# Patient Record
Sex: Male | Born: 1950 | Race: White | Hispanic: No | Marital: Married | Smoking: Never smoker
Health system: Southern US, Community
[De-identification: ages and names within clinical notes are randomized; demographics above are authoritative.]

## PROBLEM LIST (undated history)

## (undated) DIAGNOSIS — E785 Hyperlipidemia, unspecified: Secondary | ICD-10-CM

## (undated) DIAGNOSIS — Z86718 Personal history of other venous thrombosis and embolism: Secondary | ICD-10-CM

## (undated) DIAGNOSIS — I429 Cardiomyopathy, unspecified: Secondary | ICD-10-CM

## (undated) DIAGNOSIS — I2510 Atherosclerotic heart disease of native coronary artery without angina pectoris: Secondary | ICD-10-CM

## (undated) DIAGNOSIS — K602 Anal fissure, unspecified: Secondary | ICD-10-CM

## (undated) DIAGNOSIS — E119 Type 2 diabetes mellitus without complications: Secondary | ICD-10-CM

## (undated) DIAGNOSIS — I10 Essential (primary) hypertension: Secondary | ICD-10-CM

## (undated) DIAGNOSIS — G629 Polyneuropathy, unspecified: Secondary | ICD-10-CM

## (undated) DIAGNOSIS — G4730 Sleep apnea, unspecified: Secondary | ICD-10-CM

## (undated) DIAGNOSIS — Z87898 Personal history of other specified conditions: Secondary | ICD-10-CM

## (undated) DIAGNOSIS — J45909 Unspecified asthma, uncomplicated: Secondary | ICD-10-CM

## (undated) DIAGNOSIS — I509 Heart failure, unspecified: Secondary | ICD-10-CM

## (undated) HISTORY — DX: Personal history of other specified conditions: Z87.898

## (undated) HISTORY — DX: Atherosclerotic heart disease of native coronary artery without angina pectoris: I25.10

## (undated) HISTORY — DX: Cardiomyopathy, unspecified: I42.9

## (undated) HISTORY — DX: Personal history of other venous thrombosis and embolism: Z86.718

## (undated) HISTORY — DX: Polyneuropathy, unspecified: G62.9

## (undated) HISTORY — DX: Hyperlipidemia, unspecified: E78.5

---

## 1999-11-12 ENCOUNTER — Encounter: Payer: Self-pay | Admitting: Ophthalmology

## 1999-11-12 ENCOUNTER — Ambulatory Visit (HOSPITAL_COMMUNITY): Admission: RE | Admit: 1999-11-12 | Discharge: 1999-11-12 | Payer: Self-pay | Admitting: Ophthalmology

## 2019-09-29 ENCOUNTER — Other Ambulatory Visit: Payer: Self-pay | Admitting: Family Medicine

## 2019-09-29 ENCOUNTER — Ambulatory Visit
Admission: RE | Admit: 2019-09-29 | Discharge: 2019-09-29 | Disposition: A | Discharge status: Home / Self Care | Payer: Medicare Other | Source: Ambulatory Visit | Attending: Family Medicine | Admitting: Family Medicine

## 2019-09-29 ENCOUNTER — Other Ambulatory Visit: Payer: Self-pay

## 2019-09-29 DIAGNOSIS — G459 Transient cerebral ischemic attack, unspecified: Secondary | ICD-10-CM

## 2019-10-05 ENCOUNTER — Other Ambulatory Visit: Payer: Self-pay | Admitting: Family Medicine

## 2019-10-05 ENCOUNTER — Other Ambulatory Visit: Payer: Self-pay

## 2019-10-05 DIAGNOSIS — R220 Localized swelling, mass and lump, head: Secondary | ICD-10-CM

## 2019-10-09 ENCOUNTER — Other Ambulatory Visit: Payer: Self-pay

## 2019-10-09 ENCOUNTER — Other Ambulatory Visit: Payer: Self-pay | Admitting: Family Medicine

## 2019-10-09 ENCOUNTER — Ambulatory Visit
Admission: RE | Admit: 2019-10-09 | Discharge: 2019-10-09 | Disposition: A | Discharge status: Home / Self Care | Payer: Medicare Other | Source: Ambulatory Visit | Attending: Family Medicine | Admitting: Family Medicine

## 2019-10-09 ENCOUNTER — Other Ambulatory Visit (HOSPITAL_COMMUNITY): Payer: Self-pay | Admitting: Family Medicine

## 2019-10-09 DIAGNOSIS — R220 Localized swelling, mass and lump, head: Secondary | ICD-10-CM

## 2019-10-13 ENCOUNTER — Other Ambulatory Visit: Payer: Self-pay

## 2019-10-13 ENCOUNTER — Ambulatory Visit (HOSPITAL_COMMUNITY)
Admission: RE | Admit: 2019-10-13 | Discharge: 2019-10-13 | Disposition: A | Discharge status: Home / Self Care | Payer: Medicare Other | Source: Ambulatory Visit | Attending: Family Medicine | Admitting: Family Medicine

## 2019-10-13 DIAGNOSIS — R220 Localized swelling, mass and lump, head: Secondary | ICD-10-CM

## 2019-10-13 MED ORDER — GADOBUTROL 1 MMOL/ML IV SOLN
10.0000 mL | Freq: Once | INTRAVENOUS | Status: AC | PRN
  Administered 2019-10-13: 10 mL via INTRAVENOUS

## 2020-12-12 ENCOUNTER — Inpatient Hospital Stay (HOSPITAL_BASED_OUTPATIENT_CLINIC_OR_DEPARTMENT_OTHER)
Admission: EM | Admit: 2020-12-12 | Discharge: 2020-12-23 | DRG: 853 | Disposition: A | Discharge status: Skilled Nursing Facility | Payer: Medicare Other | Attending: Internal Medicine | Admitting: Internal Medicine

## 2020-12-12 ENCOUNTER — Emergency Department (HOSPITAL_BASED_OUTPATIENT_CLINIC_OR_DEPARTMENT_OTHER): Payer: Medicare Other

## 2020-12-12 ENCOUNTER — Encounter (HOSPITAL_BASED_OUTPATIENT_CLINIC_OR_DEPARTMENT_OTHER): Payer: Self-pay | Admitting: Emergency Medicine

## 2020-12-12 ENCOUNTER — Other Ambulatory Visit: Payer: Self-pay

## 2020-12-12 DIAGNOSIS — I4891 Unspecified atrial fibrillation: Secondary | ICD-10-CM | POA: Diagnosis present

## 2020-12-12 DIAGNOSIS — B379 Candidiasis, unspecified: Secondary | ICD-10-CM | POA: Diagnosis present

## 2020-12-12 DIAGNOSIS — Z86711 Personal history of pulmonary embolism: Secondary | ICD-10-CM

## 2020-12-12 DIAGNOSIS — G4733 Obstructive sleep apnea (adult) (pediatric): Secondary | ICD-10-CM | POA: Diagnosis present

## 2020-12-12 DIAGNOSIS — K7581 Nonalcoholic steatohepatitis (NASH): Secondary | ICD-10-CM | POA: Diagnosis present

## 2020-12-12 DIAGNOSIS — R7989 Other specified abnormal findings of blood chemistry: Secondary | ICD-10-CM

## 2020-12-12 DIAGNOSIS — B961 Klebsiella pneumoniae [K. pneumoniae] as the cause of diseases classified elsewhere: Secondary | ICD-10-CM | POA: Diagnosis present

## 2020-12-12 DIAGNOSIS — K8309 Other cholangitis: Secondary | ICD-10-CM

## 2020-12-12 DIAGNOSIS — E871 Hypo-osmolality and hyponatremia: Secondary | ICD-10-CM | POA: Diagnosis present

## 2020-12-12 DIAGNOSIS — E875 Hyperkalemia: Secondary | ICD-10-CM | POA: Diagnosis present

## 2020-12-12 DIAGNOSIS — Z880 Allergy status to penicillin: Secondary | ICD-10-CM

## 2020-12-12 DIAGNOSIS — E1143 Type 2 diabetes mellitus with diabetic autonomic (poly)neuropathy: Secondary | ICD-10-CM | POA: Diagnosis present

## 2020-12-12 DIAGNOSIS — R933 Abnormal findings on diagnostic imaging of other parts of digestive tract: Secondary | ICD-10-CM

## 2020-12-12 DIAGNOSIS — Z993 Dependence on wheelchair: Secondary | ICD-10-CM

## 2020-12-12 DIAGNOSIS — A419 Sepsis, unspecified organism: Secondary | ICD-10-CM

## 2020-12-12 DIAGNOSIS — K219 Gastro-esophageal reflux disease without esophagitis: Secondary | ICD-10-CM | POA: Diagnosis present

## 2020-12-12 DIAGNOSIS — K5900 Constipation, unspecified: Secondary | ICD-10-CM | POA: Diagnosis not present

## 2020-12-12 DIAGNOSIS — Z794 Long term (current) use of insulin: Secondary | ICD-10-CM

## 2020-12-12 DIAGNOSIS — J45909 Unspecified asthma, uncomplicated: Secondary | ICD-10-CM | POA: Diagnosis present

## 2020-12-12 DIAGNOSIS — E119 Type 2 diabetes mellitus without complications: Secondary | ICD-10-CM

## 2020-12-12 DIAGNOSIS — Z8616 Personal history of COVID-19: Secondary | ICD-10-CM

## 2020-12-12 DIAGNOSIS — I5032 Chronic diastolic (congestive) heart failure: Secondary | ICD-10-CM | POA: Diagnosis present

## 2020-12-12 DIAGNOSIS — Z87891 Personal history of nicotine dependence: Secondary | ICD-10-CM

## 2020-12-12 DIAGNOSIS — E876 Hypokalemia: Secondary | ICD-10-CM | POA: Diagnosis not present

## 2020-12-12 DIAGNOSIS — E1142 Type 2 diabetes mellitus with diabetic polyneuropathy: Secondary | ICD-10-CM | POA: Diagnosis present

## 2020-12-12 DIAGNOSIS — Z7901 Long term (current) use of anticoagulants: Secondary | ICD-10-CM

## 2020-12-12 DIAGNOSIS — N400 Enlarged prostate without lower urinary tract symptoms: Secondary | ICD-10-CM | POA: Diagnosis present

## 2020-12-12 DIAGNOSIS — A4159 Other Gram-negative sepsis: Principal | ICD-10-CM | POA: Diagnosis present

## 2020-12-12 DIAGNOSIS — K8070 Calculus of gallbladder and bile duct without cholecystitis without obstruction: Secondary | ICD-10-CM | POA: Diagnosis present

## 2020-12-12 DIAGNOSIS — R269 Unspecified abnormalities of gait and mobility: Secondary | ICD-10-CM | POA: Diagnosis present

## 2020-12-12 DIAGNOSIS — J9601 Acute respiratory failure with hypoxia: Secondary | ICD-10-CM | POA: Diagnosis present

## 2020-12-12 DIAGNOSIS — Z886 Allergy status to analgesic agent status: Secondary | ICD-10-CM

## 2020-12-12 DIAGNOSIS — R059 Cough, unspecified: Secondary | ICD-10-CM

## 2020-12-12 DIAGNOSIS — I130 Hypertensive heart and chronic kidney disease with heart failure and stage 1 through stage 4 chronic kidney disease, or unspecified chronic kidney disease: Secondary | ICD-10-CM | POA: Diagnosis present

## 2020-12-12 DIAGNOSIS — Z888 Allergy status to other drugs, medicaments and biological substances status: Secondary | ICD-10-CM

## 2020-12-12 DIAGNOSIS — K589 Irritable bowel syndrome without diarrhea: Secondary | ICD-10-CM | POA: Diagnosis present

## 2020-12-12 DIAGNOSIS — E1122 Type 2 diabetes mellitus with diabetic chronic kidney disease: Secondary | ICD-10-CM | POA: Diagnosis present

## 2020-12-12 DIAGNOSIS — E1165 Type 2 diabetes mellitus with hyperglycemia: Secondary | ICD-10-CM | POA: Diagnosis present

## 2020-12-12 DIAGNOSIS — I959 Hypotension, unspecified: Secondary | ICD-10-CM | POA: Diagnosis not present

## 2020-12-12 DIAGNOSIS — E785 Hyperlipidemia, unspecified: Secondary | ICD-10-CM | POA: Diagnosis present

## 2020-12-12 DIAGNOSIS — Z6838 Body mass index (BMI) 38.0-38.9, adult: Secondary | ICD-10-CM

## 2020-12-12 DIAGNOSIS — N1831 Chronic kidney disease, stage 3a: Secondary | ICD-10-CM | POA: Diagnosis present

## 2020-12-12 DIAGNOSIS — B3749 Other urogenital candidiasis: Secondary | ICD-10-CM | POA: Diagnosis present

## 2020-12-12 DIAGNOSIS — R6520 Severe sepsis without septic shock: Secondary | ICD-10-CM | POA: Diagnosis present

## 2020-12-12 DIAGNOSIS — N179 Acute kidney failure, unspecified: Secondary | ICD-10-CM | POA: Diagnosis present

## 2020-12-12 DIAGNOSIS — K429 Umbilical hernia without obstruction or gangrene: Secondary | ICD-10-CM | POA: Diagnosis present

## 2020-12-12 DIAGNOSIS — Z20822 Contact with and (suspected) exposure to covid-19: Secondary | ICD-10-CM | POA: Diagnosis present

## 2020-12-12 HISTORY — DX: Unspecified asthma, uncomplicated: J45.909

## 2020-12-12 HISTORY — DX: Type 2 diabetes mellitus without complications: E11.9

## 2020-12-12 HISTORY — DX: Essential (primary) hypertension: I10

## 2020-12-12 LAB — COMPREHENSIVE METABOLIC PANEL
ALT: 199 U/L — ABNORMAL HIGH (ref 0–44)
AST: 188 U/L — ABNORMAL HIGH (ref 15–41)
Albumin: 2.9 g/dL — ABNORMAL LOW (ref 3.5–5.0)
Alkaline Phosphatase: 74 U/L (ref 38–126)
Anion gap: 12 (ref 5–15)
BUN: 27 mg/dL — ABNORMAL HIGH (ref 8–23)
CO2: 22 mmol/L (ref 22–32)
Calcium: 9 mg/dL (ref 8.9–10.3)
Chloride: 97 mmol/L — ABNORMAL LOW (ref 98–111)
Creatinine, Ser: 1.57 mg/dL — ABNORMAL HIGH (ref 0.61–1.24)
GFR, Estimated: 47 mL/min — ABNORMAL LOW (ref >60)
Glucose, Bld: 278 mg/dL — ABNORMAL HIGH (ref 70–99)
Potassium: 4.6 mmol/L (ref 3.5–5.1)
Sodium: 131 mmol/L — ABNORMAL LOW (ref 135–145)
Total Bilirubin: 4.1 mg/dL — ABNORMAL HIGH (ref 0.3–1.2)
Total Protein: 6.8 g/dL (ref 6.5–8.1)

## 2020-12-12 LAB — CBC WITH DIFFERENTIAL/PLATELET
Abs Immature Granulocytes: 0.02 10*3/uL (ref 0.00–0.07)
Basophils Absolute: 0 10*3/uL (ref 0.0–0.1)
Basophils Relative: 0 %
Eosinophils Absolute: 0.1 10*3/uL (ref 0.0–0.5)
Eosinophils Relative: 1 %
HCT: 46.3 % (ref 39.0–52.0)
Hemoglobin: 14.9 g/dL (ref 13.0–17.0)
Immature Granulocytes: 0 %
Lymphocytes Relative: 8 %
Lymphs Abs: 0.6 10*3/uL — ABNORMAL LOW (ref 0.7–4.0)
MCH: 31.4 pg (ref 26.0–34.0)
MCHC: 32.2 g/dL (ref 30.0–36.0)
MCV: 97.5 fL (ref 80.0–100.0)
Monocytes Absolute: 0.2 10*3/uL (ref 0.1–1.0)
Monocytes Relative: 3 %
Neutro Abs: 5.7 10*3/uL (ref 1.7–7.7)
Neutrophils Relative %: 88 %
Platelets: 198 10*3/uL (ref 150–400)
RBC: 4.75 MIL/uL (ref 4.22–5.81)
RDW: 16.3 % — ABNORMAL HIGH (ref 11.5–15.5)
WBC: 6.5 10*3/uL (ref 4.0–10.5)
nRBC: 0 % (ref 0.0–0.2)

## 2020-12-12 LAB — LACTIC ACID, PLASMA
Lactic Acid, Venous: 2.3 mmol/L (ref 0.5–1.9)
Lactic Acid, Venous: 2.2 mmol/L (ref 0.5–1.9)

## 2020-12-12 LAB — PROTIME-INR
INR: 1.2 (ref 0.8–1.2)
Prothrombin Time: 14.9 seconds (ref 11.4–15.2)

## 2020-12-12 LAB — RESP PANEL BY RT-PCR (FLU A&B, COVID) ARPGX2
Influenza A by PCR: NEGATIVE
Influenza B by PCR: NEGATIVE
SARS Coronavirus 2 by RT PCR: NEGATIVE

## 2020-12-12 LAB — LIPASE, BLOOD: Lipase: 25 U/L (ref 11–51)

## 2020-12-12 MED ORDER — METRONIDAZOLE IN NACL 5-0.79 MG/ML-% IV SOLN
500.0000 mg | Freq: Once | INTRAVENOUS | Status: AC
  Administered 2020-12-13: 500 mg via INTRAVENOUS
  Filled 2020-12-12: qty 100

## 2020-12-12 MED ORDER — SODIUM CHLORIDE 0.9 % IV SOLN
2.0000 g | Freq: Two times a day (BID) | INTRAVENOUS | Status: DC
  Administered 2020-12-12 – 2020-12-14 (×5): 2 g via INTRAVENOUS
  Filled 2020-12-12 (×5): qty 2

## 2020-12-12 MED ORDER — ACETAMINOPHEN 650 MG RE SUPP
650.0000 mg | Freq: Once | RECTAL | Status: AC
  Administered 2020-12-12: 650 mg via RECTAL
  Filled 2020-12-12: qty 1

## 2020-12-12 MED ORDER — VANCOMYCIN HCL IN DEXTROSE 1-5 GM/200ML-% IV SOLN
1000.0000 mg | Freq: Once | INTRAVENOUS | Status: AC
  Administered 2020-12-12: 1000 mg via INTRAVENOUS
  Filled 2020-12-12: qty 200

## 2020-12-12 MED ORDER — VANCOMYCIN HCL 1500 MG/300ML IV SOLN
1500.0000 mg | INTRAVENOUS | Status: DC
  Filled 2020-12-12: qty 300

## 2020-12-12 MED ORDER — IOHEXOL 300 MG/ML  SOLN
100.0000 mL | Freq: Once | INTRAMUSCULAR | Status: AC | PRN
  Administered 2020-12-12: 100 mL via INTRAVENOUS

## 2020-12-12 MED ORDER — ONDANSETRON HCL 4 MG/2ML IJ SOLN
4.0000 mg | Freq: Once | INTRAMUSCULAR | Status: DC
  Filled 2020-12-12: qty 2

## 2020-12-12 MED ORDER — SODIUM CHLORIDE 0.9 % IV BOLUS
1000.0000 mL | Freq: Once | INTRAVENOUS | Status: AC
  Administered 2020-12-12: 1000 mL via INTRAVENOUS

## 2020-12-12 MED ORDER — SODIUM CHLORIDE 0.9 % IV SOLN: 2.0000 g | Freq: Two times a day (BID) | INTRAVENOUS | Status: DC

## 2020-12-12 NOTE — ED Notes (Signed)
Gave patient ice water; instructed to take small sips.

## 2020-12-12 NOTE — ED Provider Notes (Signed)
[facility] EMERGENCY DEPARTMENT Provider Note   CSN: [id number] Arrival date & time: 12/12/20  1932     History Chief Complaint  Patient presents with  . Abdominal Pain    Randy Liu is a 70 y.o. male.  Patient with history of gastroparesis, HTN, chronic kidney disease stage III, BPH, gout, obstructive sleep apnea, PE on Eliquis, retinal reattachment, cataract surgery --presents to the emergency department today for abdominal pain, nausea and vomiting started on 12/10/2020.  Patient has had frequent vomiting since that point including with EMS and upon arrival to the emergency department.  He was given 4 mg of Zofran in route.  Patient was found to have oxygen saturation into the 80s and was placed on 2 L nasal cannula.  No reported fevers however patient febrile on arrival to the emergency department.  He is actively coughing.  Wife confirms history, states that his urine was a bit dark 2 days ago.        Past Medical History:  Diagnosis Date  . Asthma   . Diabetes mellitus without complication (HCC)   . Hypertension     There are no problems to display for this patient.   History reviewed. No pertinent surgical history.     No family history on file.  Social History   Tobacco Use  . Smoking status: Former Games developermoker  . Smokeless tobacco: Never Used  Substance Use Topics  . Alcohol use: Not Currently  . Drug use: Never    Home Medications Prior to Admission medications   Not on File    Allergies    Metoclopramide and Allopurinol  Review of Systems   Review of Systems  Constitutional: Positive for chills and fever.  HENT: Negative for rhinorrhea and sore throat.   Eyes: Negative for redness.  Respiratory: Positive for cough and shortness of breath.   Cardiovascular: Negative for chest pain.  Gastrointestinal: Positive for abdominal pain, nausea and vomiting. Negative for diarrhea.  Genitourinary: Negative for dysuria and hematuria.   Musculoskeletal: Negative for myalgias.  Skin: Negative for rash.  Neurological: Negative for headaches.    Physical Exam Updated Vital Signs BP (!) 132/95 (BP Location: Left Arm)   Pulse (!) 101   Temp 98.7 F (37.1 C) (Oral)   Resp (!) 24   Ht 6\' 1"  (1.854 m)   Wt 123.8 kg   SpO2 97%   BMI 36.02 kg/m   Physical Exam Vitals and nursing note reviewed.  Constitutional:      Appearance: He is well-developed. He is ill-appearing.     Comments: Actively retching at time of initial exam.  HENT:     Head: Normocephalic and atraumatic.     Mouth/Throat:     Pharynx: Oropharynx is clear.  Eyes:     General:        Right eye: No discharge.        Left eye: No discharge.     Conjunctiva/sclera: Conjunctivae normal.  Cardiovascular:     Rate and Rhythm: Regular rhythm. Tachycardia present.     Heart sounds: Normal heart sounds.  Pulmonary:     Effort: Pulmonary effort is normal.     Breath sounds: Normal breath sounds.  Abdominal:     Palpations: Abdomen is soft.     Tenderness: There is generalized abdominal tenderness. There is no guarding or rebound.     Hernia: A hernia (Soft ventral hernia easily reducible) is present. Hernia is present in the ventral area.  Comments: Patient with generalized abdominal tenderness to palpation.  No focal right upper quadrant pain.  No rebound or guarding.  Musculoskeletal:     Cervical back: Normal range of motion and neck supple.  Skin:    General: Skin is warm and dry.  Neurological:     General: No focal deficit present.     Mental Status: He is alert and oriented to person, place, and time.     Comments: Patient awake, alert, conversant.     ED Results / Procedures / Treatments   Labs (all labs ordered are listed, but only abnormal results are displayed) Labs Reviewed  COMPREHENSIVE METABOLIC PANEL - Abnormal; Notable for the following components:      Result Value   Sodium 131 (*)    Chloride 97 (*)    Glucose, Bld 278  (*)    BUN 27 (*)    Creatinine, Ser 1.57 (*)    Albumin 2.9 (*)    AST 188 (*)    ALT 199 (*)    Total Bilirubin 4.1 (*)    GFR, Estimated 47 (*)    All other components within normal limits  LACTIC ACID, PLASMA - Abnormal; Notable for the following components:   Lactic Acid, Venous 2.3 (*)    All other components within normal limits  LACTIC ACID, PLASMA - Abnormal; Notable for the following components:   Lactic Acid, Venous 2.2 (*)    All other components within normal limits  CBC WITH DIFFERENTIAL/PLATELET - Abnormal; Notable for the following components:   RDW 16.3 (*)    Lymphs Abs 0.6 (*)    All other components within normal limits  RESP PANEL BY RT-PCR (FLU A&B, COVID) ARPGX2  CULTURE, BLOOD (ROUTINE X 2)  CULTURE, BLOOD (ROUTINE X 2)  URINE CULTURE  PROTIME-INR  LIPASE, BLOOD  URINALYSIS, ROUTINE W REFLEX MICROSCOPIC    EKG EKG Interpretation  Date/Time:  Thursday December 12 2020 20:06:54 EDT Ventricular Rate:  108 PR Interval:  150 QRS Duration: 106 QT Interval:  372 QTC Calculation: 499 R Axis:   -71 Text Interpretation: Sinus tachycardia Inferior infarct, old Consider anterior infarct Baseline wander in lead(s) V1 V2 V3 V4 V5 V6 Confirmed by Marianna Fuss ([id number]) on 12/12/2020 11:25:17 PM   Radiology DG Chest 2 View  Result Date: 12/12/2020 CLINICAL DATA:  Abdominal pain for several days EXAM: CHEST - 2 VIEW COMPARISON:  02/29/2020 FINDINGS: Cardiac shadow is enlarged but stable. Aortic calcifications are noted. 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 Lungs are clear. No bony abnormality is noted. 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 IMPRESSION: No acute abnormality seen. Electronically Signed   By: Alcide Clever M.D.   On: 12/12/2020 20:58   CT ABDOMEN PELVIS W CONTRAST  Result Date: 12/12/2020 CLINICAL DATA:  Generalized abdominal pain for several days EXAM:  CT ABDOMEN AND PELVIS WITH CONTRAST TECHNIQUE: Multidetector CT imaging of the abdomen and pelvis was performed using the standard protocol following bolus administration of intravenous contrast. CONTRAST:  OMNIPAQUE IOHEXOL 300 MG/ML  SOLN COMPARISON:  06/10/2019 FINDINGS: Lower chest: No acute abnormality. Hepatobiliary: Fatty infiltration of the liver is noted. The gallbladder is within normal limits. No biliary ductal dilatation is seen. Pancreas: Unremarkable. No pancreatic ductal dilatation or surrounding inflammatory changes. Spleen: Normal in size without focal abnormality. Adrenals/Urinary Tract: Adrenal glands are within normal limits. Kidneys demonstrate a normal enhancement pattern. Normal  excretion of contrast is noted bilaterally. Large exophytic cyst is noted arising from the lower pole of the left kidney measuring 8.1 cm stable in appearance from the prior exam. No renal calculi or obstructive changes are seen. Ureters are within normal limits. The bladder is well distended. Stomach/Bowel: The appendix is within normal limits. No obstructive or inflammatory changes are noted within the colon. The small bowel and stomach are within normal limits. Vascular/Lymphatic: Aortic atherosclerosis. No enlarged abdominal or pelvic lymph nodes. Reproductive: Prostate is unremarkable. Other: Small fat containing umbilical hernia is again seen. No abdominopelvic ascites. Musculoskeletal: Degenerative changes of lumbar spine are noted. IMPRESSION: Fatty liver Stable renal cysts. No acute abnormality noted. Electronically Signed   By: Alcide Clever M.D.   On: 12/12/2020 20:54    Procedures Procedures   Medications Ordered in ED Medications  ondansetron (ZOFRAN) injection 4 mg (has no administration in time range)  vancomycin (VANCOCIN) IVPB 1000 mg/200 mL premix (0 mg Intravenous Stopped 12/12/20 2225)    Followed by  vancomycin (VANCOCIN) IVPB 1000 mg/200 mL premix (1,000 mg Intravenous New Bag/Given  12/12/20 2248)  ceFEPIme (MAXIPIME) 2 g in sodium chloride 0.9 % 100 mL IVPB (2 g Intravenous New Bag/Given 12/12/20 2259)  vancomycin (VANCOREADY) IVPB 1500 mg/300 mL (has no administration in time range)  metroNIDAZOLE (FLAGYL) IVPB 500 mg (has no administration in time range)  acetaminophen (TYLENOL) suppository 650 mg (650 mg Rectal Given 12/12/20 2011)  iohexol (OMNIPAQUE) 300 MG/ML solution 100 mL (100 mLs Intravenous Contrast Given 12/12/20 2023)  sodium chloride 0.9 % bolus 1,000 mL (0 mLs Intravenous Stopped 12/12/20 2210)    ED Course  I have reviewed the triage vital signs and the nursing notes.  Pertinent labs & imaging results that were available during my care of the patient were reviewed by me and considered in my medical decision making (see chart for details).  Patient seen and examined. Work-up initiated.  Concern for sepsis given initial vital signs and temperature.  Rectal temperature 102.3 F.  Patient actively retching.  Broad-spectrum antibiotics ordered.  CT imaging of the chest and chest x-ray ordered as well as sepsis labs.  EKG reviewed.  Vital signs reviewed and are as follows: BP 129/61   Pulse (!) 109   Temp (!) 102.3 F (39.1 C) (Rectal)   Resp (!) 32   Ht 6\' 1"  (1.854 m)   Wt 123.8 kg   SpO2 97%   BMI 36.02 kg/m   Imaging reviewed.  Overall reassuring.  Mildly elevated lactate.  Treated with IV fluids.  No 30 cc/kg bolus due to MAP greater than 65 and lactate less than 4.  Patient continues to look uncomfortable but reports improved pain and nausea.  10:35 PM Spoke with Dr. regarding admission. Requests addition of Flagyl to cover for possibility of cholangitis.  Plan for admission to stepdown bed.  CRITICAL CARE Performed by: Robb Matar PA-C Total critical care time: 40 minutes Critical care time was exclusive of separately billable procedures and treating other patients. Critical care was necessary to treat or prevent imminent or  life-threatening deterioration. Critical care was time spent personally by me on the following activities: development of treatment plan with patient and/or surrogate as well as nursing, discussions with consultants, evaluation of patient's response to treatment, examination of patient, obtaining history from patient or surrogate, ordering and performing treatments and interventions, ordering and review of laboratory studies, ordering and review of radiographic studies, pulse oximetry and re-evaluation of patient's condition.  MDM Rules/Calculators/A&P                          Admit with concern for sepsis.  Liver function testing abnormal and may need further evaluation and trending.  Final Clinical Impression(s) / ED Diagnoses Final diagnoses:  Sepsis with acute organ dysfunction without septic shock, due to unspecified organism, unspecified type [facility])    Rx / DC Orders ED Discharge Orders    None       Renne Crigler, PA-C 12/12/20 2325    Milagros Loll, MD 12/13/20 [phone]

## 2020-12-12 NOTE — ED Notes (Signed)
Patient transported to CT 

## 2020-12-12 NOTE — Progress Notes (Signed)
Patient is compliant with CPAP and he wears it every night.  Patient's CPAP settings are 11 cm H2O pressure with heated humidity and nasal pillows.  Per patient's wife her husband wears nasal pillows because there were leak problems with nasal masks.

## 2020-12-12 NOTE — Progress Notes (Signed)
Pharmacy Antibiotic Note  Randy Liu is a 70 y.o. male admitted on 12/12/2020 presenting with abdominal pain and concern for sepsis, febrile, tachy, LA 2.3.  Pharmacy has been consulted for vancomycin and cefepime dosing.  Hx CKD3, unknown baseline  Plan: Vancomycin 2000 mg IV x 1, then 1500 mg IV every 24 hours (eAUC 539, Goal AUC 400-550, SCr 1.57) Cefepime 2g IV every 12 hours Monitor renal function, Cx and clinical progression to narrow Vancomycin levels as indicated  Height: 6\' 1"  (185.4 cm) Weight: 123.8 kg (273 lb) IBW/kg (Calculated) : 79.9  Temp (24hrs), Avg:100.5 F (38.1 C), Min:98.7 F (37.1 C), Max:102.3 F (39.1 C)  Recent Labs  Lab 12/12/20 1940  WBC 6.5  CREATININE 1.57*  LATICACIDVEN 2.3*    Estimated Creatinine Clearance: 59.5 mL/min (A) (by C-G formula based on SCr of 1.57 mg/dL (H)).    Allergies  Allergen Reactions  . Metoclopramide Itching    Loss of Balance; Urinary Incontinence  . Allopurinol Rash    12/14/20, PharmD Clinical Pharmacist ED Pharmacist Phone # [phone] 12/12/2020 8:25 PM

## 2020-12-12 NOTE — ED Triage Notes (Signed)
Patient arrived via [facility] c/o abdominal pain x 3 days with n/v x 2 days. Patient states abdominal pain started post dinner on 12/10/20. Per EMS patient continuous emesis since. Patient provided 4 mg zofran by EMS with no relief. Patient is AO x 4, VS w/ decreased spo2 on 2L [place], unable to stand at this time.

## 2020-12-13 ENCOUNTER — Inpatient Hospital Stay (HOSPITAL_COMMUNITY): Payer: Medicare Other

## 2020-12-13 ENCOUNTER — Encounter (HOSPITAL_COMMUNITY): Payer: Self-pay | Admitting: Internal Medicine

## 2020-12-13 DIAGNOSIS — J45909 Unspecified asthma, uncomplicated: Secondary | ICD-10-CM | POA: Diagnosis present

## 2020-12-13 DIAGNOSIS — R6520 Severe sepsis without septic shock: Secondary | ICD-10-CM | POA: Diagnosis present

## 2020-12-13 DIAGNOSIS — R7881 Bacteremia: Secondary | ICD-10-CM

## 2020-12-13 DIAGNOSIS — K589 Irritable bowel syndrome without diarrhea: Secondary | ICD-10-CM | POA: Diagnosis present

## 2020-12-13 DIAGNOSIS — I5021 Acute systolic (congestive) heart failure: Secondary | ICD-10-CM | POA: Diagnosis not present

## 2020-12-13 DIAGNOSIS — E1140 Type 2 diabetes mellitus with diabetic neuropathy, unspecified: Secondary | ICD-10-CM | POA: Diagnosis not present

## 2020-12-13 DIAGNOSIS — Z87891 Personal history of nicotine dependence: Secondary | ICD-10-CM | POA: Diagnosis not present

## 2020-12-13 DIAGNOSIS — N1832 Chronic kidney disease, stage 3b: Secondary | ICD-10-CM

## 2020-12-13 DIAGNOSIS — Z20822 Contact with and (suspected) exposure to covid-19: Secondary | ICD-10-CM | POA: Diagnosis present

## 2020-12-13 DIAGNOSIS — Z794 Long term (current) use of insulin: Secondary | ICD-10-CM | POA: Diagnosis not present

## 2020-12-13 DIAGNOSIS — I130 Hypertensive heart and chronic kidney disease with heart failure and stage 1 through stage 4 chronic kidney disease, or unspecified chronic kidney disease: Secondary | ICD-10-CM | POA: Diagnosis present

## 2020-12-13 DIAGNOSIS — N179 Acute kidney failure, unspecified: Secondary | ICD-10-CM | POA: Diagnosis present

## 2020-12-13 DIAGNOSIS — K429 Umbilical hernia without obstruction or gangrene: Secondary | ICD-10-CM | POA: Diagnosis present

## 2020-12-13 DIAGNOSIS — E871 Hypo-osmolality and hyponatremia: Secondary | ICD-10-CM

## 2020-12-13 DIAGNOSIS — I9589 Other hypotension: Secondary | ICD-10-CM

## 2020-12-13 DIAGNOSIS — K8309 Other cholangitis: Secondary | ICD-10-CM

## 2020-12-13 DIAGNOSIS — B3749 Other urogenital candidiasis: Secondary | ICD-10-CM | POA: Diagnosis present

## 2020-12-13 DIAGNOSIS — J4520 Mild intermittent asthma, uncomplicated: Secondary | ICD-10-CM

## 2020-12-13 DIAGNOSIS — R7989 Other specified abnormal findings of blood chemistry: Secondary | ICD-10-CM

## 2020-12-13 DIAGNOSIS — J9601 Acute respiratory failure with hypoxia: Secondary | ICD-10-CM | POA: Diagnosis present

## 2020-12-13 DIAGNOSIS — I5032 Chronic diastolic (congestive) heart failure: Secondary | ICD-10-CM | POA: Diagnosis present

## 2020-12-13 DIAGNOSIS — N1831 Chronic kidney disease, stage 3a: Secondary | ICD-10-CM | POA: Diagnosis present

## 2020-12-13 DIAGNOSIS — A4159 Other Gram-negative sepsis: Secondary | ICD-10-CM | POA: Diagnosis present

## 2020-12-13 DIAGNOSIS — Z8616 Personal history of COVID-19: Secondary | ICD-10-CM | POA: Diagnosis not present

## 2020-12-13 DIAGNOSIS — I4891 Unspecified atrial fibrillation: Secondary | ICD-10-CM | POA: Diagnosis present

## 2020-12-13 DIAGNOSIS — R9431 Abnormal electrocardiogram [ECG] [EKG]: Secondary | ICD-10-CM

## 2020-12-13 DIAGNOSIS — A419 Sepsis, unspecified organism: Secondary | ICD-10-CM

## 2020-12-13 DIAGNOSIS — Z86711 Personal history of pulmonary embolism: Secondary | ICD-10-CM | POA: Diagnosis not present

## 2020-12-13 DIAGNOSIS — K8050 Calculus of bile duct without cholangitis or cholecystitis without obstruction: Secondary | ICD-10-CM | POA: Diagnosis not present

## 2020-12-13 DIAGNOSIS — I10 Essential (primary) hypertension: Secondary | ICD-10-CM

## 2020-12-13 DIAGNOSIS — E119 Type 2 diabetes mellitus without complications: Secondary | ICD-10-CM

## 2020-12-13 DIAGNOSIS — Z7901 Long term (current) use of anticoagulants: Secondary | ICD-10-CM | POA: Diagnosis not present

## 2020-12-13 DIAGNOSIS — R0602 Shortness of breath: Secondary | ICD-10-CM

## 2020-12-13 DIAGNOSIS — I959 Hypotension, unspecified: Secondary | ICD-10-CM | POA: Diagnosis not present

## 2020-12-13 DIAGNOSIS — N170 Acute kidney failure with tubular necrosis: Secondary | ICD-10-CM

## 2020-12-13 DIAGNOSIS — K8070 Calculus of gallbladder and bile duct without cholecystitis without obstruction: Secondary | ICD-10-CM | POA: Diagnosis present

## 2020-12-13 DIAGNOSIS — N400 Enlarged prostate without lower urinary tract symptoms: Secondary | ICD-10-CM | POA: Diagnosis present

## 2020-12-13 DIAGNOSIS — E1143 Type 2 diabetes mellitus with diabetic autonomic (poly)neuropathy: Secondary | ICD-10-CM | POA: Diagnosis present

## 2020-12-13 DIAGNOSIS — E875 Hyperkalemia: Secondary | ICD-10-CM | POA: Diagnosis present

## 2020-12-13 DIAGNOSIS — G4733 Obstructive sleep apnea (adult) (pediatric): Secondary | ICD-10-CM | POA: Diagnosis present

## 2020-12-13 LAB — CBC
HCT: 42.1 % (ref 39.0–52.0)
Hemoglobin: 13.3 g/dL (ref 13.0–17.0)
MCH: 31.7 pg (ref 26.0–34.0)
MCHC: 31.6 g/dL (ref 30.0–36.0)
MCV: 100.2 fL — ABNORMAL HIGH (ref 80.0–100.0)
Platelets: 177 10*3/uL (ref 150–400)
RBC: 4.2 MIL/uL — ABNORMAL LOW (ref 4.22–5.81)
RDW: 16.8 % — ABNORMAL HIGH (ref 11.5–15.5)
WBC: 12.6 10*3/uL — ABNORMAL HIGH (ref 4.0–10.5)
nRBC: 0 % (ref 0.0–0.2)

## 2020-12-13 LAB — BLOOD CULTURE ID PANEL (REFLEXED) - BCID2

## 2020-12-13 LAB — BASIC METABOLIC PANEL
Anion gap: 14 (ref 5–15)
BUN: 30 mg/dL — ABNORMAL HIGH (ref 8–23)
CO2: 19 mmol/L — ABNORMAL LOW (ref 22–32)
Calcium: 8.8 mg/dL — ABNORMAL LOW (ref 8.9–10.3)
Chloride: 99 mmol/L (ref 98–111)
Creatinine, Ser: 1.81 mg/dL — ABNORMAL HIGH (ref 0.61–1.24)
GFR, Estimated: 39 mL/min — ABNORMAL LOW (ref >60)
Glucose, Bld: 298 mg/dL — ABNORMAL HIGH (ref 70–99)
Potassium: 5.1 mmol/L (ref 3.5–5.1)
Sodium: 132 mmol/L — ABNORMAL LOW (ref 135–145)

## 2020-12-13 LAB — D-DIMER, QUANTITATIVE: D-Dimer, Quant: 1.74 ug/mL-FEU — ABNORMAL HIGH (ref 0.00–0.50)

## 2020-12-13 LAB — HEPATIC FUNCTION PANEL
ALT: 163 U/L — ABNORMAL HIGH (ref 0–44)
AST: 121 U/L — ABNORMAL HIGH (ref 15–41)
Albumin: 2.7 g/dL — ABNORMAL LOW (ref 3.5–5.0)
Alkaline Phosphatase: 76 U/L (ref 38–126)
Bilirubin, Direct: 2.4 mg/dL — ABNORMAL HIGH (ref 0.0–0.2)
Indirect Bilirubin: 3.3 mg/dL — ABNORMAL HIGH (ref 0.3–0.9)
Total Bilirubin: 5.7 mg/dL — ABNORMAL HIGH (ref 0.3–1.2)
Total Protein: 5.9 g/dL — ABNORMAL LOW (ref 6.5–8.1)

## 2020-12-13 LAB — ECHOCARDIOGRAM COMPLETE
Area-P 1/2: 3.91 cm2
Height: 73 in
S' Lateral: 2.8 cm
Weight: 4437.42 oz

## 2020-12-13 LAB — GLUCOSE, CAPILLARY
Glucose-Capillary: 128 mg/dL — ABNORMAL HIGH (ref 70–99)
Glucose-Capillary: 149 mg/dL — ABNORMAL HIGH (ref 70–99)
Glucose-Capillary: 189 mg/dL — ABNORMAL HIGH (ref 70–99)
Glucose-Capillary: 225 mg/dL — ABNORMAL HIGH (ref 70–99)
Glucose-Capillary: 264 mg/dL — ABNORMAL HIGH (ref 70–99)
Glucose-Capillary: 301 mg/dL — ABNORMAL HIGH (ref 70–99)

## 2020-12-13 LAB — PROTIME-INR
INR: 1.4 — ABNORMAL HIGH (ref 0.8–1.2)
Prothrombin Time: 16.6 seconds — ABNORMAL HIGH (ref 11.4–15.2)

## 2020-12-13 LAB — IRON AND TIBC
Iron: 18 ug/dL — ABNORMAL LOW (ref 45–182)
Saturation Ratios: 7 % — ABNORMAL LOW (ref 17.9–39.5)
TIBC: 254 ug/dL (ref 250–450)
UIBC: 236 ug/dL

## 2020-12-13 LAB — MAGNESIUM: Magnesium: 2 mg/dL (ref 1.7–2.4)

## 2020-12-13 LAB — HEPATITIS PANEL, ACUTE
HCV Ab: NONREACTIVE
Hep A IgM: NONREACTIVE
Hep B C IgM: NONREACTIVE
Hepatitis B Surface Ag: NONREACTIVE

## 2020-12-13 LAB — APTT: aPTT: 30 seconds (ref 24–36)

## 2020-12-13 LAB — MRSA PCR SCREENING: MRSA by PCR: NEGATIVE

## 2020-12-13 LAB — FERRITIN: Ferritin: 203 ng/mL (ref 24–336)

## 2020-12-13 LAB — LACTIC ACID, PLASMA
Lactic Acid, Venous: 2.9 mmol/L (ref 0.5–1.9)
Lactic Acid, Venous: 3 mmol/L (ref 0.5–1.9)

## 2020-12-13 LAB — HEMOGLOBIN A1C
Hgb A1c MFr Bld: 7.9 % — ABNORMAL HIGH (ref 4.8–5.6)
Mean Plasma Glucose: 180.03 mg/dL

## 2020-12-13 MED ORDER — VANCOMYCIN HCL 1250 MG/250ML IV SOLN: 1250.0000 mg | INTRAVENOUS | Status: DC

## 2020-12-13 MED ORDER — SODIUM CHLORIDE 0.9 % IV SOLN: INTRAVENOUS | Status: DC

## 2020-12-13 MED ORDER — ALBUMIN HUMAN 25 % IV SOLN
50.0000 g | Freq: Once | INTRAVENOUS | Status: AC
  Administered 2020-12-13: 50 g via INTRAVENOUS
  Filled 2020-12-13: qty 200

## 2020-12-13 MED ORDER — CHLORHEXIDINE GLUCONATE 0.12 % MT SOLN
15.0000 mL | Freq: Two times a day (BID) | OROMUCOSAL | Status: DC
  Administered 2020-12-14 – 2020-12-23 (×19): 15 mL via OROMUCOSAL
  Filled 2020-12-13 (×18): qty 15

## 2020-12-13 MED ORDER — INSULIN ASPART 100 UNIT/ML ~~LOC~~ SOLN
0.0000 [IU] | SUBCUTANEOUS | Status: DC
  Administered 2020-12-13: 5 [IU] via SUBCUTANEOUS
  Administered 2020-12-13: 7 [IU] via SUBCUTANEOUS
  Administered 2020-12-13: 2 [IU] via SUBCUTANEOUS
  Administered 2020-12-13: 3 [IU] via SUBCUTANEOUS

## 2020-12-13 MED ORDER — PERFLUTREN LIPID MICROSPHERE
1.0000 mL | INTRAVENOUS | Status: AC | PRN
  Administered 2020-12-13: 2 mL via INTRAVENOUS
  Filled 2020-12-13: qty 10

## 2020-12-13 MED ORDER — ACETAMINOPHEN 650 MG RE SUPP: 650.0000 mg | Freq: Four times a day (QID) | RECTAL | Status: DC | PRN

## 2020-12-13 MED ORDER — ALBUTEROL SULFATE (2.5 MG/3ML) 0.083% IN NEBU: 2.5000 mg | INHALATION_SOLUTION | RESPIRATORY_TRACT | Status: DC | PRN

## 2020-12-13 MED ORDER — ALBUTEROL SULFATE (2.5 MG/3ML) 0.083% IN NEBU
2.5000 mg | INHALATION_SOLUTION | Freq: Four times a day (QID) | RESPIRATORY_TRACT | Status: DC | PRN
  Administered 2020-12-20 – 2020-12-23 (×4): 2.5 mg via RESPIRATORY_TRACT
  Filled 2020-12-13 (×4): qty 3

## 2020-12-13 MED ORDER — INSULIN ASPART 100 UNIT/ML ~~LOC~~ SOLN
0.0000 [IU] | SUBCUTANEOUS | Status: DC
  Administered 2020-12-13 (×2): 2 [IU] via SUBCUTANEOUS
  Administered 2020-12-14 – 2020-12-15 (×5): 3 [IU] via SUBCUTANEOUS
  Administered 2020-12-15: 5 [IU] via SUBCUTANEOUS
  Administered 2020-12-15: 3 [IU] via SUBCUTANEOUS
  Administered 2020-12-15: 5 [IU] via SUBCUTANEOUS
  Administered 2020-12-15 (×2): 3 [IU] via SUBCUTANEOUS
  Administered 2020-12-16: 2 [IU] via SUBCUTANEOUS
  Administered 2020-12-16 (×2): 3 [IU] via SUBCUTANEOUS

## 2020-12-13 MED ORDER — IPRATROPIUM-ALBUTEROL 0.5-2.5 (3) MG/3ML IN SOLN: 3.0000 mL | Freq: Three times a day (TID) | RESPIRATORY_TRACT | Status: DC

## 2020-12-13 MED ORDER — IPRATROPIUM-ALBUTEROL 0.5-2.5 (3) MG/3ML IN SOLN
3.0000 mL | Freq: Two times a day (BID) | RESPIRATORY_TRACT | Status: DC
  Administered 2020-12-14 – 2020-12-17 (×7): 3 mL via RESPIRATORY_TRACT
  Filled 2020-12-13 (×8): qty 3

## 2020-12-13 MED ORDER — PROCHLORPERAZINE EDISYLATE 10 MG/2ML IJ SOLN: 5.0000 mg | Freq: Four times a day (QID) | INTRAMUSCULAR | Status: DC | PRN

## 2020-12-13 MED ORDER — CHLORHEXIDINE GLUCONATE CLOTH 2 % EX PADS
6.0000 | MEDICATED_PAD | Freq: Every day | CUTANEOUS | Status: DC
  Administered 2020-12-14: 6 via TOPICAL

## 2020-12-13 MED ORDER — LACTATED RINGERS IV BOLUS
1000.0000 mL | Freq: Once | INTRAVENOUS | Status: AC
  Administered 2020-12-13: 1000 mL via INTRAVENOUS

## 2020-12-13 MED ORDER — IPRATROPIUM-ALBUTEROL 0.5-2.5 (3) MG/3ML IN SOLN
3.0000 mL | Freq: Three times a day (TID) | RESPIRATORY_TRACT | Status: DC
  Administered 2020-12-13: 3 mL via RESPIRATORY_TRACT
  Filled 2020-12-13: qty 3

## 2020-12-13 MED ORDER — ACETAMINOPHEN 325 MG PO TABS
650.0000 mg | ORAL_TABLET | Freq: Four times a day (QID) | ORAL | Status: DC | PRN
  Administered 2020-12-13 – 2020-12-20 (×4): 650 mg via ORAL
  Filled 2020-12-13 (×4): qty 2

## 2020-12-13 MED ORDER — METRONIDAZOLE IN NACL 5-0.79 MG/ML-% IV SOLN
500.0000 mg | Freq: Three times a day (TID) | INTRAVENOUS | Status: AC
  Administered 2020-12-13 – 2020-12-18 (×17): 500 mg via INTRAVENOUS
  Filled 2020-12-13 (×17): qty 100

## 2020-12-13 MED ORDER — ORAL CARE MOUTH RINSE
15.0000 mL | Freq: Two times a day (BID) | OROMUCOSAL | Status: DC
  Administered 2020-12-14 – 2020-12-20 (×7): 15 mL via OROMUCOSAL

## 2020-12-13 MED ORDER — VITAMIN K1 10 MG/ML IJ SOLN
5.0000 mg | Freq: Once | INTRAVENOUS | Status: AC
  Administered 2020-12-13: 5 mg via INTRAVENOUS
  Filled 2020-12-13: qty 0.5

## 2020-12-13 MED ORDER — GADOBUTROL 1 MMOL/ML IV SOLN
10.0000 mL | Freq: Once | INTRAVENOUS | Status: AC | PRN
  Administered 2020-12-13: 10 mL via INTRAVENOUS

## 2020-12-13 NOTE — H&P (View-Only) (Signed)
Results of MRI/MRCP reviewed, discussed the findings with patient's wife [name] Range over the phone, [phone].  Patient noted to have severe hepatic steatosis. No pericholecystic fluid, sludge in gallbladder with no biliary dilatation. Common bile duct between 6 to 7 mm without proximal common bile duct or intrahepatic tree dilatation. Pancreatic atrophy without pancreatic ductal dilatation, 12 x 12 mm cystic lesion in head of pancreas, 7 mm cystic lesion in pancreatic body.  Patient also noted to have Klebsiella pneumoniae growth in blood culture.  Despite unremarkable imaging findings concerning biliary tree, as patient has fever, abdominal pain, abnormal LFTs and bacteremia, plan to proceed with an ERCP and sphincterotomy tomorrow morning.  This was discussed with Dr. Gupta, covering ERCP services over the weekend.  Patient on IV antibiotics.  Last dose of Eliquis was last night. 

## 2020-12-13 NOTE — Progress Notes (Signed)
Chaplain engaged in an initial visit with Randy Liu and his wife, Randy Liu.  Chaplain learned that they have been married over 40 years and reside in [place], [place].  They noted how great the team has been at [facility].  They also have a great support system and a lot of people praying for Randy Liu.    Chaplain offered presence and listening.     12/13/20 1600  Clinical Encounter Type  Visited With Patient and family together  Visit Type Initial

## 2020-12-13 NOTE — Progress Notes (Signed)
PHARMACY - PHYSICIAN COMMUNICATION CRITICAL VALUE ALERT - BLOOD CULTURE IDENTIFICATION (BCID)  Randy Liu is an 70 y.o. male who presented to [facility] on 12/12/2020 with a chief complaint of abdominal pain and n/v.  He's currently on vancomycin, cefepime and flagyl for suspected acute cholangitis. 2 od 4 blood culture bottles collected on 3/31 now have GNR (BCID = [email or web address] pneumo)   Name of physician (or Provider) Contacted: Dr. Joseph Art  Current antibiotics:  vancomycin, cefepime and flagyl   Changes to prescribed antibiotics recommended:  - Per Dr. Joseph Art, d/c vancomycin. He will touch base with GI re: plan for pt and may de-esc cefepime to ceftriaxone/flagyl  Results for orders placed or performed during the hospital encounter of 12/12/20  Blood Culture ID Panel (Reflexed) (Collected: 12/12/2020  7:45 PM)  Result Value Ref Range   Enterococcus faecalis NOT DETECTED NOT DETECTED   Enterococcus Faecium NOT DETECTED NOT DETECTED   Listeria monocytogenes NOT DETECTED NOT DETECTED   Staphylococcus species NOT DETECTED NOT DETECTED   Staphylococcus aureus (BCID) NOT DETECTED NOT DETECTED   Staphylococcus epidermidis NOT DETECTED NOT DETECTED   Staphylococcus lugdunensis NOT DETECTED NOT DETECTED   Streptococcus species NOT DETECTED NOT DETECTED   Streptococcus agalactiae NOT DETECTED NOT DETECTED   Streptococcus pneumoniae NOT DETECTED NOT DETECTED   Streptococcus pyogenes NOT DETECTED NOT DETECTED   A.calcoaceticus-baumannii NOT DETECTED NOT DETECTED   Bacteroides fragilis NOT DETECTED NOT DETECTED   Enterobacterales DETECTED (A) NOT DETECTED   Enterobacter cloacae complex NOT DETECTED NOT DETECTED   Escherichia coli NOT DETECTED NOT DETECTED   Klebsiella aerogenes NOT DETECTED NOT DETECTED   Klebsiella oxytoca NOT DETECTED NOT DETECTED   Klebsiella pneumoniae DETECTED (A) NOT DETECTED   Proteus species NOT DETECTED NOT DETECTED   Salmonella species NOT DETECTED NOT DETECTED    Serratia marcescens NOT DETECTED NOT DETECTED   Haemophilus influenzae NOT DETECTED NOT DETECTED   Neisseria meningitidis NOT DETECTED NOT DETECTED   Pseudomonas aeruginosa NOT DETECTED NOT DETECTED   Stenotrophomonas maltophilia NOT DETECTED NOT DETECTED   Candida albicans NOT DETECTED NOT DETECTED   Candida auris NOT DETECTED NOT DETECTED   Candida glabrata NOT DETECTED NOT DETECTED   Candida krusei NOT DETECTED NOT DETECTED   Candida parapsilosis NOT DETECTED NOT DETECTED   Candida tropicalis NOT DETECTED NOT DETECTED   Cryptococcus neoformans/gattii NOT DETECTED NOT DETECTED   CTX-M ESBL NOT DETECTED NOT DETECTED   Carbapenem resistance IMP NOT DETECTED NOT DETECTED   Carbapenem resistance KPC NOT DETECTED NOT DETECTED   Carbapenem resistance NDM NOT DETECTED NOT DETECTED   Carbapenem resist OXA 48 LIKE NOT DETECTED NOT DETECTED   Carbapenem resistance VIM NOT DETECTED NOT DETECTED    Lucia Gaskins 12/13/2020  12:31 PM

## 2020-12-13 NOTE — Anesthesia Preprocedure Evaluation (Addendum)
Anesthesia Evaluation  Patient identified by MRN, date of birth, ID band Patient awake    Reviewed: Allergy & Precautions, NPO status , Patient's Chart, lab work & pertinent test results  Airway Mallampati: III  TM Distance: >3 FB Neck ROM: Full    Dental no notable dental hx.    Pulmonary asthma , former smoker,    Pulmonary exam normal breath sounds clear to auscultation       Cardiovascular hypertension, Pt. on home beta blockers Normal cardiovascular exam Rhythm:Regular Rate:Normal  ECG: rate 79. Normal sinus rhythm Left axis deviation  ECHO: 1. Left ventricular ejection fraction, by estimation, is 60 to 65%. The left ventricle has normal function. The left ventricle has no regional wall motion abnormalities. There is mild left ventricular hypertrophy. Left ventricular diastolic parameters are consistent with Grade II diastolic dysfunction (pseudonormalization). 2. Right ventricular systolic function is normal. The right ventricular size is normal. There is normal pulmonary artery systolic pressure. 3. The mitral valve is normal in structure. Trivial mitral valve regurgitation. No evidence of mitral stenosis. 4. The aortic valve is normal in structure. Aortic valve regurgitation is not visualized. No aortic stenosis is present. 5. The inferior vena cava is normal in size with greater than 50% respiratory variability, suggesting right atrial pressure of 3 mmHg.   Neuro/Psych negative neurological ROS  negative psych ROS   GI/Hepatic negative GI ROS, Neg liver ROS,   Endo/Other  diabetes, Insulin Dependent  Renal/GU Renal disease     Musculoskeletal Wheelchair bound   Abdominal (+) + obese,   Peds  Hematology  (+) anemia ,   Anesthesia Other Findings Cholangitis  Reproductive/Obstetrics                           Anesthesia Physical Anesthesia Plan  ASA: IV  Anesthesia Plan: General    Post-op Pain Management:    Induction: Intravenous  PONV Risk Score and Plan: 2 and Ondansetron, Dexamethasone and Treatment may vary due to age or medical condition  Airway Management Planned: Oral ETT  Additional Equipment:   Intra-op Plan:   Post-operative Plan: Extubation in OR  Informed Consent: I have reviewed the patients History and Physical, chart, labs and discussed the procedure including the risks, benefits and alternatives for the proposed anesthesia with the patient or authorized representative who has indicated his/her understanding and acceptance.     Dental advisory given  Plan Discussed with: CRNA  Anesthesia Plan Comments:       Anesthesia Quick Evaluation

## 2020-12-13 NOTE — Progress Notes (Signed)
Inpatient Diabetes Program Recommendations  AACE/ADA: New Consensus Statement on Inpatient Glycemic Control (2015)  Target Ranges:  Prepandial:   less than 140 mg/dL      Peak postprandial:   less than 180 mg/dL (1-2 hours)      Critically ill patients:  140 - 180 mg/dL   Lab Results  Component Value Date   GLUCAP 264 (H) 12/13/2020   HGBA1C 7.9 (H) 12/13/2020    Review of Glycemic Control Results for Randy Liu, Randy Liu (MRN [id number]) as of 12/13/2020 10:16  Ref. Range 12/13/2020 04:19 12/13/2020 07:31  Glucose-Capillary Latest Ref Range: 70 - 99 mg/dL 259 (H) 563 (H)   Diabetes history: DM 2 Outpatient Diabetes medications:  Invokana 50 mg daily Novolog 70/30 - 81 units q AM and 68 units in the PM Current orders for Inpatient glycemic control:  Novolog sensitive q 4 hours Inpatient Diabetes Program Recommendations:   Please consider adding Levemir 15 units bid.    Thanks,  Beryl Meager, RN, BC-ADM Inpatient Diabetes Coordinator Pager [phone] (8a-5p)

## 2020-12-13 NOTE — TOC Initial Note (Signed)
Transition of Care [facility]) - Initial/Assessment Note    Patient Details  Name: Randy Liu MRN: [id number] Date of Birth: [birth year]  Transition of Care [facility]) CM/SW Contact:    Leeroy Cha, RN Phone Number: 12/13/2020, 8:04 AM  Clinical Narrative:                 70 y.o. male with medical history significant of asthma, insulin-dependent type 2 diabetes, hypertension, hyperlipidemia, CKD stage III followed by nephrology, OSA on CPAP, history of PE on Eliquis, GERD, IBS presented to the ED via EMS with abdominal pain, nausea, and vomiting x3 days.  He was hypoxic to the 80s and placed on 2 L nasal cannula.  Rectal temperature 102.3 F.  Slightly tachycardic and tachypneic.  Not hypotensive.  Labs showing WBC 6.5, hemoglobin 14.9, platelet count 198K.  Sodium 131, potassium 4.6, chloride 97, bicarb 22, BUN 27, creatinine 1.5 (at baseline), glucose 278.  AST 188, ALT 199, alk phos 74, T bili 4.1.  Lipase normal.  Lactic acid 2.3 >2.2.  INR 1.2.  Blood culture x2 pending.  UA and urine culture pending.  SARS-CoV-2 PCR test negative.  Influenza panel negative.  MRSA PCR screen pending.  Chest x-ray showing no acute abnormality.  CT abdomen pelvis showing fatty liver and no gallbladder abnormality or biliary ductal dilatation; stable renal cysts; no acute finding. Patient was given Tylenol, cefepime, metronidazole, vancomycin, and 1 L normal saline bolus.  Patient states he has been vomiting for the past 3 days and has not been able to keep any food down.  Reports epigastric/right-sided abdominal pain only when vomiting but not otherwise.  Denies fevers.  Denies history of gallbladder or liver problems.  He is fully vaccinated against COVID including booster shot.  States he had cough previously due to bronchitis which is now significantly improved.  Denies chest pain or shortness of breath.  States he has been wheelchair-bound since his Covid infection last year.  Also reports history of PE over a  year ago for which he takes Eliquis and reports compliance. PLAN: to return to home  Expected Discharge Plan: Home/Self Care Barriers to Discharge: Continued Medical Work up   Patient Goals and CMS Choice Patient states their goals for this hospitalization and ongoing recovery are:: to go ho87m CMS Medicare.gov Compare Post Acute Care list provided to:: Patient Choice offered to / list presented to : Patient  Expected Discharge Plan and Services Expected Discharge Plan: Home/Self Care   Discharge Planning Services: CM Consult   Living arrangements for the past 2 months: Single Family Home                                      Prior Living Arrangements/Services Living arrangements for the past 2 months: Single Family Home Lives with:: Spouse Patient language and need for interpreter reviewed:: Yes Do you feel safe going back to the place where you live?: Yes      Need for Family Participation in Patient Care: No (Comment) Care giver support system in place?: No (comment)   Criminal Activity/Legal Involvement Pertinent to Current Situation/Hospitalization: No - Comment as needed  Activities of Daily Living      Permission Sought/Granted                  Emotional Assessment Appearance:: Appears stated age Attitude/Demeanor/Rapport: Engaged Affect (typically observed): Calm Orientation: : Oriented to Place,Oriented to Self,Oriented  to  Time,Oriented to Situation Alcohol / Substance Use: Not Applicable Psych Involvement: No (comment)  Admission diagnosis:  Acute cholangitis [K83.09] Sepsis with acute organ dysfunction without septic shock, due to unspecified organism, unspecified type ([place]) [A41.9, R65.20] Patient Active Problem List   Diagnosis Date Noted  . Acute hypoxemic respiratory failure ([place]) 12/13/2020  . Hyponatremia 12/13/2020  . Asthma 12/13/2020  . Diabetes ([place]) 12/13/2020  . Acute cholangitis 12/12/2020   PCP:  Burman Freestone,  MD Pharmacy:   [facility] [place] - [address] [place] [address] Phone: [phone] Fax: [phone]     Social Determinants of Health (SDOH) Interventions    Readmission Risk Interventions No flowsheet data found.

## 2020-12-13 NOTE — Progress Notes (Signed)
Results of MRI/MRCP reviewed, discussed the findings with patient's wife Randy Liu over the phone, [phone].  Patient noted to have severe hepatic steatosis. No pericholecystic fluid, sludge in gallbladder with no biliary dilatation. Common bile duct between 6 to 7 mm without proximal common bile duct or intrahepatic tree dilatation. Pancreatic atrophy without pancreatic ductal dilatation, 12 x 12 mm cystic lesion in head of pancreas, 7 mm cystic lesion in pancreatic body.  Patient also noted to have Klebsiella pneumoniae growth in blood culture.  Despite unremarkable imaging findings concerning biliary tree, as patient has fever, abdominal pain, abnormal LFTs and bacteremia, plan to proceed with an ERCP and sphincterotomy tomorrow morning.  This was discussed with Dr. Chales Abrahams, covering ERCP services over the weekend.  Patient on IV antibiotics.  Last dose of Eliquis was last night.

## 2020-12-13 NOTE — Progress Notes (Signed)
Lower extremity venous bilateral study completed.  Preliminary results relayed to Joseph Art, MD.   See CV Proc for preliminary results report.   Jean Rosenthal, RDMS, RVT

## 2020-12-13 NOTE — H&P (Signed)
History and Physical    [name] EVO:[id number] DOB: 1950/10/24 DOA: 12/12/2020  PCP: Burman Freestone, MD Patient coming from: [facility]  Chief Complaint: Abdominal pain, nausea, vomiting  HPI: Randy Liu is a 70 y.o. male with medical history significant of asthma, insulin-dependent type 2 diabetes, hypertension, hyperlipidemia, CKD stage III followed by nephrology, OSA on CPAP, history of PE on Eliquis, GERD, IBS presented to the ED via EMS with abdominal pain, nausea, and vomiting x3 days.  He was hypoxic to the 80s and placed on 2 L nasal cannula.  Rectal temperature 102.3 F.  Slightly tachycardic and tachypneic.  Not hypotensive.  Labs showing WBC 6.5, hemoglobin 14.9, platelet count 198K.  Sodium 131, potassium 4.6, chloride 97, bicarb 22, BUN 27, creatinine 1.5 (at baseline), glucose 278.  AST 188, ALT 199, alk phos 74, T bili 4.1.  Lipase normal.  Lactic acid 2.3 >2.2.  INR 1.2.  Blood culture x2 pending.  UA and urine culture pending.  SARS-CoV-2 PCR test negative.  Influenza panel negative.  MRSA PCR screen pending.  Chest x-ray showing no acute abnormality.  CT abdomen pelvis showing fatty liver and no gallbladder abnormality or biliary ductal dilatation; stable renal cysts; no acute finding. Patient was given Tylenol, cefepime, metronidazole, vancomycin, and 1 L normal saline bolus.  Patient states he has been vomiting for the past 3 days and has not been able to keep any food down.  Reports epigastric/right-sided abdominal pain only when vomiting but not otherwise.  Denies fevers.  Denies history of gallbladder or liver problems.  He is fully vaccinated against COVID including booster shot.  States he had cough previously due to bronchitis which is now significantly improved.  Denies chest pain or shortness of breath.  States he has been wheelchair-bound since his Covid infection last year.  Also reports history of PE over a year ago for which he takes Eliquis and reports  compliance.  Review of Systems:  All systems reviewed and apart from history of presenting illness, are negative.  Past Medical History:  Diagnosis Date  . Asthma   . Diabetes mellitus without complication ([place])   . Hypertension     History reviewed. No pertinent surgical history.   reports that he has quit smoking. He has never used smokeless tobacco. He reports previous alcohol use. He reports that he does not use drugs.  Allergies  Allergen Reactions  . Metoclopramide Itching    Loss of Balance; Urinary Incontinence  . Nsaids     Other reaction(s): Other (See Comments) Renal Insufficiency Kidney issues   . Amoxicillin-Pot Clavulanate Diarrhea and Nausea And Vomiting    Other reaction(s): Other (See Comments) Abdomen Pain Abdomen pain   . Statins     Other reaction(s): Other (See Comments) Myalgias and Myositis myalgias   . Allopurinol Rash  . Empagliflozin     Other reaction(s): Other (See Comments) Fainting, weakness, lightheaded, falling  . Tiotropium     Other reaction(s): Other (See Comments) Urinary retention    History reviewed. No pertinent family history.  Prior to Admission medications   Medication Sig Start Date End Date Taking? Authorizing Provider  benzonatate (TESSALON) 200 MG capsule Take 200 mg by mouth 3 (three) times daily as needed for cough. 11/18/20  Yes [provider]  carvedilol (COREG) 25 MG tablet Take 12.5 mg by mouth 2 (two) times daily. 10/31/20  Yes [provider]  Cholecalciferol 25 MCG (1000 UT) capsule Take 2,000 Units by mouth in the morning and  at bedtime.   Yes [provider]  colchicine-probenecid 0.5-500 MG tablet Take 1 tablet by mouth daily. 11/21/20  Yes [provider]  cyanocobalamin (,VITAMIN B-12,) 1000 MCG/ML injection Inject 1,000 mcg into the skin every 30 (thirty) days. 27th of month 09/23/20  Yes [provider]  ELIQUIS 5 MG TABS tablet Take 5 mg by mouth 2 (two) times  daily. 10/21/20  Yes [provider]  fenofibrate 160 MG tablet Take 160 mg by mouth daily. 12/04/20  Yes [provider]  INVOKANA 100 MG TABS tablet Take 50 mg by mouth every morning. 10/31/20  Yes [provider]  lansoprazole (PREVACID) 30 MG capsule in the morning and at bedtime. 12/02/20  Yes [provider]  latanoprost (XALATAN) 0.005 % ophthalmic solution Place 1 drop into the left eye at bedtime. 10/29/20  Yes [provider]  magnesium oxide (MAG-OX) 400 MG tablet Take 1 tablet by mouth daily. 10/09/20  Yes [provider]  meclizine (ANTIVERT) 25 MG tablet Take 25 mg by mouth 3 (three) times daily as needed for nausea.   Yes [provider]  metoCLOPramide (REGLAN) 5 MG tablet Take 2.5-5 mg by mouth at bedtime. 10/09/20  Yes [provider]  montelukast (SINGULAIR) 10 MG tablet Take 10 mg by mouth at bedtime. 09/28/20  Yes [provider]  NOVOLOG MIX 70/30 FLEXPEN (70-30) 100 UNIT/ML FlexPen Inject 68-81 Units into the skin See admin instructions. 81 units in the AM and 68 units in the evening 12/04/20  Yes [provider]  ondansetron (ZOFRAN) 8 MG tablet Take 8 mg by mouth every 8 (eight) hours as needed for nausea or vomiting.   Yes [provider]  Peppermint Oil (IBGARD) 90 MG CPCR Take 1 capsule by mouth daily.   Yes [provider]  Probiotic Product (ALIGN) 4 MG CAPS Take 1 capsule by mouth daily.   Yes [provider]    Physical Exam: Vitals:   12/13/20 0016 12/13/20 0030 12/13/20 0131 12/13/20 0134  BP: (!) 103/54 (!) 104/54    Pulse: 95 94 95   Resp: 18 (!) 23 (!) 24   Temp:  98.6 F (37 C)  99.5 F (37.5 C)  TempSrc:  Oral  Oral  SpO2: 95% 96% 95%   Weight:      Height:        Physical Exam Constitutional:      General: He is not in acute distress. HENT:     Head: Normocephalic and atraumatic.  Eyes:     Extraocular Movements: Extraocular movements  intact.     Conjunctiva/sclera: Conjunctivae normal.  Cardiovascular:     Rate and Rhythm: Normal rate and regular rhythm.     Pulses: Normal pulses.  Pulmonary:     Effort: Pulmonary effort is normal. No respiratory distress.     Breath sounds: Normal breath sounds. No wheezing or rales.  Abdominal:     General: Bowel sounds are normal.     Palpations: Abdomen is soft.     Tenderness: There is no abdominal tenderness. There is no guarding or rebound.  Musculoskeletal:     Cervical back: Normal range of motion and neck supple.     Right lower leg: Edema present.     Left lower leg: Edema present.     Comments: +4 pedal edema bilaterally  Skin:    General: Skin is warm and dry.  Neurological:     General: No focal deficit present.  Mental Status: He is alert and oriented to person, place, and time.     Labs on Admission: I have personally reviewed following labs and imaging studies  CBC: Recent Labs  Lab 12/12/20 1940  WBC 6.5  NEUTROABS 5.7  HGB 14.9  HCT 46.3  MCV 97.5  PLT 938   Basic Metabolic Panel: Recent Labs  Lab 12/12/20 1940  NA 131*  K 4.6  CL 97*  CO2 22  GLUCOSE 278*  BUN 27*  CREATININE 1.57*  CALCIUM 9.0   GFR: Estimated Creatinine Clearance: 59.5 mL/min (A) (by C-G formula based on SCr of 1.57 mg/dL (H)). Liver Function Tests: Recent Labs  Lab 12/12/20 1940  AST 188*  ALT 199*  ALKPHOS 74  BILITOT 4.1*  PROT 6.8  ALBUMIN 2.9*   Recent Labs  Lab 12/12/20 1940  LIPASE 25   No results for input(s): AMMONIA in the last 168 hours. Coagulation Profile: Recent Labs  Lab 12/12/20 1940  INR 1.2   Cardiac Enzymes: No results for input(s): CKTOTAL, CKMB, CKMBINDEX, TROPONINI in the last 168 hours. BNP (last 3 results) No results for input(s): PROBNP in the last 8760 hours. HbA1C: No results for input(s): HGBA1C in the last 72 hours. CBG: No results for input(s): GLUCAP in the last 168 hours. Lipid Profile: No results for  input(s): CHOL, HDL, LDLCALC, TRIG, CHOLHDL, LDLDIRECT in the last 72 hours. Thyroid Function Tests: No results for input(s): TSH, T4TOTAL, FREET4, T3FREE, THYROIDAB in the last 72 hours. Anemia Panel: No results for input(s): VITAMINB12, FOLATE, FERRITIN, TIBC, IRON, RETICCTPCT in the last 72 hours. Urine analysis: No results found for: COLORURINE, APPEARANCEUR, [place], [place], GLUCOSEU, [place], BILIRUBINUR, KETONESUR, PROTEINUR, UROBILINOGEN, NITRITE, LEUKOCYTESUR  Radiological Exams on Admission: DG Chest 2 View  Result Date: 12/12/2020 CLINICAL DATA:  Abdominal pain for several days EXAM: CHEST - 2 VIEW COMPARISON:  02/29/2020 FINDINGS: Cardiac shadow is enlarged but stable. Aortic calcifications are noted. 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 Lungs are clear. No bony abnormality is noted. 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 IMPRESSION: No acute abnormality seen. Electronically Signed   By: Inez Catalina M.D.   On: 12/12/2020 20:58   CT ABDOMEN PELVIS W CONTRAST  Result Date: 12/12/2020 CLINICAL DATA:  Generalized abdominal pain for several days EXAM: CT ABDOMEN AND PELVIS WITH CONTRAST TECHNIQUE: Multidetector CT imaging of the abdomen and pelvis was performed using the standard protocol following bolus administration of intravenous contrast. CONTRAST:  188m OMNIPAQUE IOHEXOL 300 MG/ML  SOLN COMPARISON:  06/10/2019 FINDINGS: Lower chest: No acute abnormality. Hepatobiliary: Fatty infiltration of the liver is noted. The gallbladder is within normal limits. No biliary ductal dilatation is seen. Pancreas: Unremarkable. No pancreatic ductal dilatation or surrounding inflammatory changes. Spleen: Normal in size without focal abnormality. Adrenals/Urinary Tract: Adrenal glands are within normal limits. Kidneys demonstrate a normal enhancement pattern. Normal excretion of contrast is noted bilaterally.  Large exophytic cyst is noted arising from the lower pole of the left kidney measuring 8.1 cm stable in appearance from the prior exam. No renal calculi or obstructive changes are seen. Ureters are within normal limits. The bladder is well distended. Stomach/Bowel: The appendix is within normal limits. No obstructive or inflammatory changes are noted within the colon. The small bowel and stomach are within normal limits. Vascular/Lymphatic: Aortic atherosclerosis. No enlarged abdominal or pelvic lymph nodes. Reproductive: Prostate is unremarkable. Other: Small fat containing umbilical  hernia is again seen. No abdominopelvic ascites. Musculoskeletal: Degenerative changes of lumbar spine are noted. IMPRESSION: Fatty liver Stable renal cysts. No acute abnormality noted. Electronically Signed   By: Inez Catalina M.D.   On: 12/12/2020 20:54    EKG: Independently reviewed.  Sinus tachycardia, QTC 499.  No acute ischemic changes.  Assessment/Plan Principal Problem:   Acute cholangitis Active Problems:   Acute hypoxemic respiratory failure (H[facility])   Hyponatremia   Asthma   Diabetes ([place])   Sepsis secondary to suspected acute cholangitis: Patient presenting with complaints of abdominal pain, nausea, and vomiting.  Transaminases and T bili elevated (AST 188, ALT 199, T bili 4.1) with normal alk phos.  Lipase normal.  Febrile, tachycardic, and tachypneic upon presentation to the ED.  Not hypotensive.  Labs showing no leukocytosis but did have mild lactic acidosis.  CT abdomen pelvis showing fatty liver and no gallbladder abnormality or biliary ductal dilatation. -Continue broad-spectrum antibiotics at this time, narrow based on culture results.  Blood culture x2 pending.  Continue to monitor WBC count.  Patient was given 1 L fluid bolus in the ED and tachycardia has now resolved, not hypotensive.  Avoiding additional fluid boluses given significant peripheral edema.  Repeat lactic acid level.  Check coags and  monitor LFTs.  Right upper quadrant ultrasound ordered.  Keep n.p.o.  [facility] has been consulted via secure chat.  Acute hypoxemic respiratory failure: Satting in the 80s on room air and was placed on 2 L supplemental oxygen in the ED. No signs of respiratory distress.  Chest x-ray showing no acute abnormality.  Not wheezing on exam.  Patient reports history of PE over a year ago for which he takes Eliquis and reports compliance. -Continue supplemental oxygen, wean as tolerated.  Check D-dimer level.  Mild hyponatremia -Received IV fluid bolus, repeat labs to check sodium level  Asthma: Stable.  No signs of acute exacerbation. -Albuterol nebulizer as needed  Insulin-dependent type 2 diabetes -Check A1c.  Sliding scale insulin sensitive every 4 hours.  Hypertension -Hold antihypertensives at this time given concern for sepsis.  CKD stage III: Stable.  Creatinine at baseline. -Continue to monitor  OSA -Continue CPAP at night  History of PE -Hold Eliquis, pending GI evaluation for possible ERCP  QT prolongation -Cardiac monitoring.  Monitor potassium and magnesium levels.  Avoid QT prolonging drugs if possible.  Repeat EKG in a.m.  DVT prophylaxis: Hold Eliquis, pending GI evaluation.  Avoid SCDs given bilateral lower extremity edema.  Checking D-dimer level, if elevated, order Dopplers to rule out DVT. Code Status: Patient wishes to be full code. Family Communication: No family available at this time. Disposition Plan: Status is: Inpatient  Remains inpatient appropriate because:Ongoing active pain requiring inpatient pain management, IV treatments appropriate due to intensity of illness or inability to take PO and Inpatient level of care appropriate due to severity of illness   Dispo: The patient is from: Home              Anticipated d/c is to: Home              Patient currently is not medically stable to d/c.   Difficult to place patient No  Level of care: Level of care:  Stepdown   The medical decision making on this patient was of high complexity and the patient is at high risk for clinical deterioration, therefore this is a level 3 visit.  Shela Leff MD [facility]  If 7PM-7AM, please contact night-coverage  [email or web address]  12/13/2020, 2:50 AM

## 2020-12-13 NOTE — Progress Notes (Signed)
Pharmacy Antibiotic Note  Randy Liu is a 70 y.o. male  With hx CKD3 presented to the ED on 12/12/2020 with c/o abdominal pain and n/v. He was started on broad abx with vancomycin, cefepime and flagyl for suspected sepsis.  Today, 12/13/2020: - day #1 abx - Tmax 102.3, wbc up 12.6, LA up 3 - scr up 1.81 (crcl~38N)  Plan: - continue cefepime 2gm IV q12h  - adjust vancomycin dose to 1250mg  IV q24h for est AUC 502 - flagyl 500mg  IV q8h - monitor renal function closely  ____________________________________________  Height: 6\' 1"  (185.4 cm) Weight: 125.8 kg (277 lb 5.4 oz) IBW/kg (Calculated) : 79.9  Temp (24hrs), Avg:99.2 F (37.3 C), Min:97.8 F (36.6 C), Max:102.3 F (39.1 C)  Recent Labs  Lab 12/12/20 1940 12/12/20 2225 12/13/20 0258 12/13/20 0541  WBC 6.5  --  12.6*  --   CREATININE 1.57*  --  1.81*  --   LATICACIDVEN 2.3* 2.2* 2.9* 3.0*    Estimated Creatinine Clearance: 52 mL/min (A) (by C-G formula based on SCr of 1.81 mg/dL (H)).    Allergies  Allergen Reactions  . Metoclopramide Itching    Loss of Balance; Urinary Incontinence  . Nsaids     Other reaction(s): Other (See Comments) Renal Insufficiency Kidney issues   . Amoxicillin-Pot Clavulanate Diarrhea and Nausea And Vomiting    Other reaction(s): Other (See Comments) Abdomen Pain Abdomen pain   . Statins     Other reaction(s): Other (See Comments) Myalgias and Myositis myalgias   . Allopurinol Rash  . Empagliflozin     Other reaction(s): Other (See Comments) Fainting, weakness, lightheaded, falling  . Tiotropium     Other reaction(s): Other (See Comments) Urinary retention     Thank you for allowing pharmacy to be a part of this patient's care.  12/14/20 12/13/2020 9:53 AM

## 2020-12-13 NOTE — Progress Notes (Signed)
PROGRESS NOTE    [name]  RCV:[id number] DOB: [birth year] DOA: 12/12/2020 PCP: Burman Freestone, MD     Brief Narrative:  Randy Liu is a 70 y.o. WM PMHx Asthma, insulin-dependent type 2 diabetes, DM neuropathy, HTN, HLD,CKD stage III followed by nephrology, OSA on CPAP, history of PE on Eliquis, GERD, IBS  Presented to the ED via EMS with abdominal pain, nausea, and vomiting x3 days.  He was hypoxic to the 80s and placed on 2 L nasal cannula.  Rectal temperature 102.3 F.  Slightly tachycardic and tachypneic.  Not hypotensive.  Labs showing WBC 6.5, hemoglobin 14.9, platelet count 198K.  Sodium 131, potassium 4.6, chloride 97, bicarb 22, BUN 27, creatinine 1.5 (at baseline), glucose 278.  AST 188, ALT 199, alk phos 74, T bili 4.1.  Lipase normal.  Lactic acid 2.3 >2.2.  INR 1.2.  Blood culture x2 pending.  UA and urine culture pending.  SARS-CoV-2 PCR test negative.  Influenza panel negative.  MRSA PCR screen pending.  Chest x-ray showing no acute abnormality.  CT abdomen pelvis showing fatty liver and no gallbladder abnormality or biliary ductal dilatation; stable renal cysts; no acute finding. Patient was given Tylenol, cefepime, metronidazole, vancomycin, and 1 L normal saline bolus.  Patient states he has been vomiting for the past 3 days and has not been able to keep any food down.  Reports epigastric/right-sided abdominal pain only when vomiting but not otherwise.  Denies fevers.  Denies history of gallbladder or liver problems.  He is fully vaccinated against COVID including booster shot.  States he had cough previously due to bronchitis which is now significantly improved.  Denies chest pain or shortness of breath.  States he has been wheelchair-bound since his Covid infection last year.  Also reports history of PE over a year ago for which he takes Eliquis and reports compliance.    Subjective: T-max overnight 39.1 C, 4, patient states not on home O2 but does use  CPAP at home.  Has chronic bilateral lower extremity edema.   Assessment & Plan: Covid vaccination; vaccinated 3/3   Principal Problem:   Acute cholangitis Active Problems:   Acute hypoxemic respiratory failure (HCC)   Hyponatremia   Asthma   Diabetes (HCC)  Severe sepsis/Acute cholangitis? -On admission patient meets criteria for severe sepsis lactic acid> 2, temp> 38 C, HR> 90, RR> 20.  Suspect set of infection acute cholangitis -Present with complaints of abdominal pain, nausea, vomiting. -Transaminases and T bili elevated (AST 188, ALT 199, T bili 4.1) with normal alk phos.  Lipase normal.   -CT abdomen; nondiagnostic see results below -Started on broad-spectrum antibiotics. -Fluid resuscitation see hypertension -Acute hepatitis panel negative -Monitor LFTs. -[place]  GI consulted will await recommendation  Acute respiratory failure with hypoxia -Initially patient required 2 L O2 on admission to maintain adequate SPO2 (SPO2 in the 80s) -4/1 still requiring supplemental O2. -4/1 titrate O2 to maintain SPO2> 90% -Incentive spirometry -Flutter valve -DuoNeb TID  Asthma:  -Stable? -Patient with extremely poor air movement     History of PE -[facility] PE protocol [facility]/29/2020 at [facility] was negative for PE -Hold Eliquis, pending GI evaluation for possible ERCP -4/1 elevated D-dimer with continued new O2 demand -4/1 VQ scan pending -Bilateral lower extremity Dopplers pending  Acute diastolic CHF -4/1 echocardiogram consistent with diastolic CHF see results below -Patient with significant bilateral pedal edema 3+.  Per patient has chronic lower extremity edema -Strict in and out -Daily weight   Bacteremia  positive Klebsiella pneumonia in aerobic and anaerobic bottles -Continue current antibiotics except for vancomycin, until GI sees patient.  Will await recommendations.  Discussed with pharmacy   Mild hyponatremia -Continue fluid resuscitation -Improving   DM  type II uncontrolled with complication -4/1 hemoglobin A1c = 7.9  -4/1 increase moderate SSI  Hypertension -4/1 patient currently hypotensive -Hold antihypertensives at this time given concern for sepsis. -4/1 albumin 50 g + normal saline173m/hr  Acute on CKD stage III: [facility]  -Previous baseline creatinine 1.48 however review of his renal function shows he labile renal function. -[facility]  -On 3/31 patient received contrast dose with CT abdomen pelvis -Trend creatinine. -4/1 UA and culture pending -Hydrate See HTN Lab Results  Component Value Date   CREATININE 1.81 (H) 12/13/2020   CREATININE 1.57 (H) 12/12/2020   OSA -Continue CPAP at night   QT prolongation -Cardiac monitoring.  - Monitor potassium and magnesium levels.   -Avoid QT prolonging drugs if possible.   -4/1 repeat EKG continued prolonged QT interval.   DVT prophylaxis:  Code Status: Full Family Communication:  Status is: Inpatient    Dispo: The patient is from: Home              Anticipated d/c is to: Home              Anticipated d/c date is: 4/8              Patient currently unstable      Consultants:  GI   Procedures/Significant Events:  3/31 CT abdomen pelvis with contrast; showing fatty liver and no gallbladder abnormality or biliary ductal dilatation.  Stable renal cysts 4/1 echocardiogram;Left Ventricle: LVEF= 60 to 65%.  -Grade II diastolic dysfunction (pseudonormalization).     I have personally reviewed and interpreted all radiology studies and my findings are as above.  VENTILATOR SETTINGS: Nasal cannula 4/1 Flow 2 L/min SPO2 97%   Cultures 3/31 blood RIGHT AC positive Klebsiella pneumonia in aerobic and anaerobic bottles 3/31 blood LEFT AC  positive Klebsiella pneumonia in aerobic and anaerobic bottles 3/31 SARS coronavirus negative 3/31 influenza A/B negative 4/1 acute hepatitis panel  negative    Antimicrobials: Anti-infectives (From admission, onward)   Start     Ordered Stop   12/13/20 2200  vancomycin (VANCOREADY) IVPB 1500 mg/300 mL  Status:  Discontinued        12/12/20 2054 12/13/20 1003   12/13/20 2200  vancomycin (VANCOREADY) IVPB 1250 mg/250 mL  Status:  Discontinued        12/13/20 1003 12/13/20 1256   12/13/20 0800  metroNIDAZOLE (FLAGYL) IVPB 500 mg        12/13/20 0241     12/12/20 2245  metroNIDAZOLE (FLAGYL) IVPB 500 mg        12/12/20 2235 12/13/20 0108   12/12/20 2200  ceFEPIme (MAXIPIME) 2 g in sodium chloride 0.9 % 100 mL IVPB  Status:  Discontinued        12/12/20 2024 12/12/20 2033   12/12/20 2130  vancomycin (VANCOCIN) IVPB 1000 mg/200 mL premix       "Followed by" Linked Group Details   12/12/20 2024 12/12/20 2348   12/12/20 2045  ceFEPIme (MAXIPIME) 2 g in sodium chloride 0.9 % 100 mL IVPB        12/12/20 2033     12/12/20 2030  vancomycin (VANCOCIN) IVPB 1000 mg/200 mL premix       "  Followed by" Linked Group Details   12/12/20 2024 12/12/20 2225       Devices    LINES / TUBES:      Continuous Infusions: . ceFEPime (MAXIPIME) IV Stopped (12/12/20 2350)  . metronidazole    . vancomycin       Objective: Vitals:   12/13/20 0300 12/13/20 0330 12/13/20 0400 12/13/20 0500  BP: (!) 175/147 104/60 (!) 110/51 (!) 104/59  Pulse: 87 87 84 79  Resp: 19 15 (!) 21 17  Temp:   99.1 F (37.3 C)   TempSrc:   Axillary   SpO2: 96% 96% 95% 96%  Weight:      Height:        Intake/Output Summary (Last 24 hours) at 12/13/2020 7062 Last data filed at 12/13/2020 0531 Gross per 24 hour  Intake 1219.41 ml  Output --  Net 1219.41 ml   Filed Weights   12/12/20 1945 12/13/20 0134  Weight: 123.8 kg 125.8 kg    Examination:  General: AOx4, positive acute respiratory distress Eyes: negative scleral hemorrhage, negative anisocoria, negative icterus ENT: Negative Runny nose, negative gingival bleeding, Neck:  Negative scars, masses,  torticollis, lymphadenopathy, JVD Lungs: diffuse decreased breath sounds bilaterally without wheezes or crackles Cardiovascular: Regular rate and rhythm without murmur gallop or rub normal S1 and S2 Abdomen: OBESE, negative abdominal pain, nondistended, positive soft, bowel sounds, no rebound, no ascites, no appreciable mass Extremities: No significant cyanosis, clubbing, or edema bilateral lower extremities Skin: Negative rashes, lesions, ulcers Psychiatric:  Negative depression, negative anxiety, negative fatigue, negative mania  Central nervous system:  Cranial nerves II through XII intact, tongue/uvula midline, all extremities muscle strength 5/5, sensation intact throughout, negative dysarthria, negative expressive aphasia, negative receptive aphasia.  .     Data Reviewed: Care during the described time interval was provided by me .  I have reviewed this patient's available data, including medical history, events of note, physical examination, and all test results as part of my evaluation.  CBC: Recent Labs  Lab 12/12/20 1940 12/13/20 0258  WBC 6.5 12.6*  NEUTROABS 5.7  --   HGB 14.9 13.3  HCT 46.3 42.1  MCV 97.5 100.2*  PLT 198 376   Basic Metabolic Panel: Recent Labs  Lab 12/12/20 1940 12/13/20 0258  NA 131* 132*  K 4.6 5.1  CL 97* 99  CO2 22 19*  GLUCOSE 278* 298*  BUN 27* 30*  CREATININE 1.57* 1.81*  CALCIUM 9.0 8.8*  MG  --  2.0   GFR: Estimated Creatinine Clearance: 52 mL/min (A) (by C-G formula based on SCr of 1.81 mg/dL (H)). Liver Function Tests: Recent Labs  Lab 12/12/20 1940 12/13/20 0258  AST 188* 121*  ALT 199* 163*  ALKPHOS 74 76  BILITOT 4.1* 5.7*  PROT 6.8 5.9*  ALBUMIN 2.9* 2.7*   Recent Labs  Lab 12/12/20 1940  LIPASE 25   No results for input(s): AMMONIA in the last 168 hours. Coagulation Profile: Recent Labs  Lab 12/12/20 1940 12/13/20 0258  INR 1.2 1.4*   Cardiac Enzymes: No results for input(s): CKTOTAL, CKMB, CKMBINDEX,  TROPONINI in the last 168 hours. BNP (last 3 results) No results for input(s): PROBNP in the last 8760 hours. HbA1C: Recent Labs    12/13/20 0258  HGBA1C 7.9*   CBG: Recent Labs  Lab 12/13/20 0419  GLUCAP 301*   Lipid Profile: No results for input(s): CHOL, HDL, LDLCALC, TRIG, CHOLHDL, LDLDIRECT in the last 72 hours. Thyroid Function Tests: No results for input(s):  TSH, T4TOTAL, FREET4, T3FREE, THYROIDAB in the last 72 hours. Anemia Panel: No results for input(s): VITAMINB12, FOLATE, FERRITIN, TIBC, IRON, RETICCTPCT in the last 72 hours. Sepsis Labs: Recent Labs  Lab 12/12/20 1940 12/12/20 2225 12/13/20 0258 12/13/20 0541  LATICACIDVEN 2.3* 2.2* 2.9* 3.0*    Recent Results (from the past 240 hour(s))  Resp Panel by RT-PCR (Flu A&B, Covid) Nasopharyngeal Swab     Status: None   Collection Time: 12/12/20  8:15 PM   Specimen: Nasopharyngeal Swab; Nasopharyngeal(NP) swabs in vial transport medium  Result Value Ref Range Status   SARS Coronavirus 2 by RT PCR NEGATIVE NEGATIVE Final    Comment: (NOTE) SARS-CoV-2 target nucleic acids are NOT DETECTED.  The SARS-CoV-2 RNA is generally detectable in upper respiratory specimens during the acute phase of infection. The lowest concentration of SARS-CoV-2 viral copies this assay can detect is 138 copies/mL. A negative result does not preclude SARS-Cov-2 infection and should not be used as the sole basis for treatment or other patient management decisions. A negative result may occur with  improper specimen collection/handling, submission of specimen other than nasopharyngeal swab, presence of viral mutation(s) within the areas targeted by this assay, and inadequate number of viral copies(<138 copies/mL). A negative result must be combined with clinical observations, patient history, and epidemiological information. The expected result is Negative.  Fact Sheet for Patients:  EntrepreneurPulse.com.au  Fact  Sheet for Healthcare Providers:  IncredibleEmployment.be  This test is no t yet approved or cleared by the [place] FDA and  has been authorized for detection and/or diagnosis of SARS-CoV-2 by FDA under an Emergency Use Authorization (EUA). This EUA will remain  in effect (meaning this test can be used) for the duration of the COVID-19 declaration under Section 564(b)(1) of the Act, 21 U.S.C.section 360bbb-3(b)(1), unless the authorization is terminated  or revoked sooner.       Influenza A by PCR NEGATIVE NEGATIVE Final   Influenza B by PCR NEGATIVE NEGATIVE Final    Comment: (NOTE) The Xpert Xpress SARS-CoV-2/FLU/RSV plus assay is intended as an aid in the diagnosis of influenza from Nasopharyngeal swab specimens and should not be used as a sole basis for treatment. Nasal washings and aspirates are unacceptable for Xpert Xpress SARS-CoV-2/FLU/RSV testing.  Fact Sheet for Patients: EntrepreneurPulse.com.au  Fact Sheet for Healthcare Providers: IncredibleEmployment.be  This test is not yet approved or cleared by the [place] FDA and has been authorized for detection and/or diagnosis of SARS-CoV-2 by FDA under an Emergency Use Authorization (EUA). This EUA will remain in effect (meaning this test can be used) for the duration of the COVID-19 declaration under Section 564(b)(1) of the Act, 21 U.S.C. section 360bbb-3(b)(1), unless the authorization is terminated or revoked.  Performed at [facility], [address] [place], [place] [address]   MRSA PCR Screening     Status: None   Collection Time: 12/13/20  1:29 AM   Specimen: Nasopharyngeal  Result Value Ref Range Status   MRSA by PCR NEGATIVE NEGATIVE Final    Comment:        The GeneXpert MRSA Assay (FDA approved for NASAL specimens only), is one component of a comprehensive MRSA colonization surveillance program. It is not intended to  diagnose MRSA infection nor to guide or monitor treatment for MRSA infections. Performed at [facility], [place] [address], [place], [place] [address]          Radiology Studies: DG Chest 2 View  Result Date:  12/12/2020 CLINICAL DATA:  Abdominal pain for several days EXAM: CHEST - 2 VIEW COMPARISON:  02/29/2020 FINDINGS: Cardiac shadow is enlarged but stable. Aortic calcifications are noted. 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 Lungs are clear. No bony abnormality is noted. 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 IMPRESSION: No acute abnormality seen. Electronically Signed   By: Inez Catalina M.D.   On: 12/12/2020 20:58   CT ABDOMEN PELVIS W CONTRAST  Result Date: 12/12/2020 CLINICAL DATA:  Generalized abdominal pain for several days EXAM: CT ABDOMEN AND PELVIS WITH CONTRAST TECHNIQUE: Multidetector CT imaging of the abdomen and pelvis was performed using the standard protocol following bolus administration of intravenous contrast. CONTRAST:  130m OMNIPAQUE IOHEXOL 300 MG/ML  SOLN COMPARISON:  06/10/2019 FINDINGS: Lower chest: No acute abnormality. Hepatobiliary: Fatty infiltration of the liver is noted. The gallbladder is within normal limits. No biliary ductal dilatation is seen. Pancreas: Unremarkable. No pancreatic ductal dilatation or surrounding inflammatory changes. Spleen: Normal in size without focal abnormality. Adrenals/Urinary Tract: Adrenal glands are within normal limits. Kidneys demonstrate a normal enhancement pattern. Normal excretion of contrast is noted bilaterally. Large exophytic cyst is noted arising from the lower pole of the left kidney measuring 8.1 cm stable in appearance from the prior exam. No renal calculi or obstructive changes are seen. Ureters are within normal limits. The bladder is well distended. Stomach/Bowel: The appendix is within normal limits.  No obstructive or inflammatory changes are noted within the colon. The small bowel and stomach are within normal limits. Vascular/Lymphatic: Aortic atherosclerosis. No enlarged abdominal or pelvic lymph nodes. Reproductive: Prostate is unremarkable. Other: Small fat containing umbilical hernia is again seen. No abdominopelvic ascites. Musculoskeletal: Degenerative changes of lumbar spine are noted. IMPRESSION: Fatty liver Stable renal cysts. No acute abnormality noted. Electronically Signed   By: MInez CatalinaM.D.   On: 12/12/2020 20:54   [place]Abdomen Limited RUQ (LIVER/GB)  Result Date: 12/13/2020 CLINICAL DATA:  Elevated LFTs EXAM: ULTRASOUND ABDOMEN LIMITED RIGHT UPPER QUADRANT COMPARISON:  CT from the previous day. FINDINGS: Gallbladder: Gallbladder is well distended with sludge within. No cholelithiasis is noted. No wall thickening or pericholecystic fluid is noted. Negative sonographic Murphy's sign is elicited. Common bile duct: Diameter: 6.5 mm. This is within normal limits for the patient's age. Liver: Mild increased echogenicity is noted consistent with fatty infiltration. No focal mass is noted. Portal vein is patent on color Doppler imaging with normal direction of blood flow towards the liver. Other: None. IMPRESSION: Gallbladder sludge without complicating factors. Fatty infiltration of the liver. Electronically Signed   By: MInez CatalinaM.D.   On: 12/13/2020 03:32        Scheduled Meds: . Chlorhexidine Gluconate Cloth  6 each Topical Daily  . insulin aspart  0-9 Units Subcutaneous Q4H   Continuous Infusions: . ceFEPime (MAXIPIME) IV Stopped (12/12/20 2350)  . metronidazole    . vancomycin       LOS: 0 days    Time spent:40 min    Itzabella Sorrels, CGeraldo Docker MD [facility]   If 7PM-7AM, please contact night-coverage 12/13/2020, 7:28 AM

## 2020-12-13 NOTE — Consult Note (Addendum)
Referring Provider: Dr John Giovanni Primary Care Physician:  Zoila Shutter, MD Primary Gastroenterologist:  Gentry Fitz  Reason for Consultation:  Elevated bilirubin  HPI: Randy Liu is a 70 y.o. male with history of DM type 2, history of PE (on Eliquis, CKD, and asthma presenting for consultation of elevated bilirubin.  Patient was in his usual state of health until 3 days ago, when he started experiencing nausea and vomiting.  He states he has not been able to tolerate any PO intake.  When he arrived at the ED, he was febrile to 102.63F (rectal). He reports dark urine for several days as well.  Denies any abdominal pain, changes in bowel habits, diarrhea, melena, or hematochezia.  Reports chronic weakness after COVID-19 infection in 08/2019.  Denies new medications.  Takes tylenol nightly.  Denies other OTC or herbal medication use.  Denies IVDU or skin tattoos.  Denies alcohol use.  Denies family history of liver disease.  Takes Eliquis, last dose last night.  Denies ASA/NSAID use.  T. Bili elevated to 5.7 today, though no biliary dilation in CT 12/12/20 nor [place] today.  Past Medical History:  Diagnosis Date  . Asthma   . Diabetes mellitus without complication (HCC)   . Hypertension     History reviewed. No pertinent surgical history.  Prior to Admission medications   Medication Sig Start Date End Date Taking? Authorizing Provider  benzonatate (TESSALON) 200 MG capsule Take 200 mg by mouth 3 (three) times daily as needed for cough. 11/18/20  Yes [provider]  carvedilol (COREG) 25 MG tablet Take 12.5 mg by mouth 2 (two) times daily. 10/31/20  Yes [provider]  Cholecalciferol 25 MCG (1000 UT) capsule Take 2,000 Units by mouth in the morning and at bedtime.   Yes [provider]  colchicine-probenecid 0.5-500 MG tablet Take 1 tablet by mouth daily. 11/21/20  Yes [provider]  cyanocobalamin (,VITAMIN B-12,) 1000 MCG/ML injection  Inject 1,000 mcg into the skin every 30 (thirty) days. 27th of month 09/23/20  Yes [provider]  ELIQUIS 5 MG TABS tablet Take 5 mg by mouth 2 (two) times daily. 10/21/20  Yes [provider]  fenofibrate 160 MG tablet Take 160 mg by mouth daily. 12/04/20  Yes [provider]  INVOKANA 100 MG TABS tablet Take 50 mg by mouth every morning. 10/31/20  Yes [provider]  lansoprazole (PREVACID) 30 MG capsule in the morning and at bedtime. 12/02/20  Yes [provider]  latanoprost (XALATAN) 0.005 % ophthalmic solution Place 1 drop into the left eye at bedtime. 10/29/20  Yes [provider]  magnesium oxide (MAG-OX) 400 MG tablet Take 1 tablet by mouth daily. 10/09/20  Yes [provider]  meclizine (ANTIVERT) 25 MG tablet Take 25 mg by mouth 3 (three) times daily as needed for nausea.   Yes [provider]  metoCLOPramide (REGLAN) 5 MG tablet Take 2.5-5 mg by mouth at bedtime. 10/09/20  Yes [provider]  montelukast (SINGULAIR) 10 MG tablet Take 10 mg by mouth at bedtime. 09/28/20  Yes [provider]  NOVOLOG MIX 70/30 FLEXPEN (70-30) 100 UNIT/ML FlexPen Inject 68-81 Units into the skin See admin instructions. 81 units in the AM and 68 units in the evening 12/04/20  Yes [provider]  ondansetron (ZOFRAN) 8 MG tablet Take 8 mg by mouth every 8 (eight) hours as needed for nausea or vomiting.   Yes [provider]  Peppermint Oil (IBGARD) 90 MG  CPCR Take 1 capsule by mouth daily.   Yes [provider]  Probiotic Product (ALIGN) 4 MG CAPS Take 1 capsule by mouth daily.   Yes [provider]    Scheduled Meds: . Chlorhexidine Gluconate Cloth  6 each Topical Daily  . insulin aspart  0-9 Units Subcutaneous Q4H   Continuous Infusions: . ceFEPime (MAXIPIME) IV Stopped (12/12/20 2350)  . metronidazole 500 mg (12/13/20 0753)  . vancomycin     PRN Meds:.acetaminophen **OR**  acetaminophen, albuterol, prochlorperazine  Allergies as of 12/12/2020 - Review Complete 12/12/2020  Allergen Reaction Noted  . Metoclopramide Itching 08/21/2016  . Allopurinol Rash 09/30/2015    History reviewed. No pertinent family history.  Social History   Socioeconomic History  . Marital status: Married    Spouse name: Not on file  . Number of children: Not on file  . Years of education: Not on file  . Highest education level: Not on file  Occupational History  . Not on file  Tobacco Use  . Smoking status: Former Games developer  . Smokeless tobacco: Never Used  Substance and Sexual Activity  . Alcohol use: Not Currently  . Drug use: Never  . Sexual activity: Not on file  Other Topics Concern  . Not on file  Social History Narrative  . Not on file   Social Determinants of Health   Financial Resource Strain: Not on file  Food Insecurity: Not on file  Transportation Needs: Not on file  Physical Activity: Not on file  Stress: Not on file  Social Connections: Not on file  Intimate Partner Violence: Not on file    Review of Systems: Review of Systems  Constitutional: Positive for fever and malaise/fatigue. Negative for weight loss.  HENT: Negative for hearing loss and tinnitus.   Eyes: Negative for pain and redness.  Respiratory: Negative for cough and shortness of breath.   Cardiovascular: Negative for chest pain and palpitations.  Gastrointestinal: Positive for nausea and vomiting. Negative for abdominal pain, blood in stool, constipation, diarrhea, heartburn and melena.  Genitourinary: Negative for flank pain and hematuria.  Musculoskeletal: Negative for falls and joint pain.  Skin: Negative for itching and rash.  Neurological: Negative for seizures and loss of consciousness.  Endo/Heme/Allergies: Negative for polydipsia. Does not bruise/bleed easily.  Psychiatric/Behavioral: Negative for substance abuse. The patient is not nervous/anxious.      Physical  Exam: Vital signs: Vitals:   12/13/20 0800 12/13/20 0900  BP: (!) 93/51 (!) 99/48  Pulse: 77 73  Resp: (!) 21 16  Temp: 97.8 F (36.6 C)   SpO2: 97% 97%   Last BM Date:  (PTA)  Physical Exam Vitals reviewed.  Constitutional:      General: He is not in acute distress. HENT:     Head: Normocephalic and atraumatic.     Nose: Nose normal. No congestion.     Mouth/Throat:     Mouth: Mucous membranes are moist.     Pharynx: Oropharynx is clear.  Eyes:     General: Scleral icterus present.     Extraocular Movements: Extraocular movements intact.  Cardiovascular:     Rate and Rhythm: Normal rate and regular rhythm.     Pulses: Normal pulses.     Heart sounds: Normal heart sounds.  Pulmonary:     Effort: Pulmonary effort is normal. No respiratory distress.     Breath sounds: Normal breath sounds.  Abdominal:     General: Bowel sounds are normal. There is no distension.  Palpations: Abdomen is soft. There is no mass.     Tenderness: There is abdominal tenderness (mild) in the right upper quadrant and epigastric area. There is no guarding or rebound.     Hernia: No hernia is present.  Musculoskeletal:        General: No swelling or tenderness.     Cervical back: Normal range of motion and neck supple.  Skin:    General: Skin is warm and dry.     Coloration: Skin is jaundiced.  Neurological:     General: No focal deficit present.     Mental Status: He is oriented to person, place, and time. He is lethargic.  Psychiatric:        Mood and Affect: Mood normal.        Behavior: Behavior normal. Behavior is cooperative.      GI:  Lab Results: Recent Labs    12/12/20 1940 12/13/20 0258  WBC 6.5 12.6*  HGB 14.9 13.3  HCT 46.3 42.1  PLT 198 177   BMET Recent Labs    12/12/20 1940 12/13/20 0258  NA 131* 132*  K 4.6 5.1  CL 97* 99  CO2 22 19*  GLUCOSE 278* 298*  BUN 27* 30*  CREATININE 1.57* 1.81*  CALCIUM 9.0 8.8*   LFT Recent Labs    12/13/20 0258   PROT 5.9*  ALBUMIN 2.7*  AST 121*  ALT 163*  ALKPHOS 76  BILITOT 5.7*  BILIDIR 2.4*  IBILI 3.3*   PT/INR Recent Labs    12/12/20 1940 12/13/20 0258  LABPROT 14.9 16.6*  INR 1.2 1.4*     Studies/Results: DG Chest 2 View  Result Date: 12/12/2020 CLINICAL DATA:  Abdominal pain for several days EXAM: CHEST - 2 VIEW COMPARISON:  02/29/2020 FINDINGS: Cardiac shadow is enlarged but stable. Aortic calcifications are noted. 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 Lungs are clear. No bony abnormality is noted. 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 IMPRESSION: No acute abnormality seen. Electronically Signed   By: Alcide Clever M.D.   On: 12/12/2020 20:58   CT ABDOMEN PELVIS W CONTRAST  Result Date: 12/12/2020 CLINICAL DATA:  Generalized abdominal pain for several days EXAM: CT ABDOMEN AND PELVIS WITH CONTRAST TECHNIQUE: Multidetector CT imaging of the abdomen and pelvis was performed using the standard protocol following bolus administration of intravenous contrast. CONTRAST:  OMNIPAQUE IOHEXOL 300 MG/ML  SOLN COMPARISON:  06/10/2019 FINDINGS: Lower chest: No acute abnormality. Hepatobiliary: Fatty infiltration of the liver is noted. The gallbladder is within normal limits. No biliary ductal dilatation is seen. Pancreas: Unremarkable. No pancreatic ductal dilatation or surrounding inflammatory changes. Spleen: Normal in size without focal abnormality. Adrenals/Urinary Tract: Adrenal glands are within normal limits. Kidneys demonstrate a normal enhancement pattern. Normal excretion of contrast is noted bilaterally. Large exophytic cyst is noted arising from the lower pole of the left kidney measuring 8.1 cm stable in appearance from the prior exam. No renal calculi or obstructive changes are seen. Ureters are within normal limits. The bladder is well distended. Stomach/Bowel: The appendix is within  normal limits. No obstructive or inflammatory changes are noted within the colon. The small bowel and stomach are within normal limits. Vascular/Lymphatic: Aortic atherosclerosis. No enlarged abdominal or pelvic lymph nodes. Reproductive: Prostate is unremarkable. Other: Small fat containing umbilical hernia is again seen. No abdominopelvic ascites. Musculoskeletal: Degenerative changes of lumbar spine are noted. IMPRESSION: Fatty  liver Stable renal cysts. No acute abnormality noted. Electronically Signed   By: Alcide CleverMark  Lukens M.D.   On: 12/12/2020 20:54   US Abdomen Limited RUQ (LIVER/GB)  Result Date: 12/13/2020 CLINICAL DATA:  Elevated LFTs EXAM: ULTRASOUND ABDOMEN LIMITED RIGHT UPPER QUADRANT COMPARISON:  CT from the previous day. FINDINGS: Gallbladder: Gallbladder is well distended with sludge within. No cholelithiasis is noted. No wall thickening or pericholecystic fluid is noted. Negative sonographic Murphy's sign is elicited. Common bile duct: Diameter: 6.5 mm. This is within normal limits for the patient's age. Liver: Mild increased echogenicity is noted consistent with fatty infiltration. No focal mass is noted. Portal vein is patent on color Doppler imaging with normal direction of blood flow towards the liver. Other: None. IMPRESSION: Gallbladder sludge without complicating factors. Fatty infiltration of the liver. Electronically Signed   By: Alcide CleverMark  Lukens M.D.   On: 12/13/2020 03:32    Impression: Elevated bilirubin:  Biliary obstruction less likely given normal CT of biliary tree, though clinical presentation concerning for cholangititis. -T. Bili 5.7/ AST 121/ ALT 163/ ALP 76 -WBCs 12.6   Hypoxic respiratory failure  A fib, on Eliquis (currently held); last dose 3/31 PM  DM type 2  AKI on CKD: BUN 30/ AKI 1.81  Plan: MRI/MRCP for further evaluation.  Unable to perform ERCP with sphincterotomy today, given Eliquis <24h ago.  Consider ERCP tomorrow, pending clinical status and MRCP  findings.  Continue IV antibiotics and IVF and hemodynamic monitoring.  Continue to trend LFTs.  Clear liquid diet OK from a GI standpoint.  Acute hepatitis panel and other liver tests ordered to rule out causes of underlying liver disease.  [facility] will follow.    LOS: 0 days   Edrick Kinslison Baron-Johnson  PA-C 12/13/2020, 9:33 AM  Contact #  [phone]

## 2020-12-13 NOTE — Plan of Care (Signed)
Pt tachypneic, BP soft 104/59 (73), NSR HR 70s, O2 96%, no acute distress, fatigued, resting comfortably in bed

## 2020-12-13 NOTE — Progress Notes (Signed)
Date and time results received: 12/13/20 0332  Test: Lactic Critical Value: 2.9  Name of Provider Notified: Night time floor coverage MD Opyd   Orders Received? Or Actions Taken?:

## 2020-12-14 ENCOUNTER — Encounter (HOSPITAL_COMMUNITY): Payer: Self-pay

## 2020-12-14 ENCOUNTER — Inpatient Hospital Stay (HOSPITAL_COMMUNITY): Payer: Medicare Other | Admitting: Anesthesiology

## 2020-12-14 ENCOUNTER — Encounter (HOSPITAL_COMMUNITY)
Admission: EM | Disposition: A | Discharge status: Skilled Nursing Facility | Payer: Self-pay | Source: Home / Self Care | Attending: Internal Medicine

## 2020-12-14 ENCOUNTER — Inpatient Hospital Stay (HOSPITAL_COMMUNITY): Payer: Medicare Other

## 2020-12-14 DIAGNOSIS — J4520 Mild intermittent asthma, uncomplicated: Secondary | ICD-10-CM | POA: Diagnosis not present

## 2020-12-14 DIAGNOSIS — K8050 Calculus of bile duct without cholangitis or cholecystitis without obstruction: Secondary | ICD-10-CM | POA: Diagnosis not present

## 2020-12-14 DIAGNOSIS — J9601 Acute respiratory failure with hypoxia: Secondary | ICD-10-CM | POA: Diagnosis not present

## 2020-12-14 DIAGNOSIS — A419 Sepsis, unspecified organism: Secondary | ICD-10-CM | POA: Diagnosis not present

## 2020-12-14 DIAGNOSIS — K8309 Other cholangitis: Secondary | ICD-10-CM | POA: Diagnosis not present

## 2020-12-14 DIAGNOSIS — R933 Abnormal findings on diagnostic imaging of other parts of digestive tract: Secondary | ICD-10-CM

## 2020-12-14 HISTORY — PX: SPHINCTEROTOMY: SHX5544

## 2020-12-14 HISTORY — PX: ENDOSCOPIC RETROGRADE CHOLANGIOPANCREATOGRAPHY (ERCP) WITH PROPOFOL: SHX5810

## 2020-12-14 HISTORY — PX: REMOVAL OF STONES: SHX5545

## 2020-12-14 HISTORY — PX: BIOPSY: SHX5522

## 2020-12-14 LAB — COMPREHENSIVE METABOLIC PANEL
ALT: 127 U/L — ABNORMAL HIGH (ref 0–44)
AST: 132 U/L — ABNORMAL HIGH (ref 15–41)
Albumin: 2.9 g/dL — ABNORMAL LOW (ref 3.5–5.0)
Alkaline Phosphatase: 66 U/L (ref 38–126)
Anion gap: 13 (ref 5–15)
BUN: 27 mg/dL — ABNORMAL HIGH (ref 8–23)
CO2: 19 mmol/L — ABNORMAL LOW (ref 22–32)
Calcium: 8.9 mg/dL (ref 8.9–10.3)
Chloride: 103 mmol/L (ref 98–111)
Creatinine, Ser: 1.34 mg/dL — ABNORMAL HIGH (ref 0.61–1.24)
GFR, Estimated: 57 mL/min — ABNORMAL LOW (ref >60)
Glucose, Bld: 150 mg/dL — ABNORMAL HIGH (ref 70–99)
Potassium: 3.9 mmol/L (ref 3.5–5.1)
Sodium: 135 mmol/L (ref 135–145)
Total Bilirubin: 5 mg/dL — ABNORMAL HIGH (ref 0.3–1.2)
Total Protein: 5.9 g/dL — ABNORMAL LOW (ref 6.5–8.1)

## 2020-12-14 LAB — URINALYSIS, ROUTINE W REFLEX MICROSCOPIC
Bacteria, UA: NONE SEEN
Bilirubin Urine: NEGATIVE
Glucose, UA: >=500 mg/dL — AB
Hgb urine dipstick: NEGATIVE
Ketones, ur: 5 mg/dL — AB
Leukocytes,Ua: NEGATIVE
Nitrite: NEGATIVE
Protein, ur: NEGATIVE mg/dL
Specific Gravity, Urine: 1.041 — ABNORMAL HIGH (ref 1.005–1.030)
pH: 5 (ref 5.0–8.0)

## 2020-12-14 LAB — CBC WITH DIFFERENTIAL/PLATELET
Abs Immature Granulocytes: 0.08 10*3/uL — ABNORMAL HIGH (ref 0.00–0.07)
Basophils Absolute: 0 10*3/uL (ref 0.0–0.1)
Basophils Relative: 0 %
Eosinophils Absolute: 0.1 10*3/uL (ref 0.0–0.5)
Eosinophils Relative: 2 %
HCT: 38.8 % — ABNORMAL LOW (ref 39.0–52.0)
Hemoglobin: 12.4 g/dL — ABNORMAL LOW (ref 13.0–17.0)
Immature Granulocytes: 1 %
Lymphocytes Relative: 11 %
Lymphs Abs: 0.7 10*3/uL (ref 0.7–4.0)
MCH: 31.8 pg (ref 26.0–34.0)
MCHC: 32 g/dL (ref 30.0–36.0)
MCV: 99.5 fL (ref 80.0–100.0)
Monocytes Absolute: 0.6 10*3/uL (ref 0.1–1.0)
Monocytes Relative: 9 %
Neutro Abs: 4.6 10*3/uL (ref 1.7–7.7)
Neutrophils Relative %: 77 %
Platelets: 138 10*3/uL — ABNORMAL LOW (ref 150–400)
RBC: 3.9 MIL/uL — ABNORMAL LOW (ref 4.22–5.81)
RDW: 16.8 % — ABNORMAL HIGH (ref 11.5–15.5)
WBC: 6.1 10*3/uL (ref 4.0–10.5)
nRBC: 0 % (ref 0.0–0.2)

## 2020-12-14 LAB — ANTI-SMOOTH MUSCLE ANTIBODY, IGG: F-Actin IgG: 22 Units — ABNORMAL HIGH (ref 0–19)

## 2020-12-14 LAB — MAGNESIUM: Magnesium: 2 mg/dL (ref 1.7–2.4)

## 2020-12-14 LAB — CERULOPLASMIN: Ceruloplasmin: 22.4 mg/dL (ref 16.0–31.0)

## 2020-12-14 LAB — PHOSPHORUS: Phosphorus: 2.1 mg/dL — ABNORMAL LOW (ref 2.5–4.6)

## 2020-12-14 LAB — MITOCHONDRIAL ANTIBODIES: Mitochondrial M2 Ab, IgG: <20 Units (ref 0.0–20.0)

## 2020-12-14 LAB — GLUCOSE, CAPILLARY
Glucose-Capillary: 155 mg/dL — ABNORMAL HIGH (ref 70–99)
Glucose-Capillary: 156 mg/dL — ABNORMAL HIGH (ref 70–99)
Glucose-Capillary: 165 mg/dL — ABNORMAL HIGH (ref 70–99)
Glucose-Capillary: 165 mg/dL — ABNORMAL HIGH (ref 70–99)
Glucose-Capillary: 166 mg/dL — ABNORMAL HIGH (ref 70–99)
Glucose-Capillary: 194 mg/dL — ABNORMAL HIGH (ref 70–99)

## 2020-12-14 LAB — ALPHA-1-ANTITRYPSIN: A-1 Antitrypsin, Ser: 216 mg/dL — ABNORMAL HIGH (ref 101–187)

## 2020-12-14 SURGERY — ENDOSCOPIC RETROGRADE CHOLANGIOPANCREATOGRAPHY (ERCP) WITH PROPOFOL
Anesthesia: General

## 2020-12-14 MED ORDER — DEXAMETHASONE SODIUM PHOSPHATE 10 MG/ML IJ SOLN
INTRAMUSCULAR | Status: DC | PRN
  Administered 2020-12-14: 5 mg via INTRAVENOUS

## 2020-12-14 MED ORDER — PROPOFOL 500 MG/50ML IV EMUL
INTRAVENOUS | Status: AC
  Filled 2020-12-14: qty 50

## 2020-12-14 MED ORDER — LIDOCAINE HCL (CARDIAC) PF 100 MG/5ML IV SOSY
PREFILLED_SYRINGE | INTRAVENOUS | Status: DC | PRN
  Administered 2020-12-14: 80 mg via INTRAVENOUS

## 2020-12-14 MED ORDER — PROPOFOL 10 MG/ML IV BOLUS
INTRAVENOUS | Status: DC | PRN
  Administered 2020-12-14: 110 mg via INTRAVENOUS

## 2020-12-14 MED ORDER — FENTANYL CITRATE (PF) 100 MCG/2ML IJ SOLN: 25.0000 ug | INTRAMUSCULAR | Status: DC | PRN

## 2020-12-14 MED ORDER — GLUCAGON HCL RDNA (DIAGNOSTIC) 1 MG IJ SOLR
INTRAMUSCULAR | Status: AC
  Filled 2020-12-14: qty 1

## 2020-12-14 MED ORDER — ROCURONIUM BROMIDE 10 MG/ML (PF) SYRINGE
PREFILLED_SYRINGE | INTRAVENOUS | Status: DC | PRN
  Administered 2020-12-14: 60 mg via INTRAVENOUS

## 2020-12-14 MED ORDER — SODIUM CHLORIDE 0.9 % IV SOLN
INTRAVENOUS | Status: DC | PRN
  Administered 2020-12-14: 25 mL

## 2020-12-14 MED ORDER — PROPOFOL 10 MG/ML IV BOLUS
INTRAVENOUS | Status: AC
  Filled 2020-12-14: qty 20

## 2020-12-14 MED ORDER — PHENYLEPHRINE 40 MCG/ML (10ML) SYRINGE FOR IV PUSH (FOR BLOOD PRESSURE SUPPORT)
PREFILLED_SYRINGE | INTRAVENOUS | Status: DC | PRN
  Administered 2020-12-14 (×3): 80 ug via INTRAVENOUS

## 2020-12-14 MED ORDER — GLUCAGON HCL RDNA (DIAGNOSTIC) 1 MG IJ SOLR
INTRAMUSCULAR | Status: DC | PRN
  Administered 2020-12-14 (×2): .2 mg via INTRAVENOUS

## 2020-12-14 MED ORDER — AMISULPRIDE (ANTIEMETIC) 5 MG/2ML IV SOLN: 10.0000 mg | Freq: Once | INTRAVENOUS | Status: DC | PRN

## 2020-12-14 MED ORDER — FENTANYL CITRATE (PF) 100 MCG/2ML IJ SOLN
INTRAMUSCULAR | Status: AC
  Filled 2020-12-14: qty 2

## 2020-12-14 MED ORDER — SUGAMMADEX SODIUM 200 MG/2ML IV SOLN
INTRAVENOUS | Status: DC | PRN
  Administered 2020-12-14: 250 mg via INTRAVENOUS
  Administered 2020-12-14: 200 mg via INTRAVENOUS

## 2020-12-14 MED ORDER — FENTANYL CITRATE (PF) 100 MCG/2ML IJ SOLN
INTRAMUSCULAR | Status: DC | PRN
  Administered 2020-12-14: 50 ug via INTRAVENOUS

## 2020-12-14 MED ORDER — SODIUM CHLORIDE 0.9 % IV SOLN: INTRAVENOUS | Status: DC

## 2020-12-14 MED ORDER — ONDANSETRON HCL 4 MG/2ML IJ SOLN
INTRAMUSCULAR | Status: DC | PRN
  Administered 2020-12-14: 4 mg via INTRAVENOUS

## 2020-12-14 MED ORDER — ONDANSETRON HCL 4 MG/2ML IJ SOLN: 4.0000 mg | Freq: Once | INTRAMUSCULAR | Status: DC | PRN

## 2020-12-14 NOTE — Transfer of Care (Signed)
Immediate Anesthesia Transfer of Care Note  Patient: Randy Liu  Procedure(s) Performed: ENDOSCOPIC RETROGRADE CHOLANGIOPANCREATOGRAPHY (ERCP) WITH PROPOFOL (N/A ) SPHINCTEROTOMY BIOPSY REMOVAL OF STONES  Patient Location: PACU  Anesthesia Type:General  Level of Consciousness: drowsy and patient cooperative  Airway & Oxygen Therapy: Patient Spontanous Breathing and Patient connected to face mask oxygen  Post-op Assessment: Report given to RN and Post -op Vital signs reviewed and stable  Post vital signs: Reviewed and stable  Last Vitals:  Vitals Value Taken Time  BP 114/60 12/14/20 0945  Temp    Pulse 66 12/14/20 0946  Resp 20 12/14/20 0946  SpO2 91 % 12/14/20 0946  Vitals shown include unvalidated device data.  Last Pain:  Vitals:   12/14/20 0733  TempSrc: Oral  PainSc: 0-No pain         Complications: No complications documented.

## 2020-12-14 NOTE — Anesthesia Postprocedure Evaluation (Signed)
Anesthesia Post Note  Patient: Randy Liu  Procedure(s) Performed: ENDOSCOPIC RETROGRADE CHOLANGIOPANCREATOGRAPHY (ERCP) WITH PROPOFOL (N/A ) SPHINCTEROTOMY BIOPSY REMOVAL OF STONES     Patient location during evaluation: PACU Anesthesia Type: General Level of consciousness: awake Pain management: pain level controlled Vital Signs Assessment: post-procedure vital signs reviewed and stable Respiratory status: spontaneous breathing, nonlabored ventilation, respiratory function stable and patient connected to nasal cannula oxygen Cardiovascular status: blood pressure returned to baseline and stable Postop Assessment: no apparent nausea or vomiting Anesthetic complications: no   No complications documented.  Last Vitals:  Vitals:   12/14/20 1249 12/14/20 1300  BP:  140/61  Pulse:  70  Resp:  19  Temp: 36.7 C   SpO2:  96%    Last Pain:  Vitals:   12/14/20 1249  TempSrc: Axillary  PainSc:                  Catheryn Bacon Willmer Fellers

## 2020-12-14 NOTE — Progress Notes (Signed)
POST ERCP NOTE:  Pt doing well. No abdominal pain or nausea or melena. Vitals stable. Abdominal Exam: soft, nontender.  Advanced diet to full liquid Discussed with pt and pt's wife. Appreciate Sx consultation   Edman Circle

## 2020-12-14 NOTE — Interval H&P Note (Signed)
History and Physical Interval Note:  12/14/2020 8:09 AM  Randy Liu  has presented today for surgery, with the diagnosis of Cholangitis.  The various methods of treatment have been discussed with the patient and family. After consideration of risks, benefits and other options for treatment, the patient has consented to  Procedure(s): ENDOSCOPIC RETROGRADE CHOLANGIOPANCREATOGRAPHY (ERCP) WITH PROPOFOL (N/A) as a surgical intervention.  The patient's history has been reviewed, patient examined, no change in status, stable for surgery.  I have reviewed the patient's chart and labs.  Questions were answered to the patient's satisfaction.     Lynann Bologna

## 2020-12-14 NOTE — Consult Note (Signed)
Reason for Consult:abd pain Referring Physician: Dr. Eusebio FriendlyWoods  Randy Liu is an 70 y.o. male.  HPI: The patient is a 70 year old white male who has been sick off and on for the last few weeks.  His family states that he has had some nausea and vomiting and abdominal pain.  On presentation to the emergency department he was found to have fever jaundice and right upper quadrant pain.  He was felt to have a sending cholangitis.  He was treated with broad-spectrum antibiotic therapy and has improved.  He underwent ERCP earlier today to clear stones from his common bile duct.  He was noted on work-up to also have stones in the gallbladder.  We are consulted to consider removing the gallbladder at some point.  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  Past Medical History:  Diagnosis Date  . Asthma   . Diabetes mellitus without complication (HCC)   . Hypertension     History reviewed. No pertinent surgical history.  History reviewed. No pertinent family history.  Social History:  reports that he has quit smoking. He has never used smokeless tobacco. He reports previous alcohol use. He reports that he does not use drugs.  Allergies:  Allergies  Allergen Reactions  . Metoclopramide Itching    Loss of Balance; Urinary Incontinence  . Nsaids     Other reaction(s): Other (See Comments) Renal Insufficiency Kidney issues   . Amoxicillin-Pot Clavulanate Diarrhea and Nausea And Vomiting    Other reaction(s): Other (See Comments) Abdomen Pain Abdomen pain   . Statins     Other reaction(s): Other (See Comments) Myalgias and Myositis myalgias   . Allopurinol Rash  . Empagliflozin     Other reaction(s): Other (See Comments) Fainting, weakness, lightheaded, falling  . Tiotropium     Other reaction(s): Other (See Comments) Urinary retention    Medications: I have reviewed the patient's current medications.  Results for orders placed or performed during the  hospital encounter of 12/12/20 (from the past 48 hour(s))  Comprehensive metabolic panel     Status: Abnormal   Collection Time: 12/12/20  7:40 PM  Result Value Ref Range   Sodium 131 (L) 135 - 145 mmol/L   Potassium 4.6 3.5 - 5.1 mmol/L   Chloride 97 (L) 98 - 111 mmol/L   CO2 22 22 - 32 mmol/L   Glucose, Bld 278 (H) 70 - 99 mg/dL    Comment: Glucose reference range applies only to samples taken after fasting for at least 8 hours.   BUN 27 (H) 8 - 23 mg/dL   Creatinine, Ser 1.611.57 (H) 0.61 - 1.24 mg/dL   Calcium 9.0 8.9 - 09.610.3 mg/dL   Total Protein 6.8 6.5 - 8.1 g/dL   Albumin 2.9 (L) 3.5 - 5.0 g/dL   AST 045188 (H) 15 - 41 U/L   ALT 199 (H) 0 - 44 U/L   Alkaline Phosphatase 74 38 - 126 U/L   Total Bilirubin 4.1 (H) 0.3 - 1.2 mg/dL   GFR, Estimated 47 (L) >60 mL/min    Comment: (NOTE) Calculated using the CKD-EPI Creatinine Equation (2021)    Anion gap 12 5 - 15    Comment: Performed at [facility], [address] [place], [place] [address]  Lactic acid, plasma     Status: Abnormal   Collection Time: 12/12/20  7:40 PM  Result Value Ref Range   Lactic Acid, Venous 2.3 (HH) 0.5 - 1.9 mmol/L  Comment: CRITICAL RESULT CALLED TO, READ BACK BY AND VERIFIED WITH: K.NEAL AT 2017 ON 12/12/20 BY K.SHAW Performed at [facility], [address] [place], [place] [address]   CBC with Differential     Status: Abnormal   Collection Time: 12/12/20  7:40 PM  Result Value Ref Range   WBC 6.5 4.0 - 10.5 K/uL   RBC 4.75 4.22 - 5.81 MIL/uL   Hemoglobin 14.9 13.0 - 17.0 g/dL   HCT 60.4 54.0 - 98.1 %   MCV 97.5 80.0 - 100.0 fL   MCH 31.4 26.0 - 34.0 pg   MCHC 32.2 30.0 - 36.0 g/dL   RDW 19.1 (H) 47.8 - 29.5 %   Platelets 198 150 - 400 K/uL   nRBC 0.0 0.0 - 0.2 %   Neutrophils Relative % 88 %   Neutro Abs 5.7 1.7 - 7.7 K/uL   Lymphocytes Relative 8 %   Lymphs Abs 0.6 (L) 0.7 - 4.0 K/uL   Monocytes Relative 3 %   Monocytes Absolute 0.2 0.1 - 1.0 K/uL   Eosinophils  Relative 1 %   Eosinophils Absolute 0.1 0.0 - 0.5 K/uL   Basophils Relative 0 %   Basophils Absolute 0.0 0.0 - 0.1 K/uL   Immature Granulocytes 0 %   Abs Immature Granulocytes 0.02 0.00 - 0.07 K/uL    Comment: Performed at [facility], [address] [place], [place] [address]  Protime-INR     Status: None   Collection Time: 12/12/20  7:40 PM  Result Value Ref Range   Prothrombin Time 14.9 11.4 - 15.2 seconds   INR 1.2 0.8 - 1.2    Comment: (NOTE) INR goal varies based on device and disease states. Performed at [facility], [address] [place], [place] [address]   Lipase, blood     Status: None   Collection Time: 12/12/20  7:40 PM  Result Value Ref Range   Lipase 25 11 - 51 U/L    Comment: Performed at [facility], [address] [place], [place] [address]  Culture, blood (Routine x 2)     Status: Abnormal (Preliminary result)   Collection Time: 12/12/20  7:45 PM   Specimen: BLOOD  Result Value Ref Range   Specimen Description      BLOOD RIGHT ANTECUBITAL Performed at [facility], [address] [place], [place] [address]    Special Requests      BOTTLES DRAWN AEROBIC AND ANAEROBIC Blood Culture adequate volume Performed at [facility], [address] [place], [place] [address]    Culture  Setup Time      GRAM NEGATIVE RODS IN BOTH AEROBIC AND ANAEROBIC BOTTLES CRITICAL RESULT CALLED TO, READ BACK BY AND VERIFIED WITH: N GLOGOVAC @ 1153 12/13/20 EB    Culture (A)     KLEBSIELLA PNEUMONIAE SUSCEPTIBILITIES TO FOLLOW Performed at [facility], [address], [place], [place] [address]    Report Status PENDING   Blood Culture ID Panel (Reflexed)     Status: Abnormal   Collection Time: 12/12/20  7:45 PM  Result Value Ref Range   Enterococcus faecalis NOT DETECTED NOT DETECTED   Enterococcus Faecium NOT DETECTED NOT DETECTED   Listeria monocytogenes NOT DETECTED NOT DETECTED   Staphylococcus  species NOT DETECTED NOT DETECTED   Staphylococcus aureus (BCID) NOT DETECTED NOT DETECTED   Staphylococcus epidermidis NOT DETECTED NOT DETECTED  Staphylococcus lugdunensis NOT DETECTED NOT DETECTED   Streptococcus species NOT DETECTED NOT DETECTED   Streptococcus agalactiae NOT DETECTED NOT DETECTED   Streptococcus pneumoniae NOT DETECTED NOT DETECTED   Streptococcus pyogenes NOT DETECTED NOT DETECTED   A.calcoaceticus-baumannii NOT DETECTED NOT DETECTED   Bacteroides fragilis NOT DETECTED NOT DETECTED   Enterobacterales DETECTED (A) NOT DETECTED    Comment: Enterobacterales represent a large order of gram negative bacteria, not a single organism. CRITICAL RESULT CALLED TO, READ BACK BY AND VERIFIED WITH: N GLOGOVAC @ 1153 12/13/20 EB    Enterobacter cloacae complex NOT DETECTED NOT DETECTED   Escherichia coli NOT DETECTED NOT DETECTED   Klebsiella aerogenes NOT DETECTED NOT DETECTED   Klebsiella oxytoca NOT DETECTED NOT DETECTED   Klebsiella pneumoniae DETECTED (A) NOT DETECTED    Comment: CRITICAL RESULT CALLED TO, READ BACK BY AND VERIFIED WITH: N GLOGOVAC @ 1153 12/13/20 EB    Proteus species NOT DETECTED NOT DETECTED   Salmonella species NOT DETECTED NOT DETECTED   Serratia marcescens NOT DETECTED NOT DETECTED   Haemophilus influenzae NOT DETECTED NOT DETECTED   Neisseria meningitidis NOT DETECTED NOT DETECTED   Pseudomonas aeruginosa NOT DETECTED NOT DETECTED   Stenotrophomonas maltophilia NOT DETECTED NOT DETECTED   Candida albicans NOT DETECTED NOT DETECTED   Candida auris NOT DETECTED NOT DETECTED   Candida glabrata NOT DETECTED NOT DETECTED   Candida krusei NOT DETECTED NOT DETECTED   Candida parapsilosis NOT DETECTED NOT DETECTED   Candida tropicalis NOT DETECTED NOT DETECTED   Cryptococcus neoformans/gattii NOT DETECTED NOT DETECTED   CTX-M ESBL NOT DETECTED NOT DETECTED   Carbapenem resistance IMP NOT DETECTED NOT DETECTED   Carbapenem resistance KPC NOT DETECTED  NOT DETECTED   Carbapenem resistance NDM NOT DETECTED NOT DETECTED   Carbapenem resist OXA 48 LIKE NOT DETECTED NOT DETECTED   Carbapenem resistance VIM NOT DETECTED NOT DETECTED    Comment: Performed at [facility], [address], [place], [place] [address]  Resp Panel by RT-PCR (Flu A&B, Covid) Nasopharyngeal Swab     Status: None   Collection Time: 12/12/20  8:15 PM   Specimen: Nasopharyngeal Swab; Nasopharyngeal(NP) swabs in vial transport medium  Result Value Ref Range   SARS Coronavirus 2 by RT PCR NEGATIVE NEGATIVE    Comment: (NOTE) SARS-CoV-2 target nucleic acids are NOT DETECTED.  The SARS-CoV-2 RNA is generally detectable in upper respiratory specimens during the acute phase of infection. The lowest concentration of SARS-CoV-2 viral copies this assay can detect is 138 copies/mL. A negative result does not preclude SARS-Cov-2 infection and should not be used as the sole basis for treatment or other patient management decisions. A negative result may occur with  improper specimen collection/handling, submission of specimen other than nasopharyngeal swab, presence of viral mutation(s) within the areas targeted by this assay, and inadequate number of viral copies(<138 copies/mL). A negative result must be combined with clinical observations, patient history, and epidemiological information. The expected result is Negative.  Fact Sheet for Patients:  BloggerCourse.com  Fact Sheet for Healthcare Providers:  SeriousBroker.it  This test is no t yet approved or cleared by the [place] FDA and  has been authorized for detection and/or diagnosis of SARS-CoV-2 by FDA under an Emergency Use Authorization (EUA). This EUA will remain  in effect (meaning this test can be used) for the duration of the COVID-19 declaration under Section 564(b)(1) of the Act, 21 U.S.C.section 360bbb-3(b)(1), unless the authorization is  terminated  or revoked sooner.  Influenza A by PCR NEGATIVE NEGATIVE   Influenza B by PCR NEGATIVE NEGATIVE    Comment: (NOTE) The Xpert Xpress SARS-CoV-2/FLU/RSV plus assay is intended as an aid in the diagnosis of influenza from Nasopharyngeal swab specimens and should not be used as a sole basis for treatment. Nasal washings and aspirates are unacceptable for Xpert Xpress SARS-CoV-2/FLU/RSV testing.  Fact Sheet for Patients: BloggerCourse.com  Fact Sheet for Healthcare Providers: SeriousBroker.it  This test is not yet approved or cleared by the [place] FDA and has been authorized for detection and/or diagnosis of SARS-CoV-2 by FDA under an Emergency Use Authorization (EUA). This EUA will remain in effect (meaning this test can be used) for the duration of the COVID-19 declaration under Section 564(b)(1) of the Act, 21 U.S.C. section 360bbb-3(b)(1), unless the authorization is terminated or revoked.  Performed at [facility], [address] [place], [place] [address]   Culture, blood (Routine x 2)     Status: None (Preliminary result)   Collection Time: 12/12/20  9:20 PM   Specimen: BLOOD  Result Value Ref Range   Specimen Description      BLOOD Blood Culture adequate volume Performed at [facility], [address] [place], [place] [address]    Special Requests      BOTTLES DRAWN AEROBIC AND ANAEROBIC LEFT ANTECUBITAL Performed at [facility], [address] [place], [place] [address]    Culture  Setup Time      GRAM NEGATIVE RODS IN BOTH AEROBIC AND ANAEROBIC BOTTLES CRITICAL VALUE NOTED.  VALUE IS CONSISTENT WITH PREVIOUSLY REPORTED AND CALLED VALUE. Performed at [facility], [address], [place], [place] [address]    Culture GRAM NEGATIVE RODS    Report Status PENDING   Lactic acid, plasma     Status: Abnormal   Collection Time: 12/12/20 10:25 PM   Result Value Ref Range   Lactic Acid, Venous 2.2 (HH) 0.5 - 1.9 mmol/L    Comment: CRITICAL RESULT CALLED TO, READ BACK BY AND VERIFIED WITH: Waldo Laine RN AT 2325 ON 12/12/20 BY I.SUGUT Performed at [facility], [address] [place], [place] [address]   MRSA PCR Screening     Status: None   Collection Time: 12/13/20  1:29 AM   Specimen: Nasopharyngeal  Result Value Ref Range   MRSA by PCR NEGATIVE NEGATIVE    Comment:        The GeneXpert MRSA Assay (FDA approved for NASAL specimens only), is one component of a comprehensive MRSA colonization surveillance program. It is not intended to diagnose MRSA infection nor to guide or monitor treatment for MRSA infections. Performed at [facility], [address], [place], [place] [address]   Hemoglobin A1c     Status: Abnormal   Collection Time: 12/13/20  2:58 AM  Result Value Ref Range   Hgb A1c MFr Bld 7.9 (H) 4.8 - 5.6 %    Comment: (NOTE) Pre diabetes:          5.7%-6.4%  Diabetes:              >6.4%  Glycemic control for   <7.0% adults with diabetes    Mean Plasma Glucose 180.03 mg/dL    Comment: Performed at [facility], [address] [place], [place] [address]  Magnesium     Status: None   Collection Time: 12/13/20  2:58 AM  Result Value Ref Range  Magnesium 2.0 1.7 - 2.4 mg/dL    Comment: Performed at [facility], [address], [place], [place] [address]  CBC     Status: Abnormal   Collection Time: 12/13/20  2:58 AM  Result Value Ref Range   WBC 12.6 (H) 4.0 - 10.5 K/uL   RBC 4.20 (L) 4.22 - 5.81 MIL/uL   Hemoglobin 13.3 13.0 - 17.0 g/dL   HCT 53.6 14.4 - 31.5 %   MCV 100.2 (H) 80.0 - 100.0 fL   MCH 31.7 26.0 - 34.0 pg   MCHC 31.6 30.0 - 36.0 g/dL   RDW 40.0 (H) 86.7 - 61.9 %   Platelets 177 150 - 400 K/uL   nRBC 0.0 0.0 - 0.2 %    Comment: Performed at [facility], [address] [place], [place] [address]  Basic  metabolic panel     Status: Abnormal   Collection Time: 12/13/20  2:58 AM  Result Value Ref Range   Sodium 132 (L) 135 - 145 mmol/L   Potassium 5.1 3.5 - 5.1 mmol/L   Chloride 99 98 - 111 mmol/L   CO2 19 (L) 22 - 32 mmol/L   Glucose, Bld 298 (H) 70 - 99 mg/dL    Comment: Glucose reference range applies only to samples taken after fasting for at least 8 hours.   BUN 30 (H) 8 - 23 mg/dL   Creatinine, Ser 6.71 (H) 0.61 - 1.24 mg/dL   Calcium 8.8 (L) 8.9 - 10.3 mg/dL   GFR, Estimated 39 (L) >60 mL/min    Comment: (NOTE) Calculated using the CKD-EPI Creatinine Equation (2021)    Anion gap 14 5 - 15    Comment: Performed at [facility], [address] [place], [place] [address]  Hepatic function panel     Status: Abnormal   Collection Time: 12/13/20  2:58 AM  Result Value Ref Range   Total Protein 5.9 (L) 6.5 - 8.1 g/dL   Albumin 2.7 (L) 3.5 - 5.0 g/dL   AST 998 (H) 15 - 41 U/L   ALT 163 (H) 0 - 44 U/L   Alkaline Phosphatase 76 38 - 126 U/L   Total Bilirubin 5.7 (H) 0.3 - 1.2 mg/dL   Bilirubin, Direct 2.4 (H) 0.0 - 0.2 mg/dL   Indirect Bilirubin 3.3 (H) 0.3 - 0.9 mg/dL    Comment: Performed at [facility], [address] [place], [place] [address]  Lactic acid, plasma     Status: Abnormal   Collection Time: 12/13/20  2:58 AM  Result Value Ref Range   Lactic Acid, Venous 2.9 (HH) 0.5 - 1.9 mmol/L    Comment: CRITICAL RESULT CALLED TO, READ BACK BY AND VERIFIED WITHKennyth Arnold WEAVER @ 0539 ON 12/13/20 Riesa Pope Performed at [facility], [address], [place], [place] [address]   Protime-INR     Status: Abnormal   Collection Time: 12/13/20  2:58 AM  Result Value Ref Range   Prothrombin Time 16.6 (H) 11.4 - 15.2 seconds   INR 1.4 (H) 0.8 - 1.2    Comment: (NOTE) INR goal varies based on device and disease states. Performed at [facility], [address] [place], [place] [address]   APTT     Status:  None   Collection Time: 12/13/20  2:58 AM  Result Value Ref Range   aPTT 30 24 - 36 seconds    Comment: Performed at [facility], [address] [place], [place] [address]  Glucose, capillary     Status: Abnormal   Collection Time: 12/13/20  4:19 AM  Result Value Ref Range   Glucose-Capillary 301 (H) 70 - 99 mg/dL    Comment: Glucose reference range applies only to samples taken after fasting for at least 8 hours.  Lactic acid, plasma     Status: Abnormal   Collection Time: 12/13/20  5:41 AM  Result Value Ref Range   Lactic Acid, Venous 3.0 (HH) 0.5 - 1.9 mmol/L    Comment: CRITICAL VALUE NOTED.  VALUE IS CONSISTENT WITH PREVIOUSLY REPORTED AND CALLED VALUE. Performed at [facility], [address], [place], [place] [address]   D-dimer, quantitative     Status: Abnormal   Collection Time: 12/13/20  5:41 AM  Result Value Ref Range   D-Dimer, Quant 1.74 (H) 0.00 - 0.50 ug/mL-FEU    Comment: (NOTE) At the manufacturer cut-off value of 0.5 g/mL FEU, this assay has a negative predictive value of 95-100%.This assay is intended for use in conjunction with a clinical pretest probability (PTP) assessment model to exclude pulmonary embolism (PE) and deep venous thrombosis (DVT) in outpatients suspected of PE or DVT. Results should be correlated with clinical presentation. Performed at [facility], [address] [place], [place] [address]   Glucose, capillary     Status: Abnormal   Collection Time: 12/13/20  7:31 AM  Result Value Ref Range   Glucose-Capillary 264 (H) 70 - 99 mg/dL    Comment: Glucose reference range applies only to samples taken after fasting for at least 8 hours.  Hepatitis panel, acute     Status: None   Collection Time: 12/13/20 10:31 AM  Result Value Ref Range   Hepatitis B Surface Ag NON REACTIVE NON REACTIVE   HCV Ab NON REACTIVE NON REACTIVE    Comment: (NOTE) Nonreactive HCV antibody screen is  consistent with no HCV infections,  unless recent infection is suspected or other evidence exists to indicate HCV infection.     Hep A IgM NON REACTIVE NON REACTIVE   Hep B C IgM NON REACTIVE NON REACTIVE    Comment: Performed at [facility], [address], [place], [place] [address]  Alpha-1-antitrypsin     Status: Abnormal   Collection Time: 12/13/20 10:31 AM  Result Value Ref Range   A-1 Antitrypsin, Ser 216 (H) 101 - 187 mg/dL    Comment: (NOTE) Performed At: [facility] [address] [place], [place] [id number] Jolene Schimke MD [id number]   Ceruloplasmin     Status: None   Collection Time: 12/13/20 10:31 AM  Result Value Ref Range   Ceruloplasmin 22.4 16.0 - 31.0 mg/dL    Comment: (NOTE) Performed At: [facility] [address] [place], [place] [id number] Jolene Schimke MD [id number]   Iron and TIBC     Status: Abnormal   Collection Time: 12/13/20 10:31 AM  Result Value Ref Range   Iron 18 (L) 45 - 182 ug/dL   TIBC 983 382 - 505 ug/dL   Saturation Ratios 7 (L) 17.9 - 39.5 %   UIBC 236 ug/dL    Comment: Performed at [facility], [address] [place], [place] [address]  Ferritin     Status: None   Collection Time: 12/13/20 10:31 AM  Result Value Ref Range   Ferritin 203 24 - 336 ng/mL    Comment: Performed at [facility], [address] [place], [place] [address]  Glucose, capillary     Status: Abnormal   Collection Time: 12/13/20 11:36 AM  Result Value Ref Range   Glucose-Capillary 225 (H) 70 - 99 mg/dL    Comment: Glucose reference range applies only to samples taken after fasting for at least 8 hours.  Glucose, capillary     Status: Abnormal   Collection Time: 12/13/20  3:49 PM  Result Value Ref Range   Glucose-Capillary 189 (H) 70 - 99 mg/dL    Comment: Glucose reference range applies only to samples taken after fasting for at least 8 hours.  Glucose, capillary     Status: Abnormal    Collection Time: 12/13/20  7:46 PM  Result Value Ref Range   Glucose-Capillary 149 (H) 70 - 99 mg/dL    Comment: Glucose reference range applies only to samples taken after fasting for at least 8 hours.  Glucose, capillary     Status: Abnormal   Collection Time: 12/13/20 11:50 PM  Result Value Ref Range   Glucose-Capillary 128 (H) 70 - 99 mg/dL    Comment: Glucose reference range applies only to samples taken after fasting for at least 8 hours.  Urinalysis, Routine w reflex microscopic     Status: Abnormal   Collection Time: 12/14/20 12:39 AM  Result Value Ref Range   Color, Urine AMBER (A) YELLOW    Comment: BIOCHEMICALS MAY BE AFFECTED BY COLOR   APPearance CLEAR CLEAR   Specific Gravity, Urine 1.041 (H) 1.005 - 1.030   pH 5.0 5.0 - 8.0   Glucose, UA >=500 (A) NEGATIVE mg/dL   Hgb urine dipstick NEGATIVE NEGATIVE   Bilirubin Urine NEGATIVE NEGATIVE   Ketones, ur 5 (A) NEGATIVE mg/dL   Protein, ur NEGATIVE NEGATIVE mg/dL   Nitrite NEGATIVE NEGATIVE   Leukocytes,Ua NEGATIVE NEGATIVE   RBC / HPF 0-5 0 - 5 RBC/hpf   WBC, UA 0-5 0 - 5 WBC/hpf   Bacteria, UA NONE SEEN NONE SEEN    Comment: Performed at [facility], [address], [place], [place] [address]  Glucose, capillary     Status: Abnormal   Collection Time: 12/14/20  4:11 AM  Result Value Ref Range   Glucose-Capillary 155 (H) 70 - 99 mg/dL    Comment: Glucose reference range applies only to samples taken after fasting for at least 8 hours.  Comprehensive metabolic panel     Status: Abnormal   Collection Time: 12/14/20  6:03 AM  Result Value Ref Range   Sodium 135 135 - 145 mmol/L   Potassium 3.9 3.5 - 5.1 mmol/L    Comment: DELTA CHECK NOTED   Chloride 103 98 - 111 mmol/L   CO2 19 (L) 22 - 32 mmol/L   Glucose, Bld 150 (H) 70 - 99 mg/dL    Comment: Glucose reference range applies only to samples taken after fasting for at least 8 hours.   BUN 27 (H) 8 - 23 mg/dL   Creatinine, Ser 8.29 (H) 0.61 -  1.24 mg/dL   Calcium 8.9 8.9 - 56.2 mg/dL   Total Protein 5.9 (L) 6.5 - 8.1 g/dL   Albumin 2.9 (L) 3.5 - 5.0 g/dL   AST 130 (H) 15 - 41 U/L   ALT 127 (H) 0 - 44 U/L   Alkaline Phosphatase 66 38 - 126 U/L   Total Bilirubin 5.0 (H) 0.3 - 1.2 mg/dL   GFR, Estimated 57 (L) >60 mL/min    Comment: (NOTE) Calculated using the CKD-EPI Creatinine  Equation (2021)    Anion gap 13 5 - 15    Comment: Performed at [facility], [address] [place], [place] [address]  Magnesium     Status: None   Collection Time: 12/14/20  6:03 AM  Result Value Ref Range   Magnesium 2.0 1.7 - 2.4 mg/dL    Comment: Performed at [facility], [address], [place], [place] [address]  Phosphorus     Status: Abnormal   Collection Time: 12/14/20  6:03 AM  Result Value Ref Range   Phosphorus 2.1 (L) 2.5 - 4.6 mg/dL    Comment: Performed at [facility], [address], [place], [place] [address]  CBC with Differential/Platelet     Status: Abnormal   Collection Time: 12/14/20  6:03 AM  Result Value Ref Range   WBC 6.1 4.0 - 10.5 K/uL   RBC 3.90 (L) 4.22 - 5.81 MIL/uL   Hemoglobin 12.4 (L) 13.0 - 17.0 g/dL   HCT 91.4 (L) 78.2 - 95.6 %   MCV 99.5 80.0 - 100.0 fL   MCH 31.8 26.0 - 34.0 pg   MCHC 32.0 30.0 - 36.0 g/dL   RDW 21.3 (H) 08.6 - 57.8 %   Platelets 138 (L) 150 - 400 K/uL    Comment: CONSISTENT WITH PREVIOUS RESULT REPEATED TO VERIFY    nRBC 0.0 0.0 - 0.2 %   Neutrophils Relative % 77 %   Neutro Abs 4.6 1.7 - 7.7 K/uL   Lymphocytes Relative 11 %   Lymphs Abs 0.7 0.7 - 4.0 K/uL   Monocytes Relative 9 %   Monocytes Absolute 0.6 0.1 - 1.0 K/uL   Eosinophils Relative 2 %   Eosinophils Absolute 0.1 0.0 - 0.5 K/uL   Basophils Relative 0 %   Basophils Absolute 0.0 0.0 - 0.1 K/uL   Immature Granulocytes 1 %   Abs Immature Granulocytes 0.08 (H) 0.00 - 0.07 K/uL    Comment: Performed at [facility], [address]  , [place] [address]  Glucose, capillary     Status: Abnormal   Collection Time: 12/14/20  9:58 AM  Result Value Ref Range   Glucose-Capillary 165 (H) 70 - 99 mg/dL    Comment: Glucose reference range applies only to samples taken after fasting for at least 8 hours.   Comment 1 Document in Chart   Glucose, capillary     Status: Abnormal   Collection Time: 12/14/20 10:45 AM  Result Value Ref Range   Glucose-Capillary 165 (H) 70 - 99 mg/dL    Comment: Glucose reference range applies only to samples taken after fasting for at least 8 hours.    DG Chest 2 View  Result Date: 12/12/2020 CLINICAL DATA:  Abdominal pain for several days EXAM: CHEST - 2 VIEW COMPARISON:  02/29/2020 FINDINGS: Cardiac shadow is enlarged but stable. Aortic calcifications are noted. 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 Lungs are clear. No bony abnormality is noted. 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 IMPRESSION: No acute abnormality seen. Electronically Signed   By: Alcide Clever M.D.   On: 12/12/2020 20:58   CT ABDOMEN PELVIS W CONTRAST  Result Date: 12/12/2020 CLINICAL DATA:  Generalized abdominal pain for several days EXAM: CT ABDOMEN AND PELVIS WITH CONTRAST TECHNIQUE: Multidetector CT imaging of the abdomen and pelvis was performed using the standard protocol following bolus administration of intravenous  contrast. CONTRAST:  OMNIPAQUE IOHEXOL 300 MG/ML  SOLN COMPARISON:  06/10/2019 FINDINGS: Lower chest: No acute abnormality. Hepatobiliary: Fatty infiltration of the liver is noted. The gallbladder is within normal limits. No biliary ductal dilatation is seen. Pancreas: Unremarkable. No pancreatic ductal dilatation or surrounding inflammatory changes. Spleen: Normal in size without focal abnormality. Adrenals/Urinary Tract: Adrenal glands are within normal limits. Kidneys demonstrate a normal enhancement pattern.  Normal excretion of contrast is noted bilaterally. Large exophytic cyst is noted arising from the lower pole of the left kidney measuring 8.1 cm stable in appearance from the prior exam. No renal calculi or obstructive changes are seen. Ureters are within normal limits. The bladder is well distended. Stomach/Bowel: The appendix is within normal limits. No obstructive or inflammatory changes are noted within the colon. The small bowel and stomach are within normal limits. Vascular/Lymphatic: Aortic atherosclerosis. No enlarged abdominal or pelvic lymph nodes. Reproductive: Prostate is unremarkable. Other: Small fat containing umbilical hernia is again seen. No abdominopelvic ascites. Musculoskeletal: Degenerative changes of lumbar spine are noted. IMPRESSION: Fatty liver Stable renal cysts. No acute abnormality noted. Electronically Signed   By: Alcide Clever M.D.   On: 12/12/2020 20:54   MR 3D Recon At Scanner  Result Date: 12/13/2020 CLINICAL DATA:  Elevated LFTs.  Gallbladder sludge.  Jaundice. EXAM: MRI ABDOMEN WITHOUT AND WITH CONTRAST (INCLUDING MRCP) TECHNIQUE: Multiplanar multisequence MR imaging of the abdomen was performed both before and after the administration of intravenous contrast. Heavily T2-weighted images of the biliary and pancreatic ducts were obtained, and three-dimensional MRCP images were rendered by post processing. CONTRAST:  10mL GADAVIST GADOBUTROL 1 MMOL/ML IV SOLN COMPARISON:  Prior CT from December 12, 2020 and ultrasound of December 13, 2020. FINDINGS: Lower chest: Incidental imaging of the lung bases, limited on MRI without effusion or consolidation. Hepatobiliary: Marked hepatic steatosis. No focal, suspicious hepatic lesion. No pericholecystic fluid. Sludge in the dependent gallbladder. No sign of biliary duct dilation. Mildly heterogeneous parenchymal enhancement pattern without focal, suspicious hepatic lesion. Most striking on the arterial phase. The portal vein is patent. Hepatic  veins are patent. Common bile duct with intermediate signal seen in the lumen of the distal common bile duct. Mild dilation, between 6 and 7 mm of the distal common bile duct but without dilation of the proximal common bile duct or intrahepatic biliary tree. Mildly limited assessment on MRCP due to a relative lack of signal and some motion related artifact. Mildly increased T1 signal in the dependent duct as well. Pancreas: Pancreatic atrophy without ductal dilation or sign of inflammation. 12 x 12 mm lesion in the head of the atrophic pancreas with cystic features. About the body of the pancreas a 7 mm lesion also with cystic features. Spleen:  Normal Adrenals/Urinary Tract: Normal adrenal glands. The large cyst from the lower pole the LEFT kidney extending laterally. Small cyst on the RIGHT. No hydronephrosis. Stomach/Bowel: No sign of acute gastrointestinal process to the extent evaluated on abdominal MRI not performed for bowel evaluation. Vascular/Lymphatic: Abdominal vasculature is patent without signs of dilation. Mild celiac nodal prominence/top-normal size 11 mm node behind the head of the pancreas on image 66 of series 32 and another small celiac node along the course of the common hepatic artery on image 60 of series 32. Other:  No ascites Musculoskeletal: No suspicious bone lesions identified. IMPRESSION: 1. Sludge in the dependent gallbladder and sludge in the distal common bile duct. No intrahepatic biliary ductal dilation and mild dilation of the distal common  bile duct as described, significance uncertain given lack of intrahepatic biliary duct distension and signs of profound hepatic steatosis. Heterogeneous enhancement pattern in the liver without focal lesion raising the question of acute hepatitis. Correlate with laboratory values to determine significance of above findings. 2. Two cystic lesions in the pancreas, largest of which is in the head of the pancreas measuring up to 12 mm. These could  represent small side branch IPMN's. Suggest follow-up MRI/MRCP in 2 years to assess for stability. 3. Mild celiac nodal prominence/to-normal size node along the course of the common hepatic artery. This could be seen in the setting of hepatic dysfunction/liver disease. Electronically Signed   By: Donzetta Kohut M.D.   On: 12/13/2020 15:24   DG ERCP BILIARY & PANCREATIC DUCTS  Result Date: 12/14/2020 CLINICAL DATA:  ERCP with sphincterotomy. EXAM: ERCP TECHNIQUE: Multiple spot images obtained with the fluoroscopic device and submitted for interpretation post-procedure. FLUOROSCOPY TIME:  2 minutes, 44 seconds (86.3 mGy) COMPARISON:  MRCP-12/13/2020 FINDINGS: Eight spot intraoperative fluoroscopic images the right upper abdominal quadrant during ERCP are provided for review Initial image demonstrates an ERCP probe overlying the right upper abdominal quadrant Subsequent images demonstrate selective cannulation and opacification of the common bile duct which appears nondilated. Subsequent images demonstrate insufflation of a balloon within the central aspect of the CBD with subsequent sweeping and presumed sphincterotomy. There is opacification of the cystic duct with passage of contrast to the level of the gallbladder lumen. There is minimal opacification intrahepatic biliary tree which appears nondilated. There is no definitive opacification of the pancreatic duct. IMPRESSION: ERCP with biliary sweeping and presumed sphincterotomy as above. These images were submitted for radiologic interpretation only. Please see the procedural report for the amount of contrast and the fluoroscopy time utilized. Electronically Signed   By: Simonne Come M.D.   On: 12/14/2020 10:18   MR ABDOMEN MRCP W WO CONTAST  Result Date: 12/13/2020 CLINICAL DATA:  Elevated LFTs.  Gallbladder sludge.  Jaundice. EXAM: MRI ABDOMEN WITHOUT AND WITH CONTRAST (INCLUDING MRCP) TECHNIQUE: Multiplanar multisequence MR imaging of the abdomen was  performed both before and after the administration of intravenous contrast. Heavily T2-weighted images of the biliary and pancreatic ducts were obtained, and three-dimensional MRCP images were rendered by post processing. CONTRAST:  10mL GADAVIST GADOBUTROL 1 MMOL/ML IV SOLN COMPARISON:  Prior CT from December 12, 2020 and ultrasound of December 13, 2020. FINDINGS: Lower chest: Incidental imaging of the lung bases, limited on MRI without effusion or consolidation. Hepatobiliary: Marked hepatic steatosis. No focal, suspicious hepatic lesion. No pericholecystic fluid. Sludge in the dependent gallbladder. No sign of biliary duct dilation. Mildly heterogeneous parenchymal enhancement pattern without focal, suspicious hepatic lesion. Most striking on the arterial phase. The portal vein is patent. Hepatic veins are patent. Common bile duct with intermediate signal seen in the lumen of the distal common bile duct. Mild dilation, between 6 and 7 mm of the distal common bile duct but without dilation of the proximal common bile duct or intrahepatic biliary tree. Mildly limited assessment on MRCP due to a relative lack of signal and some motion related artifact. Mildly increased T1 signal in the dependent duct as well. Pancreas: Pancreatic atrophy without ductal dilation or sign of inflammation. 12 x 12 mm lesion in the head of the atrophic pancreas with cystic features. About the body of the pancreas a 7 mm lesion also with cystic features. Spleen:  Normal Adrenals/Urinary Tract: Normal adrenal glands. The large cyst from the lower pole the  LEFT kidney extending laterally. Small cyst on the RIGHT. No hydronephrosis. Stomach/Bowel: No sign of acute gastrointestinal process to the extent evaluated on abdominal MRI not performed for bowel evaluation. Vascular/Lymphatic: Abdominal vasculature is patent without signs of dilation. Mild celiac nodal prominence/top-normal size 11 mm node behind the head of the pancreas on image 66 of  series 32 and another small celiac node along the course of the common hepatic artery on image 60 of series 32. Other:  No ascites Musculoskeletal: No suspicious bone lesions identified. IMPRESSION: 1. Sludge in the dependent gallbladder and sludge in the distal common bile duct. No intrahepatic biliary ductal dilation and mild dilation of the distal common bile duct as described, significance uncertain given lack of intrahepatic biliary duct distension and signs of profound hepatic steatosis. Heterogeneous enhancement pattern in the liver without focal lesion raising the question of acute hepatitis. Correlate with laboratory values to determine significance of above findings. 2. Two cystic lesions in the pancreas, largest of which is in the head of the pancreas measuring up to 12 mm. These could represent small side branch IPMN's. Suggest follow-up MRI/MRCP in 2 years to assess for stability. 3. Mild celiac nodal prominence/to-normal size node along the course of the common hepatic artery. This could be seen in the setting of hepatic dysfunction/liver disease. Electronically Signed   By: Donzetta Kohut M.D.   On: 12/13/2020 15:24   ECHOCARDIOGRAM COMPLETE  Result Date: 12/13/2020    ECHOCARDIOGRAM REPORT   Patient Name:   Randy Liu Date of Exam: 12/13/2020 Medical Rec #:  [id number]          Height:       73.0 in Accession #:    [id number]         Weight:       277.3 lb Date of Birth:  23-Oct-1950          BSA:          2.472 m Patient Age:    69 years           BP:           99/48 mmHg Patient Gender: M                  HR:           68 bpm. Exam Location:  Inpatient Procedure: 2D Echo, Cardiac Doppler, Color Doppler and Intracardiac            Opacification Agent Indications:    CHF-Acute Systolic I50.21  History:        Patient has no prior history of Echocardiogram examinations.                 Risk Factors:Hypertension and Diabetes.  Sonographer:    Eulah Pont RDCS Referring Phys: [id number] CURTIS J  WOODS IMPRESSIONS  1. Left ventricular ejection fraction, by estimation, is 60 to 65%. The left ventricle has normal function. The left ventricle has no regional wall motion abnormalities. There is mild left ventricular hypertrophy. Left ventricular diastolic parameters are consistent with Grade II diastolic dysfunction (pseudonormalization).  2. Right ventricular systolic function is normal. The right ventricular size is normal. There is normal pulmonary artery systolic pressure.  3. The mitral valve is normal in structure. Trivial mitral valve regurgitation. No evidence of mitral stenosis.  4. The aortic valve is normal in structure. Aortic valve regurgitation is not visualized. No aortic stenosis is present.  5. The inferior vena cava is normal in size with  greater than 50% respiratory variability, suggesting right atrial pressure of 3 mmHg. FINDINGS  Left Ventricle: Left ventricular ejection fraction, by estimation, is 60 to 65%. The left ventricle has normal function. The left ventricle has no regional wall motion abnormalities. Definity contrast agent was given IV to delineate the left ventricular  endocardial borders. The left ventricular internal cavity size was normal in size. There is mild left ventricular hypertrophy. Left ventricular diastolic parameters are consistent with Grade II diastolic dysfunction (pseudonormalization). Right Ventricle: The right ventricular size is normal. No increase in right ventricular wall thickness. Right ventricular systolic function is normal. There is normal pulmonary artery systolic pressure. The tricuspid regurgitant velocity is 2.02 m/s, and  with an assumed right atrial pressure of 3 mmHg, the estimated right ventricular systolic pressure is 19.3 mmHg. Left Atrium: Left atrial size was normal in size. Right Atrium: Right atrial size was normal in size. Pericardium: There is no evidence of pericardial effusion. Mitral Valve: The mitral valve is normal in structure.  Trivial mitral valve regurgitation. No evidence of mitral valve stenosis. Tricuspid Valve: The tricuspid valve is normal in structure. Tricuspid valve regurgitation is trivial. No evidence of tricuspid stenosis. Aortic Valve: The aortic valve is normal in structure. Aortic valve regurgitation is not visualized. No aortic stenosis is present. Pulmonic Valve: The pulmonic valve was normal in structure. Pulmonic valve regurgitation is not visualized. No evidence of pulmonic stenosis. Aorta: The aortic root is normal in size and structure. Venous: The inferior vena cava is normal in size with greater than 50% respiratory variability, suggesting right atrial pressure of 3 mmHg. IAS/Shunts: No atrial level shunt detected by color flow Doppler.  LEFT VENTRICLE PLAX 2D LVIDd:         4.10 cm  Diastology LVIDs:         2.80 cm  LV e' medial:    3.89 cm/s LV PW:         1.20 cm  LV E/e' medial:  30.6 LV IVS:        1.10 cm  LV e' lateral:   4.78 cm/s LVOT diam:     1.90 cm  LV E/e' lateral: 24.9 LV SV:         62 LV SV Index:   25 LVOT Area:     2.84 cm  RIGHT VENTRICLE RV S prime:     10.60 cm/s TAPSE (M-mode): 1.5 cm LEFT ATRIUM             Index       RIGHT ATRIUM           Index LA diam:        3.10 cm 1.25 cm/m  RA Area:     11.00 cm LA Vol (A2C):   35.1 ml 14.20 ml/m RA Volume:   23.40 ml  9.46 ml/m LA Vol (A4C):   35.5 ml 14.36 ml/m LA Biplane Vol: 35.1 ml 14.20 ml/m  AORTIC VALVE LVOT Vmax:   91.60 cm/s LVOT Vmean:  70.200 cm/s LVOT VTI:    0.219 m  AORTA Ao Root diam: 3.50 cm Ao Asc diam:  3.30 cm MITRAL VALVE                TRICUSPID VALVE MV Area (PHT): 3.91 cm     TR Peak grad:   16.3 mmHg MV Decel Time: 194 msec     TR Vmax:        202.00 cm/s MV E velocity: 119.00 cm/s MV A  velocity: 91.70 cm/s   SHUNTS MV E/A ratio:  1.30         Systemic VTI:  0.22 m                             Systemic Diam: 1.90 cm Donato Schultz MD Electronically signed by Donato Schultz MD Signature Date/Time: 12/13/2020/1:21:12 PM     Final    VAS [place] LOWER EXTREMITY VENOUS (DVT)  Result Date: 12/13/2020  Lower Venous DVT Study Indications: History of PE, new SOB.  Anticoagulation: Long-term eliquis, being held for testing. Limitations: Body habitus and poor ultrasound/tissue interface. Comparison Study: No prior studies. Performing Technologist: Jean Rosenthal RDMS,RVT  Examination Guidelines: A complete evaluation includes B-mode imaging, spectral Doppler, color Doppler, and power Doppler as needed of all accessible portions of each vessel. Bilateral testing is considered an integral part of a complete examination. Limited examinations for reoccurring indications may be performed as noted. The reflux portion of the exam is performed with the patient in reverse Trendelenburg.  +---------+---------------+---------+-----------+----------+-------------------+ RIGHT    CompressibilityPhasicitySpontaneityPropertiesThrombus Aging      +---------+---------------+---------+-----------+----------+-------------------+ CFV      Full           Yes      Yes                                      +---------+---------------+---------+-----------+----------+-------------------+ SFJ      Full                                                             +---------+---------------+---------+-----------+----------+-------------------+ FV Prox  Full                                                             +---------+---------------+---------+-----------+----------+-------------------+ FV Mid   Full                                                             +---------+---------------+---------+-----------+----------+-------------------+ FV DistalFull                                                             +---------+---------------+---------+-----------+----------+-------------------+ PFV      Full                                                              +---------+---------------+---------+-----------+----------+-------------------+ POP      Full  Yes      Yes                                      +---------+---------------+---------+-----------+----------+-------------------+ PTV                     Yes      Yes                                      +---------+---------------+---------+-----------+----------+-------------------+ PERO                                                  Not well visualized +---------+---------------+---------+-----------+----------+-------------------+   +---------+---------------+---------+-----------+----------+-------------------+ LEFT     CompressibilityPhasicitySpontaneityPropertiesThrombus Aging      +---------+---------------+---------+-----------+----------+-------------------+ CFV      Full           Yes      Yes                                      +---------+---------------+---------+-----------+----------+-------------------+ SFJ      Full                                                             +---------+---------------+---------+-----------+----------+-------------------+ FV Prox  Full                                                             +---------+---------------+---------+-----------+----------+-------------------+ FV Mid   Full                                                             +---------+---------------+---------+-----------+----------+-------------------+ FV DistalFull                                                             +---------+---------------+---------+-----------+----------+-------------------+ PFV      Full                                                             +---------+---------------+---------+-----------+----------+-------------------+ POP      Full           Yes      Yes                                      +---------+---------------+---------+-----------+----------+-------------------+  PTV      Full                                         Some segments not                                                         well visualized     +---------+---------------+---------+-----------+----------+-------------------+ PERO     Full                                                             +---------+---------------+---------+-----------+----------+-------------------+     Summary: RIGHT: - There is no evidence of deep vein thrombosis in the lower extremity. However, portions of this examination were limited- see technologist comments above.  - No cystic structure found in the popliteal fossa.  LEFT: - There is no evidence of deep vein thrombosis in the lower extremity. However, portions of this examination were limited- see technologist comments above.  - No cystic structure found in the popliteal fossa.  *See table(s) above for measurements and observations. Electronically signed by Sherald Hess MD on 12/13/2020 at 4:55:00 PM.    Final    US Abdomen Limited RUQ (LIVER/GB)  Result Date: 12/13/2020 CLINICAL DATA:  Elevated LFTs EXAM: ULTRASOUND ABDOMEN LIMITED RIGHT UPPER QUADRANT COMPARISON:  CT from the previous day. FINDINGS: Gallbladder: Gallbladder is well distended with sludge within. No cholelithiasis is noted. No wall thickening or pericholecystic fluid is noted. Negative sonographic Murphy's sign is elicited. Common bile duct: Diameter: 6.5 mm. This is within normal limits for the patient's age. Liver: Mild increased echogenicity is noted consistent with fatty infiltration. No focal mass is noted. Portal vein is patent on color Doppler imaging with normal direction of blood flow towards the liver. Other: None. IMPRESSION: Gallbladder sludge without complicating factors. Fatty infiltration of the liver. Electronically Signed   By: Alcide Clever M.D.   On: 12/13/2020 03:32    Review of Systems  Constitutional: Negative.   HENT: Negative.   Eyes: Negative.    Respiratory: Negative.   Cardiovascular: Negative.   Gastrointestinal: Positive for abdominal pain, nausea and vomiting.  Endocrine: Negative.   Genitourinary: Negative.   Musculoskeletal: Negative.   Skin: Negative.   Neurological: Positive for weakness.  Hematological: Negative.   Psychiatric/Behavioral: Negative.    Blood pressure (!) 112/58, pulse 65, temperature 98.8 F (37.1 C), resp. rate 17, height 6\' 1"  (1.854 m), weight 126.2 kg, SpO2 93 %. Physical Exam Constitutional:      General: He is not in acute distress.    Appearance: Normal appearance. He is obese.  HENT:     Head: Normocephalic and atraumatic.     Right Ear: External ear normal.     Left Ear: External ear normal.     Nose: Nose normal.     Mouth/Throat:     Mouth: Mucous membranes are dry.     Pharynx: Oropharynx is clear.  Eyes:     General: No scleral icterus.    Extraocular Movements: Extraocular  movements intact.     Conjunctiva/sclera: Conjunctivae normal.     Pupils: Pupils are equal, round, and reactive to light.  Cardiovascular:     Rate and Rhythm: Normal rate and regular rhythm.     Pulses: Normal pulses.     Heart sounds: Normal heart sounds.  Pulmonary:     Effort: Pulmonary effort is normal. No respiratory distress.     Breath sounds: Normal breath sounds.  Abdominal:     Palpations: Abdomen is soft.     Comments: There is mild diffuse tenderness  Musculoskeletal:        General: No swelling or deformity. Normal range of motion.     Cervical back: Normal range of motion and neck supple. No tenderness.  Skin:    General: Skin is warm and dry.     Coloration: Skin is not jaundiced.  Neurological:     General: No focal deficit present.     Mental Status: He is alert and oriented to person, place, and time.  Psychiatric:        Mood and Affect: Mood normal.        Behavior: Behavior normal.     Assessment/Plan: The patient appears to have cholangitis that has been treated with  antibiotics and ERCP drainage.  He seems to be slowly improving although he does have some significant underlying medical conditions.  I do believe that at some point he may benefit from having his gallbladder removed.  The big question will be the timing of it given his current condition and his underlying conditions.  I do think he would do best with surgery if he were maximally tuned up from a medical standpoint prior to having the surgery.  I have discussed this with his family and they are in agreement.  We will see how the medical team feels about his overall condition and timing of surgery and we will continue to follow him closely with you.  Chevis Pretty III 12/14/2020, 12:04 PM

## 2020-12-14 NOTE — Anesthesia Procedure Notes (Signed)
Procedure Name: Intubation Date/Time: 12/14/2020 8:23 AM Performed by: Raenette Rover, CRNA Pre-anesthesia Checklist: Patient identified, Emergency Drugs available, Suction available and Patient being monitored Patient Re-evaluated:Patient Re-evaluated prior to induction Oxygen Delivery Method: Circle system utilized Preoxygenation: Pre-oxygenation with 100% oxygen Induction Type: IV induction Ventilation: Mask ventilation without difficulty Laryngoscope Size: Mac and 4 Grade View: Grade I Tube type: Oral Tube size: 7.5 mm Number of attempts: 1 Airway Equipment and Method: Stylet Placement Confirmation: ETT inserted through vocal cords under direct vision,  breath sounds checked- equal and bilateral and positive ETCO2 Secured at: 22 cm Tube secured with: Tape Dental Injury: Teeth and Oropharynx as per pre-operative assessment

## 2020-12-14 NOTE — Progress Notes (Signed)
PROGRESS NOTE    Randy Liu  HQP:[id number] DOB: 02/25/1951 DOA: 12/12/2020 PCP: Burman Freestone, MD     Brief Narrative:  Randy Liu is a 70 y.o. WM PMHx Asthma, insulin-dependent type 2 diabetes, DM neuropathy, HTN, HLD,CKD stage III followed by nephrology, OSA on CPAP, history of PE on Eliquis, GERD, IBS  Presented to the ED via EMS with abdominal pain, nausea, and vomiting x3 days.  He was hypoxic to the 80s and placed on 2 L nasal cannula.  Rectal temperature 102.3 F.  Slightly tachycardic and tachypneic.  Not hypotensive.  Labs showing WBC 6.5, hemoglobin 14.9, platelet count 198K.  Sodium 131, potassium 4.6, chloride 97, bicarb 22, BUN 27, creatinine 1.5 (at baseline), glucose 278.  AST 188, ALT 199, alk phos 74, T bili 4.1.  Lipase normal.  Lactic acid 2.3 >2.2.  INR 1.2.  Blood culture x2 pending.  UA and urine culture pending.  SARS-CoV-2 PCR test negative.  Influenza panel negative.  MRSA PCR screen pending.  Chest x-ray showing no acute abnormality.  CT abdomen pelvis showing fatty liver and no gallbladder abnormality or biliary ductal dilatation; stable renal cysts; no acute finding. Patient was given Tylenol, cefepime, metronidazole, vancomycin, and 1 L normal saline bolus.  Patient states he has been vomiting for the past 3 days and has not been able to keep any food down.  Reports epigastric/right-sided abdominal pain only when vomiting but not otherwise.  Denies fevers.  Denies history of gallbladder or liver problems.  He is fully vaccinated against COVID including booster shot.  States he had cough previously due to bronchitis which is now significantly improved.  Denies chest pain or shortness of breath.  States he has been wheelchair-bound since his Covid infection last year.  Also reports history of PE over a year ago for which he takes Eliquis and reports compliance.    Subjective: 4/2 afebrile overnight patient sleepy but arousable.  Currently on  CPAP.   Assessment & Plan: Covid vaccination; vaccinated 3/3   Principal Problem:   Acute cholangitis Active Problems:   Acute hypoxemic respiratory failure (HCC)   Hyponatremia   Asthma   Diabetes (HCC)  Severe sepsis/Acute cholangitis? -On admission patient meets criteria for severe sepsis lactic acid> 2, temp> 38 C, HR> 90, RR> 20.  Suspect set of infection acute cholangitis -Present with complaints of abdominal pain, nausea, vomiting. -Transaminases and T bili elevated (AST 188, ALT 199, T bili 4.1) with normal alk phos.  Lipase normal.   -CT abdomen; nondiagnostic see results below -Started on broad-spectrum antibiotics. -Fluid resuscitation see hypertension -Acute hepatitis panel negative -Monitor LFTs. -[facility] consulted;  -4/2 ERCP; choledocholithiasis see results below -4/2 per GI continue antibiotics x7 days, may resume Eliquis in 2 days -4/2 per GI recommendation Dr. Autumn Messing surgery consulted for cholecystectomy.  Per surgery would like to have patient maximally tuned up from a medical standpoint prior to surgery.  We will continue to follow patient and determine appropriate time for surgery.  Acute respiratory failure with hypoxia -Initially patient required 2 L O2 on admission to maintain adequate SPO2 (SPO2 in the 80s) -4/1 still requiring supplemental O2. -4/1 titrate O2 to maintain SPO2> 90% -Incentive spirometry -Flutter valve -DuoNeb TID  Asthma:  -Stable? -Patient with extremely poor air movement    History of PE -CTA PE protocol 02/29/2020 at [facility] was negative for PE -Hold Eliquis, pending GI evaluation for possible ERCP -4/1 elevated D-dimer with continued new O2 demand -4/1  VQ scan pending -Bilateral lower extremity Dopplers; negative for DVT  Acute diastolic CHF -4/1 echocardiogram consistent with diastolic CHF see results below -Patient with significant bilateral pedal edema 3+.  Per patient has chronic lower extremity  edema -Strict in and out +2.7 L -Daily weight Filed Weights   12/12/20 1945 12/13/20 0134 12/14/20 0500  Weight: 123.8 kg 125.8 kg 126.2 kg    Bacteremia positive Klebsiella pneumonia in aerobic and anaerobic bottles -Continue current antibiotics except for vancomycin, until GI sees patient.  Will await recommendations.  Discussed with pharmacy   Mild hyponatremia -Continue fluid resuscitation -Resolved   DM type II uncontrolled with complication -4/1 hemoglobin A1c = 7.9  -4/1 increase moderate SSI  Hypertension -4/1 patient currently hypotensive -Hold antihypertensives at this time given concern for sepsis. -4/1 albumin 50 g + normal saline113m/hr  Acute on CKD stage III: ([facility]  -Previous baseline creatinine 1.48 however review of his renal function shows he labile renal function. -[facility]  -On 3/31 patient received contrast dose with CT abdomen pelvis -Trend creatinine. -4/1 UA and culture pending -Hydrate See HTN Lab Results  Component Value Date   CREATININE 1.34 (H) 12/14/2020   CREATININE 1.81 (H) 12/13/2020   CREATININE 1.57 (H) 12/12/2020   OSA -Continue CPAP at night   QT prolongation -Cardiac monitoring.  - Monitor potassium and magnesium levels.   -Avoid QT prolonging drugs if possible.   -4/1 repeat EKG continued prolonged QT interval.   DVT prophylaxis:  Code Status: Full Family Communication: 4/2 wife at bedside for discussion of plan of care, answered all questions Status is: Inpatient    Dispo: The patient is from: Home              Anticipated d/c is to: Home              Anticipated d/c date is: 4/8              Patient currently unstable      Consultants:  GI Surgery Dr. TMarlou Starks   Procedures/Significant Events:  3/31 CT abdomen pelvis with contrast; showing fatty liver and no gallbladder abnormality or biliary ductal dilatation.  Stable renal  cysts 4/1 echocardiogram;Left Ventricle: LVEF= 60 to 65%.  -Grade II diastolic dysfunction (pseudonormalization). 4/1 UKoreaabdomen RUQ;Gallbladder sludge without complicating factors. -Fatty infiltration of the liver. 4/1 MRCP:-Sludge in the dependent gallbladder and sludge in the distal common bile duct. No intrahepatic biliary ductal dilation and mild dilation of the distal common bile duct  -Two cystic lesions in the pancreas, largest of which is in the head of the pancreas measuring up to 12 mm. These could represent small side branch IPMN's.  -Mild celiac nodal prominence/to-normal size node along the course of the common hepatic artery. This could be seen in the setting of hepatic dysfunction/liver disease. 4/1 bilateral lower extremity UKorea negative DVT 4/2 ERCP;Choledocholithiasis s/p biliary sphincterotomy                            with balloon extraction.                           - Ascending cholangitis                           - Periampullary diverticula.                           -  Cholelithiasis with patent cystic duct.                           - Biopsy was performed   I have personally reviewed and interpreted all radiology studies and my findings are as above.  VENTILATOR SETTINGS: Nasal cannula 4/2 Flow 2 L/min SPO2 99%   Cultures 3/31 blood RIGHT AC positive Klebsiella pneumonia in aerobic and anaerobic bottles 3/31 blood LEFT AC  positive Klebsiella pneumonia in aerobic and anaerobic bottles 3/31 SARS coronavirus negative 3/31 influenza A/B negative 4/1 acute hepatitis panel negative    Antimicrobials: Anti-infectives (From admission, onward)   Start     Ordered Stop   12/13/20 2200  vancomycin (VANCOREADY) IVPB 1500 mg/300 mL  Status:  Discontinued        12/12/20 2054 12/13/20 1003   12/13/20 2200  vancomycin (VANCOREADY) IVPB 1250 mg/250 mL  Status:  Discontinued        12/13/20 1003 12/13/20 1256   12/13/20 0800  metroNIDAZOLE (FLAGYL) IVPB 500 mg         12/13/20 0241     12/12/20 2245  metroNIDAZOLE (FLAGYL) IVPB 500 mg        12/12/20 2235 12/13/20 0108   12/12/20 2200  ceFEPIme (MAXIPIME) 2 g in sodium chloride 0.9 % 100 mL IVPB  Status:  Discontinued        12/12/20 2024 12/12/20 2033   12/12/20 2130  vancomycin (VANCOCIN) IVPB 1000 mg/200 mL premix       "Followed by" Linked Group Details   12/12/20 2024 12/12/20 2348   12/12/20 2045  ceFEPIme (MAXIPIME) 2 g in sodium chloride 0.9 % 100 mL IVPB        12/12/20 2033     12/12/20 2030  vancomycin (VANCOCIN) IVPB 1000 mg/200 mL premix       "Followed by" Linked Group Details   12/12/20 2024 12/12/20 2225       Devices    LINES / TUBES:      Continuous Infusions: . sodium chloride 100 mL/hr at 12/14/20 0811  . sodium chloride    . [MAR Hold] ceFEPime (MAXIPIME) IV Stopped (12/13/20 2240)  . [MAR Hold] metronidazole 0 mg (12/14/20 0100)     Objective: Vitals:   12/14/20 0400 12/14/20 0500 12/14/20 0600 12/14/20 0733  BP:  (!) 124/51 (!) 119/53 (!) 108/48  Pulse: 72 69 69 66  Resp: 15 15 18 18   Temp:    98.1 F (36.7 C)  TempSrc:    Oral  SpO2: 95% 95% 94% 99%  Weight:  126.2 kg    Height:        Intake/Output Summary (Last 24 hours) at 12/14/2020 0941 Last data filed at 12/14/2020 0919 Gross per 24 hour  Intake 2272.18 ml  Output 775 ml  Net 1497.18 ml   Filed Weights   12/12/20 1945 12/13/20 0134 12/14/20 0500  Weight: 123.8 kg 125.8 kg 126.2 kg    Examination:  General: AOx4, positive acute respiratory distress Eyes: negative scleral hemorrhage, negative anisocoria, negative icterus ENT: Negative Runny nose, negative gingival bleeding, Neck:  Negative scars, masses, torticollis, lymphadenopathy, JVD Lungs: diffuse decreased breath sounds bilaterally without wheezes or crackles Cardiovascular: Regular rate and rhythm without murmur gallop or rub normal S1 and S2 Abdomen: OBESE, negative abdominal pain, nondistended, positive soft, bowel sounds, no  rebound, no ascites, no appreciable mass Extremities: No significant cyanosis, clubbing, or edema bilateral lower extremities Skin: Negative rashes,  lesions, ulcers Psychiatric:  Negative depression, negative anxiety, negative fatigue, negative mania  Central nervous system:  Cranial nerves II through XII intact, tongue/uvula midline, all extremities muscle strength 5/5, sensation intact throughout, negative dysarthria, negative expressive aphasia, negative receptive aphasia.  .     Data Reviewed: Care during the described time interval was provided by me .  I have reviewed this patient's available data, including medical history, events of note, physical examination, and all test results as part of my evaluation.  CBC: Recent Labs  Lab 12/12/20 1940 12/13/20 0258 12/14/20 0603  WBC 6.5 12.6* 6.1  NEUTROABS 5.7  --  4.6  HGB 14.9 13.3 12.4*  HCT 46.3 42.1 38.8*  MCV 97.5 100.2* 99.5  PLT 198 177 536*   Basic Metabolic Panel: Recent Labs  Lab 12/12/20 1940 12/13/20 0258 12/14/20 0603  NA 131* 132* 135  K 4.6 5.1 3.9  CL 97* 99 103  CO2 22 19* 19*  GLUCOSE 278* 298* 150*  BUN 27* 30* 27*  CREATININE 1.57* 1.81* 1.34*  CALCIUM 9.0 8.8* 8.9  MG  --  2.0 2.0  PHOS  --   --  2.1*   GFR: Estimated Creatinine Clearance: 70.4 mL/min (A) (by C-G formula based on SCr of 1.34 mg/dL (H)). Liver Function Tests: Recent Labs  Lab 12/12/20 1940 12/13/20 0258 12/14/20 0603  AST 188* 121* 132*  ALT 199* 163* 127*  ALKPHOS 74 76 66  BILITOT 4.1* 5.7* 5.0*  PROT 6.8 5.9* 5.9*  ALBUMIN 2.9* 2.7* 2.9*   Recent Labs  Lab 12/12/20 1940  LIPASE 25   No results for input(s): AMMONIA in the last 168 hours. Coagulation Profile: Recent Labs  Lab 12/12/20 1940 12/13/20 0258  INR 1.2 1.4*   Cardiac Enzymes: No results for input(s): CKTOTAL, CKMB, CKMBINDEX, TROPONINI in the last 168 hours. BNP (last 3 results) No results for input(s): PROBNP in the last 8760  hours. HbA1C: Recent Labs    12/13/20 0258  HGBA1C 7.9*   CBG: Recent Labs  Lab 12/13/20 1136 12/13/20 1549 12/13/20 1946 12/13/20 2350 12/14/20 0411  GLUCAP 225* 189* 149* 128* 155*   Lipid Profile: No results for input(s): CHOL, HDL, LDLCALC, TRIG, CHOLHDL, LDLDIRECT in the last 72 hours. Thyroid Function Tests: No results for input(s): TSH, T4TOTAL, FREET4, T3FREE, THYROIDAB in the last 72 hours. Anemia Panel: Recent Labs    12/13/20 1031  FERRITIN 203  TIBC 254  IRON 18*   Sepsis Labs: Recent Labs  Lab 12/12/20 1940 12/12/20 2225 12/13/20 0258 12/13/20 0541  LATICACIDVEN 2.3* 2.2* 2.9* 3.0*    Recent Results (from the past 240 hour(s))  Culture, blood (Routine x 2)     Status: Abnormal (Preliminary result)   Collection Time: 12/12/20  7:45 PM   Specimen: BLOOD  Result Value Ref Range Status   Specimen Description   Final    BLOOD RIGHT ANTECUBITAL Performed at [facility], [address] [place], [place] [address]    Special Requests   Final    BOTTLES DRAWN AEROBIC AND ANAEROBIC Blood Culture adequate volume Performed at [facility], [address] [place], [place] [address]    Culture  Setup Time   Final    GRAM NEGATIVE RODS IN BOTH AEROBIC AND ANAEROBIC BOTTLES CRITICAL RESULT CALLED TO, READ BACK BY AND VERIFIED WITH: N Whipholt @ 6761 12/13/20 EB    Culture (A)  Final    KLEBSIELLA PNEUMONIAE SUSCEPTIBILITIES TO FOLLOW Performed at  [facility], [place] [address], [place], [place] [address]    Report Status PENDING  Incomplete  Blood Culture ID Panel (Reflexed)     Status: Abnormal   Collection Time: 12/12/20  7:45 PM  Result Value Ref Range Status   Enterococcus faecalis NOT DETECTED NOT DETECTED Final   Enterococcus Faecium NOT DETECTED NOT DETECTED Final   Listeria monocytogenes NOT DETECTED NOT DETECTED Final   Staphylococcus species NOT DETECTED NOT DETECTED Final   Staphylococcus aureus (BCID) NOT  DETECTED NOT DETECTED Final   Staphylococcus epidermidis NOT DETECTED NOT DETECTED Final   Staphylococcus lugdunensis NOT DETECTED NOT DETECTED Final   Streptococcus species NOT DETECTED NOT DETECTED Final   Streptococcus agalactiae NOT DETECTED NOT DETECTED Final   Streptococcus pneumoniae NOT DETECTED NOT DETECTED Final   Streptococcus pyogenes NOT DETECTED NOT DETECTED Final   A.calcoaceticus-baumannii NOT DETECTED NOT DETECTED Final   Bacteroides fragilis NOT DETECTED NOT DETECTED Final   Enterobacterales DETECTED (A) NOT DETECTED Final    Comment: Enterobacterales represent a large order of gram negative bacteria, not a single organism. CRITICAL RESULT CALLED TO, READ BACK BY AND VERIFIED WITH: N GLOGOVAC @ 3875 12/13/20 EB    Enterobacter cloacae complex NOT DETECTED NOT DETECTED Final   Escherichia coli NOT DETECTED NOT DETECTED Final   Klebsiella aerogenes NOT DETECTED NOT DETECTED Final   Klebsiella oxytoca NOT DETECTED NOT DETECTED Final   Klebsiella pneumoniae DETECTED (A) NOT DETECTED Final    Comment: CRITICAL RESULT CALLED TO, READ BACK BY AND VERIFIED WITH: N GLOGOVAC @ 1153 12/13/20 EB    Proteus species NOT DETECTED NOT DETECTED Final   Salmonella species NOT DETECTED NOT DETECTED Final   Serratia marcescens NOT DETECTED NOT DETECTED Final   Haemophilus influenzae NOT DETECTED NOT DETECTED Final   Neisseria meningitidis NOT DETECTED NOT DETECTED Final   Pseudomonas aeruginosa NOT DETECTED NOT DETECTED Final   Stenotrophomonas maltophilia NOT DETECTED NOT DETECTED Final   Candida albicans NOT DETECTED NOT DETECTED Final   Candida auris NOT DETECTED NOT DETECTED Final   Candida glabrata NOT DETECTED NOT DETECTED Final   Candida krusei NOT DETECTED NOT DETECTED Final   Candida parapsilosis NOT DETECTED NOT DETECTED Final   Candida tropicalis NOT DETECTED NOT DETECTED Final   Cryptococcus neoformans/gattii NOT DETECTED NOT DETECTED Final   CTX-M ESBL NOT DETECTED NOT  DETECTED Final   Carbapenem resistance IMP NOT DETECTED NOT DETECTED Final   Carbapenem resistance KPC NOT DETECTED NOT DETECTED Final   Carbapenem resistance NDM NOT DETECTED NOT DETECTED Final   Carbapenem resist OXA 48 LIKE NOT DETECTED NOT DETECTED Final   Carbapenem resistance VIM NOT DETECTED NOT DETECTED Final    Comment: Performed at [facility], [address], [place], [place] [address]  Resp Panel by RT-PCR (Flu A&B, Covid) Nasopharyngeal Swab     Status: None   Collection Time: 12/12/20  8:15 PM   Specimen: Nasopharyngeal Swab; Nasopharyngeal(NP) swabs in vial transport medium  Result Value Ref Range Status   SARS Coronavirus 2 by RT PCR NEGATIVE NEGATIVE Final    Comment: (NOTE) SARS-CoV-2 target nucleic acids are NOT DETECTED.  The SARS-CoV-2 RNA is generally detectable in upper respiratory specimens during the acute phase of infection. The lowest concentration of SARS-CoV-2 viral copies this assay can detect is 138 copies/mL. A negative result does not preclude SARS-Cov-2 infection and should not be used as the sole basis for treatment or other patient management decisions. A negative result may  occur with  improper specimen collection/handling, submission of specimen other than nasopharyngeal swab, presence of viral mutation(s) within the areas targeted by this assay, and inadequate number of viral copies(<138 copies/mL). A negative result must be combined with clinical observations, patient history, and epidemiological information. The expected result is Negative.  Fact Sheet for Patients:  EntrepreneurPulse.com.au  Fact Sheet for Healthcare Providers:  IncredibleEmployment.be  This test is no t yet approved or cleared by the [place] FDA and  has been authorized for detection and/or diagnosis of SARS-CoV-2 by FDA under an Emergency Use Authorization (EUA). This EUA will remain  in effect (meaning this test can be  used) for the duration of the COVID-19 declaration under Section 564(b)(1) of the Act, 21 U.S.C.section 360bbb-3(b)(1), unless the authorization is terminated  or revoked sooner.       Influenza A by PCR NEGATIVE NEGATIVE Final   Influenza B by PCR NEGATIVE NEGATIVE Final    Comment: (NOTE) The Xpert Xpress SARS-CoV-2/FLU/RSV plus assay is intended as an aid in the diagnosis of influenza from Nasopharyngeal swab specimens and should not be used as a sole basis for treatment. Nasal washings and aspirates are unacceptable for Xpert Xpress SARS-CoV-2/FLU/RSV testing.  Fact Sheet for Patients: EntrepreneurPulse.com.au  Fact Sheet for Healthcare Providers: IncredibleEmployment.be  This test is not yet approved or cleared by the [place] FDA and has been authorized for detection and/or diagnosis of SARS-CoV-2 by FDA under an Emergency Use Authorization (EUA). This EUA will remain in effect (meaning this test can be used) for the duration of the COVID-19 declaration under Section 564(b)(1) of the Act, 21 U.S.C. section 360bbb-3(b)(1), unless the authorization is terminated or revoked.  Performed at [facility], [address] [place], [place] [address]   Culture, blood (Routine x 2)     Status: None (Preliminary result)   Collection Time: 12/12/20  9:20 PM   Specimen: BLOOD  Result Value Ref Range Status   Specimen Description   Final    BLOOD Blood Culture adequate volume Performed at [facility], [address] [place], [place] [address]    Special Requests   Final    BOTTLES DRAWN AEROBIC AND ANAEROBIC LEFT ANTECUBITAL Performed at [facility], [address] [place], [place] [address]    Culture  Setup Time   Final    GRAM NEGATIVE RODS IN BOTH AEROBIC AND ANAEROBIC BOTTLES CRITICAL VALUE NOTED.  VALUE IS CONSISTENT WITH PREVIOUSLY REPORTED AND CALLED VALUE. Performed at [facility], [place] [address], [place], [place] [address]    Culture GRAM NEGATIVE RODS  Final   Report Status PENDING  Incomplete  MRSA PCR Screening     Status: None   Collection Time: 12/13/20  1:29 AM   Specimen: Nasopharyngeal  Result Value Ref Range Status   MRSA by PCR NEGATIVE NEGATIVE Final    Comment:        The GeneXpert MRSA Assay (FDA approved for NASAL specimens only), is one component of a comprehensive MRSA colonization surveillance program. It is not intended to diagnose MRSA infection nor to guide or monitor treatment for MRSA infections. Performed at [facility], [place] [address], [place], [place] [address]          Radiology Studies: DG Chest 2 View  Result Date: 12/12/2020 CLINICAL DATA:  Abdominal pain for several days EXAM: CHEST - 2 VIEW COMPARISON:  02/29/2020 FINDINGS: Cardiac shadow is enlarged but  stable. Aortic calcifications are noted. 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 Lungs are clear. No bony abnormality is noted. 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 IMPRESSION: No acute abnormality seen. Electronically Signed   By: Inez Catalina M.D.   On: 12/12/2020 20:58   CT ABDOMEN PELVIS W CONTRAST  Result Date: 12/12/2020 CLINICAL DATA:  Generalized abdominal pain for several days EXAM: CT ABDOMEN AND PELVIS WITH CONTRAST TECHNIQUE: Multidetector CT imaging of the abdomen and pelvis was performed using the standard protocol following bolus administration of intravenous contrast. CONTRAST:  119m OMNIPAQUE IOHEXOL 300 MG/ML  SOLN COMPARISON:  06/10/2019 FINDINGS: Lower chest: No acute abnormality. Hepatobiliary: Fatty infiltration of the liver is noted. The gallbladder is within normal limits. No biliary ductal dilatation is seen. Pancreas: Unremarkable. No pancreatic ductal dilatation or surrounding inflammatory changes. Spleen: Normal in size without focal  abnormality. Adrenals/Urinary Tract: Adrenal glands are within normal limits. Kidneys demonstrate a normal enhancement pattern. Normal excretion of contrast is noted bilaterally. Large exophytic cyst is noted arising from the lower pole of the left kidney measuring 8.1 cm stable in appearance from the prior exam. No renal calculi or obstructive changes are seen. Ureters are within normal limits. The bladder is well distended. Stomach/Bowel: The appendix is within normal limits. No obstructive or inflammatory changes are noted within the colon. The small bowel and stomach are within normal limits. Vascular/Lymphatic: Aortic atherosclerosis. No enlarged abdominal or pelvic lymph nodes. Reproductive: Prostate is unremarkable. Other: Small fat containing umbilical hernia is again seen. No abdominopelvic ascites. Musculoskeletal: Degenerative changes of lumbar spine are noted. IMPRESSION: Fatty liver Stable renal cysts. No acute abnormality noted. Electronically Signed   By: MInez CatalinaM.D.   On: 12/12/2020 20:54   MR 3D Recon At Scanner  Result Date: 12/13/2020 CLINICAL DATA:  Elevated LFTs.  Gallbladder sludge.  Jaundice. EXAM: MRI ABDOMEN WITHOUT AND WITH CONTRAST (INCLUDING MRCP) TECHNIQUE: Multiplanar multisequence MR imaging of the abdomen was performed both before and after the administration of intravenous contrast. Heavily T2-weighted images of the biliary and pancreatic ducts were obtained, and three-dimensional MRCP images were rendered by post processing. CONTRAST:  138mGADAVIST GADOBUTROL 1 MMOL/ML IV SOLN COMPARISON:  Prior CT from December 12, 2020 and ultrasound of December 13, 2020. FINDINGS: Lower chest: Incidental imaging of the lung bases, limited on MRI without effusion or consolidation. Hepatobiliary: Marked hepatic steatosis. No focal, suspicious hepatic lesion. No pericholecystic fluid. Sludge in the dependent gallbladder. No sign of biliary duct dilation. Mildly heterogeneous parenchymal  enhancement pattern without focal, suspicious hepatic lesion. Most striking on the arterial phase. The portal vein is patent. Hepatic veins are patent. Common bile duct with intermediate signal seen in the lumen of the distal common bile duct. Mild dilation, between 6 and 7 mm of the distal common bile duct but without dilation of the proximal common bile duct or intrahepatic biliary tree. Mildly limited assessment on MRCP due to a relative lack of signal and some motion related artifact. Mildly increased T1 signal in the dependent duct as well. Pancreas: Pancreatic atrophy without ductal dilation or sign of inflammation. 12 x 12 mm lesion in the head of the atrophic pancreas with cystic features. About the body of the pancreas a 7 mm lesion also with cystic features. Spleen:  Normal Adrenals/Urinary Tract: Normal adrenal glands. The large cyst from the lower pole the LEFT kidney extending laterally. Small cyst on  the RIGHT. No hydronephrosis. Stomach/Bowel: No sign of acute gastrointestinal process to the extent evaluated on abdominal MRI not performed for bowel evaluation. Vascular/Lymphatic: Abdominal vasculature is patent without signs of dilation. Mild celiac nodal prominence/top-normal size 11 mm node behind the head of the pancreas on image 66 of series 32 and another small celiac node along the course of the common hepatic artery on image 60 of series 32. Other:  No ascites Musculoskeletal: No suspicious bone lesions identified. IMPRESSION: 1. Sludge in the dependent gallbladder and sludge in the distal common bile duct. No intrahepatic biliary ductal dilation and mild dilation of the distal common bile duct as described, significance uncertain given lack of intrahepatic biliary duct distension and signs of profound hepatic steatosis. Heterogeneous enhancement pattern in the liver without focal lesion raising the question of acute hepatitis. Correlate with laboratory values to determine significance of above  findings. 2. Two cystic lesions in the pancreas, largest of which is in the head of the pancreas measuring up to 12 mm. These could represent small side branch IPMN's. Suggest follow-up MRI/MRCP in 2 years to assess for stability. 3. Mild celiac nodal prominence/to-normal size node along the course of the common hepatic artery. This could be seen in the setting of hepatic dysfunction/liver disease. Electronically Signed   By: Zetta Bills M.D.   On: 12/13/2020 15:24   MR ABDOMEN MRCP W WO CONTAST  Result Date: 12/13/2020 CLINICAL DATA:  Elevated LFTs.  Gallbladder sludge.  Jaundice. EXAM: MRI ABDOMEN WITHOUT AND WITH CONTRAST (INCLUDING MRCP) TECHNIQUE: Multiplanar multisequence MR imaging of the abdomen was performed both before and after the administration of intravenous contrast. Heavily T2-weighted images of the biliary and pancreatic ducts were obtained, and three-dimensional MRCP images were rendered by post processing. CONTRAST:  65m GADAVIST GADOBUTROL 1 MMOL/ML IV SOLN COMPARISON:  Prior CT from December 12, 2020 and ultrasound of December 13, 2020. FINDINGS: Lower chest: Incidental imaging of the lung bases, limited on MRI without effusion or consolidation. Hepatobiliary: Marked hepatic steatosis. No focal, suspicious hepatic lesion. No pericholecystic fluid. Sludge in the dependent gallbladder. No sign of biliary duct dilation. Mildly heterogeneous parenchymal enhancement pattern without focal, suspicious hepatic lesion. Most striking on the arterial phase. The portal vein is patent. Hepatic veins are patent. Common bile duct with intermediate signal seen in the lumen of the distal common bile duct. Mild dilation, between 6 and 7 mm of the distal common bile duct but without dilation of the proximal common bile duct or intrahepatic biliary tree. Mildly limited assessment on MRCP due to a relative lack of signal and some motion related artifact. Mildly increased T1 signal in the dependent duct as well.  Pancreas: Pancreatic atrophy without ductal dilation or sign of inflammation. 12 x 12 mm lesion in the head of the atrophic pancreas with cystic features. About the body of the pancreas a 7 mm lesion also with cystic features. Spleen:  Normal Adrenals/Urinary Tract: Normal adrenal glands. The large cyst from the lower pole the LEFT kidney extending laterally. Small cyst on the RIGHT. No hydronephrosis. Stomach/Bowel: No sign of acute gastrointestinal process to the extent evaluated on abdominal MRI not performed for bowel evaluation. Vascular/Lymphatic: Abdominal vasculature is patent without signs of dilation. Mild celiac nodal prominence/top-normal size 11 mm node behind the head of the pancreas on image 66 of series 32 and another small celiac node along the course of the common hepatic artery on image 60 of series 32. Other:  No ascites Musculoskeletal: No suspicious bone  lesions identified. IMPRESSION: 1. Sludge in the dependent gallbladder and sludge in the distal common bile duct. No intrahepatic biliary ductal dilation and mild dilation of the distal common bile duct as described, significance uncertain given lack of intrahepatic biliary duct distension and signs of profound hepatic steatosis. Heterogeneous enhancement pattern in the liver without focal lesion raising the question of acute hepatitis. Correlate with laboratory values to determine significance of above findings. 2. Two cystic lesions in the pancreas, largest of which is in the head of the pancreas measuring up to 12 mm. These could represent small side branch IPMN's. Suggest follow-up MRI/MRCP in 2 years to assess for stability. 3. Mild celiac nodal prominence/to-normal size node along the course of the common hepatic artery. This could be seen in the setting of hepatic dysfunction/liver disease. Electronically Signed   By: Zetta Bills M.D.   On: 12/13/2020 15:24   ECHOCARDIOGRAM COMPLETE  Result Date: 12/13/2020    ECHOCARDIOGRAM REPORT    Patient Name:   BRANKO STEEVES Date of Exam: 12/13/2020 Medical Rec #:  [id number]          Height:       73.0 in Accession #:    [id number]         Weight:       277.3 lb Date of Birth:  1951/01/08          BSA:          2.472 m Patient Age:    73 years           BP:           99/48 mmHg Patient Gender: M                  HR:           68 bpm. Exam Location:  Inpatient Procedure: 2D Echo, Cardiac Doppler, Color Doppler and Intracardiac            Opacification Agent Indications:    CHF-Acute Systolic G62.69  History:        Patient has no prior history of Echocardiogram examinations.                 Risk Factors:Hypertension and Diabetes.  Sonographer:    Bernadene Person RDCS Referring Phys: [id number] [place]  1. Left ventricular ejection fraction, by estimation, is 60 to 65%. The left ventricle has normal function. The left ventricle has no regional wall motion abnormalities. There is mild left ventricular hypertrophy. Left ventricular diastolic parameters are consistent with Grade II diastolic dysfunction (pseudonormalization).  2. Right ventricular systolic function is normal. The right ventricular size is normal. There is normal pulmonary artery systolic pressure.  3. The mitral valve is normal in structure. Trivial mitral valve regurgitation. No evidence of mitral stenosis.  4. The aortic valve is normal in structure. Aortic valve regurgitation is not visualized. No aortic stenosis is present.  5. The inferior vena cava is normal in size with greater than 50% respiratory variability, suggesting right atrial pressure of 3 mmHg. FINDINGS  Left Ventricle: Left ventricular ejection fraction, by estimation, is 60 to 65%. The left ventricle has normal function. The left ventricle has no regional wall motion abnormalities. Definity contrast agent was given IV to delineate the left ventricular  endocardial borders. The left ventricular internal cavity size was normal in size. There is mild left  ventricular hypertrophy. Left ventricular diastolic parameters are consistent with Grade II diastolic dysfunction (pseudonormalization). Right Ventricle: The  right ventricular size is normal. No increase in right ventricular wall thickness. Right ventricular systolic function is normal. There is normal pulmonary artery systolic pressure. The tricuspid regurgitant velocity is 2.02 m/s, and  with an assumed right atrial pressure of 3 mmHg, the estimated right ventricular systolic pressure is 87.6 mmHg. Left Atrium: Left atrial size was normal in size. Right Atrium: Right atrial size was normal in size. Pericardium: There is no evidence of pericardial effusion. Mitral Valve: The mitral valve is normal in structure. Trivial mitral valve regurgitation. No evidence of mitral valve stenosis. Tricuspid Valve: The tricuspid valve is normal in structure. Tricuspid valve regurgitation is trivial. No evidence of tricuspid stenosis. Aortic Valve: The aortic valve is normal in structure. Aortic valve regurgitation is not visualized. No aortic stenosis is present. Pulmonic Valve: The pulmonic valve was normal in structure. Pulmonic valve regurgitation is not visualized. No evidence of pulmonic stenosis. Aorta: The aortic root is normal in size and structure. Venous: The inferior vena cava is normal in size with greater than 50% respiratory variability, suggesting right atrial pressure of 3 mmHg. IAS/Shunts: No atrial level shunt detected by color flow Doppler.  LEFT VENTRICLE PLAX 2D LVIDd:         4.10 cm  Diastology LVIDs:         2.80 cm  LV e' medial:    3.89 cm/s LV PW:         1.20 cm  LV E/e' medial:  30.6 LV IVS:        1.10 cm  LV e' lateral:   4.78 cm/s LVOT diam:     1.90 cm  LV E/e' lateral: 24.9 LV SV:         62 LV SV Index:   25 LVOT Area:     2.84 cm  RIGHT VENTRICLE RV S prime:     10.60 cm/s TAPSE (M-mode): 1.5 cm LEFT ATRIUM             Index       RIGHT ATRIUM           Index LA diam:        3.10 cm 1.25 cm/m   RA Area:     11.00 cm LA Vol (A2C):   35.1 ml 14.20 ml/m RA Volume:   23.40 ml  9.46 ml/m LA Vol (A4C):   35.5 ml 14.36 ml/m LA Biplane Vol: 35.1 ml 14.20 ml/m  AORTIC VALVE LVOT Vmax:   91.60 cm/s LVOT Vmean:  70.200 cm/s LVOT VTI:    0.219 m  AORTA Ao Root diam: 3.50 cm Ao Asc diam:  3.30 cm MITRAL VALVE                TRICUSPID VALVE MV Area (PHT): 3.91 cm     TR Peak grad:   16.3 mmHg MV Decel Time: 194 msec     TR Vmax:        202.00 cm/s MV E velocity: 119.00 cm/s MV A velocity: 91.70 cm/s   SHUNTS MV E/A ratio:  1.30         Systemic VTI:  0.22 m                             Systemic Diam: 1.90 cm Candee Furbish MD Electronically signed by Candee Furbish MD Signature Date/Time: 12/13/2020/1:21:12 PM    Final    VAS [place] LOWER EXTREMITY VENOUS (DVT)  Result Date: 12/13/2020  Lower Venous DVT Study Indications: History of PE, new SOB.  Anticoagulation: Long-term eliquis, being held for testing. Limitations: Body habitus and poor ultrasound/tissue interface. Comparison Study: No prior studies. Performing Technologist: Darlin Coco RDMS,RVT  Examination Guidelines: A complete evaluation includes B-mode imaging, spectral Doppler, color Doppler, and power Doppler as needed of all accessible portions of each vessel. Bilateral testing is considered an integral part of a complete examination. Limited examinations for reoccurring indications may be performed as noted. The reflux portion of the exam is performed with the patient in reverse Trendelenburg.  +---------+---------------+---------+-----------+----------+-------------------+ RIGHT    CompressibilityPhasicitySpontaneityPropertiesThrombus Aging      +---------+---------------+---------+-----------+----------+-------------------+ CFV      Full           Yes      Yes                                      +---------+---------------+---------+-----------+----------+-------------------+ SFJ      Full                                                              +---------+---------------+---------+-----------+----------+-------------------+ FV Prox  Full                                                             +---------+---------------+---------+-----------+----------+-------------------+ FV Mid   Full                                                             +---------+---------------+---------+-----------+----------+-------------------+ FV DistalFull                                                             +---------+---------------+---------+-----------+----------+-------------------+ PFV      Full                                                             +---------+---------------+---------+-----------+----------+-------------------+ POP      Full           Yes      Yes                                      +---------+---------------+---------+-----------+----------+-------------------+ PTV                     Yes      Yes                                      +---------+---------------+---------+-----------+----------+-------------------+  PERO                                                  Not well visualized +---------+---------------+---------+-----------+----------+-------------------+   +---------+---------------+---------+-----------+----------+-------------------+ LEFT     CompressibilityPhasicitySpontaneityPropertiesThrombus Aging      +---------+---------------+---------+-----------+----------+-------------------+ CFV      Full           Yes      Yes                                      +---------+---------------+---------+-----------+----------+-------------------+ SFJ      Full                                                             +---------+---------------+---------+-----------+----------+-------------------+ FV Prox  Full                                                              +---------+---------------+---------+-----------+----------+-------------------+ FV Mid   Full                                                             +---------+---------------+---------+-----------+----------+-------------------+ FV DistalFull                                                             +---------+---------------+---------+-----------+----------+-------------------+ PFV      Full                                                             +---------+---------------+---------+-----------+----------+-------------------+ POP      Full           Yes      Yes                                      +---------+---------------+---------+-----------+----------+-------------------+ PTV      Full                                         Some segments not  well visualized     +---------+---------------+---------+-----------+----------+-------------------+ PERO     Full                                                             +---------+---------------+---------+-----------+----------+-------------------+     Summary: RIGHT: - There is no evidence of deep vein thrombosis in the lower extremity. However, portions of this examination were limited- see technologist comments above.  - No cystic structure found in the popliteal fossa.  LEFT: - There is no evidence of deep vein thrombosis in the lower extremity. However, portions of this examination were limited- see technologist comments above.  - No cystic structure found in the popliteal fossa.  *See table(s) above for measurements and observations. Electronically signed by Monica Martinez MD on 12/13/2020 at 4:55:00 PM.    Final    US Abdomen Limited RUQ (LIVER/GB)  Result Date: 12/13/2020 CLINICAL DATA:  Elevated LFTs EXAM: ULTRASOUND ABDOMEN LIMITED RIGHT UPPER QUADRANT COMPARISON:  CT from the previous day. FINDINGS: Gallbladder: Gallbladder is well  distended with sludge within. No cholelithiasis is noted. No wall thickening or pericholecystic fluid is noted. Negative sonographic Murphy's sign is elicited. Common bile duct: Diameter: 6.5 mm. This is within normal limits for the patient's age. Liver: Mild increased echogenicity is noted consistent with fatty infiltration. No focal mass is noted. Portal vein is patent on color Doppler imaging with normal direction of blood flow towards the liver. Other: None. IMPRESSION: Gallbladder sludge without complicating factors. Fatty infiltration of the liver. Electronically Signed   By: Inez Catalina M.D.   On: 12/13/2020 03:32        Scheduled Meds: . [MAR Hold] chlorhexidine  15 mL Mouth Rinse BID  . [MAR Hold] Chlorhexidine Gluconate Cloth  6 each Topical Daily  . [MAR Hold] insulin aspart  0-15 Units Subcutaneous Q4H  . [MAR Hold] ipratropium-albuterol  3 mL Nebulization BID  . [facility] Hold] mouth rinse  15 mL Mouth Rinse q12n4p   Continuous Infusions: . sodium chloride 100 mL/hr at 12/14/20 0811  . sodium chloride    . [MAR Hold] ceFEPime (MAXIPIME) IV Stopped (12/13/20 2240)  . [MAR Hold] metronidazole 0 mg (12/14/20 0100)     LOS: 1 day    Time spent:40 min    Dicy Smigel, Geraldo Docker, MD [facility]   If 7PM-7AM, please contact night-coverage 12/14/2020, 9:41 AM

## 2020-12-14 NOTE — Op Note (Addendum)
[facility] Patient Name: Randy PippinMichael Alipio Procedure Date: 12/14/2020 MRN: [id number] Attending MD: Lynann Bolognaajesh Nanako Stopher , MD Date of Birth: 1951/05/24 CSN: [id number] Age: 70 Admit Type: Inpatient Procedure:                ERCP Indications:              Ascending cholangitis Providers:                Lynann Bolognaajesh Mahaila Tischer, MD, Vicki MalletBrandy Grace, RN, Rozetta NunneryAnthony                            Gillies, Technician Referring MD:              Medicines:                General Anesthesia Complications:            No immediate complications. Estimated Blood Loss:     Estimated blood loss was minimal. Procedure:                Pre-Anesthesia Assessment:                           - Prior to the procedure, a History and Physical                            was performed, and patient medications and                            allergies were reviewed. The patient's tolerance of                            previous anesthesia was also reviewed. The risks                            and benefits of the procedure and the sedation                            options and risks were discussed with the patient.                            All questions were answered, and informed consent                            was obtained. Prior Anticoagulants: The patient has                            taken Eliquis (apixaban), last dose was 2 days                            prior to procedure. ASA Grade Assessment: III - A                            patient with severe systemic disease. After                            reviewing the  risks and benefits, the patient was                            deemed in satisfactory condition to undergo the                            procedure.                           After obtaining informed consent, the scope was                            passed under direct vision. Throughout the                            procedure, the patient's blood pressure, pulse, and                             oxygen saturations were monitored continuously. The                            TJF-Q180V ([id number]) Olympus Duodenoscope was                            introduced through the mouth, and used to inject                            contrast into and used to inject contrast into the                            bile duct. The ERCP was accomplished without                            difficulty. The patient tolerated the procedure                            well. Scope In: Scope Out: Findings:      The scout film was normal. The esophagus was successfully intubated       under direct vision. The scope was advanced to a normal major papilla in       the descending duodenum without detailed examination of the pharynx,       larynx and associated structures, and upper GI tract. The upper GI tract       was grossly normal.      Periampullary diverticula with buldging frond like major papilla was       noted. There was a small superficial erosion over the major papilla. The       bile duct was deeply cannulated with the short-nosed traction       sphincterotome using wire guided technique. Contrast was injected. I       personally interpreted the bile duct images. Ductal flow of contrast was       adequate. Image quality was adequate. Contrast extended to the entire       biliary tree. Choledocholithiasis was found in a mildly dilated CBD       measuring 8  mm. The lower third of the main bile duct and middle third       of the main bile duct contained three stones, the largest of which was 6       mm in diameter. An 8 mm biliary sphincterotomy was made with a traction       (standard) sphincterotome using ERBE electrocautery using endocut mode.       There was drainage of green bile with some pus. The biliary tree was       swept with a 10-12 mm balloon starting at the bifurcation multiple       times. All stones were removed confirmed by post-occlusion cholangigram.       Minimal oozing of  sphicterotomy site when balloon was trawled which       stopped on its own. Cystic duct and GB did fill. Multiple filling       defects in GB s/o cholelithiasis.      Major papilla were biopsied with a cold forceps for histology.      Pancreatic duct was never cannulated or injected intentionally to       decrease risks of PEP. Indocin was not given d/t "NSAIDs allergy" Impression:               - Choledocholithiasis s/p biliary sphincterotomy                            with balloon extraction.                           - Ascending cholangitis                           - Periampullary diverticula.                           - Cholelithiasis with patent cystic duct.                           - Biopsy was performed Major papilla. Moderate Sedation:      Not Applicable - Patient had care per Anesthesia. Recommendation:           - Return patient to hospital ward for ongoing care.                           - Watch for pancreatitis, bleeding, perforation,                            and cholangitis.                           - Continue A/Bs x 7 days.                           - Consider Sx consult for lap chole.                           - Resume eliquis in 2 days if no Sx. Otherwise  afterwards.                           - Await pathology results.                           - D/W Dr Marcos Eke and pt's wife.                           - The findings and recommendations were discussed                            with the patient's family. Procedure Code(s):        --- Professional ---                           [phone], Endoscopic retrograde                            cholangiopancreatography (ERCP); with removal of                            calculi/debris from biliary/pancreatic duct(s)                           43262, Endoscopic retrograde                            cholangiopancreatography (ERCP); with                            sphincterotomy/papillotomy                            43261, Endoscopic retrograde                            cholangiopancreatography (ERCP); with biopsy,                            single or multiple                           [id number], Endoscopic catheterization of the biliary                            ductal system, radiological supervision and                            interpretation Diagnosis Code(s):        --- Professional ---                           K80.50, Calculus of bile duct without cholangitis                            or cholecystitis without obstruction CPT copyright 2019 [employer]. All rights reserved. The codes documented in this report are preliminary and upon coder review may  be revised to meet current compliance requirements. Lynann Bologna, MD 12/14/2020 9:53:59 AM This report has been signed electronically. Number of Addenda: 0

## 2020-12-15 ENCOUNTER — Encounter (HOSPITAL_COMMUNITY): Payer: Self-pay | Admitting: Gastroenterology

## 2020-12-15 DIAGNOSIS — K8309 Other cholangitis: Secondary | ICD-10-CM | POA: Diagnosis not present

## 2020-12-15 DIAGNOSIS — B379 Candidiasis, unspecified: Secondary | ICD-10-CM

## 2020-12-15 DIAGNOSIS — A419 Sepsis, unspecified organism: Secondary | ICD-10-CM | POA: Diagnosis not present

## 2020-12-15 DIAGNOSIS — R7989 Other specified abnormal findings of blood chemistry: Secondary | ICD-10-CM | POA: Diagnosis present

## 2020-12-15 DIAGNOSIS — J4520 Mild intermittent asthma, uncomplicated: Secondary | ICD-10-CM | POA: Diagnosis not present

## 2020-12-15 DIAGNOSIS — J9601 Acute respiratory failure with hypoxia: Secondary | ICD-10-CM | POA: Diagnosis not present

## 2020-12-15 LAB — CULTURE, BLOOD (ROUTINE X 2)
Special Requests: ADEQUATE
Specimen Description: ADEQUATE

## 2020-12-15 LAB — GLUCOSE, CAPILLARY
Glucose-Capillary: 166 mg/dL — ABNORMAL HIGH (ref 70–99)
Glucose-Capillary: 160 mg/dL — ABNORMAL HIGH (ref 70–99)
Glucose-Capillary: 238 mg/dL — ABNORMAL HIGH (ref 70–99)
Glucose-Capillary: 185 mg/dL — ABNORMAL HIGH (ref 70–99)
Glucose-Capillary: 205 mg/dL — ABNORMAL HIGH (ref 70–99)

## 2020-12-15 LAB — CBC WITH DIFFERENTIAL/PLATELET
Abs Immature Granulocytes: 0.23 10*3/uL — ABNORMAL HIGH (ref 0.00–0.07)
Basophils Absolute: 0 10*3/uL (ref 0.0–0.1)
Basophils Relative: 0 %
Eosinophils Absolute: 0.1 10*3/uL (ref 0.0–0.5)
Eosinophils Relative: 2 %
HCT: 39.3 % (ref 39.0–52.0)
Hemoglobin: 12.2 g/dL — ABNORMAL LOW (ref 13.0–17.0)
Immature Granulocytes: 3 %
Lymphocytes Relative: 15 %
Lymphs Abs: 1.1 10*3/uL (ref 0.7–4.0)
MCH: 31.6 pg (ref 26.0–34.0)
MCHC: 31 g/dL (ref 30.0–36.0)
MCV: 101.8 fL — ABNORMAL HIGH (ref 80.0–100.0)
Monocytes Absolute: 0.8 10*3/uL (ref 0.1–1.0)
Monocytes Relative: 11 %
Neutro Abs: 5.3 10*3/uL (ref 1.7–7.7)
Neutrophils Relative %: 69 %
Platelets: 137 10*3/uL — ABNORMAL LOW (ref 150–400)
RBC: 3.86 MIL/uL — ABNORMAL LOW (ref 4.22–5.81)
RDW: 17 % — ABNORMAL HIGH (ref 11.5–15.5)
WBC: 7.7 10*3/uL (ref 4.0–10.5)
nRBC: 0 % (ref 0.0–0.2)

## 2020-12-15 LAB — COMPREHENSIVE METABOLIC PANEL
ALT: 137 U/L — ABNORMAL HIGH (ref 0–44)
AST: 182 U/L — ABNORMAL HIGH (ref 15–41)
Albumin: 2.7 g/dL — ABNORMAL LOW (ref 3.5–5.0)
Alkaline Phosphatase: 90 U/L (ref 38–126)
Anion gap: 12 (ref 5–15)
BUN: 24 mg/dL — ABNORMAL HIGH (ref 8–23)
CO2: 19 mmol/L — ABNORMAL LOW (ref 22–32)
Calcium: 8.8 mg/dL — ABNORMAL LOW (ref 8.9–10.3)
Chloride: 101 mmol/L (ref 98–111)
Creatinine, Ser: 1.08 mg/dL (ref 0.61–1.24)
GFR, Estimated: >60 mL/min (ref >60)
Glucose, Bld: 155 mg/dL — ABNORMAL HIGH (ref 70–99)
Potassium: 4.9 mmol/L (ref 3.5–5.1)
Sodium: 132 mmol/L — ABNORMAL LOW (ref 135–145)
Total Bilirubin: 6.1 mg/dL — ABNORMAL HIGH (ref 0.3–1.2)
Total Protein: 5.8 g/dL — ABNORMAL LOW (ref 6.5–8.1)

## 2020-12-15 LAB — MAGNESIUM: Magnesium: 2 mg/dL (ref 1.7–2.4)

## 2020-12-15 LAB — URINE CULTURE: Culture: 40000 — AB

## 2020-12-15 LAB — PHOSPHORUS: Phosphorus: 1.9 mg/dL — ABNORMAL LOW (ref 2.5–4.6)

## 2020-12-15 MED ORDER — SODIUM CHLORIDE 0.9 % IV SOLN
2.0000 g | INTRAVENOUS | Status: DC
  Administered 2020-12-15 – 2020-12-18 (×4): 2 g via INTRAVENOUS
  Filled 2020-12-15: qty 20
  Filled 2020-12-15: qty 2
  Filled 2020-12-15: qty 20
  Filled 2020-12-15: qty 2

## 2020-12-15 MED ORDER — LATANOPROST 0.005 % OP SOLN: 1.0000 [drp] | Freq: Every day | OPHTHALMIC | Status: DC

## 2020-12-15 MED ORDER — FLUCONAZOLE IN SODIUM CHLORIDE 200-0.9 MG/100ML-% IV SOLN
200.0000 mg | Freq: Once | INTRAVENOUS | Status: AC
  Administered 2020-12-15: 200 mg via INTRAVENOUS
  Filled 2020-12-15: qty 100

## 2020-12-15 MED ORDER — HYDRALAZINE HCL 10 MG PO TABS
10.0000 mg | ORAL_TABLET | Freq: Three times a day (TID) | ORAL | Status: DC
  Administered 2020-12-15: 10 mg via ORAL
  Filled 2020-12-15: qty 1

## 2020-12-15 MED ORDER — AMLODIPINE BESYLATE 10 MG PO TABS
10.0000 mg | ORAL_TABLET | Freq: Every day | ORAL | Status: DC
  Administered 2020-12-16 – 2020-12-23 (×8): 10 mg via ORAL
  Filled 2020-12-15 (×2): qty 1
  Filled 2020-12-15: qty 2
  Filled 2020-12-15 (×6): qty 1

## 2020-12-15 MED ORDER — HYDRALAZINE HCL 25 MG PO TABS
25.0000 mg | ORAL_TABLET | Freq: Four times a day (QID) | ORAL | Status: DC
  Administered 2020-12-15 – 2020-12-23 (×31): 25 mg via ORAL
  Filled 2020-12-15 (×29): qty 1

## 2020-12-15 MED ORDER — AMLODIPINE BESYLATE 5 MG PO TABS
5.0000 mg | ORAL_TABLET | Freq: Once | ORAL | Status: AC
  Administered 2020-12-15: 5 mg via ORAL
  Filled 2020-12-15: qty 1

## 2020-12-15 MED ORDER — LATANOPROST 0.005 % OP SOLN
1.0000 [drp] | Freq: Every day | OPHTHALMIC | Status: DC
  Administered 2020-12-15 – 2020-12-22 (×8): 1 [drp] via OPHTHALMIC
  Filled 2020-12-15: qty 2.5

## 2020-12-15 MED ORDER — AMLODIPINE BESYLATE 5 MG PO TABS
5.0000 mg | ORAL_TABLET | Freq: Every day | ORAL | Status: DC
  Administered 2020-12-15: 5 mg via ORAL
  Filled 2020-12-15: qty 1

## 2020-12-15 MED ORDER — FLUCONAZOLE 100MG IVPB
100.0000 mg | INTRAVENOUS | Status: DC
  Filled 2020-12-15 (×2): qty 50

## 2020-12-15 MED ORDER — METOPROLOL TARTRATE 5 MG/5ML IV SOLN
5.0000 mg | Freq: Four times a day (QID) | INTRAVENOUS | Status: DC | PRN
  Filled 2020-12-15: qty 5

## 2020-12-15 NOTE — Progress Notes (Addendum)
PROGRESS NOTE    Randy Liu  HWT:[id number] DOB: 1951/05/10 DOA: 12/12/2020 PCP: Burman Freestone, MD     Brief Narrative:  Randy Liu is a 70 y.o. WM PMHx Asthma, insulin-dependent type 2 diabetes, DM neuropathy, HTN, HLD,CKD stage III followed by nephrology, OSA on CPAP, history of PE on Eliquis, GERD, IBS  Presented to the ED via EMS with abdominal pain, nausea, and vomiting x3 days.  He was hypoxic to the 80s and placed on 2 L nasal cannula.  Rectal temperature 102.3 F.  Slightly tachycardic and tachypneic.  Not hypotensive.  Labs showing WBC 6.5, hemoglobin 14.9, platelet count 198K.  Sodium 131, potassium 4.6, chloride 97, bicarb 22, BUN 27, creatinine 1.5 (at baseline), glucose 278.  AST 188, ALT 199, alk phos 74, T bili 4.1.  Lipase normal.  Lactic acid 2.3 >2.2.  INR 1.2.  Blood culture x2 pending.  UA and urine culture pending.  SARS-CoV-2 PCR test negative.  Influenza panel negative.  MRSA PCR screen pending.  Chest x-ray showing no acute abnormality.  CT abdomen pelvis showing fatty liver and no gallbladder abnormality or biliary ductal dilatation; stable renal cysts; no acute finding. Patient was given Tylenol, cefepime, metronidazole, vancomycin, and 1 L normal saline bolus.  Patient states he has been vomiting for the past 3 days and has not been able to keep any food down.  Reports epigastric/right-sided abdominal pain only when vomiting but not otherwise.  Denies fevers.  Denies history of gallbladder or liver problems.  He is fully vaccinated against COVID including booster shot.  States he had cough previously due to bronchitis which is now significantly improved.  Denies chest pain or shortness of breath.  States he has been wheelchair-bound since his Covid infection last year.  Also reports history of PE over a year ago for which he takes Eliquis and reports compliance.    Subjective: 4/3 afebrile overnight, A/O x4, upset about the [facility] game.     Assessment & Plan: Covid vaccination; vaccinated 3/3   Principal Problem:   Acute cholangitis Active Problems:   Acute hypoxemic respiratory failure (HCC)   Hyponatremia   Asthma   Diabetes (HCC)   Yeast infection   Elevated LFTs  Severe sepsis/Acute cholangitis/elevated LFT -On admission patient meets criteria for severe sepsis lactic acid> 2, temp> 38 C, HR> 90, RR> 20.  Suspect set of infection acute cholangitis -Present with complaints of abdominal pain, nausea, vomiting. -Transaminases and T bili elevated (AST 188, ALT 199, T bili 4.1) with normal alk phos.  Lipase normal.   -CT abdomen; nondiagnostic see results below -Started on broad-spectrum antibiotics. -Fluid resuscitation see hypertension -Acute hepatitis panel negative -[place]  GI consulted;  -4/2 ERCP; choledocholithiasis see results below -4/2 per GI continue antibiotics x7 days, may resume Eliquis in 2 days -4/2 per GI recommendation Dr. Autumn Messing surgery consulted for cholecystectomy.  Per surgery would like to have patient maximally tuned up from a medical standpoint prior to surgery.  We will continue to follow patient and determine appropriate time for surgery. -4/3 LFTs continue to be elevated.  Most likely secondary to ERCP continue to monitor.  Acute respiratory failure with hypoxia -Initially patient required 2 L O2 on admission to maintain adequate SPO2 (SPO2 in the 80s) -4/1 still requiring supplemental O2. -4/1 titrate O2 to maintain SPO2> 90% -Incentive spirometry -Flutter valve -DuoNeb TID  Asthma:  -Stable? -Patient with extremely poor air movement  -4/3 respiratory status improving  History of PE -CTA PE protocol  02/29/2020 at [facility] was negative for PE -Hold Eliquis, pending GI evaluation for possible ERCP -4/1 elevated D-dimer with continued new O2 demand -4/1 VQ scan pending -Bilateral lower extremity Dopplers; negative for DVT  Acute diastolic CHF -4/1 echocardiogram  consistent with diastolic CHF see results below -Patient with significant bilateral pedal edema 3+.  Per patient has chronic lower extremity edema -Strict in and out +1.5 L -Daily weight Filed Weights   12/13/20 0134 12/14/20 0500 12/15/20 0427  Weight: 125.8 kg 126.2 kg 130.6 kg  -4/3 normal saline 166m/hr -4/3 Amlodipine 10 mg daily -4/3 Hydralazine 25 mg QID -4/3 Metoprolol IV PRN  Bacteremia positive Klebsiella pneumonia in aerobic and anaerobic bottles -Continue current antibiotics except for vancomycin, until GI sees patient.  Will await recommendations.  Discussed with pharmacy  Mild hyponatremia -Continue fluid resuscitation -Resolved   DM type II uncontrolled with complication -4/1 hemoglobin A1c = 7.9  -4/1 increase moderate SSI  Hypertension -4/1 patient currently hypotensive -4/1 albumin 50 g + normal saline1048mhr  -4/3 see CHF   Acute on CKD stage III: ([facility]  -Previous baseline creatinine 1.48 however review of his renal function shows he labile renal function. -[facility] -On 3/31 patient received contrast dose with CT abdomen pelvis -Trend creatinine. -4/1 UA and culture pending -Hydrate See HTN Lab Results  Component Value Date   CREATININE 1.08 12/15/2020   CREATININE 1.34 (H) 12/14/2020   CREATININE 1.81 (H) 12/13/2020   CREATININE 1.57 (H) 12/12/2020   UTI positive yeast -4/3 normally might not treat given only 40,000 colonies however given that patient may go for surgery and his severe sepsis will treat -4/3 Fluconazole IV 200 mg x 1; then 100 mg x 6 days  OSA -Continue CPAP at night  QT prolongation -Cardiac monitoring.  - Monitor potassium and magnesium levels.   -Avoid QT prolonging drugs if possible.   -4/1 repeat EKG continued prolonged QT interval.    DVT prophylaxis:  Code Status: Full Family Communication: 4/3 wife at bedside for discussion of plan  of care, answered all questions Status is: Inpatient    Dispo: The patient is from: Home              Anticipated d/c is to: Home              Anticipated d/c date is: 4/8              Patient currently unstable      Consultants:  GI Surgery Dr. ToMarlou Starks  Procedures/Significant Events:  3/31 CT abdomen pelvis with contrast; showing fatty liver and no gallbladder abnormality or biliary ductal dilatation.  Stable renal cysts 4/1 echocardiogram;Left Ventricle: LVEF= 60 to 65%.  -Grade II diastolic dysfunction (pseudonormalization). 4/1 USKoreabdomen RUQ;Gallbladder sludge without complicating factors. -Fatty infiltration of the liver. 4/1 MRCP:-Sludge in the dependent gallbladder and sludge in the distal common bile duct. No intrahepatic biliary ductal dilation and mild dilation of the distal common bile duct  -Two cystic lesions in the pancreas, largest of which is in the head of the pancreas measuring up to 12 mm. These could represent small side branch IPMN's.  -Mild celiac nodal prominence/to-normal size node along the course of the common hepatic artery. This could be seen in the setting of hepatic dysfunction/liver disease. 4/1 bilateral lower extremity USKoreanegative DVT 4/2 ERCP;Choledocholithiasis s/p biliary sphincterotomy  with balloon extraction.                           - Ascending cholangitis                           - Periampullary diverticula.                           - Cholelithiasis with patent cystic duct.                           - Biopsy was performed   I have personally reviewed and interpreted all radiology studies and my findings are as above.  VENTILATOR SETTINGS: Nasal cannula 4/3 Flow 4 L/min SPO2 98%   Cultures 3/31 blood RIGHT AC positive Klebsiella pneumonia in aerobic and anaerobic bottles 3/31 blood LEFT AC  positive Klebsiella pneumonia in aerobic and anaerobic bottles 3/31 SARS coronavirus negative 3/31  influenza A/B negative 4/1 acute hepatitis panel negative 4/2 urine positive yeast    Antimicrobials: Anti-infectives (From admission, onward)   Start     Ordered Stop   12/13/20 2200  vancomycin (VANCOREADY) IVPB 1500 mg/300 mL  Status:  Discontinued        12/12/20 2054 12/13/20 1003   12/13/20 2200  vancomycin (VANCOREADY) IVPB 1250 mg/250 mL  Status:  Discontinued        12/13/20 1003 12/13/20 1256   12/13/20 0800  metroNIDAZOLE (FLAGYL) IVPB 500 mg        12/13/20 0241     12/12/20 2245  metroNIDAZOLE (FLAGYL) IVPB 500 mg        12/12/20 2235 12/13/20 0108   12/12/20 2200  ceFEPIme (MAXIPIME) 2 g in sodium chloride 0.9 % 100 mL IVPB  Status:  Discontinued        12/12/20 2024 12/12/20 2033   12/12/20 2130  vancomycin (VANCOCIN) IVPB 1000 mg/200 mL premix       "Followed by" Linked Group Details   12/12/20 2024 12/12/20 2348   12/12/20 2045  ceFEPIme (MAXIPIME) 2 g in sodium chloride 0.9 % 100 mL IVPB        12/12/20 2033     12/12/20 2030  vancomycin (VANCOCIN) IVPB 1000 mg/200 mL premix       "Followed by" Linked Group Details   12/12/20 2024 12/12/20 2225       Devices    LINES / TUBES:      Continuous Infusions: . sodium chloride 100 mL/hr at 12/15/20 0426  . cefTRIAXone (ROCEPHIN)  IV Stopped (12/15/20 1150)  . [START ON 12/16/2020] fluconazole (DIFLUCAN) IV    . fluconazole (DIFLUCAN) IV    . metronidazole Stopped (12/15/20 0939)     Objective: Vitals:   12/15/20 1225 12/15/20 1300 12/15/20 1400 12/15/20 1500  BP:  (!) 171/73 (!) 179/81 (!) 181/76  Pulse:  65 64 67  Resp:  (!) 21 20 (!) 21  Temp: 97.9 F (36.6 C)     TempSrc: Oral     SpO2:  97% 98% 96%  Weight:      Height:        Intake/Output Summary (Last 24 hours) at 12/15/2020 1535 Last data filed at 12/15/2020 0430 Gross per 24 hour  Intake --  Output 1150 ml  Net -1150 ml   Filed Weights   12/13/20 0134 12/14/20 0500  12/15/20 0427  Weight: 125.8 kg 126.2 kg 130.6 kg   Physical  Exam:  General: A/O x4, positive acute respiratory distress Eyes: negative scleral hemorrhage, negative anisocoria, negative icterus ENT: Negative Runny nose, negative gingival bleeding, Neck:  Negative scars, masses, torticollis, lymphadenopathy, JVD Lungs: diffuse decreased breath sounds bilaterally (may be partially secondary to patient's body habitus), without wheezes or crackles Cardiovascular: Regular rate and rhythm without murmur gallop or rub normal S1 and S2 Abdomen: OBESE, negative abdominal pain, nondistended, positive soft, bowel sounds, no rebound, no ascites, no appreciable mass Extremities: No significant cyanosis, clubbing, or edema bilateral lower extremities Skin: Negative rashes, lesions, ulcers Psychiatric:  Negative depression, negative anxiety, negative fatigue, negative mania  Central nervous system:  Cranial nerves II through XII intact, tongue/uvula midline, all extremities muscle strength 5/5, sensation intact throughout, negative dysarthria, negative expressive aphasia, negative receptive aphasia.  .     Data Reviewed: Care during the described time interval was provided by me .  I have reviewed this patient's available data, including medical history, events of note, physical examination, and all test results as part of my evaluation.  CBC: Recent Labs  Lab 12/12/20 1940 12/13/20 0258 12/14/20 0603 12/15/20 0233  WBC 6.5 12.6* 6.1 7.7  NEUTROABS 5.7  --  4.6 5.3  HGB 14.9 13.3 12.4* 12.2*  HCT 46.3 42.1 38.8* 39.3  MCV 97.5 100.2* 99.5 101.8*  PLT 198 177 138* 938*   Basic Metabolic Panel: Recent Labs  Lab 12/12/20 1940 12/13/20 0258 12/14/20 0603 12/15/20 0233  NA 131* 132* 135 132*  K 4.6 5.1 3.9 4.9  CL 97* 99 103 101  CO2 22 19* 19* 19*  GLUCOSE 278* 298* 150* 155*  BUN 27* 30* 27* 24*  CREATININE 1.57* 1.81* 1.34* 1.08  CALCIUM 9.0 8.8* 8.9 8.8*  MG  --  2.0 2.0 2.0  PHOS  --   --  2.1* 1.9*   GFR: Estimated Creatinine Clearance:  88.9 mL/min (by C-G formula based on SCr of 1.08 mg/dL). Liver Function Tests: Recent Labs  Lab 12/12/20 1940 12/13/20 0258 12/14/20 0603 12/15/20 0233  AST 188* 121* 132* 182*  ALT 199* 163* 127* 137*  ALKPHOS 74 76 66 90  BILITOT 4.1* 5.7* 5.0* 6.1*  PROT 6.8 5.9* 5.9* 5.8*  ALBUMIN 2.9* 2.7* 2.9* 2.7*   Recent Labs  Lab 12/12/20 1940  LIPASE 25   No results for input(s): AMMONIA in the last 168 hours. Coagulation Profile: Recent Labs  Lab 12/12/20 1940 12/13/20 0258  INR 1.2 1.4*   Cardiac Enzymes: No results for input(s): CKTOTAL, CKMB, CKMBINDEX, TROPONINI in the last 168 hours. BNP (last 3 results) No results for input(s): PROBNP in the last 8760 hours. HbA1C: Recent Labs    12/13/20 0258  HGBA1C 7.9*   CBG: Recent Labs  Lab 12/14/20 1943 12/14/20 2355 12/15/20 0403 12/15/20 0740 12/15/20 1122  GLUCAP 194* 166* 166* 160* 238*   Lipid Profile: No results for input(s): CHOL, HDL, LDLCALC, TRIG, CHOLHDL, LDLDIRECT in the last 72 hours. Thyroid Function Tests: No results for input(s): TSH, T4TOTAL, FREET4, T3FREE, THYROIDAB in the last 72 hours. Anemia Panel: Recent Labs    12/13/20 1031  FERRITIN 203  TIBC 254  IRON 18*   Sepsis Labs: Recent Labs  Lab 12/12/20 1940 12/12/20 2225 12/13/20 0258 12/13/20 0541  LATICACIDVEN 2.3* 2.2* 2.9* 3.0*    Recent Results (from the past 240 hour(s))  Culture, blood (Routine x 2)     Status: Abnormal  Collection Time: 12/12/20  7:45 PM   Specimen: BLOOD  Result Value Ref Range Status   Specimen Description   Final    BLOOD RIGHT ANTECUBITAL Performed at [facility], [address] [place], [place] [address]    Special Requests   Final    BOTTLES DRAWN AEROBIC AND ANAEROBIC Blood Culture adequate volume Performed at [facility], [address] [place], [place] [address]    Culture  Setup Time   Final    GRAM NEGATIVE RODS IN BOTH AEROBIC AND ANAEROBIC BOTTLES CRITICAL  RESULT CALLED TO, READ BACK BY AND VERIFIED WITHGuadlupe Spanish @ 9179 12/13/20 EB Performed at [facility], [place] [address] [place], [place] [address]    Culture KLEBSIELLA PNEUMONIAE (A)  Final   Report Status 12/15/2020 FINAL  Final   Organism ID, Bacteria KLEBSIELLA PNEUMONIAE  Final      Susceptibility   Klebsiella pneumoniae - MIC*    AMPICILLIN >=32 RESISTANT Resistant     CEFAZOLIN <=4 SENSITIVE Sensitive     CEFEPIME <=0.12 SENSITIVE Sensitive     CEFTAZIDIME <=1 SENSITIVE Sensitive     CEFTRIAXONE <=0.25 SENSITIVE Sensitive     CIPROFLOXACIN <=0.25 SENSITIVE Sensitive     GENTAMICIN <=1 SENSITIVE Sensitive     IMIPENEM <=0.25 SENSITIVE Sensitive     TRIMETH/SULFA <=20 SENSITIVE Sensitive     AMPICILLIN/SULBACTAM 8 SENSITIVE Sensitive     PIP/TAZO <=4 SENSITIVE Sensitive     * KLEBSIELLA PNEUMONIAE  Blood Culture ID Panel (Reflexed)     Status: Abnormal   Collection Time: 12/12/20  7:45 PM  Result Value Ref Range Status   Enterococcus faecalis NOT DETECTED NOT DETECTED Final   Enterococcus Faecium NOT DETECTED NOT DETECTED Final   Listeria monocytogenes NOT DETECTED NOT DETECTED Final   Staphylococcus species NOT DETECTED NOT DETECTED Final   Staphylococcus aureus (BCID) NOT DETECTED NOT DETECTED Final   Staphylococcus epidermidis NOT DETECTED NOT DETECTED Final   Staphylococcus lugdunensis NOT DETECTED NOT DETECTED Final   Streptococcus species NOT DETECTED NOT DETECTED Final   Streptococcus agalactiae NOT DETECTED NOT DETECTED Final   Streptococcus pneumoniae NOT DETECTED NOT DETECTED Final   Streptococcus pyogenes NOT DETECTED NOT DETECTED Final   A.calcoaceticus-baumannii NOT DETECTED NOT DETECTED Final   Bacteroides fragilis NOT DETECTED NOT DETECTED Final   Enterobacterales DETECTED (A) NOT DETECTED Final    Comment: Enterobacterales represent a large order of gram negative bacteria, not a single organism. CRITICAL RESULT CALLED TO, READ BACK BY AND VERIFIED  WITH: N GLOGOVAC @ 9794 12/13/20 EB    Enterobacter cloacae complex NOT DETECTED NOT DETECTED Final   Escherichia coli NOT DETECTED NOT DETECTED Final   Klebsiella aerogenes NOT DETECTED NOT DETECTED Final   Klebsiella oxytoca NOT DETECTED NOT DETECTED Final   Klebsiella pneumoniae DETECTED (A) NOT DETECTED Final    Comment: CRITICAL RESULT CALLED TO, READ BACK BY AND VERIFIED WITH: N GLOGOVAC @ 1153 12/13/20 EB    Proteus species NOT DETECTED NOT DETECTED Final   Salmonella species NOT DETECTED NOT DETECTED Final   Serratia marcescens NOT DETECTED NOT DETECTED Final   Haemophilus influenzae NOT DETECTED NOT DETECTED Final   Neisseria meningitidis NOT DETECTED NOT DETECTED Final   Pseudomonas aeruginosa NOT DETECTED NOT DETECTED Final   Stenotrophomonas maltophilia NOT DETECTED NOT DETECTED Final   Candida albicans NOT DETECTED NOT DETECTED Final   Candida auris NOT DETECTED NOT DETECTED Final   Candida glabrata NOT  DETECTED NOT DETECTED Final   Candida krusei NOT DETECTED NOT DETECTED Final   Candida parapsilosis NOT DETECTED NOT DETECTED Final   Candida tropicalis NOT DETECTED NOT DETECTED Final   Cryptococcus neoformans/gattii NOT DETECTED NOT DETECTED Final   CTX-M ESBL NOT DETECTED NOT DETECTED Final   Carbapenem resistance IMP NOT DETECTED NOT DETECTED Final   Carbapenem resistance KPC NOT DETECTED NOT DETECTED Final   Carbapenem resistance NDM NOT DETECTED NOT DETECTED Final   Carbapenem resist OXA 48 LIKE NOT DETECTED NOT DETECTED Final   Carbapenem resistance VIM NOT DETECTED NOT DETECTED Final    Comment: Performed at [facility], [place] [address], [place], [place] [address]  Resp Panel by RT-PCR (Flu A&B, Covid) Nasopharyngeal Swab     Status: None   Collection Time: 12/12/20  8:15 PM   Specimen: Nasopharyngeal Swab; Nasopharyngeal(NP) swabs in vial transport medium  Result Value Ref Range Status   SARS Coronavirus 2 by RT PCR NEGATIVE NEGATIVE Final    Comment:  (NOTE) SARS-CoV-2 target nucleic acids are NOT DETECTED.  The SARS-CoV-2 RNA is generally detectable in upper respiratory specimens during the acute phase of infection. The lowest concentration of SARS-CoV-2 viral copies this assay can detect is 138 copies/mL. A negative result does not preclude SARS-Cov-2 infection and should not be used as the sole basis for treatment or other patient management decisions. A negative result may occur with  improper specimen collection/handling, submission of specimen other than nasopharyngeal swab, presence of viral mutation(s) within the areas targeted by this assay, and inadequate number of viral copies(<138 copies/mL). A negative result must be combined with clinical observations, patient history, and epidemiological information. The expected result is Negative.  Fact Sheet for Patients:  EntrepreneurPulse.com.au  Fact Sheet for Healthcare Providers:  IncredibleEmployment.be  This test is no t yet approved or cleared by the [place] FDA and  has been authorized for detection and/or diagnosis of SARS-CoV-2 by FDA under an Emergency Use Authorization (EUA). This EUA will remain  in effect (meaning this test can be used) for the duration of the COVID-19 declaration under Section 564(b)(1) of the Act, 21 U.S.C.section 360bbb-3(b)(1), unless the authorization is terminated  or revoked sooner.       Influenza A by PCR NEGATIVE NEGATIVE Final   Influenza B by PCR NEGATIVE NEGATIVE Final    Comment: (NOTE) The Xpert Xpress SARS-CoV-2/FLU/RSV plus assay is intended as an aid in the diagnosis of influenza from Nasopharyngeal swab specimens and should not be used as a sole basis for treatment. Nasal washings and aspirates are unacceptable for Xpert Xpress SARS-CoV-2/FLU/RSV testing.  Fact Sheet for Patients: EntrepreneurPulse.com.au  Fact Sheet for Healthcare  Providers: IncredibleEmployment.be  This test is not yet approved or cleared by the [place] FDA and has been authorized for detection and/or diagnosis of SARS-CoV-2 by FDA under an Emergency Use Authorization (EUA). This EUA will remain in effect (meaning this test can be used) for the duration of the COVID-19 declaration under Section 564(b)(1) of the Act, 21 U.S.C. section 360bbb-3(b)(1), unless the authorization is terminated or revoked.  Performed at [facility], [address] [place], [place] [address]   Culture, blood (Routine x 2)     Status: Abnormal   Collection Time: 12/12/20  9:20 PM   Specimen: BLOOD  Result Value Ref Range Status   Specimen Description   Final    BLOOD Blood Culture adequate volume Performed at [facility], [address] [place], [place] [address]    Special Requests   Final    BOTTLES DRAWN AEROBIC AND ANAEROBIC LEFT ANTECUBITAL Performed at [facility], [address] [place], [place] [address]    Culture  Setup Time   Final    GRAM NEGATIVE RODS IN BOTH AEROBIC AND ANAEROBIC BOTTLES CRITICAL VALUE NOTED.  VALUE IS CONSISTENT WITH PREVIOUSLY REPORTED AND CALLED VALUE.    Culture (A)  Final    KLEBSIELLA PNEUMONIAE SUSCEPTIBILITIES PERFORMED ON PREVIOUS CULTURE WITHIN THE LAST 5 DAYS. Performed at [facility], [place] [address], [place], [place] [address]    Report Status 12/15/2020 FINAL  Final  MRSA PCR Screening     Status: None   Collection Time: 12/13/20  1:29 AM   Specimen: Nasopharyngeal  Result Value Ref Range Status   MRSA by PCR NEGATIVE NEGATIVE Final    Comment:        The GeneXpert MRSA Assay (FDA approved for NASAL specimens only), is one component of a comprehensive MRSA colonization surveillance program. It is not intended to diagnose MRSA infection nor to guide or monitor treatment for MRSA infections. Performed at [facility],  [place] [address], [place], [place] [address]   Urine Culture     Status: Abnormal   Collection Time: 12/14/20 12:39 AM   Specimen: Urine, Random  Result Value Ref Range Status   Specimen Description   Final    URINE, RANDOM Performed at [place] [address], [place], [place] [address]    Special Requests   Final    NONE Performed at [facility], [place] [address] [place], [place] [address]    Culture 40,000 COLONIES/mL YEAST (A)  Final   Report Status 12/15/2020 FINAL  Final         Radiology Studies: DG ERCP BILIARY & PANCREATIC DUCTS  Result Date: 12/14/2020 CLINICAL DATA:  ERCP with sphincterotomy. EXAM: ERCP TECHNIQUE: Multiple spot images obtained with the fluoroscopic device and submitted for interpretation post-procedure. FLUOROSCOPY TIME:  2 minutes, 44 seconds (86.3 mGy) COMPARISON:  MRCP-12/13/2020 FINDINGS: Eight spot intraoperative fluoroscopic images the right upper abdominal quadrant during ERCP are provided for review Initial image demonstrates an ERCP probe overlying the right upper abdominal quadrant Subsequent images demonstrate selective cannulation and opacification of the common bile duct which appears nondilated. Subsequent images demonstrate insufflation of a balloon within the central aspect of the CBD with subsequent sweeping and presumed sphincterotomy. There is opacification of the cystic duct with passage of contrast to the level of the gallbladder lumen. There is minimal opacification intrahepatic biliary tree which appears nondilated. There is no definitive opacification of the pancreatic duct. IMPRESSION: ERCP with biliary sweeping and presumed sphincterotomy as above. These images were submitted for radiologic interpretation only. Please see the procedural report for the amount of contrast and the fluoroscopy time utilized. Electronically Signed   By: Sandi Mariscal M.D.   On: 12/14/2020 10:18        Scheduled  Meds: . amLODipine  5 mg Oral Daily  . amLODipine  5 mg Oral Once  . chlorhexidine  15 mL Mouth Rinse BID  . Chlorhexidine Gluconate Cloth  6 each Topical Daily  . hydrALAZINE  25 mg Oral Q6H  . insulin aspart  0-15 Units Subcutaneous Q4H  . ipratropium-albuterol  3 mL Nebulization BID  . latanoprost  1 drop Left Eye QHS  . mouth rinse  15 mL Mouth Rinse q12n4p  Continuous Infusions: . sodium chloride 100 mL/hr at 12/15/20 0426  . cefTRIAXone (ROCEPHIN)  IV Stopped (12/15/20 1150)  . [START ON 12/16/2020] fluconazole (DIFLUCAN) IV    . fluconazole (DIFLUCAN) IV    . metronidazole Stopped (12/15/20 0939)     LOS: 2 days    Time spent:40 min    Skyelar Swigart, Geraldo Docker, MD [facility]   If 7PM-7AM, please contact night-coverage 12/15/2020, 3:35 PM

## 2020-12-15 NOTE — Progress Notes (Signed)
[facility] Gastroenterology Progress Note  Randy Liu 70 y.o. [birth year]  CC: Abnormal LFTs, ascending cholangitis   Subjective: Patient seen and examined at bedside.  Resting comfortably in the bed.  [name] is at bedside.  Patient denies any abdominal pain.  He is feeling better.  Continues to have dark urine.  Denies any blood in the stool or black stool.  ROS : Afebrile.  No acute chest pain   Objective: Vital signs in last 24 hours: Vitals:   12/15/20 1200 12/15/20 1225  BP: (!) 167/73   Pulse: 68   Resp: (!) 23   Temp:  97.9 F (36.6 C)  SpO2: 96%     Physical Exam:  General:   Obese, not in acute distress  Head:   Normocephalic, atraumatic  Eyes:   Mild scleral icterus noted  Lungs:    No visible respiratory distress  Heart:   Rate rhythm regular  Abdomen:    Obese abdomen, soft, nontender, bowel sounds present, no peritoneal signs  Neuro  alert and oriented x3  Psych  mood and affect normal    Lab Results: Recent Labs    12/14/20 0603 12/15/20 0233  NA 135 132*  K 3.9 4.9  CL 103 101  CO2 19* 19*  GLUCOSE 150* 155*  BUN 27* 24*  CREATININE 1.34* 1.08  CALCIUM 8.9 8.8*  MG 2.0 2.0  PHOS 2.1* 1.9*   Recent Labs    12/14/20 0603 12/15/20 0233  AST 132* 182*  ALT 127* 137*  ALKPHOS 66 90  BILITOT 5.0* 6.1*  PROT 5.9* 5.8*  ALBUMIN 2.9* 2.7*   Recent Labs    12/14/20 0603 12/15/20 0233  WBC 6.1 7.7  NEUTROABS 4.6 5.3  HGB 12.4* 12.2*  HCT 38.8* 39.3  MCV 99.5 101.8*  PLT 138* 137*   Recent Labs    12/12/20 1940 12/13/20 0258  LABPROT 14.9 16.6*  INR 1.2 1.4*      Assessment/Plan: -Ascending cholangitis.  Status post ERCP with sphincterotomy and removal of 3 stones yesterday.  Patient was found to have multiple gallstones as well as stone at the gallbladder neck. -Abnormal LFTs.  Concerning for underlying cirrhosis. -2 cystic lesions in the pancreas.  Recommended follow-up MRI/MRCP in 2 years -Bacteremia -History of  pulmonary embolism.  Anticoagulation on hold  Recommendations ------------------------- -Appreciate surgery consultation.  Recommend liver biopsy at the time of cholecystectomy to rule out underlying cirrhosis. -No significant change in LFTs today.  Hopefully LFTs will improve tomorrow.  Repeat LFTs tomorrow. -Okay to resume anticoagulation from GI standpoint.  Recommend to  touch base with surgery for planning for cholecystectomy and resuming anticoagulation.   -Continue antibiotics -Okay to advance diet from GI standpoint. -GI will follow  Kathi Der MD, FACP 12/15/2020, 2:11 PM  Contact #  [phone]

## 2020-12-15 NOTE — Progress Notes (Signed)
1 Day Post-Op   Subjective/Chief Complaint: Resting comfortably. No complaints   Objective: Vital signs in last 24 hours: Temp:  [97.7 F (36.5 C)-99.2 F (37.3 C)] 98.9 F (37.2 C) (04/03 0806) Pulse Rate:  [62-73] 67 (04/03 0627) Resp:  [13-23] 13 (04/03 0627) BP: (111-162)/(47-68) 162/68 (04/03 0627) SpO2:  [92 %-98 %] 92 % (04/03 0627) Weight:  [130.6 kg] 130.6 kg (04/03 0427) Last BM Date:  (PTA)  Intake/Output from previous day: 04/02 0701 - 04/03 0700 In: 900 [I.V.:800; IV Piggyback:100] Out: 1150 [Urine:1150] Intake/Output this shift: No intake/output data recorded.  General appearance: alert and cooperative Resp: clear to auscultation bilaterally Cardio: regular rate and rhythm GI: soft, nontender  Lab Results:  Recent Labs    12/14/20 0603 12/15/20 0233  WBC 6.1 7.7  HGB 12.4* 12.2*  HCT 38.8* 39.3  PLT 138* 137*   BMET Recent Labs    12/14/20 0603 12/15/20 0233  NA 135 132*  K 3.9 4.9  CL 103 101  CO2 19* 19*  GLUCOSE 150* 155*  BUN 27* 24*  CREATININE 1.34* 1.08  CALCIUM 8.9 8.8*   PT/INR Recent Labs    12/12/20 1940 12/13/20 0258  LABPROT 14.9 16.6*  INR 1.2 1.4*   ABG No results for input(s): PHART, HCO3 in the last 72 hours.  Invalid input(s): PCO2, PO2  Studies/Results: MR 3D Recon At Scanner  Result Date: 12/13/2020 CLINICAL DATA:  Elevated LFTs.  Gallbladder sludge.  Jaundice. EXAM: MRI ABDOMEN WITHOUT AND WITH CONTRAST (INCLUDING MRCP) TECHNIQUE: Multiplanar multisequence MR imaging of the abdomen was performed both before and after the administration of intravenous contrast. Heavily T2-weighted images of the biliary and pancreatic ducts were obtained, and three-dimensional MRCP images were rendered by post processing. CONTRAST:  10mL GADAVIST GADOBUTROL 1 MMOL/ML IV SOLN COMPARISON:  Prior CT from December 12, 2020 and ultrasound of December 13, 2020. FINDINGS: Lower chest: Incidental imaging of the lung bases, limited on MRI without  effusion or consolidation. Hepatobiliary: Marked hepatic steatosis. No focal, suspicious hepatic lesion. No pericholecystic fluid. Sludge in the dependent gallbladder. No sign of biliary duct dilation. Mildly heterogeneous parenchymal enhancement pattern without focal, suspicious hepatic lesion. Most striking on the arterial phase. The portal vein is patent. Hepatic veins are patent. Common bile duct with intermediate signal seen in the lumen of the distal common bile duct. Mild dilation, between 6 and 7 mm of the distal common bile duct but without dilation of the proximal common bile duct or intrahepatic biliary tree. Mildly limited assessment on MRCP due to a relative lack of signal and some motion related artifact. Mildly increased T1 signal in the dependent duct as well. Pancreas: Pancreatic atrophy without ductal dilation or sign of inflammation. 12 x 12 mm lesion in the head of the atrophic pancreas with cystic features. About the body of the pancreas a 7 mm lesion also with cystic features. Spleen:  Normal Adrenals/Urinary Tract: Normal adrenal glands. The large cyst from the lower pole the LEFT kidney extending laterally. Small cyst on the RIGHT. No hydronephrosis. Stomach/Bowel: No sign of acute gastrointestinal process to the extent evaluated on abdominal MRI not performed for bowel evaluation. Vascular/Lymphatic: Abdominal vasculature is patent without signs of dilation. Mild celiac nodal prominence/top-normal size 11 mm node behind the head of the pancreas on image 66 of series 32 and another small celiac node along the course of the common hepatic artery on image 60 of series 32. Other:  No ascites Musculoskeletal: No suspicious bone lesions identified.  IMPRESSION: 1. Sludge in the dependent gallbladder and sludge in the distal common bile duct. No intrahepatic biliary ductal dilation and mild dilation of the distal common bile duct as described, significance uncertain given lack of intrahepatic  biliary duct distension and signs of profound hepatic steatosis. Heterogeneous enhancement pattern in the liver without focal lesion raising the question of acute hepatitis. Correlate with laboratory values to determine significance of above findings. 2. Two cystic lesions in the pancreas, largest of which is in the head of the pancreas measuring up to 12 mm. These could represent small side branch IPMN's. Suggest follow-up MRI/MRCP in 2 years to assess for stability. 3. Mild celiac nodal prominence/to-normal size node along the course of the common hepatic artery. This could be seen in the setting of hepatic dysfunction/liver disease. Electronically Signed   By: Donzetta Kohut M.D.   On: 12/13/2020 15:24   DG ERCP BILIARY & PANCREATIC DUCTS  Result Date: 12/14/2020 CLINICAL DATA:  ERCP with sphincterotomy. EXAM: ERCP TECHNIQUE: Multiple spot images obtained with the fluoroscopic device and submitted for interpretation post-procedure. FLUOROSCOPY TIME:  2 minutes, 44 seconds (86.3 mGy) COMPARISON:  MRCP-12/13/2020 FINDINGS: Eight spot intraoperative fluoroscopic images the right upper abdominal quadrant during ERCP are provided for review Initial image demonstrates an ERCP probe overlying the right upper abdominal quadrant Subsequent images demonstrate selective cannulation and opacification of the common bile duct which appears nondilated. Subsequent images demonstrate insufflation of a balloon within the central aspect of the CBD with subsequent sweeping and presumed sphincterotomy. There is opacification of the cystic duct with passage of contrast to the level of the gallbladder lumen. There is minimal opacification intrahepatic biliary tree which appears nondilated. There is no definitive opacification of the pancreatic duct. IMPRESSION: ERCP with biliary sweeping and presumed sphincterotomy as above. These images were submitted for radiologic interpretation only. Please see the procedural report for the  amount of contrast and the fluoroscopy time utilized. Electronically Signed   By: Simonne Come M.D.   On: 12/14/2020 10:18   MR ABDOMEN MRCP W WO CONTAST  Result Date: 12/13/2020 CLINICAL DATA:  Elevated LFTs.  Gallbladder sludge.  Jaundice. EXAM: MRI ABDOMEN WITHOUT AND WITH CONTRAST (INCLUDING MRCP) TECHNIQUE: Multiplanar multisequence MR imaging of the abdomen was performed both before and after the administration of intravenous contrast. Heavily T2-weighted images of the biliary and pancreatic ducts were obtained, and three-dimensional MRCP images were rendered by post processing. CONTRAST:  53mL GADAVIST GADOBUTROL 1 MMOL/ML IV SOLN COMPARISON:  Prior CT from December 12, 2020 and ultrasound of December 13, 2020. FINDINGS: Lower chest: Incidental imaging of the lung bases, limited on MRI without effusion or consolidation. Hepatobiliary: Marked hepatic steatosis. No focal, suspicious hepatic lesion. No pericholecystic fluid. Sludge in the dependent gallbladder. No sign of biliary duct dilation. Mildly heterogeneous parenchymal enhancement pattern without focal, suspicious hepatic lesion. Most striking on the arterial phase. The portal vein is patent. Hepatic veins are patent. Common bile duct with intermediate signal seen in the lumen of the distal common bile duct. Mild dilation, between 6 and 7 mm of the distal common bile duct but without dilation of the proximal common bile duct or intrahepatic biliary tree. Mildly limited assessment on MRCP due to a relative lack of signal and some motion related artifact. Mildly increased T1 signal in the dependent duct as well. Pancreas: Pancreatic atrophy without ductal dilation or sign of inflammation. 12 x 12 mm lesion in the head of the atrophic pancreas with cystic features. About the body  of the pancreas a 7 mm lesion also with cystic features. Spleen:  Normal Adrenals/Urinary Tract: Normal adrenal glands. The large cyst from the lower pole the LEFT kidney extending  laterally. Small cyst on the RIGHT. No hydronephrosis. Stomach/Bowel: No sign of acute gastrointestinal process to the extent evaluated on abdominal MRI not performed for bowel evaluation. Vascular/Lymphatic: Abdominal vasculature is patent without signs of dilation. Mild celiac nodal prominence/top-normal size 11 mm node behind the head of the pancreas on image 66 of series 32 and another small celiac node along the course of the common hepatic artery on image 60 of series 32. Other:  No ascites Musculoskeletal: No suspicious bone lesions identified. IMPRESSION: 1. Sludge in the dependent gallbladder and sludge in the distal common bile duct. No intrahepatic biliary ductal dilation and mild dilation of the distal common bile duct as described, significance uncertain given lack of intrahepatic biliary duct distension and signs of profound hepatic steatosis. Heterogeneous enhancement pattern in the liver without focal lesion raising the question of acute hepatitis. Correlate with laboratory values to determine significance of above findings. 2. Two cystic lesions in the pancreas, largest of which is in the head of the pancreas measuring up to 12 mm. These could represent small side branch IPMN's. Suggest follow-up MRI/MRCP in 2 years to assess for stability. 3. Mild celiac nodal prominence/to-normal size node along the course of the common hepatic artery. This could be seen in the setting of hepatic dysfunction/liver disease. Electronically Signed   By: Donzetta Kohut M.D.   On: 12/13/2020 15:24   ECHOCARDIOGRAM COMPLETE  Result Date: 12/13/2020    ECHOCARDIOGRAM REPORT   Patient Name:   Randy Liu Date of Exam: 12/13/2020 Medical Rec #:  [id number]          Height:       73.0 in Accession #:    [id number]         Weight:       277.3 lb Date of Birth:  [birth year]          BSA:          2.472 m Patient Age:    69 years           BP:           99/48 mmHg Patient Gender: M                  HR:           68 bpm.  Exam Location:  Inpatient Procedure: 2D Echo, Cardiac Doppler, Color Doppler and Intracardiac            Opacification Agent Indications:    CHF-Acute Systolic I50.21  History:        Patient has no prior history of Echocardiogram examinations.                 Risk Factors:Hypertension and Diabetes.  Sonographer:    Eulah Pont RDCS Referring Phys: [id number] CURTIS J WOODS IMPRESSIONS  1. Left ventricular ejection fraction, by estimation, is 60 to 65%. The left ventricle has normal function. The left ventricle has no regional wall motion abnormalities. There is mild left ventricular hypertrophy. Left ventricular diastolic parameters are consistent with Grade II diastolic dysfunction (pseudonormalization).  2. Right ventricular systolic function is normal. The right ventricular size is normal. There is normal pulmonary artery systolic pressure.  3. The mitral valve is normal in structure. Trivial mitral valve regurgitation. No evidence of mitral stenosis.  4. The aortic  valve is normal in structure. Aortic valve regurgitation is not visualized. No aortic stenosis is present.  5. The inferior vena cava is normal in size with greater than 50% respiratory variability, suggesting right atrial pressure of 3 mmHg. FINDINGS  Left Ventricle: Left ventricular ejection fraction, by estimation, is 60 to 65%. The left ventricle has normal function. The left ventricle has no regional wall motion abnormalities. Definity contrast agent was given IV to delineate the left ventricular  endocardial borders. The left ventricular internal cavity size was normal in size. There is mild left ventricular hypertrophy. Left ventricular diastolic parameters are consistent with Grade II diastolic dysfunction (pseudonormalization). Right Ventricle: The right ventricular size is normal. No increase in right ventricular wall thickness. Right ventricular systolic function is normal. There is normal pulmonary artery systolic pressure. The tricuspid  regurgitant velocity is 2.02 m/s, and  with an assumed right atrial pressure of 3 mmHg, the estimated right ventricular systolic pressure is 19.3 mmHg. Left Atrium: Left atrial size was normal in size. Right Atrium: Right atrial size was normal in size. Pericardium: There is no evidence of pericardial effusion. Mitral Valve: The mitral valve is normal in structure. Trivial mitral valve regurgitation. No evidence of mitral valve stenosis. Tricuspid Valve: The tricuspid valve is normal in structure. Tricuspid valve regurgitation is trivial. No evidence of tricuspid stenosis. Aortic Valve: The aortic valve is normal in structure. Aortic valve regurgitation is not visualized. No aortic stenosis is present. Pulmonic Valve: The pulmonic valve was normal in structure. Pulmonic valve regurgitation is not visualized. No evidence of pulmonic stenosis. Aorta: The aortic root is normal in size and structure. Venous: The inferior vena cava is normal in size with greater than 50% respiratory variability, suggesting right atrial pressure of 3 mmHg. IAS/Shunts: No atrial level shunt detected by color flow Doppler.  LEFT VENTRICLE PLAX 2D LVIDd:         4.10 cm  Diastology LVIDs:         2.80 cm  LV e' medial:    3.89 cm/s LV PW:         1.20 cm  LV E/e' medial:  30.6 LV IVS:        1.10 cm  LV e' lateral:   4.78 cm/s LVOT diam:     1.90 cm  LV E/e' lateral: 24.9 LV SV:         62 LV SV Index:   25 LVOT Area:     2.84 cm  RIGHT VENTRICLE RV S prime:     10.60 cm/s TAPSE (M-mode): 1.5 cm LEFT ATRIUM             Index       RIGHT ATRIUM           Index LA diam:        3.10 cm 1.25 cm/m  RA Area:     11.00 cm LA Vol (A2C):   35.1 ml 14.20 ml/m RA Volume:   23.40 ml  9.46 ml/m LA Vol (A4C):   35.5 ml 14.36 ml/m LA Biplane Vol: 35.1 ml 14.20 ml/m  AORTIC VALVE LVOT Vmax:   91.60 cm/s LVOT Vmean:  70.200 cm/s LVOT VTI:    0.219 m  AORTA Ao Root diam: 3.50 cm Ao Asc diam:  3.30 cm MITRAL VALVE                TRICUSPID VALVE MV Area  (PHT): 3.91 cm     TR Peak grad:   16.3  mmHg MV Decel Time: 194 msec     TR Vmax:        202.00 cm/s MV E velocity: 119.00 cm/s MV A velocity: 91.70 cm/s   SHUNTS MV E/A ratio:  1.30         Systemic VTI:  0.22 m                             Systemic Diam: 1.90 cm Donato SchultzMark Skains MD Electronically signed by Donato SchultzMark Skains MD Signature Date/Time: 12/13/2020/1:21:12 PM    Final    VAS US LOWER EXTREMITY VENOUS (DVT)  Result Date: 12/13/2020  Lower Venous DVT Study Indications: History of PE, new SOB.  Anticoagulation: Long-term eliquis, being held for testing. Limitations: Body habitus and poor ultrasound/tissue interface. Comparison Study: No prior studies. Performing Technologist: Jean Rosenthalachel Hodge RDMS,RVT  Examination Guidelines: A complete evaluation includes B-mode imaging, spectral Doppler, color Doppler, and power Doppler as needed of all accessible portions of each vessel. Bilateral testing is considered an integral part of a complete examination. Limited examinations for reoccurring indications may be performed as noted. The reflux portion of the exam is performed with the patient in reverse Trendelenburg.  +---------+---------------+---------+-----------+----------+-------------------+ RIGHT    CompressibilityPhasicitySpontaneityPropertiesThrombus Aging      +---------+---------------+---------+-----------+----------+-------------------+ CFV      Full           Yes      Yes                                      +---------+---------------+---------+-----------+----------+-------------------+ SFJ      Full                                                             +---------+---------------+---------+-----------+----------+-------------------+ FV Prox  Full                                                             +---------+---------------+---------+-----------+----------+-------------------+ FV Mid   Full                                                              +---------+---------------+---------+-----------+----------+-------------------+ FV DistalFull                                                             +---------+---------------+---------+-----------+----------+-------------------+ PFV      Full                                                             +---------+---------------+---------+-----------+----------+-------------------+  POP      Full           Yes      Yes                                      +---------+---------------+---------+-----------+----------+-------------------+ PTV                     Yes      Yes                                      +---------+---------------+---------+-----------+----------+-------------------+ PERO                                                  Not well visualized +---------+---------------+---------+-----------+----------+-------------------+   +---------+---------------+---------+-----------+----------+-------------------+ LEFT     CompressibilityPhasicitySpontaneityPropertiesThrombus Aging      +---------+---------------+---------+-----------+----------+-------------------+ CFV      Full           Yes      Yes                                      +---------+---------------+---------+-----------+----------+-------------------+ SFJ      Full                                                             +---------+---------------+---------+-----------+----------+-------------------+ FV Prox  Full                                                             +---------+---------------+---------+-----------+----------+-------------------+ FV Mid   Full                                                             +---------+---------------+---------+-----------+----------+-------------------+ FV DistalFull                                                             +---------+---------------+---------+-----------+----------+-------------------+  PFV      Full                                                             +---------+---------------+---------+-----------+----------+-------------------+ POP      Full  Yes      Yes                                      +---------+---------------+---------+-----------+----------+-------------------+ PTV      Full                                         Some segments not                                                         well visualized     +---------+---------------+---------+-----------+----------+-------------------+ PERO     Full                                                             +---------+---------------+---------+-----------+----------+-------------------+     Summary: RIGHT: - There is no evidence of deep vein thrombosis in the lower extremity. However, portions of this examination were limited- see technologist comments above.  - No cystic structure found in the popliteal fossa.  LEFT: - There is no evidence of deep vein thrombosis in the lower extremity. However, portions of this examination were limited- see technologist comments above.  - No cystic structure found in the popliteal fossa.  *See table(s) above for measurements and observations. Electronically signed by Sherald Hess MD on 12/13/2020 at 4:55:00 PM.    Final     Anti-infectives: Anti-infectives (From admission, onward)   Start     Dose/Rate Route Frequency Ordered Stop   12/15/20 0900  cefTRIAXone (ROCEPHIN) 2 g in sodium chloride 0.9 % 100 mL IVPB        2 g 200 mL/hr over 30 Minutes Intravenous Every 24 hours 12/15/20 0814     12/13/20 2200  vancomycin (VANCOREADY) IVPB 1500 mg/300 mL  Status:  Discontinued        1,500 mg 150 mL/hr over 120 Minutes Intravenous Every 24 hours 12/12/20 2054 12/13/20 1003   12/13/20 2200  vancomycin (VANCOREADY) IVPB 1250 mg/250 mL  Status:  Discontinued        1,250 mg 166.7 mL/hr over 90 Minutes Intravenous Every 24 hours 12/13/20  1003 12/13/20 1256   12/13/20 0800  metroNIDAZOLE (FLAGYL) IVPB 500 mg        500 mg 100 mL/hr over 60 Minutes Intravenous Every 8 hours 12/13/20 0241     12/12/20 2245  metroNIDAZOLE (FLAGYL) IVPB 500 mg        500 mg 100 mL/hr over 60 Minutes Intravenous  Once 12/12/20 2235 12/13/20 0108   12/12/20 2200  ceFEPIme (MAXIPIME) 2 g in sodium chloride 0.9 % 100 mL IVPB  Status:  Discontinued        2 g 200 mL/hr over 30 Minutes Intravenous Every 12 hours 12/12/20 2024 12/12/20 2033   12/12/20 2130  vancomycin (VANCOCIN) IVPB 1000 mg/200 mL premix       "Followed by" Linked Group Details   1,000 mg 200 mL/hr over 60 Minutes Intravenous  Once  12/12/20 2024 12/12/20 2348   12/12/20 2045  ceFEPIme (MAXIPIME) 2 g in sodium chloride 0.9 % 100 mL IVPB  Status:  Discontinued        2 g 200 mL/hr over 30 Minutes Intravenous Every 12 hours 12/12/20 2033 12/15/20 0814   12/12/20 2030  vancomycin (VANCOCIN) IVPB 1000 mg/200 mL premix       "Followed by" Linked Group Details   1,000 mg 200 mL/hr over 60 Minutes Intravenous  Once 12/12/20 2024 12/12/20 2225      Assessment/Plan: s/p Procedure(s): ENDOSCOPIC RETROGRADE CHOLANGIOPANCREATOGRAPHY (ERCP) WITH PROPOFOL (N/A) SPHINCTEROTOMY BIOPSY REMOVAL OF STONES Advance diet  Cholangitis: improving on abx and with successful ERCP to decompress biliary system Still has gallstones. Will allow to continue to recover from the cholangitis. May benefit from having gallbladder removed at some point. Will discuss timing of this with the surgery team  LOS: 2 days    Chevis Pretty III 12/15/2020

## 2020-12-16 DIAGNOSIS — J9601 Acute respiratory failure with hypoxia: Secondary | ICD-10-CM | POA: Diagnosis not present

## 2020-12-16 DIAGNOSIS — B961 Klebsiella pneumoniae [K. pneumoniae] as the cause of diseases classified elsewhere: Secondary | ICD-10-CM

## 2020-12-16 DIAGNOSIS — K8309 Other cholangitis: Secondary | ICD-10-CM | POA: Diagnosis not present

## 2020-12-16 DIAGNOSIS — E1140 Type 2 diabetes mellitus with diabetic neuropathy, unspecified: Secondary | ICD-10-CM | POA: Diagnosis not present

## 2020-12-16 DIAGNOSIS — E871 Hypo-osmolality and hyponatremia: Secondary | ICD-10-CM | POA: Diagnosis not present

## 2020-12-16 LAB — COMPREHENSIVE METABOLIC PANEL
ALT: 121 U/L — ABNORMAL HIGH (ref 0–44)
AST: 130 U/L — ABNORMAL HIGH (ref 15–41)
Albumin: 2.3 g/dL — ABNORMAL LOW (ref 3.5–5.0)
Alkaline Phosphatase: 107 U/L (ref 38–126)
Anion gap: 9 (ref 5–15)
BUN: 15 mg/dL (ref 8–23)
CO2: 21 mmol/L — ABNORMAL LOW (ref 22–32)
Calcium: 8.8 mg/dL — ABNORMAL LOW (ref 8.9–10.3)
Chloride: 102 mmol/L (ref 98–111)
Creatinine, Ser: 0.77 mg/dL (ref 0.61–1.24)
GFR, Estimated: >60 mL/min (ref >60)
Glucose, Bld: 149 mg/dL — ABNORMAL HIGH (ref 70–99)
Potassium: 3.3 mmol/L — ABNORMAL LOW (ref 3.5–5.1)
Sodium: 132 mmol/L — ABNORMAL LOW (ref 135–145)
Total Bilirubin: 4.8 mg/dL — ABNORMAL HIGH (ref 0.3–1.2)
Total Protein: 5.6 g/dL — ABNORMAL LOW (ref 6.5–8.1)

## 2020-12-16 LAB — GLUCOSE, CAPILLARY
Glucose-Capillary: 150 mg/dL — ABNORMAL HIGH (ref 70–99)
Glucose-Capillary: 153 mg/dL — ABNORMAL HIGH (ref 70–99)
Glucose-Capillary: 158 mg/dL — ABNORMAL HIGH (ref 70–99)
Glucose-Capillary: 180 mg/dL — ABNORMAL HIGH (ref 70–99)
Glucose-Capillary: 194 mg/dL — ABNORMAL HIGH (ref 70–99)
Glucose-Capillary: 229 mg/dL — ABNORMAL HIGH (ref 70–99)

## 2020-12-16 LAB — CBC WITH DIFFERENTIAL/PLATELET
Abs Immature Granulocytes: 0.48 10*3/uL — ABNORMAL HIGH (ref 0.00–0.07)
Basophils Absolute: 0.1 10*3/uL (ref 0.0–0.1)
Basophils Relative: 1 %
Eosinophils Absolute: 0.1 10*3/uL (ref 0.0–0.5)
Eosinophils Relative: 1 %
HCT: 37.6 % — ABNORMAL LOW (ref 39.0–52.0)
Hemoglobin: 12 g/dL — ABNORMAL LOW (ref 13.0–17.0)
Immature Granulocytes: 7 %
Lymphocytes Relative: 15 %
Lymphs Abs: 1.1 10*3/uL (ref 0.7–4.0)
MCH: 31.6 pg (ref 26.0–34.0)
MCHC: 31.9 g/dL (ref 30.0–36.0)
MCV: 98.9 fL (ref 80.0–100.0)
Monocytes Absolute: 0.8 10*3/uL (ref 0.1–1.0)
Monocytes Relative: 10 %
Neutro Abs: 4.8 10*3/uL (ref 1.7–7.7)
Neutrophils Relative %: 66 %
Platelets: 165 10*3/uL (ref 150–400)
RBC: 3.8 MIL/uL — ABNORMAL LOW (ref 4.22–5.81)
RDW: 16.2 % — ABNORMAL HIGH (ref 11.5–15.5)
WBC: 7.3 10*3/uL (ref 4.0–10.5)
nRBC: 0 % (ref 0.0–0.2)

## 2020-12-16 LAB — MAGNESIUM: Magnesium: 1.7 mg/dL (ref 1.7–2.4)

## 2020-12-16 LAB — PHOSPHORUS: Phosphorus: 1.2 mg/dL — ABNORMAL LOW (ref 2.5–4.6)

## 2020-12-16 MED ORDER — INSULIN ASPART 100 UNIT/ML ~~LOC~~ SOLN
0.0000 [IU] | Freq: Every day | SUBCUTANEOUS | Status: DC
  Administered 2020-12-17: 3 [IU] via SUBCUTANEOUS
  Administered 2020-12-18: 5 [IU] via SUBCUTANEOUS
  Administered 2020-12-19: 4 [IU] via SUBCUTANEOUS
  Administered 2020-12-20 – 2020-12-21 (×2): 2 [IU] via SUBCUTANEOUS

## 2020-12-16 MED ORDER — MAGNESIUM SULFATE 2 GM/50ML IV SOLN
2.0000 g | Freq: Once | INTRAVENOUS | Status: AC
  Administered 2020-12-16: 2 g via INTRAVENOUS
  Filled 2020-12-16: qty 50

## 2020-12-16 MED ORDER — POTASSIUM PHOSPHATES 15 MMOLE/5ML IV SOLN
20.0000 mmol | Freq: Once | INTRAVENOUS | Status: AC
  Administered 2020-12-16: 20 mmol via INTRAVENOUS
  Filled 2020-12-16: qty 6.67

## 2020-12-16 MED ORDER — INSULIN ASPART 100 UNIT/ML ~~LOC~~ SOLN
0.0000 [IU] | Freq: Three times a day (TID) | SUBCUTANEOUS | Status: DC
  Administered 2020-12-16: 4 [IU] via SUBCUTANEOUS
  Administered 2020-12-16 – 2020-12-17 (×3): 7 [IU] via SUBCUTANEOUS
  Administered 2020-12-17: 4 [IU] via SUBCUTANEOUS
  Administered 2020-12-18 (×2): 15 [IU] via SUBCUTANEOUS
  Administered 2020-12-18: 11 [IU] via SUBCUTANEOUS
  Administered 2020-12-19 (×2): 7 [IU] via SUBCUTANEOUS
  Administered 2020-12-19: 20 [IU] via SUBCUTANEOUS
  Administered 2020-12-20: 11 [IU] via SUBCUTANEOUS
  Administered 2020-12-20: 15 [IU] via SUBCUTANEOUS
  Administered 2020-12-20 – 2020-12-21 (×2): 11 [IU] via SUBCUTANEOUS
  Administered 2020-12-21 (×2): 15 [IU] via SUBCUTANEOUS
  Administered 2020-12-22: 4 [IU] via SUBCUTANEOUS
  Administered 2020-12-22: 7 [IU] via SUBCUTANEOUS
  Administered 2020-12-22: 15 [IU] via SUBCUTANEOUS
  Administered 2020-12-23: 11 [IU] via SUBCUTANEOUS
  Administered 2020-12-23: 4 [IU] via SUBCUTANEOUS

## 2020-12-16 MED ORDER — FLUCONAZOLE 100 MG PO TABS
100.0000 mg | ORAL_TABLET | Freq: Every day | ORAL | Status: DC
  Administered 2020-12-16 – 2020-12-18 (×3): 100 mg via ORAL
  Filled 2020-12-16 (×3): qty 1

## 2020-12-16 NOTE — Progress Notes (Signed)
PT Cancellation Note  Patient Details Name: Randy Liu MRN: [id number] DOB: [birth year]   Cancelled Treatment:    Reason Eval/Treat Not Completed: Fatigue/lethargy limiting ability to participate, wife at bedside, stated plans for surgery tomorrow. Will follow and  procede with PT post op if indicated.   Rada Hay 12/16/2020, 2:22 PM Blanchard Kelch PT Acute Rehabilitation Services Pager [phone] Office [phone]

## 2020-12-16 NOTE — Progress Notes (Signed)
Subjective: No abdominal pain.  Objective: Vital signs in last 24 hours: Temp:  [97.5 F (36.4 C)-98.7 F (37.1 C)] 98.3 F (36.8 C) (04/04 0519) Pulse Rate:  [64-87] 76 (04/04 0519) Resp:  [18-23] 20 (04/04 0519) BP: (132-181)/(57-81) 134/69 (04/04 0519) SpO2:  [92 %-98 %] 95 % (04/04 0819) Weight:  [132.1 kg] 132.1 kg (04/04 0500) Weight change: 1.5 kg Last BM Date: 12/12/20  PE: GEN:  Overweight, chronically ill-appearing, somewhat tachypneic at rest ABD:  Soft, protuberant, non-tender  Lab Results: CBC    Component Value Date/Time   WBC 7.3 12/16/2020 0349   RBC 3.80 (L) 12/16/2020 0349   HGB 12.0 (L) 12/16/2020 0349   HCT 37.6 (L) 12/16/2020 0349   PLT 165 12/16/2020 0349   MCV 98.9 12/16/2020 0349   MCH 31.6 12/16/2020 0349   MCHC 31.9 12/16/2020 0349   RDW 16.2 (H) 12/16/2020 0349   LYMPHSABS 1.1 12/16/2020 0349   MONOABS 0.8 12/16/2020 0349   EOSABS 0.1 12/16/2020 0349   BASOSABS 0.1 12/16/2020 0349   CMP     Component Value Date/Time   NA 132 (L) 12/16/2020 0349   K 3.3 (L) 12/16/2020 0349   CL 102 12/16/2020 0349   CO2 21 (L) 12/16/2020 0349   GLUCOSE 149 (H) 12/16/2020 0349   BUN 15 12/16/2020 0349   CREATININE 0.77 12/16/2020 0349   CALCIUM 8.8 (L) 12/16/2020 0349   PROT 5.6 (L) 12/16/2020 0349   ALBUMIN 2.3 (L) 12/16/2020 0349   AST 130 (H) 12/16/2020 0349   ALT 121 (H) 12/16/2020 0349   ALKPHOS 107 12/16/2020 0349   BILITOT 4.8 (H) 12/16/2020 0349   GFRNONAA >60 12/16/2020 0349   Assessment:  1.  Choledocholithiasis with cholangitis.  Sphincterotomy and stone extraction over weekend. 2.  Elevated LFTs, downtrending.   3.  Hepatic steatosis.  Possible advanced fibrosis vs cirrhosis.  Plan:  1.  No further GI input at this point.   2.  Defer to general surgery and TRH regarding timing of cholecystectomy and timing to restart anticoagulation (which will in turn depend on timing of surgery). 3.  Liver biopsy at time of cholecystectomy  advised. 4.  [facility] will sign off; patient needs outpatient follow-up pending results of liver biopsy; thanks for the consultation; please call with any questions.   Freddy Jaksch 12/16/2020, 10:22 AM   Cell [phone] If no answer or after 5 PM call [phone]

## 2020-12-16 NOTE — Plan of Care (Signed)

## 2020-12-16 NOTE — Progress Notes (Signed)
[facility] Progress Note  2 Days Post-Op  Subjective: CC-  Overall feeling better. Denies abdominal pain, n/v. Tolerating full liquids.  WBC 7.3, afebrile. LFTs trending down with AST 130, ALT 121, Alk phos 107, and Tbili 4.8<<6.1.  Objective: Vital signs in last 24 hours: Temp:  [97.5 F (36.4 C)-98.7 F (37.1 C)] 98.3 F (36.8 C) (04/04 0519) Pulse Rate:  [64-87] 76 (04/04 0519) Resp:  [18-25] 20 (04/04 0519) BP: (132-187)/(57-81) 134/69 (04/04 0519) SpO2:  [92 %-99 %] 95 % (04/04 0819) Weight:  [132.1 kg] 132.1 kg (04/04 0500) Last BM Date: 12/12/20  Intake/Output from previous day: 04/03 0701 - 04/04 0700 In: 3180.8 [P.O.:360; I.V.:2122.1; IV Piggyback:698.8] Out: 1500 [Urine:1500] Intake/Output this shift: No intake/output data recorded.  PE: Gen:  Alert, NAD, pleasant HEENT: EOM's intact, pupils equal and round Card:  RRR Pulm:  Decreased breath sounds bilaterally, no wheezing or rhonchi, mild tachypnea on 2.5L [place] Abd: obese, soft, NT, no HSM, soft umbilical hernia without overlying skin changes Skin: no rashes noted, warm and dry  Lab Results:  Recent Labs    12/15/20 0233 12/16/20 0349  WBC 7.7 7.3  HGB 12.2* 12.0*  HCT 39.3 37.6*  PLT 137* 165   BMET Recent Labs    12/15/20 0233 12/16/20 0349  NA 132* 132*  K 4.9 3.3*  CL 101 102  CO2 19* 21*  GLUCOSE 155* 149*  BUN 24* 15  CREATININE 1.08 0.77  CALCIUM 8.8* 8.8*   PT/INR No results for input(s): LABPROT, INR in the last 72 hours. CMP     Component Value Date/Time   NA 132 (L) 12/16/2020 0349   K 3.3 (L) 12/16/2020 0349   CL 102 12/16/2020 0349   CO2 21 (L) 12/16/2020 0349   GLUCOSE 149 (H) 12/16/2020 0349   BUN 15 12/16/2020 0349   CREATININE 0.77 12/16/2020 0349   CALCIUM 8.8 (L) 12/16/2020 0349   PROT 5.6 (L) 12/16/2020 0349   ALBUMIN 2.3 (L) 12/16/2020 0349   AST 130 (H) 12/16/2020 0349   ALT 121 (H) 12/16/2020 0349   ALKPHOS 107 12/16/2020 0349   BILITOT 4.8  (H) 12/16/2020 0349   GFRNONAA >60 12/16/2020 0349   Lipase     Component Value Date/Time   LIPASE 25 12/12/2020 1940       Studies/Results: DG ERCP BILIARY & PANCREATIC DUCTS  Result Date: 12/14/2020 CLINICAL DATA:  ERCP with sphincterotomy. EXAM: ERCP TECHNIQUE: Multiple spot images obtained with the fluoroscopic device and submitted for interpretation post-procedure. FLUOROSCOPY TIME:  2 minutes, 44 seconds (86.3 mGy) COMPARISON:  MRCP-12/13/2020 FINDINGS: Eight spot intraoperative fluoroscopic images the right upper abdominal quadrant during ERCP are provided for review Initial image demonstrates an ERCP probe overlying the right upper abdominal quadrant Subsequent images demonstrate selective cannulation and opacification of the common bile duct which appears nondilated. Subsequent images demonstrate insufflation of a balloon within the central aspect of the CBD with subsequent sweeping and presumed sphincterotomy. There is opacification of the cystic duct with passage of contrast to the level of the gallbladder lumen. There is minimal opacification intrahepatic biliary tree which appears nondilated. There is no definitive opacification of the pancreatic duct. IMPRESSION: ERCP with biliary sweeping and presumed sphincterotomy as above. These images were submitted for radiologic interpretation only. Please see the procedural report for the amount of contrast and the fluoroscopy time utilized. Electronically Signed   By: Sandi Mariscal M.D.   On: 12/14/2020 10:18    Anti-infectives: Anti-infectives (From admission, onward)  Start     Dose/Rate Route Frequency Ordered Stop   12/16/20 1000  fluconazole (DIFLUCAN) IVPB 100 mg        100 mg 50 mL/hr over 60 Minutes Intravenous Every 24 hours 12/15/20 1427     12/15/20 1530  fluconazole (DIFLUCAN) IVPB 200 mg        200 mg 100 mL/hr over 60 Minutes Intravenous  Once 12/15/20 1426 12/15/20 1701   12/15/20 0900  cefTRIAXone (ROCEPHIN) 2 g in  sodium chloride 0.9 % 100 mL IVPB        2 g 200 mL/hr over 30 Minutes Intravenous Every 24 hours 12/15/20 0814     12/13/20 2200  vancomycin (VANCOREADY) IVPB 1500 mg/300 mL  Status:  Discontinued        1,500 mg 150 mL/hr over 120 Minutes Intravenous Every 24 hours 12/12/20 2054 12/13/20 1003   12/13/20 2200  vancomycin (VANCOREADY) IVPB 1250 mg/250 mL  Status:  Discontinued        1,250 mg 166.7 mL/hr over 90 Minutes Intravenous Every 24 hours 12/13/20 1003 12/13/20 1256   12/13/20 0800  metroNIDAZOLE (FLAGYL) IVPB 500 mg        500 mg 100 mL/hr over 60 Minutes Intravenous Every 8 hours 12/13/20 0241     12/12/20 2245  metroNIDAZOLE (FLAGYL) IVPB 500 mg        500 mg 100 mL/hr over 60 Minutes Intravenous  Once 12/12/20 2235 12/13/20 0108   12/12/20 2200  ceFEPIme (MAXIPIME) 2 g in sodium chloride 0.9 % 100 mL IVPB  Status:  Discontinued        2 g 200 mL/hr over 30 Minutes Intravenous Every 12 hours 12/12/20 2024 12/12/20 2033   12/12/20 2130  vancomycin (VANCOCIN) IVPB 1000 mg/200 mL premix       "Followed by" Linked Group Details   1,000 mg 200 mL/hr over 60 Minutes Intravenous  Once 12/12/20 2024 12/12/20 2348   12/12/20 2045  ceFEPIme (MAXIPIME) 2 g in sodium chloride 0.9 % 100 mL IVPB  Status:  Discontinued        2 g 200 mL/hr over 30 Minutes Intravenous Every 12 hours 12/12/20 2033 12/15/20 0814   12/12/20 2030  vancomycin (VANCOCIN) IVPB 1000 mg/200 mL premix       "Followed by" Linked Group Details   1,000 mg 200 mL/hr over 60 Minutes Intravenous  Once 12/12/20 2024 12/12/20 2225       Assessment/Plan HTN DM OSA - CPAP Asthma - uses inhaler PRN, not on home O2.  Acute respiratory failure with hypoxia - Currently on 2.9H Acute diastolic CHF - EF 37-16% on ECHO 12/13/20 CKD stage III - baseline Cr 1~.48 Wheelchair bound x1 year post-covid-19 infection Obesity BMI 38.42 Hx PE on eliquis (last dose 3/31) UTI - yeast on Ucx, started diflucan Bacteremia - klebsiella  pneumoniae  Cholangitis  - s/p successful ERCP 4/2 with sphincterotomy and removal of 3 stones. GI recommends liver biopsy at the time of cholecystectomy to rule out underlying cirrhosis - LFTs are trending down  ID - diflucan 4/3>>, rocephin 4/3>>, flagyl 3/31>>, vancomycin 3/31>>4/1, cefepime 3/31 x1 FEN - IVF, FLD VTE - SCDs, hold eliquis/ ok for IV heparin if needed Foley - none Follow up - TBD  Plan - Patient ate breakfast this morning. Overall he seems to be improving from cholangitis after ERCP with WBC WNL, he is afebrile, LFTs are downtrending. He is on IV antibiotics for bacteremia and IV diflucan for yeast on Ucx.  He remains on 2.5L [place] (not on home O2).  Would benefit from cholecystectomy when medically optimized, will discuss timing with MD.    LOS: 3 days    Wellington Hampshire, [facility] 12/16/2020, 8:48 AM Please see Amion for pager number during day hours 7:00am-4:30pm

## 2020-12-16 NOTE — Care Management Important Message (Signed)
Important Message  Patient Details IM Letter given to the Patient. Name: Randy Liu MRN: [id number] Date of Birth: 1951-03-25   Medicare Important Message Given:  Yes     Caren Macadam 12/16/2020, 11:45 AM

## 2020-12-16 NOTE — Progress Notes (Addendum)
Randy Liu   Randy Liu TDD:[id number] DOB: Apr Liu, Randy DOA: 12/12/2020  PCP: Burman Freestone, MD  Brief History/Interval Summary: 70 y.o.WM PMHx Asthma, insulin-dependent type 2 diabetes, DM neuropathy, HTN, HLD,CKD stage III followed by nephrology, OSA on CPAP, history of PE on Eliquis, GERD, IBS.  Presented with abdominal pain nausea and vomiting.  Found to have abnormal LFTs.  He was also noted to be septic.  Concern was for cholangitis.  Patient was hospitalized.  Seen by gastroenterology.  Underwent ERCP.  General surgery is also following.   Consultants: Gastroenterology.  General surgery  Procedures:   4/1 echocardiogram;Left Ventricle: LVEF= 60 to 65%.  -Grade II diastolic dysfunction (pseudonormalization).  4/2 ERCP;Choledocholithiasis s/p biliary sphincterotomy  with balloon extraction. - Ascending cholangitis - Periampullary diverticula. - Cholelithiasis with patent cystic duct. - Biopsy was performed   Antibiotics: Anti-infectives (From admission, onward)   Start     Dose/Rate Route Frequency Ordered Stop   12/16/20 1000  fluconazole (DIFLUCAN) IVPB 100 mg        100 mg 50 mL/hr over 60 Minutes Intravenous Every 24 hours 12/15/20 1427     12/15/20 1530  fluconazole (DIFLUCAN) IVPB 200 mg        200 mg 100 mL/hr over 60 Minutes Intravenous  Once 12/15/20 1426 12/15/20 1701   12/15/20 0900  cefTRIAXone (ROCEPHIN) 2 g in sodium chloride 0.9 % 100 mL IVPB        2 g 200 mL/hr over 30 Minutes Intravenous Every 24 hours 12/15/20 0814     12/13/20 2200  vancomycin (VANCOREADY) IVPB 1500 mg/300 mL  Status:  Discontinued        1,500 mg 150 mL/hr over 120 Minutes Intravenous Every 24 hours 12/12/20 2054 12/13/20 1003   12/13/20 2200  vancomycin (VANCOREADY) IVPB 1250 mg/250 mL  Status:  Discontinued         1,250 mg 166.7 mL/hr over 90 Minutes Intravenous Every 24 hours 12/13/20 1003 12/13/20 1256   12/13/20 0800  metroNIDAZOLE (FLAGYL) IVPB 500 mg        500 mg 100 mL/hr over 60 Minutes Intravenous Every 8 hours 12/13/20 0241     12/12/20 2245  metroNIDAZOLE (FLAGYL) IVPB 500 mg        500 mg 100 mL/hr over 60 Minutes Intravenous  Once 12/12/20 2235 12/13/20 0108   12/12/20 2200  ceFEPIme (MAXIPIME) 2 g in sodium chloride 0.9 % 100 mL IVPB  Status:  Discontinued        2 g 200 mL/hr over 30 Minutes Intravenous Every 12 hours 12/12/20 2024 12/12/20 2033   12/12/20 2130  vancomycin (VANCOCIN) IVPB 1000 mg/200 mL premix       "Followed by" Linked Group Details   1,000 mg 200 mL/hr over 60 Minutes Intravenous  Once 12/12/20 2024 12/12/20 2348   12/12/20 2045  ceFEPIme (MAXIPIME) 2 g in sodium chloride 0.9 % 100 mL IVPB  Status:  Discontinued        2 g 200 mL/hr over 30 Minutes Intravenous Every 12 hours 12/12/20 2033 12/15/20 0814   12/12/20 2030  vancomycin (VANCOCIN) IVPB 1000 mg/200 mL premix       "Followed by" Linked Group Details   1,000 mg 200 mL/hr over 60 Minutes Intravenous  Once 12/12/20 2024 12/12/20 2225      Subjective/Interval History: Patient denies any abdominal pain.  Denies any nausea or vomiting this morning.  No chest pain or shortness of breath.    Assessment/Plan:  Acute cholangitis/severe sepsis Patient met criteria for severe sepsis on admission.  LFTs were noted to be abnormal.  Patient underwent CT abdomen followed by MRCP.  Also underwent ultrasound of the abdomen.  Gastroenterology and general surgery was consulted.  Patient underwent ERCP on 4/2.  Choledocholithiasis was noted.  Patient underwent sphincterotomy and balloon extraction. Currently patient remains on ceftriaxone and metronidazole. LFTs remain abnormal.  Hopefully will improve in the next 24 to 48 hours.  Cholelithiasis General surgery is following.  Patient to undergo cholecystectomy at some  point.  Timing to be decided by general surgery.  Klebsiella bacteremia Most likely secondary to above.  Sensitivities noted patient on ceftriaxone and azithromycin currently.  History of PE Patient on Eliquis.  On hold in case patient needs to undergo surgery.  Lower extremity Doppler studies negative for DVT.  Acute respiratory failure with hypoxia Does not usually use oxygen at home.  Apparently has been requiring 4 L here in the hospital.  He is morbidly obese.  Could have obesity hypoventilation syndrome.   Noted to be off of oxygen this morning.   Chest x-ray did not show any acute findings.  Continue to monitor.  History of asthma Stable.  No wheezing heard.  Chronic diastolic CHF Echocardiogram showed diastolic dysfunction.  He does have a pedal edema.  Otherwise does not have any respiratory issues currently.  No pulmonary edema noted on chest x-ray.  Not noted to be on any diuretics.  Hyponatremia/hypokalemia/hypophosphatemia Sodium level stable.  Replace potassium and phosphorus.  Diabetes mellitus type 2 uncontrolled with hyperglycemia HbA1c 7.9.  Continue SSI.  Essential hypertension Monitor blood pressures closely.  Noted to be on amlodipine hydralazine.  Acute kidney injury on chronic kidney disease stage IIIa Creatinine peaked at 1.81.  Improved and seems to be back to baseline.  Monitor urine output.  UTI with yeast 40,000 colonies noted on culture.  Patient was started on fluconazole.  Obstructive sleep apnea CPAP  QT prolongation Avoid QT prolonging medications.  Physical deconditioning Patient mentions that he does not ambulate much at home due to history of peripheral neuropathy.  Obesity Estimated body mass index is 38.42 kg/m as calculated from the following:   Height as of this encounter: 6' 1"  (1.854 m).   Weight as of this encounter: 132.1 kg.    DVT Prophylaxis: SCDs Code Status: Full code Family Communication: Discussed with the  patient Disposition Plan: Hopefully return home when improved.  PT and OT evaluation.  Waiting on surgery input as well.  Status is: Inpatient  Remains inpatient appropriate because:Ongoing active pain requiring inpatient pain management, IV treatments appropriate due to intensity of illness or inability to take PO and Inpatient level of care appropriate due to severity of illness   Dispo: The patient is from: Home              Anticipated d/c is to: Home              Patient currently is not medically stable to d/c.   Difficult to place patient No      Medications:  Scheduled: . amLODipine  10 mg Oral Daily  . chlorhexidine  15 mL Mouth Rinse BID  . Chlorhexidine Gluconate Cloth  6 each Topical Daily  . hydrALAZINE  Liu mg Oral Q6H  . insulin aspart  0-15 Units Subcutaneous Q4H  . ipratropium-albuterol  3 mL Nebulization BID  . latanoprost  1 drop Left Eye QHS  . mouth rinse  15 mL Mouth  Rinse q12n4p   Continuous: . sodium chloride 100 mL/hr at 12/16/20 0402  . cefTRIAXone (ROCEPHIN)  IV Stopped (12/15/20 1150)  . fluconazole (DIFLUCAN) IV    . metronidazole 500 mg (12/16/20 0810)   LTJ:[id number] **OR** acetaminophen, albuterol, amisulpride, fentaNYL (SUBLIMAZE) injection, metoprolol tartrate, ondansetron (ZOFRAN) IV, prochlorperazine   Objective:  Vital Signs  Vitals:   12/16/20 0114 12/16/20 0500 12/16/20 0519 12/16/20 0819  BP:   134/69   Pulse: 87  76   Resp: 18  20   Temp:   98.3 F (36.8 C)   TempSrc:   Oral   SpO2: 94%  95% 95%  Weight:  132.1 kg    Height:        Intake/Output Summary (Last 24 hours) at 12/16/2020 0926 Last data filed at 12/16/2020 0526 Gross per 24 hour  Intake 3180.8 ml  Output 1500 ml  Net 1680.8 ml   Filed Weights   12/14/20 0500 12/15/20 0427 12/16/20 0500  Weight: 126.2 kg 130.6 kg 132.1 kg    General appearance: Awake alert.  In no distress Resp: Clear to auscultation bilaterally.  Normal effort Cardio: S1-S2 is  normal regular.  No S3-S4.  No rubs murmurs or bruit GI: Abdomen is soft.  Nontender nondistended.  Bowel sounds are present normal.  No masses organomegaly Extremities: Edema noted bilateral lower extremity Neurologic: No obvious focal neurological deficits.  Weakness in both lower extremities is chronic from his neuropathy.   Lab Results:  Data Reviewed: I have personally reviewed following labs and imaging studies  CBC: Recent Labs  Lab 12/12/20 1940 12/13/20 0258 12/14/20 0603 12/15/20 0233 12/16/20 0349  WBC 6.5 12.6* 6.1 7.7 7.3  NEUTROABS 5.7  --  4.6 5.3 4.8  HGB 14.9 13.3 12.4* 12.2* 12.0*  HCT 46.3 42.1 38.8* 39.3 37.6*  MCV 97.5 100.2* 99.5 101.8* 98.9  PLT 198 177 138* 137* 263    Basic Metabolic Panel: Recent Labs  Lab 12/12/20 1940 12/13/20 0258 12/14/20 0603 12/15/20 0233 12/16/20 0349  NA 131* 132* 135 132* 132*  K 4.6 5.1 3.9 4.9 3.3*  CL 97* 99 103 101 102  CO2 22 19* 19* 19* 21*  GLUCOSE 278* 298* 150* 155* 149*  BUN 27* 30* 27* 24* 15  CREATININE 1.57* 1.81* 1.34* 1.08 0.77  CALCIUM 9.0 8.8* 8.9 8.8* 8.8*  MG  --  2.0 2.0 2.0 1.7  PHOS  --   --  2.1* 1.9* 1.2*    GFR: Estimated Creatinine Clearance: 120.8 mL/min (by C-G formula based on SCr of 0.77 mg/dL).  Liver Function Tests: Recent Labs  Lab 12/12/20 1940 12/13/20 0258 12/14/20 0603 12/15/20 0233 12/16/20 0349  AST 188* 121* 132* 182* 130*  ALT 199* 163* 127* 137* 121*  ALKPHOS 74 76 66 90 107  BILITOT 4.1* 5.7* 5.0* 6.1* 4.8*  PROT 6.8 5.9* 5.9* 5.8* 5.6*  ALBUMIN 2.9* 2.7* 2.9* 2.7* 2.3*    Recent Labs  Lab 12/12/20 1940  LIPASE Liu    Coagulation Profile: Recent Labs  Lab 12/12/20 1940 12/13/20 0258  INR 1.2 1.4*     CBG: Recent Labs  Lab 12/15/20 1616 12/15/20 2008 12/16/20 0012 12/16/20 0406 12/16/20 0731  GLUCAP 185* 205* 158* 153* 150*    Anemia Panel: Recent Labs    12/13/20 1031  FERRITIN 203  TIBC 254  IRON 18*    Recent Results (from  the past 240 hour(s))  Culture, blood (Routine x 2)     Status: Abnormal  Collection Time: 12/12/20  7:45 PM   Specimen: BLOOD  Result Value Ref Range Status   Specimen Description   Final    BLOOD RIGHT ANTECUBITAL Performed at [facility], [address] [place], [place] [address]    Special Requests   Final    BOTTLES DRAWN AEROBIC AND ANAEROBIC Blood Culture adequate volume Performed at [facility], [address] [place], [place] [address]    Culture  Setup Time   Final    GRAM NEGATIVE RODS IN BOTH AEROBIC AND ANAEROBIC BOTTLES CRITICAL RESULT CALLED TO, READ BACK BY AND VERIFIED WITHGuadlupe Spanish @ 0973 12/13/20 EB Performed at [facility], [place] [address], [place], [place] [address]    Culture KLEBSIELLA PNEUMONIAE (A)  Final   Report Status 12/15/2020 FINAL  Final   Organism ID, Bacteria KLEBSIELLA PNEUMONIAE  Final      Susceptibility   Klebsiella pneumoniae - MIC*    AMPICILLIN >=32 RESISTANT Resistant     CEFAZOLIN <=4 SENSITIVE Sensitive     CEFEPIME <=0.12 SENSITIVE Sensitive     CEFTAZIDIME <=1 SENSITIVE Sensitive     CEFTRIAXONE <=0.Liu SENSITIVE Sensitive     CIPROFLOXACIN <=0.Liu SENSITIVE Sensitive     GENTAMICIN <=1 SENSITIVE Sensitive     IMIPENEM <=0.Liu SENSITIVE Sensitive     TRIMETH/SULFA <=20 SENSITIVE Sensitive     AMPICILLIN/SULBACTAM 8 SENSITIVE Sensitive     PIP/TAZO <=4 SENSITIVE Sensitive     * KLEBSIELLA PNEUMONIAE  Blood Culture ID Panel (Reflexed)     Status: Abnormal   Collection Time: 12/12/20  7:45 PM  Result Value Ref Range Status   Enterococcus faecalis NOT DETECTED NOT DETECTED Final   Enterococcus Faecium NOT DETECTED NOT DETECTED Final   Listeria monocytogenes NOT DETECTED NOT DETECTED Final   Staphylococcus species NOT DETECTED NOT DETECTED Final   Staphylococcus aureus (BCID) NOT DETECTED NOT DETECTED Final   Staphylococcus epidermidis NOT DETECTED NOT DETECTED Final   Staphylococcus lugdunensis NOT  DETECTED NOT DETECTED Final   Streptococcus species NOT DETECTED NOT DETECTED Final   Streptococcus agalactiae NOT DETECTED NOT DETECTED Final   Streptococcus pneumoniae NOT DETECTED NOT DETECTED Final   Streptococcus pyogenes NOT DETECTED NOT DETECTED Final   A.calcoaceticus-baumannii NOT DETECTED NOT DETECTED Final   Bacteroides fragilis NOT DETECTED NOT DETECTED Final   Enterobacterales DETECTED (A) NOT DETECTED Final    Comment: Enterobacterales represent a large order of gram negative bacteria, not a single organism. CRITICAL RESULT CALLED TO, READ BACK BY AND VERIFIED WITH: N GLOGOVAC @ 2426 12/13/20 EB    Enterobacter cloacae complex NOT DETECTED NOT DETECTED Final   Escherichia coli NOT DETECTED NOT DETECTED Final   Klebsiella aerogenes NOT DETECTED NOT DETECTED Final   Klebsiella oxytoca NOT DETECTED NOT DETECTED Final   Klebsiella pneumoniae DETECTED (A) NOT DETECTED Final    Comment: CRITICAL RESULT CALLED TO, READ BACK BY AND VERIFIED WITH: N GLOGOVAC @ 1153 12/13/20 EB    Proteus species NOT DETECTED NOT DETECTED Final   Salmonella species NOT DETECTED NOT DETECTED Final   Serratia marcescens NOT DETECTED NOT DETECTED Final   Haemophilus influenzae NOT DETECTED NOT DETECTED Final   Neisseria meningitidis NOT DETECTED NOT DETECTED Final   Pseudomonas aeruginosa NOT DETECTED NOT DETECTED Final   Stenotrophomonas maltophilia NOT DETECTED NOT DETECTED Final   Candida albicans NOT DETECTED NOT DETECTED Final   Candida auris NOT DETECTED NOT DETECTED Final   Candida glabrata NOT DETECTED  NOT DETECTED Final   Candida krusei NOT DETECTED NOT DETECTED Final   Candida parapsilosis NOT DETECTED NOT DETECTED Final   Candida tropicalis NOT DETECTED NOT DETECTED Final   Cryptococcus neoformans/gattii NOT DETECTED NOT DETECTED Final   CTX-M ESBL NOT DETECTED NOT DETECTED Final   Carbapenem resistance IMP NOT DETECTED NOT DETECTED Final   Carbapenem resistance KPC NOT DETECTED NOT  DETECTED Final   Carbapenem resistance NDM NOT DETECTED NOT DETECTED Final   Carbapenem resist OXA 48 LIKE NOT DETECTED NOT DETECTED Final   Carbapenem resistance VIM NOT DETECTED NOT DETECTED Final    Comment: Performed at [facility], [place] [address] [place], [place] [address]  Resp Panel by RT-PCR (Flu A&B, Covid) Nasopharyngeal Swab     Status: None   Collection Time: 12/12/20  8:15 PM   Specimen: Nasopharyngeal Swab; Nasopharyngeal(NP) swabs in vial transport medium  Result Value Ref Range Status   SARS Coronavirus 2 by RT PCR NEGATIVE NEGATIVE Final    Comment: (Liu) SARS-CoV-2 target nucleic acids are NOT DETECTED.  The SARS-CoV-2 RNA is generally detectable in upper respiratory specimens during the acute phase of infection. The lowest concentration of SARS-CoV-2 viral copies this assay can detect is 138 copies/mL. A negative result does not preclude SARS-Cov-2 infection and should not be used as the sole basis for treatment or other patient management decisions. A negative result may occur with  improper specimen collection/handling, submission of specimen other than nasopharyngeal swab, presence of viral mutation(s) within the areas targeted by this assay, and inadequate number of viral copies(<138 copies/mL). A negative result must be combined with clinical observations, patient history, and epidemiological information. The expected result is Negative.  Fact Sheet for Patients:  EntrepreneurPulse.com.au  Fact Sheet for Healthcare Providers:  IncredibleEmployment.be  This test is no t yet approved or cleared by the [place] FDA and  has been authorized for detection and/or diagnosis of SARS-CoV-2 by FDA under an Emergency Use Authorization (EUA). This EUA will remain  in effect (meaning this test can be used) for the duration of the COVID-19 declaration under Section 564(b)(1) of the Act, 21 U.S.C.section 360bbb-3(b)(1),  unless the authorization is terminated  or revoked sooner.       Influenza A by PCR NEGATIVE NEGATIVE Final   Influenza B by PCR NEGATIVE NEGATIVE Final    Comment: (Liu) The Xpert Xpress SARS-CoV-2/FLU/RSV plus assay is intended as an aid in the diagnosis of influenza from Nasopharyngeal swab specimens and should not be used as a sole basis for treatment. Nasal washings and aspirates are unacceptable for Xpert Xpress SARS-CoV-2/FLU/RSV testing.  Fact Sheet for Patients: EntrepreneurPulse.com.au  Fact Sheet for Healthcare Providers: IncredibleEmployment.be  This test is not yet approved or cleared by the [place] FDA and has been authorized for detection and/or diagnosis of SARS-CoV-2 by FDA under an Emergency Use Authorization (EUA). This EUA will remain in effect (meaning this test can be used) for the duration of the COVID-19 declaration under Section 564(b)(1) of the Act, 21 U.S.C. section 360bbb-3(b)(1), unless the authorization is terminated or revoked.  Performed at [facility], [address] [place], [place] [address]   Culture, blood (Routine x 2)     Status: Abnormal   Collection Time: 12/12/20  9:20 PM   Specimen: BLOOD  Result Value Ref Range Status   Specimen Description   Final    BLOOD Blood Culture adequate volume Performed at [facility], [address],  [place], [place] [address]    Special Requests   Final    BOTTLES DRAWN AEROBIC AND ANAEROBIC LEFT ANTECUBITAL Performed at [facility], [address] [place], [place] [address]    Culture  Setup Time   Final    GRAM NEGATIVE RODS IN BOTH AEROBIC AND ANAEROBIC BOTTLES CRITICAL VALUE NOTED.  VALUE IS CONSISTENT WITH PREVIOUSLY REPORTED AND CALLED VALUE.    Culture (A)  Final    KLEBSIELLA PNEUMONIAE SUSCEPTIBILITIES PERFORMED ON PREVIOUS CULTURE WITHIN THE LAST 5 DAYS. Performed at [facility], [place] [address], [place], [place] [address]    Report Status 12/15/2020 FINAL  Final  MRSA PCR Screening     Status: None   Collection Time: 12/13/20  1:29 AM   Specimen: Nasopharyngeal  Result Value Ref Range Status   MRSA by PCR NEGATIVE NEGATIVE Final    Comment:        The GeneXpert MRSA Assay (FDA approved for NASAL specimens only), is one component of a comprehensive MRSA colonization surveillance program. It is not intended to diagnose MRSA infection nor to guide or monitor treatment for MRSA infections. Performed at [facility], [place] [address] [place], [place] [address]   Urine Culture     Status: Abnormal   Collection Time: 12/14/20 12:39 AM   Specimen: Urine, Random  Result Value Ref Range Status   Specimen Description   Final    URINE, RANDOM Performed at [facility] [address] [place], [place] [address]    Special Requests   Final    NONE Performed at [facility], [place] [address] [place], [place] [address]    Culture 40,000 COLONIES/mL YEAST (A)  Final   Report Status 12/15/2020 FINAL  Final      Radiology Studies: DG ERCP BILIARY & PANCREATIC DUCTS  Result Date: 12/14/2020 CLINICAL DATA:  ERCP with sphincterotomy. EXAM: ERCP TECHNIQUE: Multiple spot images obtained with the fluoroscopic device and submitted for interpretation post-procedure. FLUOROSCOPY TIME:  2 minutes, 44 seconds (86.3 mGy) COMPARISON:  MRCP-12/13/2020 FINDINGS: Eight spot intraoperative fluoroscopic images the right upper abdominal quadrant during ERCP are provided for review Initial image demonstrates an ERCP probe overlying the right upper abdominal quadrant Subsequent images demonstrate selective cannulation and opacification of the common bile duct which appears nondilated. Subsequent images demonstrate insufflation of a balloon within the central aspect of the CBD with subsequent sweeping and presumed sphincterotomy. There is opacification of  the cystic duct with passage of contrast to the level of the gallbladder lumen. There is minimal opacification intrahepatic biliary tree which appears nondilated. There is no definitive opacification of the pancreatic duct. IMPRESSION: ERCP with biliary sweeping and presumed sphincterotomy as above. These images were submitted for radiologic interpretation only. Please see the procedural report for the amount of contrast and the fluoroscopy time utilized. Electronically Signed   By: Sandi Mariscal M.D.   On: 12/14/2020 10:18       LOS: 3 days   [facility] Pager on [email or web address]  12/16/2020, 9:26 AM

## 2020-12-17 ENCOUNTER — Inpatient Hospital Stay (HOSPITAL_COMMUNITY): Payer: Medicare Other | Admitting: Certified Registered Nurse Anesthetist

## 2020-12-17 ENCOUNTER — Other Ambulatory Visit: Payer: Self-pay

## 2020-12-17 ENCOUNTER — Encounter (HOSPITAL_COMMUNITY)
Admission: EM | Disposition: A | Discharge status: Skilled Nursing Facility | Payer: Self-pay | Source: Home / Self Care | Attending: Internal Medicine

## 2020-12-17 DIAGNOSIS — K8309 Other cholangitis: Secondary | ICD-10-CM | POA: Diagnosis not present

## 2020-12-17 DIAGNOSIS — E871 Hypo-osmolality and hyponatremia: Secondary | ICD-10-CM | POA: Diagnosis not present

## 2020-12-17 DIAGNOSIS — J9601 Acute respiratory failure with hypoxia: Secondary | ICD-10-CM | POA: Diagnosis not present

## 2020-12-17 DIAGNOSIS — E1140 Type 2 diabetes mellitus with diabetic neuropathy, unspecified: Secondary | ICD-10-CM | POA: Diagnosis not present

## 2020-12-17 HISTORY — PX: CHOLECYSTECTOMY: SHX55

## 2020-12-17 LAB — CBC WITH DIFFERENTIAL/PLATELET
Abs Immature Granulocytes: 0.57 10*3/uL — ABNORMAL HIGH (ref 0.00–0.07)
Basophils Absolute: 0.1 10*3/uL (ref 0.0–0.1)
Basophils Relative: 1 %
Eosinophils Absolute: 0.2 10*3/uL (ref 0.0–0.5)
Eosinophils Relative: 2 %
HCT: 38.5 % — ABNORMAL LOW (ref 39.0–52.0)
Hemoglobin: 12.2 g/dL — ABNORMAL LOW (ref 13.0–17.0)
Immature Granulocytes: 8 %
Lymphocytes Relative: 19 %
Lymphs Abs: 1.5 10*3/uL (ref 0.7–4.0)
MCH: 31.4 pg (ref 26.0–34.0)
MCHC: 31.7 g/dL (ref 30.0–36.0)
MCV: 99.2 fL (ref 80.0–100.0)
Monocytes Absolute: 0.8 10*3/uL (ref 0.1–1.0)
Monocytes Relative: 10 %
Neutro Abs: 4.5 10*3/uL (ref 1.7–7.7)
Neutrophils Relative %: 60 %
Platelets: 191 10*3/uL (ref 150–400)
RBC: 3.88 MIL/uL — ABNORMAL LOW (ref 4.22–5.81)
RDW: 16.1 % — ABNORMAL HIGH (ref 11.5–15.5)
WBC: 7.5 10*3/uL (ref 4.0–10.5)
nRBC: 0 % (ref 0.0–0.2)

## 2020-12-17 LAB — COMPREHENSIVE METABOLIC PANEL
ALT: 95 U/L — ABNORMAL HIGH (ref 0–44)
AST: 78 U/L — ABNORMAL HIGH (ref 15–41)
Albumin: 2.4 g/dL — ABNORMAL LOW (ref 3.5–5.0)
Alkaline Phosphatase: 124 U/L (ref 38–126)
Anion gap: 12 (ref 5–15)
BUN: 11 mg/dL (ref 8–23)
CO2: 20 mmol/L — ABNORMAL LOW (ref 22–32)
Calcium: 9.2 mg/dL (ref 8.9–10.3)
Chloride: 103 mmol/L (ref 98–111)
Creatinine, Ser: 0.84 mg/dL (ref 0.61–1.24)
GFR, Estimated: >60 mL/min (ref >60)
Glucose, Bld: 183 mg/dL — ABNORMAL HIGH (ref 70–99)
Potassium: 3.8 mmol/L (ref 3.5–5.1)
Sodium: 135 mmol/L (ref 135–145)
Total Bilirubin: 3.4 mg/dL — ABNORMAL HIGH (ref 0.3–1.2)
Total Protein: 5.9 g/dL — ABNORMAL LOW (ref 6.5–8.1)

## 2020-12-17 LAB — GLUCOSE, CAPILLARY
Glucose-Capillary: 182 mg/dL — ABNORMAL HIGH (ref 70–99)
Glucose-Capillary: 202 mg/dL — ABNORMAL HIGH (ref 70–99)
Glucose-Capillary: 235 mg/dL — ABNORMAL HIGH (ref 70–99)
Glucose-Capillary: 300 mg/dL — ABNORMAL HIGH (ref 70–99)

## 2020-12-17 LAB — PHOSPHORUS: Phosphorus: 2 mg/dL — ABNORMAL LOW (ref 2.5–4.6)

## 2020-12-17 LAB — SURGICAL PATHOLOGY

## 2020-12-17 LAB — MAGNESIUM: Magnesium: 2 mg/dL (ref 1.7–2.4)

## 2020-12-17 SURGERY — LAPAROSCOPIC CHOLECYSTECTOMY
Anesthesia: General | Site: Abdomen

## 2020-12-17 MED ORDER — OXYCODONE HCL 5 MG/5ML PO SOLN
5.0000 mg | Freq: Once | ORAL | Status: DC | PRN
Start: 2020-12-17 — End: 2020-12-17

## 2020-12-17 MED ORDER — BUPIVACAINE-EPINEPHRINE (PF) 0.25% -1:200000 IJ SOLN
INTRAMUSCULAR | Status: DC | PRN
  Administered 2020-12-17: 30 mL

## 2020-12-17 MED ORDER — ROCURONIUM BROMIDE 10 MG/ML (PF) SYRINGE
PREFILLED_SYRINGE | INTRAVENOUS | Status: AC
  Filled 2020-12-17: qty 10

## 2020-12-17 MED ORDER — MORPHINE SULFATE (PF) 2 MG/ML IV SOLN: 2.0000 mg | INTRAVENOUS | Status: DC | PRN

## 2020-12-17 MED ORDER — LACTATED RINGERS IR SOLN
Status: DC | PRN
  Administered 2020-12-17: 1000 mL

## 2020-12-17 MED ORDER — METHOCARBAMOL 1000 MG/10ML IJ SOLN
500.0000 mg | Freq: Four times a day (QID) | INTRAVENOUS | Status: DC | PRN
  Filled 2020-12-17: qty 5

## 2020-12-17 MED ORDER — PROPOFOL 10 MG/ML IV BOLUS
INTRAVENOUS | Status: DC | PRN
  Administered 2020-12-17: 140 mg via INTRAVENOUS

## 2020-12-17 MED ORDER — POTASSIUM PHOSPHATES 15 MMOLE/5ML IV SOLN
9.0000 mmol | Freq: Once | INTRAVENOUS | Status: AC
  Administered 2020-12-17: 9 mmol via INTRAVENOUS
  Filled 2020-12-17: qty 3

## 2020-12-17 MED ORDER — MEPERIDINE HCL 50 MG/ML IJ SOLN: 6.2500 mg | INTRAMUSCULAR | Status: DC | PRN

## 2020-12-17 MED ORDER — ONDANSETRON HCL 4 MG/2ML IJ SOLN
INTRAMUSCULAR | Status: AC
  Filled 2020-12-17: qty 2

## 2020-12-17 MED ORDER — 0.9 % SODIUM CHLORIDE (POUR BTL) OPTIME
TOPICAL | Status: DC | PRN
  Administered 2020-12-17: 1000 mL

## 2020-12-17 MED ORDER — LIDOCAINE 2% (20 MG/ML) 5 ML SYRINGE
INTRAMUSCULAR | Status: DC | PRN
  Administered 2020-12-17: 100 mg via INTRAVENOUS

## 2020-12-17 MED ORDER — INSULIN ASPART 100 UNIT/ML ~~LOC~~ SOLN
SUBCUTANEOUS | Status: AC
  Filled 2020-12-17: qty 1

## 2020-12-17 MED ORDER — LABETALOL HCL 5 MG/ML IV SOLN
INTRAVENOUS | Status: DC | PRN
  Administered 2020-12-17: 2.5 mg via INTRAVENOUS

## 2020-12-17 MED ORDER — BUPIVACAINE-EPINEPHRINE (PF) 0.25% -1:200000 IJ SOLN
INTRAMUSCULAR | Status: AC
  Filled 2020-12-17: qty 30

## 2020-12-17 MED ORDER — LIDOCAINE 2% (20 MG/ML) 5 ML SYRINGE
INTRAMUSCULAR | Status: AC
  Filled 2020-12-17: qty 5

## 2020-12-17 MED ORDER — FENTANYL CITRATE (PF) 100 MCG/2ML IJ SOLN
INTRAMUSCULAR | Status: AC
  Filled 2020-12-17: qty 2

## 2020-12-17 MED ORDER — PHENYLEPHRINE 40 MCG/ML (10ML) SYRINGE FOR IV PUSH (FOR BLOOD PRESSURE SUPPORT)
PREFILLED_SYRINGE | INTRAVENOUS | Status: DC | PRN
  Administered 2020-12-17: 200 ug via INTRAVENOUS

## 2020-12-17 MED ORDER — AMISULPRIDE (ANTIEMETIC) 5 MG/2ML IV SOLN
10.0000 mg | Freq: Once | INTRAVENOUS | Status: AC | PRN
  Administered 2020-12-17: 10 mg via INTRAVENOUS

## 2020-12-17 MED ORDER — AMISULPRIDE (ANTIEMETIC) 5 MG/2ML IV SOLN
INTRAVENOUS | Status: AC
  Filled 2020-12-17: qty 4

## 2020-12-17 MED ORDER — FENTANYL CITRATE (PF) 250 MCG/5ML IJ SOLN
INTRAMUSCULAR | Status: DC | PRN
  Administered 2020-12-17: 100 ug via INTRAVENOUS
  Administered 2020-12-17 (×2): 50 ug via INTRAVENOUS

## 2020-12-17 MED ORDER — ROCURONIUM BROMIDE 10 MG/ML (PF) SYRINGE
PREFILLED_SYRINGE | INTRAVENOUS | Status: DC | PRN
  Administered 2020-12-17: 40 mg via INTRAVENOUS
  Administered 2020-12-17: 60 mg via INTRAVENOUS

## 2020-12-17 MED ORDER — ONDANSETRON HCL 4 MG/2ML IJ SOLN: 4.0000 mg | Freq: Once | INTRAMUSCULAR | Status: DC | PRN

## 2020-12-17 MED ORDER — ONDANSETRON HCL 4 MG/2ML IJ SOLN
INTRAMUSCULAR | Status: DC | PRN
  Administered 2020-12-17: 4 mg via INTRAVENOUS

## 2020-12-17 MED ORDER — OXYCODONE HCL 5 MG PO TABS: 5.0000 mg | ORAL_TABLET | Freq: Once | ORAL | Status: DC | PRN

## 2020-12-17 MED ORDER — CHLORHEXIDINE GLUCONATE CLOTH 2 % EX PADS: 6.0000 | MEDICATED_PAD | Freq: Once | CUTANEOUS | Status: DC

## 2020-12-17 MED ORDER — SUGAMMADEX SODIUM 200 MG/2ML IV SOLN
INTRAVENOUS | Status: DC | PRN
  Administered 2020-12-17: 500 mg via INTRAVENOUS

## 2020-12-17 MED ORDER — CHLORHEXIDINE GLUCONATE CLOTH 2 % EX PADS
6.0000 | MEDICATED_PAD | Freq: Every day | CUTANEOUS | Status: DC
  Administered 2020-12-17 – 2020-12-18 (×2): 6 via TOPICAL

## 2020-12-17 MED ORDER — TRAMADOL HCL 50 MG PO TABS: 50.0000 mg | ORAL_TABLET | Freq: Four times a day (QID) | ORAL | Status: DC | PRN

## 2020-12-17 MED ORDER — LACTATED RINGERS IV SOLN: INTRAVENOUS | Status: DC

## 2020-12-17 MED ORDER — CHLORHEXIDINE GLUCONATE CLOTH 2 % EX PADS
6.0000 | MEDICATED_PAD | Freq: Once | CUTANEOUS | Status: AC
  Administered 2020-12-17: 6 via TOPICAL

## 2020-12-17 MED ORDER — HYDROMORPHONE HCL 1 MG/ML IJ SOLN: 0.2500 mg | INTRAMUSCULAR | Status: DC | PRN

## 2020-12-17 MED ORDER — DEXAMETHASONE SODIUM PHOSPHATE 10 MG/ML IJ SOLN
INTRAMUSCULAR | Status: DC | PRN
  Administered 2020-12-17: 8 mg via INTRAVENOUS

## 2020-12-17 MED ORDER — LABETALOL HCL 5 MG/ML IV SOLN
INTRAVENOUS | Status: AC
  Filled 2020-12-17: qty 8

## 2020-12-17 MED ORDER — PROPOFOL 10 MG/ML IV BOLUS
INTRAVENOUS | Status: AC
  Filled 2020-12-17: qty 20

## 2020-12-17 MED ORDER — ACETAMINOPHEN 10 MG/ML IV SOLN: 1000.0000 mg | Freq: Once | INTRAVENOUS | Status: DC | PRN

## 2020-12-17 MED ORDER — OXYCODONE HCL 5 MG PO TABS: 5.0000 mg | ORAL_TABLET | ORAL | Status: DC | PRN

## 2020-12-17 MED ORDER — DEXAMETHASONE SODIUM PHOSPHATE 10 MG/ML IJ SOLN
INTRAMUSCULAR | Status: AC
  Filled 2020-12-17: qty 1

## 2020-12-17 SURGICAL SUPPLY — 38 items
ADH SKN CLS APL DERMABOND .7 (GAUZE/BANDAGES/DRESSINGS) ×1
APL PRP STRL LF DISP 70% ISPRP (MISCELLANEOUS) ×1
APPLIER CLIP ROT 10 11.4 M/L (STAPLE) ×2
APR CLP MED LRG 11.4X10 (STAPLE) ×1
BAG SPEC RTRVL LRG 6X4 10 (ENDOMECHANICALS) ×1
CABLE HIGH FREQUENCY MONO STRZ (ELECTRODE) ×2 IMPLANT
CHLORAPREP W/TINT 26 (MISCELLANEOUS) ×2 IMPLANT
CLIP APPLIE ROT 10 11.4 M/L (STAPLE) ×1 IMPLANT
COVER MAYO STAND STRL (DRAPES) IMPLANT
COVER SURGICAL LIGHT HANDLE (MISCELLANEOUS) ×2 IMPLANT
COVER WAND RF STERILE (DRAPES) IMPLANT
DECANTER SPIKE VIAL GLASS SM (MISCELLANEOUS) ×2 IMPLANT
DERMABOND ADVANCED (GAUZE/BANDAGES/DRESSINGS) ×1
DERMABOND ADVANCED .7 DNX12 (GAUZE/BANDAGES/DRESSINGS) IMPLANT
DRAPE C-ARM 42X120 X-RAY (DRAPES) IMPLANT
ELECT REM PT RETURN 15FT ADLT (MISCELLANEOUS) ×2 IMPLANT
GLOVE SURG ORTHO LTX SZ8 (GLOVE) ×2 IMPLANT
GOWN STRL REUS W/TWL XL LVL3 (GOWN DISPOSABLE) ×4 IMPLANT
HEMOSTAT SURGICEL 4X8 (HEMOSTASIS) IMPLANT
KIT BASIN OR (CUSTOM PROCEDURE TRAY) ×2 IMPLANT
KIT TURNOVER KIT A (KITS) ×2 IMPLANT
PAD POSITIONING PINK XL (MISCELLANEOUS) IMPLANT
PENCIL SMOKE EVACUATOR (MISCELLANEOUS) IMPLANT
POUCH SPECIMEN RETRIEVAL 10MM (ENDOMECHANICALS) ×2 IMPLANT
SCISSORS LAP 5X35 DISP (ENDOMECHANICALS) ×2 IMPLANT
SET CHOLANGIOGRAPH MIX (MISCELLANEOUS) ×2 IMPLANT
SET IRRIG TUBING LAPAROSCOPIC (IRRIGATION / IRRIGATOR) ×2 IMPLANT
SET TUBE SMOKE EVAC HIGH FLOW (TUBING) ×2 IMPLANT
SLEEVE XCEL OPT CAN 5 100 (ENDOMECHANICALS) ×2 IMPLANT
STRIP CLOSURE SKIN 1/2X4 (GAUZE/BANDAGES/DRESSINGS) ×2 IMPLANT
SUT VIC AB 4-0 PS2 27 (SUTURE) ×2 IMPLANT
SUT VICRYL 0 UR6 27IN ABS (SUTURE) ×1 IMPLANT
TAPE CLOTH 4X10 WHT NS (GAUZE/BANDAGES/DRESSINGS) IMPLANT
TOWEL OR 17X26 10 PK STRL BLUE (TOWEL DISPOSABLE) ×2 IMPLANT
TRAY LAPAROSCOPIC (CUSTOM PROCEDURE TRAY) ×2 IMPLANT
TROCAR BLADELESS OPT 5 100 (ENDOMECHANICALS) ×2 IMPLANT
TROCAR XCEL BLUNT TIP 100MML (ENDOMECHANICALS) ×2 IMPLANT
TROCAR XCEL NON-BLD 11X100MML (ENDOMECHANICALS) ×2 IMPLANT

## 2020-12-17 NOTE — Plan of Care (Signed)

## 2020-12-17 NOTE — Op Note (Signed)
Operative Note  Pre-operative Diagnosis:  Cholangitis, choledocholithiasis, umbilical hernia, abnormal liver enzymes  Post-operative Diagnosis:  same  Surgeon:  Darnell Level, MD  Assistant:  Carlena Bjornstad, PA-C   Procedure:  Laparoscopic cholecystectomy, laparoscopic liver biopsy, primary repair umbilical hernia  Anesthesia:  general  Estimated Blood Loss:  minimal  Drains: none         Specimen: gallbladder and liver biopsy to pathology  Indications: Patient is a 70 year old male admitted to the medical service with cholangitis.  Patient underwent ERCP with multiple stone extraction.  Patient has abnormal liver enzymes.  He also has an umbilical hernia.  Patient now comes to surgery for cholecystectomy, liver biopsy, and primary repair of umbilical hernia.  Procedure:  The patient was seen in the pre-op holding area. The risks, benefits, complications, treatment options, and expected outcomes were previously discussed with the patient. The patient agreed with the proposed plan and has signed the informed consent form.  The patient was brought to the operating room by the surgical team, identified as Randy Liu and the procedure verified. A "time out" was completed and the above information confirmed.  Following administration of general anesthesia the patient is positioned and then prepped and draped in the usual aseptic fashion.  After ascertaining that an adequate level of anesthesia been achieved, the midline incision is made just above the umbilicus.  Dissection was carried into the subcutaneous tissues.  Hernia sac is opened and there is incarcerated omentum.  This is reduced.  Hernia sac is excised using the electrocautery for hemostasis.  A 0 Vicryl pursestring suture was placed in the fascia.  An Hassan cannula is introduced under direct vision and secured with the pursestring suture.  Abdomen is insufflated with carbon dioxide.  Laparoscope was introduced and the abdomen is  explored.  Operative ports were placed along the right costal margin in the midline, midclavicular line, and anterior axillary line.  Fundus of the gallbladder is grasped and retracted cephalad.  Dissection is begun at the neck of the gallbladder.  Peritoneum is incised.  The infundibulum of the gallbladder is then dissected out down to the proximal cystic duct.  Proximal cystic duct is dissected out circumferentially.  It is clipped with ligaclips and then divided sharply.  Cystic artery is dissected out circumferentially and then divided between ligaclips with the electrocautery.  Gallbladder is then mobilized and excised from the gallbladder bed using the hook electrocautery.  Hemostasis is achieved in the gallbladder bed with the electrocautery.  Gallbladder is completely excised from the gallbladder bed and placed into an Endo Catch bag.  Next a biopsy is taken from the right lobe of the liver.  Along the liver edge just above the gallbladder bed a specimen is taken using the stone scoop forceps to remove approximately a 1 cm piece of the liver edge.  This is submitted to pathology for review.  Hemostasis is obtained at the biopsy site using the electrocautery.  Gallbladder bed and portal hilum are irrigated copiously with warm saline and good hemostasis is noted.  Biopsy site is irrigated and good hemostasis is noted.  Fluid is evacuated from the peritoneal cavity.  Gallbladder is then extracted in the Endo Catch bag through the umbilical port.  It is submitted to pathology along with the liver biopsy.  Pneumoperitoneum is released and all ports are removed under direct vision.  Good hemostasis is noted at all port sites.  Fascial defect at the umbilicus is then defined circumferentially with the electrocautery.  Defect is then closed transversely with interrupted #1 Novafil simple sutures.  Good hemostasis is noted.  Skin incisions are all closed with interrupted and running 4-0 Monocryl  subcuticular sutures.  Incisions are anesthetized with local anesthetic.  Wounds are washed and dried and then Dermabond is applied to all wounds as dressing.  Patient is awakened from anesthesia and brought to the recovery room in stable condition.  The patient tolerated the procedure well.   Darnell Level, MD [facility] Office: [phone]

## 2020-12-17 NOTE — Anesthesia Preprocedure Evaluation (Addendum)
Anesthesia Evaluation  Patient identified by MRN, date of birth, ID band Patient awake    Reviewed: Allergy & Precautions, NPO status , Patient's Chart, lab work & pertinent test results  Airway Mallampati: II  TM Distance: >3 FB Neck ROM: Full    Dental no notable dental hx. (+) Teeth Intact, Dental Advisory Given   Pulmonary asthma , former smoker,    Pulmonary exam normal breath sounds clear to auscultation       Cardiovascular hypertension, Pt. on home beta blockers and Pt. on medications Normal cardiovascular exam Rhythm:Regular Rate:Normal  12/13/20 echo  1. Left ventricular ejection fraction, by estimation, is 60 to 65%. The  left ventricle has normal function. The left ventricle has no regional  wall motion abnormalities. There is mild left ventricular hypertrophy.  Left ventricular diastolic parameters  are consistent with Grade II diastolic dysfunction (pseudonormalization).  2. Right ventricular systolic function is normal. The right ventricular  size is normal. There is normal pulmonary artery systolic pressure.    Neuro/Psych negative neurological ROS     GI/Hepatic negative GI ROS, Neg liver ROS,   Endo/Other  diabetes, Type 1, Insulin Dependent  Renal/GU Renal disease     Musculoskeletal  Pt has been unable to walk since covid-19 in early 2021 . Now unable to move legs   Abdominal (+) + obese,   Peds  Hematology Lab Results      Component                Value               Date                      WBC                      7.5                 12/17/2020                HGB                      12.2 (L)            12/17/2020                HCT                      38.5 (L)            12/17/2020                MCV                      99.2                12/17/2020                PLT                      191                 12/17/2020              Anesthesia Other Findings All:  see list   Reproductive/Obstetrics                            Anesthesia Physical  Anesthesia Plan  ASA: III  Anesthesia Plan: General   Post-op Pain Management:    Induction: Intravenous  PONV Risk Score and Plan: Treatment may vary due to age or medical condition, Dexamethasone and Ondansetron  Airway Management Planned: Oral ETT  Additional Equipment: None  Intra-op Plan:   Post-operative Plan: Extubation in OR  Informed Consent: I have reviewed the patients History and Physical, chart, labs and discussed the procedure including the risks, benefits and alternatives for the proposed anesthesia with the patient or authorized representative who has indicated his/her understanding and acceptance.     Dental advisory given  Plan Discussed with: CRNA and Anesthesiologist  Anesthesia Plan Comments:         Anesthesia Quick Evaluation

## 2020-12-17 NOTE — Anesthesia Procedure Notes (Signed)
Procedure Name: Intubation Performed by: Rosaland Lao, CRNA Pre-anesthesia Checklist: Patient identified, Emergency Drugs available, Suction available and Patient being monitored Patient Re-evaluated:Patient Re-evaluated prior to induction Oxygen Delivery Method: Circle system utilized Preoxygenation: Pre-oxygenation with 100% oxygen Induction Type: IV induction Ventilation: Mask ventilation without difficulty and Oral airway inserted - appropriate to patient size Laryngoscope Size: Mac and 4 Grade View: Grade II Tube type: Oral Number of attempts: 1 Airway Equipment and Method: Stylet and Oral airway Placement Confirmation: ETT inserted through vocal cords under direct vision,  positive ETCO2 and breath sounds checked- equal and bilateral Secured at: 22 cm Tube secured with: Tape Dental Injury: Teeth and Oropharynx as per pre-operative assessment

## 2020-12-17 NOTE — Progress Notes (Signed)
[facility] PROGRESS NOTE   Randy Liu PJA:[id number] DOB: 01-09-1951 DOA: 12/12/2020  PCP: Burman Freestone, MD  Brief History/Interval Summary: 70 y.o.WM PMHx Asthma, insulin-dependent type 2 diabetes, DM neuropathy, HTN, HLD,CKD stage III followed by nephrology, OSA on CPAP, history of PE on Eliquis, GERD, IBS.  Presented with abdominal pain nausea and vomiting.  Found to have abnormal LFTs.  He was also noted to be septic.  Concern was for cholangitis.  Patient was hospitalized.  Seen by gastroenterology.  Underwent ERCP.  General surgery is also following.   Consultants: Gastroenterology.  General surgery  Procedures:   4/1 echocardiogram;Left Ventricle: LVEF= 60 to 65%.  -Grade II diastolic dysfunction (pseudonormalization).  4/2 ERCP;Choledocholithiasis s/p biliary sphincterotomy  with balloon extraction. - Ascending cholangitis - Periampullary diverticula. - Cholelithiasis with patent cystic duct. - Biopsy was performed   Antibiotics: Anti-infectives (From admission, onward)   Start     Dose/Rate Route Frequency Ordered Stop   12/16/20 1100  fluconazole (DIFLUCAN) tablet 100 mg        100 mg Oral Daily 12/16/20 1005     12/16/20 1000  fluconazole (DIFLUCAN) IVPB 100 mg  Status:  Discontinued        100 mg 50 mL/hr over 60 Minutes Intravenous Every 24 hours 12/15/20 1427 12/16/20 1005   12/15/20 1530  fluconazole (DIFLUCAN) IVPB 200 mg        200 mg 100 mL/hr over 60 Minutes Intravenous  Once 12/15/20 1426 12/15/20 1701   12/15/20 0900  cefTRIAXone (ROCEPHIN) 2 g in sodium chloride 0.9 % 100 mL IVPB        2 g 200 mL/hr over 30 Minutes Intravenous Every 24 hours 12/15/20 0814     12/13/20 2200  vancomycin (VANCOREADY) IVPB 1500 mg/300 mL  Status:  Discontinued        1,500 mg 150 mL/hr over 120 Minutes Intravenous Every 24  hours 12/12/20 2054 12/13/20 1003   12/13/20 2200  vancomycin (VANCOREADY) IVPB 1250 mg/250 mL  Status:  Discontinued        1,250 mg 166.7 mL/hr over 90 Minutes Intravenous Every 24 hours 12/13/20 1003 12/13/20 1256   12/13/20 0800  metroNIDAZOLE (FLAGYL) IVPB 500 mg        500 mg 100 mL/hr over 60 Minutes Intravenous Every 8 hours 12/13/20 0241     12/12/20 2245  metroNIDAZOLE (FLAGYL) IVPB 500 mg        500 mg 100 mL/hr over 60 Minutes Intravenous  Once 12/12/20 2235 12/13/20 0108   12/12/20 2200  ceFEPIme (MAXIPIME) 2 g in sodium chloride 0.9 % 100 mL IVPB  Status:  Discontinued        2 g 200 mL/hr over 30 Minutes Intravenous Every 12 hours 12/12/20 2024 12/12/20 2033   12/12/20 2130  vancomycin (VANCOCIN) IVPB 1000 mg/200 mL premix       "Followed by" Linked Group Details   1,000 mg 200 mL/hr over 60 Minutes Intravenous  Once 12/12/20 2024 12/12/20 2348   12/12/20 2045  ceFEPIme (MAXIPIME) 2 g in sodium chloride 0.9 % 100 mL IVPB  Status:  Discontinued        2 g 200 mL/hr over 30 Minutes Intravenous Every 12 hours 12/12/20 2033 12/15/20 0814   12/12/20 2030  vancomycin (VANCOCIN) IVPB 1000 mg/200 mL premix       "Followed by" Linked Group Details   1,000 mg 200 mL/hr over 60 Minutes Intravenous  Once 12/12/20 2024 12/12/20 2225  Subjective/Interval History: Patient had a good day yesterday.  Denies any shortness of breath.  His wife is at the bedside.  Denies abdominal pain.  No nausea vomiting.  Denies any chest pain.    Assessment/Plan:  Acute cholangitis/severe sepsis Patient met criteria for severe sepsis on admission.  LFTs were noted to be abnormal.  Patient underwent CT abdomen followed by MRCP.  Also underwent ultrasound of the abdomen.  Gastroenterology and general surgery was consulted.   Patient underwent ERCP on 4/2.  Choledocholithiasis was noted.  Patient underwent sphincterotomy and balloon extraction. Currently patient remains on ceftriaxone and  metronidazole. LFTs noted to be improving as of today.  Continue to trend.  Cholelithiasis General surgery is following.  Patient to undergo cholecystectomy at some point.  In terms of his comorbidities patient remains stable.  He does have significant comorbidities however does not need any further testing to reduce his risk of perioperative complications.  This was discussed with the patient and his wife.  Discussed with Dr. Harlow Asa this morning as well.  He is stable as he can get. From a medical standpoint he may proceed to surgery without any further testing.  May benefit from monitoring in the stepdown unit postoperatively.  Klebsiella bacteremia Most likely secondary to above.  Sensitivities noted.  Patient remains on ceftriaxone and metronidazole.  Anticipate change to oral antibiotics after surgery.   History of PE Patient on Eliquis prior to admission.  Will do due to possible need for surgery.  Continue to hold since patient will be undergoing cholecystectomy later today.  Lower extremity Doppler studies negative for DVT.  No clinical suspicion for recurrent PE.  Old records were reviewed especially Care Everywhere.  Looks like his PE occurred in September of 2020 in the setting of hospitalization.  He was continued for being on 6 months due to poor mobility.  Resume Eliquis when cleared to do so by general surgery.  Acute respiratory failure with hypoxia/obstructive sleep apnea Does not usually use oxygen at home.  Apparently has been requiring 4 L here in the hospital.  Was weaned off of oxygen yesterday.  Noted to be on CPAP this morning.  Pulmonary examination is unremarkable for this morning.  History of asthma Stable.  No wheezing heard.  Chronic diastolic CHF Echocardiogram showed diastolic dysfunction.  He does have a pedal edema.  Otherwise does not have any respiratory issues currently.  No pulmonary edema noted on chest x-ray.  Not noted to be on any diuretics. Seems to be  stable.  Hyponatremia/hypokalemia/hypophosphatemia Sodium level stable.  Potassium is better.  Phosphorus is better as well.  Will give additional dose.  Diabetes mellitus type 2 uncontrolled with hyperglycemia HbA1c 7.9.  Continue SSI.  Essential hypertension Monitor blood pressures closely.  Noted to be on amlodipine hydralazine.  Acute kidney injury on chronic kidney disease stage IIIa Creatinine peaked at 1.81.  Improved and seems to be back to baseline.  Monitor urine output.  UTI with yeast 40,000 colonies noted on culture.  Patient was started on fluconazole.  QT prolongation Avoid QT prolonging medications.  Physical deconditioning Patient mentions that he does not ambulate much at home due to history of peripheral neuropathy.  He also had severe physical deconditioning when he had COVID-19.  Obesity Estimated body mass index is 38.1 kg/m as calculated from the following:   Height as of this encounter: _0  (1.854 m).   Weight as of this encounter: 131 kg.    DVT Prophylaxis: SCDs Code  Status: Full code Family Communication: Discussed with the patient and his wife Disposition Plan: Plan is for cholecystectomy today.  PT and OT.  Hopefully home in a few days.  Status is: Inpatient  Remains inpatient appropriate because:Ongoing active pain requiring inpatient pain management, IV treatments appropriate due to intensity of illness or inability to take PO and Inpatient level of care appropriate due to severity of illness   Dispo: The patient is from: Home              Anticipated d/c is to: Home              Patient currently is not medically stable to d/c.   Difficult to place patient No      Medications:  Scheduled: . amLODipine  10 mg Oral Daily  . chlorhexidine  15 mL Mouth Rinse BID  . fluconazole  100 mg Oral Daily  . hydrALAZINE  25 mg Oral Q6H  . insulin aspart  0-20 Units Subcutaneous TID WC  . insulin aspart  0-5 Units Subcutaneous QHS  .  ipratropium-albuterol  3 mL Nebulization BID  . latanoprost  1 drop Left Eye QHS  . mouth rinse  15 mL Mouth Rinse q12n4p   Continuous: . cefTRIAXone (ROCEPHIN)  IV Stopped (12/16/20 1053)  . metronidazole 500 mg (12/17/20 0824)   WUJ:[id number] **OR** acetaminophen, albuterol, fentaNYL (SUBLIMAZE) injection, metoprolol tartrate, ondansetron (ZOFRAN) IV, prochlorperazine   Objective:  Vital Signs  Vitals:   12/16/20 2037 12/17/20 0500 12/17/20 0527 12/17/20 0833  BP: (!) 154/72  130/70   Pulse: 79  80   Resp: 18  20   Temp: 97.7 F (36.5 C)  97.9 F (36.6 C)   TempSrc: Oral  Oral   SpO2: 91%  94% 96%  Weight:  131 kg    Height:        Intake/Output Summary (Last 24 hours) at 12/17/2020 0927 Last data filed at 12/17/2020 0600 Gross per 24 hour  Intake 699.16 ml  Output 2100 ml  Net -1400.84 ml   Filed Weights   12/15/20 0427 12/16/20 0500 12/17/20 0500  Weight: 130.6 kg 132.1 kg 131 kg    General appearance: Awake alert.  In no distress Resp: Clear to auscultation bilaterally.  Normal effort Cardio: S1-S2 is normal regular.  No S3-S4.  No rubs murmurs or bruit GI: Abdomen is soft.  Nontender nondistended.  Bowel sounds are present normal.  No masses organomegaly Extremities: Mild edema bilateral lower extremities Neurologic: Alert and oriented x3.  No focal neurological deficits.     Lab Results:  Data Reviewed: I have personally reviewed following labs and imaging studies  CBC: Recent Labs  Lab 12/12/20 1940 12/13/20 0258 12/14/20 0603 12/15/20 0233 12/16/20 0349 12/17/20 0454  WBC 6.5 12.6* 6.1 7.7 7.3 7.5  NEUTROABS 5.7  --  4.6 5.3 4.8 4.5  HGB 14.9 13.3 12.4* 12.2* 12.0* 12.2*  HCT 46.3 42.1 38.8* 39.3 37.6* 38.5*  MCV 97.5 100.2* 99.5 101.8* 98.9 99.2  PLT 198 177 138* 137* 165 130    Basic Metabolic Panel: Recent Labs  Lab 12/13/20 0258 12/14/20 0603 12/15/20 0233 12/16/20 0349 12/17/20 0454  NA 132* 135 132* 132* 135  K 5.1 3.9 4.9  3.3* 3.8  CL 99 103 101 102 103  CO2 19* 19* 19* 21* 20*  GLUCOSE 298* 150* 155* 149* 183*  BUN 30* 27* 24* 15 11  CREATININE 1.81* 1.34* 1.08 0.77 0.84  CALCIUM 8.8* 8.9 8.8* 8.8* 9.2  MG 2.0 2.0 2.0 1.7 2.0  PHOS  --  2.1* 1.9* 1.2* 2.0*    GFR: Estimated Creatinine Clearance: 114.4 mL/min (by C-G formula based on SCr of 0.84 mg/dL).  Liver Function Tests: Recent Labs  Lab 12/13/20 0258 12/14/20 0603 12/15/20 0233 12/16/20 0349 12/17/20 0454  AST 121* 132* 182* 130* 78*  ALT 163* 127* 137* 121* 95*  ALKPHOS 76 66 90 107 124  BILITOT 5.7* 5.0* 6.1* 4.8* 3.4*  PROT 5.9* 5.9* 5.8* 5.6* 5.9*  ALBUMIN 2.7* 2.9* 2.7* 2.3* 2.4*    Recent Labs  Lab 12/12/20 1940  LIPASE 25    Coagulation Profile: Recent Labs  Lab 12/12/20 1940 12/13/20 0258  INR 1.2 1.4*     CBG: Recent Labs  Lab 12/16/20 0731 12/16/20 1144 12/16/20 1650 12/16/20 2033 12/17/20 0819  GLUCAP 150* 229* 194* 180* 182*    Anemia Panel: No results for input(s): VITAMINB12, FOLATE, FERRITIN, TIBC, IRON, RETICCTPCT in the last 72 hours.  Recent Results (from the past 240 hour(s))  Culture, blood (Routine x 2)     Status: Abnormal   Collection Time: 12/12/20  7:45 PM   Specimen: BLOOD  Result Value Ref Range Status   Specimen Description   Final    BLOOD RIGHT ANTECUBITAL Performed at [facility], [address] [place], [place] [address]    Special Requests   Final    BOTTLES DRAWN AEROBIC AND ANAEROBIC Blood Culture adequate volume Performed at [facility], [address] [place], [place] [address]    Culture  Setup Time   Final    GRAM NEGATIVE RODS IN BOTH AEROBIC AND ANAEROBIC BOTTLES CRITICAL RESULT CALLED TO, READ BACK BY AND VERIFIED WITHGuadlupe Spanish @ 2947 12/13/20 EB Performed at [facility], [place] [address], [place], [place] [address]    Culture KLEBSIELLA PNEUMONIAE (A)  Final   Report Status 12/15/2020 FINAL  Final   Organism ID, Bacteria  KLEBSIELLA PNEUMONIAE  Final      Susceptibility   Klebsiella pneumoniae - MIC*    AMPICILLIN >=32 RESISTANT Resistant     CEFAZOLIN <=4 SENSITIVE Sensitive     CEFEPIME <=0.12 SENSITIVE Sensitive     CEFTAZIDIME <=1 SENSITIVE Sensitive     CEFTRIAXONE <=0.25 SENSITIVE Sensitive     CIPROFLOXACIN <=0.25 SENSITIVE Sensitive     GENTAMICIN <=1 SENSITIVE Sensitive     IMIPENEM <=0.25 SENSITIVE Sensitive     TRIMETH/SULFA <=20 SENSITIVE Sensitive     AMPICILLIN/SULBACTAM 8 SENSITIVE Sensitive     PIP/TAZO <=4 SENSITIVE Sensitive     * KLEBSIELLA PNEUMONIAE  Blood Culture ID Panel (Reflexed)     Status: Abnormal   Collection Time: 12/12/20  7:45 PM  Result Value Ref Range Status   Enterococcus faecalis NOT DETECTED NOT DETECTED Final   Enterococcus Faecium NOT DETECTED NOT DETECTED Final   Listeria monocytogenes NOT DETECTED NOT DETECTED Final   Staphylococcus species NOT DETECTED NOT DETECTED Final   Staphylococcus aureus (BCID) NOT DETECTED NOT DETECTED Final   Staphylococcus epidermidis NOT DETECTED NOT DETECTED Final   Staphylococcus lugdunensis NOT DETECTED NOT DETECTED Final   Streptococcus species NOT DETECTED NOT DETECTED Final   Streptococcus agalactiae NOT DETECTED NOT DETECTED Final   Streptococcus pneumoniae NOT DETECTED NOT DETECTED Final   Streptococcus pyogenes NOT DETECTED NOT DETECTED Final   A.calcoaceticus-baumannii NOT DETECTED NOT DETECTED Final   Bacteroides fragilis NOT DETECTED NOT DETECTED Final   Enterobacterales DETECTED (A) NOT  DETECTED Final    Comment: Enterobacterales represent a large order of gram negative bacteria, not a single organism. CRITICAL RESULT CALLED TO, READ BACK BY AND VERIFIED WITH: N GLOGOVAC @ 8416 12/13/20 EB    Enterobacter cloacae complex NOT DETECTED NOT DETECTED Final   Escherichia coli NOT DETECTED NOT DETECTED Final   Klebsiella aerogenes NOT DETECTED NOT DETECTED Final   Klebsiella oxytoca NOT DETECTED NOT DETECTED Final    Klebsiella pneumoniae DETECTED (A) NOT DETECTED Final    Comment: CRITICAL RESULT CALLED TO, READ BACK BY AND VERIFIED WITH: N GLOGOVAC @ 1153 12/13/20 EB    Proteus species NOT DETECTED NOT DETECTED Final   Salmonella species NOT DETECTED NOT DETECTED Final   Serratia marcescens NOT DETECTED NOT DETECTED Final   Haemophilus influenzae NOT DETECTED NOT DETECTED Final   Neisseria meningitidis NOT DETECTED NOT DETECTED Final   Pseudomonas aeruginosa NOT DETECTED NOT DETECTED Final   Stenotrophomonas maltophilia NOT DETECTED NOT DETECTED Final   Candida albicans NOT DETECTED NOT DETECTED Final   Candida auris NOT DETECTED NOT DETECTED Final   Candida glabrata NOT DETECTED NOT DETECTED Final   Candida krusei NOT DETECTED NOT DETECTED Final   Candida parapsilosis NOT DETECTED NOT DETECTED Final   Candida tropicalis NOT DETECTED NOT DETECTED Final   Cryptococcus neoformans/gattii NOT DETECTED NOT DETECTED Final   CTX-M ESBL NOT DETECTED NOT DETECTED Final   Carbapenem resistance IMP NOT DETECTED NOT DETECTED Final   Carbapenem resistance KPC NOT DETECTED NOT DETECTED Final   Carbapenem resistance NDM NOT DETECTED NOT DETECTED Final   Carbapenem resist OXA 48 LIKE NOT DETECTED NOT DETECTED Final   Carbapenem resistance VIM NOT DETECTED NOT DETECTED Final    Comment: Performed at [facility], [address], [place], [place] [address]  Resp Panel by RT-PCR (Flu A&B, Covid) Nasopharyngeal Swab     Status: None   Collection Time: 12/12/20  8:15 PM   Specimen: Nasopharyngeal Swab; Nasopharyngeal(NP) swabs in vial transport medium  Result Value Ref Range Status   SARS Coronavirus 2 by RT PCR NEGATIVE NEGATIVE Final    Comment: (NOTE) SARS-CoV-2 target nucleic acids are NOT DETECTED.  The SARS-CoV-2 RNA is generally detectable in upper respiratory specimens during the acute phase of infection. The lowest concentration of SARS-CoV-2 viral copies this assay can detect is 138 copies/mL. A  negative result does not preclude SARS-Cov-2 infection and should not be used as the sole basis for treatment or other patient management decisions. A negative result may occur with  improper specimen collection/handling, submission of specimen other than nasopharyngeal swab, presence of viral mutation(s) within the areas targeted by this assay, and inadequate number of viral copies(<138 copies/mL). A negative result must be combined with clinical observations, patient history, and epidemiological information. The expected result is Negative.  Fact Sheet for Patients:  EntrepreneurPulse.com.au  Fact Sheet for Healthcare Providers:  IncredibleEmployment.be  This test is no t yet approved or cleared by the [place] FDA and  has been authorized for detection and/or diagnosis of SARS-CoV-2 by FDA under an Emergency Use Authorization (EUA). This EUA will remain  in effect (meaning this test can be used) for the duration of the COVID-19 declaration under Section 564(b)(1) of the Act, 21 U.S.C.section 360bbb-3(b)(1), unless the authorization is terminated  or revoked sooner.       Influenza A by PCR NEGATIVE NEGATIVE Final   Influenza B by PCR NEGATIVE NEGATIVE Final    Comment: (NOTE) The Xpert Xpress SARS-CoV-2/FLU/RSV  plus assay is intended as an aid in the diagnosis of influenza from Nasopharyngeal swab specimens and should not be used as a sole basis for treatment. Nasal washings and aspirates are unacceptable for Xpert Xpress SARS-CoV-2/FLU/RSV testing.  Fact Sheet for Patients: EntrepreneurPulse.com.au  Fact Sheet for Healthcare Providers: IncredibleEmployment.be  This test is not yet approved or cleared by the [place] FDA and has been authorized for detection and/or diagnosis of SARS-CoV-2 by FDA under an Emergency Use Authorization (EUA). This EUA will remain in effect (meaning this test can  be used) for the duration of the COVID-19 declaration under Section 564(b)(1) of the Act, 21 U.S.C. section 360bbb-3(b)(1), unless the authorization is terminated or revoked.  Performed at [facility], [address] [place], [place] [address]   Culture, blood (Routine x 2)     Status: Abnormal   Collection Time: 12/12/20  9:20 PM   Specimen: BLOOD  Result Value Ref Range Status   Specimen Description   Final    BLOOD Blood Culture adequate volume Performed at [facility], [address] [place], [place] [address]    Special Requests   Final    BOTTLES DRAWN AEROBIC AND ANAEROBIC LEFT ANTECUBITAL Performed at [facility], [address] [place], [place] [address]    Culture  Setup Time   Final    GRAM NEGATIVE RODS IN BOTH AEROBIC AND ANAEROBIC BOTTLES CRITICAL VALUE NOTED.  VALUE IS CONSISTENT WITH PREVIOUSLY REPORTED AND CALLED VALUE.    Culture (A)  Final    KLEBSIELLA PNEUMONIAE SUSCEPTIBILITIES PERFORMED ON PREVIOUS CULTURE WITHIN THE LAST 5 DAYS. Performed at [facility], [place] [address], [place], [place] [address]    Report Status 12/15/2020 FINAL  Final  MRSA PCR Screening     Status: None   Collection Time: 12/13/20  1:29 AM   Specimen: Nasopharyngeal  Result Value Ref Range Status   MRSA by PCR NEGATIVE NEGATIVE Final    Comment:        The GeneXpert MRSA Assay (FDA approved for NASAL specimens only), is one component of a comprehensive MRSA colonization surveillance program. It is not intended to diagnose MRSA infection nor to guide or monitor treatment for MRSA infections. Performed at [facility], [place] [address], [place], [place] [address]   Urine Culture     Status: Abnormal   Collection Time: 12/14/20 12:39 AM   Specimen: Urine, Random  Result Value Ref Range Status   Specimen Description   Final    URINE, RANDOM Performed at [facility] [address]  [place], [place] [address]    Special Requests   Final    NONE Performed at [facility], [place] [address] [place],  [address]    Culture 40,000 COLONIES/mL YEAST (A)  Final   Report Status 12/15/2020 FINAL  Final      Radiology Studies: No results found.     LOS: 4 days   Ayse Mccartin [employer] on [email or web address]  12/17/2020, 9:27 AM

## 2020-12-17 NOTE — Progress Notes (Signed)
Assessment & Plan: HD #6 - Cholangitis  - s/p successful ERCP 4/2 with sphincterotomy and removal of 3 stones. GI recommends liver biopsy at the time of cholecystectomy to rule out underlying cirrhosis - LFTs are trending down  HTN DM OSA - CPAP Asthma - uses inhaler PRN, not on home O2.  Acute respiratory failure with hypoxia - Currently on 2.5L Acute diastolic CHF - EF 60-65% on ECHO 12/13/20 CKD stage III - baseline Cr 1~.48 Wheelchair bound x1 year post-covid-19 infection Obesity BMI 38.42 Hx PE on eliquis (last dose 3/31) UTI - yeast on Ucx, started diflucan Bacteremia - klebsiella pneumoniae  ID - diflucan 4/3>>, rocephin 4/3>>, flagyl 3/31>>, vancomycin 3/31>>4/1, cefepime 3/31 x1 FEN - IVF, FLD VTE - SCDs, hold eliquis/ ok for IV heparin if needed Foley - none Follow up - TBD  Patient for cholecystectomy during this admission given history of cholangitis and CBD stones.  GI also requests liver biopsy.  Patient has small umbilical hernia which may be amenable to primary repair.  Discussed with patient and wife at bedside this AM.  Will review with medical team, Dr. Rito Ehrlich, and if acceptable plan to proceed to OR today.  The risks and benefits of the procedure have been discussed at length with the patient.  The patient understands the proposed procedure, potential alternative treatments, and the course of recovery to be expected.  All of the patient's questions have been answered at this time.  The patient wishes to proceed with surgery.       Darnell Level, MD       [facility]       Office: [phone]   Chief Complaint: Ascending cholangitis, choledocholithiasis  Subjective: Patient in bed, comfortable.  Wife at bedside.  Objective: Vital signs in last 24 hours: Temp:  [97.6 F (36.4 C)-97.9 F (36.6 C)] 97.9 F (36.6 C) (04/05 0527) Pulse Rate:  [75-80] 80 (04/05 0527) Resp:  [18-20] 20 (04/05 0527) BP: (130-154)/(68-72) 130/70  (04/05 0527) SpO2:  [91 %-97 %] 94 % (04/05 0527) Weight:  [131 kg] 131 kg (04/05 0500) Last BM Date: 12/12/20  Intake/Output from previous day: 04/04 0701 - 04/05 0700 In: 699.2 [IV Piggyback:699.2] Out: 2100 [Urine:2100] Intake/Output this shift: No intake/output data recorded.  Physical Exam: HEENT - sclerae clear, mucous membranes moist; BIPAP on Neck - soft Abdomen - soft, obese, non-tender; small umbilical hernia with incarcerated omentum Neuro - alert & oriented, no focal deficits  Lab Results:  Recent Labs    12/16/20 0349 12/17/20 0454  WBC 7.3 7.5  HGB 12.0* 12.2*  HCT 37.6* 38.5*  PLT 165 191   BMET Recent Labs    12/16/20 0349 12/17/20 0454  NA 132* 135  K 3.3* 3.8  CL 102 103  CO2 21* 20*  GLUCOSE 149* 183*  BUN 15 11  CREATININE 0.77 0.84  CALCIUM 8.8* 9.2   PT/INR No results for input(s): LABPROT, INR in the last 72 hours. Comprehensive Metabolic Panel:    Component Value Date/Time   NA 135 12/17/2020 0454   NA 132 (L) 12/16/2020 0349   K 3.8 12/17/2020 0454   K 3.3 (L) 12/16/2020 0349   CL 103 12/17/2020 0454   CL 102 12/16/2020 0349   CO2 20 (L) 12/17/2020 0454   CO2 21 (L) 12/16/2020 0349   BUN 11 12/17/2020 0454   BUN 15 12/16/2020 0349   CREATININE 0.84 12/17/2020 0454   CREATININE 0.77 12/16/2020 0349   GLUCOSE  183 (H) 12/17/2020 0454   GLUCOSE 149 (H) 12/16/2020 0349   CALCIUM 9.2 12/17/2020 0454   CALCIUM 8.8 (L) 12/16/2020 0349   AST 78 (H) 12/17/2020 0454   AST 130 (H) 12/16/2020 0349   ALT 95 (H) 12/17/2020 0454   ALT 121 (H) 12/16/2020 0349   ALKPHOS 124 12/17/2020 0454   ALKPHOS 107 12/16/2020 0349   BILITOT 3.4 (H) 12/17/2020 0454   BILITOT 4.8 (H) 12/16/2020 0349   PROT 5.9 (L) 12/17/2020 0454   PROT 5.6 (L) 12/16/2020 0349   ALBUMIN 2.4 (L) 12/17/2020 0454   ALBUMIN 2.3 (L) 12/16/2020 0349    Studies/Results: No results found.    Darnell Level 12/17/2020  Patient ID: Randy Liu, male   DOB:  [birth year], 70 y.o.   MRN: [id number]

## 2020-12-17 NOTE — Anesthesia Postprocedure Evaluation (Signed)
Anesthesia Post Note  Patient: Randy Liu  Procedure(s) Performed: LAPAROSCOPIC CHOLECYSTECTOMY, LIVER BIOPSY, PRIMARY UMBILICAL HERNIA REPAIR (N/A Abdomen)     Patient location during evaluation: PACU Anesthesia Type: General Level of consciousness: awake and alert, oriented and patient cooperative Pain management: pain level controlled Vital Signs Assessment: post-procedure vital signs reviewed and stable Respiratory status: spontaneous breathing, nonlabored ventilation and respiratory function stable Cardiovascular status: blood pressure returned to baseline and stable Postop Assessment: no apparent nausea or vomiting Anesthetic complications: no Comments: Hyperglycemia treated with sliding scale   No complications documented.  Last Vitals:  Vitals:   12/17/20 1240 12/17/20 1245  BP: (!) 141/74 (!) 143/73  Pulse: 76 76  Resp: (!) 26 (!) 24  Temp: 36.6 C   SpO2: 94% 97%    Last Pain:  Vitals:   12/17/20 1240  TempSrc:   PainSc: Asleep                 Lannie Fields

## 2020-12-17 NOTE — Discharge Instructions (Signed)
[facility] ° °Please arrive at least 30 min before your appointment to complete your check in paperwork.  If you are unable to arrive 30 min prior to your appointment time we may have to cancel or reschedule you. °LAPAROSCOPIC SURGERY: POST OP INSTRUCTIONS °Always review your discharge instruction sheet given to you by the facility where your surgery was performed. °IF YOU HAVE DISABILITY OR FAMILY LEAVE FORMS, YOU MUST BRING THEM TO THE OFFICE FOR PROCESSING.   °DO NOT GIVE THEM TO YOUR DOCTOR. ° °PAIN CONTROL ° °1. First take acetaminophen (Tylenol) AND/or ibuprofen (Advil) to control your pain after surgery.  Follow directions on package.  Taking acetaminophen (Tylenol) and/or ibuprofen (Advil) regularly after surgery will help to control your pain and lower the amount of prescription pain medication you may need.  You should not take more than 4,000 mg (4 grams) of acetaminophen (Tylenol) in 24 hours.  You should not take ibuprofen (Advil), aleve, motrin, naprosyn or other NSAIDS if you have a history of stomach ulcers or chronic kidney disease.  °2. A prescription for pain medication may be given to you upon discharge.  Take your pain medication as prescribed, if you still have uncontrolled pain after taking acetaminophen (Tylenol) or ibuprofen (Advil). °3. Use ice packs to help control pain. °4. If you need a refill on your pain medication, please contact your pharmacy.  They will contact our office to request authorization. Prescriptions will not be filled after 5pm or on week-ends. ° °HOME MEDICATIONS °5. Take your usually prescribed medications unless otherwise directed. ° °DIET °6. You should follow a light diet the first few days after arrival home.  Be sure to include lots of fluids daily. Avoid fatty, fried foods.  ° °CONSTIPATION °7. It is common to experience some constipation after surgery and if you are taking pain medication.  Increasing fluid intake and taking a stool  softener (such as Colace) will usually help or prevent this problem from occurring.  A mild laxative (Milk of Magnesia or Miralax) should be taken according to package instructions if there are no bowel movements after 48 hours. ° °WOUND/INCISION CARE °8. Most patients will experience some swelling and bruising in the area of the incisions.  Ice packs will help.  Swelling and bruising can take several days to resolve.  °9. Unless discharge instructions indicate otherwise, follow guidelines below  °a. STERI-STRIPS - you may remove your outer bandages 48 hours after surgery, and you may shower at that time.  You have steri-strips (small skin tapes) in place directly over the incision.  These strips should be left on the skin for 7-10 days.   °b. DERMABOND/SKIN GLUE - you may shower in 24 hours.  The glue will flake off over the next 2-3 weeks. °10. Any sutures or staples will be removed at the office during your follow-up visit. ° °ACTIVITIES °11. You may resume regular (light) daily activities beginning the next day--such as daily self-care, walking, climbing stairs--gradually increasing activities as tolerated.  You may have sexual intercourse when it is comfortable.  Refrain from any heavy lifting or straining until approved by your doctor. °a. You may drive when you are no longer taking prescription pain medication, you can comfortably wear a seatbelt, and you can safely maneuver your car and apply brakes. ° °FOLLOW-UP °12. You should see your doctor in the office for a follow-up appointment approximately 2-3 weeks after your surgery.  You should have been given your post-op/follow-up appointment when   your surgery was scheduled.  If you did not receive a post-op/follow-up appointment, make sure that you call for this appointment within a day or two after you arrive home to insure a convenient appointment time. ° °OTHER INSTRUCTIONS ° °WHEN TO CALL YOUR DOCTOR: °1. Fever over 101.0 °2. Inability to  urinate °3. Continued bleeding from incision. °4. Increased pain, redness, or drainage from the incision. °5. Increasing abdominal pain ° °The clinic staff is available to answer your questions during regular business hours.  Please don’t hesitate to call and ask to speak to one of the nurses for clinical concerns.  If you have a medical emergency, go to the nearest emergency room or call 911.  A surgeon from [facility] is always on call at the hospital. °[address], [address], [place], [place]  [address] ? [address] [address], [place], [place]   [address] °[phone] ? [phone] ? FAX [phone] ° ° ° °

## 2020-12-17 NOTE — Progress Notes (Signed)
Occupational Therapy Cancellation:  Patient Details Name: Randy Liu MRN: [id number] DOB: 1950/11/29 Today's Date: 12/17/2020  Pt off floor for cholecystectomy.  Will reattempt at a later time as able.    Victorino Dike, OT Acute Rehab Services Office: [phone] 12/17/2020    Theodoro Clock 12/17/2020, 9:40 AM

## 2020-12-17 NOTE — Transfer of Care (Signed)
Immediate Anesthesia Transfer of Care Note  Patient: Randy Liu  Procedure(s) Performed: LAPAROSCOPIC CHOLECYSTECTOMY, LIVER BIOPSY, PRIMARY UMBILICAL HERNIA REPAIR (N/A Abdomen)  Patient Location: PACU  Anesthesia Type:General  Level of Consciousness: awake, alert  and oriented  Airway & Oxygen Therapy: Patient Spontanous Breathing and Patient connected to face mask  Post-op Assessment: Report given to RN and Post -op Vital signs reviewed and stable  Post vital signs: Reviewed and stable  Last Vitals:  Vitals Value Taken Time  BP 141/74 12/17/20 1240  Temp    Pulse 77 12/17/20 1243  Resp 17 12/17/20 1243  SpO2 94 % 12/17/20 1243  Vitals shown include unvalidated device data.  Last Pain:  Vitals:   12/17/20 0954  TempSrc: Oral  PainSc: 0-No pain         Complications: No complications documented.

## 2020-12-17 NOTE — Progress Notes (Signed)
PT Cancellation Note  Patient Details Name: Randy Liu MRN: [id number] DOB: 1951/06/10   Cancelled Treatment:    Reason Eval/Treat Not Completed: Planned surgery today. Rada Hay 12/17/2020, 9:02 AM  Blanchard Kelch PT Acute Rehabilitation Services Pager [phone] Office [phone]

## 2020-12-18 ENCOUNTER — Encounter (HOSPITAL_COMMUNITY): Payer: Self-pay | Admitting: Surgery

## 2020-12-18 DIAGNOSIS — K8309 Other cholangitis: Secondary | ICD-10-CM | POA: Diagnosis not present

## 2020-12-18 LAB — CBC WITH DIFFERENTIAL/PLATELET
Abs Immature Granulocytes: 0.58 10*3/uL — ABNORMAL HIGH (ref 0.00–0.07)
Basophils Absolute: 0 10*3/uL (ref 0.0–0.1)
Basophils Relative: 0 %
Eosinophils Absolute: 0 10*3/uL (ref 0.0–0.5)
Eosinophils Relative: 0 %
HCT: 41 % (ref 39.0–52.0)
Hemoglobin: 12.5 g/dL — ABNORMAL LOW (ref 13.0–17.0)
Immature Granulocytes: 7 %
Lymphocytes Relative: 15 %
Lymphs Abs: 1.2 10*3/uL (ref 0.7–4.0)
MCH: 30.9 pg (ref 26.0–34.0)
MCHC: 30.5 g/dL (ref 30.0–36.0)
MCV: 101.5 fL — ABNORMAL HIGH (ref 80.0–100.0)
Monocytes Absolute: 0.6 10*3/uL (ref 0.1–1.0)
Monocytes Relative: 7 %
Neutro Abs: 5.5 10*3/uL (ref 1.7–7.7)
Neutrophils Relative %: 71 %
Platelets: 205 10*3/uL (ref 150–400)
RBC: 4.04 MIL/uL — ABNORMAL LOW (ref 4.22–5.81)
RDW: 16.1 % — ABNORMAL HIGH (ref 11.5–15.5)
WBC: 7.9 10*3/uL (ref 4.0–10.5)
nRBC: 0 % (ref 0.0–0.2)

## 2020-12-18 LAB — COMPREHENSIVE METABOLIC PANEL
ALT: 72 U/L — ABNORMAL HIGH (ref 0–44)
AST: 42 U/L — ABNORMAL HIGH (ref 15–41)
Albumin: 2.5 g/dL — ABNORMAL LOW (ref 3.5–5.0)
Alkaline Phosphatase: 121 U/L (ref 38–126)
Anion gap: 15 (ref 5–15)
BUN: 21 mg/dL (ref 8–23)
CO2: 20 mmol/L — ABNORMAL LOW (ref 22–32)
Calcium: 9.5 mg/dL (ref 8.9–10.3)
Chloride: 99 mmol/L (ref 98–111)
Creatinine, Ser: 1.01 mg/dL (ref 0.61–1.24)
GFR, Estimated: >60 mL/min (ref >60)
Glucose, Bld: 266 mg/dL — ABNORMAL HIGH (ref 70–99)
Potassium: 5.8 mmol/L — ABNORMAL HIGH (ref 3.5–5.1)
Sodium: 134 mmol/L — ABNORMAL LOW (ref 135–145)
Total Bilirubin: 2.6 mg/dL — ABNORMAL HIGH (ref 0.3–1.2)
Total Protein: 6.2 g/dL — ABNORMAL LOW (ref 6.5–8.1)

## 2020-12-18 LAB — BASIC METABOLIC PANEL
Anion gap: 14 (ref 5–15)
BUN: 27 mg/dL — ABNORMAL HIGH (ref 8–23)
CO2: 21 mmol/L — ABNORMAL LOW (ref 22–32)
Calcium: 9.3 mg/dL (ref 8.9–10.3)
Chloride: 99 mmol/L (ref 98–111)
Creatinine, Ser: 1.12 mg/dL (ref 0.61–1.24)
GFR, Estimated: >60 mL/min (ref >60)
Glucose, Bld: 311 mg/dL — ABNORMAL HIGH (ref 70–99)
Potassium: 4.2 mmol/L (ref 3.5–5.1)
Sodium: 134 mmol/L — ABNORMAL LOW (ref 135–145)

## 2020-12-18 LAB — GLUCOSE, CAPILLARY
Glucose-Capillary: 300 mg/dL — ABNORMAL HIGH (ref 70–99)
Glucose-Capillary: 265 mg/dL — ABNORMAL HIGH (ref 70–99)
Glucose-Capillary: 301 mg/dL — ABNORMAL HIGH (ref 70–99)
Glucose-Capillary: 340 mg/dL — ABNORMAL HIGH (ref 70–99)
Glucose-Capillary: 353 mg/dL — ABNORMAL HIGH (ref 70–99)

## 2020-12-18 LAB — MAGNESIUM: Magnesium: 1.9 mg/dL (ref 1.7–2.4)

## 2020-12-18 LAB — PHOSPHORUS: Phosphorus: 3.3 mg/dL (ref 2.5–4.6)

## 2020-12-18 MED ORDER — SODIUM ZIRCONIUM CYCLOSILICATE 5 G PO PACK
5.0000 g | PACK | Freq: Once | ORAL | Status: AC
  Administered 2020-12-18: 5 g via ORAL
  Filled 2020-12-18: qty 1

## 2020-12-18 MED ORDER — METOCLOPRAMIDE HCL 5 MG PO TABS
5.0000 mg | ORAL_TABLET | Freq: Every day | ORAL | Status: DC
  Administered 2020-12-18 – 2020-12-22 (×5): 5 mg via ORAL
  Filled 2020-12-18 (×5): qty 1

## 2020-12-18 MED ORDER — APIXABAN 5 MG PO TABS
5.0000 mg | ORAL_TABLET | Freq: Two times a day (BID) | ORAL | Status: DC
  Administered 2020-12-18 – 2020-12-23 (×10): 5 mg via ORAL
  Filled 2020-12-18 (×10): qty 1

## 2020-12-18 MED ORDER — POLYETHYLENE GLYCOL 3350 17 G PO PACK
17.0000 g | PACK | Freq: Two times a day (BID) | ORAL | Status: DC
  Administered 2020-12-18 – 2020-12-21 (×8): 17 g via ORAL
  Filled 2020-12-18 (×11): qty 1

## 2020-12-18 MED ORDER — SENNOSIDES-DOCUSATE SODIUM 8.6-50 MG PO TABS
1.0000 | ORAL_TABLET | Freq: Two times a day (BID) | ORAL | Status: AC
  Administered 2020-12-18 – 2020-12-19 (×4): 1 via ORAL
  Filled 2020-12-18 (×4): qty 1

## 2020-12-18 NOTE — Progress Notes (Addendum)
[facility] PROGRESS NOTE   Randy Liu GEZ:[id number] DOB: 16-Sep-1950 DOA: 12/12/2020  PCP: Burman Freestone, MD  Brief History/Interval Summary: 70 y.o.WM PMHx Asthma, insulin-dependent type 2 diabetes, DM neuropathy, HTN, HLD,CKD stage III followed by nephrology, OSA on CPAP, history of PE on Eliquis, GERD, IBS.  Presented with abdominal pain nausea and vomiting.  Found to have abnormal LFTs.  He was also noted to be septic.  Concern was for cholangitis.  Patient was hospitalized.  Seen by gastroenterology.  Underwent ERCP.  General surgery is also following.  Subjective/Interval History: No acute issues or events overnight, tolerated procedure quite well yesterday, abdomen somewhat sore but well controlled pain on current regimen.  Denies any recent bowel movement, does not feel constipated but is concerned about lack of BM over the past few days.  Otherwise denies nausea vomiting diarrhea headache fevers or chills.  Assessment/Plan:  Acute cholangitis/severe sepsis, POA Patient met criteria for severe sepsis on admission with abnormal LFTs -CT abdomen followed by MRCP and ultrasound of the abdomen -GI and general surgery following, appreciate insight and recommendations -ERCP 12/14/20 with notable choledocholithiasis and sphincterotomy with balloon extraction -Cholecystectomy 12/17/2020 Currently patient remains on ceftriaxone and metronidazole. LFTs noted to be improving as of today.  Continue to trend.  Cholelithiasis -Status post cholecystectomy 12/17/2020; tolerated well  Klebsiella bacteremia -Most likely secondary to above -Completed antibiotics 12/18/20  Hyperkalemia  -Potassium 5.8, Lokelma dose x1, repeat BMP this afternoon and follow morning labs *Update 4.2 this afternoon  History of PE - Patient on Eliquis prior to admission.   - Restart post-operatively - ok with gen surgery. - Doppler studies negative for DVT.  No clinical suspicion for recurrent PE.   Old records were reviewed especially Care Everywhere.  Looks like his PE occurred in September of 2020 in the setting of hospitalization.  He was continued for being on 6 months due to poor mobility.   Acute respiratory failure with hypoxia/obstructive sleep apnea Does not usually use oxygen at home.  Apparently has been requiring 4 L here in the hospital.  Was weaned off of oxygen yesterday.  Noted to be on CPAP this morning.  Pulmonary examination is unremarkable for this morning.  History of asthma Stable.  No wheezing heard.  Chronic diastolic CHF Echocardiogram showed diastolic dysfunction.  He does have a pedal edema.  Otherwise does not have any respiratory issues currently.  No pulmonary edema noted on chest x-ray.  Not noted to be on any diuretics. Seems to be stable.  Hyponatremia/hypokalemia/hypophosphatemia Sodium level stable.  Potassium is better.  Phosphorus is better as well.  Will give additional dose.  Diabetes mellitus type 2 uncontrolled with hyperglycemia HbA1c 7.9.  Continue SSI.  Essential hypertension Monitor blood pressures closely.  Noted to be on amlodipine hydralazine.  Acute kidney injury on chronic kidney disease stage IIIa Creatinine peaked at 1.81.  Improved and seems to be back to baseline.  Monitor urine output.  UTI with yeast 40,000 colonies noted on culture.  Patient was started on fluconazole.  QT prolongation Avoid QT prolonging medications.  Physical deconditioning Patient mentions that he does not ambulate much at home due to history of peripheral neuropathy.  He also had severe physical deconditioning when he had COVID-19.  Obesity Estimated body mass index is 37.87 kg/m as calculated from the following:   Height as of this encounter: _0  (1.854 m).   Weight as of this encounter: 130.2 kg.    DVT Prophylaxis: SCDs Code Status:  Full code Family Communication: Discussed with the patient and his wife Disposition Plan:  Status is:  Inpatient  Remains inpatient appropriate because:Ongoing active pain requiring inpatient pain management, IV treatments appropriate due to intensity of illness or inability to take PO and Inpatient level of care appropriate due to severity of illness   Dispo: The patient is from: Home              Anticipated d/c is to: Home              Patient currently is not medically stable to d/c.   Difficult to place patient No    Consultants: Gastroenterology.  General surgery  Procedures:   4/1 echocardiogram;Left Ventricle: LVEF= 60 to 65%.  -Grade II diastolic dysfunction (pseudonormalization).  4/2 ERCP;Choledocholithiasis s/p biliary sphincterotomy  with balloon extraction. - Ascending cholangitis - Periampullary diverticula. - Cholelithiasis with patent cystic duct. - Biopsy was performed  4/5 Lap Chole, liver biopsy, umbilical hernia repair    Medications:  Scheduled: . amLODipine  10 mg Oral Daily  . chlorhexidine  15 mL Mouth Rinse BID  . Chlorhexidine Gluconate Cloth  6 each Topical Daily  . fluconazole  100 mg Oral Daily  . hydrALAZINE  25 mg Oral Q6H  . insulin aspart  0-20 Units Subcutaneous TID WC  . insulin aspart  0-5 Units Subcutaneous QHS  . latanoprost  1 drop Left Eye QHS  . mouth rinse  15 mL Mouth Rinse q12n4p   Continuous: . cefTRIAXone (ROCEPHIN)  IV 0 g (12/16/20 1053)  . methocarbamol (ROBAXIN) IV    . metronidazole 500 mg (12/18/20 0008)   KFM:MCRFVOHKGOVPC **OR** acetaminophen, albuterol, methocarbamol (ROBAXIN) IV, metoprolol tartrate, morphine injection, oxyCODONE, prochlorperazine, traMADol   Objective:  Vital Signs  Vitals:   12/17/20 2158 12/18/20 0215 12/18/20 0500 12/18/20 0601  BP: 127/66 125/62  118/62  Pulse: 76 68  70  Resp: _0 Temp: 97.8 F (36.6 C) 98.2 F (36.8 C)  (!) 97.5 F (36.4 C)   TempSrc: Oral Oral  Oral  SpO2: 94% 95%  97%  Weight:   130.2 kg   Height:        Intake/Output Summary (Last 24 hours) at 12/18/2020 0804 Last data filed at 12/18/2020 0541 Gross per 24 hour  Intake 708.15 ml  Output 1690 ml  Net -981.85 ml   Filed Weights   12/17/20 0500 12/17/20 0954 12/18/20 0500  Weight: 131 kg 131 kg 130.2 kg    General appearance: Awake alert.  In no distress Resp: Clear to auscultation bilaterally.  Normal effort Cardio: S1-S2 is normal regular.  No S3-S4.  No rubs murmurs or bruit GI: Abdomen is soft.  Nontender nondistended.  Bowel sounds are present normal.  No masses organomegaly Extremities: Mild edema bilateral lower extremities Neurologic: Alert and oriented x3.  No focal neurological deficits.     Lab Results:  Data Reviewed: I have personally reviewed following labs and imaging studies  CBC: Recent Labs  Lab 12/14/20 0603 12/15/20 0233 12/16/20 0349 12/17/20 0454 12/18/20 0355  WBC 6.1 7.7 7.3 7.5 7.9  NEUTROABS 4.6 5.3 4.8 4.5 5.5  HGB 12.4* 12.2* 12.0* 12.2* 12.5*  HCT 38.8* 39.3 37.6* 38.5* 41.0  MCV 99.5 101.8* 98.9 99.2 101.5*  PLT 138* 137* 165 191 340    Basic Metabolic Panel: Recent Labs  Lab 12/14/20 0603 12/15/20 0233 12/16/20 0349 12/17/20 0454 12/18/20 0355  NA 135 132* 132* 135 134*  K 3.9 4.9 3.3*  3.8 5.8*  CL 103 101 102 103 99  CO2 19* 19* 21* 20* 20*  GLUCOSE 150* 155* 149* 183* 266*  BUN 27* 24* _0 CREATININE 1.34* 1.08 0.77 0.84 1.01  CALCIUM 8.9 8.8* 8.8* 9.2 9.5  MG 2.0 2.0 1.7 2.0 1.9  PHOS 2.1* 1.9* 1.2* 2.0* 3.3    GFR: Estimated Creatinine Clearance: 94.9 mL/min (by C-G formula based on SCr of 1.01 mg/dL).  Liver Function Tests: Recent Labs  Lab 12/14/20 0603 12/15/20 0233 12/16/20 0349 12/17/20 0454 12/18/20 0355  AST 132* 182* 130* 78* 42*  ALT 127* 137* 121* 95* 72*  ALKPHOS 66 90 107 124 121  BILITOT 5.0* 6.1* 4.8* 3.4* 2.6*  PROT 5.9* 5.8* 5.6* 5.9* 6.2*  ALBUMIN  2.9* 2.7* 2.3* 2.4* 2.5*    Recent Labs  Lab 12/12/20 1940  LIPASE 25    Coagulation Profile: Recent Labs  Lab 12/12/20 1940 12/13/20 0258  INR 1.2 1.4*     CBG: Recent Labs  Lab 12/17/20 1244 12/17/20 1751 12/17/20 2120 12/18/20 0450 12/18/20 0744  GLUCAP 202* 235* 300* 300* 265*    Anemia Panel: No results for input(s): VITAMINB12, FOLATE, FERRITIN, TIBC, IRON, RETICCTPCT in the last 72 hours.  Recent Results (from the past 240 hour(s))  Culture, blood (Routine x 2)     Status: Abnormal   Collection Time: 12/12/20  7:45 PM   Specimen: BLOOD  Result Value Ref Range Status   Specimen Description   Final    BLOOD RIGHT ANTECUBITAL Performed at [facility], [address] [place], [place] [address]    Special Requests   Final    BOTTLES DRAWN AEROBIC AND ANAEROBIC Blood Culture adequate volume Performed at [facility], [address] [place], [place] [address]    Culture  Setup Time   Final    GRAM NEGATIVE RODS IN BOTH AEROBIC AND ANAEROBIC BOTTLES CRITICAL RESULT CALLED TO, READ BACK BY AND VERIFIED WITHGuadlupe Spanish @ 8938 12/13/20 EB Performed at [facility], [place] [address] [place], [place] [address]    Culture KLEBSIELLA PNEUMONIAE (A)  Final   Report Status 12/15/2020 FINAL  Final   Organism ID, Bacteria KLEBSIELLA PNEUMONIAE  Final      Susceptibility   Klebsiella pneumoniae - MIC*    AMPICILLIN >=32 RESISTANT Resistant     CEFAZOLIN <=4 SENSITIVE Sensitive     CEFEPIME <=0.12 SENSITIVE Sensitive     CEFTAZIDIME <=1 SENSITIVE Sensitive     CEFTRIAXONE <=0.25 SENSITIVE Sensitive     CIPROFLOXACIN <=0.25 SENSITIVE Sensitive     GENTAMICIN <=1 SENSITIVE Sensitive     IMIPENEM <=0.25 SENSITIVE Sensitive     TRIMETH/SULFA <=20 SENSITIVE Sensitive     AMPICILLIN/SULBACTAM 8 SENSITIVE Sensitive     PIP/TAZO <=4 SENSITIVE Sensitive     * KLEBSIELLA PNEUMONIAE  Blood Culture ID Panel (Reflexed)     Status: Abnormal    Collection Time: 12/12/20  7:45 PM  Result Value Ref Range Status   Enterococcus faecalis NOT DETECTED NOT DETECTED Final   Enterococcus Faecium NOT DETECTED NOT DETECTED Final   Listeria monocytogenes NOT DETECTED NOT DETECTED Final   Staphylococcus species NOT DETECTED NOT DETECTED Final   Staphylococcus aureus (BCID) NOT DETECTED NOT DETECTED Final   Staphylococcus epidermidis NOT DETECTED NOT DETECTED Final   Staphylococcus lugdunensis NOT DETECTED NOT DETECTED Final   Streptococcus species NOT DETECTED NOT DETECTED Final   Streptococcus agalactiae NOT DETECTED  NOT DETECTED Final   Streptococcus pneumoniae NOT DETECTED NOT DETECTED Final   Streptococcus pyogenes NOT DETECTED NOT DETECTED Final   A.calcoaceticus-baumannii NOT DETECTED NOT DETECTED Final   Bacteroides fragilis NOT DETECTED NOT DETECTED Final   Enterobacterales DETECTED (A) NOT DETECTED Final    Comment: Enterobacterales represent a large order of gram negative bacteria, not a single organism. CRITICAL RESULT CALLED TO, READ BACK BY AND VERIFIED WITH: N GLOGOVAC @ 4818 12/13/20 EB    Enterobacter cloacae complex NOT DETECTED NOT DETECTED Final   Escherichia coli NOT DETECTED NOT DETECTED Final   Klebsiella aerogenes NOT DETECTED NOT DETECTED Final   Klebsiella oxytoca NOT DETECTED NOT DETECTED Final   Klebsiella pneumoniae DETECTED (A) NOT DETECTED Final    Comment: CRITICAL RESULT CALLED TO, READ BACK BY AND VERIFIED WITH: N GLOGOVAC @ 1153 12/13/20 EB    Proteus species NOT DETECTED NOT DETECTED Final   Salmonella species NOT DETECTED NOT DETECTED Final   Serratia marcescens NOT DETECTED NOT DETECTED Final   Haemophilus influenzae NOT DETECTED NOT DETECTED Final   Neisseria meningitidis NOT DETECTED NOT DETECTED Final   Pseudomonas aeruginosa NOT DETECTED NOT DETECTED Final   Stenotrophomonas maltophilia NOT DETECTED NOT DETECTED Final   Candida albicans NOT DETECTED NOT DETECTED Final   Candida auris NOT DETECTED  NOT DETECTED Final   Candida glabrata NOT DETECTED NOT DETECTED Final   Candida krusei NOT DETECTED NOT DETECTED Final   Candida parapsilosis NOT DETECTED NOT DETECTED Final   Candida tropicalis NOT DETECTED NOT DETECTED Final   Cryptococcus neoformans/gattii NOT DETECTED NOT DETECTED Final   CTX-M ESBL NOT DETECTED NOT DETECTED Final   Carbapenem resistance IMP NOT DETECTED NOT DETECTED Final   Carbapenem resistance KPC NOT DETECTED NOT DETECTED Final   Carbapenem resistance NDM NOT DETECTED NOT DETECTED Final   Carbapenem resist OXA 48 LIKE NOT DETECTED NOT DETECTED Final   Carbapenem resistance VIM NOT DETECTED NOT DETECTED Final    Comment: Performed at [facility], [address], [place], [place] [address]  Resp Panel by RT-PCR (Flu A&B, Covid) Nasopharyngeal Swab     Status: None   Collection Time: 12/12/20  8:15 PM   Specimen: Nasopharyngeal Swab; Nasopharyngeal(NP) swabs in vial transport medium  Result Value Ref Range Status   SARS Coronavirus 2 by RT PCR NEGATIVE NEGATIVE Final    Comment: (NOTE) SARS-CoV-2 target nucleic acids are NOT DETECTED.  The SARS-CoV-2 RNA is generally detectable in upper respiratory specimens during the acute phase of infection. The lowest concentration of SARS-CoV-2 viral copies this assay can detect is 138 copies/mL. A negative result does not preclude SARS-Cov-2 infection and should not be used as the sole basis for treatment or other patient management decisions. A negative result may occur with  improper specimen collection/handling, submission of specimen other than nasopharyngeal swab, presence of viral mutation(s) within the areas targeted by this assay, and inadequate number of viral copies(<138 copies/mL). A negative result must be combined with clinical observations, patient history, and epidemiological information. The expected result is Negative.  Fact Sheet for Patients:  EntrepreneurPulse.com.au  Fact  Sheet for Healthcare Providers:  IncredibleEmployment.be  This test is no t yet approved or cleared by the [place] FDA and  has been authorized for detection and/or diagnosis of SARS-CoV-2 by FDA under an Emergency Use Authorization (EUA). This EUA will remain  in effect (meaning this test can be used) for the duration of the COVID-19 declaration under Section 564(b)(1) of the  Act, 21 U.S.C.section 360bbb-3(b)(1), unless the authorization is terminated  or revoked sooner.       Influenza A by PCR NEGATIVE NEGATIVE Final   Influenza B by PCR NEGATIVE NEGATIVE Final    Comment: (NOTE) The Xpert Xpress SARS-CoV-2/FLU/RSV plus assay is intended as an aid in the diagnosis of influenza from Nasopharyngeal swab specimens and should not be used as a sole basis for treatment. Nasal washings and aspirates are unacceptable for Xpert Xpress SARS-CoV-2/FLU/RSV testing.  Fact Sheet for Patients: EntrepreneurPulse.com.au  Fact Sheet for Healthcare Providers: IncredibleEmployment.be  This test is not yet approved or cleared by the [place] FDA and has been authorized for detection and/or diagnosis of SARS-CoV-2 by FDA under an Emergency Use Authorization (EUA). This EUA will remain in effect (meaning this test can be used) for the duration of the COVID-19 declaration under Section 564(b)(1) of the Act, 21 U.S.C. section 360bbb-3(b)(1), unless the authorization is terminated or revoked.  Performed at [facility], [address] [place], [place] [address]   Culture, blood (Routine x 2)     Status: Abnormal   Collection Time: 12/12/20  9:20 PM   Specimen: BLOOD  Result Value Ref Range Status   Specimen Description   Final    BLOOD Blood Culture adequate volume Performed at [facility], [address] [place], [place] [address]    Special Requests   Final    BOTTLES DRAWN AEROBIC AND ANAEROBIC LEFT  ANTECUBITAL Performed at [facility], [address] [place], [place] [address]    Culture  Setup Time   Final    GRAM NEGATIVE RODS IN BOTH AEROBIC AND ANAEROBIC BOTTLES CRITICAL VALUE NOTED.  VALUE IS CONSISTENT WITH PREVIOUSLY REPORTED AND CALLED VALUE.    Culture (A)  Final    KLEBSIELLA PNEUMONIAE SUSCEPTIBILITIES PERFORMED ON PREVIOUS CULTURE WITHIN THE LAST 5 DAYS. Performed at [facility], [place] [address], [place], [place] [address]    Report Status 12/15/2020 FINAL  Final  MRSA PCR Screening     Status: None   Collection Time: 12/13/20  1:29 AM   Specimen: Nasopharyngeal  Result Value Ref Range Status   MRSA by PCR NEGATIVE NEGATIVE Final    Comment:        The GeneXpert MRSA Assay (FDA approved for NASAL specimens only), is one component of a comprehensive MRSA colonization surveillance program. It is not intended to diagnose MRSA infection nor to guide or monitor treatment for MRSA infections. Performed at [facility], [place] [address] [place], [place] [address]   Urine Culture     Status: Abnormal   Collection Time: 12/14/20 12:39 AM   Specimen: Urine, Random  Result Value Ref Range Status   Specimen Description   Final    URINE, RANDOM Performed at [facility] [address], [place], [place] [address]    Special Requests   Final    NONE Performed at [facility], [place] [address], [place], [place] [address]    Culture 40,000 COLONIES/mL YEAST (A)  Final   Report Status 12/15/2020 FINAL  Final      Radiology Studies: No results found.     LOS: 5 days   Little Ishikawa  [facility] Pager on [email or web address]  12/18/2020, 8:04 AM

## 2020-12-18 NOTE — Care Management Important Message (Signed)
Important Message  Patient Details IM Letter given to the Patient. Name: Randy Liu MRN: [id number] Date of Birth: 17-Apr-1951   Medicare Important Message Given:  Yes     Caren Macadam 12/18/2020, 4:21 PM

## 2020-12-18 NOTE — Evaluation (Signed)
Occupational Therapy Evaluation Patient Details Name: Randy Liu MRN: [id number] DOB: 1951/03/17 Today's Date: 12/18/2020    History of Present Illness Patient is a 70 year old male PMHx Asthma, COVID,  insulin-dependent type 2 diabetes, DM neuropathy, HTN, HLD,CKD stage III followed by nephrology, OSA on CPAP, history of PE on Eliquis, GERD, IBS.  Presented with abdominal pain nausea and vomiting. S/p ERCP 4/2, s/p Laparoscopic cholecystectomy, laparoscopic liver biopsy, primary repair umbilical hernia on 4/5   Clinical Impression   Patient lives with spouse in a single level home with ramp entry. Spouse reports since patient's COVID diagnosis in Dec 2020 his strength and mobility has been limited. Spouse was assisting patient with slide board transfers up until ~1 week ago due to abdominal pain. Since this time patient requiring assist from spouse for dressing, bathing, toileting at bed level. Currently patient set up to mod A for UB ADL and total A for toileting and LB ADLs. Patient needed max A x2 to sit upright to edge of bed. Able to tolerate brief periods of fair sitting balance, note posterior lean and fatigue relying more heavily on at least unilateral UE support as time progresses. Patient tolerate sitting up edge of bed ~8 mins before returning to supine. At this point would recommend ST rehab prior to D/C as spouse states she needs him to be able to assist her with slide board transfers. Pending patient progress acutely may be able to D/C home with [facility]. Will continue to follow.    Follow Up Recommendations  SNF;Other (comment) (vs HH pending patient improvement with transfers)    Equipment Recommendations  None recommended by OT       Precautions / Restrictions Precautions Precautions: Fall Precaution Comments: abd sx Restrictions Weight Bearing Restrictions: No      Mobility Bed Mobility Overal bed mobility: Needs Assistance Bed Mobility: Rolling;Sidelying to Sit;Sit  to Sidelying Rolling: Min assist Sidelying to sit: Max assist;+2 for physical assistance;HOB elevated     Sit to sidelying: Mod assist General bed mobility comments: cue patient in log roll technique to minimize abdominal strain. patient needing assist to bring LEs to edge of bed as well as heavy assist for trunk support to sitting. patient tolerate sitting up EOB ~8 minutes, able to assist with lowering trunk to sidelying and mod A to lift LEs onto bed. Patient able to bend knees and reach overhead for bed frame to slide himself up to head of bed in supine    Transfers                 General transfer comment: deferred, fair to poor sitting balance, fatiguing quickly    Balance Overall balance assessment: Needs assistance Sitting-balance support: Feet supported;Single extremity supported Sitting balance-Leahy Scale: Poor Sitting balance - Comments: at times can tolerate without UE support however in short increments, primarily reliant on at least unilateral support Postural control: Posterior lean     Standing balance comment: unable even at baseline has been slide board transfers                           ADL either performed or assessed with clinical judgement   ADL Overall ADL's : Needs assistance/impaired Eating/Feeding: Set up;Bed level   Grooming: Set up;Bed level   Upper Body Bathing: Minimal assistance;Bed level;Sitting   Lower Body Bathing: Total assistance;Bed level   Upper Body Dressing : Moderate assistance;Bed level;Sitting   Lower Body Dressing: Total assistance;Bed level  Lower Body Dressing Details (indicate cue type and reason): to don socks, spouse does assist with this at baseline   Toilet Transfer Details (indicate cue type and reason): deferred, patient fatigues with sitting edge of bed after ~8 minutes and relying on UE support. patient slide board transfers at baseline with spouse assist Toileting- Clothing Manipulation and Hygiene:  Total assistance;Bed level         General ADL Comments: patient requiring increased assistance for self care tasks due to weakness, decreased activity tolerance and abdominal pain                  Pertinent Vitals/Pain Pain Assessment: Faces Faces Pain Scale: Hurts little more Pain Location: abdomen Pain Descriptors / Indicators: Sore Pain Intervention(s): Monitored during session     Hand Dominance Right   Extremity/Trunk Assessment Upper Extremity Assessment Upper Extremity Assessment: Overall WFL for tasks assessed   Lower Extremity Assessment Lower Extremity Assessment: Defer to PT evaluation       Communication Communication Communication: HOH   Cognition Arousal/Alertness: Awake/alert Behavior During Therapy: WFL for tasks assessed/performed Overall Cognitive Status: Within Functional Limits for tasks assessed                                     General Comments  O2 checked multiple times throughout session even once returned to bed on room air, maintaining 93-97%    Exercises Exercises: Other exercises Other Exercises Other Exercises: x10 crossing body midline/functional reach to work on weight shifting and balance in preparation for slide board transfers        Home Living Family/patient expects to be discharged to:: Private residence Living Arrangements: Spouse/significant other Available Help at Discharge: Family;Available PRN/intermittently Type of Home: House Home Access: Ramped entrance     Home Layout: One level     Bathroom Shower/Tub: Producer, television/film/video: Handicapped height Bathroom Accessibility: Yes How Accessible: Accessible via walker Home Equipment: Wheelchair - [employer] - 2 wheels;Bedside commode;Tub bench;Grab bars - tub/shower;Grab bars - toilet;Hand held shower head;Hospital bed (slide board, lift chair)          Prior Functioning/Environment Level of Independence: Needs assistance   Gait / Transfers Assistance Needed: slide board transfer to w/c with assist from spouse ADL's / Homemaking Assistance Needed: spouse assisting with dressing, bathing, toileting at bed level for past year            OT Problem List: Decreased activity tolerance;Impaired balance (sitting and/or standing);Decreased safety awareness;Pain;Obesity      OT Treatment/Interventions: Self-care/ADL training;Therapeutic exercise;Therapeutic activities;Patient/family education;Balance training    OT Goals(Current goals can be found in the care plan section) Acute Rehab OT Goals Patient Stated Goal: get stronger to assist spouse with mobility OT Goal Formulation: With patient/family Time For Goal Achievement: 01/01/21 Potential to Achieve Goals: Good  OT Frequency: Min 2X/week    AM-PAC OT "6 Clicks" Daily Activity     Outcome Measure Help from another person eating meals?: A Little Help from another person taking care of personal grooming?: A Little Help from another person toileting, which includes using toliet, bedpan, or urinal?: Total Help from another person bathing (including washing, rinsing, drying)?: A Lot Help from another person to put on and taking off regular upper body clothing?: A Lot Help from another person to put on and taking off regular lower body clothing?: Total 6 Click Score: 12   End of  Session Nurse Communication: Mobility status  Activity Tolerance: Patient tolerated treatment well;Patient limited by fatigue Patient left: in bed;with call bell/phone within reach;with bed alarm set;with family/visitor present  OT Visit Diagnosis: Other abnormalities of gait and mobility (R26.89);Muscle weakness (generalized) (M62.81);Pain Pain - part of body:  (abdomen)                Time: 8891-6945 OT Time Calculation (min): 44 min Charges:  OT General Charges $OT Visit: 1 Visit OT Evaluation $OT Eval Moderate Complexity: 1 Mod OT Treatments $Self Care/Home Management :  23-37 mins  Marlyce Huge OT OT pager: [phone]  Carmelia Roller 12/18/2020, 11:13 AM

## 2020-12-18 NOTE — Evaluation (Signed)
Physical Therapy Evaluation Patient Details Name: Tyvion Edmondson MRN: [id number] DOB: 14-Sep-1951 Today's Date: 12/18/2020   History of Present Illness  Pt is a 70 yo male presents with abdominal pain, nausea, vomiting. 4/2 ERCP, 4/5 Laparoscopic cholecystectomy, laparoscopic liver biopsy, and primary repair umbilical hernia. PMH: asthma, COVID Dec 2020, diabetes, DM neuropathy, HTN, HLD, CKD stage III, OSA on CPAP, history of PE, GERD, IBS.  Clinical Impression  Pt admitted with above diagnosis. Per pt and pt's spouse, pt hospitalized with covid in Dec 2020, completed therapy until Sept 2021 and was performing slideboard transfers and stand-pivot car transfers, then fell week of Thanksgiving 2021 and since has only performed slideboard transfers with spouse assisting bed<>w/c and wc<>recliner. Spouse is completing pt's bathing and toileting at bedlevel and pt is using w/c requiring spouse assist to propel and maneuver. Pt currently requiring mod A +2 for bed mobility, heavy verbal cues for technique and sequencing. Pt with poor sitting balance, initially requiring max A to maintain upright sitting, progressing to min G with BUE holding pt in sitting, after ~10 minutes requiring assist back to supine due to fatigue. Pt performs slide/scoot lateral transfer up to [facility] with max A +2 and heavy verbal cues for technique and sequencing. Pt's spouse in room, very motivated and realistic wanting pt to bed able to perform slideboard w/c transfers. Spouse reports no other family and unable to afford 24 hr assist, pt with previous successful SNF stays in 2020 and 2021 progressing back to baseline of slideboard transfers. Pt well equipped at home, with the exception of hoyer lift. Notified nursing of hoyer lift use to transfer pt to bedside chair as tolerated. Pt currently with functional limitations due to the deficits listed below (see PT Problem List). Pt will benefit from skilled PT to increase their independence  and safety with mobility to allow discharge to the venue listed below.       Follow Up Recommendations SNF (vs HHPT pending progress)    Equipment Recommendations  Other (comment) (possible Hoyer lift if pt's spouse takes him home)    Recommendations for Other Services       Precautions / Restrictions Precautions Precautions: Fall Precaution Comments: abd surgery Restrictions Weight Bearing Restrictions: No      Mobility  Bed Mobility Overal bed mobility: Needs Assistance Bed Mobility: Rolling;Sidelying to Sit;Sit to Sidelying Rolling: Min assist Sidelying to sit: Mod assist;+2 for physical assistance;+2 for safety/equipment;HOB elevated   Sit to sidelying: Mod assist General bed mobility comments: verbal/tactile cues for log rolling, educated on avoiding val salva maneuver, able to inch BLE over to EOB requiring mod A to clear EOB, mod A to upright trunk into sitting with HOB elevated; initial heavy posterior lean requiring max A to maintain upright sitting, progresses to min G for safety, tolerates seated EOB for ~10 minutes with verbal cues to improve sitting posture; mod A to lift BLE back into bed, uses headboard to assist in scooting up in bed with assist from therapist    Transfers Overall transfer level: Needs assistance  Transfers: Lateral/Scoot Transfers   Lateral/Scoot Transfers: Max assist;+2 physical assistance General transfer comment: max A +2 wth use of bed pad to slide up to [facility], verbal/tactile cues to use BUE to assist, forward lean to decrease weight on bottom, significant increased time with rest breaks due to fatigue  Ambulation/Gait  General Gait Details: nonambulatory at baseline  Careers information officer  Modified Rankin (Stroke Patients Only)       Balance Overall balance assessment: Needs assistance Sitting-balance support: Feet supported;Bilateral upper extremity supported Sitting balance-Leahy Scale: Poor Sitting  balance - Comments: max A initially due to posterior LOB, progressing to min G for safety Postural control: Posterior lean  Standing balance comment: slideboard transfers at baseline            Pertinent Vitals/Pain Pain Assessment: Faces Faces Pain Scale: Hurts little more Pain Location: abdomen Pain Descriptors / Indicators: Grimacing;Sore Pain Intervention(s): Limited activity within patient's tolerance;Monitored during session    Home Living Family/patient expects to be discharged to:: Private residence Living Arrangements: Spouse/significant other Available Help at Discharge: Family;Available PRN/intermittently Type of Home: House Home Access: Ramped entrance     Home Layout: One level Home Equipment: Wheelchair - [employer] - 2 wheels;Bedside commode;Tub bench;Grab bars - tub/shower;Grab bars - toilet;Hand held shower head;Hospital bed (slide board, lift chair)      Prior Function Level of Independence: Needs assistance   Gait / Transfers Assistance Needed: slide board transfer to w/c with assist from spouse, sleeping in hospital bed, rent van for doctor's appointments due to inability to car transfer  ADL's / Homemaking Assistance Needed: spouse assisting with dressing, bathing, toileting at bed level for past year        Hand Dominance   Dominant Hand: Right    Extremity/Trunk Assessment   Upper Extremity Assessment Upper Extremity Assessment: Defer to OT evaluation    Lower Extremity Assessment Lower Extremity Assessment: Generalized weakness;RLE deficits/detail;LLE deficits/detail RLE Deficits / Details: AROM ~25%, strength 2-/5 throughout RLE Sensation: decreased light touch;history of peripheral neuropathy LLE Deficits / Details: AROM ~25%, strength 2-/5 throughout LLE Sensation: decreased light touch;history of peripheral neuropathy    Cervical / Trunk Assessment Cervical / Trunk Assessment: Normal  Communication   Communication: HOH (use  hearing aids)  Cognition Arousal/Alertness: Awake/alert Behavior During Therapy: WFL for tasks assessed/performed Overall Cognitive Status: Within Functional Limits for tasks assessed  General Comments: HOH, use hearing aids, R ear better      General Comments General comments (skin integrity, edema, etc.): on RA with SpO2 90-93% with mobility    Exercises     Assessment/Plan    PT Assessment Patient needs continued PT services  PT Problem List Decreased strength;Decreased range of motion;Decreased activity tolerance;Decreased balance;Decreased mobility;Decreased coordination;Decreased knowledge of use of DME;Cardiopulmonary status limiting activity;Impaired sensation;Obesity;Pain       PT Treatment Interventions DME instruction;Gait training;Functional mobility training;Therapeutic activities;Therapeutic exercise;Balance training;Neuromuscular re-education;Patient/family education    PT Goals (Current goals can be found in the Care Plan section)  Acute Rehab PT Goals Patient Stated Goal: SNF vs home, slideboard transfer to w/c PT Goal Formulation: With patient/family Time For Goal Achievement: 01/01/21 Potential to Achieve Goals: Fair    Frequency Min 2X/week   Barriers to discharge        Co-evaluation               AM-PAC PT "6 Clicks" Mobility  Outcome Measure Help needed turning from your back to your side while in a flat bed without using bedrails?: Total Help needed moving from lying on your back to sitting on the side of a flat bed without using bedrails?: Total Help needed moving to and from a bed to a chair (including a wheelchair)?: Total Help needed standing up from a chair using your arms (e.g., wheelchair or bedside chair)?: Total Help needed to walk in hospital room?: Total Help needed climbing 3-5  steps with a railing? : Total 6 Click Score: 6    End of Session   Activity Tolerance: Patient limited by fatigue;Patient tolerated treatment  well Patient left: in bed;with call bell/phone within reach;with bed alarm set;with family/visitor present Nurse Communication: Mobility status;Need for lift equipment (hoyer OOB) PT Visit Diagnosis: Other abnormalities of gait and mobility (R26.89);Muscle weakness (generalized) (M62.81)    Time: 7290-2111 PT Time Calculation (min) (ACUTE ONLY): 40 min   Charges:   PT Evaluation $PT Eval Moderate Complexity: 1 Mod PT Treatments $Therapeutic Activity: 8-22 mins         Tori Belford Pascucci PT, DPT 12/18/20, 3:52 PM

## 2020-12-18 NOTE — Plan of Care (Signed)

## 2020-12-18 NOTE — Progress Notes (Signed)
[facility] Progress Note  1 Day Post-Op  Subjective: CC-  Abdomen a little sore but he denies pain. Has not taken any pain medication. No n/v. Tolerating liquids.   Objective: Vital signs in last 24 hours: Temp:  [92.8 F (33.8 C)-98.5 F (36.9 C)] 97.5 F (36.4 C) (04/06 0601) Pulse Rate:  [68-86] 70 (04/06 0601) Resp:  [14-26] 18 (04/06 0601) BP: (118-169)/(62-82) 118/62 (04/06 0601) SpO2:  [93 %-98 %] 97 % (04/06 0601) Weight:  [130.2 kg-131 kg] 130.2 kg (04/06 0500) Last BM Date: 12/14/20  Intake/Output from previous day: 04/05 0701 - 04/06 0700 In: 708.2 [P.O.:200; I.V.:50; IV Piggyback:458.2] Out: 1690 [Urine:1670; Blood:20] Intake/Output this shift: No intake/output data recorded.  PE: Gen:  Alert, NAD, pleasant Pulm: rate and effort normal Abd: obese, soft, mild tenderness over incisions, +BS, lap incisions C/D/I without erythema or drainage Skin: no rashes noted, warm and dry  Lab Results:  Recent Labs    12/17/20 0454 12/18/20 0355  WBC 7.5 7.9  HGB 12.2* 12.5*  HCT 38.5* 41.0  PLT 191 205   BMET Recent Labs    12/17/20 0454 12/18/20 0355  NA 135 134*  K 3.8 5.8*  CL 103 99  CO2 20* 20*  GLUCOSE 183* 266*  BUN 11 21  CREATININE 0.84 1.01  CALCIUM 9.2 9.5   PT/INR No results for input(s): LABPROT, INR in the last 72 hours. CMP     Component Value Date/Time   NA 134 (L) 12/18/2020 0355   K 5.8 (H) 12/18/2020 0355   CL 99 12/18/2020 0355   CO2 20 (L) 12/18/2020 0355   GLUCOSE 266 (H) 12/18/2020 0355   BUN 21 12/18/2020 0355   CREATININE 1.01 12/18/2020 0355   CALCIUM 9.5 12/18/2020 0355   PROT 6.2 (L) 12/18/2020 0355   ALBUMIN 2.5 (L) 12/18/2020 0355   AST 42 (H) 12/18/2020 0355   ALT 72 (H) 12/18/2020 0355   ALKPHOS 121 12/18/2020 0355   BILITOT 2.6 (H) 12/18/2020 0355   GFRNONAA >60 12/18/2020 0355   Lipase     Component Value Date/Time   LIPASE 25 12/12/2020 1940       Studies/Results: No results  found.  Anti-infectives: Anti-infectives (From admission, onward)   Start     Dose/Rate Route Frequency Ordered Stop   12/16/20 1100  fluconazole (DIFLUCAN) tablet 100 mg        100 mg Oral Daily 12/16/20 1005     12/16/20 1000  fluconazole (DIFLUCAN) IVPB 100 mg  Status:  Discontinued        100 mg 50 mL/hr over 60 Minutes Intravenous Every 24 hours 12/15/20 1427 12/16/20 1005   12/15/20 1530  fluconazole (DIFLUCAN) IVPB 200 mg        200 mg 100 mL/hr over 60 Minutes Intravenous  Once 12/15/20 1426 12/15/20 1701   12/15/20 0900  cefTRIAXone (ROCEPHIN) 2 g in sodium chloride 0.9 % 100 mL IVPB        2 g 200 mL/hr over 30 Minutes Intravenous Every 24 hours 12/15/20 0814     12/13/20 2200  vancomycin (VANCOREADY) IVPB 1500 mg/300 mL  Status:  Discontinued        1,500 mg 150 mL/hr over 120 Minutes Intravenous Every 24 hours 12/12/20 2054 12/13/20 1003   12/13/20 2200  vancomycin (VANCOREADY) IVPB 1250 mg/250 mL  Status:  Discontinued        1,250 mg 166.7 mL/hr over 90 Minutes Intravenous Every 24 hours 12/13/20 1003 12/13/20 1256  12/13/20 0800  metroNIDAZOLE (FLAGYL) IVPB 500 mg        500 mg 100 mL/hr over 60 Minutes Intravenous Every 8 hours 12/13/20 0241     12/12/20 2245  metroNIDAZOLE (FLAGYL) IVPB 500 mg        500 mg 100 mL/hr over 60 Minutes Intravenous  Once 12/12/20 2235 12/13/20 0108   12/12/20 2200  ceFEPIme (MAXIPIME) 2 g in sodium chloride 0.9 % 100 mL IVPB  Status:  Discontinued        2 g 200 mL/hr over 30 Minutes Intravenous Every 12 hours 12/12/20 2024 12/12/20 2033   12/12/20 2130  vancomycin (VANCOCIN) IVPB 1000 mg/200 mL premix       "Followed by" Linked Group Details   1,000 mg 200 mL/hr over 60 Minutes Intravenous  Once 12/12/20 2024 12/12/20 2348   12/12/20 2045  ceFEPIme (MAXIPIME) 2 g in sodium chloride 0.9 % 100 mL IVPB  Status:  Discontinued        2 g 200 mL/hr over 30 Minutes Intravenous Every 12 hours 12/12/20 2033 12/15/20 0814   12/12/20 2030   vancomycin (VANCOCIN) IVPB 1000 mg/200 mL premix       "Followed by" Linked Group Details   1,000 mg 200 mL/hr over 60 Minutes Intravenous  Once 12/12/20 2024 12/12/20 2225       Assessment/Plan Cholangitis -s/p successful ERCP 4/2 withsphincterotomy and removal of 3 stones. GI recommendsliver biopsy at the time of cholecystectomy to rule out underlying cirrhosis - LFTs are trending down  S/p laparoscopic cholecystectomy, liver biopsy, primary repair umbilical hernia 4/5 Dr. Gerrit Friends - POD#1 - Liver biopsy pending - Doing well from surgery. Advance to carb mod diet. Reordered home metoclopramide for motility. PT/OT today. He does not need any more antibiotics from surgical standpoint (abx per TRH for bacteremia). Ok to restart eliquis this evening. Discharge instructions and follow up info on AVS.  HTN DM OSA - CPAP Asthma - uses inhaler PRN, not on home O2.  Acute respiratory failure with hypoxia Acute diastolic CHF - EF 60-65% on ECHO 12/13/20 CKD stage III - baseline Cr 1~.48 Wheelchair bound x1 year post-covid-19 infection Obesity BMI 38.42 Hx PE on eliquis (last dose 3/31) UTI - yeast on Ucx, started diflucan Bacteremia - klebsiella pneumoniae  ID -diflucan 4/3>>, rocephin 4/3>>, flagyl 3/31>>, vancomycin 3/31>>4/1, cefepime 3/31 x1 FEN -IVF, CM diet VTE -SCDs, ok to restart eliquis this evening Foley -none Follow up -[facility] clinic   LOS: 5 days    Franne Forts, [facility] 12/18/2020, 8:10 AM Please see Amion for pager number during day hours 7:00am-4:30pm

## 2020-12-18 NOTE — Progress Notes (Signed)
Inpatient Diabetes Program Recommendations  AACE/ADA: New Consensus Statement on Inpatient Glycemic Control (2015)  Target Ranges:  Prepandial:   less than 140 mg/dL      Peak postprandial:   less than 180 mg/dL (1-2 hours)      Critically ill patients:  140 - 180 mg/dL   Lab Results  Component Value Date   GLUCAP 265 (H) 12/18/2020   HGBA1C 7.9 (H) 12/13/2020    Review of Glycemic Control  Diabetes history: DM2 Outpatient Diabetes medications: 70/30 81 units in am and 68 units in evening, Invokana 25 mg QAM Current orders for Inpatient glycemic control: Novolog 0-20 units TID with meals and 0-5 HS  HgbA1C - 7.9% Need part of basal insulin from home 70/30.  Inpatient Diabetes Program Recommendations:     Add Lantus 15 units QHS  Continue to follow.  Thank you. Ailene Ards, RD, LDN, CDE Inpatient Diabetes Coordinator [phone]

## 2020-12-19 DIAGNOSIS — K8309 Other cholangitis: Secondary | ICD-10-CM | POA: Diagnosis not present

## 2020-12-19 LAB — BASIC METABOLIC PANEL
Anion gap: 12 (ref 5–15)
BUN: 31 mg/dL — ABNORMAL HIGH (ref 8–23)
CO2: 21 mmol/L — ABNORMAL LOW (ref 22–32)
Calcium: 9.2 mg/dL (ref 8.9–10.3)
Chloride: 100 mmol/L (ref 98–111)
Creatinine, Ser: 1.04 mg/dL (ref 0.61–1.24)
GFR, Estimated: >60 mL/min (ref >60)
Glucose, Bld: 270 mg/dL — ABNORMAL HIGH (ref 70–99)
Potassium: 3.7 mmol/L (ref 3.5–5.1)
Sodium: 133 mmol/L — ABNORMAL LOW (ref 135–145)

## 2020-12-19 LAB — MAGNESIUM: Magnesium: 1.8 mg/dL (ref 1.7–2.4)

## 2020-12-19 LAB — CBC
HCT: 37.1 % — ABNORMAL LOW (ref 39.0–52.0)
Hemoglobin: 11.7 g/dL — ABNORMAL LOW (ref 13.0–17.0)
MCH: 31.8 pg (ref 26.0–34.0)
MCHC: 31.5 g/dL (ref 30.0–36.0)
MCV: 100.8 fL — ABNORMAL HIGH (ref 80.0–100.0)
Platelets: 225 10*3/uL (ref 150–400)
RBC: 3.68 MIL/uL — ABNORMAL LOW (ref 4.22–5.81)
RDW: 16.1 % — ABNORMAL HIGH (ref 11.5–15.5)
WBC: 8 10*3/uL (ref 4.0–10.5)
nRBC: 0 % (ref 0.0–0.2)

## 2020-12-19 LAB — PHOSPHORUS: Phosphorus: 2.7 mg/dL (ref 2.5–4.6)

## 2020-12-19 LAB — GLUCOSE, CAPILLARY
Glucose-Capillary: 221 mg/dL — ABNORMAL HIGH (ref 70–99)
Glucose-Capillary: 245 mg/dL — ABNORMAL HIGH (ref 70–99)
Glucose-Capillary: 352 mg/dL — ABNORMAL HIGH (ref 70–99)
Glucose-Capillary: 316 mg/dL — ABNORMAL HIGH (ref 70–99)

## 2020-12-19 NOTE — Progress Notes (Signed)
[facility] PROGRESS NOTE   Navy Belay OPF:[id number] DOB: 02/03/1951 DOA: 12/12/2020  PCP: Burman Freestone, MD  Brief History/Interval Summary: 70 y.o.WM PMHx Asthma, insulin-dependent type 2 diabetes, DM neuropathy, HTN, HLD,CKD stage III followed by nephrology, OSA on CPAP, history of PE on Eliquis, GERD, IBS.  Presented with abdominal pain nausea and vomiting.  Found to have abnormal LFTs.  He was also noted to be septic.  Concern was for cholangitis.  Patient was hospitalized.  Seen by gastroenterology.  Underwent ERCP.  General surgery is also following.  Subjective/Interval History: No acute issues or events overnight, abdomen somewhat sore but well controlled pain on current regimen.  Denies any recent bowel movement, does not feel constipated but is concerned about lack of BM over the past few days.  Otherwise denies nausea vomiting diarrhea headache fevers or chills.  Assessment/Plan:  Acute cholangitis/severe sepsis, POA -Patient met criteria for severe sepsis on admission given abnormal LFTs -CT abdomen followed by MRCP and ultrasound of the abdomen -GI and general surgery following, appreciate insight and recommendations -ERCP 12/14/20 with notable choledocholithiasis and sphincterotomy with balloon extraction -Cholecystectomy 12/17/2020 -Completed ceftriaxone and metronidazole - shortened course per ID given source control after GB removal. -LFTs downtrending appropriately  Cholelithiasis -Status post cholecystectomy 12/17/2020; tolerated well; pain well controlled. BM function returning.  Klebsiella bacteremia -Most likely secondary to above -Completed antibiotics 12/18/20  Hyperkalemia, resolved -Follow repeat labs  History of PE - Patient on Eliquis prior to admission - resumed postoperatively - ok with surgery. - Doppler studies negative for DVT.  No clinical suspicion for recurrent PE.  Old records were reviewed per Care Everywhere.  Looks like his PE  occurred in September of 2020 in the setting of hospitalization.  He remains on it due to poor mobility and increased risk  Acute respiratory failure with hypoxia/obstructive sleep apnea, resolving Does not usually use oxygen at home- weaned off Continue nightly CPAP with home machine  History of asthma Stable  Chronic diastolic CHF Echocardiogram showed diastolic dysfunction. Seems to be stable.  Hyponatremia/hypokalemia/hypophosphatemia Resolved  Diabetes mellitus type 2 uncontrolled with hyperglycemia HbA1c 7.9.  Continue SSI.  Essential hypertension Well controlled on amlodipine hydralazine.  Acute kidney injury on chronic kidney disease stage IIIa, resolved Creatinine peaked at 1.81.  Improved and seems to be back to baseline.  Monitor urine output.  UTI with yeast, resolved Finished abx as above  QT prolongation Avoid QT prolonging medications.  Physical deconditioning/acute on chronic ambulatory dysfuction Patient mentions that he does not ambulate much at home due to history of peripheral neuropathy.  He also had severe physical deconditioning when he had COVID-19. PT continues to work with him - may benefit from SNF vs HHPT  Obesity Estimated body mass index is 38.92 kg/m as calculated from the following:   Height as of this encounter: _0  (1.854 m).   Weight as of this encounter: 133.8 kg.    DVT Prophylaxis: SCDs Code Status: Full code Family Communication: Discussed with the patient and his wife Disposition Plan:  Status is: Inpatient  Remains inpatient appropriate because:Ongoing active pain requiring inpatient pain management, IV treatments appropriate due to intensity of illness or inability to take PO and Inpatient level of care appropriate due to severity of illness   Dispo: The patient is from: Home              Anticipated d/c is to: Home with HH vs SNF  Patient currently is medically stable to d/c.   Difficult to place patient  No    Consultants: Gastroenterology.  General surgery  Procedures:   4/1 echocardiogram;Left Ventricle: LVEF= 60 to 65%.  -Grade II diastolic dysfunction (pseudonormalization).  4/2 ERCP;Choledocholithiasis s/p biliary sphincterotomy  with balloon extraction. - Ascending cholangitis - Periampullary diverticula. - Cholelithiasis with patent cystic duct. - Biopsy was performed  4/5 Lap Chole, liver biopsy, umbilical hernia repair    Medications:  Scheduled: . amLODipine  10 mg Oral Daily  . apixaban  5 mg Oral BID  . chlorhexidine  15 mL Mouth Rinse BID  . Chlorhexidine Gluconate Cloth  6 each Topical Daily  . hydrALAZINE  25 mg Oral Q6H  . insulin aspart  0-20 Units Subcutaneous TID WC  . insulin aspart  0-5 Units Subcutaneous QHS  . latanoprost  1 drop Left Eye QHS  . mouth rinse  15 mL Mouth Rinse q12n4p  . metoCLOPramide  5 mg Oral QHS  . polyethylene glycol  17 g Oral BID  . senna-docusate  1 tablet Oral BID   Continuous: . methocarbamol (ROBAXIN) IV     BVQ:[id number] **OR** acetaminophen, albuterol, methocarbamol (ROBAXIN) IV, metoprolol tartrate, morphine injection, oxyCODONE, prochlorperazine, traMADol   Objective:  Vital Signs  Vitals:   12/18/20 1937 12/18/20 2036 12/19/20 0445 12/19/20 0500  BP:  130/65 (!) 114/55   Pulse: 87 82 76   Resp: _0 Temp:  98.4 F (36.9 C) 98.3 F (36.8 C)   TempSrc:  Oral Oral   SpO2: 94% 94% 94%   Weight:    133.8 kg  Height:        Intake/Output Summary (Last 24 hours) at 12/19/2020 0752 Last data filed at 12/19/2020 0700 Gross per 24 hour  Intake 560 ml  Output 900 ml  Net -340 ml   Filed Weights   12/17/20 0954 12/18/20 0500 12/19/20 0500  Weight: 131 kg 130.2 kg 133.8 kg    General appearance: Awake alert.  In no distress Resp: Clear to auscultation bilaterally.   Normal effort Cardio: S1-S2 is normal regular.  No S3-S4.  No rubs murmurs or bruit GI: Abdomen is soft.  Nontender nondistended.  Bowel sounds are present normal.  No masses organomegaly Extremities: Mild edema bilateral lower extremities Neurologic: Alert and oriented x3.  No focal neurological deficits.     Lab Results:  Data Reviewed: I have personally reviewed following labs and imaging studies  CBC: Recent Labs  Lab 12/14/20 0603 12/15/20 0233 12/16/20 0349 12/17/20 0454 12/18/20 0355 12/19/20 0358  WBC 6.1 7.7 7.3 7.5 7.9 8.0  NEUTROABS 4.6 5.3 4.8 4.5 5.5  --   HGB 12.4* 12.2* 12.0* 12.2* 12.5* 11.7*  HCT 38.8* 39.3 37.6* 38.5* 41.0 37.1*  MCV 99.5 101.8* 98.9 99.2 101.5* 100.8*  PLT 138* 137* 165 191 205 491    Basic Metabolic Panel: Recent Labs  Lab 12/15/20 0233 12/16/20 0349 12/17/20 0454 12/18/20 0355 12/18/20 1337 12/19/20 0358  NA 132* 132* 135 134* 134* 133*  K 4.9 3.3* 3.8 5.8* 4.2 3.7  CL 101 102 103 99 99 100  CO2 19* 21* 20* 20* 21* 21*  GLUCOSE 155* 149* 183* 266* 311* 270*  BUN 24* _1 27* 31*  CREATININE 1.08 0.77 0.84 1.01 1.12 1.04  CALCIUM 8.8* 8.8* 9.2 9.5 9.3 9.2  MG 2.0 1.7 2.0 1.9  --  1.8  PHOS 1.9* 1.2* 2.0* 3.3  --  2.7  GFR: Estimated Creatinine Clearance: 93.5 mL/min (by C-G formula based on SCr of 1.04 mg/dL).  Liver Function Tests: Recent Labs  Lab 12/14/20 0603 12/15/20 0233 12/16/20 0349 12/17/20 0454 12/18/20 0355  AST 132* 182* 130* 78* 42*  ALT 127* 137* 121* 95* 72*  ALKPHOS 66 90 107 124 121  BILITOT 5.0* 6.1* 4.8* 3.4* 2.6*  PROT 5.9* 5.8* 5.6* 5.9* 6.2*  ALBUMIN 2.9* 2.7* 2.3* 2.4* 2.5*    Recent Labs  Lab 12/12/20 1940  LIPASE 25    Coagulation Profile: Recent Labs  Lab 12/12/20 1940 12/13/20 0258  INR 1.2 1.4*     CBG: Recent Labs  Lab 12/18/20 0450 12/18/20 0744 12/18/20 1257 12/18/20 1639 12/18/20 2038  GLUCAP 300* 265* 301* 340* 353*    Anemia Panel: No results for  input(s): VITAMINB12, FOLATE, FERRITIN, TIBC, IRON, RETICCTPCT in the last 72 hours.  Recent Results (from the past 240 hour(s))  Culture, blood (Routine x 2)     Status: Abnormal   Collection Time: 12/12/20  7:45 PM   Specimen: BLOOD  Result Value Ref Range Status   Specimen Description   Final    BLOOD RIGHT ANTECUBITAL Performed at [facility], [address] [place], [place] [address]    Special Requests   Final    BOTTLES DRAWN AEROBIC AND ANAEROBIC Blood Culture adequate volume Performed at [facility], [address] [place], [place] [address]    Culture  Setup Time   Final    GRAM NEGATIVE RODS IN BOTH AEROBIC AND ANAEROBIC BOTTLES CRITICAL RESULT CALLED TO, READ BACK BY AND VERIFIED WITHGuadlupe Spanish @ 1443 12/13/20 EB Performed at [facility], [place] [address] [place], [place] [address]    Culture KLEBSIELLA PNEUMONIAE (A)  Final   Report Status 12/15/2020 FINAL  Final   Organism ID, Bacteria KLEBSIELLA PNEUMONIAE  Final      Susceptibility   Klebsiella pneumoniae - MIC*    AMPICILLIN >=32 RESISTANT Resistant     CEFAZOLIN <=4 SENSITIVE Sensitive     CEFEPIME <=0.12 SENSITIVE Sensitive     CEFTAZIDIME <=1 SENSITIVE Sensitive     CEFTRIAXONE <=0.25 SENSITIVE Sensitive     CIPROFLOXACIN <=0.25 SENSITIVE Sensitive     GENTAMICIN <=1 SENSITIVE Sensitive     IMIPENEM <=0.25 SENSITIVE Sensitive     TRIMETH/SULFA <=20 SENSITIVE Sensitive     AMPICILLIN/SULBACTAM 8 SENSITIVE Sensitive     PIP/TAZO <=4 SENSITIVE Sensitive     * KLEBSIELLA PNEUMONIAE  Blood Culture ID Panel (Reflexed)     Status: Abnormal   Collection Time: 12/12/20  7:45 PM  Result Value Ref Range Status   Enterococcus faecalis NOT DETECTED NOT DETECTED Final   Enterococcus Faecium NOT DETECTED NOT DETECTED Final   Listeria monocytogenes NOT DETECTED NOT DETECTED Final   Staphylococcus species NOT DETECTED NOT DETECTED Final   Staphylococcus aureus (BCID) NOT DETECTED NOT  DETECTED Final   Staphylococcus epidermidis NOT DETECTED NOT DETECTED Final   Staphylococcus lugdunensis NOT DETECTED NOT DETECTED Final   Streptococcus species NOT DETECTED NOT DETECTED Final   Streptococcus agalactiae NOT DETECTED NOT DETECTED Final   Streptococcus pneumoniae NOT DETECTED NOT DETECTED Final   Streptococcus pyogenes NOT DETECTED NOT DETECTED Final   A.calcoaceticus-baumannii NOT DETECTED NOT DETECTED Final   Bacteroides fragilis NOT DETECTED NOT DETECTED Final   Enterobacterales DETECTED (A) NOT DETECTED Final    Comment: Enterobacterales represent a large order of gram negative bacteria, not a  single organism. CRITICAL RESULT CALLED TO, READ BACK BY AND VERIFIED WITH: N GLOGOVAC @ 2025 12/13/20 EB    Enterobacter cloacae complex NOT DETECTED NOT DETECTED Final   Escherichia coli NOT DETECTED NOT DETECTED Final   Klebsiella aerogenes NOT DETECTED NOT DETECTED Final   Klebsiella oxytoca NOT DETECTED NOT DETECTED Final   Klebsiella pneumoniae DETECTED (A) NOT DETECTED Final    Comment: CRITICAL RESULT CALLED TO, READ BACK BY AND VERIFIED WITH: N GLOGOVAC @ 1153 12/13/20 EB    Proteus species NOT DETECTED NOT DETECTED Final   Salmonella species NOT DETECTED NOT DETECTED Final   Serratia marcescens NOT DETECTED NOT DETECTED Final   Haemophilus influenzae NOT DETECTED NOT DETECTED Final   Neisseria meningitidis NOT DETECTED NOT DETECTED Final   Pseudomonas aeruginosa NOT DETECTED NOT DETECTED Final   Stenotrophomonas maltophilia NOT DETECTED NOT DETECTED Final   Candida albicans NOT DETECTED NOT DETECTED Final   Candida auris NOT DETECTED NOT DETECTED Final   Candida glabrata NOT DETECTED NOT DETECTED Final   Candida krusei NOT DETECTED NOT DETECTED Final   Candida parapsilosis NOT DETECTED NOT DETECTED Final   Candida tropicalis NOT DETECTED NOT DETECTED Final   Cryptococcus neoformans/gattii NOT DETECTED NOT DETECTED Final   CTX-M ESBL NOT DETECTED NOT DETECTED Final    Carbapenem resistance IMP NOT DETECTED NOT DETECTED Final   Carbapenem resistance KPC NOT DETECTED NOT DETECTED Final   Carbapenem resistance NDM NOT DETECTED NOT DETECTED Final   Carbapenem resist OXA 48 LIKE NOT DETECTED NOT DETECTED Final   Carbapenem resistance VIM NOT DETECTED NOT DETECTED Final    Comment: Performed at [facility], [address] [place], [place] [address]  Resp Panel by RT-PCR (Flu A&B, Covid) Nasopharyngeal Swab     Status: None   Collection Time: 12/12/20  8:15 PM   Specimen: Nasopharyngeal Swab; Nasopharyngeal(NP) swabs in vial transport medium  Result Value Ref Range Status   SARS Coronavirus 2 by RT PCR NEGATIVE NEGATIVE Final    Comment: (NOTE) SARS-CoV-2 target nucleic acids are NOT DETECTED.  The SARS-CoV-2 RNA is generally detectable in upper respiratory specimens during the acute phase of infection. The lowest concentration of SARS-CoV-2 viral copies this assay can detect is 138 copies/mL. A negative result does not preclude SARS-Cov-2 infection and should not be used as the sole basis for treatment or other patient management decisions. A negative result may occur with  improper specimen collection/handling, submission of specimen other than nasopharyngeal swab, presence of viral mutation(s) within the areas targeted by this assay, and inadequate number of viral copies(<138 copies/mL). A negative result must be combined with clinical observations, patient history, and epidemiological information. The expected result is Negative.  Fact Sheet for Patients:  EntrepreneurPulse.com.au  Fact Sheet for Healthcare Providers:  IncredibleEmployment.be  This test is no t yet approved or cleared by the [place] FDA and  has been authorized for detection and/or diagnosis of SARS-CoV-2 by FDA under an Emergency Use Authorization (EUA). This EUA will remain  in effect (meaning this test can be used) for the  duration of the COVID-19 declaration under Section 564(b)(1) of the Act, 21 U.S.C.section 360bbb-3(b)(1), unless the authorization is terminated  or revoked sooner.       Influenza A by PCR NEGATIVE NEGATIVE Final   Influenza B by PCR NEGATIVE NEGATIVE Final    Comment: (NOTE) The Xpert Xpress SARS-CoV-2/FLU/RSV plus assay is intended as an aid in the diagnosis of influenza from Nasopharyngeal swab specimens and  should not be used as a sole basis for treatment. Nasal washings and aspirates are unacceptable for Xpert Xpress SARS-CoV-2/FLU/RSV testing.  Fact Sheet for Patients: EntrepreneurPulse.com.au  Fact Sheet for Healthcare Providers: IncredibleEmployment.be  This test is not yet approved or cleared by the [place] FDA and has been authorized for detection and/or diagnosis of SARS-CoV-2 by FDA under an Emergency Use Authorization (EUA). This EUA will remain in effect (meaning this test can be used) for the duration of the COVID-19 declaration under Section 564(b)(1) of the Act, 21 U.S.C. section 360bbb-3(b)(1), unless the authorization is terminated or revoked.  Performed at [facility], [address] [place], [place] [address]   Culture, blood (Routine x 2)     Status: Abnormal   Collection Time: 12/12/20  9:20 PM   Specimen: BLOOD  Result Value Ref Range Status   Specimen Description   Final    BLOOD Blood Culture adequate volume Performed at [facility], [address] [place], [place] [address]    Special Requests   Final    BOTTLES DRAWN AEROBIC AND ANAEROBIC LEFT ANTECUBITAL Performed at [facility], [address] [place], [place] [address]    Culture  Setup Time   Final    GRAM NEGATIVE RODS IN BOTH AEROBIC AND ANAEROBIC BOTTLES CRITICAL VALUE NOTED.  VALUE IS CONSISTENT WITH PREVIOUSLY REPORTED AND CALLED VALUE.    Culture (A)  Final    KLEBSIELLA PNEUMONIAE SUSCEPTIBILITIES  PERFORMED ON PREVIOUS CULTURE WITHIN THE LAST 5 DAYS. Performed at [facility], [place] [address] [place], [place] [address]    Report Status 12/15/2020 FINAL  Final  MRSA PCR Screening     Status: None   Collection Time: 12/13/20  1:29 AM   Specimen: Nasopharyngeal  Result Value Ref Range Status   MRSA by PCR NEGATIVE NEGATIVE Final    Comment:        The GeneXpert MRSA Assay (FDA approved for NASAL specimens only), is one component of a comprehensive MRSA colonization surveillance program. It is not intended to diagnose MRSA infection nor to guide or monitor treatment for MRSA infections. Performed at [facility], [place] [address], [place], [place] [address]   Urine Culture     Status: Abnormal   Collection Time: 12/14/20 12:39 AM   Specimen: Urine, Random  Result Value Ref Range Status   Specimen Description   Final    URINE, RANDOM Performed at [facility] [address] [place], [place] [address]    Special Requests   Final    NONE Performed at [facility], [place] [address] [place],  [address]    Culture 40,000 COLONIES/mL YEAST (A)  Final   Report Status 12/15/2020 FINAL  Final      Radiology Studies: No results found.     LOS: 6 days   Little Ishikawa  [facility] Pager on [email or web address]  12/19/2020, 7:52 AM

## 2020-12-19 NOTE — Plan of Care (Signed)
Patient tolerating diet without any nausea/vomiting.  Patient  denies passing any gas.  Patient only allowed staff to turn him once on 7 a to 7 p shift.  Staff also offered to sit patient on side of bed which he deferred.  PT to see patient on 12/20/2020. Wife at bedside.

## 2020-12-19 NOTE — Progress Notes (Signed)
[facility] Progress Note  2 Days Post-Op  Subjective: CC-  Comfortable this morning. Abdomen a little sore but no severe pain. Has not required any pain medication. Denies n/v. Tolerating diet.  Objective: Vital signs in last 24 hours: Temp:  [98 F (36.7 C)-98.4 F (36.9 C)] 98.3 F (36.8 C) (04/07 0445) Pulse Rate:  [76-87] 76 (04/07 0445) Resp:  [18-20] 18 (04/07 0445) BP: (114-136)/(55-65) 114/55 (04/07 0445) SpO2:  [94 %-98 %] 94 % (04/07 0445) Weight:  [133.8 kg] 133.8 kg (04/07 0500) Last BM Date: 12/14/20  Intake/Output from previous day: 04/06 0701 - 04/07 0700 In: 560 [P.O.:360; IV Piggyback:200] Out: 900 [Urine:900] Intake/Output this shift: No intake/output data recorded.  PE: Gen:  Alert, NAD, pleasant Pulm: rate and effort normal Abd: obese, soft, nontender, +BS, lap incisions C/D/I without erythema or drainage Skin: no rashes noted, warm and dry  Lab Results:  Recent Labs    12/18/20 0355 12/19/20 0358  WBC 7.9 8.0  HGB 12.5* 11.7*  HCT 41.0 37.1*  PLT 205 225   BMET Recent Labs    12/18/20 1337 12/19/20 0358  NA 134* 133*  K 4.2 3.7  CL 99 100  CO2 21* 21*  GLUCOSE 311* 270*  BUN 27* 31*  CREATININE 1.12 1.04  CALCIUM 9.3 9.2   PT/INR No results for input(s): LABPROT, INR in the last 72 hours. CMP     Component Value Date/Time   NA 133 (L) 12/19/2020 0358   K 3.7 12/19/2020 0358   CL 100 12/19/2020 0358   CO2 21 (L) 12/19/2020 0358   GLUCOSE 270 (H) 12/19/2020 0358   BUN 31 (H) 12/19/2020 0358   CREATININE 1.04 12/19/2020 0358   CALCIUM 9.2 12/19/2020 0358   PROT 6.2 (L) 12/18/2020 0355   ALBUMIN 2.5 (L) 12/18/2020 0355   AST 42 (H) 12/18/2020 0355   ALT 72 (H) 12/18/2020 0355   ALKPHOS 121 12/18/2020 0355   BILITOT 2.6 (H) 12/18/2020 0355   GFRNONAA >60 12/19/2020 0358   Lipase     Component Value Date/Time   LIPASE 25 12/12/2020 1940       Studies/Results: No results  found.  Anti-infectives: Anti-infectives (From admission, onward)   Start     Dose/Rate Route Frequency Ordered Stop   12/16/20 1100  fluconazole (DIFLUCAN) tablet 100 mg  Status:  Discontinued        100 mg Oral Daily 12/16/20 1005 12/18/20 1409   12/16/20 1000  fluconazole (DIFLUCAN) IVPB 100 mg  Status:  Discontinued        100 mg 50 mL/hr over 60 Minutes Intravenous Every 24 hours 12/15/20 1427 12/16/20 1005   12/15/20 1530  fluconazole (DIFLUCAN) IVPB 200 mg        200 mg 100 mL/hr over 60 Minutes Intravenous  Once 12/15/20 1426 12/15/20 1701   12/15/20 0900  cefTRIAXone (ROCEPHIN) 2 g in sodium chloride 0.9 % 100 mL IVPB  Status:  Discontinued        2 g 200 mL/hr over 30 Minutes Intravenous Every 24 hours 12/15/20 0814 12/18/20 1409   12/13/20 2200  vancomycin (VANCOREADY) IVPB 1500 mg/300 mL  Status:  Discontinued        1,500 mg 150 mL/hr over 120 Minutes Intravenous Every 24 hours 12/12/20 2054 12/13/20 1003   12/13/20 2200  vancomycin (VANCOREADY) IVPB 1250 mg/250 mL  Status:  Discontinued        1,250 mg 166.7 mL/hr over 90 Minutes Intravenous Every 24 hours  12/13/20 1003 12/13/20 1256   12/13/20 0800  metroNIDAZOLE (FLAGYL) IVPB 500 mg        500 mg 100 mL/hr over 60 Minutes Intravenous Every 8 hours 12/13/20 0241 12/18/20 1900   12/12/20 2245  metroNIDAZOLE (FLAGYL) IVPB 500 mg        500 mg 100 mL/hr over 60 Minutes Intravenous  Once 12/12/20 2235 12/13/20 0108   12/12/20 2200  ceFEPIme (MAXIPIME) 2 g in sodium chloride 0.9 % 100 mL IVPB  Status:  Discontinued        2 g 200 mL/hr over 30 Minutes Intravenous Every 12 hours 12/12/20 2024 12/12/20 2033   12/12/20 2130  vancomycin (VANCOCIN) IVPB 1000 mg/200 mL premix       "Followed by" Linked Group Details   1,000 mg 200 mL/hr over 60 Minutes Intravenous  Once 12/12/20 2024 12/12/20 2348   12/12/20 2045  ceFEPIme (MAXIPIME) 2 g in sodium chloride 0.9 % 100 mL IVPB  Status:  Discontinued        2 g 200 mL/hr over 30  Minutes Intravenous Every 12 hours 12/12/20 2033 12/15/20 0814   12/12/20 2030  vancomycin (VANCOCIN) IVPB 1000 mg/200 mL premix       "Followed by" Linked Group Details   1,000 mg 200 mL/hr over 60 Minutes Intravenous  Once 12/12/20 2024 12/12/20 2225       Assessment/Plan Cholangitis -s/p successful ERCP 4/2 withsphincterotomy and removal of 3 stones. GI recommendsliver biopsy at the time of cholecystectomy to rule out underlying cirrhosis - LFTs are trending down (12/18/20)  S/p laparoscopic cholecystectomy, liver biopsy, primary repair umbilical hernia 4/5 Dr. Gerrit Friends - POD#2 - Liver biopsy pending - Doing well from surgical standpoint. Tolerating diet. Has not needed any pain medication. Ok for discharge once disposition arranged (home vs SNF depending on progress). Discharge instructions and follow up info on AVS.  HTN DM OSA - CPAP Asthma - uses inhaler PRN, not on home O2.  Acute respiratory failure with hypoxia Acute diastolic CHF - EF 60-65% on ECHO 12/13/20 CKD stage III - baseline Cr 1~.48 Wheelchair bound x1 year post-covid-19 infection Obesity BMI 38.42 Hx PE on eliquis (last dose 3/31) UTI - yeast on Ucx, completed diflucan Bacteremia - klebsiella pneumoniae, completed abx  ID -diflucan 4/3>>4/6, rocephin 4/3>>4/6, flagyl 3/31>>4/6, vancomycin 3/31>>4/1, cefepime 3/31 x1 FEN -IVF, CM diet VTE -SCDs, eliquis restarted 4/6 and hgb stable Foley -none Follow up -[facility] clinic   LOS: 6 days    Franne Forts, [facility] 12/19/2020, 8:27 AM Please see Amion for pager number during day hours 7:00am-4:30pm

## 2020-12-20 DIAGNOSIS — K8309 Other cholangitis: Secondary | ICD-10-CM | POA: Diagnosis not present

## 2020-12-20 LAB — CBC
HCT: 37.2 % — ABNORMAL LOW (ref 39.0–52.0)
Hemoglobin: 11.3 g/dL — ABNORMAL LOW (ref 13.0–17.0)
MCH: 31.1 pg (ref 26.0–34.0)
MCHC: 30.4 g/dL (ref 30.0–36.0)
MCV: 102.5 fL — ABNORMAL HIGH (ref 80.0–100.0)
Platelets: 220 10*3/uL (ref 150–400)
RBC: 3.63 MIL/uL — ABNORMAL LOW (ref 4.22–5.81)
RDW: 16.1 % — ABNORMAL HIGH (ref 11.5–15.5)
WBC: 6.7 10*3/uL (ref 4.0–10.5)
nRBC: 0 % (ref 0.0–0.2)

## 2020-12-20 LAB — MAGNESIUM: Magnesium: 1.7 mg/dL (ref 1.7–2.4)

## 2020-12-20 LAB — GLUCOSE, CAPILLARY
Glucose-Capillary: 269 mg/dL — ABNORMAL HIGH (ref 70–99)
Glucose-Capillary: 334 mg/dL — ABNORMAL HIGH (ref 70–99)
Glucose-Capillary: 254 mg/dL — ABNORMAL HIGH (ref 70–99)
Glucose-Capillary: 248 mg/dL — ABNORMAL HIGH (ref 70–99)

## 2020-12-20 LAB — SURGICAL PATHOLOGY

## 2020-12-20 LAB — PHOSPHORUS: Phosphorus: 3.1 mg/dL (ref 2.5–4.6)

## 2020-12-20 MED ORDER — BISACODYL 10 MG RE SUPP
10.0000 mg | Freq: Once | RECTAL | Status: AC
  Administered 2020-12-20: 10 mg via RECTAL
  Filled 2020-12-20: qty 1

## 2020-12-20 MED ORDER — DOCUSATE SODIUM 100 MG PO CAPS
100.0000 mg | ORAL_CAPSULE | Freq: Two times a day (BID) | ORAL | Status: DC
  Administered 2020-12-20 – 2020-12-21 (×4): 100 mg via ORAL
  Filled 2020-12-20 (×7): qty 1

## 2020-12-20 NOTE — Progress Notes (Signed)
Occupational Therapy Treatment Patient Details Name: Randy Liu MRN: [id number] DOB: [birth year] Today's Date: 12/20/2020    History of present illness Pt is a 70 yo male presents with abdominal pain, nausea, vomiting. 4/2 ERCP, 4/5 Laparoscopic cholecystectomy, laparoscopic liver biopsy, and primary repair umbilical hernia. PMH: asthma, COVID Dec 2020, diabetes, DM neuropathy, HTN, HLD, CKD stage III, OSA on CPAP, history of PE, GERD, IBS.   OT comments  Patient reports fatigued after bowel movement and rolling in bed for peri care with nursing staff but agreeable to work with therapy. Patient requiring max x2 for bed mobility and cues for log roll technique. Patient with poor to zero sitting balance relying heavily on UE support and up to max A due to posterior and R lateral leaning. Patient tolerate sitting upright for 8 minutes while participating in functional reaching to facilitate core stability. Continue to recommend rehab at D/C.    Follow Up Recommendations  SNF    Equipment Recommendations  None recommended by OT       Precautions / Restrictions Precautions Precautions: Fall Precaution Comments: abd surgery Restrictions Weight Bearing Restrictions: No       Mobility Bed Mobility Overal bed mobility: Needs Assistance Bed Mobility: Rolling;Sidelying to Sit;Sit to Sidelying Rolling: Mod assist Sidelying to sit: Max assist;+2 for physical assistance;+2 for safety/equipment;HOB elevated     Sit to sidelying: Max assist;+2 for physical assistance General bed mobility comments: re-educate/instruct on log roll technique. patient with significant core and LE weakness needing max x2 to bring LEs off EOB and trunk up to sitting. Patient needing max x2 to lfit LEs back onto bed at end of session and assist to guide trunk    Transfers                 General transfer comment: unable, poor to zero sitting balance therefore unsafe to try slide board    Balance  Overall balance assessment: Needs assistance Sitting-balance support: Feet supported;Bilateral upper extremity supported Sitting balance-Leahy Scale: Zero Sitting balance - Comments: max-min A for sitting EOB x8 minutes, also requires assist to return to sitting in midline Postural control: Posterior lean;Right lateral lean     Standing balance comment: slideboard transfers at baseline                                        Cognition Arousal/Alertness: Awake/alert Behavior During Therapy: WFL for tasks assessed/performed Overall Cognitive Status: Within Functional Limits for tasks assessed                                 General Comments: HOH, needs encouragement and education        Exercises Exercises: Other exercises Other Exercises Other Exercises: x10 crossing body midline/functional reach to work on weight shifting and balance in preparation for slide board transfers      General Comments Pt reports dizziness in sitting with BP 144/83, on RA with SpO2 90-93% during session.    Pertinent Vitals/ Pain       Pain Assessment: Faces  Faces Pain Scale: Hurts little more Pain Location: abdomen Pain Descriptors / Indicators: Tender Pain Intervention(s): Monitored during session         Frequency  Min 2X/week        Progress Toward Goals  OT Goals(current goals can now be found in the  care plan section)  Progress towards OT goals: Progressing toward goals  Acute Rehab OT Goals Patient Stated Goal: SNF vs home, slideboard transfer to w/c OT Goal Formulation: With patient/family Time For Goal Achievement: 01/01/21 Potential to Achieve Goals: Good ADL Goals Pt Will Transfer to Toilet: with min assist;with transfer board (drop arm bariatric) Additional ADL Goal #1: Patient will require min A for bed mobility in preparation for self care tasks. Additional ADL Goal #2: Patient will tolerate sitting up edge of bed for 10 minutes with good  sitting balance in order to participate in self care tasks and functional transfers.  Plan Discharge plan remains appropriate    Co-evaluation    PT/OT/SLP Co-Evaluation/Treatment: Yes Reason for Co-Treatment: For patient/therapist safety;To address functional/ADL transfers PT goals addressed during session: Mobility/safety with mobility;Balance;Strengthening/ROM OT goals addressed during session: Strengthening/ROM;ADL's and self-care      AM-PAC OT "6 Clicks" Daily Activity     Outcome Measure   Help from another person eating meals?: A Little Help from another person taking care of personal grooming?: A Little Help from another person toileting, which includes using toliet, bedpan, or urinal?: Total Help from another person bathing (including washing, rinsing, drying)?: A Lot Help from another person to put on and taking off regular upper body clothing?: A Lot Help from another person to put on and taking off regular lower body clothing?: Total 6 Click Score: 12    End of Session  OT Visit Diagnosis: Other abnormalities of gait and mobility (R26.89);Muscle weakness (generalized) (M62.81);Pain Pain - part of body:  (abdomen)   Activity Tolerance Patient tolerated treatment well;Patient limited by fatigue   Patient Left in bed;with call bell/phone within reach;with family/visitor present;with bed alarm set   Nurse Communication Mobility status        Time: 4270-6237 OT Time Calculation (min): 24 min  Charges: OT General Charges $OT Visit: 1 Visit OT Treatments $Self Care/Home Management : 8-22 mins  Marlyce Huge OT OT pager: [phone]   Carmelia Roller 12/20/2020, 3:08 PM

## 2020-12-20 NOTE — Care Management Important Message (Signed)
Medicare IM printed for Social Work staff to give to the patient 

## 2020-12-20 NOTE — Progress Notes (Incomplete)
Inpatient Diabetes Program Recommendations  AACE/ADA: New Consensus Statement on Inpatient Glycemic Control (2015)  Target Ranges:  Prepandial:   less than 140 mg/dL      Peak postprandial:   less than 180 mg/dL (1-2 hours)      Critically ill patients:  140 - 180 mg/dL   Lab Results  Component Value Date   GLUCAP 269 (H) 12/20/2020   HGBA1C 7.9 (H) 12/13/2020    Review of Glycemic Control  DM2 Outpatient Diabetes medications: 70/30 81 units in am and 68 units in evening, Invokana 25 mg QAM Current orders for Inpatient glycemic control: Novolog 0-20 units TID with meals and 0-5 HS  HgbA1C - 7.9% Need part of basal insulin from home 70/30.  Inpatient Diabetes Program Recommendations:     Add Lantus 15 units QHS

## 2020-12-20 NOTE — Progress Notes (Addendum)
Physical Therapy Treatment Patient Details Name: Randy Liu MRN: [id number] DOB: 1950/10/03 Today's Date: 12/20/2020    History of Present Illness Pt is a 69 yo male presents with abdominal pain, nausea, vomiting. 4/2 ERCP, 4/5 Laparoscopic cholecystectomy, laparoscopic liver biopsy, and primary repair umbilical hernia. PMH: asthma, COVID Dec 2020, diabetes, DM neuropathy, HTN, HLD, CKD stage III, OSA on CPAP, history of PE, GERD, IBS.    PT Comments    Pt recently changed from BM and fatigued limiting session, but motivated and willing to participate. Pt requiring max A+2 to sit up at EOB for BLE and trunk management. Pt seated EOB with min-max A for 8 minutes, fatigues with reaching exercises, multimodal cues and assist to return to midline and avoid posterior lean. Pt fatigues easily with therapy this session, unable to attempt slide transfer up to [facility] so assisted back to supine max A +2 for lifting BLE and controlling trunk down to bed. Continued education regarding OOB to chair to promote core strength and upright tolerance. Pt's spouse present and providing encouragement throughout session. Pt and pt's spouse in agreement with SNF referral to return to PLOF, completing slide board transfers with spouse assist. Will continue to progress acute PT as able.   Follow Up Recommendations  SNF     Equipment Recommendations  None recommended by PT    Recommendations for Other Services       Precautions / Restrictions Precautions Precautions: Fall Precaution Comments: abd surgery Restrictions Weight Bearing Restrictions: No    Mobility  Bed Mobility Overal bed mobility: Needs Assistance Bed Mobility: Rolling;Sidelying to Sit;Sit to Sidelying Rolling: Mod assist Sidelying to sit: Max assist;+2 for physical assistance;+2 for safety/equipment;HOB elevated  Sit to sidelying: Max assist;+2 for physical assistance General bed mobility comments: educated on hand placement and  sequencing with log rolling, max A +2 to slide BLE off of bed and upright trunk into sitting with HOB elevated; max A +2 to lift BLE back into bed and control trunk back into sidelying position; once supine, assisted pt's BLE into hooklying position, cued to reach for headboard to pull up in bed and pushing through heels with bed in trendelenburg    Transfers  General transfer comment: unable, too fatigued  Ambulation/Gait  General Gait Details: nonambulatory at baseline   Stairs             Wheelchair Mobility    Modified Rankin (Stroke Patients Only)       Balance Overall balance assessment: Needs assistance Sitting-balance support: Feet supported;Bilateral upper extremity supported Sitting balance-Leahy Scale: Zero Sitting balance - Comments: max-min A +2 for sitting EOB x8 minutes, also requires assist to return to sitting in midline Postural control: Posterior lean  Standing balance comment: slideboard transfers at baseline         Cognition Arousal/Alertness: Awake/alert Behavior During Therapy: WFL for tasks assessed/performed Overall Cognitive Status: Within Functional Limits for tasks assessed  General Comments: HOH, needs encouragement and education      Exercises      General Comments General comments (skin integrity, edema, etc.): Pt reports dizziness in sitting with BP 144/83, on RA with SpO2 90-93% during session.      Pertinent Vitals/Pain Pain Assessment: 0-10 Pain Score: 1  Pain Location: abdomen Pain Descriptors / Indicators: Tender Pain Intervention(s): Limited activity within patient's tolerance;Monitored during session;Repositioned    Home Living                      Prior  Function            PT Goals (current goals can now be found in the care plan section) Acute Rehab PT Goals Patient Stated Goal: SNF vs home, slideboard transfer to w/c PT Goal Formulation: With patient/family Time For Goal Achievement:  01/01/21 Potential to Achieve Goals: Fair Progress towards PT goals: Progressing toward goals    Frequency    Min 2X/week      PT Plan      Co-evaluation PT/OT/SLP Co-Evaluation/Treatment: Yes Reason for Co-Treatment: Complexity of the patient's impairments (multi-system involvement);For patient/therapist safety PT goals addressed during session: Mobility/safety with mobility;Balance;Strengthening/ROM        AM-PAC PT "6 Clicks" Mobility   Outcome Measure  Help needed turning from your back to your side while in a flat bed without using bedrails?: Total Help needed moving from lying on your back to sitting on the side of a flat bed without using bedrails?: Total Help needed moving to and from a bed to a chair (including a wheelchair)?: Total Help needed standing up from a chair using your arms (e.g., wheelchair or bedside chair)?: Total Help needed to walk in hospital room?: Total Help needed climbing 3-5 steps with a railing? : Total 6 Click Score: 6    End of Session   Activity Tolerance: Patient limited by fatigue;Patient tolerated treatment well Patient left: in bed;with call bell/phone within reach;with bed alarm set;with family/visitor present Nurse Communication: Mobility status;Need for lift equipment (hoyer OOB) PT Visit Diagnosis: Other abnormalities of gait and mobility (R26.89);Muscle weakness (generalized) (M62.81)     Time: 4259-5638 PT Time Calculation (min) (ACUTE ONLY): 24 min  Charges:  $Therapeutic Activity: 8-22 mins                      Tori Yuliya Nova PT, DPT 12/20/20, 2:45 PM

## 2020-12-20 NOTE — Progress Notes (Signed)
[facility] Progress Note  3 Days Post-Op  Subjective: CC-  Wife at bedside. Abdomen still a little sore but no significant pain. Tolerating diet. Denies n/v. Passing flatus, no BM since admission.  Objective: Vital signs in last 24 hours: Temp:  [97.8 F (36.6 C)-98.6 F (37 C)] 97.8 F (36.6 C) (04/08 0527) Pulse Rate:  [80-89] 80 (04/08 0527) Resp:  [18-20] 18 (04/08 0527) BP: (111-139)/(54-69) 135/69 (04/08 0527) SpO2:  [92 %-94 %] 94 % (04/08 0527) Weight:  [132.1 kg-132.2 kg] 132.2 kg (04/08 0635) Last BM Date: 12/14/20  Intake/Output from previous day: 04/07 0701 - 04/08 0700 In: 960 [P.O.:960] Out: 950 [Urine:950] Intake/Output this shift: No intake/output data recorded.  PE: Gen: Alert, NAD, pleasant Pulm: rate and effort normal ALP:FXTKW, soft,nontender, +BS,lapincisions C/D/I without erythema or drainage Skin: no rashes noted, warm and dry  Lab Results:  Recent Labs    12/19/20 0358 12/20/20 0430  WBC 8.0 6.7  HGB 11.7* 11.3*  HCT 37.1* 37.2*  PLT 225 220   BMET Recent Labs    12/18/20 1337 12/19/20 0358  NA 134* 133*  K 4.2 3.7  CL 99 100  CO2 21* 21*  GLUCOSE 311* 270*  BUN 27* 31*  CREATININE 1.12 1.04  CALCIUM 9.3 9.2   PT/INR No results for input(s): LABPROT, INR in the last 72 hours. CMP     Component Value Date/Time   NA 133 (L) 12/19/2020 0358   K 3.7 12/19/2020 0358   CL 100 12/19/2020 0358   CO2 21 (L) 12/19/2020 0358   GLUCOSE 270 (H) 12/19/2020 0358   BUN 31 (H) 12/19/2020 0358   CREATININE 1.04 12/19/2020 0358   CALCIUM 9.2 12/19/2020 0358   PROT 6.2 (L) 12/18/2020 0355   ALBUMIN 2.5 (L) 12/18/2020 0355   AST 42 (H) 12/18/2020 0355   ALT 72 (H) 12/18/2020 0355   ALKPHOS 121 12/18/2020 0355   BILITOT 2.6 (H) 12/18/2020 0355   GFRNONAA >60 12/19/2020 0358   Lipase     Component Value Date/Time   LIPASE 25 12/12/2020 1940       Studies/Results: No results  found.  Anti-infectives: Anti-infectives (From admission, onward)   Start     Dose/Rate Route Frequency Ordered Stop   12/16/20 1100  fluconazole (DIFLUCAN) tablet 100 mg  Status:  Discontinued        100 mg Oral Daily 12/16/20 1005 12/18/20 1409   12/16/20 1000  fluconazole (DIFLUCAN) IVPB 100 mg  Status:  Discontinued        100 mg 50 mL/hr over 60 Minutes Intravenous Every 24 hours 12/15/20 1427 12/16/20 1005   12/15/20 1530  fluconazole (DIFLUCAN) IVPB 200 mg        200 mg 100 mL/hr over 60 Minutes Intravenous  Once 12/15/20 1426 12/15/20 1701   12/15/20 0900  cefTRIAXone (ROCEPHIN) 2 g in sodium chloride 0.9 % 100 mL IVPB  Status:  Discontinued        2 g 200 mL/hr over 30 Minutes Intravenous Every 24 hours 12/15/20 0814 12/18/20 1409   12/13/20 2200  vancomycin (VANCOREADY) IVPB 1500 mg/300 mL  Status:  Discontinued        1,500 mg 150 mL/hr over 120 Minutes Intravenous Every 24 hours 12/12/20 2054 12/13/20 1003   12/13/20 2200  vancomycin (VANCOREADY) IVPB 1250 mg/250 mL  Status:  Discontinued        1,250 mg 166.7 mL/hr over 90 Minutes Intravenous Every 24 hours 12/13/20 1003 12/13/20 1256  12/13/20 0800  metroNIDAZOLE (FLAGYL) IVPB 500 mg        500 mg 100 mL/hr over 60 Minutes Intravenous Every 8 hours 12/13/20 0241 12/18/20 1900   12/12/20 2245  metroNIDAZOLE (FLAGYL) IVPB 500 mg        500 mg 100 mL/hr over 60 Minutes Intravenous  Once 12/12/20 2235 12/13/20 0108   12/12/20 2200  ceFEPIme (MAXIPIME) 2 g in sodium chloride 0.9 % 100 mL IVPB  Status:  Discontinued        2 g 200 mL/hr over 30 Minutes Intravenous Every 12 hours 12/12/20 2024 12/12/20 2033   12/12/20 2130  vancomycin (VANCOCIN) IVPB 1000 mg/200 mL premix       "Followed by" Linked Group Details   1,000 mg 200 mL/hr over 60 Minutes Intravenous  Once 12/12/20 2024 12/12/20 2348   12/12/20 2045  ceFEPIme (MAXIPIME) 2 g in sodium chloride 0.9 % 100 mL IVPB  Status:  Discontinued        2 g 200 mL/hr over 30  Minutes Intravenous Every 12 hours 12/12/20 2033 12/15/20 0814   12/12/20 2030  vancomycin (VANCOCIN) IVPB 1000 mg/200 mL premix       "Followed by" Linked Group Details   1,000 mg 200 mL/hr over 60 Minutes Intravenous  Once 12/12/20 2024 12/12/20 2225       Assessment/Plan Cholangitis -s/p successful ERCP 4/2 withsphincterotomy and removal of 3 stones. GI recommendsliver biopsy at the time of cholecystectomy to rule out underlying cirrhosis - LFTs are trending down (12/18/20)  S/p laparoscopic cholecystectomy, liver biopsy, primary repair umbilical hernia 4/5 Dr. Gerrit Friends - POD#3 - Liver biopsy pending - Doing well from surgical standpoint. Tolerating diet. Dulcolax suppository today for constipation. Ok for discharge once disposition arranged (home vs SNF depending on progress). Discharge instructions and follow up info on AVS.  HTN DM OSA - CPAP Asthma - uses inhaler PRN, not on home O2.  Acute respiratory failure with hypoxia Acute diastolic CHF - EF 60-65% on ECHO 12/13/20 CKD stage III - baseline Cr 1~.48 Wheelchair bound x1 year post-covid-19 infection Obesity BMI 38.42 Hx PE on eliquis (last dose 3/31) UTI - yeast on Ucx, completed diflucan Bacteremia - klebsiella pneumoniae, completed abx  ID -diflucan 4/3>>4/6, rocephin 4/3>>4/6, flagyl 3/31>>4/6, vancomycin 3/31>>4/1, cefepime 3/31 x1 FEN -CM diet VTE -SCDs,eliquis restarted 4/6 and hgb stable Foley -none Follow up -[facility] clinic   LOS: 7 days    Franne Forts, [facility] Surgery 12/20/2020, 9:51 AM Please see Amion for pager number during day hours 7:00am-4:30pm

## 2020-12-20 NOTE — NC FL2 (Signed)
[place] MEDICAID FL2 LEVEL OF CARE SCREENING TOOL     IDENTIFICATION  Patient Name: Randy Liu Birthdate: 1951-01-04 Sex: male Admission Date (Current Location): 12/12/2020  [facility] and [place] Number:  Producer, television/film/video and Address:  [facility],  [address] [place], [place] [address]      Provider Number: [id number]  Attending Physician Name and Address:  Azucena Fallen, MD  Relative Name and Phone Number:  TRAJON, ROSETE [phone]  [phone]    Current Level of Care: SNF Recommended Level of Care: Skilled Nursing Facility Prior Approval Number:    Date Approved/Denied:   PASRR Number: [id number]  Discharge Plan: SNF    Current Diagnoses: Patient Active Problem List   Diagnosis Date Noted  . Yeast infection 12/15/2020  . Elevated LFTs 12/15/2020  . Acute hypoxemic respiratory failure (HCC) 12/13/2020  . Hyponatremia 12/13/2020  . Asthma 12/13/2020  . Diabetes (HCC) 12/13/2020  . Acute cholangitis 12/12/2020    Orientation RESPIRATION BLADDER Height & Weight     Self,Time,Situation,Place  Normal Incontinent Weight: 132.2 kg Height:  6\' 1"  (185.4 cm)  BEHAVIORAL SYMPTOMS/MOOD NEUROLOGICAL BOWEL NUTRITION STATUS      Continent Diet  AMBULATORY STATUS COMMUNICATION OF NEEDS Skin   Extensive Assist Verbally Surgical wounds (Abdomen Incision)                       Personal Care Assistance Level of Assistance  Bathing,Feeding,Dressing Bathing Assistance: Maximum assistance Feeding assistance: Independent Dressing Assistance: Maximum assistance     Functional Limitations Info  Sight,Hearing,Speech Sight Info: Impaired Hearing Info: Impaired Speech Info: Adequate    SPECIAL CARE FACTORS FREQUENCY  PT (By licensed PT),OT (By licensed OT)     PT Frequency: x5 week OT Frequency: x5 week            Contractures Contractures Info: Not present    Additional Factors Info  Code Status,Allergies Code  Status Info: FULL Allergies Info: Metoclopramide, Nsaids, Amoxicillin-pot Clavulanate, Statins, Allopurinol, Empagliflozin, Tiotropium           Current Medications (12/20/2020):  This is the current hospital active medication list Current Facility-Administered Medications  Medication Dose Route Frequency Provider Last Rate Last Admin  . acetaminophen (TYLENOL) tablet 650 mg  650 mg Oral Q6H PRN Meuth, Brooke A, PA-C   650 mg at 12/15/20 1517   Or  . acetaminophen (TYLENOL) suppository 650 mg  650 mg Rectal Q6H PRN Meuth, Brooke A, PA-C      . albuterol (PROVENTIL) (2.5 MG/3ML) 0.083% nebulizer solution 2.5 mg  2.5 mg Nebulization Q6H PRN Meuth, Brooke A, PA-C      . amLODipine (NORVASC) tablet 10 mg  10 mg Oral Daily Meuth, Brooke A, PA-C   10 mg at 12/20/20 0944  . apixaban (ELIQUIS) tablet 5 mg  5 mg Oral BID 02/19/21, MD   5 mg at 12/20/20 0945  . chlorhexidine (PERIDEX) 0.12 % solution 15 mL  15 mL Mouth Rinse BID Meuth, Brooke A, PA-C   15 mL at 12/20/20 0944  . docusate sodium (COLACE) capsule 100 mg  100 mg Oral BID Meuth, Brooke A, PA-C   100 mg at 12/20/20 1006  . hydrALAZINE (APRESOLINE) tablet 25 mg  25 mg Oral Q6H Meuth, Brooke A, PA-C   25 mg at 12/20/20 0543  . insulin aspart (novoLOG) injection 0-20 Units  0-20 Units Subcutaneous TID WC Meuth, Brooke A, PA-C   11 Units at 12/20/20 0945  .  insulin aspart (novoLOG) injection 0-5 Units  0-5 Units Subcutaneous QHS Meuth, Brooke A, PA-C   4 Units at 12/19/20 2142  . latanoprost (XALATAN) 0.005 % ophthalmic solution 1 drop  1 drop Left Eye QHS Meuth, Brooke A, PA-C   1 drop at 12/19/20 2056  . MEDLINE mouth rinse  15 mL Mouth Rinse q12n4p Meuth, Brooke A, PA-C   15 mL at 12/18/20 1515  . methocarbamol (ROBAXIN) 500 mg in dextrose 5 % 50 mL IVPB  500 mg Intravenous Q6H PRN Meuth, Brooke A, PA-C      . metoCLOPramide (REGLAN) tablet 5 mg  5 mg Oral QHS Meuth, Brooke A, PA-C   5 mg at 12/19/20 2056  . metoprolol tartrate  (LOPRESSOR) injection 5 mg  5 mg Intravenous Q6H PRN Meuth, Brooke A, PA-C      . morphine 2 MG/ML injection 2 mg  2 mg Intravenous Q2H PRN Meuth, Brooke A, PA-C      . oxyCODONE (Oxy IR/ROXICODONE) immediate release tablet 5 mg  5 mg Oral Q4H PRN Meuth, Brooke A, PA-C      . polyethylene glycol (MIRALAX / GLYCOLAX) packet 17 g  17 g Oral BID Azucena Fallen, MD   17 g at 12/20/20 0945  . prochlorperazine (COMPAZINE) injection 5 mg  5 mg Intravenous Q6H PRN Meuth, Brooke A, PA-C      . traMADol (ULTRAM) tablet 50 mg  50 mg Oral Q6H PRN Meuth, Lina Sar, PA-C         Discharge Medications: Please see discharge summary for a list of discharge medications.  Relevant Imaging Results:  Relevant Lab Results:   Additional Information SS#[id number]  Geni Bers, RN

## 2020-12-20 NOTE — Progress Notes (Signed)
[facility] PROGRESS NOTE   Randy Liu XNA:[id number] DOB: 31-Jul-1951 DOA: 12/12/2020  PCP: Burman Freestone, MD  Brief History/Interval Summary: 70 y.o.WM PMHx Asthma, insulin-dependent type 2 diabetes, DM neuropathy, HTN, HLD,CKD stage III followed by nephrology, OSA on CPAP, history of PE on Eliquis, GERD, IBS.  Presented with abdominal pain nausea and vomiting.  Found to have abnormal LFTs.  He was also noted to be septic.  Concern was for cholangitis.  Patient was hospitalized.  Seen by gastroenterology.  Underwent ERCP.  General surgery is also following.  Subjective/Interval History: No acute issues or events overnight, patient much more awake alert this morning, having bowel movement and bedpan during interview, abdominal pain resolving, otherwise denies nausea vomiting diarrhea constipation headache fevers or chills.  Continues to express weakness with PT evaluation..  Assessment/Plan:  Acute cholangitis/severe sepsis secondary to Klebsiella, POA, resolved -Patient met criteria for severe sepsis on admission given abnormal LFTs -CT abdomen followed by MRCP and ultrasound of the abdomen -GI and general surgery following, appreciate insight and recommendations -ERCP 12/14/20 with notable choledocholithiasis and sphincterotomy with balloon extraction -Cholecystectomy 12/17/2020 -Completed ceftriaxone and metronidazole - shortened course per ID given source control after GB removal. -LFTs downtrending appropriately  Cholelithiasis -Status post cholecystectomy 12/17/2020; tolerated well; pain well controlled. BM function returning.  Klebsiella bacteremia -Most likely secondary to above -Completed antibiotics 12/18/20  Hyperkalemia, resolved -Follow repeat labs  History of PE - Patient on Eliquis prior to admission - resumed postoperatively - ok with surgery. - Doppler studies negative for DVT.  No clinical suspicion for recurrent PE.  Old records were reviewed per  Care Everywhere.  Looks like his PE occurred in September of 2020 in the setting of hospitalization.  He remains on it due to poor mobility and increased risk  Acute respiratory failure with hypoxia/obstructive sleep apnea, resolving Does not usually use oxygen at home- weaned off Continue nightly CPAP with home machine  History of asthma Stable  Chronic diastolic CHF Echocardiogram showed diastolic dysfunction. Seems to be stable.  Hyponatremia/hypokalemia/hypophosphatemia Resolved  Diabetes mellitus type 2 uncontrolled with hyperglycemia HbA1c 7.9.  Continue SSI.  Essential hypertension Well controlled on amlodipine hydralazine.  Acute kidney injury on chronic kidney disease stage IIIa, resolved Creatinine peaked at 1.81.  Improved and seems to be back to baseline.  Monitor urine output.  UTI with yeast, resolved Finished abx as above  QT prolongation Avoid QT prolonging medications.  Physical deconditioning/acute on chronic ambulatory dysfuction Patient mentions that he does not ambulate much at home due to history of peripheral neuropathy.  He also had severe physical deconditioning when he had COVID-19. PT continues to work with him - may benefit from SNF vs HHPT  Obesity Estimated body mass index is 38.45 kg/m as calculated from the following:   Height as of this encounter: 6' 1"  (1.854 m).   Weight as of this encounter: 132.2 kg.    DVT Prophylaxis: SCDs Code Status: Full code Family Communication: Discussed with the patient and his wife Disposition Plan:  Status is: Inpatient  Remains inpatient appropriate because:Ongoing active pain requiring inpatient pain management, IV treatments appropriate due to intensity of illness or inability to take PO and Inpatient level of care appropriate due to severity of illness   Dispo: The patient is from: Home              Anticipated d/c is to: SNF              Patient currently is  medically stable to d/c.  Currently  awaiting PT evaluation, placement for safe disposition given acutely worse ambulatory dysfunction compared to baseline.   Difficult to place patient No    Consultants: Gastroenterology.  General surgery  Procedures:   4/1 echocardiogram;Left Ventricle: LVEF= 60 to 65%.  -Grade II diastolic dysfunction (pseudonormalization).  4/2 ERCP;Choledocholithiasis s/p biliary sphincterotomy  with balloon extraction. - Ascending cholangitis - Periampullary diverticula. - Cholelithiasis with patent cystic duct. - Biopsy was performed  4/5 Lap Chole, liver biopsy, umbilical hernia repair    Medications:  Scheduled: . amLODipine  10 mg Oral Daily  . apixaban  5 mg Oral BID  . chlorhexidine  15 mL Mouth Rinse BID  . hydrALAZINE  25 mg Oral Q6H  . insulin aspart  0-20 Units Subcutaneous TID WC  . insulin aspart  0-5 Units Subcutaneous QHS  . latanoprost  1 drop Left Eye QHS  . mouth rinse  15 mL Mouth Rinse q12n4p  . metoCLOPramide  5 mg Oral QHS  . polyethylene glycol  17 g Oral BID   Continuous: . methocarbamol (ROBAXIN) IV     SNK:[id number] **OR** acetaminophen, albuterol, methocarbamol (ROBAXIN) IV, metoprolol tartrate, morphine injection, oxyCODONE, prochlorperazine, traMADol   Objective:  Vital Signs  Vitals:   12/19/20 2139 12/20/20 0500 12/20/20 0527 12/20/20 0635  BP: 139/62  135/69   Pulse: 89  80   Resp: 18  18   Temp: 98.1 F (36.7 C)  97.8 F (36.6 C)   TempSrc: Oral  Oral   SpO2: 93%  94%   Weight:  132.1 kg  132.2 kg  Height:        Intake/Output Summary (Last 24 hours) at 12/20/2020 0833 Last data filed at 12/20/2020 0635 Gross per 24 hour  Intake 960 ml  Output 950 ml  Net 10 ml   Filed Weights   12/19/20 0500 12/20/20 0500 12/20/20 0635  Weight: 133.8 kg 132.1 kg 132.2 kg    General appearance: Awake alert.  In no  distress Resp: Clear to auscultation bilaterally.  Normal effort Cardio: S1-S2 is normal regular.  No S3-S4.  No rubs murmurs or bruit GI: Abdomen is soft.  Nontender nondistended.  Bowel sounds are present normal.  No masses organomegaly Extremities: Mild edema bilateral lower extremities Neurologic: Alert and oriented x3.  No focal neurological deficits.  Extremely hard of hearing bilaterally    Lab Results:  Data Reviewed: I have personally reviewed following labs and imaging studies  CBC: Recent Labs  Lab 12/14/20 0603 12/15/20 0233 12/16/20 0349 12/17/20 0454 12/18/20 0355 12/19/20 0358 12/20/20 0430  WBC 6.1 7.7 7.3 7.5 7.9 8.0 6.7  NEUTROABS 4.6 5.3 4.8 4.5 5.5  --   --   HGB 12.4* 12.2* 12.0* 12.2* 12.5* 11.7* 11.3*  HCT 38.8* 39.3 37.6* 38.5* 41.0 37.1* 37.2*  MCV 99.5 101.8* 98.9 99.2 101.5* 100.8* 102.5*  PLT 138* 137* 165 191 205 225 024    Basic Metabolic Panel: Recent Labs  Lab 12/16/20 0349 12/17/20 0454 12/18/20 0355 12/18/20 1337 12/19/20 0358 12/20/20 0430  NA 132* 135 134* 134* 133*  --   K 3.3* 3.8 5.8* 4.2 3.7  --   CL 102 103 99 99 100  --   CO2 21* 20* 20* 21* 21*  --   GLUCOSE 149* 183* 266* 311* 270*  --   BUN 15 11 21  27* 31*  --   CREATININE 0.77 0.84 1.01 1.12 1.04  --   CALCIUM 8.8* 9.2 9.5 9.3  9.2  --   MG 1.7 2.0 1.9  --  1.8 1.7  PHOS 1.2* 2.0* 3.3  --  2.7 3.1    GFR: Estimated Creatinine Clearance: 92.9 mL/min (by C-G formula based on SCr of 1.04 mg/dL).  Liver Function Tests: Recent Labs  Lab 12/14/20 0603 12/15/20 0233 12/16/20 0349 12/17/20 0454 12/18/20 0355  AST 132* 182* 130* 78* 42*  ALT 127* 137* 121* 95* 72*  ALKPHOS 66 90 107 124 121  BILITOT 5.0* 6.1* 4.8* 3.4* 2.6*  PROT 5.9* 5.8* 5.6* 5.9* 6.2*  ALBUMIN 2.9* 2.7* 2.3* 2.4* 2.5*    No results for input(s): LIPASE, AMYLASE in the last 168 hours.  Coagulation Profile: No results for input(s): INR, PROTIME in the last 168 hours.   CBG: Recent Labs   Lab 12/19/20 0814 12/19/20 1143 12/19/20 1718 12/19/20 2105 12/20/20 0759  GLUCAP 221* 245* 352* 316* 269*    Anemia Panel: No results for input(s): VITAMINB12, FOLATE, FERRITIN, TIBC, IRON, RETICCTPCT in the last 72 hours.  Recent Results (from the past 240 hour(s))  Culture, blood (Routine x 2)     Status: Abnormal   Collection Time: 12/12/20  7:45 PM   Specimen: BLOOD  Result Value Ref Range Status   Specimen Description   Final    BLOOD RIGHT ANTECUBITAL Performed at [facility], [address] [place], [place] [address]    Special Requests   Final    BOTTLES DRAWN AEROBIC AND ANAEROBIC Blood Culture adequate volume Performed at [facility], [address] [place], [place] [address]    Culture  Setup Time   Final    GRAM NEGATIVE RODS IN BOTH AEROBIC AND ANAEROBIC BOTTLES CRITICAL RESULT CALLED TO, READ BACK BY AND VERIFIED WITHGuadlupe Spanish @ 6948 12/13/20 EB Performed at [facility], [place] [address], [place], [place] [address]    Culture KLEBSIELLA PNEUMONIAE (A)  Final   Report Status 12/15/2020 FINAL  Final   Organism ID, Bacteria KLEBSIELLA PNEUMONIAE  Final      Susceptibility   Klebsiella pneumoniae - MIC*    AMPICILLIN >=32 RESISTANT Resistant     CEFAZOLIN <=4 SENSITIVE Sensitive     CEFEPIME <=0.12 SENSITIVE Sensitive     CEFTAZIDIME <=1 SENSITIVE Sensitive     CEFTRIAXONE <=0.25 SENSITIVE Sensitive     CIPROFLOXACIN <=0.25 SENSITIVE Sensitive     GENTAMICIN <=1 SENSITIVE Sensitive     IMIPENEM <=0.25 SENSITIVE Sensitive     TRIMETH/SULFA <=20 SENSITIVE Sensitive     AMPICILLIN/SULBACTAM 8 SENSITIVE Sensitive     PIP/TAZO <=4 SENSITIVE Sensitive     * KLEBSIELLA PNEUMONIAE  Blood Culture ID Panel (Reflexed)     Status: Abnormal   Collection Time: 12/12/20  7:45 PM  Result Value Ref Range Status   Enterococcus faecalis NOT DETECTED NOT DETECTED Final   Enterococcus Faecium NOT DETECTED NOT DETECTED Final   Listeria  monocytogenes NOT DETECTED NOT DETECTED Final   Staphylococcus species NOT DETECTED NOT DETECTED Final   Staphylococcus aureus (BCID) NOT DETECTED NOT DETECTED Final   Staphylococcus epidermidis NOT DETECTED NOT DETECTED Final   Staphylococcus lugdunensis NOT DETECTED NOT DETECTED Final   Streptococcus species NOT DETECTED NOT DETECTED Final   Streptococcus agalactiae NOT DETECTED NOT DETECTED Final   Streptococcus pneumoniae NOT DETECTED NOT DETECTED Final   Streptococcus pyogenes NOT DETECTED NOT DETECTED Final   A.calcoaceticus-baumannii NOT DETECTED NOT DETECTED Final   Bacteroides fragilis NOT DETECTED NOT DETECTED  Final   Enterobacterales DETECTED (A) NOT DETECTED Final    Comment: Enterobacterales represent a large order of gram negative bacteria, not a single organism. CRITICAL RESULT CALLED TO, READ BACK BY AND VERIFIED WITH: N GLOGOVAC @ 9191 12/13/20 EB    Enterobacter cloacae complex NOT DETECTED NOT DETECTED Final   Escherichia coli NOT DETECTED NOT DETECTED Final   Klebsiella aerogenes NOT DETECTED NOT DETECTED Final   Klebsiella oxytoca NOT DETECTED NOT DETECTED Final   Klebsiella pneumoniae DETECTED (A) NOT DETECTED Final    Comment: CRITICAL RESULT CALLED TO, READ BACK BY AND VERIFIED WITH: N GLOGOVAC @ 1153 12/13/20 EB    Proteus species NOT DETECTED NOT DETECTED Final   Salmonella species NOT DETECTED NOT DETECTED Final   Serratia marcescens NOT DETECTED NOT DETECTED Final   Haemophilus influenzae NOT DETECTED NOT DETECTED Final   Neisseria meningitidis NOT DETECTED NOT DETECTED Final   Pseudomonas aeruginosa NOT DETECTED NOT DETECTED Final   Stenotrophomonas maltophilia NOT DETECTED NOT DETECTED Final   Candida albicans NOT DETECTED NOT DETECTED Final   Candida auris NOT DETECTED NOT DETECTED Final   Candida glabrata NOT DETECTED NOT DETECTED Final   Candida krusei NOT DETECTED NOT DETECTED Final   Candida parapsilosis NOT DETECTED NOT DETECTED Final   Candida  tropicalis NOT DETECTED NOT DETECTED Final   Cryptococcus neoformans/gattii NOT DETECTED NOT DETECTED Final   CTX-M ESBL NOT DETECTED NOT DETECTED Final   Carbapenem resistance IMP NOT DETECTED NOT DETECTED Final   Carbapenem resistance KPC NOT DETECTED NOT DETECTED Final   Carbapenem resistance NDM NOT DETECTED NOT DETECTED Final   Carbapenem resist OXA 48 LIKE NOT DETECTED NOT DETECTED Final   Carbapenem resistance VIM NOT DETECTED NOT DETECTED Final    Comment: Performed at [facility], [address], [place], [place] [address]  Resp Panel by RT-PCR (Flu A&B, Covid) Nasopharyngeal Swab     Status: None   Collection Time: 12/12/20  8:15 PM   Specimen: Nasopharyngeal Swab; Nasopharyngeal(NP) swabs in vial transport medium  Result Value Ref Range Status   SARS Coronavirus 2 by RT PCR NEGATIVE NEGATIVE Final    Comment: (NOTE) SARS-CoV-2 target nucleic acids are NOT DETECTED.  The SARS-CoV-2 RNA is generally detectable in upper respiratory specimens during the acute phase of infection. The lowest concentration of SARS-CoV-2 viral copies this assay can detect is 138 copies/mL. A negative result does not preclude SARS-Cov-2 infection and should not be used as the sole basis for treatment or other patient management decisions. A negative result may occur with  improper specimen collection/handling, submission of specimen other than nasopharyngeal swab, presence of viral mutation(s) within the areas targeted by this assay, and inadequate number of viral copies(<138 copies/mL). A negative result must be combined with clinical observations, patient history, and epidemiological information. The expected result is Negative.  Fact Sheet for Patients:  EntrepreneurPulse.com.au  Fact Sheet for Healthcare Providers:  IncredibleEmployment.be  This test is no t yet approved or cleared by the [place] FDA and  has been authorized for detection  and/or diagnosis of SARS-CoV-2 by FDA under an Emergency Use Authorization (EUA). This EUA will remain  in effect (meaning this test can be used) for the duration of the COVID-19 declaration under Section 564(b)(1) of the Act, 21 U.S.C.section 360bbb-3(b)(1), unless the authorization is terminated  or revoked sooner.       Influenza A by PCR NEGATIVE NEGATIVE Final   Influenza B by PCR NEGATIVE NEGATIVE Final  Comment: (NOTE) The Xpert Xpress SARS-CoV-2/FLU/RSV plus assay is intended as an aid in the diagnosis of influenza from Nasopharyngeal swab specimens and should not be used as a sole basis for treatment. Nasal washings and aspirates are unacceptable for Xpert Xpress SARS-CoV-2/FLU/RSV testing.  Fact Sheet for Patients: EntrepreneurPulse.com.au  Fact Sheet for Healthcare Providers: IncredibleEmployment.be  This test is not yet approved or cleared by the [place] FDA and has been authorized for detection and/or diagnosis of SARS-CoV-2 by FDA under an Emergency Use Authorization (EUA). This EUA will remain in effect (meaning this test can be used) for the duration of the COVID-19 declaration under Section 564(b)(1) of the Act, 21 U.S.C. section 360bbb-3(b)(1), unless the authorization is terminated or revoked.  Performed at [facility], [address] [place], [place] [address]   Culture, blood (Routine x 2)     Status: Abnormal   Collection Time: 12/12/20  9:20 PM   Specimen: BLOOD  Result Value Ref Range Status   Specimen Description   Final    BLOOD Blood Culture adequate volume Performed at [facility], [address] [place], [place] [address]    Special Requests   Final    BOTTLES DRAWN AEROBIC AND ANAEROBIC LEFT ANTECUBITAL Performed at [facility], [address] [place], [place] [address]    Culture  Setup Time   Final    GRAM NEGATIVE RODS IN BOTH AEROBIC AND ANAEROBIC  BOTTLES CRITICAL VALUE NOTED.  VALUE IS CONSISTENT WITH PREVIOUSLY REPORTED AND CALLED VALUE.    Culture (A)  Final    KLEBSIELLA PNEUMONIAE SUSCEPTIBILITIES PERFORMED ON PREVIOUS CULTURE WITHIN THE LAST 5 DAYS. Performed at [facility], [place] [address], [place], [place] [address]    Report Status 12/15/2020 FINAL  Final  MRSA PCR Screening     Status: None   Collection Time: 12/13/20  1:29 AM   Specimen: Nasopharyngeal  Result Value Ref Range Status   MRSA by PCR NEGATIVE NEGATIVE Final    Comment:        The GeneXpert MRSA Assay (FDA approved for NASAL specimens only), is one component of a comprehensive MRSA colonization surveillance program. It is not intended to diagnose MRSA infection nor to guide or monitor treatment for MRSA infections. Performed at [facility], [place] [address] [place], [place] [address]   Urine Culture     Status: Abnormal   Collection Time: 12/14/20 12:39 AM   Specimen: Urine, Random  Result Value Ref Range Status   Specimen Description   Final    URINE, RANDOM Performed at [facility] [address], [place], [place] [address]    Special Requests   Final    NONE Performed at [facility], [place] [address] [place],  [address]    Culture 40,000 COLONIES/mL YEAST (A)  Final   Report Status 12/15/2020 FINAL  Final      Radiology Studies: No results found.  Time spent: 30 minutes   LOS: 7 days   [facility] Pager on [email or web address]  12/20/2020, 8:33 AM

## 2020-12-21 DIAGNOSIS — K8309 Other cholangitis: Secondary | ICD-10-CM | POA: Diagnosis not present

## 2020-12-21 LAB — GLUCOSE, CAPILLARY
Glucose-Capillary: 304 mg/dL — ABNORMAL HIGH (ref 70–99)
Glucose-Capillary: 329 mg/dL — ABNORMAL HIGH (ref 70–99)
Glucose-Capillary: 285 mg/dL — ABNORMAL HIGH (ref 70–99)
Glucose-Capillary: 217 mg/dL — ABNORMAL HIGH (ref 70–99)

## 2020-12-21 MED ORDER — PANTOPRAZOLE SODIUM 40 MG PO TBEC
40.0000 mg | DELAYED_RELEASE_TABLET | Freq: Every day | ORAL | Status: DC
  Administered 2020-12-21 – 2020-12-23 (×3): 40 mg via ORAL
  Filled 2020-12-21 (×3): qty 1

## 2020-12-21 MED ORDER — INSULIN ASPART PROT & ASPART (70-30 MIX) 100 UNIT/ML ~~LOC~~ SUSP
30.0000 [IU] | Freq: Two times a day (BID) | SUBCUTANEOUS | Status: DC
  Administered 2020-12-21 – 2020-12-22 (×3): 30 [IU] via SUBCUTANEOUS
  Filled 2020-12-21: qty 10

## 2020-12-21 MED ORDER — MONTELUKAST SODIUM 10 MG PO TABS
10.0000 mg | ORAL_TABLET | Freq: Every day | ORAL | Status: DC
  Administered 2020-12-21 – 2020-12-22 (×2): 10 mg via ORAL
  Filled 2020-12-21 (×2): qty 1

## 2020-12-21 MED ORDER — CARVEDILOL 12.5 MG PO TABS
12.5000 mg | ORAL_TABLET | Freq: Two times a day (BID) | ORAL | Status: DC
  Administered 2020-12-21 – 2020-12-23 (×4): 12.5 mg via ORAL
  Filled 2020-12-21 (×4): qty 1

## 2020-12-21 MED ORDER — CALCIUM CARBONATE ANTACID 500 MG PO CHEW
1.0000 | CHEWABLE_TABLET | Freq: Once | ORAL | Status: AC
  Administered 2020-12-21: 200 mg via ORAL
  Filled 2020-12-21: qty 1

## 2020-12-21 MED ORDER — BENZONATATE 100 MG PO CAPS
200.0000 mg | ORAL_CAPSULE | Freq: Three times a day (TID) | ORAL | Status: DC | PRN
  Administered 2020-12-21 – 2020-12-22 (×5): 200 mg via ORAL
  Filled 2020-12-21 (×5): qty 2

## 2020-12-21 NOTE — Progress Notes (Signed)
[facility] PROGRESS NOTE   Randy Liu WLS:[id number] DOB: 1951/03/29 DOA: 12/12/2020  PCP: Burman Freestone, MD  Brief History/Interval Summary: 70 y.o.WM PMHx Asthma, insulin-dependent type 2 diabetes, DM neuropathy, HTN, HLD,CKD stage III followed by nephrology, OSA on CPAP, history of PE on Eliquis, GERD, IBS.  Presented with abdominal pain nausea and vomiting.  Found to have abnormal LFTs.  He was also noted to be septic.  Concern was for cholangitis.  Patient was hospitalized.  Seen by gastroenterology.  Underwent ERCP.  General surgery is also following.  Subjective/Interval History: No acute issues or events overnight, patient much more awake alert this morning, having bowel movement and bedpan during interview, abdominal pain resolving, otherwise denies nausea vomiting diarrhea constipation headache fevers or chills.  Continues to express weakness with PT evaluation..  Assessment/Plan:  Acute cholangitis/severe sepsis secondary to Klebsiella, POA, resolved -Patient met criteria for severe sepsis on admission given abnormal LFTs -CT abdomen followed by MRCP and ultrasound of the abdomen -GI and general surgery following, appreciate insight and recommendations -ERCP 12/14/20 with notable choledocholithiasis and sphincterotomy with balloon extraction -Cholecystectomy 12/17/2020 -Completed ceftriaxone and metronidazole - shortened course per ID given source control after GB removal. -LFTs downtrending appropriately; no longer following given stability  Cholelithiasis -Status post cholecystectomy 12/17/2020; tolerated well; pain well controlled. BM function returning.  Klebsiella bacteremia -Most likely secondary to above -Completed antibiotics 12/18/20  Hyperkalemia, resolved -Follow repeat labs  History of PE - Patient on Eliquis prior to admission - resumed postoperatively - ok with surgery. - Doppler studies negative for DVT.  No clinical suspicion for recurrent  PE.  Old records were reviewed per Care Everywhere.  Looks like his PE occurred in September of 2020 in the setting of hospitalization.  He remains on it due to poor mobility and increased risk  Acute respiratory failure with hypoxia/obstructive sleep apnea, resolving Does not usually use oxygen at home- weaned off Continue nightly CPAP with home machine  History of asthma Stable  Chronic diastolic CHF Echocardiogram showed diastolic dysfunction. Seems to be stable.  Hyponatremia/hypokalemia/hypophosphatemia Resolved  Diabetes mellitus type 2 uncontrolled with hyperglycemia HbA1c 7.9.  Continue SSI/hypoglycemic protocol Resume home 70/30 at decreased dose -30 units twice daily  Essential hypertension Well controlled on amlodipine hydralazine.  Acute kidney injury on chronic kidney disease stage IIIa, resolved Creatinine peaked at 1.81.  Improved and seems to be back to baseline.  Monitor urine output.  UTI with yeast, resolved Finished abx as above  QT prolongation Avoid QT prolonging medications.  Physical deconditioning/acute on chronic ambulatory dysfuction Patient mentions that he does not ambulate much at home due to history of peripheral neuropathy.  He also had severe physical deconditioning when he had COVID-19. PT continues to work with him - may benefit from SNF vs HHPT  Obesity Estimated body mass index is 38.36 kg/m as calculated from the following:   Height as of this encounter: 6' 1"  (1.854 m).   Weight as of this encounter: 131.9 kg.    DVT Prophylaxis: SCDs Code Status: Full code Family Communication: Discussed with the patient and his wife Disposition Plan:  Status is: Inpatient  Remains inpatient appropriate because:Ongoing active pain requiring inpatient pain management, IV treatments appropriate due to intensity of illness or inability to take PO and Inpatient level of care appropriate due to severity of illness   Dispo: The patient is from:  Home              Anticipated d/c is to:  SNF              Patient currently is medically stable to d/c.  Currently awaiting SNF placement, insurance approval and bed availability.   Difficult to place patient No  Consultants: Gastroenterology.  General surgery  Procedures:   4/1 echocardiogram;Left Ventricle: LVEF= 60 to 65%.  -Grade II diastolic dysfunction (pseudonormalization).  4/2 ERCP;Choledocholithiasis s/p biliary sphincterotomy  with balloon extraction. - Ascending cholangitis - Periampullary diverticula. - Cholelithiasis with patent cystic duct. - Biopsy was performed  4/5 Lap Chole, liver biopsy, umbilical hernia repair   Medications:  Scheduled: . amLODipine  10 mg Oral Daily  . apixaban  5 mg Oral BID  . chlorhexidine  15 mL Mouth Rinse BID  . docusate sodium  100 mg Oral BID  . hydrALAZINE  25 mg Oral Q6H  . insulin aspart  0-20 Units Subcutaneous TID WC  . insulin aspart  0-5 Units Subcutaneous QHS  . latanoprost  1 drop Left Eye QHS  . mouth rinse  15 mL Mouth Rinse q12n4p  . metoCLOPramide  5 mg Oral QHS  . polyethylene glycol  17 g Oral BID   Continuous: . methocarbamol (ROBAXIN) IV     JJK:[id number] **OR** acetaminophen, albuterol, methocarbamol (ROBAXIN) IV, metoprolol tartrate, morphine injection, oxyCODONE, prochlorperazine, traMADol   Objective:  Vital Signs  Vitals:   12/20/20 1317 12/20/20 2030 12/20/20 2345 12/21/20 0500  BP: 130/61 (!) 145/59    Pulse: 86 87    Resp: 17  19   Temp: 97.8 F (36.6 C) 98.9 F (37.2 C)    TempSrc: Oral Oral    SpO2: 91% 90%    Weight:    131.9 kg  Height:        Intake/Output Summary (Last 24 hours) at 12/21/2020 0750 Last data filed at 12/21/2020 0400 Gross per 24 hour  Intake 240 ml  Output 3175 ml  Net -2935 ml   Filed Weights   12/20/20 0500 12/20/20 0635  12/21/20 0500  Weight: 132.1 kg 132.2 kg 131.9 kg    General appearance: Awake alert.  In no distress Resp: Clear to auscultation bilaterally.  Normal effort Cardio: S1-S2 is normal regular.  No S3-S4.  No rubs murmurs or bruit GI: Abdomen is soft.  Nontender nondistended.  Bowel sounds are present normal.  No masses organomegaly Extremities: Mild edema bilateral lower extremities Neurologic: Alert and oriented x3.  No focal neurological deficits.  Extremely hard of hearing bilaterally    Lab Results:  Data Reviewed: I have personally reviewed following labs and imaging studies  CBC: Recent Labs  Lab 12/15/20 0233 12/16/20 0349 12/17/20 0454 12/18/20 0355 12/19/20 0358 12/20/20 0430  WBC 7.7 7.3 7.5 7.9 8.0 6.7  NEUTROABS 5.3 4.8 4.5 5.5  --   --   HGB 12.2* 12.0* 12.2* 12.5* 11.7* 11.3*  HCT 39.3 37.6* 38.5* 41.0 37.1* 37.2*  MCV 101.8* 98.9 99.2 101.5* 100.8* 102.5*  PLT 137* 165 191 205 225 967    Basic Metabolic Panel: Recent Labs  Lab 12/16/20 0349 12/17/20 0454 12/18/20 0355 12/18/20 1337 12/19/20 0358 12/20/20 0430  NA 132* 135 134* 134* 133*  --   K 3.3* 3.8 5.8* 4.2 3.7  --   CL 102 103 99 99 100  --   CO2 21* 20* 20* 21* 21*  --   GLUCOSE 149* 183* 266* 311* 270*  --   BUN 15 11 21  27* 31*  --   CREATININE 0.77 0.84 1.01 1.12 1.04  --  CALCIUM 8.8* 9.2 9.5 9.3 9.2  --   MG 1.7 2.0 1.9  --  1.8 1.7  PHOS 1.2* 2.0* 3.3  --  2.7 3.1    GFR: Estimated Creatinine Clearance: 92.8 mL/min (by C-G formula based on SCr of 1.04 mg/dL).  Liver Function Tests: Recent Labs  Lab 12/15/20 0233 12/16/20 0349 12/17/20 0454 12/18/20 0355  AST 182* 130* 78* 42*  ALT 137* 121* 95* 72*  ALKPHOS 90 107 124 121  BILITOT 6.1* 4.8* 3.4* 2.6*  PROT 5.8* 5.6* 5.9* 6.2*  ALBUMIN 2.7* 2.3* 2.4* 2.5*    No results for input(s): LIPASE, AMYLASE in the last 168 hours.  Coagulation Profile: No results for input(s): INR, PROTIME in the last 168  hours.   CBG: Recent Labs  Lab 12/20/20 0759 12/20/20 1224 12/20/20 1729 12/20/20 2105 12/21/20 0747  GLUCAP 269* 334* 254* 248* 304*    Anemia Panel: No results for input(s): VITAMINB12, FOLATE, FERRITIN, TIBC, IRON, RETICCTPCT in the last 72 hours.  Recent Results (from the past 240 hour(s))  Culture, blood (Routine x 2)     Status: Abnormal   Collection Time: 12/12/20  7:45 PM   Specimen: BLOOD  Result Value Ref Range Status   Specimen Description   Final    BLOOD RIGHT ANTECUBITAL Performed at [facility], [address] [place], [place] [address]    Special Requests   Final    BOTTLES DRAWN AEROBIC AND ANAEROBIC Blood Culture adequate volume Performed at [facility], [address] [place], [place] [address]    Culture  Setup Time   Final    GRAM NEGATIVE RODS IN BOTH AEROBIC AND ANAEROBIC BOTTLES CRITICAL RESULT CALLED TO, READ BACK BY AND VERIFIED WITHGuadlupe Spanish @ 0263 12/13/20 EB Performed at [facility], [place] [address], [place], [place] [address]    Culture KLEBSIELLA PNEUMONIAE (A)  Final   Report Status 12/15/2020 FINAL  Final   Organism ID, Bacteria KLEBSIELLA PNEUMONIAE  Final      Susceptibility   Klebsiella pneumoniae - MIC*    AMPICILLIN >=32 RESISTANT Resistant     CEFAZOLIN <=4 SENSITIVE Sensitive     CEFEPIME <=0.12 SENSITIVE Sensitive     CEFTAZIDIME <=1 SENSITIVE Sensitive     CEFTRIAXONE <=0.25 SENSITIVE Sensitive     CIPROFLOXACIN <=0.25 SENSITIVE Sensitive     GENTAMICIN <=1 SENSITIVE Sensitive     IMIPENEM <=0.25 SENSITIVE Sensitive     TRIMETH/SULFA <=20 SENSITIVE Sensitive     AMPICILLIN/SULBACTAM 8 SENSITIVE Sensitive     PIP/TAZO <=4 SENSITIVE Sensitive     * KLEBSIELLA PNEUMONIAE  Blood Culture ID Panel (Reflexed)     Status: Abnormal   Collection Time: 12/12/20  7:45 PM  Result Value Ref Range Status   Enterococcus faecalis NOT DETECTED NOT DETECTED Final   Enterococcus Faecium NOT DETECTED NOT  DETECTED Final   Listeria monocytogenes NOT DETECTED NOT DETECTED Final   Staphylococcus species NOT DETECTED NOT DETECTED Final   Staphylococcus aureus (BCID) NOT DETECTED NOT DETECTED Final   Staphylococcus epidermidis NOT DETECTED NOT DETECTED Final   Staphylococcus lugdunensis NOT DETECTED NOT DETECTED Final   Streptococcus species NOT DETECTED NOT DETECTED Final   Streptococcus agalactiae NOT DETECTED NOT DETECTED Final   Streptococcus pneumoniae NOT DETECTED NOT DETECTED Final   Streptococcus pyogenes NOT DETECTED NOT DETECTED Final   A.calcoaceticus-baumannii NOT DETECTED NOT DETECTED Final   Bacteroides fragilis NOT DETECTED NOT DETECTED Final  Enterobacterales DETECTED (A) NOT DETECTED Final    Comment: Enterobacterales represent a large order of gram negative bacteria, not a single organism. CRITICAL RESULT CALLED TO, READ BACK BY AND VERIFIED WITH: N GLOGOVAC @ 7062 12/13/20 EB    Enterobacter cloacae complex NOT DETECTED NOT DETECTED Final   Escherichia coli NOT DETECTED NOT DETECTED Final   Klebsiella aerogenes NOT DETECTED NOT DETECTED Final   Klebsiella oxytoca NOT DETECTED NOT DETECTED Final   Klebsiella pneumoniae DETECTED (A) NOT DETECTED Final    Comment: CRITICAL RESULT CALLED TO, READ BACK BY AND VERIFIED WITH: N GLOGOVAC @ 1153 12/13/20 EB    Proteus species NOT DETECTED NOT DETECTED Final   Salmonella species NOT DETECTED NOT DETECTED Final   Serratia marcescens NOT DETECTED NOT DETECTED Final   Haemophilus influenzae NOT DETECTED NOT DETECTED Final   Neisseria meningitidis NOT DETECTED NOT DETECTED Final   Pseudomonas aeruginosa NOT DETECTED NOT DETECTED Final   Stenotrophomonas maltophilia NOT DETECTED NOT DETECTED Final   Candida albicans NOT DETECTED NOT DETECTED Final   Candida auris NOT DETECTED NOT DETECTED Final   Candida glabrata NOT DETECTED NOT DETECTED Final   Candida krusei NOT DETECTED NOT DETECTED Final   Candida parapsilosis NOT DETECTED NOT  DETECTED Final   Candida tropicalis NOT DETECTED NOT DETECTED Final   Cryptococcus neoformans/gattii NOT DETECTED NOT DETECTED Final   CTX-M ESBL NOT DETECTED NOT DETECTED Final   Carbapenem resistance IMP NOT DETECTED NOT DETECTED Final   Carbapenem resistance KPC NOT DETECTED NOT DETECTED Final   Carbapenem resistance NDM NOT DETECTED NOT DETECTED Final   Carbapenem resist OXA 48 LIKE NOT DETECTED NOT DETECTED Final   Carbapenem resistance VIM NOT DETECTED NOT DETECTED Final    Comment: Performed at [facility], [address], [place], [place] [address]  Resp Panel by RT-PCR (Flu A&B, Covid) Nasopharyngeal Swab     Status: None   Collection Time: 12/12/20  8:15 PM   Specimen: Nasopharyngeal Swab; Nasopharyngeal(NP) swabs in vial transport medium  Result Value Ref Range Status   SARS Coronavirus 2 by RT PCR NEGATIVE NEGATIVE Final    Comment: (NOTE) SARS-CoV-2 target nucleic acids are NOT DETECTED.  The SARS-CoV-2 RNA is generally detectable in upper respiratory specimens during the acute phase of infection. The lowest concentration of SARS-CoV-2 viral copies this assay can detect is 138 copies/mL. A negative result does not preclude SARS-Cov-2 infection and should not be used as the sole basis for treatment or other patient management decisions. A negative result may occur with  improper specimen collection/handling, submission of specimen other than nasopharyngeal swab, presence of viral mutation(s) within the areas targeted by this assay, and inadequate number of viral copies(<138 copies/mL). A negative result must be combined with clinical observations, patient history, and epidemiological information. The expected result is Negative.  Fact Sheet for Patients:  EntrepreneurPulse.com.au  Fact Sheet for Healthcare Providers:  IncredibleEmployment.be  This test is no t yet approved or cleared by the [place] FDA and  has been  authorized for detection and/or diagnosis of SARS-CoV-2 by FDA under an Emergency Use Authorization (EUA). This EUA will remain  in effect (meaning this test can be used) for the duration of the COVID-19 declaration under Section 564(b)(1) of the Act, 21 U.S.C.section 360bbb-3(b)(1), unless the authorization is terminated  or revoked sooner.       Influenza A by PCR NEGATIVE NEGATIVE Final   Influenza B by PCR NEGATIVE NEGATIVE Final    Comment: (NOTE)  The Xpert Xpress SARS-CoV-2/FLU/RSV plus assay is intended as an aid in the diagnosis of influenza from Nasopharyngeal swab specimens and should not be used as a sole basis for treatment. Nasal washings and aspirates are unacceptable for Xpert Xpress SARS-CoV-2/FLU/RSV testing.  Fact Sheet for Patients: EntrepreneurPulse.com.au  Fact Sheet for Healthcare Providers: IncredibleEmployment.be  This test is not yet approved or cleared by the [place] FDA and has been authorized for detection and/or diagnosis of SARS-CoV-2 by FDA under an Emergency Use Authorization (EUA). This EUA will remain in effect (meaning this test can be used) for the duration of the COVID-19 declaration under Section 564(b)(1) of the Act, 21 U.S.C. section 360bbb-3(b)(1), unless the authorization is terminated or revoked.  Performed at [facility], [address] [place], [place] [address]   Culture, blood (Routine x 2)     Status: Abnormal   Collection Time: 12/12/20  9:20 PM   Specimen: BLOOD  Result Value Ref Range Status   Specimen Description   Final    BLOOD Blood Culture adequate volume Performed at [facility], [address] [place], [place] [address]    Special Requests   Final    BOTTLES DRAWN AEROBIC AND ANAEROBIC LEFT ANTECUBITAL Performed at [facility] [address] [place], [place] [address]    Culture  Setup Time   Final    GRAM NEGATIVE RODS IN BOTH  AEROBIC AND ANAEROBIC BOTTLES CRITICAL VALUE NOTED.  VALUE IS CONSISTENT WITH PREVIOUSLY REPORTED AND CALLED VALUE.    Culture (A)  Final    KLEBSIELLA PNEUMONIAE SUSCEPTIBILITIES PERFORMED ON PREVIOUS CULTURE WITHIN THE LAST 5 DAYS. Performed at [facility], [place] [address], [place], [place] [address]    Report Status 12/15/2020 FINAL  Final  MRSA PCR Screening     Status: None   Collection Time: 12/13/20  1:29 AM   Specimen: Nasopharyngeal  Result Value Ref Range Status   MRSA by PCR NEGATIVE NEGATIVE Final    Comment:        The GeneXpert MRSA Assay (FDA approved for NASAL specimens only), is one component of a comprehensive MRSA colonization surveillance program. It is not intended to diagnose MRSA infection nor to guide or monitor treatment for MRSA infections. Performed at [facility], [place] [address] [place], [place] [address]   Urine Culture     Status: Abnormal   Collection Time: 12/14/20 12:39 AM   Specimen: Urine, Random  Result Value Ref Range Status   Specimen Description   Final    URINE, RANDOM Performed at [facility] [address], [place], [place] [address]    Special Requests   Final    NONE Performed at [facility], [place] [address] [place], [place] [address]    Culture 40,000 COLONIES/mL YEAST (A)  Final   Report Status 12/15/2020 FINAL  Final      Radiology Studies: No results found.  Time spent: 30 minutes   LOS: 8 days   [facility] Pager on [email or web address]  12/21/2020, 7:50 AM

## 2020-12-22 DIAGNOSIS — K8309 Other cholangitis: Secondary | ICD-10-CM | POA: Diagnosis not present

## 2020-12-22 LAB — RESP PANEL BY RT-PCR (FLU A&B, COVID) ARPGX2
Influenza A by PCR: NEGATIVE
Influenza B by PCR: NEGATIVE
SARS Coronavirus 2 by RT PCR: NEGATIVE

## 2020-12-22 LAB — GLUCOSE, CAPILLARY
Glucose-Capillary: 243 mg/dL — ABNORMAL HIGH (ref 70–99)
Glucose-Capillary: 301 mg/dL — ABNORMAL HIGH (ref 70–99)
Glucose-Capillary: 168 mg/dL — ABNORMAL HIGH (ref 70–99)
Glucose-Capillary: 123 mg/dL — ABNORMAL HIGH (ref 70–99)

## 2020-12-22 NOTE — TOC Progression Note (Addendum)
Transition of Care [facility]) - Progression Note    Patient Details  Name: Randy Liu MRN: [id number] Date of Birth: March 29, 1951  [facility]) CM/SW Contact  Marina Goodell Phone Number: [phone] 12/22/2020, 11:02 AM  Clinical Narrative:     CSW received secure chat from Attending stating he spoke with patient's [place],[name] (Spouse) [phone].  Attending updated Ms. Bunt and stated the patient is medically stable to discharge and has safe discharge available.  CSW spoke with Ms. Gladden who stated she had started Medicare discharge appeal process.    Expected Discharge Plan: Home/Self Care Barriers to Discharge: Continued Medical Work up  Expected Discharge Plan and Services Expected Discharge Plan: Home/Self Care   Discharge Planning Services: CM Consult   Living arrangements for the past 2 months: Single Family Home Expected Discharge Date: 12/22/20                                     Social Determinants of Health (SDOH) Interventions    Readmission Risk Interventions No flowsheet data found.

## 2020-12-22 NOTE — Plan of Care (Signed)
  Problem: Health Behavior/Discharge Planning: Goal: Ability to manage health-related needs will improve Outcome: Progressing   Problem: Clinical Measurements: Goal: Respiratory complications will improve Outcome: Progressing   Problem: Coping: Goal: Level of anxiety will decrease Outcome: Progressing   Problem: Pain Managment: Goal: General experience of comfort will improve Outcome: Progressing   

## 2020-12-22 NOTE — TOC Progression Note (Signed)
Transition of Care [facility]) - Progression Note    Patient Details  Name: Randy Liu MRN: [id number] Date of Birth: 1951-04-09  Transition of Care [facility]) CM/SW Contact  Randy Heritage, RN Phone Number: 12/22/2020, 2:06 PM  Clinical Narrative:    CM received call from spouse Randy Liu, stating she has spoken with [facility] rep Randy Liu who states that patient referral to facility indicated plan for dc home.  CM spoke with Randy Liu and clarified, Randy Liu was looking at H&P which identified that patient came from home, however dc plan has changed since patient has been hospitalized and is now deconditioned, needs short term rehab before returning home.  CM submitted therapy notes to facility to support the need for SNF for rehab.  Randy Liu also indicates that patient is on Invokana, which may be a barrier to facility being able to extend bed offer due to the cost of the medication.  CM questioned if family can provide the medication for patient to use while at the facility, however Randy Liu is unsure if this is possible and states she has reached out to her administrator to clear this.  At this time, no response from administrator and may not have one until Monday.  Spouse, Randy Liu was updated regarding this barrier.     Expected Discharge Plan: Home/Self Care Barriers to Discharge: Continued Medical Work up  Expected Discharge Plan and Services Expected Discharge Plan: Home/Self Care   Discharge Planning Services: CM Consult   Living arrangements for the past 2 months: Single Family Home Expected Discharge Date: 12/22/20                                     Social Determinants of Health (SDOH) Interventions    Readmission Risk Interventions No flowsheet data found.

## 2020-12-22 NOTE — TOC Progression Note (Signed)
Transition of Care [facility]) - Progression Note    Patient Details  Name: [name] MRN: [id number] Date of Birth: 11/11/1950  Transition of Care [facility]) CM/SW Contact  Armanda Heritage, RN Phone Number: 12/22/2020, 1:32 PM  Clinical Narrative:    CM spoke with patient's spouse and discussed appeal process.  Spouse confirms she has initiated an appeal, states she does not want [facility] as a facility for patient, states she prefers [facility] and understands that that SNF has not extended a bed offer at this time, believes it is because their admissions personal is on vacation and has not had time to review clinicals sent by TOC.  Spouse is also open to [facility], CSW has faxed clinicals to facility, but no bed offers at this time.  At this time, [facility] remains patient's only bed offer.  MD is aware appeals process initiated.       CM provided spouse with IM letter, detailed notice of discharge, and [id number].  CM is awaiting fax from [facility] with document request.    Expected Discharge Plan: Home/Self Care Barriers to Discharge: Continued Medical Work up  Expected Discharge Plan and Services Expected Discharge Plan: Home/Self Care   Discharge Planning Services: CM Consult   Living arrangements for the past 2 months: Single Family Home Expected Discharge Date: 12/22/20                                     Social Determinants of Health (SDOH) Interventions    Readmission Risk Interventions No flowsheet data found.

## 2020-12-22 NOTE — TOC Progression Note (Addendum)
Transition of Care [facility]) - Progression Note    Patient Details  Name: Randy Liu MRN: [id number] Date of Birth: [birth year]  Transition of Care [facility]) CM/SW Contact  Joseph Art, [place] Phone Number: 12/22/2020, 10:29 AM  Clinical Narrative:     I spoke with the patient's Villegas,[name] (Spouse) [phone].  CSw explained the patient the disposition plan and the patient had a bed offer at [facility] and he may be ready for discharge today. Ms. Daisey feels the patient is not ready for discharge and expressed concerns about his breathing.  Ms. Schroeter would like the patient to have a x-ray to determine why his breathing is still "wheezing".  Ms. Bard stated the [facility] CSW did not contact to her until Friday and "there's no way all of the facilities had the time to reply on the bed request."  Ms. Epstein stated she is contacting Medicare and does not want the patient "going to just any SNF", she is also willing to appeal discharge.  CSW went over SNF placement process including possible timeline, and search process.  CSW stated preferred SNF placement could not be guaranteed and once the patient was ready for d/c she would need to make a decision from the bed offers available.  Ms. Gingerich stated she wanted to go to [facility] and visit before she made a decision.  Ms. Jost stated her preferred SNF is [facility] in [place] and requested CSW contact [employer] and find out if they would make a bed offer.  Csw stated I would contact [facility] for a bed offer update, but I was unable to guarantee placement in any SNF.  Ms. Pemble verbalized understanding. Ms. Baird Kay stated she would like for Attending to contact her today.  CSW updated Attending.     CSW left a voicemail for [employer] for [facility] [phone], requesting information on possible bed offer for this patient.  CSW left  [phone] contact number.    Expected Discharge Plan: Home/Self Care Barriers to Discharge: Continued Medical Work up  Expected Discharge Plan and Services Expected Discharge Plan: Home/Self Care   Discharge Planning Services: CM Consult   Living arrangements for the past 2 months: Single Family Home                                       Social Determinants of Health (SDOH) Interventions    Readmission Risk Interventions No flowsheet data found.

## 2020-12-22 NOTE — TOC Progression Note (Signed)
Transition of Care [facility]) - Progression Note    Patient Details  Name: Tavon Magnussen MRN: [id number] Date of Birth: [birth year]  Transition of Care [facility]) CM/SW Contact  Armanda Heritage, RN Phone Number: 12/22/2020, 4:12 PM  Clinical Narrative:    CM received records request from [facility], all requested records uploaded to [facility] ( confirmation # [id number]) kepro case ID [id number].     Expected Discharge Plan: Home/Self Care Barriers to Discharge: Continued Medical Work up  Expected Discharge Plan and Services Expected Discharge Plan: Home/Self Care   Discharge Planning Services: CM Consult   Living arrangements for the past 2 months: Single Family Home Expected Discharge Date: 12/22/20                                     Social Determinants of Health (SDOH) Interventions    Readmission Risk Interventions No flowsheet data found.

## 2020-12-22 NOTE — TOC Progression Note (Signed)
Transition of Care [facility]) - Progression Note    Patient Details  Name: Randy Liu MRN: [id number] Date of Birth: 02-20-1951  Transition of Care [facility]) CM/SW Contact  Armanda Heritage, RN Phone Number: 12/22/2020, 4:10 PM  Clinical Narrative:    Case ID [id number] [facility] Appeal Detailed Notice of Discharge letter created and saved: Yes Detailed Notice of Discharge Document Given to P[employer]: Yes [employer] ROI Document Created: Yes [employer] appeal documents uploaded to [employer] stite: Yes   Expected Discharge Plan: Home/Self Care Barriers to Discharge: Continued Medical Work up  Expected Discharge Plan and Services Expected Discharge Plan: Home/Self Care   Discharge Planning Services: CM Consult   Living arrangements for the past 2 months: Single Family Home Expected Discharge Date: 12/22/20                                     Social Determinants of Health (SDOH) Interventions    Readmission Risk Interventions No flowsheet data found.

## 2020-12-22 NOTE — Care Management Important Message (Signed)
Important Message  Patient Details  Name: Randy Liu MRN: [id number] Date of Birth: [birth year]   Medicare Important Message Given:  Yes     Armanda Heritage, RN 12/22/2020, 1:31 PM

## 2020-12-22 NOTE — Progress Notes (Addendum)
[facility] PROGRESS NOTE   Randy Liu UJW:[id number] DOB: 07-13-1951 DOA: 12/12/2020  PCP: Burman Freestone, MD  Brief History/Interval Summary: 70 y.o.WM PMHx Asthma, insulin-dependent type 2 diabetes, DM neuropathy, HTN, HLD,CKD stage III followed by nephrology, OSA on CPAP, history of PE on Eliquis, GERD, IBS.  Presented with abdominal pain nausea and vomiting.  Found to have abnormal LFTs.  He was also noted to be septic.  Concern was for cholangitis.  Patient was hospitalized.  Seen by gastroenterology.  Underwent ERCP, and ultimately cholecystectomy. Tolerated procedures well. Currently very deconditioned from baseline and unsafe for discharge home.  SNF bed available today but patient family requesting to wait to hear back from another facility which is their first choice; otherwise medically stable for discharge with safe disposition plan.  Subjective/Interval History: No acute issues or events overnight, patient much more awake alert this morning, having bowel movement and bedpan during interview, abdominal pain resolving, otherwise denies nausea vomiting diarrhea constipation headache fevers or chills.  Continues to express weakness with PT evaluation..  Assessment/Plan:  Acute cholangitis/severe sepsis secondary to Klebsiella, POA, resolved -Patient met criteria for severe sepsis on admission given abnormal LFTs -CT abdomen followed by MRCP and ultrasound of the abdomen -GI and general surgery following, appreciate insight and recommendations -ERCP 12/14/20 with notable choledocholithiasis and sphincterotomy with balloon extraction -Cholecystectomy 12/17/2020 -Completed ceftriaxone and metronidazole - shortened course per ID given source control after GB removal. -LFTs downtrending appropriately; no longer following given stability  Cholelithiasis -Status post cholecystectomy 12/17/2020; tolerated well; pain well controlled. BM function returning.  Klebsiella  bacteremia -Most likely secondary to above -Completed antibiotics 12/18/20  Hyperkalemia, resolved -Follow repeat labs  History of PE - Patient on Eliquis prior to admission - resumed postoperatively - ok with surgery. - Doppler studies negative for DVT.  No clinical suspicion for recurrent PE.  Old records were reviewed per Care Everywhere.  Looks like his PE occurred in September of 2020 in the setting of hospitalization.  He remains on it due to poor mobility and increased risk  Acute respiratory failure with hypoxia/obstructive sleep apnea, resolving Does not usually use oxygen at home- weaned off Continue nightly CPAP with home machine  History of asthma Stable  Chronic diastolic CHF Echocardiogram showed diastolic dysfunction. Seems to be stable.  Hyponatremia/hypokalemia/hypophosphatemia Resolved  Diabetes mellitus type 2 uncontrolled with hyperglycemia HbA1c 7.9.  Continue SSI/hypoglycemic protocol Resume home 70/30 at decreased dose -30 units twice daily  Essential hypertension Well controlled on amlodipine hydralazine.  Acute kidney injury on chronic kidney disease stage IIIa, resolved Creatinine peaked at 1.81.  Improved and seems to be back to baseline.  Monitor urine output.  UTI with yeast, resolved Finished abx as above  QT prolongation Avoid QT prolonging medications.  Physical deconditioning/acute on chronic ambulatory dysfuction Patient mentions that he does not ambulate much at home due to history of peripheral neuropathy.  He also had severe physical deconditioning when he had COVID-19. PT continues to work with him - may benefit from SNF vs HHPT  Obesity Estimated body mass index is 37.78 kg/m as calculated from the following:   Height as of this encounter: 6' 1"  (1.854 m).   Weight as of this encounter: 129.9 kg.    DVT Prophylaxis: SCDs Code Status: Full code Family Communication: Discussed with the patient and his wife Disposition Plan:   Status is: Inpatient  Remains inpatient appropriate because:Ongoing active pain requiring inpatient pain management, IV treatments appropriate due to intensity of illness  or inability to take PO and Inpatient level of care appropriate due to severity of illness   Dispo: The patient is from: Home              Anticipated d/c is to: SNF              Patient currently is medically stable to d/c.  SNF bed available as of 12/22/20 - family wants to appeal discharge to wait to hear from a different facility.   Difficult to place patient No  Consultants: Gastroenterology.  General surgery  Procedures:   4/1 echocardiogram;Left Ventricle: LVEF= 60 to 65%.  -Grade II diastolic dysfunction (pseudonormalization).  4/2 ERCP;Choledocholithiasis s/p biliary sphincterotomy  with balloon extraction. - Ascending cholangitis - Periampullary diverticula. - Cholelithiasis with patent cystic duct. - Biopsy was performed  4/5 Lap Chole, liver biopsy, umbilical hernia repair   Medications:  Scheduled: . amLODipine  10 mg Oral Daily  . apixaban  5 mg Oral BID  . carvedilol  12.5 mg Oral BID WC  . chlorhexidine  15 mL Mouth Rinse BID  . docusate sodium  100 mg Oral BID  . hydrALAZINE  25 mg Oral Q6H  . insulin aspart  0-20 Units Subcutaneous TID WC  . insulin aspart  0-5 Units Subcutaneous QHS  . insulin aspart protamine- aspart  30 Units Subcutaneous BID WC  . latanoprost  1 drop Left Eye QHS  . mouth rinse  15 mL Mouth Rinse q12n4p  . metoCLOPramide  5 mg Oral QHS  . montelukast  10 mg Oral QHS  . pantoprazole  40 mg Oral Daily  . polyethylene glycol  17 g Oral BID   Continuous: . methocarbamol (ROBAXIN) IV     VEH:[id number] **OR** acetaminophen, albuterol, benzonatate, methocarbamol (ROBAXIN) IV, metoprolol tartrate, oxyCODONE, prochlorperazine,  traMADol   Objective:  Vital Signs  Vitals:   12/21/20 2052 12/21/20 2100 12/22/20 0500 12/22/20 0542  BP:    (!) 142/72  Pulse:    75  Resp: (!) 27 20  20   Temp:    98.2 F (36.8 C)  TempSrc:    Oral  SpO2:    96%  Weight:   129.9 kg   Height:        Intake/Output Summary (Last 24 hours) at 12/22/2020 0800 Last data filed at 12/22/2020 0600 Gross per 24 hour  Intake 340 ml  Output 1375 ml  Net -1035 ml   Filed Weights   12/20/20 0635 12/21/20 0500 12/22/20 0500  Weight: 132.2 kg 131.9 kg 129.9 kg    General appearance: Awake alert. In no distress Resp: Clear to auscultation bilaterally.  Normal effort Cardio: S1-S2 is normal regular.  No S3-S4.  No rubs murmurs or bruit GI: Abdomen is soft.  Nontender nondistended.  Bowel sounds are present normal.  No masses organomegaly Extremities: Mild edema bilateral lower extremities Neurologic: Alert and oriented x3.  No focal neurological deficits.  Extremely hard of hearing bilaterally    Lab Results:  Data Reviewed: I have personally reviewed following labs and imaging studies  CBC: Recent Labs  Lab 12/16/20 0349 12/17/20 0454 12/18/20 0355 12/19/20 0358 12/20/20 0430  WBC 7.3 7.5 7.9 8.0 6.7  NEUTROABS 4.8 4.5 5.5  --   --   HGB 12.0* 12.2* 12.5* 11.7* 11.3*  HCT 37.6* 38.5* 41.0 37.1* 37.2*  MCV 98.9 99.2 101.5* 100.8* 102.5*  PLT 165 191 205 225 294    Basic Metabolic Panel: Recent Labs  Lab 12/16/20 0349 12/17/20 0454  12/18/20 0355 12/18/20 1337 12/19/20 0358 12/20/20 0430  NA 132* 135 134* 134* 133*  --   K 3.3* 3.8 5.8* 4.2 3.7  --   CL 102 103 99 99 100  --   CO2 21* 20* 20* 21* 21*  --   GLUCOSE 149* 183* 266* 311* 270*  --   BUN 15 11 21  27* 31*  --   CREATININE 0.77 0.84 1.01 1.12 1.04  --   CALCIUM 8.8* 9.2 9.5 9.3 9.2  --   MG 1.7 2.0 1.9  --  1.8 1.7  PHOS 1.2* 2.0* 3.3  --  2.7 3.1    GFR: Estimated Creatinine Clearance: 92.1 mL/min (by C-G formula based on SCr of 1.04  mg/dL).  Liver Function Tests: Recent Labs  Lab 12/16/20 0349 12/17/20 0454 12/18/20 0355  AST 130* 78* 42*  ALT 121* 95* 72*  ALKPHOS 107 124 121  BILITOT 4.8* 3.4* 2.6*  PROT 5.6* 5.9* 6.2*  ALBUMIN 2.3* 2.4* 2.5*    No results for input(s): LIPASE, AMYLASE in the last 168 hours.  Coagulation Profile: No results for input(s): INR, PROTIME in the last 168 hours.   CBG: Recent Labs  Lab 12/21/20 0747 12/21/20 1142 12/21/20 1614 12/21/20 2006 12/22/20 0754  GLUCAP 304* 329* 285* 217* 243*    Anemia Panel: No results for input(s): VITAMINB12, FOLATE, FERRITIN, TIBC, IRON, RETICCTPCT in the last 72 hours.  Recent Results (from the past 240 hour(s))  Culture, blood (Routine x 2)     Status: Abnormal   Collection Time: 12/12/20  7:45 PM   Specimen: BLOOD  Result Value Ref Range Status   Specimen Description   Final    BLOOD RIGHT ANTECUBITAL Performed at [facility], [address], [place] [address]    Special Requests   Final    BOTTLES DRAWN AEROBIC AND ANAEROBIC Blood Culture adequate volume Performed at [facility], [address] [place], [place] [address]    Culture  Setup Time   Final    GRAM NEGATIVE RODS IN BOTH AEROBIC AND ANAEROBIC BOTTLES CRITICAL RESULT CALLED TO, READ BACK BY AND VERIFIED WITHGuadlupe Spanish @ 8242 12/13/20 EB Performed at [facility], [place] [address], [place], [place] [address]    Culture KLEBSIELLA PNEUMONIAE (A)  Final   Report Status 12/15/2020 FINAL  Final   Organism ID, Bacteria KLEBSIELLA PNEUMONIAE  Final      Susceptibility   Klebsiella pneumoniae - MIC*    AMPICILLIN >=32 RESISTANT Resistant     CEFAZOLIN <=4 SENSITIVE Sensitive     CEFEPIME <=0.12 SENSITIVE Sensitive     CEFTAZIDIME <=1 SENSITIVE Sensitive     CEFTRIAXONE <=0.25 SENSITIVE Sensitive     CIPROFLOXACIN <=0.25 SENSITIVE Sensitive     GENTAMICIN <=1 SENSITIVE Sensitive     IMIPENEM <=0.25 SENSITIVE Sensitive      TRIMETH/SULFA <=20 SENSITIVE Sensitive     AMPICILLIN/SULBACTAM 8 SENSITIVE Sensitive     PIP/TAZO <=4 SENSITIVE Sensitive     * KLEBSIELLA PNEUMONIAE  Blood Culture ID Panel (Reflexed)     Status: Abnormal   Collection Time: 12/12/20  7:45 PM  Result Value Ref Range Status   Enterococcus faecalis NOT DETECTED NOT DETECTED Final   Enterococcus Faecium NOT DETECTED NOT DETECTED Final   Listeria monocytogenes NOT DETECTED NOT DETECTED Final   Staphylococcus species NOT DETECTED NOT DETECTED Final   Staphylococcus aureus (BCID) NOT DETECTED NOT DETECTED Final  Staphylococcus epidermidis NOT DETECTED NOT DETECTED Final   Staphylococcus lugdunensis NOT DETECTED NOT DETECTED Final   Streptococcus species NOT DETECTED NOT DETECTED Final   Streptococcus agalactiae NOT DETECTED NOT DETECTED Final   Streptococcus pneumoniae NOT DETECTED NOT DETECTED Final   Streptococcus pyogenes NOT DETECTED NOT DETECTED Final   A.calcoaceticus-baumannii NOT DETECTED NOT DETECTED Final   Bacteroides fragilis NOT DETECTED NOT DETECTED Final   Enterobacterales DETECTED (A) NOT DETECTED Final    Comment: Enterobacterales represent a large order of gram negative bacteria, not a single organism. CRITICAL RESULT CALLED TO, READ BACK BY AND VERIFIED WITH: N GLOGOVAC @ 3220 12/13/20 EB    Enterobacter cloacae complex NOT DETECTED NOT DETECTED Final   Escherichia coli NOT DETECTED NOT DETECTED Final   Klebsiella aerogenes NOT DETECTED NOT DETECTED Final   Klebsiella oxytoca NOT DETECTED NOT DETECTED Final   Klebsiella pneumoniae DETECTED (A) NOT DETECTED Final    Comment: CRITICAL RESULT CALLED TO, READ BACK BY AND VERIFIED WITH: N GLOGOVAC @ 1153 12/13/20 EB    Proteus species NOT DETECTED NOT DETECTED Final   Salmonella species NOT DETECTED NOT DETECTED Final   Serratia marcescens NOT DETECTED NOT DETECTED Final   Haemophilus influenzae NOT DETECTED NOT DETECTED Final   Neisseria meningitidis NOT DETECTED NOT  DETECTED Final   Pseudomonas aeruginosa NOT DETECTED NOT DETECTED Final   Stenotrophomonas maltophilia NOT DETECTED NOT DETECTED Final   Candida albicans NOT DETECTED NOT DETECTED Final   Candida auris NOT DETECTED NOT DETECTED Final   Candida glabrata NOT DETECTED NOT DETECTED Final   Candida krusei NOT DETECTED NOT DETECTED Final   Candida parapsilosis NOT DETECTED NOT DETECTED Final   Candida tropicalis NOT DETECTED NOT DETECTED Final   Cryptococcus neoformans/gattii NOT DETECTED NOT DETECTED Final   CTX-M ESBL NOT DETECTED NOT DETECTED Final   Carbapenem resistance IMP NOT DETECTED NOT DETECTED Final   Carbapenem resistance KPC NOT DETECTED NOT DETECTED Final   Carbapenem resistance NDM NOT DETECTED NOT DETECTED Final   Carbapenem resist OXA 48 LIKE NOT DETECTED NOT DETECTED Final   Carbapenem resistance VIM NOT DETECTED NOT DETECTED Final    Comment: Performed at [facility], [address] [place], [place] [address]  Resp Panel by RT-PCR (Flu A&B, Covid) Nasopharyngeal Swab     Status: None   Collection Time: 12/12/20  8:15 PM   Specimen: Nasopharyngeal Swab; Nasopharyngeal(NP) swabs in vial transport medium  Result Value Ref Range Status   SARS Coronavirus 2 by RT PCR NEGATIVE NEGATIVE Final    Comment: (NOTE) SARS-CoV-2 target nucleic acids are NOT DETECTED.  The SARS-CoV-2 RNA is generally detectable in upper respiratory specimens during the acute phase of infection. The lowest concentration of SARS-CoV-2 viral copies this assay can detect is 138 copies/mL. A negative result does not preclude SARS-Cov-2 infection and should not be used as the sole basis for treatment or other patient management decisions. A negative result may occur with  improper specimen collection/handling, submission of specimen other than nasopharyngeal swab, presence of viral mutation(s) within the areas targeted by this assay, and inadequate number of viral copies(<138 copies/mL). A negative  result must be combined with clinical observations, patient history, and epidemiological information. The expected result is Negative.  Fact Sheet for Patients:  EntrepreneurPulse.com.au  Fact Sheet for Healthcare Providers:  IncredibleEmployment.be  This test is no t yet approved or cleared by the [place] FDA and  has been authorized for detection and/or diagnosis of SARS-CoV-2 by FDA  under an Emergency Use Authorization (EUA). This EUA will remain  in effect (meaning this test can be used) for the duration of the COVID-19 declaration under Section 564(b)(1) of the Act, 21 U.S.C.section 360bbb-3(b)(1), unless the authorization is terminated  or revoked sooner.       Influenza A by PCR NEGATIVE NEGATIVE Final   Influenza B by PCR NEGATIVE NEGATIVE Final    Comment: (NOTE) The Xpert Xpress SARS-CoV-2/FLU/RSV plus assay is intended as an aid in the diagnosis of influenza from Nasopharyngeal swab specimens and should not be used as a sole basis for treatment. Nasal washings and aspirates are unacceptable for Xpert Xpress SARS-CoV-2/FLU/RSV testing.  Fact Sheet for Patients: EntrepreneurPulse.com.au  Fact Sheet for Healthcare Providers: IncredibleEmployment.be  This test is not yet approved or cleared by the [place] FDA and has been authorized for detection and/or diagnosis of SARS-CoV-2 by FDA under an Emergency Use Authorization (EUA). This EUA will remain in effect (meaning this test can be used) for the duration of the COVID-19 declaration under Section 564(b)(1) of the Act, 21 U.S.C. section 360bbb-3(b)(1), unless the authorization is terminated or revoked.  Performed at [facility], [address] [place], [place] [address]   Culture, blood (Routine x 2)     Status: Abnormal   Collection Time: 12/12/20  9:20 PM   Specimen: BLOOD  Result Value Ref Range Status   Specimen  Description   Final    BLOOD Blood Culture adequate volume Performed at [facility], [address] [place], [place] [address]    Special Requests   Final    BOTTLES DRAWN AEROBIC AND ANAEROBIC LEFT ANTECUBITAL Performed at [facility], [address] [place], [place] [address]    Culture  Setup Time   Final    GRAM NEGATIVE RODS IN BOTH AEROBIC AND ANAEROBIC BOTTLES CRITICAL VALUE NOTED.  VALUE IS CONSISTENT WITH PREVIOUSLY REPORTED AND CALLED VALUE.    Culture (A)  Final    KLEBSIELLA PNEUMONIAE SUSCEPTIBILITIES PERFORMED ON PREVIOUS CULTURE WITHIN THE LAST 5 DAYS. Performed at [facility], [place] [address] [place], [place] [address]    Report Status 12/15/2020 FINAL  Final  MRSA PCR Screening     Status: None   Collection Time: 12/13/20  1:29 AM   Specimen: Nasopharyngeal  Result Value Ref Range Status   MRSA by PCR NEGATIVE NEGATIVE Final    Comment:        The GeneXpert MRSA Assay (FDA approved for NASAL specimens only), is one component of a comprehensive MRSA colonization surveillance program. It is not intended to diagnose MRSA infection nor to guide or monitor treatment for MRSA infections. Performed at [facility], [place] [address] [place], [place] [address]   Urine Culture     Status: Abnormal   Collection Time: 12/14/20 12:39 AM   Specimen: Urine, Random  Result Value Ref Range Status   Specimen Description   Final    URINE, RANDOM Performed at [place] [address] [place], [place] [address]    Special Requests   Final    NONE Performed at [facility], [place] [address], [place], [place] [address]    Culture 40,000 COLONIES/mL YEAST (A)  Final   Report Status 12/15/2020 FINAL  Final      Radiology Studies: No results found.  Time spent: 30 minutes   LOS: 9 days   Little Ishikawa  [employer] operational officer on [email or web address]  12/22/2020, 8:00  AM

## 2020-12-22 NOTE — TOC Progression Note (Signed)
Transition of Care [facility]) - Progression Note    Patient Details  Name: Randy Liu MRN: [id number] Date of Birth: 10-29-1950  [facility]) CM/SW Contact  Marina Goodell Phone Number:  [phone] 12/22/2020, 11:46 AM  Clinical Narrative:     CSW spoke with patient's spouse [name],[name] (Spouse) [phone], she requested SF request be sent to [facility].  CSW sent SNF request to [facility].   Expected Discharge Plan: Home/Self Care Barriers to Discharge: Continued Medical Work up  Expected Discharge Plan and Services Expected Discharge Plan: Home/Self Care   Discharge Planning Services: CM Consult   Living arrangements for the past 2 months: Single Family Home Expected Discharge Date: 12/22/20                                     Social Determinants of Health (SDOH) Interventions    Readmission Risk Interventions No flowsheet data found.

## 2020-12-23 ENCOUNTER — Inpatient Hospital Stay (HOSPITAL_COMMUNITY): Payer: Medicare Other

## 2020-12-23 DIAGNOSIS — K8309 Other cholangitis: Secondary | ICD-10-CM | POA: Diagnosis not present

## 2020-12-23 LAB — GLUCOSE, CAPILLARY
Glucose-Capillary: 182 mg/dL — ABNORMAL HIGH (ref 70–99)
Glucose-Capillary: 277 mg/dL — ABNORMAL HIGH (ref 70–99)

## 2020-12-23 MED ORDER — AMLODIPINE BESYLATE 10 MG PO TABS: 10.0000 mg | ORAL_TABLET | Freq: Every day | ORAL | 0 refills | Status: DC

## 2020-12-23 MED ORDER — TRAMADOL HCL 50 MG PO TABS: 50.0000 mg | ORAL_TABLET | Freq: Four times a day (QID) | ORAL | 0 refills | Status: AC | PRN

## 2020-12-23 MED ORDER — CARVEDILOL 12.5 MG PO TABS: 12.5000 mg | ORAL_TABLET | Freq: Two times a day (BID) | ORAL | 0 refills | Status: DC

## 2020-12-23 MED ORDER — NOVOLOG MIX 70/30 FLEXPEN (70-30) 100 UNIT/ML ~~LOC~~ SUPN
40.0000 [IU] | PEN_INJECTOR | Freq: Two times a day (BID) | SUBCUTANEOUS | 11 refills | Status: DC
Start: 2020-12-23 — End: 2021-05-09

## 2020-12-23 MED ORDER — HYDRALAZINE HCL 25 MG PO TABS: 25.0000 mg | ORAL_TABLET | Freq: Four times a day (QID) | ORAL | 0 refills | Status: DC

## 2020-12-23 NOTE — Progress Notes (Signed)
RN gave report to Debo, Charity fundraiser at [facility]. All questions addressed. Pt is dressed an awaiting transportation. VSS. IV removed. Wife is taking pt's belongings (glasses and hearing aids) to facility.

## 2020-12-23 NOTE — Progress Notes (Signed)
Chaplain engaged in a follow-up visit with Randy Liu and his wife.  She noted how Randy Liu chosen not to wear his hearing aids probably because of how bad he Liu felt.  Wife is currently stepping in to make sure he can hear what is happening around him.  Chaplain and wife had conversation about Randy Liu going to rehab and her being a caregiver.  She noted that she was able to do something for herself recently which felt great but that she Liu mainly been with Randy Liu.  Chaplain affirmed her need to take care of herself and the ways in which she Liu been advocating for Randy Liu.  Chaplain offered support, listening, and presence.      12/23/20 1200  Clinical Encounter Type  Visited With Patient and family together  Visit Type Follow-up

## 2020-12-23 NOTE — Progress Notes (Signed)
RN attempted to call report to [facility] x 2 with no response. Will continue to attempt calling to give report to facility.

## 2020-12-23 NOTE — Progress Notes (Signed)
[facility] Progress Note  6 Days Post-Op  Subjective: CC-  Wife at bedside. Mild right-sided abd soreness. Tolerating PO. Having loose, non-bloody BMs. Denies nausea or vomiting.   Objective: Vital signs in last 24 hours: Temp:  [97.5 F (36.4 C)-98.5 F (36.9 C)] 97.6 F (36.4 C) (04/11 0559) Pulse Rate:  [70-77] 70 (04/11 0559) Resp:  [18-20] 20 (04/11 0559) BP: (122-135)/(59-70) 135/67 (04/11 0559) SpO2:  [94 %-95 %] 94 % (04/11 0559) Weight:  [127.1 kg] 127.1 kg (04/11 0611) Last BM Date: 12/22/20  Intake/Output from previous day: 04/10 0701 - 04/11 0700 In: 0  Out: 700 [Urine:700] Intake/Output this shift: No intake/output data recorded.  PE: Gen: Alert, NAD, pleasant Pulm: rate and effort normal CVE:LFYBO, soft,nontender, +BS,lapincisions C/D/I without erythema or drainage Skin: no rashes noted, warm and dry  Lab Results:  No results for input(s): WBC, HGB, HCT, PLT in the last 72 hours. BMET No results for input(s): NA, K, CL, CO2, GLUCOSE, BUN, CREATININE, CALCIUM in the last 72 hours. PT/INR No results for input(s): LABPROT, INR in the last 72 hours. CMP     Component Value Date/Time   NA 133 (L) 12/19/2020 0358   K 3.7 12/19/2020 0358   CL 100 12/19/2020 0358   CO2 21 (L) 12/19/2020 0358   GLUCOSE 270 (H) 12/19/2020 0358   BUN 31 (H) 12/19/2020 0358   CREATININE 1.04 12/19/2020 0358   CALCIUM 9.2 12/19/2020 0358   PROT 6.2 (L) 12/18/2020 0355   ALBUMIN 2.5 (L) 12/18/2020 0355   AST 42 (H) 12/18/2020 0355   ALT 72 (H) 12/18/2020 0355   ALKPHOS 121 12/18/2020 0355   BILITOT 2.6 (H) 12/18/2020 0355   GFRNONAA >60 12/19/2020 0358   Lipase     Component Value Date/Time   LIPASE 25 12/12/2020 1940       Studies/Results: No results found.  Anti-infectives: Anti-infectives (From admission, onward)   Start     Dose/Rate Route Frequency Ordered Stop   12/16/20 1100  fluconazole (DIFLUCAN) tablet 100 mg  Status:  Discontinued         100 mg Oral Daily 12/16/20 1005 12/18/20 1409   12/16/20 1000  fluconazole (DIFLUCAN) IVPB 100 mg  Status:  Discontinued        100 mg 50 mL/hr over 60 Minutes Intravenous Every 24 hours 12/15/20 1427 12/16/20 1005   12/15/20 1530  fluconazole (DIFLUCAN) IVPB 200 mg        200 mg 100 mL/hr over 60 Minutes Intravenous  Once 12/15/20 1426 12/15/20 1701   12/15/20 0900  cefTRIAXone (ROCEPHIN) 2 g in sodium chloride 0.9 % 100 mL IVPB  Status:  Discontinued        2 g 200 mL/hr over 30 Minutes Intravenous Every 24 hours 12/15/20 0814 12/18/20 1409   12/13/20 2200  vancomycin (VANCOREADY) IVPB 1500 mg/300 mL  Status:  Discontinued        1,500 mg 150 mL/hr over 120 Minutes Intravenous Every 24 hours 12/12/20 2054 12/13/20 1003   12/13/20 2200  vancomycin (VANCOREADY) IVPB 1250 mg/250 mL  Status:  Discontinued        1,250 mg 166.7 mL/hr over 90 Minutes Intravenous Every 24 hours 12/13/20 1003 12/13/20 1256   12/13/20 0800  metroNIDAZOLE (FLAGYL) IVPB 500 mg        500 mg 100 mL/hr over 60 Minutes Intravenous Every 8 hours 12/13/20 0241 12/18/20 1900   12/12/20 2245  metroNIDAZOLE (FLAGYL) IVPB 500 mg  500 mg 100 mL/hr over 60 Minutes Intravenous  Once 12/12/20 2235 12/13/20 0108   12/12/20 2200  ceFEPIme (MAXIPIME) 2 g in sodium chloride 0.9 % 100 mL IVPB  Status:  Discontinued        2 g 200 mL/hr over 30 Minutes Intravenous Every 12 hours 12/12/20 2024 12/12/20 2033   12/12/20 2130  vancomycin (VANCOCIN) IVPB 1000 mg/200 mL premix       "Followed by" Linked Group Details   1,000 mg 200 mL/hr over 60 Minutes Intravenous  Once 12/12/20 2024 12/12/20 2348   12/12/20 2045  ceFEPIme (MAXIPIME) 2 g in sodium chloride 0.9 % 100 mL IVPB  Status:  Discontinued        2 g 200 mL/hr over 30 Minutes Intravenous Every 12 hours 12/12/20 2033 12/15/20 0814   12/12/20 2030  vancomycin (VANCOCIN) IVPB 1000 mg/200 mL premix       "Followed by" Linked Group Details   1,000 mg 200 mL/hr over  60 Minutes Intravenous  Once 12/12/20 2024 12/12/20 2225       Assessment/Plan Cholangitis -s/p successful ERCP 4/2 withsphincterotomy and removal of 3 stones. GI recommendsliver biopsy at the time of cholecystectomy to rule out underlying cirrhosis - LFTs are trending down (12/18/20)  S/p laparoscopic cholecystectomy, liver biopsy, primary repair umbilical hernia 4/5 Dr. Gerrit Friends - POD#6 - Liver biopsy w/ grade 1 steatohepatitis, portal and centrilobular fibrosis  - Doing well from surgical standpoint. Tolerating diet. Having bowel function. Ok for discharge once disposition arranged. Discharge instructions and follow up info on AVS. [facility] will sign off. Please call us back as needed.  HTN DM OSA - CPAP Asthma - uses inhaler PRN, not on home O2.  Acute respiratory failure with hypoxia Acute diastolic CHF - EF 60-65% on ECHO 12/13/20 CKD stage III - baseline Cr 1~.48 Wheelchair bound x1 year post-covid-19 infection Obesity BMI 38.42 Hx PE on eliquis (last dose 3/31) UTI - yeast on Ucx, completed diflucan Bacteremia - klebsiella pneumoniae, completed abx  ID -diflucan 4/3>>4/6, rocephin 4/3>>4/6, flagyl 3/31>>4/6, vancomycin 3/31>>4/1, cefepime 3/31 x1 FEN -CM diet VTE -SCDs,eliquis restarted 4/6 and hgb stable Foley -none Follow up -[facility]   LOS: 10 days    Adam Phenix, [facility] 12/23/2020, 10:00 AM Please see Amion for pager number during day hours 7:00am-4:30pm

## 2020-12-23 NOTE — Progress Notes (Signed)
RN gave report to [facility] transport. Pt moved to stretcher. Wife at bedside and will meet pt at [facility] facility. VSS. No further needs at this time.

## 2020-12-23 NOTE — Discharge Summary (Signed)
Physician Discharge Summary  Randy Liu EXN:[id number] DOB: Jan 30, 1951 DOA: 12/12/2020  PCP: Burman Freestone, MD  Admit date: 12/12/2020 Discharge date: 12/23/2020  Admitted From: Home Disposition: SNF  Recommendations for Outpatient Follow-up:  1. Follow up with PCP in 1-2 weeks 2. Please obtain BMP/CBC in one week 3. Please follow up with general surgery as scheduled  Discharge Condition: Stable CODE STATUS: Full Diet recommendation: Low-carb low-fat diet  Brief/Interim Summary: 70 y.o.WM PMHx Asthma, insulin-dependent type 2 diabetes, DM neuropathy, HTN, HLD,CKD stage III followed by nephrology, OSA on CPAP, history of PE onEliquis,GERD, IBS.  Presented with abdominal pain nausea and vomiting.  Found to have abnormal LFTs.  He was also noted to be septic.  Concern was for cholangitis.  Patient was hospitalized.  Seen by gastroenterology.  Underwent ERCP, and ultimately cholecystectomy. Tolerated procedures well. Currently very deconditioned from baseline and unsafe for discharge home  Patient admitted as above with acute cholangitis with severe sepsis secondary to Klebsiella bacteremia infection.  Patient symptoms resolved quickly with supportive care, intervention with ERCP on 12/14/2020 as well as cholecystectomy on 12/17/2020.  Patient has completed antibiotic course given source control with cholecystectomy.  Labs downtrending appropriately, patient evaluated by PT and given his acute ambulatory dysfunction on chronic ambulatory dysfunction he was deemed inappropriate for discharge home due to ongoing weakness and high fall risk.  Patient otherwise stable and agreeable for SNF placement.  Please see below for complete medication changes and updates.  Discharge Diagnoses:  Principal Problem:   Acute cholangitis Active Problems:   Acute hypoxemic respiratory failure (HCC)   Hyponatremia   Asthma   Diabetes (HCC)   Yeast infection   Elevated LFTs  Acute cholangitis/severe  sepsis secondary to Klebsiella, POA, resolved -Patient met criteria for severe sepsis on admission given abnormal LFTs -CT abdomen followed by MRCP and ultrasound of the abdomen -GI and general surgery following, appreciate insight and recommendations -ERCP 12/14/20 with notable choledocholithiasis and sphincterotomy with balloon extraction -Cholecystectomy 12/17/2020 -Completed ceftriaxone and metronidazole - shortened course per ID given source control after GB removal. -LFTs downtrending appropriately; no longer following given stability  Cholelithiasis -Status post cholecystectomy 12/17/2020; tolerated well; pain well controlled. BM function returning.  Klebsiella bacteremia -Most likely secondary to above -Completed antibiotics 12/18/20  Hyperkalemia, resolved -Follow repeat labs  History of PE - Patient on Eliquis prior to admission - resumed postoperatively - ok with surgery. - Doppler studies negative for DVT.  No clinical suspicion for recurrent PE.  Old records were reviewed per Care Everywhere.  Looks like his PE occurred in September of 2020 in the setting of hospitalization.  He remains on it due to poor mobility and increased risk  Acute respiratory failure with hypoxia/obstructive sleep apnea, resolving Does not usually use oxygen at home- weaned off Continue nightly CPAP with home machine  History of asthma Stable  Chronic diastolic CHF Echocardiogram showed diastolic dysfunction. Seems to be stable.  Hyponatremia/hypokalemia/hypophosphatemia Resolved  Diabetes mellitus type 2 uncontrolled with hyperglycemia HbA1c 7.9.  Continue SSI/hypoglycemic protocol Resume home 70/30 at decreased dose -30 units twice daily  Essential hypertension Well controlled on amlodipine hydralazine.  Acute kidney injury on chronic kidney disease stage IIIa, resolved Creatinine peaked at 1.81.  Improved and seems to be back to baseline.  Monitor urine output.  UTI with  yeast, resolved Finished abx as above  QT prolongation Avoid QT prolonging medications.  Physical deconditioning/acute on chronic ambulatory dysfuction Patient mentions that he does not ambulate much at home due  to history of peripheral neuropathy.  He also had severe physical deconditioning when he had COVID-19. PT continues to work with him - may benefit from SNF vs HHPT  Obesity Estimated body mass index is 37.78 kg/m as calculated from the following:   Height as of this encounter: 6' 1" (1.854 m).   Weight as of this encounter: 129.9 kg.    Discharge Instructions  Discharge Instructions    Call MD for:  severe uncontrolled pain   Complete by: As directed    Call MD for:  temperature >100.4   Complete by: As directed    Diet general   Complete by: As directed    Low-carb low-fat diet   Discharge wound care:   Complete by: As directed    Per general surgery   Increase activity slowly   Complete by: As directed      Allergies as of 12/23/2020      Reactions   Metoclopramide Itching   Loss of Balance; Urinary Incontinence   Nsaids    Other reaction(s): Other (See Comments) Renal Insufficiency Kidney issues   Amoxicillin-pot Clavulanate Diarrhea, Nausea And Vomiting   Other reaction(s): Other (See Comments) Abdomen Pain Abdomen pain   Statins    Other reaction(s): Other (See Comments) Myalgias and Myositis myalgias   Allopurinol Rash   Empagliflozin    Other reaction(s): Other (See Comments) Fainting, weakness, lightheaded, falling   Tiotropium    Other reaction(s): Other (See Comments) Urinary retention      Medication List    STOP taking these medications   Invokana 100 MG Tabs tablet Generic drug: canagliflozin     TAKE these medications   Align 4 MG Caps Take 1 capsule by mouth daily.   amLODipine 10 MG tablet Commonly known as: NORVASC Take 1 tablet (10 mg total) by mouth daily.   benzonatate 200 MG capsule Commonly known as:  TESSALON Take 200 mg by mouth 3 (three) times daily as needed for cough.   carvedilol 12.5 MG tablet Commonly known as: COREG Take 1 tablet (12.5 mg total) by mouth 2 (two) times daily with a meal. What changed:   medication strength  when to take this   Cholecalciferol 25 MCG (1000 UT) capsule Take 2,000 Units by mouth in the morning and at bedtime.   colchicine-probenecid 0.5-500 MG tablet Take 1 tablet by mouth daily.   cyanocobalamin 1000 MCG/ML injection Commonly known as: (VITAMIN B-12) Inject 1,000 mcg into the skin every 30 (thirty) days. 27th of month   Eliquis 5 MG Tabs tablet Generic drug: apixaban Take 5 mg by mouth 2 (two) times daily.   fenofibrate 160 MG tablet Take 160 mg by mouth daily.   hydrALAZINE 25 MG tablet Commonly known as: APRESOLINE Take 1 tablet (25 mg total) by mouth every 6 (six) hours.   IBgard 90 MG Cpcr Generic drug: Peppermint Oil Take 1 capsule by mouth daily.   lansoprazole 30 MG capsule Commonly known as: PREVACID in the morning and at bedtime.   latanoprost 0.005 % ophthalmic solution Commonly known as: XALATAN Place 1 drop into the left eye at bedtime.   magnesium oxide 400 MG tablet Commonly known as: MAG-OX Take 1 tablet by mouth daily.   meclizine 25 MG tablet Commonly known as: ANTIVERT Take 25 mg by mouth 3 (three) times daily as needed for nausea.   metoCLOPramide 5 MG tablet Commonly known as: REGLAN Take 2.5-5 mg by mouth at bedtime.   montelukast 10 MG tablet Commonly  known as: SINGULAIR Take 10 mg by mouth at bedtime.   NovoLOG Mix 70/30 FlexPen (70-30) 100 UNIT/ML FlexPen Generic drug: insulin aspart protamine - aspart Inject 0.4 mLs (40 Units total) into the skin 2 (two) times daily with a meal. 81 units in the AM and 68 units in the evening What changed:   how much to take  when to take this   ondansetron 8 MG tablet Commonly known as: ZOFRAN Take 8 mg by mouth every 8 (eight) hours as needed  for nausea or vomiting.   traMADol 50 MG tablet Commonly known as: ULTRAM Take 1 tablet (50 mg total) by mouth every 6 (six) hours as needed for up to 5 days for moderate pain.            Discharge Care Instructions  (From admission, onward)         Start     Ordered   12/23/20 0000  Discharge wound care:       Comments: Per general surgery   12/23/20 1146          Follow-up [facility] Go on 01/09/2021.   Specialty: General Surgery Why: Your appointment is 4/28 at 1:30pm Please arrive 30 minutes prior to your appointment to check in and fill out paperwork. Bring photo ID and insurance information. Contact information: [place] [phone]             Allergies  Allergen Reactions  . Metoclopramide Itching    Loss of Balance; Urinary Incontinence  . Nsaids     Other reaction(s): Other (See Comments) Renal Insufficiency Kidney issues   . Amoxicillin-Pot Clavulanate Diarrhea and Nausea And Vomiting    Other reaction(s): Other (See Comments) Abdomen Pain Abdomen pain   . Statins     Other reaction(s): Other (See Comments) Myalgias and Myositis myalgias   . Allopurinol Rash  . Empagliflozin     Other reaction(s): Other (See Comments) Fainting, weakness, lightheaded, falling  . Tiotropium     Other reaction(s): Other (See Comments) Urinary retention    Consultations:  General surgery, GI   Procedures/Studies: DG Chest 2 View  Result Date: 12/12/2020 CLINICAL DATA:  Abdominal pain for several days EXAM: CHEST - 2 VIEW COMPARISON:  02/29/2020 FINDINGS: Cardiac shadow is enlarged but stable. Aortic calcifications are noted. 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 Lungs are clear. No bony abnormality is noted. 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 IMPRESSION: No  acute abnormality seen. Electronically Signed   By: Inez Catalina M.D.   On: 12/12/2020 20:58   CT ABDOMEN PELVIS W CONTRAST  Result Date: 12/12/2020 CLINICAL DATA:  Generalized abdominal pain for several days EXAM: CT ABDOMEN AND PELVIS WITH CONTRAST TECHNIQUE: Multidetector CT imaging of the abdomen and pelvis was performed using the standard protocol following bolus administration of intravenous contrast. CONTRAST:  182m OMNIPAQUE IOHEXOL 300 MG/ML  SOLN COMPARISON:  06/10/2019 FINDINGS: Lower chest: No acute abnormality. Hepatobiliary: Fatty infiltration of the liver is noted. The gallbladder is within normal limits. No biliary ductal dilatation is seen. Pancreas: Unremarkable. No pancreatic ductal dilatation or surrounding inflammatory changes. Spleen: Normal in size without focal abnormality. Adrenals/Urinary Tract: Adrenal glands are within normal limits. Kidneys demonstrate a normal enhancement pattern. Normal excretion of contrast is noted bilaterally. Large exophytic cyst is noted arising from the lower  pole of the left kidney measuring 8.1 cm stable in appearance from the prior exam. No renal calculi or obstructive changes are seen. Ureters are within normal limits. The bladder is well distended. Stomach/Bowel: The appendix is within normal limits. No obstructive or inflammatory changes are noted within the colon. The small bowel and stomach are within normal limits. Vascular/Lymphatic: Aortic atherosclerosis. No enlarged abdominal or pelvic lymph nodes. Reproductive: Prostate is unremarkable. Other: Small fat containing umbilical hernia is again seen. No abdominopelvic ascites. Musculoskeletal: Degenerative changes of lumbar spine are noted. IMPRESSION: Fatty liver Stable renal cysts. No acute abnormality noted. Electronically Signed   By: Inez Catalina M.D.   On: 12/12/2020 20:54   MR 3D Recon At Scanner  Result Date: 12/13/2020 CLINICAL DATA:  Elevated LFTs.  Gallbladder sludge.  Jaundice. EXAM:  MRI ABDOMEN WITHOUT AND WITH CONTRAST (INCLUDING MRCP) TECHNIQUE: Multiplanar multisequence MR imaging of the abdomen was performed both before and after the administration of intravenous contrast. Heavily T2-weighted images of the biliary and pancreatic ducts were obtained, and three-dimensional MRCP images were rendered by post processing. CONTRAST:  75m GADAVIST GADOBUTROL 1 MMOL/ML IV SOLN COMPARISON:  Prior CT from December 12, 2020 and ultrasound of December 13, 2020. FINDINGS: Lower chest: Incidental imaging of the lung bases, limited on MRI without effusion or consolidation. Hepatobiliary: Marked hepatic steatosis. No focal, suspicious hepatic lesion. No pericholecystic fluid. Sludge in the dependent gallbladder. No sign of biliary duct dilation. Mildly heterogeneous parenchymal enhancement pattern without focal, suspicious hepatic lesion. Most striking on the arterial phase. The portal vein is patent. Hepatic veins are patent. Common bile duct with intermediate signal seen in the lumen of the distal common bile duct. Mild dilation, between 6 and 7 mm of the distal common bile duct but without dilation of the proximal common bile duct or intrahepatic biliary tree. Mildly limited assessment on MRCP due to a relative lack of signal and some motion related artifact. Mildly increased T1 signal in the dependent duct as well. Pancreas: Pancreatic atrophy without ductal dilation or sign of inflammation. 12 x 12 mm lesion in the head of the atrophic pancreas with cystic features. About the body of the pancreas a 7 mm lesion also with cystic features. Spleen:  Normal Adrenals/Urinary Tract: Normal adrenal glands. The large cyst from the lower pole the LEFT kidney extending laterally. Small cyst on the RIGHT. No hydronephrosis. Stomach/Bowel: No sign of acute gastrointestinal process to the extent evaluated on abdominal MRI not performed for bowel evaluation. Vascular/Lymphatic: Abdominal vasculature is patent without signs  of dilation. Mild celiac nodal prominence/top-normal size 11 mm node behind the head of the pancreas on image 66 of series 32 and another small celiac node along the course of the common hepatic artery on image 60 of series 32. Other:  No ascites Musculoskeletal: No suspicious bone lesions identified. IMPRESSION: 1. Sludge in the dependent gallbladder and sludge in the distal common bile duct. No intrahepatic biliary ductal dilation and mild dilation of the distal common bile duct as described, significance uncertain given lack of intrahepatic biliary duct distension and signs of profound hepatic steatosis. Heterogeneous enhancement pattern in the liver without focal lesion raising the question of acute hepatitis. Correlate with laboratory values to determine significance of above findings. 2. Two cystic lesions in the pancreas, largest of which is in the head of the pancreas measuring up to 12 mm. These could represent small side branch IPMN's. Suggest follow-up MRI/MRCP in 2 years to assess for stability. 3. Mild celiac  nodal prominence/to-normal size node along the course of the common hepatic artery. This could be seen in the setting of hepatic dysfunction/liver disease. Electronically Signed   By: Zetta Bills M.D.   On: 12/13/2020 15:24   DG ERCP BILIARY & PANCREATIC DUCTS  Result Date: 12/14/2020 CLINICAL DATA:  ERCP with sphincterotomy. EXAM: ERCP TECHNIQUE: Multiple spot images obtained with the fluoroscopic device and submitted for interpretation post-procedure. FLUOROSCOPY TIME:  2 minutes, 44 seconds (86.3 mGy) COMPARISON:  MRCP-12/13/2020 FINDINGS: Eight spot intraoperative fluoroscopic images the right upper abdominal quadrant during ERCP are provided for review Initial image demonstrates an ERCP probe overlying the right upper abdominal quadrant Subsequent images demonstrate selective cannulation and opacification of the common bile duct which appears nondilated. Subsequent images demonstrate  insufflation of a balloon within the central aspect of the CBD with subsequent sweeping and presumed sphincterotomy. There is opacification of the cystic duct with passage of contrast to the level of the gallbladder lumen. There is minimal opacification intrahepatic biliary tree which appears nondilated. There is no definitive opacification of the pancreatic duct. IMPRESSION: ERCP with biliary sweeping and presumed sphincterotomy as above. These images were submitted for radiologic interpretation only. Please see the procedural report for the amount of contrast and the fluoroscopy time utilized. Electronically Signed   By: Sandi Mariscal M.D.   On: 12/14/2020 10:18   MR ABDOMEN MRCP W WO CONTAST  Result Date: 12/13/2020 CLINICAL DATA:  Elevated LFTs.  Gallbladder sludge.  Jaundice. EXAM: MRI ABDOMEN WITHOUT AND WITH CONTRAST (INCLUDING MRCP) TECHNIQUE: Multiplanar multisequence MR imaging of the abdomen was performed both before and after the administration of intravenous contrast. Heavily T2-weighted images of the biliary and pancreatic ducts were obtained, and three-dimensional MRCP images were rendered by post processing. CONTRAST:  70m GADAVIST GADOBUTROL 1 MMOL/ML IV SOLN COMPARISON:  Prior CT from December 12, 2020 and ultrasound of December 13, 2020. FINDINGS: Lower chest: Incidental imaging of the lung bases, limited on MRI without effusion or consolidation. Hepatobiliary: Marked hepatic steatosis. No focal, suspicious hepatic lesion. No pericholecystic fluid. Sludge in the dependent gallbladder. No sign of biliary duct dilation. Mildly heterogeneous parenchymal enhancement pattern without focal, suspicious hepatic lesion. Most striking on the arterial phase. The portal vein is patent. Hepatic veins are patent. Common bile duct with intermediate signal seen in the lumen of the distal common bile duct. Mild dilation, between 6 and 7 mm of the distal common bile duct but without dilation of the proximal common bile  duct or intrahepatic biliary tree. Mildly limited assessment on MRCP due to a relative lack of signal and some motion related artifact. Mildly increased T1 signal in the dependent duct as well. Pancreas: Pancreatic atrophy without ductal dilation or sign of inflammation. 12 x 12 mm lesion in the head of the atrophic pancreas with cystic features. About the body of the pancreas a 7 mm lesion also with cystic features. Spleen:  Normal Adrenals/Urinary Tract: Normal adrenal glands. The large cyst from the lower pole the LEFT kidney extending laterally. Small cyst on the RIGHT. No hydronephrosis. Stomach/Bowel: No sign of acute gastrointestinal process to the extent evaluated on abdominal MRI not performed for bowel evaluation. Vascular/Lymphatic: Abdominal vasculature is patent without signs of dilation. Mild celiac nodal prominence/top-normal size 11 mm node behind the head of the pancreas on image 66 of series 32 and another small celiac node along the course of the common hepatic artery on image 60 of series 32. Other:  No ascites Musculoskeletal: No suspicious bone  lesions identified. IMPRESSION: 1. Sludge in the dependent gallbladder and sludge in the distal common bile duct. No intrahepatic biliary ductal dilation and mild dilation of the distal common bile duct as described, significance uncertain given lack of intrahepatic biliary duct distension and signs of profound hepatic steatosis. Heterogeneous enhancement pattern in the liver without focal lesion raising the question of acute hepatitis. Correlate with laboratory values to determine significance of above findings. 2. Two cystic lesions in the pancreas, largest of which is in the head of the pancreas measuring up to 12 mm. These could represent small side branch IPMN's. Suggest follow-up MRI/MRCP in 2 years to assess for stability. 3. Mild celiac nodal prominence/to-normal size node along the course of the common hepatic artery. This could be seen in the  setting of hepatic dysfunction/liver disease. Electronically Signed   By: Zetta Bills M.D.   On: 12/13/2020 15:24   ECHOCARDIOGRAM COMPLETE  Result Date: 12/13/2020    ECHOCARDIOGRAM REPORT   Patient Name:   Randy Liu Date of Exam: 12/13/2020 Medical Rec #:  [id number]          Height:       73.0 in Accession #:    [id number]         Weight:       277.3 lb Date of Birth:  [birth year]          BSA:          2.472 m Patient Age:    35 years           BP:           99/48 mmHg Patient Gender: M                  HR:           68 bpm. Exam Location:  Inpatient Procedure: 2D Echo, Cardiac Doppler, Color Doppler and Intracardiac            Opacification Agent Indications:    CHF-Acute Systolic D78.24  History:        Patient has no prior history of Echocardiogram examinations.                 Risk Factors:Hypertension and Diabetes.  Sonographer:    Bernadene Person RDCS Referring Phys: [id number] [place]  1. Left ventricular ejection fraction, by estimation, is 60 to 65%. The left ventricle has normal function. The left ventricle has no regional wall motion abnormalities. There is mild left ventricular hypertrophy. Left ventricular diastolic parameters are consistent with Grade II diastolic dysfunction (pseudonormalization).  2. Right ventricular systolic function is normal. The right ventricular size is normal. There is normal pulmonary artery systolic pressure.  3. The mitral valve is normal in structure. Trivial mitral valve regurgitation. No evidence of mitral stenosis.  4. The aortic valve is normal in structure. Aortic valve regurgitation is not visualized. No aortic stenosis is present.  5. The inferior vena cava is normal in size with greater than 50% respiratory variability, suggesting right atrial pressure of 3 mmHg. FINDINGS  Left Ventricle: Left ventricular ejection fraction, by estimation, is 60 to 65%. The left ventricle has normal function. The left ventricle has no regional wall  motion abnormalities. Definity contrast agent was given IV to delineate the left ventricular  endocardial borders. The left ventricular internal cavity size was normal in size. There is mild left ventricular hypertrophy. Left ventricular diastolic parameters are consistent with Grade II diastolic dysfunction (pseudonormalization). Right Ventricle: The  right ventricular size is normal. No increase in right ventricular wall thickness. Right ventricular systolic function is normal. There is normal pulmonary artery systolic pressure. The tricuspid regurgitant velocity is 2.02 m/s, and  with an assumed right atrial pressure of 3 mmHg, the estimated right ventricular systolic pressure is 27.0 mmHg. Left Atrium: Left atrial size was normal in size. Right Atrium: Right atrial size was normal in size. Pericardium: There is no evidence of pericardial effusion. Mitral Valve: The mitral valve is normal in structure. Trivial mitral valve regurgitation. No evidence of mitral valve stenosis. Tricuspid Valve: The tricuspid valve is normal in structure. Tricuspid valve regurgitation is trivial. No evidence of tricuspid stenosis. Aortic Valve: The aortic valve is normal in structure. Aortic valve regurgitation is not visualized. No aortic stenosis is present. Pulmonic Valve: The pulmonic valve was normal in structure. Pulmonic valve regurgitation is not visualized. No evidence of pulmonic stenosis. Aorta: The aortic root is normal in size and structure. Venous: The inferior vena cava is normal in size with greater than 50% respiratory variability, suggesting right atrial pressure of 3 mmHg. IAS/Shunts: No atrial level shunt detected by color flow Doppler.  LEFT VENTRICLE PLAX 2D LVIDd:         4.10 cm  Diastology LVIDs:         2.80 cm  LV e' medial:    3.89 cm/s LV PW:         1.20 cm  LV E/e' medial:  30.6 LV IVS:        1.10 cm  LV e' lateral:   4.78 cm/s LVOT diam:     1.90 cm  LV E/e' lateral: 24.9 LV SV:         62 LV SV Index:    25 LVOT Area:     2.84 cm  RIGHT VENTRICLE RV S prime:     10.60 cm/s TAPSE (M-mode): 1.5 cm LEFT ATRIUM             Index       RIGHT ATRIUM           Index LA diam:        3.10 cm 1.25 cm/m  RA Area:     11.00 cm LA Vol (A2C):   35.1 ml 14.20 ml/m RA Volume:   23.40 ml  9.46 ml/m LA Vol (A4C):   35.5 ml 14.36 ml/m LA Biplane Vol: 35.1 ml 14.20 ml/m  AORTIC VALVE LVOT Vmax:   91.60 cm/s LVOT Vmean:  70.200 cm/s LVOT VTI:    0.219 m  AORTA Ao Root diam: 3.50 cm Ao Asc diam:  3.30 cm MITRAL VALVE                TRICUSPID VALVE MV Area (PHT): 3.91 cm     TR Peak grad:   16.3 mmHg MV Decel Time: 194 msec     TR Vmax:        202.00 cm/s MV E velocity: 119.00 cm/s MV A velocity: 91.70 cm/s   SHUNTS MV E/A ratio:  1.30         Systemic VTI:  0.22 m                             Systemic Diam: 1.90 cm Candee Furbish MD Electronically signed by Candee Furbish MD Signature Date/Time: 12/13/2020/1:21:12 PM    Final    VAS [place] LOWER EXTREMITY VENOUS (DVT)  Result Date: 12/13/2020  Lower Venous DVT Study Indications: History of PE, new SOB.  Anticoagulation: Long-term eliquis, being held for testing. Limitations: Body habitus and poor ultrasound/tissue interface. Comparison Study: No prior studies. Performing Technologist: Darlin Coco RDMS,RVT  Examination Guidelines: A complete evaluation includes B-mode imaging, spectral Doppler, color Doppler, and power Doppler as needed of all accessible portions of each vessel. Bilateral testing is considered an integral part of a complete examination. Limited examinations for reoccurring indications may be performed as noted. The reflux portion of the exam is performed with the patient in reverse Trendelenburg.  +---------+---------------+---------+-----------+----------+-------------------+ RIGHT    CompressibilityPhasicitySpontaneityPropertiesThrombus Aging      +---------+---------------+---------+-----------+----------+-------------------+ CFV      Full           Yes       Yes                                      +---------+---------------+---------+-----------+----------+-------------------+ SFJ      Full                                                             +---------+---------------+---------+-----------+----------+-------------------+ FV Prox  Full                                                             +---------+---------------+---------+-----------+----------+-------------------+ FV Mid   Full                                                             +---------+---------------+---------+-----------+----------+-------------------+ FV DistalFull                                                             +---------+---------------+---------+-----------+----------+-------------------+ PFV      Full                                                             +---------+---------------+---------+-----------+----------+-------------------+ POP      Full           Yes      Yes                                      +---------+---------------+---------+-----------+----------+-------------------+ PTV                     Yes      Yes                                      +---------+---------------+---------+-----------+----------+-------------------+  PERO                                                  Not well visualized +---------+---------------+---------+-----------+----------+-------------------+   +---------+---------------+---------+-----------+----------+-------------------+ LEFT     CompressibilityPhasicitySpontaneityPropertiesThrombus Aging      +---------+---------------+---------+-----------+----------+-------------------+ CFV      Full           Yes      Yes                                      +---------+---------------+---------+-----------+----------+-------------------+ SFJ      Full                                                              +---------+---------------+---------+-----------+----------+-------------------+ FV Prox  Full                                                             +---------+---------------+---------+-----------+----------+-------------------+ FV Mid   Full                                                             +---------+---------------+---------+-----------+----------+-------------------+ FV DistalFull                                                             +---------+---------------+---------+-----------+----------+-------------------+ PFV      Full                                                             +---------+---------------+---------+-----------+----------+-------------------+ POP      Full           Yes      Yes                                      +---------+---------------+---------+-----------+----------+-------------------+ PTV      Full                                         Some segments not  well visualized     +---------+---------------+---------+-----------+----------+-------------------+ PERO     Full                                                             +---------+---------------+---------+-----------+----------+-------------------+     Summary: RIGHT: - There is no evidence of deep vein thrombosis in the lower extremity. However, portions of this examination were limited- see technologist comments above.  - No cystic structure found in the popliteal fossa.  LEFT: - There is no evidence of deep vein thrombosis in the lower extremity. However, portions of this examination were limited- see technologist comments above.  - No cystic structure found in the popliteal fossa.  *See table(s) above for measurements and observations. Electronically signed by Monica Martinez MD on 12/13/2020 at 4:55:00 PM.    Final    US Abdomen Limited RUQ (LIVER/GB)  Result Date:  12/13/2020 CLINICAL DATA:  Elevated LFTs EXAM: ULTRASOUND ABDOMEN LIMITED RIGHT UPPER QUADRANT COMPARISON:  CT from the previous day. FINDINGS: Gallbladder: Gallbladder is well distended with sludge within. No cholelithiasis is noted. No wall thickening or pericholecystic fluid is noted. Negative sonographic Murphy's sign is elicited. Common bile duct: Diameter: 6.5 mm. This is within normal limits for the patient's age. Liver: Mild increased echogenicity is noted consistent with fatty infiltration. No focal mass is noted. Portal vein is patent on color Doppler imaging with normal direction of blood flow towards the liver. Other: None. IMPRESSION: Gallbladder sludge without complicating factors. Fatty infiltration of the liver. Electronically Signed   By: Inez Catalina M.D.   On: 12/13/2020 03:32     Subjective: No acute issues or events overnight denies nausea vomiting diarrhea constipation headache fevers or chills.   Discharge Exam: Vitals:   12/23/20 0559 12/23/20 1104  BP: 135/67   Pulse: 70   Resp: 20   Temp: 97.6 F (36.4 C)   SpO2: 94% 95%   Vitals:   12/22/20 2340 12/23/20 0559 12/23/20 0611 12/23/20 1104  BP: 126/60 135/67    Pulse: 71 70    Resp: 18 20    Temp: 97.7 F (36.5 C) 97.6 F (36.4 C)    TempSrc: Oral Oral    SpO2:  94%  95%  Weight:   127.1 kg   Height:        General: Pt is alert, awake, not in acute distress Cardiovascular: RRR, S1/S2 +, no rubs, no gallops Respiratory: CTA bilaterally, no wheezing, no rhonchi Abdominal: Soft, NT, ND, bowel sounds +, postoperative bandages clean dry intact Extremities: no edema, no cyanosis    The results of significant diagnostics from this hospitalization (including imaging, microbiology, ancillary and laboratory) are listed below for reference.     Microbiology: Recent Results (from the past 240 hour(s))  Urine Culture     Status: Abnormal   Collection Time: 12/14/20 12:39 AM   Specimen: Urine, Random  Result  Value Ref Range Status   Specimen Description   Final    URINE, RANDOM Performed at [facility] [address], [place], [place] [address]    Special Requests   Final    NONE Performed at [facility], [place] [address], [place], [place] [address]    Culture 40,000 COLONIES/mL YEAST (A)  Final   Report Status 12/15/2020 FINAL  Final  Resp Panel by RT-PCR (Flu A&B, Covid) Nasopharyngeal Swab     Status: None   Collection Time: 12/22/20  4:04 PM   Specimen: Nasopharyngeal Swab; Nasopharyngeal(NP) swabs in vial transport medium  Result Value Ref Range Status   SARS Coronavirus 2 by RT PCR NEGATIVE NEGATIVE Final    Comment: (NOTE) SARS-CoV-2 target nucleic acids are NOT DETECTED.  The SARS-CoV-2 RNA is generally detectable in upper respiratory specimens during the acute phase of infection. The lowest concentration of SARS-CoV-2 viral copies this assay can detect is 138 copies/mL. A negative result does not preclude SARS-Cov-2 infection and should not be used as the sole basis for treatment or other patient management decisions. A negative result may occur with  improper specimen collection/handling, submission of specimen other than nasopharyngeal swab, presence of viral mutation(s) within the areas targeted by this assay, and inadequate number of viral copies(<138 copies/mL). A negative result must be combined with clinical observations, patient history, and epidemiological information. The expected result is Negative.  Fact Sheet for Patients:  EntrepreneurPulse.com.au  Fact Sheet for Healthcare Providers:  IncredibleEmployment.be  This test is no t yet approved or cleared by the [place] FDA and  has been authorized for detection and/or diagnosis of SARS-CoV-2 by FDA under an Emergency Use Authorization (EUA). This EUA will remain  in effect (meaning this test can be used) for the duration of  the COVID-19 declaration under Section 564(b)(1) of the Act, 21 U.S.C.section 360bbb-3(b)(1), unless the authorization is terminated  or revoked sooner.       Influenza A by PCR NEGATIVE NEGATIVE Final   Influenza B by PCR NEGATIVE NEGATIVE Final    Comment: (NOTE) The Xpert Xpress SARS-CoV-2/FLU/RSV plus assay is intended as an aid in the diagnosis of influenza from Nasopharyngeal swab specimens and should not be used as a sole basis for treatment. Nasal washings and aspirates are unacceptable for Xpert Xpress SARS-CoV-2/FLU/RSV testing.  Fact Sheet for Patients: EntrepreneurPulse.com.au  Fact Sheet for Healthcare Providers: IncredibleEmployment.be  This test is not yet approved or cleared by the [place] FDA and has been authorized for detection and/or diagnosis of SARS-CoV-2 by FDA under an Emergency Use Authorization (EUA). This EUA will remain in effect (meaning this test can be used) for the duration of the COVID-19 declaration under Section 564(b)(1) of the Act, 21 U.S.C. section 360bbb-3(b)(1), unless the authorization is terminated or revoked.  Performed at [facility], [place] [address] [place], [place] [address]      Labs: BNP (last 3 results) No results for input(s): BNP in the last 8760 hours. Basic Metabolic Panel: Recent Labs  Lab 12/17/20 0454 12/18/20 0355 12/18/20 1337 12/19/20 0358 12/20/20 0430  NA 135 134* 134* 133*  --   K 3.8 5.8* 4.2 3.7  --   CL 103 99 99 100  --   CO2 20* 20* 21* 21*  --   GLUCOSE 183* 266* 311* 270*  --   BUN 11 21 27* 31*  --   CREATININE 0.84 1.01 1.12 1.04  --   CALCIUM 9.2 9.5 9.3 9.2  --   MG 2.0 1.9  --  1.8 1.7  PHOS 2.0* 3.3  --  2.7 3.1   Liver Function Tests: Recent Labs  Lab 12/17/20 0454 12/18/20 0355  AST 78* 42*  ALT 95* 72*  ALKPHOS 124 121  BILITOT 3.4* 2.6*  PROT 5.9* 6.2*  ALBUMIN 2.4* 2.5*   No results for input(s): LIPASE,  AMYLASE in  the last 168 hours. No results for input(s): AMMONIA in the last 168 hours. CBC: Recent Labs  Lab 12/17/20 0454 12/18/20 0355 12/19/20 0358 12/20/20 0430  WBC 7.5 7.9 8.0 6.7  NEUTROABS 4.5 5.5  --   --   HGB 12.2* 12.5* 11.7* 11.3*  HCT 38.5* 41.0 37.1* 37.2*  MCV 99.2 101.5* 100.8* 102.5*  PLT 191 205 225 220   Cardiac Enzymes: No results for input(s): CKTOTAL, CKMB, CKMBINDEX, TROPONINI in the last 168 hours. BNP: Invalid input(s): POCBNP CBG: Recent Labs  Lab 12/22/20 0754 12/22/20 1213 12/22/20 1701 12/22/20 2128 12/23/20 0731  GLUCAP 243* 301* 168* 123* 182*   D-Dimer No results for input(s): DDIMER in the last 72 hours. Hgb A1c No results for input(s): HGBA1C in the last 72 hours. Lipid Profile No results for input(s): CHOL, HDL, LDLCALC, TRIG, CHOLHDL, LDLDIRECT in the last 72 hours. Thyroid function studies No results for input(s): TSH, T4TOTAL, T3FREE, THYROIDAB in the last 72 hours.  Invalid input(s): FREET3 Anemia work up No results for input(s): VITAMINB12, FOLATE, FERRITIN, TIBC, IRON, RETICCTPCT in the last 72 hours. Urinalysis    Component Value Date/Time   COLORURINE AMBER (A) 12/14/2020 0039   APPEARANCEUR CLEAR 12/14/2020 0039   LABSPEC 1.041 (H) 12/14/2020 0039   PHURINE 5.0 12/14/2020 0039   GLUCOSEU >=500 (A) 12/14/2020 0039   HGBUR NEGATIVE 12/14/2020 0039   BILIRUBINUR NEGATIVE 12/14/2020 0039   KETONESUR 5 (A) 12/14/2020 0039   PROTEINUR NEGATIVE 12/14/2020 0039   NITRITE NEGATIVE 12/14/2020 0039   LEUKOCYTESUR NEGATIVE 12/14/2020 0039   Sepsis Labs Invalid input(s): PROCALCITONIN,  WBC,  LACTICIDVEN Microbiology Recent Results (from the past 240 hour(s))  Urine Culture     Status: Abnormal   Collection Time: 12/14/20 12:39 AM   Specimen: Urine, Random  Result Value Ref Range Status   Specimen Description   Final    URINE, RANDOM Performed at [facility], [place] [address] [place], [place]  [address]    Special Requests   Final    NONE Performed at [facility], [place] [address], [place], [place] [address]    Culture 40,000 COLONIES/mL YEAST (A)  Final   Report Status 12/15/2020 FINAL  Final  Resp Panel by RT-PCR (Flu A&B, Covid) Nasopharyngeal Swab     Status: None   Collection Time: 12/22/20  4:04 PM   Specimen: Nasopharyngeal Swab; Nasopharyngeal(NP) swabs in vial transport medium  Result Value Ref Range Status   SARS Coronavirus 2 by RT PCR NEGATIVE NEGATIVE Final    Comment: (NOTE) SARS-CoV-2 target nucleic acids are NOT DETECTED.  The SARS-CoV-2 RNA is generally detectable in upper respiratory specimens during the acute phase of infection. The lowest concentration of SARS-CoV-2 viral copies this assay can detect is 138 copies/mL. A negative result does not preclude SARS-Cov-2 infection and should not be used as the sole basis for treatment or other patient management decisions. A negative result may occur with  improper specimen collection/handling, submission of specimen other than nasopharyngeal swab, presence of viral mutation(s) within the areas targeted by this assay, and inadequate number of viral copies(<138 copies/mL). A negative result must be combined with clinical observations, patient history, and epidemiological information. The expected result is Negative.  Fact Sheet for Patients:  EntrepreneurPulse.com.au  Fact Sheet for Healthcare Providers:  IncredibleEmployment.be  This test is no t yet approved or cleared by the [place] FDA and  has been authorized for detection and/or diagnosis of SARS-CoV-2 by FDA under an  Emergency Use Authorization (EUA). This EUA will remain  in effect (meaning this test can be used) for the duration of the COVID-19 declaration under Section 564(b)(1) of the Act, 21 U.S.C.section 360bbb-3(b)(1), unless the authorization is terminated  or revoked sooner.        Influenza A by PCR NEGATIVE NEGATIVE Final   Influenza B by PCR NEGATIVE NEGATIVE Final    Comment: (NOTE) The Xpert Xpress SARS-CoV-2/FLU/RSV plus assay is intended as an aid in the diagnosis of influenza from Nasopharyngeal swab specimens and should not be used as a sole basis for treatment. Nasal washings and aspirates are unacceptable for Xpert Xpress SARS-CoV-2/FLU/RSV testing.  Fact Sheet for Patients: EntrepreneurPulse.com.au  Fact Sheet for Healthcare Providers: IncredibleEmployment.be  This test is not yet approved or cleared by the [place] FDA and has been authorized for detection and/or diagnosis of SARS-CoV-2 by FDA under an Emergency Use Authorization (EUA). This EUA will remain in effect (meaning this test can be used) for the duration of the COVID-19 declaration under Section 564(b)(1) of the Act, 21 U.S.C. section 360bbb-3(b)(1), unless the authorization is terminated or revoked.  Performed at [facility], [place] [address], [place], [place] [address]      Time coordinating discharge: Over 30 minutes  SIGNED:   Little Ishikawa, DO [facility] 12/23/2020, 11:46 AM Pager   If 7PM-7AM, please contact night-coverage [email or web address]

## 2020-12-23 NOTE — TOC Progression Note (Addendum)
Transition of Care [facility]) - Progression Note    Patient Details  Name: Randy Liu MRN: [id number] Date of Birth: 04-15-1951  [facility] of [facility]) CM/SW Contact  Geni Bers, RN Phone Number: 12/23/2020, 12:13 PM  Clinical Narrative:    Pt discharging to [facility]. Pt's wife at bedside agreed with pt going to [facility]. Pt's wife also states that she will call to cancel Appeal.  [facility] Mons was called for transportation. Pt's wife is at bedside and aware of transportation.    Expected Discharge Plan: Home/Self Care Barriers to Discharge: Continued Medical Work up  Expected Discharge Plan and Services Expected Discharge Plan: Home/Self Care   Discharge Planning Services: CM Consult   Living arrangements for the past 2 months: Single Family Home Expected Discharge Date: 12/23/20                                     Social Determinants of Health (SDOH) Interventions    Readmission Risk Interventions No flowsheet data found.

## 2020-12-26 ENCOUNTER — Encounter (HOSPITAL_COMMUNITY): Payer: Self-pay | Admitting: Emergency Medicine

## 2020-12-26 ENCOUNTER — Other Ambulatory Visit: Payer: Self-pay

## 2020-12-26 ENCOUNTER — Emergency Department (HOSPITAL_BASED_OUTPATIENT_CLINIC_OR_DEPARTMENT_OTHER): Payer: Medicare Other

## 2020-12-26 ENCOUNTER — Emergency Department (HOSPITAL_COMMUNITY): Payer: Medicare Other

## 2020-12-26 ENCOUNTER — Emergency Department (HOSPITAL_COMMUNITY)
Admission: EM | Admit: 2020-12-26 | Discharge: 2020-12-28 | Disposition: A | Discharge status: Home / Self Care | Payer: Medicare Other | Attending: Emergency Medicine | Admitting: Emergency Medicine

## 2020-12-26 DIAGNOSIS — M25561 Pain in right knee: Secondary | ICD-10-CM

## 2020-12-26 DIAGNOSIS — Z87891 Personal history of nicotine dependence: Secondary | ICD-10-CM | POA: Diagnosis not present

## 2020-12-26 DIAGNOSIS — Z7901 Long term (current) use of anticoagulants: Secondary | ICD-10-CM | POA: Insufficient documentation

## 2020-12-26 DIAGNOSIS — M79604 Pain in right leg: Secondary | ICD-10-CM | POA: Insufficient documentation

## 2020-12-26 DIAGNOSIS — R6883 Chills (without fever): Secondary | ICD-10-CM | POA: Diagnosis present

## 2020-12-26 DIAGNOSIS — R531 Weakness: Secondary | ICD-10-CM | POA: Insufficient documentation

## 2020-12-26 DIAGNOSIS — Z79899 Other long term (current) drug therapy: Secondary | ICD-10-CM | POA: Insufficient documentation

## 2020-12-26 DIAGNOSIS — R110 Nausea: Secondary | ICD-10-CM | POA: Insufficient documentation

## 2020-12-26 DIAGNOSIS — I509 Heart failure, unspecified: Secondary | ICD-10-CM | POA: Insufficient documentation

## 2020-12-26 DIAGNOSIS — J45909 Unspecified asthma, uncomplicated: Secondary | ICD-10-CM | POA: Diagnosis not present

## 2020-12-26 DIAGNOSIS — I110 Hypertensive heart disease with heart failure: Secondary | ICD-10-CM | POA: Diagnosis not present

## 2020-12-26 DIAGNOSIS — Z20822 Contact with and (suspected) exposure to covid-19: Secondary | ICD-10-CM | POA: Insufficient documentation

## 2020-12-26 DIAGNOSIS — R059 Cough, unspecified: Secondary | ICD-10-CM | POA: Insufficient documentation

## 2020-12-26 DIAGNOSIS — E119 Type 2 diabetes mellitus without complications: Secondary | ICD-10-CM | POA: Insufficient documentation

## 2020-12-26 HISTORY — DX: Heart failure, unspecified: I50.9

## 2020-12-26 LAB — RESP PANEL BY RT-PCR (FLU A&B, COVID) ARPGX2
Influenza A by PCR: NEGATIVE
Influenza B by PCR: NEGATIVE
SARS Coronavirus 2 by RT PCR: NEGATIVE

## 2020-12-26 LAB — CBC WITH DIFFERENTIAL/PLATELET
Abs Immature Granulocytes: 0.05 10*3/uL (ref 0.00–0.07)
Basophils Absolute: 0 10*3/uL (ref 0.0–0.1)
Basophils Relative: 0 %
Eosinophils Absolute: 0.1 10*3/uL (ref 0.0–0.5)
Eosinophils Relative: 1 %
HCT: 39.4 % (ref 39.0–52.0)
Hemoglobin: 12.4 g/dL — ABNORMAL LOW (ref 13.0–17.0)
Immature Granulocytes: 1 %
Lymphocytes Relative: 18 %
Lymphs Abs: 1.5 10*3/uL (ref 0.7–4.0)
MCH: 31.8 pg (ref 26.0–34.0)
MCHC: 31.5 g/dL (ref 30.0–36.0)
MCV: 101 fL — ABNORMAL HIGH (ref 80.0–100.0)
Monocytes Absolute: 0.8 10*3/uL (ref 0.1–1.0)
Monocytes Relative: 10 %
Neutro Abs: 5.8 10*3/uL (ref 1.7–7.7)
Neutrophils Relative %: 70 %
Platelets: 389 10*3/uL (ref 150–400)
RBC: 3.9 MIL/uL — ABNORMAL LOW (ref 4.22–5.81)
RDW: 15.5 % (ref 11.5–15.5)
WBC: 8.2 10*3/uL (ref 4.0–10.5)
nRBC: 0 % (ref 0.0–0.2)

## 2020-12-26 LAB — COMPREHENSIVE METABOLIC PANEL
ALT: 31 U/L (ref 0–44)
AST: 38 U/L (ref 15–41)
Albumin: 2.8 g/dL — ABNORMAL LOW (ref 3.5–5.0)
Alkaline Phosphatase: 72 U/L (ref 38–126)
Anion gap: 11 (ref 5–15)
BUN: 23 mg/dL (ref 8–23)
CO2: 24 mmol/L (ref 22–32)
Calcium: 9.1 mg/dL (ref 8.9–10.3)
Chloride: 107 mmol/L (ref 98–111)
Creatinine, Ser: 1.24 mg/dL (ref 0.61–1.24)
GFR, Estimated: >60 mL/min (ref >60)
Glucose, Bld: 216 mg/dL — ABNORMAL HIGH (ref 70–99)
Potassium: 4.1 mmol/L (ref 3.5–5.1)
Sodium: 142 mmol/L (ref 135–145)
Total Bilirubin: 1.2 mg/dL (ref 0.3–1.2)
Total Protein: 6.9 g/dL (ref 6.5–8.1)

## 2020-12-26 LAB — URINALYSIS, ROUTINE W REFLEX MICROSCOPIC
Bilirubin Urine: NEGATIVE
Glucose, UA: 50 mg/dL — AB
Hgb urine dipstick: NEGATIVE
Ketones, ur: NEGATIVE mg/dL
Leukocytes,Ua: NEGATIVE
Nitrite: NEGATIVE
Protein, ur: NEGATIVE mg/dL
Specific Gravity, Urine: 1.017 (ref 1.005–1.030)
pH: 5 (ref 5.0–8.0)

## 2020-12-26 MED ORDER — SODIUM CHLORIDE 0.9 % IV BOLUS
500.0000 mL | Freq: Once | INTRAVENOUS | Status: AC
  Administered 2020-12-26: 500 mL via INTRAVENOUS

## 2020-12-26 MED ORDER — SODIUM CHLORIDE 0.9 % IV BOLUS: 1000.0000 mL | Freq: Once | INTRAVENOUS | Status: DC

## 2020-12-26 NOTE — ED Provider Notes (Addendum)
[facility]-EMERGENCY DEPT Provider Note   CSN: [id number] Arrival date & time: 12/26/20  1127     History Chief Complaint  Patient presents with  . Weakness  . Nausea  . Leg Pain  . Chills    Randy Liu is a 70 y.o. male with past medical history significant for IDDM, HTN, HLD, OSA, asthma, IBS, and history of PE on Eliquis who presents to the ED from [facility] with complaints of chills and weakness.  History was obtained by EMS who noted that they were concerned about his atraumatic right leg pain given recent hospitalization.    I reviewed his medical record and he was admitted to the hospital on 12/12/2020 for cholangitis.  Patient was ultimately discharged home just 3 days ago on 12/23/2020.  At time of discharge, his LFTs have been downtrending appropriately and he was s/p cholecystectomy 12/17/2020.  He was bacteremic with Klebsiella, but he completed courses of ceftriaxone and metronidazole on 12/18/2020.  On my examination, patient is accompanied by his wife.  Evidently he had been doing well with rehabilitation, but began to develop atraumatic right posterior lower leg discomfort yesterday.  Wife does note that he has a history of gout, but he states that this feels different.  This morning he felt particularly cold and was having shaking chills.  She states that the last time he had shaking rigors like this he was found to be septic.  She states that when EMS got him last time, his oral temperature was normal but his rectal was very high and he ultimately was found to be very sick.  She is concerned that he is developing sepsis again.  He states that he has a mild cough, nothing significant.  She states that he has not been eating and drinking well and suspects that he is dehydrated.  His urine has been dark, but she thinks that that may be from the dehydration rather than infection.  He states that he gets sick to his stomach  when trying to eat.  His last bowel movement was yesterday, normal.  Soft.  He denies any abdominal pain and wife states that he has not been confused.  He denies any chest pain or difficulty breathing, headache or dizziness, or other symptoms.  He has been bedbound x6 months.  HPI     Past Medical History:  Diagnosis Date  . Asthma   . CHF (congestive heart failure) (HCC)   . Diabetes mellitus without complication (HCC)   . Hypertension     Patient Active Problem List   Diagnosis Date Noted  . Yeast infection 12/15/2020  . Elevated LFTs 12/15/2020  . Acute hypoxemic respiratory failure (HCC) 12/13/2020  . Hyponatremia 12/13/2020  . Asthma 12/13/2020  . Diabetes (HCC) 12/13/2020  . Acute cholangitis 12/12/2020    Past Surgical History:  Procedure Laterality Date  . BIOPSY  12/14/2020   Procedure: BIOPSY;  Surgeon: Lynann Bologna, MD;  Location: [facility];  Service: Endoscopy;;  . CHOLECYSTECTOMY N/A 12/17/2020   Procedure: LAPAROSCOPIC CHOLECYSTECTOMY, LIVER BIOPSY, PRIMARY UMBILICAL HERNIA REPAIR;  Surgeon: Darnell Level, MD;  Location: [facility] ORS;  Service: General;  Laterality: N/A;  . ENDOSCOPIC RETROGRADE CHOLANGIOPANCREATOGRAPHY (ERCP) WITH PROPOFOL N/A 12/14/2020   Procedure: ENDOSCOPIC RETROGRADE CHOLANGIOPANCREATOGRAPHY (ERCP) WITH PROPOFOL;  Surgeon: Lynann Bologna, MD;  Location: [facility];  Service: Endoscopy;  Laterality: N/A;  . REMOVAL OF STONES  12/14/2020   Procedure: REMOVAL OF STONES;  Surgeon: Lynann Bologna, MD;  Location: [facility];  Service: Endoscopy;;  . SPHINCTEROTOMY  12/14/2020   Procedure: SPHINCTEROTOMY;  Surgeon: Lynann Bologna, MD;  Location: [facility];  Service: Endoscopy;;       History reviewed. No pertinent family history.  Social History   Tobacco Use  . Smoking status: Former Games developer  . Smokeless tobacco: Never Used  Substance Use Topics  . Alcohol use: Not Currently  . Drug use: Never    Home Medications Prior to Admission  medications   Medication Sig Start Date End Date Taking? Authorizing Provider  amLODipine (NORVASC) 10 MG tablet Take 1 tablet (10 mg total) by mouth daily. 12/23/20  Yes Azucena Fallen, MD  benzonatate (TESSALON) 200 MG capsule Take 200 mg by mouth every 8 (eight) hours as needed for cough. 11/18/20  Yes [provider]  carvedilol (COREG) 12.5 MG tablet Take 1 tablet (12.5 mg total) by mouth 2 (two) times daily with a meal. 12/23/20  Yes Azucena Fallen, MD  Cholecalciferol 25 MCG (1000 UT) capsule Take 2,000 Units by mouth in the morning and at bedtime.   Yes [provider]  colchicine-probenecid 0.5-500 MG tablet Take 1 tablet by mouth daily. 11/21/20  Yes [provider]  cyanocobalamin (,VITAMIN B-12,) 1000 MCG/ML injection Inject 1,000 mcg into the skin every 30 (thirty) days. 27th of month 09/23/20  Yes [provider]  ELIQUIS 5 MG TABS tablet Take 5 mg by mouth 2 (two) times daily. 10/21/20  Yes [provider]  fenofibrate 160 MG tablet Take 160 mg by mouth daily. 12/04/20  Yes [provider]  hydrALAZINE (APRESOLINE) 25 MG tablet Take 1 tablet (25 mg total) by mouth every 6 (six) hours. 12/23/20  Yes Azucena Fallen, MD  lansoprazole (PREVACID) 30 MG capsule Take 30 mg by mouth in the morning and at bedtime. 12/02/20  Yes [provider]  latanoprost (XALATAN) 0.005 % ophthalmic solution Place 1 drop into the left eye at bedtime. 10/29/20  Yes [provider]  magnesium oxide (MAG-OX) 400 MG tablet Take 400 mg by mouth daily. 10/09/20  Yes [provider]  meclizine (ANTIVERT) 25 MG tablet Take 25 mg by mouth 3 (three) times daily as needed for nausea.   Yes [provider]  metoCLOPramide (REGLAN) 5 MG tablet Take 5 mg by mouth at bedtime. 10/09/20  Yes [provider]  montelukast (SINGULAIR) 10 MG tablet Take 10 mg by mouth at bedtime. 09/28/20  Yes [provider]  NOVOLOG  MIX 70/30 FLEXPEN (70-30) 100 UNIT/ML FlexPen Inject 0.4 mLs (40 Units total) into the skin 2 (two) times daily with a meal. 81 units in the AM and 68 units in the evening Patient taking differently: Inject 40-81 Units into the skin See admin instructions. 81 units in the 0900 40 units at 1200 40 units at 1700 and 68 units at 2100 12/23/20  Yes Azucena Fallen, MD  ondansetron (ZOFRAN) 8 MG tablet Take 8 mg by mouth every 8 (eight) hours as needed for nausea or vomiting.   Yes [provider]  Probiotic Product (ALIGN) 4 MG CAPS Take 4 mg by mouth daily.   Yes [provider]  traMADol (ULTRAM) 50 MG tablet Take 1 tablet (50 mg total) by mouth every 6 (six) hours as needed for up to 5 days for moderate pain. 12/23/20 12/28/20 Yes Azucena Fallen, MD    Allergies    Metoclopramide, Nsaids, Amoxicillin-pot clavulanate, Statins, Allopurinol, Empagliflozin, and Tiotropium  Review of  Systems   Review of Systems  All other systems reviewed and are negative.   Physical Exam Updated Vital Signs BP 117/64   Pulse 68   Temp 98.3 F (36.8 C) (Rectal)   Resp 18   SpO2 98%   Physical Exam Vitals and nursing note reviewed. Exam conducted with a chaperone present.  Constitutional:      General: He is not in acute distress.    Appearance: He is not toxic-appearing.  HENT:     Head: Normocephalic and atraumatic.  Eyes:     General: No scleral icterus.    Conjunctiva/sclera: Conjunctivae normal.  Cardiovascular:     Rate and Rhythm: Normal rate.     Pulses: Normal pulses.  Pulmonary:     Effort: Pulmonary effort is normal. No respiratory distress.  Abdominal:     General: Abdomen is flat. There is no distension.     Palpations: Abdomen is soft. There is no mass.     Tenderness: There is no abdominal tenderness.     Comments: Soft, nondistended.  No wrist tenderness.  3 well-healing incisions noted.  No significant surrounding induration or erythema concerning for  infection.  Musculoskeletal:     Comments: Right leg: Tenderness in right popliteal region.  No overlying skin changes.  No asymmetries between right and left lower extremities.  Peripheral pulses intact.  Sensation intact throughout.   Skin:    General: Skin is dry.  Neurological:     Mental Status: He is alert.     GCS: GCS eye subscore is 4. GCS verbal subscore is 5. GCS motor subscore is 6.  Psychiatric:        Mood and Affect: Mood normal.        Behavior: Behavior normal.        Thought Content: Thought content normal.     ED Results / Procedures / Treatments   Labs (all labs ordered are listed, but only abnormal results are displayed) Labs Reviewed  CBC WITH DIFFERENTIAL/PLATELET - Abnormal; Notable for the following components:      Result Value   RBC 3.90 (*)    Hemoglobin 12.4 (*)    MCV 101.0 (*)    All other components within normal limits  COMPREHENSIVE METABOLIC PANEL - Abnormal; Notable for the following components:   Glucose, Bld 216 (*)    Albumin 2.8 (*)    All other components within normal limits  CULTURE, BLOOD (ROUTINE X 2)  CULTURE, BLOOD (ROUTINE X 2)  URINE CULTURE  RESP PANEL BY RT-PCR (FLU A&B, COVID) ARPGX2  URINALYSIS, ROUTINE W REFLEX MICROSCOPIC    EKG EKG Interpretation  Date/Time:  Thursday December 26 2020 11:46:30 EDT Ventricular Rate:  81 PR Interval:  157 QRS Duration: 102 QT Interval:  401 QTC Calculation: 466 R Axis:   -29 Text Interpretation: Sinus rhythm Borderline left axis deviation Low voltage, extremity and precordial leads Abnormal R-wave progression, late transition similar to December 13 2020 Confirmed by Pricilla Loveless [phone]) on 12/26/2020 1:20:33 PM   Radiology DG Chest Portable 1 View  Result Date: 12/26/2020 CLINICAL DATA:  Cough and chills EXAM: PORTABLE CHEST 1 VIEW COMPARISON:  December 23, 2020 FINDINGS: There is a left pleural effusion with left base atelectasis. Lungs elsewhere clear. Heart is upper normal in size  with pulmonary vascularity normal. No adenopathy. Thoracic tracheal deviation less prominent on this study compared to most recent study. No bone lesions. There is aortic atherosclerosis. IMPRESSION: Left pleural effusion with left base atelectasis.  Lungs elsewhere clear. Stable cardiac silhouette Aortic Atherosclerosis (ICD10-I70.0). Electronically Signed   By: Bretta Bang III M.D.   On: 12/26/2020 12:55   VAS [place] LOWER EXTREMITY VENOUS (DVT) (ONLY [facility] & [facility] 7a-7p)  Result Date: 12/26/2020  Lower Venous DVT Study Indications: New pain behind RT knee.  Risk Factors: Recent extended hospital admission. Comparison       12-13-2020 Lower extremity venous bilateral was negative for Study:           DVT. Performing Technologist: Jean Rosenthal RDMS,RVT  Examination Guidelines: A complete evaluation includes B-mode imaging, spectral Doppler, color Doppler, and power Doppler as needed of all accessible portions of each vessel. Bilateral testing is considered an integral part of a complete examination. Limited examinations for reoccurring indications may be performed as noted. The reflux portion of the exam is performed with the patient in reverse Trendelenburg.  +---------+---------------+---------+-----------+----------+--------------+ RIGHT    CompressibilityPhasicitySpontaneityPropertiesThrombus Aging +---------+---------------+---------+-----------+----------+--------------+ CFV      Full           Yes      Yes                                 +---------+---------------+---------+-----------+----------+--------------+ SFJ      Full                                                        +---------+---------------+---------+-----------+----------+--------------+ FV Prox  Full                                                        +---------+---------------+---------+-----------+----------+--------------+ FV Mid   Full                                                         +---------+---------------+---------+-----------+----------+--------------+ FV DistalFull                                                        +---------+---------------+---------+-----------+----------+--------------+ PFV      Full                                                        +---------+---------------+---------+-----------+----------+--------------+ POP      Full           Yes      Yes                                 +---------+---------------+---------+-----------+----------+--------------+ PTV      Full                                                        +---------+---------------+---------+-----------+----------+--------------+  PERO     Full                                                        +---------+---------------+---------+-----------+----------+--------------+   +---------+---------------+---------+-----------+----------+--------------+ LEFT     CompressibilityPhasicitySpontaneityPropertiesThrombus Aging +---------+---------------+---------+-----------+----------+--------------+ CFV      Full           Yes      Yes                                 +---------+---------------+---------+-----------+----------+--------------+ SFJ      Full                                                        +---------+---------------+---------+-----------+----------+--------------+ FV Prox  Full                                                        +---------+---------------+---------+-----------+----------+--------------+ FV Mid   Full                                                        +---------+---------------+---------+-----------+----------+--------------+ FV DistalFull                                                        +---------+---------------+---------+-----------+----------+--------------+ PFV      Full                                                         +---------+---------------+---------+-----------+----------+--------------+ POP      Full           Yes      Yes                                 +---------+---------------+---------+-----------+----------+--------------+ PTV      Full                                                        +---------+---------------+---------+-----------+----------+--------------+ PERO     Full                                                        +---------+---------------+---------+-----------+----------+--------------+  Summary: RIGHT: - There is no evidence of deep vein thrombosis in the lower extremity.  - No cystic structure found in the popliteal fossa.  LEFT: - There is no evidence of deep vein thrombosis in the lower extremity.  - No cystic structure found in the popliteal fossa.  *See table(s) above for measurements and observations.    Preliminary     Procedures Procedures   Medications Ordered in ED Medications  sodium chloride 0.9 % bolus 500 mL (has no administration in time range)    ED Course  I have reviewed the triage vital signs and the nursing notes.  Pertinent labs & imaging results that were available during my care of the patient were reviewed by me and considered in my medical decision making (see chart for details).    MDM Rules/Calculators/A&P                          Bjorn PippinMichael Kelner was evaluated in Emergency Department on 12/26/2020 for the symptoms described in the history of present illness. He was evaluated in the context of the global COVID-19 pandemic, which necessitated consideration that the patient might be at risk for infection with the SARS-CoV-2 virus that causes COVID-19. Institutional protocols and algorithms that pertain to the evaluation of patients at risk for COVID-19 are in a state of rapid change based on information released by regulatory bodies including the CDC and federal and state organizations. These policies and algorithms were  followed during the patient's care in the ED.  I personally reviewed patient's medical chart and all notes from triage and staff during today's encounter. I have also ordered and reviewed all labs and imaging that I felt to be medically necessary in the evaluation of this patient's complaints and with consideration of their physical exam. If needed, translation services were available and utilized.   Patient in the ED for nonspecific chills and rigors and concern for sepsis.  He admittedly has been bedbound and weak for 6 months secondary to COVID-19 infection and was just discharged from the hospital a few days ago after two-week hospital admission for cholangitis.  Vital signs have been stable and within normal limits.  He was complaining of right popliteal region discomfort and tenderness down his entire right calf.  DVT study obtained here in the ED was negative for thrombosis.  Chest x-ray obtained demonstrates a left pleural effusion, but no focal consolidation concerning for pneumonia.  He has had vascular congestion seen on prior plain films of chest.  CBC with mild anemia and hemoglobin of 12.4, improved when compared to priors.  WBC within normal limits and consistent with most recent labs.  CMP shows continued improvement of transaminitis and his liver enzymes are now within normal limits.  He does have a mild reduction in his albumin and his creatinine is very mildly bumped when compared to priors which is perhaps consistent with his reportedly diminished p.o. intake.  However, no AKI or significant derangement noted.  UA still pending at time of handoff.  If UA is unremarkable, do not feel as though antibiotics or further intervention are warranted.  I discussed case with Dr. Criss AlvineGoldston who agrees.  Would consider COVID-19 prior to discharge back to rehabilitation facility.  [facility] shift change [facility] will follow pending studies, re-evaluate, and determine  disposition.     Final Clinical Impression(s) / ED Diagnoses Final diagnoses:  Chills    Rx /  DC Orders ED Discharge Orders    None       Lorelee New, PA-C 12/26/20 1536    Lorelee New, PA-C 12/26/20 1539    Pricilla Loveless, MD 12/26/20 [phone]

## 2020-12-26 NOTE — ED Notes (Signed)
Attempted to call report to the facility. No one picked up the phone.

## 2020-12-26 NOTE — Discharge Instructions (Signed)
Your evaluation today has been reassuring currently do not see signs of infection, your vitals have been normal here today.  You do have blood cultures pending and if these return positive you will be called and asked to return to the hospital for further evaluation.  If you develop fevers, vomiting, abdominal pain, worsening cough, chest pain, shortness of breath or any other new or worsening symptoms please do not hesitate to return for further evaluation.

## 2020-12-26 NOTE — ED Triage Notes (Signed)
Patient is from [facility] in [place], [place]. He presents today with chills, nausea weakness and right leg pain. The leg pain started last night and is worse today. He was recently discharged post cholecystectomy in which he was bed bound during that admission. With PT he was able to sit up yesterday, but today he could not. HX Covid + (06/2020), Diabetes, CHF, Blood clots (on a thinner)  EMS vitals: 97.6 Temp 131/98 BP 182 CBG 70's HR 98% SPO2 on room air

## 2020-12-26 NOTE — Progress Notes (Signed)
Lower extremity venous RT study completed.  Preliminary results relayed to [place], [place].   See CV Proc for preliminary results report.   Jean Rosenthal, RDMS, RVT

## 2020-12-26 NOTE — ED Notes (Signed)
Family to bedside at this time.

## 2020-12-26 NOTE — ED Provider Notes (Signed)
Care assumed from PA Evelena Leyden, please see his note for full details, but in brief Randy Liu is a 70 y.o. male who presents from [facility] for evaluation of chills and weakness.  Patient was recently admitted to the hospital on 3/31 for cholangitis and was just discharged from rehab facility on Monday.  Today he had an episode of chills and has been feeling weak, patient wife became very concerned, because the had chills previously when cholangitis started, was having abdominal pain at that time, but currently denies any abdominal pain.  Does report decreased appetite recently and they question whether that could be contributing to weakness.  Occasional cough but no chest pain or shortness of breath.  Has chronic catheter in place.  Work-up thus far has been reassuring, patient without fever, tachycardia or any other abnormal vitals.  Chest x-ray is clear.  No leukocytosis or electrolyte derangements.  Normal renal and liver function.  Given reassuring abdominal exam, repeat abdominal imaging was not felt indicated by prior care team.  Plan: Follow-up urinalysis and Covid test if negative, can likely be reassured and sent back to rehab facility, blood cultures are pending.   Physical Exam  BP 117/64   Pulse 68   Temp 98.3 F (36.8 C) (Rectal)   Resp 18   SpO2 98%   Physical Exam Vitals and nursing note reviewed.  Constitutional:      General: He is not in acute distress.    Appearance: He is well-developed. He is not diaphoretic.     Comments: Elderly gentleman, chronically ill-appearing but in no acute distress, no rigors or chills noted currently  HENT:     Head: Normocephalic and atraumatic.  Eyes:     General:        Right eye: No discharge.        Left eye: No discharge.  Cardiovascular:     Rate and Rhythm: Normal rate and regular rhythm.  Pulmonary:     Effort: Pulmonary effort is normal. No respiratory distress.  Abdominal:     General: There  is no distension.     Palpations: Abdomen is soft. There is no mass.     Tenderness: There is no abdominal tenderness. There is no guarding.     Comments: Abdomen is protuberant but soft, bowel sounds present throughout, no focal tenderness or guarding, prior surgical scars noted.  Musculoskeletal:     Comments: There is some tenderness present in the right leg, no focal swelling, redness or wounds.  No effusion over the knee or ankle.  Skin:    General: Skin is warm and dry.     Comments: No erythema, wounds or signs of cellulitis noted.  Neurological:     Mental Status: He is alert.     Coordination: Coordination normal.  Psychiatric:        Behavior: Behavior normal.     ED Course/Procedures   Labs Reviewed  CBC WITH DIFFERENTIAL/PLATELET - Abnormal; Notable for the following components:      Result Value   RBC 3.90 (*)    Hemoglobin 12.4 (*)    MCV 101.0 (*)    All other components within normal limits  COMPREHENSIVE METABOLIC PANEL - Abnormal; Notable for the following components:   Glucose, Bld 216 (*)    Albumin 2.8 (*)    All other components within normal limits  URINALYSIS, ROUTINE W REFLEX MICROSCOPIC - Abnormal; Notable for the following components:   Color, Urine AMBER (*)  Glucose, UA 50 (*)    All other components within normal limits  RESP PANEL BY RT-PCR (FLU A&B, COVID) [id number]  CULTURE, BLOOD (ROUTINE X 2)  CULTURE, BLOOD (ROUTINE X 2)  URINE CULTURE   DG Chest Portable 1 View  Result Date: 12/26/2020 CLINICAL DATA:  Cough and chills EXAM: PORTABLE CHEST 1 VIEW COMPARISON:  December 23, 2020 FINDINGS: There is a left pleural effusion with left base atelectasis. Lungs elsewhere clear. Heart is upper normal in size with pulmonary vascularity normal. No adenopathy. Thoracic tracheal deviation less prominent on this study compared to most recent study. No bone lesions. There is aortic atherosclerosis. IMPRESSION: Left pleural effusion with left base atelectasis.  Lungs elsewhere clear. Stable cardiac silhouette Aortic Atherosclerosis (ICD10-I70.0). Electronically Signed   By: Bretta Bang III M.D.   On: 12/26/2020 12:55   VAS [place] LOWER EXTREMITY VENOUS (DVT) (ONLY [facility] & [facility] 7a-7p)  Result Date: 12/26/2020  Lower Venous DVT Study Indications: New pain behind RT knee.  Risk Factors: Recent extended hospital admission. Comparison       12-13-2020 Lower extremity venous bilateral was negative for Study:           DVT. Performing Technologist: Jean Rosenthal RDMS,RVT  Examination Guidelines: A complete evaluation includes B-mode imaging, spectral Doppler, color Doppler, and power Doppler as needed of all accessible portions of each vessel. Bilateral testing is considered an integral part of a complete examination. Limited examinations for reoccurring indications may be performed as noted. The reflux portion of the exam is performed with the patient in reverse Trendelenburg.  +---------+---------------+---------+-----------+----------+--------------+ RIGHT    CompressibilityPhasicitySpontaneityPropertiesThrombus Aging +---------+---------------+---------+-----------+----------+--------------+ CFV      Full           Yes      Yes                                 +---------+---------------+---------+-----------+----------+--------------+ SFJ      Full                                                        +---------+---------------+---------+-----------+----------+--------------+ FV Prox  Full                                                        +---------+---------------+---------+-----------+----------+--------------+ FV Mid   Full                                                        +---------+---------------+---------+-----------+----------+--------------+ FV DistalFull                                                        +---------+---------------+---------+-----------+----------+--------------+ PFV      Full                                                         +---------+---------------+---------+-----------+----------+--------------+  POP      Full           Yes      Yes                                 +---------+---------------+---------+-----------+----------+--------------+ PTV      Full                                                        +---------+---------------+---------+-----------+----------+--------------+ PERO     Full                                                        +---------+---------------+---------+-----------+----------+--------------+   +---------+---------------+---------+-----------+----------+--------------+ LEFT     CompressibilityPhasicitySpontaneityPropertiesThrombus Aging +---------+---------------+---------+-----------+----------+--------------+ CFV      Full           Yes      Yes                                 +---------+---------------+---------+-----------+----------+--------------+ SFJ      Full                                                        +---------+---------------+---------+-----------+----------+--------------+ FV Prox  Full                                                        +---------+---------------+---------+-----------+----------+--------------+ FV Mid   Full                                                        +---------+---------------+---------+-----------+----------+--------------+ FV DistalFull                                                        +---------+---------------+---------+-----------+----------+--------------+ PFV      Full                                                        +---------+---------------+---------+-----------+----------+--------------+ POP      Full           Yes      Yes                                 +---------+---------------+---------+-----------+----------+--------------+  PTV      Full                                                         +---------+---------------+---------+-----------+----------+--------------+ PERO     Full                                                        +---------+---------------+---------+-----------+----------+--------------+     Summary: RIGHT: - There is no evidence of deep vein thrombosis in the lower extremity.  - No cystic structure found in the popliteal fossa.  LEFT: - There is no evidence of deep vein thrombosis in the lower extremity.  - No cystic structure found in the popliteal fossa.  *See table(s) above for measurements and observations. Electronically signed by Heath Lark on 12/26/2020 at 5:14:48 PM.    Final      Procedures  MDM   Urinalysis without signs of infection.  Negative Covid and influenza screening.  Throughout ED stay patient has maintained normal vitals, recheck of temp with no fever.  Wife is very concerned given these episode of chills, but his abdominal exam remains benign with no tenderness.  Discussed reassuring work-up  Dr. Silverio Lay saw and evaluated patient and discussed reassuring work-up with patient and his wife as well.  Blood cultures are pending and they are aware they will be called if these returned positive but at this time feel patient is stable to return to her rehab facility where he will continue to have frequent vital checks and close monitoring.  Patient and wife expressed understanding and agreement. PTAR: To arrange transport back to rehab facility.  BP 120/60   Pulse 74   Temp 98.2 F (36.8 C) (Oral)   Resp 18   SpO2 94%      Legrand Rams 12/26/20 2211    Charlynne Pander, MD 12/26/20 2312

## 2020-12-27 NOTE — ED Notes (Signed)
Pt is off unit

## 2020-12-28 LAB — URINE CULTURE: Culture: 30000 — AB

## 2020-12-29 ENCOUNTER — Telehealth (HOSPITAL_BASED_OUTPATIENT_CLINIC_OR_DEPARTMENT_OTHER): Payer: Self-pay | Admitting: Emergency Medicine

## 2020-12-29 NOTE — Telephone Encounter (Signed)
Post ED Visit - Positive Culture Follow-up  Culture report reviewed by antimicrobial stewardship pharmacist: [facility] []  , Pharm.D. []  Enzo Bi, Pharm.D., BCPS AQ-ID []  , Pharm.D., BCPS []  Celedonio Miyamoto, Pharm.D., BCPS []  Lyman, Garvin Fila.D., BCPS, AAHIVP []  , Pharm.D., BCPS, AAHIVP []  Georgina Pillion, PharmD, BCPS []  , PharmD, BCPS []  [place], PharmD, BCPS []  [address], PharmD, BCPS []  , PharmD  [facility] []  , PharmD []  Lysle Pearl, PharmD []  , PharmD []  Phillips Climes, Rph []  ) Agapito Games, PharmD []  , PharmD []  Mervyn Gay, PharmD []  , PharmD []  Vinnie Level, PharmD [x]  [facility], PharmD []  , PharmD []  Len Childs, PharmD []  , PharmD   Positive urine culture No further patient follow-up is required at this time.  Greer Pickerel Corrinne Benegas 12/29/2020, 12:06 PM

## 2020-12-31 LAB — CULTURE, BLOOD (ROUTINE X 2)
Culture: NO GROWTH
Special Requests: ADEQUATE

## 2021-05-01 ENCOUNTER — Emergency Department (HOSPITAL_BASED_OUTPATIENT_CLINIC_OR_DEPARTMENT_OTHER): Payer: Medicare Other

## 2021-05-01 ENCOUNTER — Inpatient Hospital Stay (HOSPITAL_BASED_OUTPATIENT_CLINIC_OR_DEPARTMENT_OTHER)
Admission: EM | Admit: 2021-05-01 | Discharge: 2021-05-09 | DRG: 388 | Disposition: A | Discharge status: Skilled Nursing Facility | Payer: Medicare Other | Attending: Internal Medicine | Admitting: Internal Medicine

## 2021-05-01 ENCOUNTER — Encounter (HOSPITAL_BASED_OUTPATIENT_CLINIC_OR_DEPARTMENT_OTHER): Payer: Self-pay

## 2021-05-01 ENCOUNTER — Other Ambulatory Visit: Payer: Self-pay

## 2021-05-01 DIAGNOSIS — I130 Hypertensive heart and chronic kidney disease with heart failure and stage 1 through stage 4 chronic kidney disease, or unspecified chronic kidney disease: Secondary | ICD-10-CM | POA: Diagnosis present

## 2021-05-01 DIAGNOSIS — Z20822 Contact with and (suspected) exposure to covid-19: Secondary | ICD-10-CM | POA: Diagnosis present

## 2021-05-01 DIAGNOSIS — K7581 Nonalcoholic steatohepatitis (NASH): Secondary | ICD-10-CM | POA: Diagnosis present

## 2021-05-01 DIAGNOSIS — Z7901 Long term (current) use of anticoagulants: Secondary | ICD-10-CM

## 2021-05-01 DIAGNOSIS — J45909 Unspecified asthma, uncomplicated: Secondary | ICD-10-CM | POA: Diagnosis present

## 2021-05-01 DIAGNOSIS — Z880 Allergy status to penicillin: Secondary | ICD-10-CM | POA: Diagnosis not present

## 2021-05-01 DIAGNOSIS — Z6834 Body mass index (BMI) 34.0-34.9, adult: Secondary | ICD-10-CM

## 2021-05-01 DIAGNOSIS — N1831 Chronic kidney disease, stage 3a: Secondary | ICD-10-CM | POA: Diagnosis present

## 2021-05-01 DIAGNOSIS — G4733 Obstructive sleep apnea (adult) (pediatric): Secondary | ICD-10-CM | POA: Diagnosis present

## 2021-05-01 DIAGNOSIS — E785 Hyperlipidemia, unspecified: Secondary | ICD-10-CM | POA: Diagnosis present

## 2021-05-01 DIAGNOSIS — K635 Polyp of colon: Secondary | ICD-10-CM | POA: Diagnosis present

## 2021-05-01 DIAGNOSIS — K921 Melena: Secondary | ICD-10-CM

## 2021-05-01 DIAGNOSIS — Z8616 Personal history of COVID-19: Secondary | ICD-10-CM | POA: Diagnosis not present

## 2021-05-01 DIAGNOSIS — Z87891 Personal history of nicotine dependence: Secondary | ICD-10-CM

## 2021-05-01 DIAGNOSIS — Z886 Allergy status to analgesic agent status: Secondary | ICD-10-CM

## 2021-05-01 DIAGNOSIS — E669 Obesity, unspecified: Secondary | ICD-10-CM | POA: Diagnosis present

## 2021-05-01 DIAGNOSIS — K219 Gastro-esophageal reflux disease without esophagitis: Secondary | ICD-10-CM | POA: Diagnosis present

## 2021-05-01 DIAGNOSIS — Z86718 Personal history of other venous thrombosis and embolism: Secondary | ICD-10-CM

## 2021-05-01 DIAGNOSIS — Z79899 Other long term (current) drug therapy: Secondary | ICD-10-CM

## 2021-05-01 DIAGNOSIS — E042 Nontoxic multinodular goiter: Secondary | ICD-10-CM | POA: Diagnosis present

## 2021-05-01 DIAGNOSIS — E1122 Type 2 diabetes mellitus with diabetic chronic kidney disease: Secondary | ICD-10-CM | POA: Diagnosis present

## 2021-05-01 DIAGNOSIS — R532 Functional quadriplegia: Secondary | ICD-10-CM | POA: Diagnosis present

## 2021-05-01 DIAGNOSIS — E1143 Type 2 diabetes mellitus with diabetic autonomic (poly)neuropathy: Secondary | ICD-10-CM | POA: Diagnosis present

## 2021-05-01 DIAGNOSIS — Z888 Allergy status to other drugs, medicaments and biological substances status: Secondary | ICD-10-CM

## 2021-05-01 DIAGNOSIS — Z0189 Encounter for other specified special examinations: Secondary | ICD-10-CM

## 2021-05-01 DIAGNOSIS — R9431 Abnormal electrocardiogram [ECG] [EKG]: Secondary | ICD-10-CM | POA: Diagnosis present

## 2021-05-01 DIAGNOSIS — E876 Hypokalemia: Secondary | ICD-10-CM | POA: Diagnosis not present

## 2021-05-01 DIAGNOSIS — K589 Irritable bowel syndrome without diarrhea: Secondary | ICD-10-CM | POA: Diagnosis present

## 2021-05-01 DIAGNOSIS — Z993 Dependence on wheelchair: Secondary | ICD-10-CM

## 2021-05-01 DIAGNOSIS — Z8719 Personal history of other diseases of the digestive system: Secondary | ICD-10-CM

## 2021-05-01 DIAGNOSIS — I5032 Chronic diastolic (congestive) heart failure: Secondary | ICD-10-CM | POA: Diagnosis present

## 2021-05-01 DIAGNOSIS — K56609 Unspecified intestinal obstruction, unspecified as to partial versus complete obstruction: Secondary | ICD-10-CM | POA: Diagnosis present

## 2021-05-01 DIAGNOSIS — K567 Ileus, unspecified: Secondary | ICD-10-CM | POA: Diagnosis present

## 2021-05-01 DIAGNOSIS — J189 Pneumonia, unspecified organism: Secondary | ICD-10-CM

## 2021-05-01 DIAGNOSIS — K3184 Gastroparesis: Secondary | ICD-10-CM | POA: Diagnosis present

## 2021-05-01 DIAGNOSIS — K5651 Intestinal adhesions [bands], with partial obstruction: Secondary | ICD-10-CM | POA: Diagnosis present

## 2021-05-01 DIAGNOSIS — M109 Gout, unspecified: Secondary | ICD-10-CM | POA: Diagnosis present

## 2021-05-01 DIAGNOSIS — Z9049 Acquired absence of other specified parts of digestive tract: Secondary | ICD-10-CM

## 2021-05-01 DIAGNOSIS — Z86711 Personal history of pulmonary embolism: Secondary | ICD-10-CM

## 2021-05-01 DIAGNOSIS — Z794 Long term (current) use of insulin: Secondary | ICD-10-CM

## 2021-05-01 LAB — CBC WITH DIFFERENTIAL/PLATELET
Abs Immature Granulocytes: 0.03 10*3/uL (ref 0.00–0.07)
Basophils Absolute: 0 10*3/uL (ref 0.0–0.1)
Basophils Relative: 0 %
Eosinophils Absolute: 0.1 10*3/uL (ref 0.0–0.5)
Eosinophils Relative: 1 %
HCT: 43 % (ref 39.0–52.0)
Hemoglobin: 14.3 g/dL (ref 13.0–17.0)
Immature Granulocytes: 0 %
Lymphocytes Relative: 18 %
Lymphs Abs: 1.4 10*3/uL (ref 0.7–4.0)
MCH: 31.4 pg (ref 26.0–34.0)
MCHC: 33.3 g/dL (ref 30.0–36.0)
MCV: 94.3 fL (ref 80.0–100.0)
Monocytes Absolute: 0.5 10*3/uL (ref 0.1–1.0)
Monocytes Relative: 6 %
Neutro Abs: 5.7 10*3/uL (ref 1.7–7.7)
Neutrophils Relative %: 75 %
Platelets: 257 10*3/uL (ref 150–400)
RBC: 4.56 MIL/uL (ref 4.22–5.81)
RDW: 14.8 % (ref 11.5–15.5)
WBC: 7.7 10*3/uL (ref 4.0–10.5)
nRBC: 0 % (ref 0.0–0.2)

## 2021-05-01 LAB — GLUCOSE, CAPILLARY: Glucose-Capillary: 214 mg/dL — ABNORMAL HIGH (ref 70–99)

## 2021-05-01 LAB — COMPREHENSIVE METABOLIC PANEL
ALT: 33 U/L (ref 0–44)
AST: 36 U/L (ref 15–41)
Albumin: 3.7 g/dL (ref 3.5–5.0)
Alkaline Phosphatase: 41 U/L (ref 38–126)
Anion gap: 11 (ref 5–15)
BUN: 26 mg/dL — ABNORMAL HIGH (ref 8–23)
CO2: 26 mmol/L (ref 22–32)
Calcium: 9.5 mg/dL (ref 8.9–10.3)
Chloride: 99 mmol/L (ref 98–111)
Creatinine, Ser: 1.34 mg/dL — ABNORMAL HIGH (ref 0.61–1.24)
GFR, Estimated: 56 mL/min — ABNORMAL LOW (ref >60)
Glucose, Bld: 250 mg/dL — ABNORMAL HIGH (ref 70–99)
Potassium: 4.3 mmol/L (ref 3.5–5.1)
Sodium: 136 mmol/L (ref 135–145)
Total Bilirubin: 0.6 mg/dL (ref 0.3–1.2)
Total Protein: 7.1 g/dL (ref 6.5–8.1)

## 2021-05-01 LAB — APTT: aPTT: 34 seconds (ref 24–36)

## 2021-05-01 LAB — HEPARIN LEVEL (UNFRACTIONATED): Heparin Unfractionated: >1.1 IU/mL — ABNORMAL HIGH (ref 0.30–0.70)

## 2021-05-01 LAB — RESP PANEL BY RT-PCR (FLU A&B, COVID) ARPGX2
Influenza A by PCR: NEGATIVE
Influenza B by PCR: NEGATIVE
SARS Coronavirus 2 by RT PCR: NEGATIVE

## 2021-05-01 LAB — LIPASE, BLOOD: Lipase: 19 U/L (ref 11–51)

## 2021-05-01 MED ORDER — PHENOL 1.4 % MT LIQD
2.0000 | OROMUCOSAL | Status: DC | PRN
  Administered 2021-05-03: 2 via OROMUCOSAL
  Filled 2021-05-01: qty 177

## 2021-05-01 MED ORDER — LACTATED RINGERS IV BOLUS
1000.0000 mL | Freq: Once | INTRAVENOUS | Status: AC
  Administered 2021-05-01: 1000 mL via INTRAVENOUS

## 2021-05-01 MED ORDER — SIMETHICONE 40 MG/0.6ML PO SUSP
80.0000 mg | Freq: Four times a day (QID) | ORAL | Status: DC | PRN
  Filled 2021-05-01: qty 1.2

## 2021-05-01 MED ORDER — LACTATED RINGERS IV BOLUS
500.0000 mL | Freq: Once | INTRAVENOUS | Status: AC
  Administered 2021-05-01: 500 mL via INTRAVENOUS

## 2021-05-01 MED ORDER — LIP MEDEX EX OINT
1.0000 "application " | TOPICAL_OINTMENT | Freq: Two times a day (BID) | CUTANEOUS | Status: DC
  Administered 2021-05-01 – 2021-05-09 (×13): 1 via TOPICAL
  Filled 2021-05-01 (×3): qty 7

## 2021-05-01 MED ORDER — GUAIFENESIN-DM 100-10 MG/5ML PO SYRP: 10.0000 mL | ORAL_SOLUTION | ORAL | Status: DC | PRN

## 2021-05-01 MED ORDER — INSULIN ASPART PROT & ASPART (70-30 MIX) 100 UNIT/ML ~~LOC~~ SUSP
12.0000 [IU] | Freq: Two times a day (BID) | SUBCUTANEOUS | Status: DC
  Administered 2021-05-03 – 2021-05-05 (×5): 12 [IU] via SUBCUTANEOUS
  Filled 2021-05-01: qty 10

## 2021-05-01 MED ORDER — IOHEXOL 300 MG/ML  SOLN
100.0000 mL | Freq: Once | INTRAMUSCULAR | Status: AC | PRN
  Administered 2021-05-01: 100 mL via INTRAVENOUS

## 2021-05-01 MED ORDER — HEPARIN (PORCINE) 25000 UT/250ML-% IV SOLN
1600.0000 [IU]/h | INTRAVENOUS | Status: AC
  Administered 2021-05-01 – 2021-05-03 (×3): 1700 [IU]/h via INTRAVENOUS
  Administered 2021-05-03: 1500 [IU]/h via INTRAVENOUS
  Administered 2021-05-04 – 2021-05-05 (×2): 1600 [IU]/h via INTRAVENOUS
  Filled 2021-05-01 (×7): qty 250

## 2021-05-01 MED ORDER — MAGIC MOUTHWASH
15.0000 mL | Freq: Four times a day (QID) | ORAL | Status: DC | PRN
  Filled 2021-05-01: qty 15

## 2021-05-01 MED ORDER — METHOCARBAMOL 1000 MG/10ML IJ SOLN
1000.0000 mg | Freq: Four times a day (QID) | INTRAVENOUS | Status: DC | PRN
  Filled 2021-05-01: qty 10

## 2021-05-01 MED ORDER — DIPHENHYDRAMINE HCL 50 MG/ML IJ SOLN: 12.5000 mg | Freq: Four times a day (QID) | INTRAMUSCULAR | Status: DC | PRN

## 2021-05-01 MED ORDER — ACETAMINOPHEN 650 MG RE SUPP: 650.0000 mg | Freq: Four times a day (QID) | RECTAL | Status: DC | PRN

## 2021-05-01 MED ORDER — HYDROCORTISONE (PERIANAL) 2.5 % EX CREA
1.0000 "application " | TOPICAL_CREAM | Freq: Four times a day (QID) | CUTANEOUS | Status: DC | PRN
  Filled 2021-05-01: qty 28.35

## 2021-05-01 MED ORDER — PANTOPRAZOLE SODIUM 40 MG IV SOLR
40.0000 mg | Freq: Two times a day (BID) | INTRAVENOUS | Status: DC
  Administered 2021-05-01 – 2021-05-05 (×8): 40 mg via INTRAVENOUS
  Filled 2021-05-01 (×8): qty 40

## 2021-05-01 MED ORDER — ONDANSETRON HCL 4 MG/2ML IJ SOLN
4.0000 mg | Freq: Four times a day (QID) | INTRAMUSCULAR | Status: DC | PRN
  Administered 2021-05-09: 4 mg via INTRAVENOUS

## 2021-05-01 MED ORDER — SODIUM CHLORIDE 0.9 % IV BOLUS
500.0000 mL | Freq: Once | INTRAVENOUS | Status: AC
  Administered 2021-05-01: 500 mL via INTRAVENOUS

## 2021-05-01 MED ORDER — BISACODYL 10 MG RE SUPP
10.0000 mg | Freq: Every day | RECTAL | Status: DC
  Administered 2021-05-01 – 2021-05-07 (×6): 10 mg via RECTAL
  Filled 2021-05-01 (×7): qty 1

## 2021-05-01 MED ORDER — SODIUM CHLORIDE 0.9 % IV SOLN
8.0000 mg | Freq: Four times a day (QID) | INTRAVENOUS | Status: DC | PRN
  Filled 2021-05-01: qty 4

## 2021-05-01 MED ORDER — PROCHLORPERAZINE EDISYLATE 10 MG/2ML IJ SOLN: 5.0000 mg | INTRAMUSCULAR | Status: DC | PRN

## 2021-05-01 MED ORDER — INSULIN ASPART 100 UNIT/ML IJ SOLN
0.0000 [IU] | INTRAMUSCULAR | Status: DC
  Administered 2021-05-01: 2 [IU] via SUBCUTANEOUS
  Administered 2021-05-02: 1 [IU] via SUBCUTANEOUS
  Administered 2021-05-02 (×2): 2 [IU] via SUBCUTANEOUS
  Administered 2021-05-02 (×3): 1 [IU] via SUBCUTANEOUS
  Administered 2021-05-03: 2 [IU] via SUBCUTANEOUS
  Administered 2021-05-03 (×4): 1 [IU] via SUBCUTANEOUS
  Administered 2021-05-04 (×2): 2 [IU] via SUBCUTANEOUS
  Administered 2021-05-04: 1 [IU] via SUBCUTANEOUS
  Administered 2021-05-04: 2 [IU] via SUBCUTANEOUS
  Administered 2021-05-04 – 2021-05-05 (×6): 1 [IU] via SUBCUTANEOUS
  Administered 2021-05-05 (×2): 2 [IU] via SUBCUTANEOUS
  Administered 2021-05-06 (×2): 1 [IU] via SUBCUTANEOUS
  Administered 2021-05-06: 2 [IU] via SUBCUTANEOUS
  Administered 2021-05-06: 1 [IU] via SUBCUTANEOUS
  Administered 2021-05-06: 2 [IU] via SUBCUTANEOUS
  Administered 2021-05-06 – 2021-05-09 (×8): 1 [IU] via SUBCUTANEOUS

## 2021-05-01 MED ORDER — DIATRIZOATE MEGLUMINE & SODIUM 66-10 % PO SOLN
90.0000 mL | Freq: Once | ORAL | Status: AC
  Administered 2021-05-01: 90 mL via NASOGASTRIC
  Filled 2021-05-01: qty 90

## 2021-05-01 MED ORDER — HYDROCORTISONE 1 % EX CREA
1.0000 "application " | TOPICAL_CREAM | Freq: Three times a day (TID) | CUTANEOUS | Status: DC | PRN
  Filled 2021-05-01: qty 28

## 2021-05-01 MED ORDER — RIVAROXABAN 10 MG PO TABS: 10.0000 mg | ORAL_TABLET | Freq: Every day | ORAL | Status: DC

## 2021-05-01 MED ORDER — ONDANSETRON HCL 4 MG/2ML IJ SOLN
4.0000 mg | Freq: Once | INTRAMUSCULAR | Status: AC
  Administered 2021-05-01: 4 mg via INTRAVENOUS
  Filled 2021-05-01: qty 2

## 2021-05-01 MED ORDER — HEPARIN (PORCINE) 25000 UT/250ML-% IV SOLN: 1700.0000 [IU]/h | INTRAVENOUS | Status: DC

## 2021-05-01 MED ORDER — MENTHOL 3 MG MT LOZG
1.0000 | LOZENGE | OROMUCOSAL | Status: DC | PRN
  Administered 2021-05-04: 3 mg via ORAL
  Filled 2021-05-01: qty 9

## 2021-05-01 MED ORDER — LACTATED RINGERS IV BOLUS: 1000.0000 mL | Freq: Three times a day (TID) | INTRAVENOUS | Status: DC | PRN

## 2021-05-01 MED ORDER — ALUM & MAG HYDROXIDE-SIMETH 200-200-20 MG/5ML PO SUSP: 30.0000 mL | Freq: Four times a day (QID) | ORAL | Status: DC | PRN

## 2021-05-01 MED ORDER — HYDROMORPHONE HCL 1 MG/ML IJ SOLN
0.5000 mg | INTRAMUSCULAR | Status: DC | PRN
  Administered 2021-05-08: 1 mg via INTRAVENOUS
  Filled 2021-05-01: qty 1

## 2021-05-01 NOTE — Plan of Care (Signed)
Originating [facility]: [facility] Requesting Physician/APP: Dr. Dalene Seltzer, MD  History: Randy Liu is a 70 y.o. male with a PMHx of HTN, CKDIII, Hx of pulmonary embolus on anticoag, OSA, Diabetic gastroparesis, DMII with neuropathy and LT insulin, Restrictive Lung disease, IBS, hepatic stenosis, cholangitis, GERD, pancreatic lesion, thyroid nodules, hepatic stenosis, gout, B12 deficiency, obesity who presented to med [facility] ED with nausea/vomiting, diarrhea and abdominal pain.  CT imaging notable for small bowel obstruction with transition point mid lower abdomen.  EDP placing NG tube.  Case was discussed with general surgery, Randy Liu with recommendations of transfer to [facility] and general surgery Randy consult on patient's arrival.  Plan of Care:  --Admit to inpatient, Med Surg Floor --Call general surgery on patient's arrival   Please call the [facility] Admits & Consults System-Wide number at the top of Amion at the time of the patient's arrival to the floor.  Randy Areola [place], DO

## 2021-05-01 NOTE — ED Notes (Signed)
Patient transported to CT 

## 2021-05-01 NOTE — ED Notes (Signed)
[facility] at bedside 

## 2021-05-01 NOTE — Progress Notes (Signed)
Pt currently has NG tube in place.  Tomorrow, patient's wife will bring in his cpap machine with nasal pillows from home.  RT will follow and assist as needed.

## 2021-05-01 NOTE — Consult Note (Signed)
[facility]  Randy PippinMichael Cozzolino 1951/06/06  [id number].    Patient Care Team: Felton ClintonWeston, Eileen, MD as PCP - General (Internal Medicine) Arlean Hoppingriadhosp, Wladmits, MD as Consulting Physician (Internal Medicine) Darnell LevelGerkin, Todd, MD as Consulting Physician (General Surgery) Lynann BolognaGupta, Rajesh, MD as Consulting Physician (Gastroenterology)   Requesting MD: Alvira MondayErin schlossman Chief Complaint: Nausea, vomiting, diarrhea Reason for Consult: SBO  HPI:  Patient is a 70 year old male who presented from home today with complaints nausea, vomiting, diarrhea that started night prior.  He has not eaten since for at least 24 hours because of his nausea and vomiting.  He was recently hospitalized 4/1 - 12/23/20, with sepsis secondary to suspected cholangitis.  At the time of admission he was in acute hypoxic respiratory failure with sats in the 80s on room air, mild hyponatremia, type 2 insulin-dependent diabetes, Hypertension, stage III kidney disease, obstructive sleep apnea, on CPAP at night.  QT prolongation is also been wheelchair-bound since a bout of COVID the year prior.  He had severe morbid obesity with a BMI of 38, he was on Eliquis for PE and had UTI.  Blood cultures also showed Klebsiella pneumonia bacteremia.  He was found to have choledocholithiasis and underwent ERCP on 12/14/2020.  On 12/17/2020 he underwent laparoscopic cholecystectomy, laparoscopic liver biopsy and primary repair of umbilical hernia.  Biopsies showed mildly active steatohepatitis grade 1 of 3 with cholestasis.  He was ultimately discharged home to a SNF on 12/23/2020.  He returns today with the above-noted complaints.  Work-up shows he is afebrile vital signs were stable.  Sats are good on room air.  Patient notes he had an episode of a bowel blockage before that resolved without surgery.  He has some nausea but no severe pain.  No fevers or chills.  No bleeding. Labs shows electrolytes are normal, glucose 250, BUN 26,  creatinine 1.34, lipase 19, LFTs are normal, GFR is 56, WBC 7.7,  H/H14.3/43.0, platelets 257,000.  Respiratory panel was negative. CT of the abdomen today shows multiple dilated loops of small bowel with gradual transition point in the mid lower abdomen and decompressed loops of distal small bowel findings are compatible with a small bowel obstruction no evidence of pneumatosis, free air, free fluid or bowel wall hypoenhancement.  He is being admitted by medicine.  [facility].  We are asked to see. ROS: ROS as noted per HPI.  History reviewed. No pertinent family history.  Past Medical History:  Diagnosis Date   Asthma    CHF (congestive heart failure) (HCC)    Diabetes mellitus without complication (HCC)    Hypertension     Past Surgical History:  Procedure Laterality Date   BIOPSY  12/14/2020   Procedure: BIOPSY;  Surgeon: Lynann BolognaGupta, Rajesh, MD;  Location: [facility];  Service: Endoscopy;;   CHOLECYSTECTOMY N/A 12/17/2020   Procedure: LAPAROSCOPIC CHOLECYSTECTOMY, LIVER BIOPSY, PRIMARY UMBILICAL HERNIA REPAIR;  Surgeon: Darnell LevelGerkin, Todd, MD;  Location: [facility];  Service: General;  Laterality: N/A;   ENDOSCOPIC RETROGRADE CHOLANGIOPANCREATOGRAPHY (ERCP) WITH PROPOFOL N/A 12/14/2020   Procedure: ENDOSCOPIC RETROGRADE CHOLANGIOPANCREATOGRAPHY (ERCP) WITH PROPOFOL;  Surgeon: Lynann BolognaGupta, Rajesh, MD;  Location: [facility];  Service: Endoscopy;  Laterality: N/A;   REMOVAL OF STONES  12/14/2020   Procedure: REMOVAL OF STONES;  Surgeon: Lynann BolognaGupta, Rajesh, MD;  Location: [facility];  Service: Endoscopy;;   SPHINCTEROTOMY  12/14/2020   Procedure: Dennison MascotSPHINCTEROTOMY;  Surgeon: Lynann BolognaGupta, Rajesh, MD;  Location: [facility];  Service: Endoscopy;;  Social History:  reports that he has quit smoking. He has never used smokeless tobacco. He reports that he does not currently use alcohol. He reports that he does not use drugs.  Allergies:  Allergies  Allergen Reactions   Metoclopramide  Itching    Loss of Balance; Urinary Incontinence   Nsaids     Other reaction(s): Other (See Comments) Renal Insufficiency Kidney issues    Amoxicillin-Pot Clavulanate Diarrhea and Nausea And Vomiting    Other reaction(s): Other (See Comments) Abdomen Pain Abdomen pain    Statins     Other reaction(s): Other (See Comments) Myalgias and Myositis myalgias    Allopurinol Rash   Empagliflozin     Other reaction(s): Other (See Comments) Fainting, weakness, lightheaded, falling   Tiotropium     Other reaction(s): Other (See Comments) Urinary retention    (Not in a hospital admission)   Blood pressure (!) 148/75, pulse 82, temperature 99.2 F (37.3 C), temperature source Rectal, resp. rate 18, height 6\' 1"  (1.854 m), weight 119.3 kg, SpO2 94 %. Physical Exam: [facility] [place] today with complaints of General: pleasant, WD, WN white male who is laying in bed in NAD Eyes: Sclera are noninjected.  PERRL HENT: head is normocephalic, atraumatic.  .  Ears and nose without any masses or lesions.  Mouth is pink and moist Heart: regular, rate, and rhythm.  Normal s1,s2. No obvious murmurs, gallops, or rubs noted.  Palpable radial and pedal pulses bilaterally Lungs: CTAB, no wheezes, rhonchi, or rales noted.  Respiratory effort nonlabored Abd: Obese.  Moderately distended soft, NT, no masses, hernias, or organomegaly MS: all 4 extremities are symmetrical with no cyanosis, clubbing, or edema. Skin: warm and dry with no masses, lesions, or rashes Neuro: Cranial nerves 2-12 grossly intact, sensation is normal throughout Psych: A&Ox3 with an appropriate affect.   Results for orders placed or performed during the hospital encounter of 05/01/21 (from the past 48 hour(s))  CBC with Differential     Status: None   Collection Time: 05/01/21 11:50 AM  Result Value Ref Range   WBC 7.7 4.0 - 10.5 K/uL   RBC 4.56 4.22 - 5.81 MIL/uL   Hemoglobin 14.3 13.0 - 17.0 g/dL   HCT 05/03/21 17.6 - 16.0 %    MCV 94.3 80.0 - 100.0 fL   MCH 31.4 26.0 - 34.0 pg   MCHC 33.3 30.0 - 36.0 g/dL   RDW 73.7 10.6 - 26.9 %   Platelets 257 150 - 400 K/uL   nRBC 0.0 0.0 - 0.2 %   Neutrophils Relative % 75 %   Neutro Abs 5.7 1.7 - 7.7 K/uL   Lymphocytes Relative 18 %   Lymphs Abs 1.4 0.7 - 4.0 K/uL   Monocytes Relative 6 %   Monocytes Absolute 0.5 0.1 - 1.0 K/uL   Eosinophils Relative 1 %   Eosinophils Absolute 0.1 0.0 - 0.5 K/uL   Basophils Relative 0 %   Basophils Absolute 0.0 0.0 - 0.1 K/uL   Immature Granulocytes 0 %   Abs Immature Granulocytes 0.03 0.00 - 0.07 K/uL    Comment: Performed at [facility], [address] [place], [place]  Comprehensive metabolic panel     Status: Abnormal   Collection Time: 05/01/21 11:50 AM  Result Value Ref Range   Sodium 136 135 - 145 mmol/L   Potassium 4.3 3.5 - 5.1 mmol/L   Chloride 99 98 - 111 mmol/L   CO2 26 22 - 32 mmol/L  Glucose, Bld 250 (H) 70 - 99 mg/dL    Comment: Glucose reference range applies only to samples taken after fasting for at least 8 hours.   BUN 26 (H) 8 - 23 mg/dL   Creatinine, Ser 0.34 (H) 0.61 - 1.24 mg/dL   Calcium 9.5 8.9 - 74.2 mg/dL   Total Protein 7.1 6.5 - 8.1 g/dL   Albumin 3.7 3.5 - 5.0 g/dL   AST 36 15 - 41 U/L   ALT 33 0 - 44 U/L   Alkaline Phosphatase 41 38 - 126 U/L   Total Bilirubin 0.6 0.3 - 1.2 mg/dL   GFR, Estimated 56 (L) >60 mL/min    Comment: (NOTE) Calculated using the CKD-EPI Creatinine Equation (2021)    Anion gap 11 5 - 15    Comment: Performed at [facility], [address] [place], [place] [address]  Lipase, blood     Status: None   Collection Time: 05/01/21 11:50 AM  Result Value Ref Range   Lipase 19 11 - 51 U/L    Comment: Performed at [facility], [address] [place], [place] [address]  Resp Panel by RT-PCR (Flu A&B, Covid) Nasopharyngeal Swab     Status: None   Collection Time: 05/01/21  2:35 PM   Specimen: Nasopharyngeal Swab;  Nasopharyngeal(NP) swabs in vial transport medium  Result Value Ref Range   SARS Coronavirus 2 by RT PCR NEGATIVE NEGATIVE    Comment: (NOTE) SARS-CoV-2 target nucleic acids are NOT DETECTED.  The SARS-CoV-2 RNA is generally detectable in upper respiratory specimens during the acute phase of infection. The lowest concentration of SARS-CoV-2 viral copies this assay can detect is 138 copies/mL. A negative result does not preclude SARS-Cov-2 infection and should not be used as the sole basis for treatment or other patient management decisions. A negative result may occur with  improper specimen collection/handling, submission of specimen other than nasopharyngeal swab, presence of viral mutation(s) within the areas targeted by this assay, and inadequate number of viral copies(<138 copies/mL). A negative result must be combined with clinical observations, patient history, and epidemiological information. The expected result is Negative.  Fact Sheet for Patients:  BloggerCourse.com  Fact Sheet for Healthcare Providers:  SeriousBroker.it  This test is no t yet approved or cleared by the [place] FDA and  has been authorized for detection and/or diagnosis of SARS-CoV-2 by FDA under an Emergency Use Authorization (EUA). This EUA will remain  in effect (meaning this test can be used) for the duration of the COVID-19 declaration under Section 564(b)(1) of the Act, 21 U.S.C.section 360bbb-3(b)(1), unless the authorization is terminated  or revoked sooner.       Influenza A by PCR NEGATIVE NEGATIVE   Influenza B by PCR NEGATIVE NEGATIVE    Comment: (NOTE) The Xpert Xpress SARS-CoV-2/FLU/RSV plus assay is intended as an aid in the diagnosis of influenza from Nasopharyngeal swab specimens and should not be used as a sole basis for treatment. Nasal washings and aspirates are unacceptable for Xpert Xpress  SARS-CoV-2/FLU/RSV testing.  Fact Sheet for Patients: BloggerCourse.com  Fact Sheet for Healthcare Providers: SeriousBroker.it  This test is not yet approved or cleared by the [place] FDA and has been authorized for detection and/or diagnosis of SARS-CoV-2 by FDA under an Emergency Use Authorization (EUA). This EUA will remain in effect (meaning this test can be used) for the duration of the COVID-19 declaration under Section 564(b)(1) of the Act, 21 U.S.C. section 360bbb-3(b)(1),  unless the authorization is terminated or revoked.  Performed at [facility], [address] [place], [place] [address]    CT ABDOMEN PELVIS W CONTRAST  Result Date: 05/01/2021 CLINICAL DATA:  Abdominal pain EXAM: CT ABDOMEN AND PELVIS WITH CONTRAST TECHNIQUE: Multidetector CT imaging of the abdomen and pelvis was performed using the standard protocol following bolus administration of intravenous contrast. CONTRAST:  OMNIPAQUE IOHEXOL 300 MG/ML  SOLN COMPARISON:  CT abdomen pelvis dated December 12, 2020 FINDINGS: Lower chest: Partially visualized small pericardial effusion which is similar to prior CT. Hepatobiliary: No focal liver abnormality is seen. Status post cholecystectomy. No biliary dilatation. Pancreas: Unremarkable. No pancreatic ductal dilatation or surrounding inflammatory changes. Spleen: Normal in size without focal abnormality. Adrenals/Urinary Tract: Normal bilateral adrenal glands. Bilateral exophytic renal cysts. No hydronephrosis. Bladder is unremarkable. Stomach/Bowel: Stomach is unremarkable. Multiple dilated loops of small bowel with gradual transition point in lower mid abdomen seen on series 3, image 176. And decompressed loops of distal small bowel. No evidence of pneumatosis, free air, free fluid or bowel wall hypoenhancement. Vascular/Lymphatic: Aortic atherosclerosis. No enlarged abdominal or pelvic lymph nodes.  Reproductive: Prostate is unremarkable. Other: Small fat containing left inguinal hernia. No abdominopelvic ascites. Musculoskeletal: No acute or significant osseous findings. IMPRESSION: Multiple dilated loops of small bowel with gradual transition point in the mid lower abdomen and decompressed loops of distal small bowel, findings are compatible small bowel obstruction. No evidence of pneumatosis, free air, free fluid or bowel wall hypoenhancement. Electronically Signed   By: Allegra Lai M.D.   On: 05/01/2021 14:15      Assessment/Plan  Abdominal pain with nausea, vomiting, and diarrhea SBO S/P laparoscopic cholecystectomy, laparoscopic liver biopsy, primary umbilical  hernia repair 12/17/2020 Dr. Darnell Level  FEN: IV fluid bolus.  Nasogastric tube decompression.  Small bowel protocol.  Hopefully will resolve nonoperatively.  If not improved by 48-72 hours or clinically worsens, consider laparoscopic possible open lysis of adhesions.  Transition zone appears to be more in the deeper abdomen and not at the site of primary hernia repair at the umbilicus or abdominal wall no evidence of any perforation or ischemia.  Patient not in shock.  No peritonitis.  ID: No strong need for antibiotics DVT: SCDs.  Okay to anticoagulate as needed.  Enoxaparin  Hx bacteremia - 12/23/2020 Hypertension Type 2 diabetes hyperglycemic.  Defer to primary medicine service. obstructive sleep apnea -CPAP Asthma/Hx acute respiratory failure 12/13/2020 Hx diastolic congestive heart failure  CKD stage III Wheelchair-bound Obesity with weight loss BMI 38.4>> 34.7 Hx PE on Eliquis GERD    Ardeth Sportsman, MD, FACS, MASCRS Esophageal, Gastrointestinal & Colorectal Surgery Robotic and Minimally Invasive Surgery  [facility], [facility]  [facility]  [address] [place], [place] [address] [phone] Fax [phone] Main  CONTACT  INFORMATION:  Weekday (9AM-5PM): Call [facility] main office at [phone]  Weeknight (5PM-9AM) or Weekend/Holiday: Check [email or web address] (password " [email or web address]") for General Surgery [facility] coverage  (Please, do not use SecureChat as it is not reliable communication to operating surgeons for immediate patient care)

## 2021-05-01 NOTE — ED Provider Notes (Signed)
[facility] EMERGENCY DEPARTMENT Provider Note   CSN: [id number] Arrival date & time: 05/01/21  1034     History Chief Complaint  Patient presents with   Emesis    Randy Liu is a 70 y.o. male.  70 year old male with history of T2DM on insulin, diastolic CHF, PE on anticoagulation, GERD, IBS presenting with diarrhea and vomiting.  Patient reports diarrhea and vomiting started last night.  Initially had diarrhea with 3 episodes, then started having vomiting and has had greater than 10 episodes since onset last night.  Reports having some abdominal discomfort but only when vomiting.  He tried to drink some water and ginger ale this morning but was not able to hold this down.  He has a history of cholangitis s/p cholecystectomy in May of this year.  The history is provided by the patient. No language interpreter was used.  Emesis Associated symptoms: abdominal pain and diarrhea   Associated symptoms: no cough and no fever       Past Medical History:  Diagnosis Date   Asthma    CHF (congestive heart failure) (HCC)    Diabetes mellitus without complication (HCC)    Hypertension     Patient Active Problem List   Diagnosis Date Noted   Small bowel obstruction (HCC) 05/01/2021   Yeast infection 12/15/2020   Elevated LFTs 12/15/2020   Acute hypoxemic respiratory failure (HCC) 12/13/2020   Hyponatremia 12/13/2020   Asthma 12/13/2020   Diabetes (HCC) 12/13/2020   Acute cholangitis 12/12/2020    Past Surgical History:  Procedure Laterality Date   BIOPSY  12/14/2020   Procedure: BIOPSY;  Surgeon: Lynann BolognaGupta, Rajesh, MD;  Location: [facility];  Service: Endoscopy;;   CHOLECYSTECTOMY N/A 12/17/2020   Procedure: LAPAROSCOPIC CHOLECYSTECTOMY, LIVER BIOPSY, PRIMARY UMBILICAL HERNIA REPAIR;  Surgeon: Darnell LevelGerkin, Todd, MD;  Location: [facility];  Service: General;  Laterality: N/A;   ENDOSCOPIC RETROGRADE CHOLANGIOPANCREATOGRAPHY (ERCP) WITH PROPOFOL N/A 12/14/2020   Procedure:  ENDOSCOPIC RETROGRADE CHOLANGIOPANCREATOGRAPHY (ERCP) WITH PROPOFOL;  Surgeon: Lynann BolognaGupta, Rajesh, MD;  Location: [facility];  Service: Endoscopy;  Laterality: N/A;   REMOVAL OF STONES  12/14/2020   Procedure: REMOVAL OF STONES;  Surgeon: Lynann BolognaGupta, Rajesh, MD;  Location: [facility];  Service: Endoscopy;;   SPHINCTEROTOMY  12/14/2020   Procedure: Dennison MascotSPHINCTEROTOMY;  Surgeon: Lynann BolognaGupta, Rajesh, MD;  Location: [facility];  Service: Endoscopy;;       History reviewed. No pertinent family history.  Social History   Tobacco Use   Smoking status: Former   Smokeless tobacco: Never  Substance Use Topics   Alcohol use: Not Currently   Drug use: Never    Home Medications Prior to Admission medications   Medication Sig Start Date End Date Taking? Authorizing Provider  amLODipine (NORVASC) 10 MG tablet Take 1 tablet (10 mg total) by mouth daily. 12/23/20   Azucena FallenLancaster, William C, MD  benzonatate (TESSALON) 200 MG capsule Take 200 mg by mouth every 8 (eight) hours as needed for cough. 11/18/20   [provider]  carvedilol (COREG) 12.5 MG tablet Take 1 tablet (12.5 mg total) by mouth 2 (two) times daily with a meal. 12/23/20   Azucena FallenLancaster, William C, MD  Cholecalciferol 25 MCG (1000 UT) capsule Take 2,000 Units by mouth in the morning and at bedtime.    [provider]  colchicine-probenecid 0.5-500 MG tablet Take 1 tablet by mouth daily. 11/21/20   [provider]  cyanocobalamin (,VITAMIN B-12,) 1000 MCG/ML injection Inject 1,000 mcg into the skin every 30 (thirty) days. 27th  of month 09/23/20   [provider]  ELIQUIS 5 MG TABS tablet Take 5 mg by mouth 2 (two) times daily. 10/21/20   [provider]  fenofibrate 160 MG tablet Take 160 mg by mouth daily. 12/04/20   [provider]  hydrALAZINE (APRESOLINE) 25 MG tablet Take 1 tablet (25 mg total) by mouth every 6 (six) hours. 12/23/20   Azucena Fallen, MD  lansoprazole (PREVACID) 30 MG capsule Take 30 mg by  mouth in the morning and at bedtime. 12/02/20   [provider]  latanoprost (XALATAN) 0.005 % ophthalmic solution Place 1 drop into the left eye at bedtime. 10/29/20   [provider]  magnesium oxide (MAG-OX) 400 MG tablet Take 400 mg by mouth daily. 10/09/20   [provider]  meclizine (ANTIVERT) 25 MG tablet Take 25 mg by mouth 3 (three) times daily as needed for nausea.    [provider]  metoCLOPramide (REGLAN) 5 MG tablet Take 5 mg by mouth at bedtime. 10/09/20   [provider]  montelukast (SINGULAIR) 10 MG tablet Take 10 mg by mouth at bedtime. 09/28/20   [provider]  NOVOLOG MIX 70/30 FLEXPEN (70-30) 100 UNIT/ML FlexPen Inject 0.4 mLs (40 Units total) into the skin 2 (two) times daily with a meal. 81 units in the AM and 68 units in the evening Patient taking differently: Inject 40-81 Units into the skin See admin instructions. 81 units in the 0900 40 units at 1200 40 units at 1700 and 68 units at 2100 12/23/20   Azucena Fallen, MD  ondansetron (ZOFRAN) 8 MG tablet Take 8 mg by mouth every 8 (eight) hours as needed for nausea or vomiting.    [provider]  Probiotic Product (ALIGN) 4 MG CAPS Take 4 mg by mouth daily.    [provider]    Allergies    Metoclopramide, Nsaids, Amoxicillin-pot clavulanate, Statins, Allopurinol, Empagliflozin, and Tiotropium  Review of Systems   Review of Systems  Constitutional:  Negative for fever.  HENT:  Negative for congestion and rhinorrhea.   Respiratory:  Negative for cough and shortness of breath.   Cardiovascular:  Negative for chest pain.  Gastrointestinal:  Positive for abdominal pain, diarrhea, nausea and vomiting. Negative for constipation.   Physical Exam Updated Vital Signs BP (!) 148/75 (BP Location: Right Arm)   Pulse 82   Temp 99.2 F (37.3 C) (Rectal)   Resp 18   Ht 6\' 1"  (1.854 m)   Wt 119.3 kg   SpO2 94%   BMI 34.70 kg/m   Physical  Exam Vitals and nursing note reviewed.  Constitutional:      Appearance: He is well-developed.  HENT:     Head: Normocephalic and atraumatic.     Mouth/Throat:     Mouth: Mucous membranes are moist.     Comments: Tongue appears dry Eyes:     General: No scleral icterus.    Extraocular Movements: Extraocular movements intact.     Conjunctiva/sclera: Conjunctivae normal.  Cardiovascular:     Rate and Rhythm: Normal rate and regular rhythm.     Heart sounds: Normal heart sounds. No murmur heard. Pulmonary:     Effort: Pulmonary effort is normal. No respiratory distress.     Breath sounds: Normal breath sounds.  Abdominal:     General: Bowel sounds are normal.     Palpations: Abdomen is soft.     Tenderness: There is no abdominal tenderness.  Musculoskeletal:  Cervical back: Neck supple.  Skin:    General: Skin is warm and dry.     Coloration: Skin is not jaundiced.  Neurological:     Mental Status: He is alert.    ED Results / Procedures / Treatments   Labs (all labs ordered are listed, but only abnormal results are displayed) Labs Reviewed  COMPREHENSIVE METABOLIC PANEL - Abnormal; Notable for the following components:      Result Value   Glucose, Bld 250 (*)    BUN 26 (*)    Creatinine, Ser 1.34 (*)    GFR, Estimated 56 (*)    All other components within normal limits  RESP PANEL BY RT-PCR (FLU A&B, COVID) ARPGX2  CBC WITH DIFFERENTIAL/PLATELET  LIPASE, BLOOD    EKG None  Radiology CT ABDOMEN PELVIS W CONTRAST  Result Date: 05/01/2021 CLINICAL DATA:  Abdominal pain EXAM: CT ABDOMEN AND PELVIS WITH CONTRAST TECHNIQUE: Multidetector CT imaging of the abdomen and pelvis was performed using the standard protocol following bolus administration of intravenous contrast. CONTRAST:  OMNIPAQUE IOHEXOL 300 MG/ML  SOLN COMPARISON:  CT abdomen pelvis dated December 12, 2020 FINDINGS: Lower chest: Partially visualized small pericardial effusion which is similar to prior  CT. Hepatobiliary: No focal liver abnormality is seen. Status post cholecystectomy. No biliary dilatation. Pancreas: Unremarkable. No pancreatic ductal dilatation or surrounding inflammatory changes. Spleen: Normal in size without focal abnormality. Adrenals/Urinary Tract: Normal bilateral adrenal glands. Bilateral exophytic renal cysts. No hydronephrosis. Bladder is unremarkable. Stomach/Bowel: Stomach is unremarkable. Multiple dilated loops of small bowel with gradual transition point in lower mid abdomen seen on series 3, image 176. And decompressed loops of distal small bowel. No evidence of pneumatosis, free air, free fluid or bowel wall hypoenhancement. Vascular/Lymphatic: Aortic atherosclerosis. No enlarged abdominal or pelvic lymph nodes. Reproductive: Prostate is unremarkable. Other: Small fat containing left inguinal hernia. No abdominopelvic ascites. Musculoskeletal: No acute or significant osseous findings. IMPRESSION: Multiple dilated loops of small bowel with gradual transition point in the mid lower abdomen and decompressed loops of distal small bowel, findings are compatible small bowel obstruction. No evidence of pneumatosis, free air, free fluid or bowel wall hypoenhancement. Electronically Signed   By: Allegra Lai M.D.   On: 05/01/2021 14:15   DG Abd Portable 1V  Result Date: 05/01/2021 CLINICAL DATA:  Encounter for nasogastric tube placement. EXAM: PORTABLE ABDOMEN - 1 VIEW COMPARISON:  None. FINDINGS: Two consecutive radiographs were obtained with the final radiograph demonstrating an enteric tube coursing below the hemidiaphragm with tip and side port overlying the expected region of the gastric lumen. The bowel gas pattern is normal. No radio-opaque calculi or other significant radiographic abnormality are seen. IMPRESSION: Enteric tube noted coursing below the hemidiaphragm with tip and side port overlying the expected region of the gastric lumen. Electronically Signed   By: Tish Frederickson M.D.   On: 05/01/2021 15:42    Procedures Procedures   Medications Ordered in ED Medications  sodium chloride 0.9 % bolus 500 mL (0 mLs Intravenous Stopped 05/01/21 1249)  ondansetron (ZOFRAN) injection 4 mg (4 mg Intravenous Given 05/01/21 1202)  lactated ringers bolus 500 mL (0 mLs Intravenous Stopped 05/01/21 1336)  iohexol (OMNIPAQUE) 300 MG/ML solution 100 mL (100 mLs Intravenous Contrast Given 05/01/21 1313)  ondansetron (ZOFRAN) injection 4 mg (4 mg Intravenous Given 05/01/21 1345)    ED Course  I have reviewed the triage vital signs and the nursing notes.  Pertinent labs & imaging results that were available during my  care of the patient were reviewed by me and considered in my medical decision making (see chart for details).    MDM Rules/Calculators/A&P                         70 year old male with history outlined above presenting with vomiting and diarrhea starting last night as well as abdominal pain.  Reports abdominal pain only with vomiting, no abdominal pain currently.  Differential includes but is not limited to viral gastroenteritis, obstructive process, pancreatitis.  Will order CT abdomen/pelvis, IV fluids, and Zofran.  We will also obtain labs.  CBC, CMP, and lipase overall unremarkable other than mildly elevated creatinine 1.34 which appears to be close to his baseline.  CT abdomen pelvis with evidence of SBO.  He had another episode of vomiting after returning from CT so another IV Zofran was ordered.  Given persistent vomiting, will go ahead and place NG tube.  NG tube placed successfully, confirmed by KUB.  Case was discussed with hospitalist who agrees to accept patient for admission.  Final Clinical Impression(s) / ED Diagnoses Final diagnoses:  Encounter for imaging study to confirm nasogastric (NG) tube placement    Rx / DC Orders ED Discharge Orders     None        Littie Deeds, MD 05/01/21 1559    Alvira Monday, MD 05/02/21 [phone]

## 2021-05-01 NOTE — ED Triage Notes (Signed)
[facility] reports pt coming from home c/o n/v/d that started last night. Pt states he has not eaten since Tuesday.

## 2021-05-01 NOTE — H&P (Signed)
HPI  Randy Liu BHA:[id number] DOB: 1951-07-16 DOA: 05/01/2021  PCP: Felton Clinton, MD   Chief Complaint: Nausea vomiting  HPI:  70 y/o nonambulatory white male since his ERCP sepsis admission usually gets around in a chair--  DM ty ii with gastroparesis/neuropathy in the setting of IBS-does not follow with GI, CKD iii, htn, PE on [facility], OSA/Restrictive disease as well on CPAP, IBS, thyroid nodules, HFpEF Cholangitis--ERCP--cholecystectomy done 12/12/20 admission from [facility]  Patient has history of prior SBO in August 2020 per wife at [facility] was diagnosed with PE and DVT at that time and is on lifelong anticoagulation-when he was discharged from the hospital at that time he went to a skilled facility and then redeveloped SBO subsequently  Developed overnight 8/17 several episodes of nausea after a large stool-chronically takes Reglan for gastroparesis intermittently takes Imodium last dose was 8/11  Persistent vomiting ensued which became bilious every 2 hours since 12 AM 8/18 Presented to [facility] ED and found to have CT abdomen showing SBO-he also had further vomiting diarrhea and NG tube was placed-  Review of Systems:  As below  Pertinent +'s: + Vomiting + nausea + abdominal discomfort Pertinent -"s: - Unilateral weakness #chest pain - arm pain - strokelike symptoms - epilepsy - seizure - blurred vision - double vision - cough - cold  ED Course: IV fluids started bolus given General surgery consulted who saw the patient placed him on Gastrografin protocol in addition to suppository   Past Medical History:  Diagnosis Date   Asthma    CHF (congestive heart failure) (HCC)    Diabetes mellitus without complication (HCC)    Hypertension    Past Surgical History:  Procedure Laterality Date   BIOPSY  12/14/2020   Procedure: BIOPSY;  Surgeon: Lynann Bologna, MD;  Location: [facility];  Service: Endoscopy;;   CHOLECYSTECTOMY N/A 12/17/2020    Procedure: LAPAROSCOPIC CHOLECYSTECTOMY, LIVER BIOPSY, PRIMARY UMBILICAL HERNIA REPAIR;  Surgeon: Darnell Level, MD;  Location: [facility];  Service: General;  Laterality: N/A;   ENDOSCOPIC RETROGRADE CHOLANGIOPANCREATOGRAPHY (ERCP) WITH PROPOFOL N/A 12/14/2020   Procedure: ENDOSCOPIC RETROGRADE CHOLANGIOPANCREATOGRAPHY (ERCP) WITH PROPOFOL;  Surgeon: Lynann Bologna, MD;  Location: [facility];  Service: Endoscopy;  Laterality: N/A;   REMOVAL OF STONES  12/14/2020   Procedure: REMOVAL OF STONES;  Surgeon: Lynann Bologna, MD;  Location: [facility];  Service: Endoscopy;;   SPHINCTEROTOMY  12/14/2020   Procedure: Dennison Mascot;  Surgeon: Lynann Bologna, MD;  Location: [facility];  Service: Endoscopy;;    reports that he has quit smoking. He has never used smokeless tobacco. He reports that he does not currently use alcohol. He reports that he does not use drugs.  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   Allergies  Allergen Reactions   Metoclopramide Itching    Loss of Balance; Urinary Incontinence   Nsaids     Other reaction(s): Other (See Comments) Renal Insufficiency Kidney issues    Amoxicillin-Pot Clavulanate Diarrhea and Nausea And Vomiting    Other reaction(s): Other (See Comments) Abdomen Pain Abdomen pain    Statins     Other reaction(s): Other (See Comments) Myalgias and Myositis myalgias    Allopurinol Rash   Empagliflozin     Other reaction(s):  Other (See Comments) Fainting, weakness, lightheaded, falling   Tiotropium     Other reaction(s): Other (See Comments) Urinary retention   History reviewed. No pertinent family history. Prior to Admission medications   Medication Sig Start Date End Date Taking? Authorizing  Provider  amLODipine (NORVASC) 10 MG tablet Take 1 tablet (10 mg total) by mouth daily. 12/23/20   Azucena Fallen, MD  benzonatate (TESSALON) 200 MG capsule Take 200 mg by mouth every 8 (eight) hours as needed for cough. 11/18/20   [provider]  carvedilol (COREG) 12.5 MG tablet Take 1 tablet (12.5 mg total) by mouth 2 (two) times daily with a meal. 12/23/20   Azucena Fallen, MD  Cholecalciferol 25 MCG (1000 UT) capsule Take 2,000 Units by mouth in the morning and at bedtime.    [provider]  colchicine-probenecid 0.5-500 MG tablet Take 1 tablet by mouth daily. 11/21/20   [provider]  cyanocobalamin (,VITAMIN B-12,) 1000 MCG/ML injection Inject 1,000 mcg into the skin every 30 (thirty) days. 27th of month 09/23/20   [provider]  ELIQUIS 5 MG TABS tablet Take 5 mg by mouth 2 (two) times daily. 10/21/20   [provider]  fenofibrate 160 MG tablet Take 160 mg by mouth daily. 12/04/20   [provider]  hydrALAZINE (APRESOLINE) 25 MG tablet Take 1 tablet (25 mg total) by mouth every 6 (six) hours. 12/23/20   Azucena Fallen, MD  lansoprazole (PREVACID) 30 MG capsule Take 30 mg by mouth in the morning and at bedtime. 12/02/20   [provider]  latanoprost (XALATAN) 0.005 % ophthalmic solution Place 1 drop into the left eye at bedtime. 10/29/20   [provider]  magnesium oxide (MAG-OX) 400 MG tablet Take 400 mg by mouth daily. 10/09/20   [provider]  meclizine (ANTIVERT) 25 MG tablet Take 25 mg by mouth 3 (three) times daily as needed for nausea.    [provider]  metoCLOPramide (REGLAN) 5 MG tablet Take 5 mg by mouth at bedtime. 10/09/20   [provider]  montelukast (SINGULAIR) 10 MG tablet Take 10 mg by mouth at bedtime. 09/28/20   [provider]  NOVOLOG MIX 70/30 FLEXPEN (70-30) 100 UNIT/ML FlexPen Inject 0.4 mLs (40 Units total) into the skin 2 (two) times daily with  a meal. 81 units in the AM and 68 units in the evening Patient taking differently: Inject 40-81 Units into the skin See admin instructions. 81 units in the 0900 40 units at 1200 40 units at 1700 and 68 units at 2100 12/23/20   Azucena Fallen, MD  ondansetron (ZOFRAN) 8 MG tablet Take 8 mg by mouth every 8 (eight) hours as needed for nausea or vomiting.    [provider]  Probiotic Product (ALIGN) 4 MG CAPS Take 4 mg by mouth daily.    [provider]    Physical Exam:  Vitals:   05/01/21 1500 05/01/21 1657  BP: (!) 148/75 (!) 143/82  Pulse: 82 78  Resp: 18 18  Temp:  97.8 F (36.6 C)  SpO2: 94% 95%    Awake alert coherent no distress EOMI NCAT no focal deficit no icterus no pallor Thick neck Mallampati 4 S1-S2 no murmur appreciated Abdomen significantly distended nontender cannot appreciate hepatosplenomegaly Trace lower extremity edema ROM intact upper extremities but significant weakness to hip and knee musculature unable to straight leg raise Reflexes equivocal bilaterally sensory cursorily checked but overall seems equal lower extremities  Psych euthymic pleasant coherent  I have personally reviewed following labs and imaging studies  Labs:  Potassium 4.3 BUNs/creatinine 26/1.3 up from baseline 22/1.2 LFTs normal WBC 7.7 hemoglobin 14.3 platelet 257   Imaging studies:  IMPRESSION: Multiple dilated loops of small bowel with gradual transition point in the mid lower abdomen and decompressed loops of distal small bowel, findings are compatible small bowel obstruction. No evidence of pneumatosis, free air, free fluid or bowel wall hypoenhancement.   Medical tests:  EKG independently reviewed: No EKG performed and none indicated  Test discussed with performing physician: No  Decision to obtain old records:  Yes  Review and summation of old records:  Yes  Active Problems:   Small bowel obstruction (HCC)   Assessment/Plan Small bowel  obstruction in the setting of prior small bowel obstructions 2020 and recent surgery 11/2020 NG tube, drip and suck, diet per surgery-appears Gastrografin in process hopeful to avoid surgery HFpEF Hold Coreg 12.5 twice daily placed on metoprolol IV 5 mg every 6 hourly heart rate >100 May need to add IV hydralazine in addition as amlodipine on hold also Prior provoked DVT/PE August 2020 in the setting of surgery Transition to heparin when washes out of system probably 8/18 Reflux IBS and diabetic gastroparesis Replace Prevacid with pantoprazole IV 40 twice daily Reglan is contraindicated in the setting of SBO-we will hold at this time 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 Inpatient status is judged to be reasonable and necessary in order to provide the required intensity of service to ensure the patient's safety. The patient's presenting symptoms, physical exam findings, and initial radiographic and laboratory data in the context of their chronic comorbidities is felt to place them at high risk for further clinical deterioration. Furthermore, it is not anticipated that the patient will be medically stable for discharge from the hospital within 2 midnights of admission. The following factors support the patient status of inpatient.   " The patient's presenting symptoms include nausea vomiting and abdominal pain. " The worrisome physical exam findings include abdominal distention. " The  initial radiographic and laboratory data are worrisome because of SBO. " The chronic co-morbidities include morbid obesity DM TY 2 nephropathy PE.   * I certify that at the point of admission it is my clinical judgment that the patient will require inpatient hospital care spanning beyond 2 midnights from the point of admission due to high intensity of service, high risk for further deterioration and high frequency of surveillance required.*   DVT prophylaxis: Heparin when Eliquis washes out Code Status: Full Family Communication: Discussed with wife [name] at bedside in great detail Consults called: General surgery  Time spent: 45 minutes  Mahala Menghini, MD Cordelia Poche my NP partners at night for Care related issues] [facility] --[employer] [employer] [place] , [email or web address]; password [facility]  05/01/2021, 5:22 PM

## 2021-05-01 NOTE — Progress Notes (Signed)
ANTICOAGULATION CONSULT NOTE - Initial Consult  Pharmacy Consult for heparin  Indication: pulmonary embolus - bridge therapy while apixaban on hold  Allergies  Allergen Reactions   Metoclopramide Itching    Loss of Balance; Urinary Incontinence   Nsaids     Other reaction(s): Other (See Comments) Renal Insufficiency Kidney issues    Amoxicillin-Pot Clavulanate Diarrhea and Nausea And Vomiting    Other reaction(s): Other (See Comments) Abdomen Pain Abdomen pain    Statins     Other reaction(s): Other (See Comments) Myalgias and Myositis myalgias    Allopurinol Rash   Empagliflozin     Other reaction(s): Other (See Comments) Fainting, weakness, lightheaded, falling   Tiotropium     Other reaction(s): Other (See Comments) Urinary retention    Patient Measurements: Height: 6\' 1"  (185.4 cm) Weight: 119.3 kg (263 lb) IBW/kg (Calculated) : 79.9 Heparin Dosing Weight: 105.7  Vital Signs: Temp: 97.8 F (36.6 C) (08/18 1657) Temp Source: Oral (08/18 1657) BP: 143/82 (08/18 1657) Pulse Rate: 78 (08/18 1657)  Labs: Recent Labs    05/01/21 1150  HGB 14.3  HCT 43.0  PLT 257  CREATININE 1.34*    Estimated Creatinine Clearance: 67.5 mL/min (A) (by C-G formula based on SCr of 1.34 mg/dL (H)).   Medical History: Past Medical History:  Diagnosis Date   Asthma    CHF (congestive heart failure) (HCC)    Diabetes mellitus without complication (HCC)    Hypertension     Medications:  Medications Prior to Admission  Medication Sig Dispense Refill Last Dose   Alpha-Lipoic Acid 300 MG TABS Take 600 mg by mouth daily.   04/30/2021   amLODipine (NORVASC) 10 MG tablet Take 1 tablet (10 mg total) by mouth daily. 30 tablet 0 04/30/2021   benzonatate (TESSALON) 200 MG capsule Take 200 mg by mouth every 8 (eight) hours as needed for cough.   unknown   carvedilol (COREG) 25 MG tablet Take 12.5 mg by mouth 2 (two) times daily.   04/30/2021   Cholecalciferol 25 MCG (1000 UT)  capsule Take 4,000 Units by mouth in the morning and at bedtime.   04/30/2021   colchicine-probenecid 0.5-500 MG tablet Take 1 tablet by mouth daily.   04/30/2021   cyanocobalamin (,VITAMIN B-12,) 1000 MCG/ML injection Inject 1,000 mcg into the skin every 30 (thirty) days. 27th of month   04/09/2021   ELIQUIS 5 MG TABS tablet Take 5 mg by mouth 2 (two) times daily.   04/30/2021 at 2100   fenofibrate 160 MG tablet Take 160 mg by mouth daily.   04/30/2021   lansoprazole (PREVACID) 30 MG capsule Take 30 mg by mouth in the morning and at bedtime.   04/30/2021   latanoprost (XALATAN) 0.005 % ophthalmic solution Place 1 drop into the left eye at bedtime.   04/30/2021   magnesium oxide (MAG-OX) 400 MG tablet Take 400 mg by mouth 2 (two) times daily.   04/30/2021   meclizine (ANTIVERT) 25 MG tablet Take 25 mg by mouth 3 (three) times daily as needed for nausea.   unknown   metoCLOPramide (REGLAN) 5 MG tablet Take 2.5 mg by mouth at bedtime. Take 1/2 tablet (2.5 mg) at bedtime   Past Week   montelukast (SINGULAIR) 10 MG tablet Take 10 mg by mouth at bedtime.   04/30/2021   NOVOLOG MIX 70/30 FLEXPEN (70-30) 100 UNIT/ML FlexPen Inject 0.4 mLs (40 Units total) into the skin 2 (two) times daily with a meal. 81 units in the AM and  68 units in the evening (Patient taking differently: Inject 60 Units into the skin in the morning and at bedtime.) 15 mL 11 04/29/2021   ondansetron (ZOFRAN) 8 MG tablet Take 8 mg by mouth every 8 (eight) hours as needed for nausea or vomiting.   04/30/2021   PROAIR RESPICLICK 108 (90 Base) MCG/ACT AEPB Inhale 2 puffs into the lungs every 6 (six) hours as needed (sob/wheezing).   unknown   Probiotic Product (ALIGN) 4 MG CAPS Take 4 mg by mouth daily.   04/30/2021   carvedilol (COREG) 12.5 MG tablet Take 1 tablet (12.5 mg total) by mouth 2 (two) times daily with a meal. (Patient not taking: No sig reported) 60 tablet 0 Not Taking at 2100   hydrALAZINE (APRESOLINE) 25 MG tablet Take 1 tablet (25 mg  total) by mouth every 6 (six) hours. (Patient not taking: Reported on 05/01/2021) 120 tablet 0 Not Taking    Assessment: 70 yo M on apixaban PTA for hx PE & DVT.  Pharmacy consulted to dose heparin for bridge therapy while apixaban on hold for SBO PTA apixban 5 mg po bid> last dose 8/17 at 2100 CBC WNL, SCr 1.34  Goal of Therapy:  Heparin level 0.3-0.7 units/ml aPTT 66-102 seconds Monitor platelets by anticoagulation protocol: Yes   Plan:  Draw baseline aPTT & heparin level now After baseline labs drawn, start heparin at 1700 units/hr and chek 8 hr aPTT/Heparin level Daily CBC, aPTT & heparin level Will adjust heparin based on aPTT until apixaban cleared from system  Herby Abraham, Pharm.D [phone] 05/01/2021 7:01 PM

## 2021-05-02 ENCOUNTER — Inpatient Hospital Stay (HOSPITAL_COMMUNITY): Payer: Medicare Other

## 2021-05-02 LAB — HEPARIN LEVEL (UNFRACTIONATED): Heparin Unfractionated: >1.1 IU/mL — ABNORMAL HIGH (ref 0.30–0.70)

## 2021-05-02 LAB — COMPREHENSIVE METABOLIC PANEL
ALT: 29 U/L (ref 0–44)
AST: 26 U/L (ref 15–41)
Albumin: 3.5 g/dL (ref 3.5–5.0)
Alkaline Phosphatase: 36 U/L — ABNORMAL LOW (ref 38–126)
Anion gap: 11 (ref 5–15)
BUN: 22 mg/dL (ref 8–23)
CO2: 28 mmol/L (ref 22–32)
Calcium: 9.8 mg/dL (ref 8.9–10.3)
Chloride: 101 mmol/L (ref 98–111)
Creatinine, Ser: 1.43 mg/dL — ABNORMAL HIGH (ref 0.61–1.24)
GFR, Estimated: 52 mL/min — ABNORMAL LOW (ref >60)
Glucose, Bld: 196 mg/dL — ABNORMAL HIGH (ref 70–99)
Potassium: 4 mmol/L (ref 3.5–5.1)
Sodium: 140 mmol/L (ref 135–145)
Total Bilirubin: 0.8 mg/dL (ref 0.3–1.2)
Total Protein: 6.8 g/dL (ref 6.5–8.1)

## 2021-05-02 LAB — CBC
HCT: 41.1 % (ref 39.0–52.0)
Hemoglobin: 13.1 g/dL (ref 13.0–17.0)
MCH: 31.1 pg (ref 26.0–34.0)
MCHC: 31.9 g/dL (ref 30.0–36.0)
MCV: 97.6 fL (ref 80.0–100.0)
Platelets: 231 10*3/uL (ref 150–400)
RBC: 4.21 MIL/uL — ABNORMAL LOW (ref 4.22–5.81)
RDW: 14.8 % (ref 11.5–15.5)
WBC: 7.9 10*3/uL (ref 4.0–10.5)
nRBC: 0 % (ref 0.0–0.2)

## 2021-05-02 LAB — APTT
aPTT: 89 seconds — ABNORMAL HIGH (ref 24–36)
aPTT: 88 seconds — ABNORMAL HIGH (ref 24–36)

## 2021-05-02 LAB — HEMOGLOBIN A1C
Hgb A1c MFr Bld: 7.9 % — ABNORMAL HIGH (ref 4.8–5.6)
Mean Plasma Glucose: 180.03 mg/dL

## 2021-05-02 LAB — GLUCOSE, CAPILLARY
Glucose-Capillary: 159 mg/dL — ABNORMAL HIGH (ref 70–99)
Glucose-Capillary: 174 mg/dL — ABNORMAL HIGH (ref 70–99)
Glucose-Capillary: 180 mg/dL — ABNORMAL HIGH (ref 70–99)
Glucose-Capillary: 189 mg/dL — ABNORMAL HIGH (ref 70–99)
Glucose-Capillary: 201 mg/dL — ABNORMAL HIGH (ref 70–99)
Glucose-Capillary: 218 mg/dL — ABNORMAL HIGH (ref 70–99)

## 2021-05-02 MED ORDER — DEXTROSE 5 % IV SOLN: INTRAVENOUS | Status: DC

## 2021-05-02 NOTE — Progress Notes (Signed)
PROGRESS NOTE   Randy Liu  RWE:[id number] DOB: 02/16/1951 DOA: 05/01/2021 PCP: Felton Clinton, MD  Brief Narrative:  70 year old obese white male BMI 21 Nonambulatory since 12/01/2020 gets around in wheelchair DM TY 2 gastroparesis IBS CKD 3 PE on anticoagulation since 2020 (see HPI) Thyroid nodules OSA/restrictive lung disease on CPAP  Admitted with small bowel obstruction  Hospital-Problem based course  Small bowel obstruction in the setting of prior small bowel obstructions 2020 and recent surgery 11/2020 NG tube, drip and suck, diet per surgery-s/p gastrograffin 8.18 Status post multiple x-rays still has some ileus putting on some from NG Defer to general surgery further plan of care Hopeful to avoid surgery HFpEF Hold Coreg 12.5 twice daily placed on metoprolol IV 5 mg every 6 hourly heart rate >100 Prior provoked DVT/PE August 2020 in the setting of surgery heparin gtt for now Reflux IBS and diabetic gastroparesis Replace Prevacid with pantoprazole IV 40 twice daily Reglan is contraindicated 2/2 sbo 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 OSA on CPAP CPAP qhs Gout No signs symptoms of active flare we will hold colchicine probenecid at this time BMI 34.7-obesity class II Outpatient evaluation Rx   DVT prophylaxis:  Code Status:  Family Communication:  Disposition:  Status is: Inpatient  Remains inpatient appropriate because:Persistent severe electrolyte disturbances and Inpatient level of care appropriate due to severity of illness  Dispo: The patient is from: Home              Anticipated d/c is to: Home              Patient currently is not medically stable to d/c.   Difficult to place patient No     Consultants:  Gen surg  Procedures:   Antimicrobials:     Subjective: 2 stools  yesterday passing some flatus no nausea no vomiting NG tube still in place no chest pain  Objective: Vitals:   05/01/21 2035 05/02/21 0100 05/02/21 0423 05/02/21 0907  BP: 139/73 130/69 (!) 143/77 137/81  Pulse: 77 75 73 70  Resp: 18 18 18 19   Temp: (!) 97.5 F (36.4 C) 97.9 F (36.6 C) 97.9 F (36.6 C) 98.5 F (36.9 C)  TempSrc: Oral Oral Oral Oral  SpO2: 93% 95% 96% 94%  Weight:      Height:        Intake/Output Summary (Last 24 hours) at 05/02/2021 1038 Last data filed at 05/02/2021 1000 Gross per 24 hour  Intake 1476.36 ml  Output 450 ml  Net 1026.36 ml   Filed Weights   05/01/21 1038  Weight: 119.3 kg    Examination:  EOMI NCAT thick neck Mallampati 4 S1-S2 no murmur CTA B no rales rhonchi Abdomen distended tympanitic no hepatosplenomegaly no lower extremity edema  Data Reviewed: personally reviewed   CBC    Component Value Date/Time   WBC 7.9 05/02/2021 0450   RBC 4.21 (L) 05/02/2021 0450   HGB 13.1 05/02/2021 0450   HCT 41.1 05/02/2021 0450   PLT 231 05/02/2021 0450   MCV 97.6 05/02/2021 0450   MCH 31.1 05/02/2021 0450   MCHC 31.9 05/02/2021 0450   RDW 14.8 05/02/2021 0450   LYMPHSABS 1.4 05/01/2021 1150   MONOABS 0.5 05/01/2021 1150   EOSABS 0.1 05/01/2021 1150   BASOSABS 0.0 05/01/2021 1150   CMP Latest Ref Rng & Units  05/02/2021 05/01/2021 12/26/2020  Glucose 70 - 99 mg/dL 269(S) 854(O) 270(J)  BUN 8 - 23 mg/dL 22 50(K) 23  Creatinine 0.61 - 1.24 mg/dL 9.38(H) 8.29(H) 3.71  Sodium 135 - 145 mmol/L 140 136 142  Potassium 3.5 - 5.1 mmol/L 4.0 4.3 4.1  Chloride 98 - 111 mmol/L 101 99 107  CO2 22 - 32 mmol/L 28 26 24   Calcium 8.9 - 10.3 mg/dL 9.8 9.5 9.1  Total Protein 6.5 - 8.1 g/dL 6.8 7.1 6.9  Total Bilirubin 0.3 - 1.2 mg/dL 0.8 0.6 1.2  Alkaline Phos 38 - 126 U/L 36(L) 41 72  AST 15 - 41 U/L 26 36 38  ALT 0 - 44 U/L 29 33 31     Radiology Studies: CT ABDOMEN PELVIS W CONTRAST  Result Date: 05/01/2021 CLINICAL DATA:  Abdominal pain  EXAM: CT ABDOMEN AND PELVIS WITH CONTRAST TECHNIQUE: Multidetector CT imaging of the abdomen and pelvis was performed using the standard protocol following bolus administration of intravenous contrast. CONTRAST:  05/03/2021 OMNIPAQUE IOHEXOL 300 MG/ML  SOLN COMPARISON:  CT abdomen pelvis dated December 12, 2020 FINDINGS: Lower chest: Partially visualized small pericardial effusion which is similar to prior CT. Hepatobiliary: No focal liver abnormality is seen. Status post cholecystectomy. No biliary dilatation. Pancreas: Unremarkable. No pancreatic ductal dilatation or surrounding inflammatory changes. Spleen: Normal in size without focal abnormality. Adrenals/Urinary Tract: Normal bilateral adrenal glands. Bilateral exophytic renal cysts. No hydronephrosis. Bladder is unremarkable. Stomach/Bowel: Stomach is unremarkable. Multiple dilated loops of small bowel with gradual transition point in lower mid abdomen seen on series 3, image 176. And decompressed loops of distal small bowel. No evidence of pneumatosis, free air, free fluid or bowel wall hypoenhancement. Vascular/Lymphatic: Aortic atherosclerosis. No enlarged abdominal or pelvic lymph nodes. Reproductive: Prostate is unremarkable. Other: Small fat containing left inguinal hernia. No abdominopelvic ascites. Musculoskeletal: No acute or significant osseous findings. IMPRESSION: Multiple dilated loops of small bowel with gradual transition point in the mid lower abdomen and decompressed loops of distal small bowel, findings are compatible small bowel obstruction. No evidence of pneumatosis, free air, free fluid or bowel wall hypoenhancement. Electronically Signed   By: December 14, 2020 M.D.   On: 05/01/2021 14:15   DG Abd Portable 1V-Small Bowel Obstruction Protocol-initial, 8 hr delay  Result Date: 05/02/2021 CLINICAL DATA:  Small-bowel protocol.  8 hour post contrast film. EXAM: PORTABLE ABDOMEN - 1 VIEW COMPARISON:  Abdominal radiograph dated 05/01/2021 and CT  dated 05/01/2021. FINDINGS: Enteric tube with tip in the left upper abdomen likely in the body of the stomach. Persistent dilatation of air-filled loops of small bowel in the lower abdomen measuring up to approximately 6 cm in caliber. No free air identified. Right upper quadrant cholecystectomy clips. Excreted contrast noted in the bladder. The soft tissues are grossly unremarkable. IMPRESSION: Persistent dilatation of air-filled loops of small bowel in the lower abdomen. Electronically Signed   By: 05/03/2021 M.D.   On: 05/02/2021 02:13   DG Abd Portable 1V  Result Date: 05/01/2021 CLINICAL DATA:  Encounter for nasogastric tube placement. EXAM: PORTABLE ABDOMEN - 1 VIEW COMPARISON:  None. FINDINGS: Two consecutive radiographs were obtained with the final radiograph demonstrating an enteric tube coursing below the hemidiaphragm with tip and side port overlying the expected region of the gastric lumen. The bowel gas pattern is normal. No radio-opaque calculi or other significant radiographic abnormality are seen. IMPRESSION: Enteric tube noted coursing below the hemidiaphragm with tip and side port overlying the expected region of the  gastric lumen. Electronically Signed   By: Tish Frederickson M.D.   On: 05/01/2021 15:42     Scheduled Meds:  bisacodyl  10 mg Rectal Daily   insulin aspart  0-6 Units Subcutaneous Q4H   insulin aspart protamine- aspart  12 Units Subcutaneous BID WC   lip balm  1 application Topical BID   pantoprazole (PROTONIX) IV  40 mg Intravenous Q12H   Continuous Infusions:  heparin 1,700 Units/hr (05/02/21 1030)   lactated ringers     methocarbamol (ROBAXIN) IV     ondansetron (ZOFRAN) IV       LOS: 1 day   Time spent: 70  Rhetta Mura, MD [facility] To contact the attending provider between 7A-7P or the covering provider during after hours 7P-7A, please log into the web site [email or web address] and access using universal [place] password for that web  site. If you do not have the password, please call the hospital operator.  05/02/2021, 10:38 AM

## 2021-05-02 NOTE — Progress Notes (Signed)
Patient up in chair via lift. Patient sat in chair for one hour 30 minutes and then placed back in bed via lift. Patient stable in bed  and call bell within reach.

## 2021-05-02 NOTE — Progress Notes (Signed)
Inpatient Diabetes Program Recommendations  AACE/ADA: New Consensus Statement on Inpatient Glycemic Control (2015)  Target Ranges:  Prepandial:   less than 140 mg/dL      Peak postprandial:   less than 180 mg/dL (1-2 hours)      Critically ill patients:  140 - 180 mg/dL   Lab Results  Component Value Date   GLUCAP 218 (H) 05/02/2021   HGBA1C 7.9 (H) 05/02/2021    Review of Glycemic Control  Diabetes history: DM2 Outpatient Diabetes medications: 70/30 60 units BID Current orders for Inpatient glycemic control: 70/30 12 units BID, Novolog 0-6 units Q4H  Inpatient Diabetes Program Recommendations:    Please consider discontinuing 70/30 if patient remains NPO as there is meal coverage in this insulin.  Might consider,   Semglee 12 units QD (0.1 units x 119.3 kg = 12 units)  Will continue to follow while inpatient.  Thank you, Dulce Sellar, RN, BSN Diabetes Coordinator Inpatient Diabetes Program [phone] (team pager from 8a-5p)

## 2021-05-02 NOTE — Progress Notes (Signed)
Progress Note     Subjective: No complaints this am. Abdominal pain resolved. No nausea or emesis today. 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 He denies respiratory complaints   Objective: Vital signs in last 24 hours: Temp:  [97.5 F (36.4 C)-99.2 F (37.3 C)] 98.5 F (36.9 C) (08/19 0907) Pulse Rate:  [69-83] 70 (08/19 0907) Resp:  [18-19] 19 (08/19 0907) BP: (130-151)/(69-82) 137/81 (08/19 0907) SpO2:  [93 %-99 %] 94 % (08/19 0907) Weight:  [119.3 kg] 119.3 kg (08/18 1038) Last BM Date: 04/30/21  Intake/Output from previous day: 08/18 0701 - 08/19 0700 In: 1476.4 [I.V.:159.9; IV Piggyback:1316.5] Out: 450 [Emesis/NG output:450] Intake/Output this shift: No intake/output data recorded.  PE: General: pleasant, WD, male who is laying in bed in NAD HEENT: head is normocephalic, atraumatic. Mouth is pink and moist Heart: Palpable radial and pedal pulses bilaterally Lungs:  Respiratory effort nonlabored on room air Abd: soft, distension difficult to assess secondary to body habitus. No tenderness to palpation. +BS NG cannister with bilious output MSK: all 4 extremities are symmetrical with no cyanosis, clubbing, or edema. Skin: warm and dry with no masses, lesions, or rashes Psych: A&Ox3 with an appropriate affect.    Lab Results:  Recent Labs    05/01/21 1150 05/02/21 0450  WBC 7.7 7.9  HGB 14.3 13.1  HCT 43.0 41.1  PLT 257 231   BMET Recent Labs    05/01/21 1150 05/02/21 0450  NA 136 140  K 4.3 4.0  CL 99 101  CO2 26 28  GLUCOSE 250* 196*  BUN 26* 22  CREATININE 1.34* 1.43*  CALCIUM 9.5 9.8   PT/INR No results for input(s): LABPROT, INR in the last 72 hours. CMP     Component Value Date/Time   NA 140 05/02/2021 0450   K 4.0 05/02/2021 0450   CL 101 05/02/2021 0450   CO2 28 05/02/2021 0450   GLUCOSE 196 (H) 05/02/2021 0450   BUN 22 05/02/2021 0450   CREATININE 1.43 (H) 05/02/2021 0450   CALCIUM 9.8 05/02/2021 0450   PROT 6.8  05/02/2021 0450   ALBUMIN 3.5 05/02/2021 0450   AST 26 05/02/2021 0450   ALT 29 05/02/2021 0450   ALKPHOS 36 (L) 05/02/2021 0450   BILITOT 0.8 05/02/2021 0450   GFRNONAA 52 (L) 05/02/2021 0450   Lipase     Component Value Date/Time   LIPASE 19 05/01/2021 1150       Studies/Results: CT ABDOMEN PELVIS W CONTRAST  Result Date: 05/01/2021 CLINICAL DATA:  Abdominal pain EXAM: CT ABDOMEN AND PELVIS WITH CONTRAST TECHNIQUE: Multidetector CT imaging of the abdomen and pelvis was performed using the standard protocol following bolus administration of intravenous contrast. CONTRAST:  OMNIPAQUE IOHEXOL 300 MG/ML  SOLN COMPARISON:  CT abdomen pelvis dated December 12, 2020 FINDINGS: Lower chest: Partially visualized small pericardial effusion which is similar to prior CT. Hepatobiliary: No focal liver abnormality is seen. Status post cholecystectomy. No biliary dilatation. Pancreas: Unremarkable. No pancreatic ductal dilatation or surrounding inflammatory changes. Spleen: Normal in size without focal abnormality. Adrenals/Urinary Tract: Normal bilateral adrenal glands. Bilateral exophytic renal cysts. No hydronephrosis. Bladder is unremarkable. Stomach/Bowel: Stomach is unremarkable. Multiple dilated loops of small bowel with gradual transition point in lower mid abdomen seen on series 3, image 176. And decompressed loops of distal small bowel. No evidence of pneumatosis, free air, free fluid or bowel wall hypoenhancement. Vascular/Lymphatic: Aortic atherosclerosis. No enlarged abdominal or pelvic lymph nodes. Reproductive: Prostate is unremarkable. Other: Small  fat containing left inguinal hernia. No abdominopelvic ascites. Musculoskeletal: No acute or significant osseous findings. IMPRESSION: Multiple dilated loops of small bowel with gradual transition point in the mid lower abdomen and decompressed loops of distal small bowel, findings are compatible small bowel obstruction. No evidence of  pneumatosis, free air, free fluid or bowel wall hypoenhancement. Electronically Signed   By: Allegra Lai M.D.   On: 05/01/2021 14:15   DG Abd Portable 1V-Small Bowel Obstruction Protocol-initial, 8 hr delay  Result Date: 05/02/2021 CLINICAL DATA:  Small-bowel protocol.  8 hour post contrast film. EXAM: PORTABLE ABDOMEN - 1 VIEW COMPARISON:  Abdominal radiograph dated 05/01/2021 and CT dated 05/01/2021. FINDINGS: Enteric tube with tip in the left upper abdomen likely in the body of the stomach. Persistent dilatation of air-filled loops of small bowel in the lower abdomen measuring up to approximately 6 cm in caliber. No free air identified. Right upper quadrant cholecystectomy clips. Excreted contrast noted in the bladder. The soft tissues are grossly unremarkable. IMPRESSION: Persistent dilatation of air-filled loops of small bowel in the lower abdomen. Electronically Signed   By: Elgie Collard M.D.   On: 05/02/2021 02:13   DG Abd Portable 1V  Result Date: 05/01/2021 CLINICAL DATA:  Encounter for nasogastric tube placement. EXAM: PORTABLE ABDOMEN - 1 VIEW COMPARISON:  None. FINDINGS: Two consecutive radiographs were obtained with the final radiograph demonstrating an enteric tube coursing below the hemidiaphragm with tip and side port overlying the expected region of the gastric lumen. The bowel gas pattern is normal. No radio-opaque calculi or other significant radiographic abnormality are seen. IMPRESSION: Enteric tube noted coursing below the hemidiaphragm with tip and side port overlying the expected region of the gastric lumen. Electronically Signed   By: Tish Frederickson M.D.   On: 05/01/2021 15:42    Anti-infectives: Anti-infectives (From admission, onward)    None        Assessment/Plan Abdominal pain with nausea, vomiting, and diarrhea SBO S/P laparoscopic cholecystectomy, laparoscopic liver biopsy, primary umbilical  hernia repair 12/17/2020 Dr. Darnell Level  - am xray with  persistent SBO. continue NGT to LIWS today and keep NPO - PT/OT for mobilization  Hopefully will resolve nonoperatively.  If not improved by 48-72 hours or clinically worsens, consider laparoscopic possible open lysis of adhesions.  Transition zone appears to be more in the deeper abdomen and not at the site of primary hernia repair at the umbilicus or abdominal wall no evidence of any perforation or ischemia.  Patient not in shock.  No peritonitis.   FEN: NGT LIWS, IVF ID: No strong need for antibiotics DVT: SCDs. Heparin gtt   Hx bacteremia - 12/23/2020 Hypertension Type 2 diabetes hyperglycemic.  Defer to primary medicine service. obstructive sleep apnea -CPAP Asthma/Hx acute respiratory failure 12/13/2020 Hx diastolic congestive heart failure  CKD stage III Wheelchair-bound Obesity with weight loss BMI 38.4>> 34.7 Hx PE on Eliquis GERD    LOS: 1 day    Eric Form, [facility] Surgery 05/02/2021, 10:30 AM Please see Amion for pager number during day hours 7:00am-4:30pm

## 2021-05-02 NOTE — Progress Notes (Signed)
ANTICOAGULATION CONSULT NOTE  Pharmacy Consult for heparin  Indication: pulmonary embolus - bridge therapy while apixaban on hold  Allergies  Allergen Reactions   Metoclopramide Itching    Loss of Balance; Urinary Incontinence   Nsaids     Other reaction(s): Other (See Comments) Renal Insufficiency Kidney issues    Amoxicillin-Pot Clavulanate Diarrhea and Nausea And Vomiting    Other reaction(s): Other (See Comments) Abdomen Pain Abdomen pain    Statins     Other reaction(s): Other (See Comments) Myalgias and Myositis myalgias    Allopurinol Rash   Empagliflozin     Other reaction(s): Other (See Comments) Fainting, weakness, lightheaded, falling   Tiotropium     Other reaction(s): Other (See Comments) Urinary retention    Patient Measurements: Height: 6\' 1"  (185.4 cm) Weight: 119.3 kg (263 lb) IBW/kg (Calculated) : 79.9 Heparin Dosing Weight: 105.7  Vital Signs: Temp: 97.4 F (36.3 C) (08/19 1342) Temp Source: Oral (08/19 1342) BP: 154/65 (08/19 1342) Pulse Rate: 68 (08/19 1342)  Labs: Recent Labs    05/01/21 1150 05/01/21 1901 05/02/21 0450 05/02/21 1301  HGB 14.3  --  13.1  --   HCT 43.0  --  41.1  --   PLT 257  --  231  --   APTT  --  34 89* 88*  HEPARINUNFRC  --  >1.10* >1.10*  --   CREATININE 1.34*  --  1.43*  --      Estimated Creatinine Clearance: 63.2 mL/min (A) (by C-G formula based on SCr of 1.43 mg/dL (H)).   Medical History: Past Medical History:  Diagnosis Date   Asthma    CHF (congestive heart failure) (HCC)    Diabetes mellitus without complication (HCC)    Hypertension     Medications:  Medications Prior to Admission  Medication Sig Dispense Refill Last Dose   Alpha-Lipoic Acid 300 MG TABS Take 600 mg by mouth daily.   04/30/2021   amLODipine (NORVASC) 10 MG tablet Take 1 tablet (10 mg total) by mouth daily. 30 tablet 0 04/30/2021   benzonatate (TESSALON) 200 MG capsule Take 200 mg by mouth every 8 (eight) hours as needed  for cough.   unknown   carvedilol (COREG) 25 MG tablet Take 12.5 mg by mouth 2 (two) times daily.   04/30/2021   Cholecalciferol 25 MCG (1000 UT) capsule Take 4,000 Units by mouth in the morning and at bedtime.   04/30/2021   colchicine-probenecid 0.5-500 MG tablet Take 1 tablet by mouth daily.   04/30/2021   cyanocobalamin (,VITAMIN B-12,) 1000 MCG/ML injection Inject 1,000 mcg into the skin every 30 (thirty) days. 27th of month   04/09/2021   ELIQUIS 5 MG TABS tablet Take 5 mg by mouth 2 (two) times daily.   04/30/2021 at 2100   fenofibrate 160 MG tablet Take 160 mg by mouth daily.   04/30/2021   lansoprazole (PREVACID) 30 MG capsule Take 30 mg by mouth in the morning and at bedtime.   04/30/2021   latanoprost (XALATAN) 0.005 % ophthalmic solution Place 1 drop into the left eye at bedtime.   04/30/2021   magnesium oxide (MAG-OX) 400 MG tablet Take 400 mg by mouth 2 (two) times daily.   04/30/2021   meclizine (ANTIVERT) 25 MG tablet Take 25 mg by mouth 3 (three) times daily as needed for nausea.   unknown   metoCLOPramide (REGLAN) 5 MG tablet Take 2.5 mg by mouth at bedtime. Take 1/2 tablet (2.5 mg) at bedtime   Past  Week   montelukast (SINGULAIR) 10 MG tablet Take 10 mg by mouth at bedtime.   04/30/2021   NOVOLOG MIX 70/30 FLEXPEN (70-30) 100 UNIT/ML FlexPen Inject 0.4 mLs (40 Units total) into the skin 2 (two) times daily with a meal. 81 units in the AM and 68 units in the evening (Patient taking differently: Inject 60 Units into the skin in the morning and at bedtime.) 15 mL 11 04/29/2021   ondansetron (ZOFRAN) 8 MG tablet Take 8 mg by mouth every 8 (eight) hours as needed for nausea or vomiting.   04/30/2021   PROAIR RESPICLICK 108 (90 Base) MCG/ACT AEPB Inhale 2 puffs into the lungs every 6 (six) hours as needed (sob/wheezing).   unknown   Probiotic Product (ALIGN) 4 MG CAPS Take 4 mg by mouth daily.   04/30/2021   carvedilol (COREG) 12.5 MG tablet Take 1 tablet (12.5 mg total) by mouth 2 (two) times daily  with a meal. (Patient not taking: No sig reported) 60 tablet 0 Not Taking at 2100   hydrALAZINE (APRESOLINE) 25 MG tablet Take 1 tablet (25 mg total) by mouth every 6 (six) hours. (Patient not taking: Reported on 05/01/2021) 120 tablet 0 Not Taking    Assessment: 70 yo M on apixaban PTA for hx PE & DVT.  Pharmacy consulted to dose heparin for bridge therapy while apixaban on hold for SBO PTA apixban 5 mg po bid> last dose 8/17 at 2100.  CBC WNL, SCr 1.34  05/02/2021: aPTT 88 sec - therapeutic on heparin 1700 units/hr Heparin level >1.1 as anticipated due to interaction with apixaban CBC: WNL No overt bleeding or infusion related issues per RN   Goal of Therapy:  Heparin level 0.3-0.7 units/ml aPTT 66-102 seconds Monitor platelets by anticoagulation protocol: Yes   Plan:  Continue heparin at 1700 units/hr  CBC, aPTT & heparin level in AM Will adjust heparin based on aPTT until apixaban cleared from system  Luisa Hart D  05/02/2021 1:47 PM

## 2021-05-02 NOTE — Progress Notes (Signed)
RT went to inquire about cpap. Pt currently has NG tube in place and we will wait on pts wife to bring his home cpap with nasal pillow from home to start. Or once's the NG tube is removed we will place pt on cpap.

## 2021-05-02 NOTE — Progress Notes (Signed)
ANTICOAGULATION CONSULT NOTE  Pharmacy Consult for heparin  Indication: pulmonary embolus - bridge therapy while apixaban on hold  Allergies  Allergen Reactions   Metoclopramide Itching    Loss of Balance; Urinary Incontinence   Nsaids     Other reaction(s): Other (See Comments) Renal Insufficiency Kidney issues    Amoxicillin-Pot Clavulanate Diarrhea and Nausea And Vomiting    Other reaction(s): Other (See Comments) Abdomen Pain Abdomen pain    Statins     Other reaction(s): Other (See Comments) Myalgias and Myositis myalgias    Allopurinol Rash   Empagliflozin     Other reaction(s): Other (See Comments) Fainting, weakness, lightheaded, falling   Tiotropium     Other reaction(s): Other (See Comments) Urinary retention    Patient Measurements: Height: 6\' 1"  (185.4 cm) Weight: 119.3 kg (263 lb) IBW/kg (Calculated) : 79.9 Heparin Dosing Weight: 105.7  Vital Signs: Temp: 97.9 F (36.6 C) (08/19 0423) Temp Source: Oral (08/19 0423) BP: 143/77 (08/19 0423) Pulse Rate: 73 (08/19 0423)  Labs: Recent Labs    05/01/21 1150 05/01/21 1901 05/02/21 0450  HGB 14.3  --  13.1  HCT 43.0  --  41.1  PLT 257  --  231  APTT  --  34 89*  HEPARINUNFRC  --  >1.10* >1.10*  CREATININE 1.34*  --  1.43*     Estimated Creatinine Clearance: 63.2 mL/min (A) (by C-G formula based on SCr of 1.43 mg/dL (H)).   Medical History: Past Medical History:  Diagnosis Date   Asthma    CHF (congestive heart failure) (HCC)    Diabetes mellitus without complication (HCC)    Hypertension     Medications:  Medications Prior to Admission  Medication Sig Dispense Refill Last Dose   Alpha-Lipoic Acid 300 MG TABS Take 600 mg by mouth daily.   04/30/2021   amLODipine (NORVASC) 10 MG tablet Take 1 tablet (10 mg total) by mouth daily. 30 tablet 0 04/30/2021   benzonatate (TESSALON) 200 MG capsule Take 200 mg by mouth every 8 (eight) hours as needed for cough.   unknown   carvedilol (COREG) 25  MG tablet Take 12.5 mg by mouth 2 (two) times daily.   04/30/2021   Cholecalciferol 25 MCG (1000 UT) capsule Take 4,000 Units by mouth in the morning and at bedtime.   04/30/2021   colchicine-probenecid 0.5-500 MG tablet Take 1 tablet by mouth daily.   04/30/2021   cyanocobalamin (,VITAMIN B-12,) 1000 MCG/ML injection Inject 1,000 mcg into the skin every 30 (thirty) days. 27th of month   04/09/2021   ELIQUIS 5 MG TABS tablet Take 5 mg by mouth 2 (two) times daily.   04/30/2021 at 2100   fenofibrate 160 MG tablet Take 160 mg by mouth daily.   04/30/2021   lansoprazole (PREVACID) 30 MG capsule Take 30 mg by mouth in the morning and at bedtime.   04/30/2021   latanoprost (XALATAN) 0.005 % ophthalmic solution Place 1 drop into the left eye at bedtime.   04/30/2021   magnesium oxide (MAG-OX) 400 MG tablet Take 400 mg by mouth 2 (two) times daily.   04/30/2021   meclizine (ANTIVERT) 25 MG tablet Take 25 mg by mouth 3 (three) times daily as needed for nausea.   unknown   metoCLOPramide (REGLAN) 5 MG tablet Take 2.5 mg by mouth at bedtime. Take 1/2 tablet (2.5 mg) at bedtime   Past Week   montelukast (SINGULAIR) 10 MG tablet Take 10 mg by mouth at bedtime.   04/30/2021  NOVOLOG MIX 70/30 FLEXPEN (70-30) 100 UNIT/ML FlexPen Inject 0.4 mLs (40 Units total) into the skin 2 (two) times daily with a meal. 81 units in the AM and 68 units in the evening (Patient taking differently: Inject 60 Units into the skin in the morning and at bedtime.) 15 mL 11 04/29/2021   ondansetron (ZOFRAN) 8 MG tablet Take 8 mg by mouth every 8 (eight) hours as needed for nausea or vomiting.   04/30/2021   PROAIR RESPICLICK 108 (90 Base) MCG/ACT AEPB Inhale 2 puffs into the lungs every 6 (six) hours as needed (sob/wheezing).   unknown   Probiotic Product (ALIGN) 4 MG CAPS Take 4 mg by mouth daily.   04/30/2021   carvedilol (COREG) 12.5 MG tablet Take 1 tablet (12.5 mg total) by mouth 2 (two) times daily with a meal. (Patient not taking: No sig  reported) 60 tablet 0 Not Taking at 2100   hydrALAZINE (APRESOLINE) 25 MG tablet Take 1 tablet (25 mg total) by mouth every 6 (six) hours. (Patient not taking: Reported on 05/01/2021) 120 tablet 0 Not Taking    Assessment: 70 yo M on apixaban PTA for hx PE & DVT.  Pharmacy consulted to dose heparin for bridge therapy while apixaban on hold for SBO PTA apixban 5 mg po bid> last dose 8/17 at 2100.  CBC WNL, SCr 1.34  05/02/2021: aPTT 89 sec - therapeutic on heparin 1700 units/hr Heparin level >1.1 as anticipated due to interaction with apixaban CBC: WNL No overt bleeding or infusion related issues per RN.  Pt continues to have scant, pinkish drainage from chest that is unchanged from baseline.  Goal of Therapy:  Heparin level 0.3-0.7 units/ml aPTT 66-102 seconds Monitor platelets by anticoagulation protocol: Yes   Plan:  Continue heparin at 1700 units/hr  Recheck aPTT in 8hrs Daily CBC, aPTT & heparin level Will adjust heparin based on aPTT until apixaban cleared from system  [employer] [place].D 05/02/2021 6:04 AM

## 2021-05-02 NOTE — TOC Initial Note (Signed)
Transition of Care [facility]) - Initial/Assessment Note    Patient Details  Name: Randy Liu MRN: [id number] Date of Birth: 10-Oct-1950  [facility]) CM/SW Contact:    Lanier Clam, RN Phone Number: 05/02/2021, 10:16 AM  Clinical Narrative: Spoke to spouse [name] about d/c plans-Active w/[facility]-HHPT/OT rep Marylene Land following-resume HHPT/OT orders.PTAR needed @ d/c.                Expected Discharge Plan: Home w Home Health Services Barriers to Discharge: Continued Medical Work up   Patient Goals and CMS Choice Patient states their goals for this hospitalization and ongoing recovery are:: go home CMS Medicare.gov Compare Post Acute Care list provided to:: Patient Represenative (must comment) ([name] spouse [phone]) Choice offered to / list presented to : Spouse  Expected Discharge Plan and Services Expected Discharge Plan: Home w Home Health Services   Discharge Planning Services: CM Consult   Living arrangements for the past 2 months: Single Family Home                             HH Agency: [facility] Date [facility] Agency Contacted: 05/02/21 Time HH Agency Contacted: [phone] Representative spoke with at [facility] Agency: Marylene Land  Prior Living Arrangements/Services Living arrangements for the past 2 months: Single Family Home Lives with:: Spouse Patient language and need for interpreter reviewed:: Yes Do you feel safe going back to the place where you live?: Yes      Need for Family Participation in Patient Care: No (Comment) Care giver support system in place?: Yes (comment) Current home services: DME, Home PT, Home OT (w/c;Active w/[facility] HHPT/OT) Criminal Activity/Legal Involvement Pertinent to Current Situation/Hospitalization: No - Comment as needed  Activities of Daily Living Home Assistive Devices/Equipment: Hospital bed, Wheelchair, Bedside commode/3-in-1, Shower chair with back, Raised toilet seat with rails, Eyeglasses, Hearing aid (lift  chair, bilateral hearing aides) ADL Screening (condition at time of admission) Patient's cognitive ability adequate to safely complete daily activities?: Yes Is the patient deaf or have difficulty hearing?: Yes Does the patient have difficulty seeing, even when wearing glasses/contacts?: No Does the patient have difficulty concentrating, remembering, or making decisions?: No Patient able to express need for assistance with ADLs?: Yes Does the patient have difficulty dressing or bathing?: Yes Independently performs ADLs?: No Communication: Independent Dressing (OT): Needs assistance Is this a change from baseline?: Pre-admission baseline Grooming: Independent Feeding: Independent Bathing: Needs assistance Is this a change from baseline?: Pre-admission baseline Toileting: Needs assistance Is this a change from baseline?: Pre-admission baseline In/Out Bed: Needs assistance Is this a change from baseline?: Pre-admission baseline Walks in Home: Dependent Is this a change from baseline?: Pre-admission baseline Does the patient have difficulty walking or climbing stairs?: Yes Weakness of Legs: Both Weakness of Arms/Hands: None  Permission Sought/Granted Permission sought to share information with : Case Manager Permission granted to share information with : Yes, Verbal Permission Granted  Share Information with NAME: Case manager     Permission granted to share info w Relationship: [name] [phone]     Emotional Assessment Appearance:: Appears stated age Attitude/Demeanor/Rapport: Gracious Affect (typically observed): Accepting Orientation: : Oriented to Self, Oriented to Place, Oriented to  Time, Oriented to Situation Alcohol / Substance Use: Not Applicable Psych Involvement: No (comment)  Admission diagnosis:  Small bowel obstruction (HCC) [K56.609] Encounter for imaging study to confirm nasogastric (NG) tube placement [Z01.89] SBO (small bowel obstruction) (HCC)  [K56.609] Patient  Active Problem List   Diagnosis Date Noted   Small bowel obstruction (HCC) 05/01/2021   SBO (small bowel obstruction) (HCC) 05/01/2021   Yeast infection 12/15/2020   Elevated LFTs 12/15/2020   Acute hypoxemic respiratory failure (HCC) 12/13/2020   Hyponatremia 12/13/2020   Asthma 12/13/2020   Diabetes (HCC) 12/13/2020   Acute cholangitis 12/12/2020   PCP:  Felton Clinton, MD Pharmacy:   [facility] - [place], [place] - [address] [place] [address] Phone: [phone] Fax: [phone]     Social Determinants of Health (SDOH) Interventions    Readmission Risk Interventions No flowsheet data found.

## 2021-05-03 ENCOUNTER — Inpatient Hospital Stay (HOSPITAL_COMMUNITY): Payer: Medicare Other

## 2021-05-03 DIAGNOSIS — K56609 Unspecified intestinal obstruction, unspecified as to partial versus complete obstruction: Secondary | ICD-10-CM

## 2021-05-03 LAB — GLUCOSE, CAPILLARY
Glucose-Capillary: 139 mg/dL — ABNORMAL HIGH (ref 70–99)
Glucose-Capillary: 163 mg/dL — ABNORMAL HIGH (ref 70–99)
Glucose-Capillary: 165 mg/dL — ABNORMAL HIGH (ref 70–99)
Glucose-Capillary: 176 mg/dL — ABNORMAL HIGH (ref 70–99)
Glucose-Capillary: 179 mg/dL — ABNORMAL HIGH (ref 70–99)
Glucose-Capillary: 187 mg/dL — ABNORMAL HIGH (ref 70–99)
Glucose-Capillary: 213 mg/dL — ABNORMAL HIGH (ref 70–99)

## 2021-05-03 LAB — BASIC METABOLIC PANEL
Anion gap: 13 (ref 5–15)
BUN: 24 mg/dL — ABNORMAL HIGH (ref 8–23)
CO2: 25 mmol/L (ref 22–32)
Calcium: 9.4 mg/dL (ref 8.9–10.3)
Chloride: 101 mmol/L (ref 98–111)
Creatinine, Ser: 1.34 mg/dL — ABNORMAL HIGH (ref 0.61–1.24)
GFR, Estimated: 56 mL/min — ABNORMAL LOW (ref >60)
Glucose, Bld: 193 mg/dL — ABNORMAL HIGH (ref 70–99)
Potassium: 3.4 mmol/L — ABNORMAL LOW (ref 3.5–5.1)
Sodium: 139 mmol/L (ref 135–145)

## 2021-05-03 LAB — CBC
HCT: 37.9 % — ABNORMAL LOW (ref 39.0–52.0)
Hemoglobin: 12.1 g/dL — ABNORMAL LOW (ref 13.0–17.0)
MCH: 30.9 pg (ref 26.0–34.0)
MCHC: 31.9 g/dL (ref 30.0–36.0)
MCV: 96.9 fL (ref 80.0–100.0)
Platelets: 222 10*3/uL (ref 150–400)
RBC: 3.91 MIL/uL — ABNORMAL LOW (ref 4.22–5.81)
RDW: 14.8 % (ref 11.5–15.5)
WBC: 6.7 10*3/uL (ref 4.0–10.5)
nRBC: 0 % (ref 0.0–0.2)

## 2021-05-03 LAB — HEPARIN LEVEL (UNFRACTIONATED)
Heparin Unfractionated: >1.1 IU/mL — ABNORMAL HIGH (ref 0.30–0.70)
Heparin Unfractionated: >1.1 IU/mL — ABNORMAL HIGH (ref 0.30–0.70)

## 2021-05-03 LAB — APTT
aPTT: 128 seconds — ABNORMAL HIGH (ref 24–36)
aPTT: 84 seconds — ABNORMAL HIGH (ref 24–36)

## 2021-05-03 MED ORDER — LIDOCAINE 5 % EX PTCH
1.0000 | MEDICATED_PATCH | CUTANEOUS | Status: DC
  Administered 2021-05-03 – 2021-05-09 (×7): 1 via TRANSDERMAL
  Filled 2021-05-03 (×7): qty 1

## 2021-05-03 NOTE — Evaluation (Addendum)
Physical Therapy Evaluation Patient Details Name: Randy Liu MRN: [id number] DOB: [birth year] Today's Date: 05/03/2021   History of Present Illness  70 y/o nonambulatory white male admitted with nausea vomiting with CT showing SBO. 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  Clinical Impression  On eval, pt required Max assist +2 for sliding board transfer, bed>drop arm recliner. Pt presents with general weakness, decreased activity tolerance, and impaired balance. Pt participated fairly well. At this time, recommendation is for SNF rehab, if possible. Will plan to follow and progress activity as tolerated.     Follow Up Recommendations SNF (HHPT and 24 hour supervision/assist if SNF not an option)    Equipment Recommendations   (hoyer lift)    Recommendations for Other Services OT consult     Precautions / Restrictions Precautions Precautions: Fall Restrictions Weight Bearing Restrictions: No      Mobility  Bed Mobility Overal bed mobility: Needs Assistance Bed Mobility: Supine to Sit     Supine to sit: Min assist;HOB elevated     General bed mobility comments: Pt gets out of bed on left side at home. Today pt able to get his legs mostly off of bed, then used upper bed rail to pull self more foreward then sit up with trunk 3/4 of the way before he needed A.    Transfers Overall transfer level: Needs assistance Equipment used: Sliding board Transfers: Lateral/Scoot Transfers          Lateral/Scoot Transfers: Max assist;+2 physical assistance;With slide board General transfer comment: Mod VCs for posture, anterior weightshift, proper/safe hand postioning on slide board. Assist to shift weight and slide pt across board, bed>drop arm recliner in several small scoots. Used bedpad to aid with scoot/slide  Ambulation/Gait              General Gait Details: nonambulatory  Stairs            Wheelchair Mobility    Modified Rankin (Stroke Patients Only)       Balance Overall balance assessment: Needs assistance;History of Falls Sitting-balance support: Bilateral upper extremity supported;Feet supported Sitting balance-Leahy Scale: Poor Sitting balance - Comments: Pt reports normally when he gets to the EOB he can get his balance and stay balanced without UE support, today he felt he needed at least one hand for support while he sat EOB     Standing balance-Leahy Scale: Zero                               Pertinent Vitals/Pain Pain Assessment: Faces Faces Pain Scale: Hurts little more Pain Location: right knee with flexion Pain Descriptors / Indicators: Sore;Aching Pain Intervention(s): Limited activity within patient's tolerance;Monitored during session    Home Living Family/patient expects to be discharged to:: Unsure Living Arrangements: Spouse/significant other Available Help at Discharge: Family;Available 24 hours/day Type of Home: House Home Access: Ramped entrance     Home Layout: One level Home Equipment: Wheelchair - [employer] - 2 wheels;Bedside commode;Tub bench;Grab bars - tub/shower;Grab bars - toilet;Hand held shower head;Hospital bed;Other (comment) Additional Comments: Uses public transportation for appointments    Prior Function Level of Independence: Needs assistance   Gait / Transfers Assistance Needed: SB xfers bed<>WC with assistance up until ~3-4 weeks prior to this admission.  ADL's / Homemaking Assistance Needed: spouse assisting with dressing, bathing, toileting  at bed level for past year  Comments: sleeps in hospital bed; transportiation services for MD appts due to inability to perform car transfers     Hand Dominance   Dominant Hand: Right    Extremity/Trunk Assessment   Upper Extremity Assessment Upper Extremity Assessment: Defer to OT  evaluation    Lower Extremity Assessment Lower Extremity Assessment: Generalized weakness (L LE better/stronger than R. R knee is painful)    Cervical / Trunk Assessment Cervical / Trunk Assessment: Normal  Communication   Communication: HOH (bil hearing aids)  Cognition Arousal/Alertness: Awake/alert Behavior During Therapy: WFL for tasks assessed/performed Overall Cognitive Status: Within Functional Limits for tasks assessed                                        General Comments      Exercises     Assessment/Plan    PT Assessment Patient needs continued PT services  PT Problem List Decreased strength;Decreased mobility;Decreased range of motion;Decreased activity tolerance;Decreased balance;Decreased knowledge of use of DME;Obesity;Pain       PT Treatment Interventions DME instruction;Therapeutic exercise;Functional mobility training;Therapeutic activities;Patient/family education;Balance training;Wheelchair mobility training    PT Goals (Current goals can be found in the Care Plan section)  Acute Rehab PT Goals Patient Stated Goal: to be able to transfer from his wheelchair to lift chair PT Goal Formulation: With patient Time For Goal Achievement: 05/17/21 Potential to Achieve Goals: Fair    Frequency Min 2X/week   Barriers to discharge        Co-evaluation               AM-PAC PT "6 Clicks" Mobility  Outcome Measure Help needed turning from your back to your side while in a flat bed without using bedrails?: A Lot Help needed moving from lying on your back to sitting on the side of a flat bed without using bedrails?: A Lot Help needed moving to and from a bed to a chair (including a wheelchair)?: Total Help needed standing up from a chair using your arms (e.g., wheelchair or bedside chair)?: Total Help needed to walk in hospital room?: Total Help needed climbing 3-5 steps with a railing? : Total 6 Click Score: 8    End of Session  Equipment Utilized During Treatment: Gait belt Activity Tolerance: Patient tolerated treatment well Patient left: in chair;with call bell/phone within reach Nurse Communication: Need for lift equipment PT Visit Diagnosis: Muscle weakness (generalized) (M62.81);History of falling (Z91.81);Other abnormalities of gait and mobility (R26.89)    Time: 2536-6440 PT Time Calculation (min) (ACUTE ONLY): 10 min   Charges:   PT Evaluation $PT Eval Moderate Complexity: 1 Mod            Faye Ramsay, PT Acute Rehabilitation  Office: [phone] Pager: [phone]

## 2021-05-03 NOTE — Progress Notes (Signed)
ANTICOAGULATION CONSULT NOTE  Pharmacy Consult for heparin  Indication: pulmonary embolus - bridge therapy while apixaban on hold  Allergies  Allergen Reactions   Metoclopramide Itching    Loss of Balance; Urinary Incontinence   Nsaids     Other reaction(s): Other (See Comments) Renal Insufficiency Kidney issues    Amoxicillin-Pot Clavulanate Diarrhea and Nausea And Vomiting    Other reaction(s): Other (See Comments) Abdomen Pain Abdomen pain    Statins     Other reaction(s): Other (See Comments) Myalgias and Myositis myalgias    Allopurinol Rash   Empagliflozin     Other reaction(s): Other (See Comments) Fainting, weakness, lightheaded, falling   Tiotropium     Other reaction(s): Other (See Comments) Urinary retention    Patient Measurements: Height: 6\' 1"  (185.4 cm) Weight: 119.3 kg (263 lb) IBW/kg (Calculated) : 79.9 Heparin Dosing Weight: 105.7  Vital Signs: Temp: 97.6 F (36.4 C) (08/20 0030) Temp Source: Oral (08/20 0030) BP: 129/63 (08/20 0453) Pulse Rate: 65 (08/20 0453)  Labs: Recent Labs    05/01/21 1150 05/01/21 1150 05/01/21 1901 05/02/21 0450 05/02/21 1301 05/03/21 0442  HGB 14.3  --   --  13.1  --  12.1*  HCT 43.0  --   --  41.1  --  37.9*  PLT 257  --   --  231  --  222  APTT  --    < > 34 89* 88* 128*  HEPARINUNFRC  --   --  >1.10* >1.10*  --  >1.10*  CREATININE 1.34*  --   --  1.43*  --  1.34*   < > = values in this interval not displayed.     Estimated Creatinine Clearance: 67.5 mL/min (A) (by C-G formula based on SCr of 1.34 mg/dL (H)).   Medical History: Past Medical History:  Diagnosis Date   Asthma    CHF (congestive heart failure) (HCC)    Diabetes mellitus without complication (HCC)    Hypertension     Medications:  Medications Prior to Admission  Medication Sig Dispense Refill Last Dose   Alpha-Lipoic Acid 300 MG TABS Take 600 mg by mouth daily.   04/30/2021   amLODipine (NORVASC) 10 MG tablet Take 1 tablet (10  mg total) by mouth daily. 30 tablet 0 04/30/2021   benzonatate (TESSALON) 200 MG capsule Take 200 mg by mouth every 8 (eight) hours as needed for cough.   unknown   carvedilol (COREG) 25 MG tablet Take 12.5 mg by mouth 2 (two) times daily.   04/30/2021   Cholecalciferol 25 MCG (1000 UT) capsule Take 4,000 Units by mouth in the morning and at bedtime.   04/30/2021   colchicine-probenecid 0.5-500 MG tablet Take 1 tablet by mouth daily.   04/30/2021   cyanocobalamin (,VITAMIN B-12,) 1000 MCG/ML injection Inject 1,000 mcg into the skin every 30 (thirty) days. 27th of month   04/09/2021   ELIQUIS 5 MG TABS tablet Take 5 mg by mouth 2 (two) times daily.   04/30/2021 at 2100   fenofibrate 160 MG tablet Take 160 mg by mouth daily.   04/30/2021   lansoprazole (PREVACID) 30 MG capsule Take 30 mg by mouth in the morning and at bedtime.   04/30/2021   latanoprost (XALATAN) 0.005 % ophthalmic solution Place 1 drop into the left eye at bedtime.   04/30/2021   magnesium oxide (MAG-OX) 400 MG tablet Take 400 mg by mouth 2 (two) times daily.   04/30/2021   meclizine (ANTIVERT) 25 MG tablet  Take 25 mg by mouth 3 (three) times daily as needed for nausea.   unknown   metoCLOPramide (REGLAN) 5 MG tablet Take 2.5 mg by mouth at bedtime. Take 1/2 tablet (2.5 mg) at bedtime   Past Week   montelukast (SINGULAIR) 10 MG tablet Take 10 mg by mouth at bedtime.   04/30/2021   NOVOLOG MIX 70/30 FLEXPEN (70-30) 100 UNIT/ML FlexPen Inject 0.4 mLs (40 Units total) into the skin 2 (two) times daily with a meal. 81 units in the AM and 68 units in the evening (Patient taking differently: Inject 60 Units into the skin in the morning and at bedtime.) 15 mL 11 04/29/2021   ondansetron (ZOFRAN) 8 MG tablet Take 8 mg by mouth every 8 (eight) hours as needed for nausea or vomiting.   04/30/2021   PROAIR RESPICLICK 108 (90 Base) MCG/ACT AEPB Inhale 2 puffs into the lungs every 6 (six) hours as needed (sob/wheezing).   unknown   Probiotic Product (ALIGN) 4  MG CAPS Take 4 mg by mouth daily.   04/30/2021   carvedilol (COREG) 12.5 MG tablet Take 1 tablet (12.5 mg total) by mouth 2 (two) times daily with a meal. (Patient not taking: No sig reported) 60 tablet 0 Not Taking at 2100   hydrALAZINE (APRESOLINE) 25 MG tablet Take 1 tablet (25 mg total) by mouth every 6 (six) hours. (Patient not taking: Reported on 05/01/2021) 120 tablet 0 Not Taking    Assessment: 70 yo M on apixaban PTA for hx PE & DVT.  Pharmacy consulted to dose heparin for bridge therapy while apixaban on hold for SBO PTA apixban 5 mg po bid> last dose 8/17 at 2100.  CBC WNL, SCr 1.34  05/03/2021: aPTT 128 sec - SUPRAtherapeutic on heparin 1700 units/hr Heparin level >1.1 as anticipated due to interaction with apixaban CBC: WNL No overt bleeding or infusion related issues per RN   Goal of Therapy:  Heparin level 0.3-0.7 units/ml aPTT 66-102 seconds Monitor platelets by anticoagulation protocol: Yes   Plan:  Decrease heparin infusion to 1500 units/hr  Recheck aPTT in 8h CBC, aPTT & heparin level in AM Will adjust heparin based on aPTT until apixaban cleared from system  Junita Push  PharmD 05/03/2021 6:55 AM

## 2021-05-03 NOTE — Progress Notes (Addendum)
ANTICOAGULATION CONSULT NOTE  Pharmacy Consult for heparin  Indication: pulmonary embolus - bridge therapy while apixaban on hold  Allergies  Allergen Reactions   Metoclopramide Itching    Loss of Balance; Urinary Incontinence   Nsaids     Other reaction(s): Other (See Comments) Renal Insufficiency Kidney issues    Amoxicillin-Pot Clavulanate Diarrhea and Nausea And Vomiting    Other reaction(s): Other (See Comments) Abdomen Pain Abdomen pain    Statins     Other reaction(s): Other (See Comments) Myalgias and Myositis myalgias    Allopurinol Rash   Empagliflozin     Other reaction(s): Other (See Comments) Fainting, weakness, lightheaded, falling   Tiotropium     Other reaction(s): Other (See Comments) Urinary retention    Patient Measurements: Height: 6\' 1"  (185.4 cm) Weight: 119.3 kg (263 lb) IBW/kg (Calculated) : 79.9 Heparin Dosing Weight: 105.7  Vital Signs: Temp: 97.5 F (36.4 C) (08/20 1426) Temp Source: Oral (08/20 1426) BP: 143/63 (08/20 1426) Pulse Rate: 59 (08/20 1426)  Labs: Recent Labs    05/01/21 1150 05/01/21 1901 05/02/21 0450 05/02/21 1301 05/03/21 0442 05/03/21 1605  HGB 14.3  --  13.1  --  12.1*  --   HCT 43.0  --  41.1  --  37.9*  --   PLT 257  --  231  --  222  --   APTT  --    < > 89* 88* 128* 84*  HEPARINUNFRC  --    < > >1.10*  --  >1.10* >1.10*  CREATININE 1.34*  --  1.43*  --  1.34*  --    < > = values in this interval not displayed.     Estimated Creatinine Clearance: 67.5 mL/min (A) (by C-G formula based on SCr of 1.34 mg/dL (H)).  Medications:  Medications Prior to Admission  Medication Sig Dispense Refill Last Dose   Alpha-Lipoic Acid 300 MG TABS Take 600 mg by mouth daily.   04/30/2021   amLODipine (NORVASC) 10 MG tablet Take 1 tablet (10 mg total) by mouth daily. 30 tablet 0 04/30/2021   benzonatate (TESSALON) 200 MG capsule Take 200 mg by mouth every 8 (eight) hours as needed for cough.   unknown   carvedilol  (COREG) 25 MG tablet Take 12.5 mg by mouth 2 (two) times daily.   04/30/2021   Cholecalciferol 25 MCG (1000 UT) capsule Take 4,000 Units by mouth in the morning and at bedtime.   04/30/2021   colchicine-probenecid 0.5-500 MG tablet Take 1 tablet by mouth daily.   04/30/2021   cyanocobalamin (,VITAMIN B-12,) 1000 MCG/ML injection Inject 1,000 mcg into the skin every 30 (thirty) days. 27th of month   04/09/2021   ELIQUIS 5 MG TABS tablet Take 5 mg by mouth 2 (two) times daily.   04/30/2021 at 2100   fenofibrate 160 MG tablet Take 160 mg by mouth daily.   04/30/2021   lansoprazole (PREVACID) 30 MG capsule Take 30 mg by mouth in the morning and at bedtime.   04/30/2021   latanoprost (XALATAN) 0.005 % ophthalmic solution Place 1 drop into the left eye at bedtime.   04/30/2021   magnesium oxide (MAG-OX) 400 MG tablet Take 400 mg by mouth 2 (two) times daily.   04/30/2021   meclizine (ANTIVERT) 25 MG tablet Take 25 mg by mouth 3 (three) times daily as needed for nausea.   unknown   metoCLOPramide (REGLAN) 5 MG tablet Take 2.5 mg by mouth at bedtime. Take 1/2 tablet (2.5 mg)  at bedtime   Past Week   montelukast (SINGULAIR) 10 MG tablet Take 10 mg by mouth at bedtime.   04/30/2021   NOVOLOG MIX 70/30 FLEXPEN (70-30) 100 UNIT/ML FlexPen Inject 0.4 mLs (40 Units total) into the skin 2 (two) times daily with a meal. 81 units in the AM and 68 units in the evening (Patient taking differently: Inject 60 Units into the skin in the morning and at bedtime.) 15 mL 11 04/29/2021   ondansetron (ZOFRAN) 8 MG tablet Take 8 mg by mouth every 8 (eight) hours as needed for nausea or vomiting.   04/30/2021   PROAIR RESPICLICK 108 (90 Base) MCG/ACT AEPB Inhale 2 puffs into the lungs every 6 (six) hours as needed (sob/wheezing).   unknown   Probiotic Product (ALIGN) 4 MG CAPS Take 4 mg by mouth daily.   04/30/2021   carvedilol (COREG) 12.5 MG tablet Take 1 tablet (12.5 mg total) by mouth 2 (two) times daily with a meal. (Patient not taking:  No sig reported) 60 tablet 0 Not Taking at 2100   hydrALAZINE (APRESOLINE) 25 MG tablet Take 1 tablet (25 mg total) by mouth every 6 (six) hours. (Patient not taking: Reported on 05/01/2021) 120 tablet 0 Not Taking    Assessment: 70 yo M on apixaban PTA for hx PE & DVT.  Pharmacy consulted to dose heparin for bridge therapy while apixaban on hold for SBO PTA apixban 5 mg po bid> last dose 8/17 at 2100.   05/03/2021: aPTT now therapeutic after reducing heparin to 1500 units/hr Heparin level >1.1 as anticipated due to interaction with apixaban CBC: Hgb slightly low; Plt WNL No overt bleeding or infusion related issues per RN   Goal of Therapy:  Heparin level 0.3-0.7 units/ml aPTT 66-102 seconds Monitor platelets by anticoagulation protocol: Yes   Plan:  Continue heparin infusion at 1500 units/hr  CBC, aPTT & heparin level in AM Will adjust heparin based on aPTT until apixaban cleared from system  Bernadene Person, PharmD, BCPS [phone] 05/03/2021, 5:49 PM

## 2021-05-03 NOTE — Progress Notes (Signed)
Subjective/Chief Complaint: Complains of hurting on his bottom from sitting too long. Reports small amounts of flatus   Objective: Vital signs in last 24 hours: Temp:  [97.4 F (36.3 C)-97.6 F (36.4 C)] 97.6 F (36.4 C) (08/20 0030) Pulse Rate:  [65-68] 65 (08/20 0453) Resp:  [16-18] 16 (08/20 0453) BP: (118-154)/(63-69) 129/63 (08/20 0453) SpO2:  [93 %-95 %] 93 % (08/20 0453) Last BM Date: 04/30/21  Intake/Output from previous day: 08/19 0701 - 08/20 0700 In: 448.4 [P.O.:30; I.V.:418.4] Out: 1200 [Urine:300; Emesis/NG output:900] Intake/Output this shift: Total I/O In: 0  Out: 50 [Emesis/NG output:50]  General appearance: alert and cooperative Resp: clear to auscultation bilaterally Cardio: regular rate and rhythm GI: soft, mild distension. Good bs. nontender  Lab Results:  Recent Labs    05/02/21 0450 05/03/21 0442  WBC 7.9 6.7  HGB 13.1 12.1*  HCT 41.1 37.9*  PLT 231 222   BMET Recent Labs    05/02/21 0450 05/03/21 0442  NA 140 139  K 4.0 3.4*  CL 101 101  CO2 28 25  GLUCOSE 196* 193*  BUN 22 24*  CREATININE 1.43* 1.34*  CALCIUM 9.8 9.4   PT/INR No results for input(s): LABPROT, INR in the last 72 hours. ABG No results for input(s): PHART, HCO3 in the last 72 hours.  Invalid input(s): PCO2, PO2  Studies/Results: CT ABDOMEN PELVIS W CONTRAST  Result Date: 05/01/2021 CLINICAL DATA:  Abdominal pain EXAM: CT ABDOMEN AND PELVIS WITH CONTRAST TECHNIQUE: Multidetector CT imaging of the abdomen and pelvis was performed using the standard protocol following bolus administration of intravenous contrast. CONTRAST:  OMNIPAQUE IOHEXOL 300 MG/ML  SOLN COMPARISON:  CT abdomen pelvis dated December 12, 2020 FINDINGS: Lower chest: Partially visualized small pericardial effusion which is similar to prior CT. Hepatobiliary: No focal liver abnormality is seen. Status post cholecystectomy. No biliary dilatation. Pancreas: Unremarkable. No pancreatic ductal  dilatation or surrounding inflammatory changes. Spleen: Normal in size without focal abnormality. Adrenals/Urinary Tract: Normal bilateral adrenal glands. Bilateral exophytic renal cysts. No hydronephrosis. Bladder is unremarkable. Stomach/Bowel: Stomach is unremarkable. Multiple dilated loops of small bowel with gradual transition point in lower mid abdomen seen on series 3, image 176. And decompressed loops of distal small bowel. No evidence of pneumatosis, free air, free fluid or bowel wall hypoenhancement. Vascular/Lymphatic: Aortic atherosclerosis. No enlarged abdominal or pelvic lymph nodes. Reproductive: Prostate is unremarkable. Other: Small fat containing left inguinal hernia. No abdominopelvic ascites. Musculoskeletal: No acute or significant osseous findings. IMPRESSION: Multiple dilated loops of small bowel with gradual transition point in the mid lower abdomen and decompressed loops of distal small bowel, findings are compatible small bowel obstruction. No evidence of pneumatosis, free air, free fluid or bowel wall hypoenhancement. Electronically Signed   By: Allegra Lai M.D.   On: 05/01/2021 14:15   DG Abd Portable 1V-Small Bowel Obstruction Protocol-24 hr delay  Result Date: 05/02/2021 CLINICAL DATA:  Small-bowel obstruction EXAM: PORTABLE ABDOMEN - 1 VIEW COMPARISON:  05/02/2020 FINDINGS: Three supine frontal views of the abdomen and pelvis are obtained, 24 hours after administration of oral contrast. Enteric catheter again identified in the region of the gastric fundus. There is persistent gaseous distention of the small bowel measuring up to 5.5 cm in diameter. I do not see any oral contrast on this examination. Minimal gas within the colon unchanged. Excreted contrast within the urinary bladder again noted. IMPRESSION: 1. Persistent gaseous distention of the small bowel consistent with small-bowel obstruction. No evidence of oral contrast on  this exam. Electronically Signed   By: Sharlet Salina M.D.   On: 05/02/2021 20:21   DG Abd Portable 1V-Small Bowel Obstruction Protocol-initial, 8 hr delay  Result Date: 05/02/2021 CLINICAL DATA:  Small-bowel protocol.  8 hour post contrast film. EXAM: PORTABLE ABDOMEN - 1 VIEW COMPARISON:  Abdominal radiograph dated 05/01/2021 and CT dated 05/01/2021. FINDINGS: Enteric tube with tip in the left upper abdomen likely in the body of the stomach. Persistent dilatation of air-filled loops of small bowel in the lower abdomen measuring up to approximately 6 cm in caliber. No free air identified. Right upper quadrant cholecystectomy clips. Excreted contrast noted in the bladder. The soft tissues are grossly unremarkable. IMPRESSION: Persistent dilatation of air-filled loops of small bowel in the lower abdomen. Electronically Signed   By: Elgie Collard M.D.   On: 05/02/2021 02:13   DG Abd Portable 1V  Result Date: 05/01/2021 CLINICAL DATA:  Encounter for nasogastric tube placement. EXAM: PORTABLE ABDOMEN - 1 VIEW COMPARISON:  None. FINDINGS: Two consecutive radiographs were obtained with the final radiograph demonstrating an enteric tube coursing below the hemidiaphragm with tip and side port overlying the expected region of the gastric lumen. The bowel gas pattern is normal. No radio-opaque calculi or other significant radiographic abnormality are seen. IMPRESSION: Enteric tube noted coursing below the hemidiaphragm with tip and side port overlying the expected region of the gastric lumen. Electronically Signed   By: Tish Frederickson M.D.   On: 05/01/2021 15:42    Anti-infectives: Anti-infectives (From admission, onward)    None       Assessment/Plan: s/p * No surgery found * Continue ng and bowel rest  Sbo Repeat abd xrays today S/P laparoscopic cholecystectomy, laparoscopic liver biopsy, primary umbilical  hernia repair 12/17/2020 Dr. Darnell Level  - am xray with persistent SBO. continue NGT to LIWS today and keep NPO - PT/OT for  mobilization  Hopefully will resolve nonoperatively.  If not improved by 48-72 hours or clinically worsens, consider laparoscopic possible open lysis of adhesions.  Transition zone appears to be more in the deeper abdomen and not at the site of primary hernia repair at the umbilicus or abdominal wall no evidence of any perforation or ischemia.  Patient not in shock.  No peritonitis.   FEN: NGT LIWS, IVF ID: No strong need for antibiotics DVT: SCDs. Heparin gtt   Hx bacteremia - 12/23/2020 Hypertension Type 2 diabetes hyperglycemic.  Defer to primary medicine service. obstructive sleep apnea -CPAP Asthma/Hx acute respiratory failure 12/13/2020 Hx diastolic congestive heart failure  CKD stage Liu Wheelchair-bound Obesity with weight loss BMI 38.4>> 34.7 Hx PE on Eliquis GERD  LOS: 2 days    Randy Liu 05/03/2021

## 2021-05-03 NOTE — Progress Notes (Signed)
PROGRESS NOTE   Randy Liu  FBP:[id number] DOB: August 08, 1951 DOA: 05/01/2021 PCP: Felton Clinton, MD  Brief Narrative:  70 year old obese white male BMI 76 Nonambulatory since 12/01/2020 gets around in wheelchair DM TY 2 gastroparesis IBS CKD 3 PE on anticoagulation since 2020 (see HPI) Thyroid nodules OSA/restrictive lung disease on CPAP  Admitted with small bowel obstruction  Hospital-Problem based course  Small bowel obstruction in the setting of prior small bowel obstructions 2020 and recent surgery 11/2020 NG tube, drip and suck, diet per surgery-s/p gastrograffin 8.18 Cont IVF d5 100cc/h ? 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 OSA on CPAP CPAP qhs Gout--not active hold colchicine probenecid at this time BMI 34.7-obesity class II Outpatient evaluation Rx   DVT prophylaxis:  Code Status:  Family Communication:  Disposition:  Status is: Inpatient  Remains inpatient appropriate because:Persistent severe electrolyte disturbances and Inpatient level of care appropriate due to severity of illness  Dispo: The patient is from: Home              Anticipated d/c is to: Home              Patient currently is not medically stable to d/c.   Difficult to place patient No     Consultants:  Gen surg  Procedures:   Antimicrobials:     Subjective:  + flatus with enema Patient family understand may need rpt surgery  Objective: Vitals:   05/02/21 1342 05/02/21 2002 05/03/21 0030 05/03/21  0453  BP: (!) 154/65 138/69 118/63 129/63  Pulse: 68 67 65 65  Resp: 18 16 16 16   Temp: (!) 97.4 F (36.3 C) (!) 97.5 F (36.4 C) 97.6 F (36.4 C)   TempSrc: Oral Oral Oral   SpO2: 94% 95% 94% 93%  Weight:      Height:        Intake/Output Summary (Last 24 hours) at 05/03/2021 1401 Last data filed at 05/03/2021 1000 Gross per 24 hour  Intake 311.04 ml  Output 800 ml  Net -488.96 ml    Filed Weights   05/01/21 1038  Weight: 119.3 kg    Examination:  NCAT thick neck Mallampati 4 S1-S2 no murmur CTA B no rales rhonchi Abdomen distended, but seems less swollen no hepatosplenomegaly  trace lower extremity edema  Data Reviewed: personally reviewed   CBC    Component Value Date/Time   WBC 6.7 05/03/2021 0442   RBC 3.91 (L) 05/03/2021 0442   HGB 12.1 (L) 05/03/2021 0442   HCT 37.9 (L) 05/03/2021 0442   PLT 222 05/03/2021 0442   MCV 96.9 05/03/2021 0442   MCH 30.9 05/03/2021 0442   MCHC 31.9 05/03/2021 0442   RDW 14.8 05/03/2021 0442   LYMPHSABS 1.4 05/01/2021 1150   MONOABS 0.5 05/01/2021 1150   EOSABS 0.1 05/01/2021 1150   BASOSABS 0.0 05/01/2021 1150   CMP Latest Ref Rng & Units 05/03/2021 05/02/2021 05/01/2021  Glucose 70 - 99 mg/dL 05/03/2021) 824(M) 353(I)  BUN 8 - 23 mg/dL 144(R) 22 15(Q)  Creatinine 0.61 -  1.24 mg/dL 0.93(G) 1.82(X) 9.37(J)  Sodium 135 - 145 mmol/L 139 140 136  Potassium 3.5 - 5.1 mmol/L 3.4(L) 4.0 4.3  Chloride 98 - 111 mmol/L 101 101 99  CO2 22 - 32 mmol/L 25 28 26   Calcium 8.9 - 10.3 mg/dL 9.4 9.8 9.5  Total Protein 6.5 - 8.1 g/dL - 6.8 7.1  Total Bilirubin 0.3 - 1.2 mg/dL - 0.8 0.6  Alkaline Phos 38 - 126 U/L - 36(L) 41  AST 15 - 41 U/L - 26 36  ALT 0 - 44 U/L - 29 33     Radiology Studies: DG Abd Portable 1V-Small Bowel Obstruction Protocol-24 hr delay  Result Date: 05/02/2021 CLINICAL DATA:  Small-bowel obstruction EXAM: PORTABLE ABDOMEN - 1 VIEW COMPARISON:  05/02/2020 FINDINGS: Three supine frontal views of the abdomen and pelvis  are obtained, 24 hours after administration of oral contrast. Enteric catheter again identified in the region of the gastric fundus. There is persistent gaseous distention of the small bowel measuring up to 5.5 cm in diameter. I do not see any oral contrast on this examination. Minimal gas within the colon unchanged. Excreted contrast within the urinary bladder again noted. IMPRESSION: 1. Persistent gaseous distention of the small bowel consistent with small-bowel obstruction. No evidence of oral contrast on this exam. Electronically Signed   By: 05/04/2020 M.D.   On: 05/02/2021 20:21   DG Abd Portable 1V-Small Bowel Obstruction Protocol-initial, 8 hr delay  Result Date: 05/02/2021 CLINICAL DATA:  Small-bowel protocol.  8 hour post contrast film. EXAM: PORTABLE ABDOMEN - 1 VIEW COMPARISON:  Abdominal radiograph dated 05/01/2021 and CT dated 05/01/2021. FINDINGS: Enteric tube with tip in the left upper abdomen likely in the body of the stomach. Persistent dilatation of air-filled loops of small bowel in the lower abdomen measuring up to approximately 6 cm in caliber. No free air identified. Right upper quadrant cholecystectomy clips. Excreted contrast noted in the bladder. The soft tissues are grossly unremarkable. IMPRESSION: Persistent dilatation of air-filled loops of small bowel in the lower abdomen. Electronically Signed   By: 05/03/2021 M.D.   On: 05/02/2021 02:13   DG Abd Portable 1V  Result Date: 05/01/2021 CLINICAL DATA:  Encounter for nasogastric tube placement. EXAM: PORTABLE ABDOMEN - 1 VIEW COMPARISON:  None. FINDINGS: Two consecutive radiographs were obtained with the final radiograph demonstrating an enteric tube coursing below the hemidiaphragm with tip and side port overlying the expected region of the gastric lumen. The bowel gas pattern is normal. No radio-opaque calculi or other significant radiographic abnormality are seen. IMPRESSION: Enteric tube noted coursing below the  hemidiaphragm with tip and side port overlying the expected region of the gastric lumen. Electronically Signed   By: 05/03/2021 M.D.   On: 05/01/2021 15:42     Scheduled Meds:  bisacodyl  10 mg Rectal Daily   insulin aspart  0-6 Units Subcutaneous Q4H   insulin aspart protamine- aspart  12 Units Subcutaneous BID WC   lidocaine  1 patch Transdermal Q24H   lip balm  1 application Topical BID   pantoprazole (PROTONIX) IV  40 mg Intravenous Q12H   Continuous Infusions:  dextrose 100 mL/hr at 05/03/21 0924   heparin 1,500 Units/hr (05/03/21 0839)   methocarbamol (ROBAXIN) IV     ondansetron (ZOFRAN) IV       LOS: 2 days   Time spent: 25  49, MD [facility] To contact the attending provider between 7A-7P or the covering provider during after hours  7P-7A, please log into the web site [email or web address] and access using universal [place] password for that web site. If you do not have the password, please call the hospital operator.  05/03/2021, 2:01 PM

## 2021-05-03 NOTE — Evaluation (Signed)
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 Pt reports it has been ~3-4 weeks since he got out of bed to W/C so did well with Max A +2 for this today to drop arm recliner with transfer board. Pt reports he was getting HHOT/PT 1x/week pta working on balance and transfers. Pt will continue to benefit from acute OT with follow at SNF or HHOT if wife can manage him at home with hoyer lift added to their equipment. We will continue to follow.    Follow Up Recommendations  SNF (unless patient's wife feels she can manage him at home with hoyer lift added, then [facility])    Equipment Recommendations  Other (comment) (hoyer lift)       Precautions / Restrictions Precautions Precautions: Fall Restrictions Weight Bearing Restrictions: No      Mobility Bed Mobility Overal bed mobility: Needs Assistance Bed Mobility: Supine to Sit     Supine to sit: Min assist;HOB elevated     General bed mobility comments: Pt gets out of bed on left side at home. Today pt able to get his legs mostly off of bed, then use upper bed rail to pull self more foreward then sit up with trunk 3/4 of the way before he needed A    Transfers Overall transfer level: Needs assistance Equipment used: Sliding board Transfers: Lateral/Scoot Transfers           Lateral/Scoot Transfers: Max assist;+2 physical assistance;With slide board General transfer comment: VCs to stay leaning forward, then pt could lift minimally to help [place] transfer him across from bed>drop arm recliner in small scoots with use of bedpad on top of transfer board    Balance Overall balance assessment: Needs assistance Sitting-balance support: Bilateral upper extremity supported;Feet supported Sitting balance-Leahy Scale: Poor Sitting balance - Comments: Pt reports normally when he gets to the EOB he can get his balance and stay balanced without UE support, today he felt he needed at least one hand for support while he sat EOB                                   ADL either performed or assessed with clinical judgement   ADL Overall ADL's : Needs assistance/impaired Eating/Feeding: Set up;Bed level   Grooming: Set up;Bed level   Upper Body Bathing: Minimal assistance;Bed level   Lower Body Bathing: Total assistance;Bed level   Upper Body Dressing : Maximal assistance;Bed level   Lower Body Dressing: Total assistance;Bed level   Toilet Transfer: Maximal assistance;+2 for physical assistance;Transfer board Toilet Transfer Details (indicate cue type and reason): simulated bed>drop arm recliner (going to pt's right--the direction he goes to get out of the bed)                 Vision Patient Visual Report: No change from baseline  Pertinent Vitals/Pain Pain Assessment: Faces Faces Pain Scale: Hurts little more Pain Location: right knee with flexion Pain Descriptors / Indicators: Sore;Aching Pain Intervention(s): Limited activity within patient's tolerance;Monitored during session (RN called MD and said pt could bring in his own Biofreeze from home.)     Hand Dominance Right   Extremity/Trunk Assessment Upper Extremity Assessment Upper Extremity Assessment: Generalized weakness   Lower Extremity Assessment Lower Extremity  Assessment:  (moves LLE better than RLE, knee pain in RLE.)   Cervical / Trunk Assessment Cervical / Trunk Assessment: Normal   Communication Communication Communication: HOH (has Bil hearing aids)   Cognition Arousal/Alertness: Awake/alert Behavior During Therapy: WFL for tasks assessed/performed Overall Cognitive Status: Within Functional Limits for tasks assessed                                                Home Living Family/patient expects to be discharged to:: Skilled nursing facility (v. home depending on level of A) Living Arrangements: Spouse/significant other Available Help at Discharge: Family;Available 24 hours/day Type of Home: House Home Access: Ramped entrance     Home Layout: One level     Bathroom Shower/Tub: Producer, television/film/video: Handicapped height     Home Equipment: Wheelchair - [employer] - 2 wheels;Bedside commode;Tub bench;Grab bars - tub/shower;Grab bars - toilet;Hand held shower head;Hospital bed;Other (comment) Office manager transfer board)   Additional Comments: Uses public transportation for appointments      Prior Functioning/Environment Level of Independence: Needs assistance  Gait / Transfers Assistance Needed: slide board transfer to w/c with assist from spouse, sleeping in hospital bed, use transportation services for doctor's appointments due to inability to car transfer ADL's / Homemaking Assistance Needed: spouse assisting with dressing, bathing, toileting at bed level for past year            OT Problem List: Decreased strength;Decreased range of motion;Impaired balance (sitting and/or standing);Obesity      OT Treatment/Interventions: Self-care/ADL training;DME and/or AE instruction;Patient/family education;Balance training    OT Goals(Current goals can be found in the care plan section) Acute Rehab OT Goals Patient Stated Goal: to be able to transfer from his wheelchair to lift chair OT Goal  Formulation: With patient Time For Goal Achievement: 05/17/21 Potential to Achieve Goals: Fair  OT Frequency: Min 2X/week    AM-PAC OT "6 Clicks" Daily Activity     Outcome Measure Help from another person eating meals?: A Little Help from another person taking care of personal grooming?: A Little Help from another person toileting, which includes using toliet, bedpan, or urinal?: Total Help from another person bathing (including washing, rinsing, drying)?: A Lot Help from another person to put on and taking off regular upper body clothing?: A Lot Help from another person to put on and taking off regular lower body clothing?: Total 6 Click Score: 12   End of Session Equipment Utilized During Treatment:  (transfer board) Nurse Communication: Mobility status;Need for lift equipment (lift pad left under patient)  Activity Tolerance: Patient tolerated treatment well Patient left: in chair;with call bell/phone within reach;with chair alarm set  OT Visit Diagnosis: Other abnormalities of gait and mobility (R26.89);Muscle weakness (generalized) (M62.81)                Time: 6384-6659 OT Time Calculation (min): 51 min Charges:  OT General Charges $OT Visit: 1 Visit OT  Evaluation $OT Eval Moderate Complexity: 1 Mod OT Treatments $Self Care/Home Management : 8-22 mins Randy Liu, OTR/L Acute Rehab Services Pager [phone] Office [phone] ]   Evette Georges 05/03/2021, 12:07 PM

## 2021-05-03 NOTE — Progress Notes (Signed)
RT went to inquire pt about his home cpap. He got his home cpap in the room but pt still got NG tube in place and he requested not to wear cpap and wait until the NG tube is removed. We will set the cpap up when the NG tube is out.

## 2021-05-03 NOTE — TOC Progression Note (Signed)
Transition of Care [facility]) - Progression Note    Patient Details  Name: Randy Liu MRN: [id number] Date of Birth: 1951/05/18  Transition [facility]) CM/SW Contact  Geni Bers, RN Phone Number: 05/03/2021, 12:11 PM  Clinical Narrative:    Spoke with pt's wife concerning discharge plans and need. [name] states that she is not sure of any DME need at present time.  She was thinking about a Nurse, adult but not sure. Pt have at home: Hospital bed, [facility] and a slide board. Explained to wife that hospital use [facility] to transportation pt's home. She knew about [facility] and was okay with that service. Pt may need surgery. TOC will continue to follow pt for discharge needs.    Expected Discharge Plan: Home w Home Health Services Barriers to Discharge: Continued Medical Work up  Expected Discharge Plan and Services Expected Discharge Plan: Home w Home Health Services   Discharge Planning Services: CM Consult   Living arrangements for the past 2 months: Single Family Home                             HH Agency: [facility] Date [facility] Agency Contacted: 05/02/21 Time HH Agency Contacted: 1014 Representative spoke with at [facility] Agency: Marylene Land   Social Determinants of Health (SDOH) Interventions    Readmission Risk Interventions No flowsheet data found.

## 2021-05-04 ENCOUNTER — Inpatient Hospital Stay (HOSPITAL_COMMUNITY): Payer: Medicare Other

## 2021-05-04 LAB — CBC
HCT: 38.2 % — ABNORMAL LOW (ref 39.0–52.0)
Hemoglobin: 12.5 g/dL — ABNORMAL LOW (ref 13.0–17.0)
MCH: 30.9 pg (ref 26.0–34.0)
MCHC: 32.7 g/dL (ref 30.0–36.0)
MCV: 94.3 fL (ref 80.0–100.0)
Platelets: 197 10*3/uL (ref 150–400)
RBC: 4.05 MIL/uL — ABNORMAL LOW (ref 4.22–5.81)
RDW: 14.4 % (ref 11.5–15.5)
WBC: 7.5 10*3/uL (ref 4.0–10.5)
nRBC: 0 % (ref 0.0–0.2)

## 2021-05-04 LAB — GLUCOSE, CAPILLARY
Glucose-Capillary: 197 mg/dL — ABNORMAL HIGH (ref 70–99)
Glucose-Capillary: 232 mg/dL — ABNORMAL HIGH (ref 70–99)
Glucose-Capillary: 216 mg/dL — ABNORMAL HIGH (ref 70–99)
Glucose-Capillary: 241 mg/dL — ABNORMAL HIGH (ref 70–99)
Glucose-Capillary: 216 mg/dL — ABNORMAL HIGH (ref 70–99)
Glucose-Capillary: 185 mg/dL — ABNORMAL HIGH (ref 70–99)

## 2021-05-04 LAB — APTT
aPTT: 56 seconds — ABNORMAL HIGH (ref 24–36)
aPTT: 66 seconds — ABNORMAL HIGH (ref 24–36)

## 2021-05-04 LAB — HEPARIN LEVEL (UNFRACTIONATED): Heparin Unfractionated: 0.75 IU/mL — ABNORMAL HIGH (ref 0.30–0.70)

## 2021-05-04 NOTE — Progress Notes (Signed)
PROGRESS NOTE   Randy Liu  OEV:[id number] DOB: 1950/10/03 DOA: 05/01/2021 PCP: Felton Clinton, MD  Brief Narrative:  70 year old obese white male BMI 14 Nonambulatory since 12/01/2020 gets around in wheelchair DM TY 2 gastroparesis IBS CKD 3 PE on anticoagulation since 2020 (see HPI) Thyroid nodules OSA/restrictive lung disease on CPAP  Admitted with small bowel obstruction  Hospital-Problem based course  Small bowel obstruction in the setting of prior small bowel obstructions 2020 and recent surgery 11/2020 NG tube removed by general surgery--diet per surgeon Risk of pneumonia secondary to recent NG tube Patient continuously coughing-check two-view chest x-ray today= HFpEF Hold Coreg 12.5 twice daily placed on metoprolol IV 5 mg every 6 hourly heart rate >100 Resume meds when reliably taking p.o. 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 OSA on CPAP CPAP when able Gout--not active hold colchicine probenecid at this time BMI 34.7-obesity class II Outpatient evaluation Rx   DVT prophylaxis:  Code Status:  Family Communication:  Disposition:  Status is: Inpatient  Remains inpatient appropriate because:Persistent severe electrolyte disturbances and Inpatient level of care appropriate due to severity of illness  Dispo: The patient is from: Home              Anticipated d/c is to: Home              Patient currently is not medically stable to d/c.   Difficult to place patient No     Consultants:  Gen surg  Procedures:   Antimicrobials:     Subjective:  Passing stool passing gas doing  better NG out + Cough and sputum About to have liquids for the first time  Objective: Vitals:   05/03/21 0453 05/03/21 1426 05/03/21 2049 05/04/21 0527  BP: 129/63 (!) 143/63 (!) 150/72 (!) 163/69  Pulse: 65 (!) 59 61 68  Resp: 16  18 18   Temp:  (!) 97.5 F (36.4 C) (!) 97.5 F (36.4 C) 97.7 F (36.5 C)  TempSrc:  Oral Oral Oral  SpO2: 93% 98% 97% 95%  Weight:      Height:        Intake/Output Summary (Last 24 hours) at 05/04/2021 0936 Last data filed at 05/04/2021 0752 Gross per 24 hour  Intake 3190.04 ml  Output 1400 ml  Net 1790.04 ml    Filed Weights   05/01/21 1038  Weight: 119.3 kg    Examination:  NCAT thick neck Mallampati 4 Poor exam on posterior chest exam S1-S2 no murmur Abdomen distended less so Cannot appreciate hepatosplenomegaly Trace lower extremity edema  Data Reviewed: personally reviewed   CBC    Component Value Date/Time   WBC 7.5 05/04/2021 0335   RBC 4.05 (L) 05/04/2021 0335   HGB 12.5 (L) 05/04/2021 0335   HCT 38.2 (L) 05/04/2021 0335   PLT 197 05/04/2021 0335   MCV 94.3 05/04/2021 0335   MCH 30.9 05/04/2021 0335   MCHC 32.7 05/04/2021 0335   RDW 14.4 05/04/2021 0335   LYMPHSABS 1.4 05/01/2021 1150   MONOABS 0.5 05/01/2021 1150   EOSABS 0.1 05/01/2021 1150   BASOSABS 0.0 05/01/2021 1150   CMP Latest Ref Rng &  Units 05/03/2021 05/02/2021 05/01/2021  Glucose 70 - 99 mg/dL 914(N) 829(F) 621(H)  BUN 8 - 23 mg/dL 08(M) 22 57(Q)  Creatinine 0.61 - 1.24 mg/dL 4.69(G) 2.95(M) 8.41(L)  Sodium 135 - 145 mmol/L 139 140 136  Potassium 3.5 - 5.1 mmol/L 3.4(L) 4.0 4.3  Chloride 98 - 111 mmol/L 101 101 99  CO2 22 - 32 mmol/L 25 28 26   Calcium 8.9 - 10.3 mg/dL 9.4 9.8 9.5  Total Protein 6.5 - 8.1 g/dL - 6.8 7.1  Total Bilirubin 0.3 - 1.2 mg/dL - 0.8 0.6  Alkaline Phos 38 - 126 U/L - 36(L) 41  AST 15 - 41 U/L - 26 36  ALT 0 - 44 U/L - 29 33     Radiology Studies: DG Abd 2 Views  Result Date: 05/03/2021 CLINICAL DATA:  Abdominal  distention. Nasogastric tube. Small-bowel obstruction. EXAM: ABDOMEN - 2 VIEW COMPARISON:  05/02/2021 FINDINGS: Nasogastric tube is in place, tip overlying the level of the stomach. There has been improvement in dilatation of small bowel loops. Persistent dilatation of LEFT abdominal small bowel loops persist. Gas is identified in nondilated loops of colon. IMPRESSION: Interval placement of nasogastric tube. Improvement in small bowel dilatation. Electronically Signed   By: 05/04/2021 M.D.   On: 05/03/2021 14:34   DG Abd Portable 1V-Small Bowel Obstruction Protocol-24 hr delay  Result Date: 05/02/2021 CLINICAL DATA:  Small-bowel obstruction EXAM: PORTABLE ABDOMEN - 1 VIEW COMPARISON:  05/02/2020 FINDINGS: Three supine frontal views of the abdomen and pelvis are obtained, 24 hours after administration of oral contrast. Enteric catheter again identified in the region of the gastric fundus. There is persistent gaseous distention of the small bowel measuring up to 5.5 cm in diameter. I do not see any oral contrast on this examination. Minimal gas within the colon unchanged. Excreted contrast within the urinary bladder again noted. IMPRESSION: 1. Persistent gaseous distention of the small bowel consistent with small-bowel obstruction. No evidence of oral contrast on this exam. Electronically Signed   By: 05/04/2020 M.D.   On: 05/02/2021 20:21     Scheduled Meds:  bisacodyl  10 mg Rectal Daily   insulin aspart  0-6 Units Subcutaneous Q4H   insulin aspart protamine- aspart  12 Units Subcutaneous BID WC   lidocaine  1 patch Transdermal Q24H   lip balm  1 application Topical BID   pantoprazole (PROTONIX) IV  40 mg Intravenous Q12H   Continuous Infusions:  dextrose 100 mL/hr at 05/04/21 0551   heparin 1,600 Units/hr (05/04/21 0548)   methocarbamol (ROBAXIN) IV     ondansetron (ZOFRAN) IV       LOS: 3 days   Time spent: 92  30, MD [facility] To contact the  attending provider between 7A-7P or the covering provider during after hours 7P-7A, please log into the web site [email or web address] and access using universal [place] password for that web site. If you do not have the password, please call the hospital operator.  05/04/2021, 9:36 AM

## 2021-05-04 NOTE — Progress Notes (Signed)
ANTICOAGULATION CONSULT NOTE  Pharmacy Consult for heparin  Indication: pulmonary embolus - bridge therapy while apixaban on hold  Allergies  Allergen Reactions   Metoclopramide Itching    Loss of Balance; Urinary Incontinence   Nsaids     Other reaction(s): Other (See Comments) Renal Insufficiency Kidney issues    Amoxicillin-Pot Clavulanate Diarrhea and Nausea And Vomiting    Other reaction(s): Other (See Comments) Abdomen Pain Abdomen pain    Statins     Other reaction(s): Other (See Comments) Myalgias and Myositis myalgias    Allopurinol Rash   Empagliflozin     Other reaction(s): Other (See Comments) Fainting, weakness, lightheaded, falling   Tiotropium     Other reaction(s): Other (See Comments) Urinary retention    Patient Measurements: Height: 6\' 1"  (185.4 cm) Weight: 119.3 kg (263 lb) IBW/kg (Calculated) : 79.9 Heparin Dosing Weight: 105.7  Vital Signs: Temp: 97.5 F (36.4 C) (08/20 2049) Temp Source: Oral (08/20 2049) BP: 150/72 (08/20 2049) Pulse Rate: 61 (08/20 2049)  Labs: Recent Labs    05/01/21 1150 05/01/21 1901 05/02/21 0450 05/02/21 1301 05/03/21 0442 05/03/21 1605 05/04/21 0335  HGB 14.3  --  13.1  --  12.1*  --  12.5*  HCT 43.0  --  41.1  --  37.9*  --  38.2*  PLT 257  --  231  --  222  --  197  APTT  --    < > 89*   < > 128* 84* 56*  HEPARINUNFRC  --    < > >1.10*  --  >1.10* >1.10* 0.75*  CREATININE 1.34*  --  1.43*  --  1.34*  --   --    < > = values in this interval not displayed.     Estimated Creatinine Clearance: 67.5 mL/min (A) (by C-G formula based on SCr of 1.34 mg/dL (H)).  Medications:  Medications Prior to Admission  Medication Sig Dispense Refill Last Dose   Alpha-Lipoic Acid 300 MG TABS Take 600 mg by mouth daily.   04/30/2021   amLODipine (NORVASC) 10 MG tablet Take 1 tablet (10 mg total) by mouth daily. 30 tablet 0 04/30/2021   benzonatate (TESSALON) 200 MG capsule Take 200 mg by mouth every 8 (eight) hours as  needed for cough.   unknown   carvedilol (COREG) 25 MG tablet Take 12.5 mg by mouth 2 (two) times daily.   04/30/2021   Cholecalciferol 25 MCG (1000 UT) capsule Take 4,000 Units by mouth in the morning and at bedtime.   04/30/2021   colchicine-probenecid 0.5-500 MG tablet Take 1 tablet by mouth daily.   04/30/2021   cyanocobalamin (,VITAMIN B-12,) 1000 MCG/ML injection Inject 1,000 mcg into the skin every 30 (thirty) days. 27th of month   04/09/2021   ELIQUIS 5 MG TABS tablet Take 5 mg by mouth 2 (two) times daily.   04/30/2021 at 2100   fenofibrate 160 MG tablet Take 160 mg by mouth daily.   04/30/2021   lansoprazole (PREVACID) 30 MG capsule Take 30 mg by mouth in the morning and at bedtime.   04/30/2021   latanoprost (XALATAN) 0.005 % ophthalmic solution Place 1 drop into the left eye at bedtime.   04/30/2021   magnesium oxide (MAG-OX) 400 MG tablet Take 400 mg by mouth 2 (two) times daily.   04/30/2021   meclizine (ANTIVERT) 25 MG tablet Take 25 mg by mouth 3 (three) times daily as needed for nausea.   unknown   metoCLOPramide (REGLAN) 5 MG  tablet Take 2.5 mg by mouth at bedtime. Take 1/2 tablet (2.5 mg) at bedtime   Past Week   montelukast (SINGULAIR) 10 MG tablet Take 10 mg by mouth at bedtime.   04/30/2021   NOVOLOG MIX 70/30 FLEXPEN (70-30) 100 UNIT/ML FlexPen Inject 0.4 mLs (40 Units total) into the skin 2 (two) times daily with a meal. 81 units in the AM and 68 units in the evening (Patient taking differently: Inject 60 Units into the skin in the morning and at bedtime.) 15 mL 11 04/29/2021   ondansetron (ZOFRAN) 8 MG tablet Take 8 mg by mouth every 8 (eight) hours as needed for nausea or vomiting.   04/30/2021   PROAIR RESPICLICK 108 (90 Base) MCG/ACT AEPB Inhale 2 puffs into the lungs every 6 (six) hours as needed (sob/wheezing).   unknown   Probiotic Product (ALIGN) 4 MG CAPS Take 4 mg by mouth daily.   04/30/2021   carvedilol (COREG) 12.5 MG tablet Take 1 tablet (12.5 mg total) by mouth 2 (two)  times daily with a meal. (Patient not taking: No sig reported) 60 tablet 0 Not Taking at 2100   hydrALAZINE (APRESOLINE) 25 MG tablet Take 1 tablet (25 mg total) by mouth every 6 (six) hours. (Patient not taking: Reported on 05/01/2021) 120 tablet 0 Not Taking    Assessment: 70 yo M on apixaban PTA for hx PE & DVT.  Pharmacy consulted to dose heparin for bridge therapy while apixaban on hold for SBO PTA apixban 5 mg po bid> last dose 8/17 at 2100.   05/04/2021: aPTT 56sec- now subtherapeutic on heparin 1500 units/hr Heparin level 0.75- trending down but still elevated as anticipated due to interaction with apixaban CBC: Hgb slightly low but stable; Plt WNL No overt bleeding or infusion related issues per RN   Goal of Therapy:  Heparin level 0.3-0.7 units/ml aPTT 66-102 seconds Monitor platelets by anticoagulation protocol: Yes   Plan:  Increase heparin infusion to 1600 units/hr  Recheck heparin level in 8hrs CBC, aPTT & heparin level in AM Will adjust heparin based on aPTT until apixaban cleared from system  Junita Push, PharmD, BCPS [phone] 05/04/2021, 5:24 AM

## 2021-05-04 NOTE — Progress Notes (Signed)
ANTICOAGULATION CONSULT NOTE  Pharmacy Consult for heparin  Indication: pulmonary embolus - bridge therapy while apixaban on hold  Allergies  Allergen Reactions   Metoclopramide Itching    Loss of Balance; Urinary Incontinence   Nsaids     Other reaction(s): Other (See Comments) Renal Insufficiency Kidney issues    Amoxicillin-Pot Clavulanate Diarrhea and Nausea And Vomiting    Other reaction(s): Other (See Comments) Abdomen Pain Abdomen pain    Statins     Other reaction(s): Other (See Comments) Myalgias and Myositis myalgias    Allopurinol Rash   Empagliflozin     Other reaction(s): Other (See Comments) Fainting, weakness, lightheaded, falling   Tiotropium     Other reaction(s): Other (See Comments) Urinary retention    Patient Measurements: Height: 6\' 1"  (185.4 cm) Weight: 119.3 kg (263 lb) IBW/kg (Calculated) : 79.9 Heparin Dosing Weight: 105.7  Vital Signs: Temp: 97.7 F (36.5 C) (08/21 1315) Temp Source: Oral (08/21 1315) BP: 128/79 (08/21 1315) Pulse Rate: 76 (08/21 1315)  Labs: Recent Labs    05/02/21 0450 05/02/21 1301 05/03/21 0442 05/03/21 1605 05/04/21 0335 05/04/21 1355  HGB 13.1  --  12.1*  --  12.5*  --   HCT 41.1  --  37.9*  --  38.2*  --   PLT 231  --  222  --  197  --   APTT 89*   < > 128* 84* 56* 66*  HEPARINUNFRC >1.10*  --  >1.10* >1.10* 0.75*  --   CREATININE 1.43*  --  1.34*  --   --   --    < > = values in this interval not displayed.     Estimated Creatinine Clearance: 67.5 mL/min (A) (by C-G formula based on SCr of 1.34 mg/dL (H)).  Medications:  Medications Prior to Admission  Medication Sig Dispense Refill Last Dose   Alpha-Lipoic Acid 300 MG TABS Take 600 mg by mouth daily.   04/30/2021   amLODipine (NORVASC) 10 MG tablet Take 1 tablet (10 mg total) by mouth daily. 30 tablet 0 04/30/2021   benzonatate (TESSALON) 200 MG capsule Take 200 mg by mouth every 8 (eight) hours as needed for cough.   unknown   carvedilol  (COREG) 25 MG tablet Take 12.5 mg by mouth 2 (two) times daily.   04/30/2021   Cholecalciferol 25 MCG (1000 UT) capsule Take 4,000 Units by mouth in the morning and at bedtime.   04/30/2021   colchicine-probenecid 0.5-500 MG tablet Take 1 tablet by mouth daily.   04/30/2021   cyanocobalamin (,VITAMIN B-12,) 1000 MCG/ML injection Inject 1,000 mcg into the skin every 30 (thirty) days. 27th of month   04/09/2021   ELIQUIS 5 MG TABS tablet Take 5 mg by mouth 2 (two) times daily.   04/30/2021 at 2100   fenofibrate 160 MG tablet Take 160 mg by mouth daily.   04/30/2021   lansoprazole (PREVACID) 30 MG capsule Take 30 mg by mouth in the morning and at bedtime.   04/30/2021   latanoprost (XALATAN) 0.005 % ophthalmic solution Place 1 drop into the left eye at bedtime.   04/30/2021   magnesium oxide (MAG-OX) 400 MG tablet Take 400 mg by mouth 2 (two) times daily.   04/30/2021   meclizine (ANTIVERT) 25 MG tablet Take 25 mg by mouth 3 (three) times daily as needed for nausea.   unknown   metoCLOPramide (REGLAN) 5 MG tablet Take 2.5 mg by mouth at bedtime. Take 1/2 tablet (2.5 mg) at bedtime  Past Week   montelukast (SINGULAIR) 10 MG tablet Take 10 mg by mouth at bedtime.   04/30/2021   NOVOLOG MIX 70/30 FLEXPEN (70-30) 100 UNIT/ML FlexPen Inject 0.4 mLs (40 Units total) into the skin 2 (two) times daily with a meal. 81 units in the AM and 68 units in the evening (Patient taking differently: Inject 60 Units into the skin in the morning and at bedtime.) 15 mL 11 04/29/2021   ondansetron (ZOFRAN) 8 MG tablet Take 8 mg by mouth every 8 (eight) hours as needed for nausea or vomiting.   04/30/2021   PROAIR RESPICLICK 108 (90 Base) MCG/ACT AEPB Inhale 2 puffs into the lungs every 6 (six) hours as needed (sob/wheezing).   unknown   Probiotic Product (ALIGN) 4 MG CAPS Take 4 mg by mouth daily.   04/30/2021   carvedilol (COREG) 12.5 MG tablet Take 1 tablet (12.5 mg total) by mouth 2 (two) times daily with a meal. (Patient not taking:  No sig reported) 60 tablet 0 Not Taking at 2100   hydrALAZINE (APRESOLINE) 25 MG tablet Take 1 tablet (25 mg total) by mouth every 6 (six) hours. (Patient not taking: Reported on 05/01/2021) 120 tablet 0 Not Taking    Assessment: 70 yo M on apixaban PTA for hx PE & DVT.  Pharmacy consulted to dose heparin for bridge therapy while apixaban on hold for SBO PTA apixban 5 mg po bid> last dose 8/17 at 2100.   05/04/2021: aPTT improved to therapeutic but borderline low on heparin 1600 units/hr (note aPTT was previously high on 1700 units/hr) AM Heparin level 0.75- trending down but still elevated as anticipated due to interaction with apixaban CBC: Hgb slightly low but stable; Plt WNL No overt bleeding or infusion related issues per RN   Goal of Therapy:  Heparin level 0.3-0.7 units/ml aPTT 66-102 seconds Monitor platelets by anticoagulation protocol: Yes   Plan:  Continue heparin infusion at 1600 units/hr (given previously high aPTT on 1700 units/hr, will wait and see if pt has not yet reached steady state Confirmatory aPTT in 8hrs CBC, aPTT & heparin level in AM Will adjust heparin based on aPTT until apixaban cleared from system  Bernadene Person, PharmD, BCPS [phone] 05/04/2021, 2:55 PM

## 2021-05-04 NOTE — Progress Notes (Signed)
Subjective/Chief Complaint: Working on moving around in bed. Denies n/v. NG has been clear thin nonbilious fluid. Having flatus and had bm yesterday as well.   Objective: Vital signs in last 24 hours: Temp:  [97.5 F (36.4 C)-97.7 F (36.5 C)] 97.7 F (36.5 C) (08/21 0527) Pulse Rate:  [59-68] 68 (08/21 0527) Resp:  [18] 18 (08/21 0527) BP: (143-163)/(63-72) 163/69 (08/21 0527) SpO2:  [95 %-98 %] 95 % (08/21 0527) Last BM Date: 04/30/21  Intake/Output from previous day: 08/20 0701 - 08/21 0700 In: 3190 [I.V.:3190] Out: 1250 [Urine:475; Emesis/NG output:775] Intake/Output this shift: Total I/O In: -  Out: 150 [Urine:150]  General appearance: alert and cooperative Resp: clear to auscultation bilaterally Cardio: regular rate and rhythm GI: soft, nontender, nondistended  Lab Results:  Recent Labs    05/03/21 0442 05/04/21 0335  WBC 6.7 7.5  HGB 12.1* 12.5*  HCT 37.9* 38.2*  PLT 222 197   BMET Recent Labs    05/02/21 0450 05/03/21 0442  NA 140 139  K 4.0 3.4*  CL 101 101  CO2 28 25  GLUCOSE 196* 193*  BUN 22 24*  CREATININE 1.43* 1.34*  CALCIUM 9.8 9.4   PT/INR No results for input(s): LABPROT, INR in the last 72 hours. ABG No results for input(s): PHART, HCO3 in the last 72 hours.  Invalid input(s): PCO2, PO2  Studies/Results: DG Abd 2 Views  Result Date: 05/03/2021 CLINICAL DATA:  Abdominal distention. Nasogastric tube. Small-bowel obstruction. EXAM: ABDOMEN - 2 VIEW COMPARISON:  05/02/2021 FINDINGS: Nasogastric tube is in place, tip overlying the level of the stomach. There has been improvement in dilatation of small bowel loops. Persistent dilatation of LEFT abdominal small bowel loops persist. Gas is identified in nondilated loops of colon. IMPRESSION: Interval placement of nasogastric tube. Improvement in small bowel dilatation. Electronically Signed   By: Norva Pavlov M.D.   On: 05/03/2021 14:34   DG Abd Portable 1V-Small Bowel Obstruction  Protocol-24 hr delay  Result Date: 05/02/2021 CLINICAL DATA:  Small-bowel obstruction EXAM: PORTABLE ABDOMEN - 1 VIEW COMPARISON:  05/02/2020 FINDINGS: Three supine frontal views of the abdomen and pelvis are obtained, 24 hours after administration of oral contrast. Enteric catheter again identified in the region of the gastric fundus. There is persistent gaseous distention of the small bowel measuring up to 5.5 cm in diameter. I do not see any oral contrast on this examination. Minimal gas within the colon unchanged. Excreted contrast within the urinary bladder again noted. IMPRESSION: 1. Persistent gaseous distention of the small bowel consistent with small-bowel obstruction. No evidence of oral contrast on this exam. Electronically Signed   By: Sharlet Salina M.D.   On: 05/02/2021 20:21    Anti-infectives: Anti-infectives (From admission, onward)    None       Assessment/Plan: s/p * No surgery found * Continue ng and bowel rest  Sbo Repeat abd xrays today S/P laparoscopic cholecystectomy, laparoscopic liver biopsy, primary umbilical  hernia repair 12/17/2020 Dr. Darnell Level  - Clinically resolved, radiographically improving as well.  - PT/OT for mobilization   FEN: NG tube removed, start clear liquids, advance to full liquids as tolerated today ID: no abx DVT: SCDs. Heparin gtt   Hx bacteremia - 12/23/2020 Hypertension Type 2 diabetes hyperglycemic.  Defer to primary medicine service. obstructive sleep apnea -CPAP Asthma/Hx acute respiratory failure 12/13/2020 Hx diastolic congestive heart failure  CKD stage III Wheelchair-bound Obesity with weight loss BMI 38.4>> 34.7 Hx PE on Eliquis GERD  LOS: 3 days  Stephanie Coup Boluwatife Mutchler 05/04/2021

## 2021-05-05 LAB — CBC
HCT: 39 % (ref 39.0–52.0)
HCT: 37.5 % — ABNORMAL LOW (ref 39.0–52.0)
Hemoglobin: 12.7 g/dL — ABNORMAL LOW (ref 13.0–17.0)
Hemoglobin: 12.4 g/dL — ABNORMAL LOW (ref 13.0–17.0)
MCH: 31.1 pg (ref 26.0–34.0)
MCH: 31.1 pg (ref 26.0–34.0)
MCHC: 32.6 g/dL (ref 30.0–36.0)
MCHC: 33.1 g/dL (ref 30.0–36.0)
MCV: 95.4 fL (ref 80.0–100.0)
MCV: 94 fL (ref 80.0–100.0)
Platelets: 188 10*3/uL (ref 150–400)
Platelets: 189 10*3/uL (ref 150–400)
RBC: 4.09 MIL/uL — ABNORMAL LOW (ref 4.22–5.81)
RBC: 3.99 MIL/uL — ABNORMAL LOW (ref 4.22–5.81)
RDW: 14.4 % (ref 11.5–15.5)
RDW: 14.4 % (ref 11.5–15.5)
WBC: 6.7 10*3/uL (ref 4.0–10.5)
WBC: 7.1 10*3/uL (ref 4.0–10.5)
nRBC: 0 % (ref 0.0–0.2)
nRBC: 0 % (ref 0.0–0.2)

## 2021-05-05 LAB — GLUCOSE, CAPILLARY
Glucose-Capillary: 162 mg/dL — ABNORMAL HIGH (ref 70–99)
Glucose-Capillary: 178 mg/dL — ABNORMAL HIGH (ref 70–99)
Glucose-Capillary: 183 mg/dL — ABNORMAL HIGH (ref 70–99)
Glucose-Capillary: 196 mg/dL — ABNORMAL HIGH (ref 70–99)
Glucose-Capillary: 210 mg/dL — ABNORMAL HIGH (ref 70–99)
Glucose-Capillary: 221 mg/dL — ABNORMAL HIGH (ref 70–99)

## 2021-05-05 LAB — BASIC METABOLIC PANEL
Anion gap: 9 (ref 5–15)
BUN: 11 mg/dL (ref 8–23)
CO2: 26 mmol/L (ref 22–32)
Calcium: 9.3 mg/dL (ref 8.9–10.3)
Chloride: 103 mmol/L (ref 98–111)
Creatinine, Ser: 1.07 mg/dL (ref 0.61–1.24)
GFR, Estimated: >60 mL/min (ref >60)
Glucose, Bld: 250 mg/dL — ABNORMAL HIGH (ref 70–99)
Potassium: 3.2 mmol/L — ABNORMAL LOW (ref 3.5–5.1)
Sodium: 138 mmol/L (ref 135–145)

## 2021-05-05 LAB — HEPARIN LEVEL (UNFRACTIONATED): Heparin Unfractionated: 0.7 IU/mL (ref 0.30–0.70)

## 2021-05-05 LAB — APTT
aPTT: 79 seconds — ABNORMAL HIGH (ref 24–36)
aPTT: 91 seconds — ABNORMAL HIGH (ref 24–36)

## 2021-05-05 MED ORDER — PANTOPRAZOLE SODIUM 40 MG PO TBEC
40.0000 mg | DELAYED_RELEASE_TABLET | Freq: Two times a day (BID) | ORAL | Status: DC
  Administered 2021-05-05 – 2021-05-09 (×6): 40 mg via ORAL
  Filled 2021-05-05 (×7): qty 1

## 2021-05-05 MED ORDER — CARVEDILOL 6.25 MG PO TABS
6.2500 mg | ORAL_TABLET | Freq: Two times a day (BID) | ORAL | Status: DC
  Administered 2021-05-05 – 2021-05-09 (×10): 6.25 mg via ORAL
  Filled 2021-05-05 (×9): qty 1

## 2021-05-05 MED ORDER — POTASSIUM CHLORIDE CRYS ER 20 MEQ PO TBCR
30.0000 meq | EXTENDED_RELEASE_TABLET | ORAL | Status: AC
  Administered 2021-05-05 (×2): 30 meq via ORAL
  Filled 2021-05-05 (×2): qty 1

## 2021-05-05 MED ORDER — APIXABAN 5 MG PO TABS
5.0000 mg | ORAL_TABLET | Freq: Two times a day (BID) | ORAL | Status: DC
  Administered 2021-05-05 – 2021-05-06 (×4): 5 mg via ORAL
  Filled 2021-05-05 (×4): qty 1

## 2021-05-05 MED ORDER — INSULIN ASPART PROT & ASPART (70-30 MIX) 100 UNIT/ML ~~LOC~~ SUSP
20.0000 [IU] | Freq: Once | SUBCUTANEOUS | Status: AC
  Administered 2021-05-05: 20 [IU] via SUBCUTANEOUS
  Filled 2021-05-05: qty 10

## 2021-05-05 MED ORDER — INSULIN ASPART PROT & ASPART (70-30 MIX) 100 UNIT/ML ~~LOC~~ SUSP
30.0000 [IU] | Freq: Two times a day (BID) | SUBCUTANEOUS | Status: DC
  Administered 2021-05-05 – 2021-05-09 (×6): 30 [IU] via SUBCUTANEOUS
  Filled 2021-05-05: qty 10

## 2021-05-05 NOTE — NC FL2 (Signed)
[place] MEDICAID FL2 LEVEL OF CARE SCREENING TOOL     IDENTIFICATION  Patient Name: Randy Liu Birthdate: [birth year] Sex: male Admission Date (Current Location): 05/01/2021  [facility] and [place] Number:  Producer, television/film/video and Address:  [facility],  [address] [place] [address]      Provider Number: [id number]  Attending Physician Name and Address:  Lanae Boast, MD  Relative Name and Phone Number:  wife, [phone]    Current Level of Care: Hospital Recommended Level of Care: Skilled Nursing Facility Prior Approval Number:    Date Approved/Denied:   PASRR Number: [id number]  Discharge Plan: SNF    Current Diagnoses: Patient Active Problem List   Diagnosis Date Noted   Small bowel obstruction (HCC) 05/01/2021   SBO (small bowel obstruction) (HCC) 05/01/2021   Yeast infection 12/15/2020   Elevated LFTs 12/15/2020   Acute hypoxemic respiratory failure (HCC) 12/13/2020   Hyponatremia 12/13/2020   Asthma 12/13/2020   Diabetes (HCC) 12/13/2020   Acute cholangitis 12/12/2020    Orientation RESPIRATION BLADDER Height & Weight     Self, Time, Situation, Place  Normal Continent, Indwelling catheter Weight: 263 lb (119.3 kg) Height:  6\' 1"  (185.4 cm)  BEHAVIORAL SYMPTOMS/MOOD NEUROLOGICAL BOWEL NUTRITION STATUS      Continent    AMBULATORY STATUS COMMUNICATION OF NEEDS Skin   Total Care Verbally Normal                       Personal Care Assistance Level of Assistance  Bathing, Dressing Bathing Assistance: Maximum assistance   Dressing Assistance: Maximum assistance     Functional Limitations Info             SPECIAL CARE FACTORS FREQUENCY  PT (By licensed PT), OT (By licensed OT)     PT Frequency: 5x/wk OT Frequency: 5x/wk            Contractures Contractures Info: Not present    Additional Factors Info  Allergies, Code Status, Insulin Sliding Scale Code Status Info: Full Allergies Info: Metoclopramide    Nsaids   Amoxicillin-pot Clavulanate   Statins   Allopurinol   Empagliflozin   Tiotropium           Current Medications (05/05/2021):  This is the current hospital active medication list Current Facility-Administered Medications  Medication Dose Route Frequency Provider Last Rate Last Admin   acetaminophen (TYLENOL) suppository 650 mg  650 mg Rectal Q6H PRN 05/07/2021, MD       alum & mag hydroxide-simeth (MAALOX/MYLANTA) 200-200-20 MG/5ML suspension 30 mL  30 mL Oral Q6H PRN 03-23-2001, MD       apixaban Karie Soda) tablet 5 mg  5 mg Oral BID Everlene Balls, RPH   5 mg at 05/05/21 1037   bisacodyl (DULCOLAX) suppository 10 mg  10 mg Rectal Daily 05/07/21, MD   10 mg at 05/05/21 0854   carvedilol (COREG) tablet 6.25 mg  6.25 mg Oral BID WC Kc, Ramesh, MD   6.25 mg at 05/05/21 0854   dextrose 5 % solution   Intravenous Continuous 05/07/21, MD 100 mL/hr at 05/05/21 1201 New Bag at 05/05/21 1201   diphenhydrAMINE (BENADRYL) injection 12.5-25 mg  12.5-25 mg Intravenous Q6H PRN 10-14-1984, MD       guaiFENesin-dextromethorphan (ROBITUSSIN DM) 100-10 MG/5ML syrup 10 mL  10 mL Oral Q4H PRN Karie Soda, MD       hydrocortisone (ANUSOL-HC) 2.5 % rectal cream 1 application  1 application Topical QID PRN Karie Soda, MD       hydrocortisone cream 1 % 1 application  1 application Topical TID PRN Karie Soda, MD       HYDROmorphone (DILAUDID) injection 0.5-2 mg  0.5-2 mg Intravenous Q2H PRN Karie Soda, MD       insulin aspart (novoLOG) injection 0-6 Units  0-6 Units Subcutaneous Q4H Rhetta Mura, MD   2 Units at 05/05/21 1200   insulin aspart protamine- aspart (NOVOLOG MIX 70/30) injection 12 Units  12 Units Subcutaneous BID WC Rhetta Mura, MD   12 Units at 05/05/21 0821   lidocaine (LIDODERM) 5 % 1 patch  1 patch Transdermal Q24H Rhetta Mura, MD   1 patch at 05/05/21 1200   lip balm (CARMEX) ointment 1 application  1 application Topical BID Karie Soda, MD   1 application at 05/05/21 0569   magic mouthwash  15 mL Oral QID PRN Karie Soda, MD       menthol-cetylpyridinium (CEPACOL) lozenge 3 mg  1 lozenge Oral PRN Karie Soda, MD   3 mg at 05/04/21 0610   methocarbamol (ROBAXIN) 1,000 mg in dextrose 5 % 100 mL IVPB  1,000 mg Intravenous Q6H PRN Karie Soda, MD       ondansetron [facility]) injection 4 mg  4 mg Intravenous Q6H PRN Karie Soda, MD       Or   ondansetron (ZOFRAN) 8 mg in sodium chloride 0.9 % 50 mL IVPB  8 mg Intravenous Q6H PRN Karie Soda, MD       pantoprazole (PROTONIX) EC tablet 40 mg  40 mg Oral BID Keturah Barre M, RPH       phenol (CHLORASEPTIC) mouth spray 2 spray  2 spray Mouth/Throat PRN Karie Soda, MD   2 spray at 05/03/21 1805   prochlorperazine (COMPAZINE) injection 5-10 mg  5-10 mg Intravenous Q4H PRN Karie Soda, MD       simethicone (MYLICON) 40 MG/0.6ML suspension 80 mg  80 mg Oral QID PRN Karie Soda, MD         Discharge Medications: Please see discharge summary for a list of discharge medications.  Relevant Imaging Results:  Relevant Lab Results:   Additional Information SS#[id number]; COVID vaccine x 3  Annica Marinello, LCSW

## 2021-05-05 NOTE — TOC Progression Note (Signed)
Transition of Care [facility]) - Progression Note    Patient Details  Name: Randy Liu MRN: [id number] Date of Birth: 09/11/1951  [facility]) CM/SW Contact  Lennart Pall, LCSW Phone Number: 05/05/2021, 12:13 PM  Clinical Narrative:    Met with pt and wife to review therapy recommendations for SNF.  They are aware and in full agreement with this plan.  Notes he has been to SNFs in past and we discussed preferred facilities.  FL2 has been sent out and will await bed offers.   Expected Discharge Plan: [place] Barriers to Discharge: Continued Medical Work up  Expected Discharge Plan and Services Expected Discharge Plan: [place]   Discharge Planning Services: CM Consult   Living arrangements for the past 2 months: [place] Agency: [place] Date [place]: 05/02/21 Time [place]: 1014 Representative spoke with at [place]: [place] ([place]) Interventions    Readmission Risk Interventions No flowsheet data found.

## 2021-05-05 NOTE — Progress Notes (Signed)
PROGRESS NOTE    Johnwesley Lederman  OXB:[id number] DOB: [birth year] DOA: 05/01/2021 PCP: Felton Clinton, MD   Chief Complaint  Patient presents with   Emesis  Brief Narrative: 70 year old male with history of morbid obesity BMI 34, nonambulatory since 12/01/2020 WC bound type 2 diabetes  on long-term insulin NovoLog Mix 70/30 12 units bid, with gastroparesis on reglan, history of CKD stage IIIA baseline creatinine ~1.3, PE on on Eliquis anticoagulation since 2020, thyroid nodules, OSA/restrictive lung disease on CPAP, hypertension on amlodipine, carvedilol, GERD and PPI, HLD on fenofibrate admitted with a small bowel obstruction in the setting of prior small bowel obstruction in 2020 and recent surgery on March/2022. Patient followed by general surgery managed conservatively with NG decompression.  Subjective: Seen this morning.  Patient getting suppository this morning.  Denies nausea vomiting abdominal pain , has ahd no bowel movement but passing gas.  Assessment & Plan:  Small bowel obstruction  in the setting of prior small bowel obstruction in 2020 and recent surgery on March/2022-status post laparoscopic cholecystectomy, laparoscopic liver biopsy, primary umbilical hernia repair 12/17/2020.  Advance to soft diet this morning.  Patient has not had a bowel movement, trying suppository. Appreciate surgery input.  Type 2 diabetes  on long-term insulin NovoLog Mix 70/30 with gastroparesis on reglan.  HbA1c stable 7.9 .CBG borderline controlled.  Continue mix insulin  12 units twice daily, sliding scale. Recent Labs  Lab 05/04/21 1935 05/04/21 2248 05/05/21 0048 05/05/21 0354 05/05/21 0816  GLUCAP 216* 185* 196* 183* 221*    Functional quadriplegia/nonambulatory since 12/01/2020 WC bound.  Continue PT OT supportive care.  He lives with his wife at home  CHF with preserved EF Hypertension on amlodipine/carvedilol at home Mild lower leg edema present.  Blood pressure on higher side   resuming coreg on lower dose. Continue to hold amlodipine.  CKD stage IIIA baseline creatinine ~1.3: Currently stable at baseline bmp pending today. Recent Labs  Lab 05/01/21 1150 05/02/21 0450 05/03/21 0442  BUN 26* 22 24*  CREATININE 1.34* 1.43* 1.34*    Prior provoked DVT/PE on on Eliquis anticoagulation since 2020: Currently on heparin gtt. transition to DOAC today as he is taking orally.    OSA/restrictive lung disease 2/2 ? Covid:on CPAP, Respiratory status is   Gout not active  GERD  on PPI  HLD on fenofibrateat home. resume  Morbid obesity BMI 34, will benefit with weight loss, healthy lifestyle and PCP follow-up.  Diet Order             DIET SOFT Room service appropriate? Yes; Fluid consistency: Thin  Diet effective now                  Patient's Body mass index is 34.7 kg/m. DVT prophylaxis: HEPARIN GTT Code Status:   Code Status: Full Code  Family Communication: plan of care discussed with patient at bedside. StatUs is: Inpatient Remains inpatient appropriate because:Inpatient level of care appropriate due to severity of illness Dispo:  Patient From: Home  Planned Disposition: Home with Health Care Svc  Medically stable for discharge: No. Anticipate d/c in am.    Unresulted Labs (From admission, onward)     Start     Ordered   05/04/21 0500  APTT  Daily,   R      05/03/21 1753   Unscheduled  Basic metabolic panel  Once-Timed,   R        05/05/21 0919   Unscheduled  CBC  ONCE - STAT,  R        05/05/21 0919           Medications reviewed: Scheduled Meds:  bisacodyl  10 mg Rectal Daily   carvedilol  6.25 mg Oral BID WC   insulin aspart  0-6 Units Subcutaneous Q4H   insulin aspart protamine- aspart  12 Units Subcutaneous BID WC   lidocaine  1 patch Transdermal Q24H   lip balm  1 application Topical BID   pantoprazole (PROTONIX) IV  40 mg Intravenous Q12H   Continuous Infusions:  dextrose 100 mL/hr at 05/05/21 0223   heparin 1,600  Units/hr (05/05/21 0506)   methocarbamol (ROBAXIN) IV     ondansetron (ZOFRAN) IV     Consultants:see note  Procedures:see note Antimicrobials: Anti-infectives (From admission, onward)    None      Culture/Microbiology    Component Value Date/Time   SDES  12/26/2020 1602    URINE, RANDOM Performed at [facility], [address], [place], [place] [address]    SPECREQUEST  12/26/2020 1602    NONE Performed at [facility], [address], [place], [place] [address]    CULT 30,000 COLONIES/mL YEAST (A) 12/26/2020 1602   REPTSTATUS 12/28/2020 FINAL 12/26/2020 1602    Other culture-see note  Objective: Vitals: Today's Vitals   05/04/21 2303 05/05/21 0338 05/05/21 0418 05/05/21 0549  BP:    (!) 148/65  Pulse:    67  Resp:    18  Temp:    97.9 F (36.6 C)  TempSrc:    Oral  SpO2:    96%  Weight:      Height:      PainSc: 0-No pain Asleep 0-No pain     Intake/Output Summary (Last 24 hours) at 05/05/2021 0919 Last data filed at 05/05/2021 0900 Gross per 24 hour  Intake 2601.79 ml  Output 1945 ml  Net 656.79 ml   Filed Weights   05/01/21 1038  Weight: 119.3 kg   Weight change:   Intake/Output from previous day: 08/21 0701 - 08/22 0700 In: 2481.8 [P.O.:300; I.V.:2181.8] Out: 1795 [Urine:1795] Intake/Output this shift: Total I/O In: 120 [P.O.:120] Out: 300 [Urine:300] Filed Weights   05/01/21 1038  Weight: 119.3 kg   Examination: General exam: AAOx3, pleasant, morbidly obese, no acute distress. HEENT:Oral mucosa moist, Ear/Nose WNL grossly,dentition normal. Respiratory system: bilaterally diminished,no use of accessory muscle, non tender. Cardiovascular system: S1 & S2 +,No JVD. Gastrointestinal system: Abdomen soft, NT,ND, BS+. Nervous System:Alert, awake, moving extremities Extremities: Lower leg edema, distal peripheral pulses palpable.  Skin: No rashes,no icterus. MSK: Normal muscle bulk,tone, power Data  Reviewed: I have personally reviewed following labs and imaging studies CBC: Recent Labs  Lab 05/01/21 1150 05/02/21 0450 05/03/21 0442 05/04/21 0335 05/05/21 0407  WBC 7.7 7.9 6.7 7.5 6.7  NEUTROABS 5.7  --   --   --   --   HGB 14.3 13.1 12.1* 12.5* 12.7*  HCT 43.0 41.1 37.9* 38.2* 39.0  MCV 94.3 97.6 96.9 94.3 95.4  PLT 257 231 222 197 188   Basic Metabolic Panel: Recent Labs  Lab 05/01/21 1150 05/02/21 0450 05/03/21 0442  NA 136 140 139  K 4.3 4.0 3.4*  CL 99 101 101  CO2 26 28 25   GLUCOSE 250* 196* 193*  BUN 26* 22 24*  CREATININE 1.34* 1.43* 1.34*  CALCIUM 9.5 9.8 9.4   GFR: Estimated Creatinine Clearance: 67.5 mL/min (A) (by C-G formula based on SCr of 1.34 mg/dL (H)). Liver  Function Tests: Recent Labs  Lab 05/01/21 1150 05/02/21 0450  AST 36 26  ALT 33 29  ALKPHOS 41 36*  BILITOT 0.6 0.8  PROT 7.1 6.8  ALBUMIN 3.7 3.5   Recent Labs  Lab 05/01/21 1150  LIPASE 19   No results for input(s): AMMONIA in the last 168 hours. Coagulation Profile: No results for input(s): INR, PROTIME in the last 168 hours. Cardiac Enzymes: No results for input(s): CKTOTAL, CKMB, CKMBINDEX, TROPONINI in the last 168 hours. BNP (last 3 results) No results for input(s): PROBNP in the last 8760 hours. HbA1C: No results for input(s): HGBA1C in the last 72 hours. CBG: Recent Labs  Lab 05/04/21 1935 05/04/21 2248 05/05/21 0048 05/05/21 0354 05/05/21 0816  GLUCAP 216* 185* 196* 183* 221*   Lipid Profile: No results for input(s): CHOL, HDL, LDLCALC, TRIG, CHOLHDL, LDLDIRECT in the last 72 hours. Thyroid Function Tests: No results for input(s): TSH, T4TOTAL, FREET4, T3FREE, THYROIDAB in the last 72 hours. Anemia Panel: No results for input(s): VITAMINB12, FOLATE, FERRITIN, TIBC, IRON, RETICCTPCT in the last 72 hours. Sepsis Labs: No results for input(s): PROCALCITON, LATICACIDVEN in the last 168 hours.  Recent Results (from the past 240 hour(s))  Resp Panel by  RT-PCR (Flu A&B, Covid) Nasopharyngeal Swab     Status: None   Collection Time: 05/01/21  2:35 PM   Specimen: Nasopharyngeal Swab; Nasopharyngeal(NP) swabs in vial transport medium  Result Value Ref Range Status   SARS Coronavirus 2 by RT PCR NEGATIVE NEGATIVE Final    Comment: (NOTE) SARS-CoV-2 target nucleic acids are NOT DETECTED.  The SARS-CoV-2 RNA is generally detectable in upper respiratory specimens during the acute phase of infection. The lowest concentration of SARS-CoV-2 viral copies this assay can detect is 138 copies/mL. A negative result does not preclude SARS-Cov-2 infection and should not be used as the sole basis for treatment or other patient management decisions. A negative result may occur with  improper specimen collection/handling, submission of specimen other than nasopharyngeal swab, presence of viral mutation(s) within the areas targeted by this assay, and inadequate number of viral copies(<138 copies/mL). A negative result must be combined with clinical observations, patient history, and epidemiological information. The expected result is Negative.  Fact Sheet for Patients:  BloggerCourse.com  Fact Sheet for Healthcare Providers:  SeriousBroker.it  This test is no t yet approved or cleared by the [place] FDA and  has been authorized for detection and/or diagnosis of SARS-CoV-2 by FDA under an Emergency Use Authorization (EUA). This EUA will remain  in effect (meaning this test can be used) for the duration of the COVID-19 declaration under Section 564(b)(1) of the Act, 21 U.S.C.section 360bbb-3(b)(1), unless the authorization is terminated  or revoked sooner.       Influenza A by PCR NEGATIVE NEGATIVE Final   Influenza B by PCR NEGATIVE NEGATIVE Final    Comment: (NOTE) The Xpert Xpress SARS-CoV-2/FLU/RSV plus assay is intended as an aid in the diagnosis of influenza from Nasopharyngeal swab  specimens and should not be used as a sole basis for treatment. Nasal washings and aspirates are unacceptable for Xpert Xpress SARS-CoV-2/FLU/RSV testing.  Fact Sheet for Patients: BloggerCourse.com  Fact Sheet for Healthcare Providers: SeriousBroker.it  This test is not yet approved or cleared by the [place] FDA and has been authorized for detection and/or diagnosis of SARS-CoV-2 by FDA under an Emergency Use Authorization (EUA). This EUA will remain in effect (meaning this test can be used) for the duration of the COVID-19  declaration under Section 564(b)(1) of the Act, 21 U.S.C. section 360bbb-3(b)(1), unless the authorization is terminated or revoked.  Performed at [facility], [address] [place], [place] [id number]      Radiology Studies: DG Chest 2 View  Result Date: 05/04/2021 CLINICAL DATA:  Pneumonia, noticeable wheezing and shortness of breath EXAM: CHEST - 2 VIEW COMPARISON:  12/26/2020 FINDINGS: Low lung volumes. Infrahilar infiltrate or atelectasis, slightly improved on the left and increased on the right since prior study. Heart size and mediastinal contours are within normal limits. Aortic Atherosclerosis (ICD10-170.0). Blunting of left lateral costophrenic angle suggesting spots full left effusion. No pneumothorax. Visualized bones unremarkable. IMPRESSION: Low volumes. Infrahilar atelectasis or infiltrate, left greater than right. Electronically Signed   By: Corlis Leak  Hassell M.D.   On: 05/04/2021 12:38   DG Abd 2 Views  Result Date: 05/03/2021 CLINICAL DATA:  Abdominal distention. Nasogastric tube. Small-bowel obstruction. EXAM: ABDOMEN - 2 VIEW COMPARISON:  05/02/2021 FINDINGS: Nasogastric tube is in place, tip overlying the level of the stomach. There has been improvement in dilatation of small bowel loops. Persistent dilatation of LEFT abdominal small bowel loops persist. Gas is identified in  nondilated loops of colon. IMPRESSION: Interval placement of nasogastric tube. Improvement in small bowel dilatation. Electronically Signed   By: Norva PavlovElizabeth  Brown M.D.   On: 05/03/2021 14:34     LOS: 4 days   Lanae Boastamesh Sayed Apostol, MD [facility]  05/05/2021, 9:19 AM

## 2021-05-05 NOTE — Progress Notes (Signed)
ANTICOAGULATION CONSULT NOTE  Pharmacy Consult for heparin  Indication: pulmonary embolus - bridge therapy while apixaban on hold  Allergies  Allergen Reactions   Metoclopramide Itching    Loss of Balance; Urinary Incontinence   Nsaids     Other reaction(s): Other (See Comments) Renal Insufficiency Kidney issues    Amoxicillin-Pot Clavulanate Diarrhea and Nausea And Vomiting    Other reaction(s): Other (See Comments) Abdomen Pain Abdomen pain    Statins     Other reaction(s): Other (See Comments) Myalgias and Myositis myalgias    Allopurinol Rash   Empagliflozin     Other reaction(s): Other (See Comments) Fainting, weakness, lightheaded, falling   Tiotropium     Other reaction(s): Other (See Comments) Urinary retention    Patient Measurements: Height: 6\' 1"  (185.4 cm) Weight: 119.3 kg (263 lb) IBW/kg (Calculated) : 79.9 Heparin Dosing Weight: 105.7  Vital Signs: Temp: 97.7 F (36.5 C) (08/21 2046) Temp Source: Oral (08/21 2046) BP: 140/77 (08/21 2046) Pulse Rate: 74 (08/21 2046)  Labs: Recent Labs    05/02/21 0450 05/02/21 1301 05/03/21 0442 05/03/21 1605 05/04/21 0335 05/04/21 1355 05/04/21 2252  HGB 13.1  --  12.1*  --  12.5*  --   --   HCT 41.1  --  37.9*  --  38.2*  --   --   PLT 231  --  222  --  197  --   --   APTT 89*   < > 128* 84* 56* 66* 79*  HEPARINUNFRC >1.10*  --  >1.10* >1.10* 0.75*  --   --   CREATININE 1.43*  --  1.34*  --   --   --   --    < > = values in this interval not displayed.     Estimated Creatinine Clearance: 67.5 mL/min (A) (by C-G formula based on SCr of 1.34 mg/dL (H)).  Medications:  Medications Prior to Admission  Medication Sig Dispense Refill Last Dose   Alpha-Lipoic Acid 300 MG TABS Take 600 mg by mouth daily.   04/30/2021   amLODipine (NORVASC) 10 MG tablet Take 1 tablet (10 mg total) by mouth daily. 30 tablet 0 04/30/2021   benzonatate (TESSALON) 200 MG capsule Take 200 mg by mouth every 8 (eight) hours as  needed for cough.   unknown   carvedilol (COREG) 25 MG tablet Take 12.5 mg by mouth 2 (two) times daily.   04/30/2021   Cholecalciferol 25 MCG (1000 UT) capsule Take 4,000 Units by mouth in the morning and at bedtime.   04/30/2021   colchicine-probenecid 0.5-500 MG tablet Take 1 tablet by mouth daily.   04/30/2021   cyanocobalamin (,VITAMIN B-12,) 1000 MCG/ML injection Inject 1,000 mcg into the skin every 30 (thirty) days. 27th of month   04/09/2021   ELIQUIS 5 MG TABS tablet Take 5 mg by mouth 2 (two) times daily.   04/30/2021 at 2100   fenofibrate 160 MG tablet Take 160 mg by mouth daily.   04/30/2021   lansoprazole (PREVACID) 30 MG capsule Take 30 mg by mouth in the morning and at bedtime.   04/30/2021   latanoprost (XALATAN) 0.005 % ophthalmic solution Place 1 drop into the left eye at bedtime.   04/30/2021   magnesium oxide (MAG-OX) 400 MG tablet Take 400 mg by mouth 2 (two) times daily.   04/30/2021   meclizine (ANTIVERT) 25 MG tablet Take 25 mg by mouth 3 (three) times daily as needed for nausea.   unknown   metoCLOPramide (REGLAN)  5 MG tablet Take 2.5 mg by mouth at bedtime. Take 1/2 tablet (2.5 mg) at bedtime   Past Week   montelukast (SINGULAIR) 10 MG tablet Take 10 mg by mouth at bedtime.   04/30/2021   NOVOLOG MIX 70/30 FLEXPEN (70-30) 100 UNIT/ML FlexPen Inject 0.4 mLs (40 Units total) into the skin 2 (two) times daily with a meal. 81 units in the AM and 68 units in the evening (Patient taking differently: Inject 60 Units into the skin in the morning and at bedtime.) 15 mL 11 04/29/2021   ondansetron (ZOFRAN) 8 MG tablet Take 8 mg by mouth every 8 (eight) hours as needed for nausea or vomiting.   04/30/2021   PROAIR RESPICLICK 108 (90 Base) MCG/ACT AEPB Inhale 2 puffs into the lungs every 6 (six) hours as needed (sob/wheezing).   unknown   Probiotic Product (ALIGN) 4 MG CAPS Take 4 mg by mouth daily.   04/30/2021   carvedilol (COREG) 12.5 MG tablet Take 1 tablet (12.5 mg total) by mouth 2 (two)  times daily with a meal. (Patient not taking: No sig reported) 60 tablet 0 Not Taking at 2100   hydrALAZINE (APRESOLINE) 25 MG tablet Take 1 tablet (25 mg total) by mouth every 6 (six) hours. (Patient not taking: Reported on 05/01/2021) 120 tablet 0 Not Taking    Assessment: 70 yo M on apixaban PTA for hx PE & DVT.  Pharmacy consulted to dose heparin for bridge therapy while apixaban on hold for SBO PTA apixban 5 mg po bid> last dose 8/17 at 2100.   05/05/2021: aPTT 79sec- remains therapeutic on heparin 1600 units/hr  AM Heparin level 0.75- trending down but still elevated as anticipated due to interaction with apixaban CBC: Hgb slightly low but stable; Plt WNL No overt bleeding or infusion related issues per RN   Goal of Therapy:  Heparin level 0.3-0.7 units/ml aPTT 66-102 seconds Monitor platelets by anticoagulation protocol: Yes   Plan:  Continue heparin infusion at 1600 units/hr  Daily CBC, aPTT & heparin level Will adjust heparin based on aPTT until apixaban cleared from system  Junita Push, PharmD, BCPS [phone] 05/05/2021, 12:30 AM

## 2021-05-05 NOTE — Progress Notes (Signed)
PHARMACIST - PHYSICIAN COMMUNICATION  DR:   Jonathon Bellows  CONCERNING: IV to Oral Route Change Policy  RECOMMENDATION: This patient is receiving pantoprazole by the intravenous route.  Based on criteria approved by the Pharmacy and Therapeutics Committee, the intravenous medication(s) is/are being converted to the equivalent oral dose form(s).   DESCRIPTION: These criteria include: The patient is eating (either orally or via tube) and/or has been taking other orally administered medications for a least 24 hours The patient has no evidence of active gastrointestinal bleeding or impaired GI absorption (gastrectomy, short bowel, patient on TNA or NPO).  If you have questions about this conversion, please contact the Pharmacy Department  []   [phone] )  ( [phone] []   [phone] )  [facility] []   [phone] )  [facility] []   [phone] )  [facility] [x]   [phone] )  [facility]   ( [phone], [facility] 05/05/2021 10:29 AM

## 2021-05-05 NOTE — Care Management Important Message (Signed)
Important Message  Patient Details IM Letter given to the Patient. Name: Randy Liu MRN: [id number] Date of Birth: 1951/05/16   Medicare Important Message Given:  Yes     Caren Macadam 05/05/2021, 1:50 PM

## 2021-05-05 NOTE — Progress Notes (Signed)
Handoff from Diona Browner at 19:12. Heparin drip infusing at 16 mL/hr, 1,600 units/hr.

## 2021-05-05 NOTE — Progress Notes (Signed)
   Subjective/Chief Complaint: Tolerating liquids without issue   Objective: Vital signs in last 24 hours: Temp:  [97.7 F (36.5 C)-97.9 F (36.6 C)] 97.9 F (36.6 C) (08/22 0549) Pulse Rate:  [67-76] 67 (08/22 0549) Resp:  [17-18] 18 (08/22 0549) BP: (128-148)/(65-79) 148/65 (08/22 0549) SpO2:  [95 %-96 %] 96 % (08/22 0549) Last BM Date: 05/04/21  Intake/Output from previous day: 08/21 0701 - 08/22 0700 In: 2481.8 [P.O.:300; I.V.:2181.8] Out: 1795 [Urine:1795] Intake/Output this shift: No intake/output data recorded.  General appearance: alert and cooperative Resp: clear to auscultation bilaterally Cardio: regular rate and rhythm GI: soft, nontender, nondistended  Lab Results:  Recent Labs    05/04/21 0335 05/05/21 0407  WBC 7.5 6.7  HGB 12.5* 12.7*  HCT 38.2* 39.0  PLT 197 188   BMET Recent Labs    05/03/21 0442  NA 139  K 3.4*  CL 101  CO2 25  GLUCOSE 193*  BUN 24*  CREATININE 1.34*  CALCIUM 9.4   PT/INR No results for input(s): LABPROT, INR in the last 72 hours. ABG No results for input(s): PHART, HCO3 in the last 72 hours.  Invalid input(s): PCO2, PO2  Studies/Results: DG Chest 2 View  Result Date: 05/04/2021 CLINICAL DATA:  Pneumonia, noticeable wheezing and shortness of breath EXAM: CHEST - 2 VIEW COMPARISON:  12/26/2020 FINDINGS: Low lung volumes. Infrahilar infiltrate or atelectasis, slightly improved on the left and increased on the right since prior study. Heart size and mediastinal contours are within normal limits. Aortic Atherosclerosis (ICD10-170.0). Blunting of left lateral costophrenic angle suggesting spots full left effusion. No pneumothorax. Visualized bones unremarkable. IMPRESSION: Low volumes. Infrahilar atelectasis or infiltrate, left greater than right. Electronically Signed   By: Corlis Leak M.D.   On: 05/04/2021 12:38   DG Abd 2 Views  Result Date: 05/03/2021 CLINICAL DATA:  Abdominal distention. Nasogastric tube.  Small-bowel obstruction. EXAM: ABDOMEN - 2 VIEW COMPARISON:  05/02/2021 FINDINGS: Nasogastric tube is in place, tip overlying the level of the stomach. There has been improvement in dilatation of small bowel loops. Persistent dilatation of LEFT abdominal small bowel loops persist. Gas is identified in nondilated loops of colon. IMPRESSION: Interval placement of nasogastric tube. Improvement in small bowel dilatation. Electronically Signed   By: Norva Pavlov M.D.   On: 05/03/2021 14:34    Anti-infectives: Anti-infectives (From admission, onward)    None       Assessment/Plan:  Resolving Sbo  S/P laparoscopic cholecystectomy, laparoscopic liver biopsy, primary umbilical  hernia repair 12/17/2020 Dr. Darnell Level  - Clinically resolved,  - PT/OT for mobilization - OK for discharge when cleared by medical service   FEN: advance as tolerated today to soft diet ID: no abx DVT: SCDs. Heparin gtt   Hx bacteremia - 12/23/2020 Hypertension Type 2 diabetes hyperglycemic.  Defer to primary medicine service. obstructive sleep apnea -CPAP Asthma/Hx acute respiratory failure 12/13/2020 Hx diastolic congestive heart failure  CKD stage III Wheelchair-bound Obesity with weight loss BMI 38.4>> 34.7 Hx PE on Eliquis GERD  LOS: 4 days    Randy Liu 05/05/2021

## 2021-05-05 NOTE — Progress Notes (Signed)
ANTICOAGULATION CONSULT NOTE  Pharmacy Consult for heparin >> apixaban Indication: pulmonary embolus - bridge therapy while apixaban on hold  Allergies  Allergen Reactions   Metoclopramide Itching    Loss of Balance; Urinary Incontinence   Nsaids     Other reaction(s): Other (See Comments) Renal Insufficiency Kidney issues    Amoxicillin-Pot Clavulanate Diarrhea and Nausea And Vomiting    Other reaction(s): Other (See Comments) Abdomen Pain Abdomen pain    Statins     Other reaction(s): Other (See Comments) Myalgias and Myositis myalgias    Allopurinol Rash   Empagliflozin     Other reaction(s): Other (See Comments) Fainting, weakness, lightheaded, falling   Tiotropium     Other reaction(s): Other (See Comments) Urinary retention    Patient Measurements: Height: 6\' 1"  (185.4 cm) Weight: 119.3 kg (263 lb) IBW/kg (Calculated) : 79.9 Heparin Dosing Weight: 105.7  Vital Signs: Temp: 97.9 F (36.6 C) (08/22 0549) Temp Source: Oral (08/22 0549) BP: 148/65 (08/22 0549) Pulse Rate: 67 (08/22 0549)  Labs: Recent Labs    05/03/21 0442 05/03/21 1605 05/04/21 0335 05/04/21 1355 05/04/21 2252 05/05/21 0407 05/05/21 0931  HGB 12.1*  --  12.5*  --   --  12.7* 12.4*  HCT 37.9*  --  38.2*  --   --  39.0 37.5*  PLT 222  --  197  --   --  188 189  APTT 128* 84* 56* 66* 79* 91*  --   HEPARINUNFRC >1.10* >1.10* 0.75*  --   --  0.70  --   CREATININE 1.34*  --   --   --   --   --  1.07     Estimated Creatinine Clearance: 84.5 mL/min (by C-G formula based on SCr of 1.07 mg/dL).  Medications: Pt taking apixaban 5 mg PO BID PTA -Last dose PTA: 8/17 @ 2100  Assessment: Pt is a 70 yoM who takes apixaban PTA for history of PE. Patient was admitted with SBO - anticoagulation was transitioned to IV [facility] pharmacy to dose. SBO has resolved, pharmacy consulted to transition anticoagulation back to apixaban.   05/05/2021: aPTT = 91 seconds, HL = 0.70 both therapeutic on heparin  infusion of 1600 units/hr CBC: Hgb slightly low but stable; Plt WNL  Goal of Therapy:  Heparin level 0.3-0.7 units/ml aPTT 66-102 seconds Monitor platelets by anticoagulation protocol: Yes   Plan:  Discontinue heparin drip Resume apixaban 5 mg PO BID CBC with AM labs tomorrow  Pharmacy to sign off, home anticoagulation resumed. Please re-consult if needed.   05/07/2021, PharmD 05/05/21 10:11 AM

## 2021-05-06 LAB — GLUCOSE, CAPILLARY
Glucose-Capillary: 160 mg/dL — ABNORMAL HIGH (ref 70–99)
Glucose-Capillary: 167 mg/dL — ABNORMAL HIGH (ref 70–99)
Glucose-Capillary: 181 mg/dL — ABNORMAL HIGH (ref 70–99)
Glucose-Capillary: 188 mg/dL — ABNORMAL HIGH (ref 70–99)
Glucose-Capillary: 201 mg/dL — ABNORMAL HIGH (ref 70–99)
Glucose-Capillary: 226 mg/dL — ABNORMAL HIGH (ref 70–99)

## 2021-05-06 LAB — RESP PANEL BY RT-PCR (FLU A&B, COVID) ARPGX2
Influenza A by PCR: NEGATIVE
Influenza B by PCR: NEGATIVE
SARS Coronavirus 2 by RT PCR: NEGATIVE

## 2021-05-06 LAB — CBC
HCT: 37 % — ABNORMAL LOW (ref 39.0–52.0)
Hemoglobin: 12.2 g/dL — ABNORMAL LOW (ref 13.0–17.0)
MCH: 31.7 pg (ref 26.0–34.0)
MCHC: 33 g/dL (ref 30.0–36.0)
MCV: 96.1 fL (ref 80.0–100.0)
Platelets: 185 10*3/uL (ref 150–400)
RBC: 3.85 MIL/uL — ABNORMAL LOW (ref 4.22–5.81)
RDW: 14.5 % (ref 11.5–15.5)
WBC: 7.8 10*3/uL (ref 4.0–10.5)
nRBC: 0 % (ref 0.0–0.2)

## 2021-05-06 LAB — OCCULT BLOOD X 1 CARD TO LAB, STOOL: Fecal Occult Bld: NEGATIVE

## 2021-05-06 LAB — HEMOGLOBIN AND HEMATOCRIT, BLOOD
HCT: 38.8 % — ABNORMAL LOW (ref 39.0–52.0)
Hemoglobin: 12.3 g/dL — ABNORMAL LOW (ref 13.0–17.0)

## 2021-05-06 MED ORDER — ACETAMINOPHEN 325 MG PO TABS
650.0000 mg | ORAL_TABLET | Freq: Four times a day (QID) | ORAL | Status: DC | PRN
  Administered 2021-05-06: 650 mg via ORAL
  Filled 2021-05-06 (×2): qty 2

## 2021-05-06 MED ORDER — PEG 3350-KCL-NA BICARB-NACL 420 G PO SOLR
4000.0000 mL | Freq: Once | ORAL | Status: AC
  Administered 2021-05-07: 4000 mL via ORAL

## 2021-05-06 MED ORDER — BISACODYL 10 MG RE SUPP: 10.0000 mg | Freq: Every day | RECTAL | 0 refills | Status: DC | PRN

## 2021-05-06 NOTE — Care Plan (Signed)
Discharge held due to moderate amount of blood in bed sheet, normal colored stool. On exam [email or web address] at bedside Hemodynamically stable. last colonoscopy many years ago ,declined one in 2018. GI consulted.fobt ordered. H/h later today.

## 2021-05-06 NOTE — Progress Notes (Signed)
Alerted by RN of cancelled dc due to new medical concerns. Have cancelled [facility]AR and alerted [facility].  Facility to hold bed for now.  Continue to follow.  Josh Nicolosi, LCSW

## 2021-05-06 NOTE — Progress Notes (Signed)
Went to clean patient before he left for the facility. Was found to have had a stool, which was brown, but there was a big blood stain on the sheet and pad. Examined the buttock area, only found a small open area, that according to his wife was there before he came in. MD was notified, d/c was cancelled.

## 2021-05-06 NOTE — Consult Note (Signed)
Reason for Consult: Hematochezia Referring Physician: [facility]  Bjorn Pippin HPI: This is a 70 year old male with a PMH of SBO secondary to adhesions, s/p lap chole 12/2020, s/p ERCP 12/2020, gasroparesis, Stage III CKD, DM, OSA, HTN, hyperlipidemia, and history of PE on Eliquis admitted for an SBO.  He was treated conservatively with an NG tube and he progressed to the point that he was to be discharged today.  Just prior to discharge the patient was noted to have a blood stain on his bed sheet and there was normal colored stool. Past Medical History:  Diagnosis Date   Asthma    CHF (congestive heart failure) (HCC)    Diabetes mellitus without complication (HCC)    Hypertension     Past Surgical History:  Procedure Laterality Date   BIOPSY  12/14/2020   Procedure: BIOPSY;  Surgeon: Lynann Bologna, MD;  Location: [facility];  Service: Endoscopy;;   CHOLECYSTECTOMY N/A 12/17/2020   Procedure: LAPAROSCOPIC CHOLECYSTECTOMY, LIVER BIOPSY, PRIMARY UMBILICAL HERNIA REPAIR;  Surgeon: Darnell Level, MD;  Location: [facility];  Service: General;  Laterality: N/A;   ENDOSCOPIC RETROGRADE CHOLANGIOPANCREATOGRAPHY (ERCP) WITH PROPOFOL N/A 12/14/2020   Procedure: ENDOSCOPIC RETROGRADE CHOLANGIOPANCREATOGRAPHY (ERCP) WITH PROPOFOL;  Surgeon: Lynann Bologna, MD;  Location: [facility];  Service: Endoscopy;  Laterality: N/A;   REMOVAL OF STONES  12/14/2020   Procedure: REMOVAL OF STONES;  Surgeon: Lynann Bologna, MD;  Location: [facility];  Service: Endoscopy;;   SPHINCTEROTOMY  12/14/2020   Procedure: Dennison Mascot;  Surgeon: Lynann Bologna, MD;  Location: [facility];  Service: Endoscopy;;    History reviewed. No pertinent family history.  Social History:  reports that he has quit smoking. He has never used smokeless tobacco. He reports that he does not currently use alcohol. He reports that he does not use drugs.  Allergies:  Allergies  Allergen Reactions   Metoclopramide Itching    Loss of  Balance; Urinary Incontinence   Nsaids     Other reaction(s): Other (See Comments) Renal Insufficiency Kidney issues    Amoxicillin-Pot Clavulanate Diarrhea and Nausea And Vomiting    Other reaction(s): Other (See Comments) Abdomen Pain Abdomen pain    Statins     Other reaction(s): Other (See Comments) Myalgias and Myositis myalgias    Allopurinol Rash   Empagliflozin     Other reaction(s): Other (See Comments) Fainting, weakness, lightheaded, falling   Tiotropium     Other reaction(s): Other (See Comments) Urinary retention    Medications: Scheduled:  apixaban  5 mg Oral BID   bisacodyl  10 mg Rectal Daily   carvedilol  6.25 mg Oral BID WC   insulin aspart  0-6 Units Subcutaneous Q4H   insulin aspart protamine- aspart  30 Units Subcutaneous BID WC   lidocaine  1 patch Transdermal Q24H   lip balm  1 application Topical BID   pantoprazole  40 mg Oral BID   Continuous:  dextrose 100 mL/hr at 05/06/21 0350   methocarbamol (ROBAXIN) IV     ondansetron (ZOFRAN) IV      Results for orders placed or performed during the hospital encounter of 05/01/21 (from the past 24 hour(s))  Glucose, capillary     Status: Abnormal   Collection Time: 05/05/21  4:08 PM  Result Value Ref Range   Glucose-Capillary 162 (H) 70 - 99 mg/dL  Glucose, capillary     Status: Abnormal   Collection Time: 05/05/21  8:34 PM  Result Value Ref Range   Glucose-Capillary 178 (H) 70 -  99 mg/dL  Glucose, capillary     Status: Abnormal   Collection Time: 05/06/21 12:06 AM  Result Value Ref Range   Glucose-Capillary 181 (H) 70 - 99 mg/dL  Glucose, capillary     Status: Abnormal   Collection Time: 05/06/21  4:18 AM  Result Value Ref Range   Glucose-Capillary 188 (H) 70 - 99 mg/dL  CBC     Status: Abnormal   Collection Time: 05/06/21  4:19 AM  Result Value Ref Range   WBC 7.8 4.0 - 10.5 K/uL   RBC 3.85 (L) 4.22 - 5.81 MIL/uL   Hemoglobin 12.2 (L) 13.0 - 17.0 g/dL   HCT 54.0 (L) 98.1 - 19.1 %   MCV  96.1 80.0 - 100.0 fL   MCH 31.7 26.0 - 34.0 pg   MCHC 33.0 30.0 - 36.0 g/dL   RDW 47.8 29.5 - 62.1 %   Platelets 185 150 - 400 K/uL   nRBC 0.0 0.0 - 0.2 %  Glucose, capillary     Status: Abnormal   Collection Time: 05/06/21  7:32 AM  Result Value Ref Range   Glucose-Capillary 226 (H) 70 - 99 mg/dL  Resp Panel by RT-PCR (Flu A&B, Covid) Nasopharyngeal Swab     Status: None   Collection Time: 05/06/21 10:44 AM   Specimen: Nasopharyngeal Swab; Nasopharyngeal(NP) swabs in vial transport medium  Result Value Ref Range   SARS Coronavirus 2 by RT PCR NEGATIVE NEGATIVE   Influenza A by PCR NEGATIVE NEGATIVE   Influenza B by PCR NEGATIVE NEGATIVE  Glucose, capillary     Status: Abnormal   Collection Time: 05/06/21 12:01 PM  Result Value Ref Range   Glucose-Capillary 201 (H) 70 - 99 mg/dL     No results found.  ROS:  As stated above in the HPI otherwise negative.  Blood pressure 121/62, pulse 67, temperature 98.3 F (36.8 C), temperature source Oral, resp. rate 19, height 6\' 1"  (1.854 m), weight 119.3 kg, SpO2 94 %.    PE: Gen: NAD, Alert and Oriented HEENT:  [place]/AT, EOMI Neck: Supple, no LAD Lungs: CTA Bilaterally CV: RRR without M/G/R ABD: Soft, NTND, +BS Ext: No C/C/E  Assessment/Plan: 1) Hematochezia. 2) S/p SBO - resolved with NG tube. 3) History of gastroparesis.   The current findings suggest a hemorrhoidal source versus the left perianal wound.  This would is not new and the patient's wife does report that he did bleed from this area in the past, however, this current bleeding is much greater.  His HGB is stable.  The rectal examination was negative for any palpable abnormalities and the stool was brown.  His last colonoscopy was many years ago.  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  Plan: 1) Colonoscopy this Thursday.  Zalea Pete D 05/06/2021, 2:16 PM

## 2021-05-06 NOTE — Discharge Summary (Addendum)
Physician Discharge Summary  Aadarsh Cozort ZOX:[id number] DOB: 02-14-1951 DOA: 05/01/2021  PCP: Felton Clinton, MD  Admit date: 05/01/2021 Discharge date: 05/09/2021  Admitted From: home Disposition:  SNF [facility]  Recommendations for Outpatient Follow-up:  Follow up with PCP in 1-2 weeks Please obtain BMP/CBC in one week Please follow up on the following pending results:  Home Health:yes  Equipment/Devices: none  Discharge Condition: Stable Code Status:   Code Status: Full Code Diet recommendation:  Diet Order             Diet regular Room service appropriate? Yes; Fluid consistency: Thin  Diet effective now                    Brief/Interim Summary: 70 year old male with history of morbid obesity BMI 34, nonambulatory since 12/01/2020 WC bound type 2 diabetes  on long-term insulin NovoLog Mix 70/30 12 units bid, with gastroparesis on reglan, history of CKD stage IIIA baseline creatinine ~1.3, PE on on Eliquis anticoagulation since 2020, thyroid nodules, OSA/restrictive lung disease on CPAP, hypertension on amlodipine, carvedilol, GERD and PPI, HLD on fenofibrate admitted with a small bowel obstruction in the setting of prior small bowel obstruction in 2020 and recent surgery on March/2022. Patient followed by general surgery managed conservatively with NG decompression. NGT has been removed.  Diet was slowly advanced.  At this time is tolerating diet and cleared for discharge by general surgery and he will follow-up outpatient disposition.  Underwent colonoscopy see the report below GI advised to hold Eliquis for an additional 3 days can resume on 8/28   Discharge Diagnoses:   Hematochezia 8/23-seen by GI.  Hemoglobin is stable underwent colonoscopy found to have " One 15 mm polyp in the ascending colon, removed                            with a hot snare. Resected and retrieved.                           - Two 2 to 3 mm polyps in the descending colon and                             in the ascending colon, removed with a cold snare.                            Complete resection. Partial retrieval. Recommendation:           - Return patient to hospital ward for ongoing care.                           - Resume regular diet.                           - Continue present medications.                           - Await pathology results.                           - Repeat colonoscopy in 3 years for surveillance.                           -  Hold Eliquis for an additional 3 days" Okay for discharge. Recent Labs  Lab 05/06/21 0419 05/06/21 1901 05/07/21 0846 05/08/21 0419 05/09/21 0431  HGB 12.2* 12.3* 11.6* 11.6* 11.5*  HCT 37.0* 38.8* 35.0* 35.3* 35.6*     ZOX:WRUEAVW conservatively.  Resolved.  Tolerating diet.  Surgery signed off.    Type 2 diabetes  on long-term insulin NovoLog Mix 70/30 with gastroparesis on reglan.  HbA1c stable 7.9 .CBG well controlled-on mix insulin and sliding scale insulin-resume on discharge. Recent Labs  Lab 05/08/21 1619 05/08/21 1950 05/09/21 0034 05/09/21 0430 05/09/21 0809  GLUCAP 123* 130* 146* 138* 162*     Functional quadriplegia/nonambulatory since 12/01/2020 WC bound.  Continue PT OT supportive care.  He lives with his wife at home  CHF with preserved EF Hypertension on amlodipine/carvedilol at home Mild lower leg edema present.  Stable.  Blood pressure remains a stable on lower dose of Coreg.  He has been off amlodipine.  Follow-up with PCP to monitor to see if he needs to go back on amlodipine and if Coreg dose needs to be increased.    CKD stage IIIA baseline creatinine ~1.3: Renal function stable..   Recent Labs  Lab 05/03/21 0442 05/05/21 0931 05/08/21 0419 05/09/21 0431  BUN 24* CREATININE 1.34* 1.07 1.05 1.01    Mild hypokalemia replacement ordered.   Prior provoked DVT/PE on on Eliquis anticoagulation since 2020: Per GI can resume Eliquis after 3 days  OSA/restrictive lung disease  2/2 ? Covid:on CPAP, Respiratory status is stable.  Continue supportive measures  Gout resumecolchicine.  GERD continue PPI  HLD continue his fenofibrateat.  Morbid obesity BMI 34, will benefit with weight loss, healthy lifestyle and PCP follow-up.   Consults: General surgery GI alert  Subjective: Is oriented no acute events overnight.  No rectal bleeding.  Discharge Exam: Vitals:   05/08/21 2120 05/09/21 0604  BP: (!) 141/66 (!) 148/71  Pulse: 69 72  Resp: 18 18  Temp: 99.3 F (37.4 C) 98.7 F (37.1 C)  SpO2: 96% 94%   General: Pt is alert, awake, not in acute distress Cardiovascular: RRR, S1/S2 +, no rubs, no gallops Respiratory: CTA bilaterally, no wheezing, no rhonchi Abdominal: Soft, NT, ND, bowel sounds + Extremities: no edema, no cyanosis  Discharge Instructions  Discharge Instructions     Discharge instructions   Complete by: As directed    Please call call MD or return to ER for similar or worsening recurring problem that brought you to hospital or if any fever,nausea/vomiting,abdominal pain, uncontrolled pain, chest pain,  shortness of breath or any other alarming symptoms.  Please follow-up your doctor as instructed in a week time and call the office for appointment.  Please avoid alcohol, smoking, or any other illicit substance and maintain healthy habits including taking your regular medications as prescribed.  You were cared for by a hospitalist during your hospital stay. If you have any questions about your discharge medications or the care you received while you were in the hospital after you are discharged, you can call the unit and ask to speak with the hospitalist on call if the hospitalist that took care of you is not available.  Once you are discharged, your primary care physician will handle any further medical issues. Please note that NO REFILLS for any discharge medications will be authorized once you are discharged, as it is imperative that  you return to your primary care physician (or establish a relationship with a primary care  physician if you do not have one) for your aftercare needs so that they can reassess your need for medications and monitor your lab values   Increase activity slowly   Complete by: As directed       Allergies as of 05/09/2021       Reactions   Metoclopramide Itching   Loss of Balance; Urinary Incontinence   Nsaids    Other reaction(s): Other (See Comments) Renal Insufficiency Kidney issues   Amoxicillin-pot Clavulanate Diarrhea, Nausea And Vomiting   Other reaction(s): Other (See Comments) Abdomen Pain Abdomen pain   Statins    Other reaction(s): Other (See Comments) Myalgias and Myositis myalgias   Allopurinol Rash   Empagliflozin    Other reaction(s): Other (See Comments) Fainting, weakness, lightheaded, falling   Tiotropium    Other reaction(s): Other (See Comments) Urinary retention        Medication List     STOP taking these medications    amLODipine 10 MG tablet Commonly known as: NORVASC       TAKE these medications    Align 4 MG Caps Take 4 mg by mouth daily.   Alpha-Lipoic Acid 300 MG Tabs Take 600 mg by mouth daily.   benzonatate 200 MG capsule Commonly known as: TESSALON Take 200 mg by mouth every 8 (eight) hours as needed for cough.   bisacodyl 10 MG suppository Commonly known as: DULCOLAX Place 1 suppository (10 mg total) rectally daily as needed for up to 10 doses for moderate constipation.   carvedilol 6.25 MG tablet Commonly known as: COREG Take 1 tablet (6.25 mg total) by mouth 2 (two) times daily with a meal. What changed:  medication strength how much to take when to take this   Cholecalciferol 25 MCG (1000 UT) capsule Take 4,000 Units by mouth in the morning and at bedtime.   colchicine-probenecid 0.5-500 MG tablet Take 1 tablet by mouth daily.   cyanocobalamin 1000 MCG/ML injection Commonly known as: (VITAMIN B-12) Inject 1,000  mcg into the skin every 30 (thirty) days. 27th of month   Eliquis 5 MG Tabs tablet Generic drug: apixaban Take 1 tablet (5 mg total) by mouth 2 (two) times daily. Resume on 05/11/21 Start taking on: May 11, 2021 What changed:  additional instructions These instructions start on May 11, 2021. If you are unsure what to do until then, ask your doctor or other care provider.   fenofibrate 160 MG tablet Take 160 mg by mouth daily.   lansoprazole 30 MG capsule Commonly known as: PREVACID Take 30 mg by mouth in the morning and at bedtime.   latanoprost 0.005 % ophthalmic solution Commonly known as: XALATAN Place 1 drop into the left eye at bedtime.   magnesium oxide 400 MG tablet Commonly known as: MAG-OX Take 400 mg by mouth 2 (two) times daily.   meclizine 25 MG tablet Commonly known as: ANTIVERT Take 25 mg by mouth 3 (three) times daily as needed for nausea.   metoCLOPramide 5 MG tablet Commonly known as: REGLAN Take 2.5 mg by mouth at bedtime. Take 1/2 tablet (2.5 mg) at bedtime   montelukast 10 MG tablet Commonly known as: SINGULAIR Take 10 mg by mouth at bedtime.   NovoLOG Mix 70/30 FlexPen (70-30) 100 UNIT/ML FlexPen Generic drug: insulin aspart protamine - aspart Inject 30 Units into the skin 2 (two) times daily with a meal. What changed:  how much to take additional instructions   ondansetron 8 MG tablet Commonly known as: ZOFRAN Take 8  mg by mouth every 8 (eight) hours as needed for nausea or vomiting.   ProAir RespiClick 108 (90 Base) MCG/ACT Aepb Generic drug: Albuterol Sulfate Inhale 2 puffs into the lungs every 6 (six) hours as needed (sob/wheezing).         Contact information for follow-up providers     Felton ClintonWeston, Eileen, MD Follow up in 1 week(s).   Specialty: Internal Medicine Contact information: [address] [place] [id number] [phone]         [facility] Follow up.   Contact  information: [address] [place] [phone]        [facility]   Contact information: [address] [place]  [place] [id number] [phone]              Contact information for after-discharge care     Destination     [facility] .   Service: Skilled Nursing Contact information: [address] [place] [id number] [phone]                    Allergies  Allergen Reactions   Metoclopramide Itching    Loss of Balance; Urinary Incontinence   Nsaids     Other reaction(s): Other (See Comments) Renal Insufficiency Kidney issues    Amoxicillin-Pot Clavulanate Diarrhea and Nausea And Vomiting    Other reaction(s): Other (See Comments) Abdomen Pain Abdomen pain    Statins     Other reaction(s): Other (See Comments) Myalgias and Myositis myalgias    Allopurinol Rash   Empagliflozin     Other reaction(s): Other (See Comments) Fainting, weakness, lightheaded, falling   Tiotropium     Other reaction(s): Other (See Comments) Urinary retention    The results of significant diagnostics from this hospitalization (including imaging, microbiology, ancillary and laboratory) are listed below for reference.    Microbiology: Recent Results (from the past 240 hour(s))  Resp Panel by RT-PCR (Flu A&B, Covid) Nasopharyngeal Swab     Status: None   Collection Time: 05/01/21  2:35 PM   Specimen: Nasopharyngeal Swab; Nasopharyngeal(NP) swabs in vial transport medium  Result Value Ref Range Status   SARS Coronavirus 2 by RT PCR NEGATIVE NEGATIVE Final    Comment: (NOTE) SARS-CoV-2 target nucleic acids are NOT DETECTED.  The SARS-CoV-2 RNA is generally detectable in upper respiratory specimens during the acute phase of infection. The lowest concentration of SARS-CoV-2 viral copies this assay can detect is 138 copies/mL. A negative  result does not preclude SARS-Cov-2 infection and should not be used as the sole basis for treatment or other patient management decisions. A negative result may occur with  improper specimen collection/handling, submission of specimen other than nasopharyngeal swab, presence of viral mutation(s) within the areas targeted by this assay, and inadequate number of viral copies(<138 copies/mL). A negative result must be combined with clinical observations, patient history, and epidemiological information. The expected result is Negative.  Fact Sheet for Patients:  BloggerCourse.comhttps://www.fda.gov/media/152166/download  Fact Sheet for Healthcare Providers:  SeriousBroker.ithttps://www.fda.gov/media/152162/download  This test is no t yet approved or cleared by the [place] FDA and  has been authorized for detection and/or diagnosis of SARS-CoV-2 by FDA under an Emergency Use Authorization (EUA). This EUA will remain  in effect (meaning this test can be used) for the duration of the COVID-19 declaration under Section 564(b)(1) of the Act, 21 U.S.C.section 360bbb-3(b)(1), unless the  authorization is terminated  or revoked sooner.       Influenza A by PCR NEGATIVE NEGATIVE Final   Influenza B by PCR NEGATIVE NEGATIVE Final    Comment: (NOTE) The Xpert Xpress SARS-CoV-2/FLU/RSV plus assay is intended as an aid in the diagnosis of influenza from Nasopharyngeal swab specimens and should not be used as a sole basis for treatment. Nasal washings and aspirates are unacceptable for Xpert Xpress SARS-CoV-2/FLU/RSV testing.  Fact Sheet for Patients: BloggerCourse.com  Fact Sheet for Healthcare Providers: SeriousBroker.it  This test is not yet approved or cleared by the [place] FDA and has been authorized for detection and/or diagnosis of SARS-CoV-2 by FDA under an Emergency Use Authorization (EUA). This EUA will remain in effect (meaning this test can be used)  for the duration of the COVID-19 declaration under Section 564(b)(1) of the Act, 21 U.S.C. section 360bbb-3(b)(1), unless the authorization is terminated or revoked.  Performed at [facility], [address] [place], [place] [address]   Resp Panel by RT-PCR (Flu A&B, Covid) Nasopharyngeal Swab     Status: None   Collection Time: 05/06/21 10:44 AM   Specimen: Nasopharyngeal Swab; Nasopharyngeal(NP) swabs in vial transport medium  Result Value Ref Range Status   SARS Coronavirus 2 by RT PCR NEGATIVE NEGATIVE Final    Comment: (NOTE) SARS-CoV-2 target nucleic acids are NOT DETECTED.  The SARS-CoV-2 RNA is generally detectable in upper respiratory specimens during the acute phase of infection. The lowest concentration of SARS-CoV-2 viral copies this assay can detect is 138 copies/mL. A negative result does not preclude SARS-Cov-2 infection and should not be used as the sole basis for treatment or other patient management decisions. A negative result may occur with  improper specimen collection/handling, submission of specimen other than nasopharyngeal swab, presence of viral mutation(s) within the areas targeted by this assay, and inadequate number of viral copies(<138 copies/mL). A negative result must be combined with clinical observations, patient history, and epidemiological information. The expected result is Negative.  Fact Sheet for Patients:  BloggerCourse.com  Fact Sheet for Healthcare Providers:  SeriousBroker.it  This test is no t yet approved or cleared by the [place] FDA and  has been authorized for detection and/or diagnosis of SARS-CoV-2 by FDA under an Emergency Use Authorization (EUA). This EUA will remain  in effect (meaning this test can be used) for the duration of the COVID-19 declaration under Section 564(b)(1) of the Act, 21 U.S.C.section 360bbb-3(b)(1), unless the authorization is  terminated  or revoked sooner.       Influenza A by PCR NEGATIVE NEGATIVE Final   Influenza B by PCR NEGATIVE NEGATIVE Final    Comment: (NOTE) The Xpert Xpress SARS-CoV-2/FLU/RSV plus assay is intended as an aid in the diagnosis of influenza from Nasopharyngeal swab specimens and should not be used as a sole basis for treatment. Nasal washings and aspirates are unacceptable for Xpert Xpress SARS-CoV-2/FLU/RSV testing.  Fact Sheet for Patients: BloggerCourse.com  Fact Sheet for Healthcare Providers: SeriousBroker.it  This test is not yet approved or cleared by the [place] FDA and has been authorized for detection and/or diagnosis of SARS-CoV-2 by FDA under an Emergency Use Authorization (EUA). This EUA will remain in effect (meaning this test can be used) for the duration of the COVID-19 declaration under Section 564(b)(1) of the Act, 21 U.S.C. section 360bbb-3(b)(1), unless the authorization is terminated or revoked.  Performed at [facility], [address] [place], [place] [address]  Procedures/Studies: DG Chest 2 View  Result Date: 05/04/2021 CLINICAL DATA:  Pneumonia, noticeable wheezing and shortness of breath EXAM: CHEST - 2 VIEW COMPARISON:  12/26/2020 FINDINGS: Low lung volumes. Infrahilar infiltrate or atelectasis, slightly improved on the left and increased on the right since prior study. Heart size and mediastinal contours are within normal limits. Aortic Atherosclerosis (ICD10-170.0). Blunting of left lateral costophrenic angle suggesting spots full left effusion. No pneumothorax. Visualized bones unremarkable. IMPRESSION: Low volumes. Infrahilar atelectasis or infiltrate, left greater than right. Electronically Signed   By: Corlis Leak M.D.   On: 05/04/2021 12:38   CT ABDOMEN PELVIS W CONTRAST  Result Date: 05/01/2021 CLINICAL DATA:  Abdominal pain EXAM: CT ABDOMEN AND PELVIS WITH  CONTRAST TECHNIQUE: Multidetector CT imaging of the abdomen and pelvis was performed using the standard protocol following bolus administration of intravenous contrast. CONTRAST:  OMNIPAQUE IOHEXOL 300 MG/ML  SOLN COMPARISON:  CT abdomen pelvis dated December 12, 2020 FINDINGS: Lower chest: Partially visualized small pericardial effusion which is similar to prior CT. Hepatobiliary: No focal liver abnormality is seen. Status post cholecystectomy. No biliary dilatation. Pancreas: Unremarkable. No pancreatic ductal dilatation or surrounding inflammatory changes. Spleen: Normal in size without focal abnormality. Adrenals/Urinary Tract: Normal bilateral adrenal glands. Bilateral exophytic renal cysts. No hydronephrosis. Bladder is unremarkable. Stomach/Bowel: Stomach is unremarkable. Multiple dilated loops of small bowel with gradual transition point in lower mid abdomen seen on series 3, image 176. And decompressed loops of distal small bowel. No evidence of pneumatosis, free air, free fluid or bowel wall hypoenhancement. Vascular/Lymphatic: Aortic atherosclerosis. No enlarged abdominal or pelvic lymph nodes. Reproductive: Prostate is unremarkable. Other: Small fat containing left inguinal hernia. No abdominopelvic ascites. Musculoskeletal: No acute or significant osseous findings. IMPRESSION: Multiple dilated loops of small bowel with gradual transition point in the mid lower abdomen and decompressed loops of distal small bowel, findings are compatible small bowel obstruction. No evidence of pneumatosis, free air, free fluid or bowel wall hypoenhancement. Electronically Signed   By: Allegra Lai M.D.   On: 05/01/2021 14:15   DG Abd 2 Views  Result Date: 05/03/2021 CLINICAL DATA:  Abdominal distention. Nasogastric tube. Small-bowel obstruction. EXAM: ABDOMEN - 2 VIEW COMPARISON:  05/02/2021 FINDINGS: Nasogastric tube is in place, tip overlying the level of the stomach. There has been improvement in dilatation  of small bowel loops. Persistent dilatation of LEFT abdominal small bowel loops persist. Gas is identified in nondilated loops of colon. IMPRESSION: Interval placement of nasogastric tube. Improvement in small bowel dilatation. Electronically Signed   By: Norva Pavlov M.D.   On: 05/03/2021 14:34   DG Abd Portable 1V-Small Bowel Obstruction Protocol-24 hr delay  Result Date: 05/02/2021 CLINICAL DATA:  Small-bowel obstruction EXAM: PORTABLE ABDOMEN - 1 VIEW COMPARISON:  05/02/2020 FINDINGS: Three supine frontal views of the abdomen and pelvis are obtained, 24 hours after administration of oral contrast. Enteric catheter again identified in the region of the gastric fundus. There is persistent gaseous distention of the small bowel measuring up to 5.5 cm in diameter. I do not see any oral contrast on this examination. Minimal gas within the colon unchanged. Excreted contrast within the urinary bladder again noted. IMPRESSION: 1. Persistent gaseous distention of the small bowel consistent with small-bowel obstruction. No evidence of oral contrast on this exam. Electronically Signed   By: Sharlet Salina M.D.   On: 05/02/2021 20:21   DG Abd Portable 1V-Small Bowel Obstruction Protocol-initial, 8 hr delay  Result Date: 05/02/2021 CLINICAL DATA:  Small-bowel protocol.  8 hour post contrast film. EXAM: PORTABLE ABDOMEN - 1 VIEW COMPARISON:  Abdominal radiograph dated 05/01/2021 and CT dated 05/01/2021. FINDINGS: Enteric tube with tip in the left upper abdomen likely in the body of the stomach. Persistent dilatation of air-filled loops of small bowel in the lower abdomen measuring up to approximately 6 cm in caliber. No free air identified. Right upper quadrant cholecystectomy clips. Excreted contrast noted in the bladder. The soft tissues are grossly unremarkable. IMPRESSION: Persistent dilatation of air-filled loops of small bowel in the lower abdomen. Electronically Signed   By: Elgie Collard M.D.   On:  05/02/2021 02:13   DG Abd Portable 1V  Result Date: 05/01/2021 CLINICAL DATA:  Encounter for nasogastric tube placement. EXAM: PORTABLE ABDOMEN - 1 VIEW COMPARISON:  None. FINDINGS: Two consecutive radiographs were obtained with the final radiograph demonstrating an enteric tube coursing below the hemidiaphragm with tip and side port overlying the expected region of the gastric lumen. The bowel gas pattern is normal. No radio-opaque calculi or other significant radiographic abnormality are seen. IMPRESSION: Enteric tube noted coursing below the hemidiaphragm with tip and side port overlying the expected region of the gastric lumen. Electronically Signed   By: Tish Frederickson M.D.   On: 05/01/2021 15:42    Labs: BNP (last 3 results) No results for input(s): BNP in the last 8760 hours. Basic Metabolic Panel: Recent Labs  Lab 05/03/21 0442 05/05/21 0931 05/08/21 0419 05/09/21 0431  NA 139 138 133* 136  K 3.4* 3.2* 3.2* 3.8  CL 101 103 102 101  CO2 GLUCOSE 193* 250* 174* 159*  BUN 24* CREATININE 1.34* 1.07 1.05 1.01  CALCIUM 9.4 9.3 9.0 9.1   Liver Function Tests: No results for input(s): AST, ALT, ALKPHOS, BILITOT, PROT, ALBUMIN in the last 168 hours.  No results for input(s): LIPASE, AMYLASE in the last 168 hours.  No results for input(s): AMMONIA in the last 168 hours. CBC: Recent Labs  Lab 05/05/21 0931 05/06/21 0419 05/06/21 1901 05/07/21 0846 05/08/21 0419 05/09/21 0431  WBC 7.1 7.8  --  6.8 6.8 7.5  HGB 12.4* 12.2* 12.3* 11.6* 11.6* 11.5*  HCT 37.5* 37.0* 38.8* 35.0* 35.3* 35.6*  MCV 94.0 96.1  --  94.1 94.9 95.2  PLT 189 185  --  196 206 231   Cardiac Enzymes: No results for input(s): CKTOTAL, CKMB, CKMBINDEX, TROPONINI in the last 168 hours. BNP: Invalid input(s): POCBNP CBG: Recent Labs  Lab 05/08/21 1619 05/08/21 1950 05/09/21 0034 05/09/21 0430 05/09/21 0809  GLUCAP 123* 130* 146* 138* 162*   D-Dimer No results for input(s):  DDIMER in the last 72 hours. Hgb A1c No results for input(s): HGBA1C in the last 72 hours. Lipid Profile No results for input(s): CHOL, HDL, LDLCALC, TRIG, CHOLHDL, LDLDIRECT in the last 72 hours. Thyroid function studies No results for input(s): TSH, T4TOTAL, T3FREE, THYROIDAB in the last 72 hours.  Invalid input(s): FREET3 Anemia work up No results for input(s): VITAMINB12, FOLATE, FERRITIN, TIBC, IRON, RETICCTPCT in the last 72 hours. Urinalysis    Component Value Date/Time   COLORURINE AMBER (A) 12/26/2020 1602   APPEARANCEUR CLEAR 12/26/2020 1602   LABSPEC 1.017 12/26/2020 1602   PHURINE 5.0 12/26/2020 1602   GLUCOSEU 50 (A) 12/26/2020 1602   HGBUR NEGATIVE 12/26/2020 1602   BILIRUBINUR NEGATIVE 12/26/2020 1602   KETONESUR NEGATIVE 12/26/2020 1602   PROTEINUR NEGATIVE 12/26/2020 1602   NITRITE NEGATIVE 12/26/2020 1602   LEUKOCYTESUR NEGATIVE  12/26/2020 1602   Sepsis Labs Invalid input(s): PROCALCITONIN,  WBC,  LACTICIDVEN Microbiology Recent Results (from the past 240 hour(s))  Resp Panel by RT-PCR (Flu A&B, Covid) Nasopharyngeal Swab     Status: None   Collection Time: 05/01/21  2:35 PM   Specimen: Nasopharyngeal Swab; Nasopharyngeal(NP) swabs in vial transport medium  Result Value Ref Range Status   SARS Coronavirus 2 by RT PCR NEGATIVE NEGATIVE Final    Comment: (NOTE) SARS-CoV-2 target nucleic acids are NOT DETECTED.  The SARS-CoV-2 RNA is generally detectable in upper respiratory specimens during the acute phase of infection. The lowest concentration of SARS-CoV-2 viral copies this assay can detect is 138 copies/mL. A negative result does not preclude SARS-Cov-2 infection and should not be used as the sole basis for treatment or other patient management decisions. A negative result may occur with  improper specimen collection/handling, submission of specimen other than nasopharyngeal swab, presence of viral mutation(s) within the areas targeted by this assay,  and inadequate number of viral copies(<138 copies/mL). A negative result must be combined with clinical observations, patient history, and epidemiological information. The expected result is Negative.  Fact Sheet for Patients:  BloggerCourse.com  Fact Sheet for Healthcare Providers:  SeriousBroker.it  This test is no t yet approved or cleared by the [place] FDA and  has been authorized for detection and/or diagnosis of SARS-CoV-2 by FDA under an Emergency Use Authorization (EUA). This EUA will remain  in effect (meaning this test can be used) for the duration of the COVID-19 declaration under Section 564(b)(1) of the Act, 21 U.S.C.section 360bbb-3(b)(1), unless the authorization is terminated  or revoked sooner.       Influenza A by PCR NEGATIVE NEGATIVE Final   Influenza B by PCR NEGATIVE NEGATIVE Final    Comment: (NOTE) The Xpert Xpress SARS-CoV-2/FLU/RSV plus assay is intended as an aid in the diagnosis of influenza from Nasopharyngeal swab specimens and should not be used as a sole basis for treatment. Nasal washings and aspirates are unacceptable for Xpert Xpress SARS-CoV-2/FLU/RSV testing.  Fact Sheet for Patients: BloggerCourse.com  Fact Sheet for Healthcare Providers: SeriousBroker.it  This test is not yet approved or cleared by the [place] FDA and has been authorized for detection and/or diagnosis of SARS-CoV-2 by FDA under an Emergency Use Authorization (EUA). This EUA will remain in effect (meaning this test can be used) for the duration of the COVID-19 declaration under Section 564(b)(1) of the Act, 21 U.S.C. section 360bbb-3(b)(1), unless the authorization is terminated or revoked.  Performed at [facility], [address] [place], [place] [address]   Resp Panel by RT-PCR (Flu A&B, Covid) Nasopharyngeal Swab     Status: None    Collection Time: 05/06/21 10:44 AM   Specimen: Nasopharyngeal Swab; Nasopharyngeal(NP) swabs in vial transport medium  Result Value Ref Range Status   SARS Coronavirus 2 by RT PCR NEGATIVE NEGATIVE Final    Comment: (NOTE) SARS-CoV-2 target nucleic acids are NOT DETECTED.  The SARS-CoV-2 RNA is generally detectable in upper respiratory specimens during the acute phase of infection. The lowest concentration of SARS-CoV-2 viral copies this assay can detect is 138 copies/mL. A negative result does not preclude SARS-Cov-2 infection and should not be used as the sole basis for treatment or other patient management decisions. A negative result may occur with  improper specimen collection/handling, submission of specimen other than nasopharyngeal swab, presence of viral mutation(s) within the areas targeted by this assay, and inadequate number of viral  copies(<138 copies/mL). A negative result must be combined with clinical observations, patient history, and epidemiological information. The expected result is Negative.  Fact Sheet for Patients:  BloggerCourse.com  Fact Sheet for Healthcare Providers:  SeriousBroker.it  This test is no t yet approved or cleared by the [place] FDA and  has been authorized for detection and/or diagnosis of SARS-CoV-2 by FDA under an Emergency Use Authorization (EUA). This EUA will remain  in effect (meaning this test can be used) for the duration of the COVID-19 declaration under Section 564(b)(1) of the Act, 21 U.S.C.section 360bbb-3(b)(1), unless the authorization is terminated  or revoked sooner.       Influenza A by PCR NEGATIVE NEGATIVE Final   Influenza B by PCR NEGATIVE NEGATIVE Final    Comment: (NOTE) The Xpert Xpress SARS-CoV-2/FLU/RSV plus assay is intended as an aid in the diagnosis of influenza from Nasopharyngeal swab specimens and should not be used as a sole basis for treatment.  Nasal washings and aspirates are unacceptable for Xpert Xpress SARS-CoV-2/FLU/RSV testing.  Fact Sheet for Patients: BloggerCourse.com  Fact Sheet for Healthcare Providers: SeriousBroker.it  This test is not yet approved or cleared by the [place] FDA and has been authorized for detection and/or diagnosis of SARS-CoV-2 by FDA under an Emergency Use Authorization (EUA). This EUA will remain in effect (meaning this test can be used) for the duration of the COVID-19 declaration under Section 564(b)(1) of the Act, 21 U.S.C. section 360bbb-3(b)(1), unless the authorization is terminated or revoked.  Performed at [facility], [address] [place], [place] [address]      Time coordinating discharge: 25 minutes  SIGNED: Lanae Boast, MD  [facility] 05/09/2021, 11:12 AM  If 7PM-7AM, please contact night-coverage [email or web address]

## 2021-05-06 NOTE — Progress Notes (Signed)
   Subjective/Chief Complaint: Tolerating soft diet without issue   Objective: Vital signs in last 24 hours: Temp:  [98 F (36.7 C)-99.1 F (37.3 C)] 98.3 F (36.8 C) (08/23 0536) Pulse Rate:  [67-73] 67 (08/23 0536) Resp:  [14-19] 19 (08/23 0536) BP: (121-137)/(62-70) 121/62 (08/23 0536) SpO2:  [94 %-97 %] 94 % (08/23 0536) Last BM Date: 05/05/21  Intake/Output from previous day: 08/22 0701 - 08/23 0700 In: 3177.4 [P.O.:840; I.V.:2337.4] Out: 1575 [Urine:1575] Intake/Output this shift: No intake/output data recorded.  General appearance: alert and cooperative Resp: clear to auscultation bilaterally Cardio: regular rate and rhythm GI: soft, nontender, nondistended  Lab Results:  Recent Labs    05/05/21 0931 05/06/21 0419  WBC 7.1 7.8  HGB 12.4* 12.2*  HCT 37.5* 37.0*  PLT 189 185    BMET Recent Labs    05/05/21 0931  NA 138  K 3.2*  CL 103  CO2 26  GLUCOSE 250*  BUN 11  CREATININE 1.07  CALCIUM 9.3    PT/INR No results for input(s): LABPROT, INR in the last 72 hours. ABG No results for input(s): PHART, HCO3 in the last 72 hours.  Invalid input(s): PCO2, PO2  Studies/Results: DG Chest 2 View  Result Date: 05/04/2021 CLINICAL DATA:  Pneumonia, noticeable wheezing and shortness of breath EXAM: CHEST - 2 VIEW COMPARISON:  12/26/2020 FINDINGS: Low lung volumes. Infrahilar infiltrate or atelectasis, slightly improved on the left and increased on the right since prior study. Heart size and mediastinal contours are within normal limits. Aortic Atherosclerosis (ICD10-170.0). Blunting of left lateral costophrenic angle suggesting spots full left effusion. No pneumothorax. Visualized bones unremarkable. IMPRESSION: Low volumes. Infrahilar atelectasis or infiltrate, left greater than right. Electronically Signed   By: Corlis Leak M.D.   On: 05/04/2021 12:38    Anti-infectives: Anti-infectives (From admission, onward)    None        Assessment/Plan:  Resolving Sbo  S/P laparoscopic cholecystectomy, laparoscopic liver biopsy, primary umbilical  hernia repair 12/17/2020 Dr. Darnell Level  - Clinically resolved,  - PT/OT for mobilization - OK for discharge when cleared by medical service   FEN: soft ID: no abx DVT: SCDs. Heparin gtt   Hx bacteremia - 12/23/2020 Hypertension Type 2 diabetes hyperglycemic.  Defer to primary medicine service. obstructive sleep apnea -CPAP Asthma/Hx acute respiratory failure 12/13/2020 Hx diastolic congestive heart failure  CKD stage III Wheelchair-bound Obesity with weight loss BMI 38.4>> 34.7 Hx PE on Eliquis GERD  LOS: 5 days    Randy Liu 05/06/2021

## 2021-05-06 NOTE — Progress Notes (Signed)
PHYSICAL THERAPY  Pt was scheduled to D/C today but that was later cancelled.  So will continue to follow until D/C to SNF.  Felecia Shelling  PTA Acute  Rehabilitation Services Pager      [phone] Office      [phone]

## 2021-05-06 NOTE — TOC Transition Note (Deleted)
Transition of Care [facility]) - CM/SW Discharge Note   Patient Details  Name: Randy Liu MRN: [id number] Date of Birth: 13-Jan-1951  Transition of Care [facility]) CM/SW Contact:  Amada Jupiter, LCSW Phone Number: 05/06/2021, 12:40 PM   Clinical Narrative:     Pt and wife have accepted SNF bed offer at [facility] and pt medically cleared for dc today. PTAR called at 12:35pm.  RN to call report to [phone].  No further TOC needs.  Final next level of care: Skilled Nursing Facility Barriers to Discharge: Barriers Resolved   Patient Goals and CMS Choice Patient states their goals for this hospitalization and ongoing recovery are:: go home CMS Medicare.gov Compare Post Acute Care list provided to:: Patient Represenative (must comment) ([name] spouse [phone]) Choice offered to / list presented to : Spouse  Discharge Placement   Existing PASRR number confirmed : 05/05/21          Patient [facility] bed at: [facility] Patient to be transferred to facility by: PTAR Name of family member notified: spouse Patient and family notified of of transfer: 05/06/21  Discharge Plan and Services   Discharge Planning Services: CM Consult            DME Arranged: N/A DME Agency: NA         HH Agency: [facility] Date [facility] Agency Contacted: 05/02/21 Time HH Agency Contacted: 1014 Representative spoke with at [facility] Agency: Marylene Land  Social Determinants of Health (SDOH) Interventions     Readmission Risk Interventions No flowsheet data found.

## 2021-05-07 DIAGNOSIS — K921 Melena: Secondary | ICD-10-CM

## 2021-05-07 LAB — GLUCOSE, CAPILLARY
Glucose-Capillary: 130 mg/dL — ABNORMAL HIGH (ref 70–99)
Glucose-Capillary: 143 mg/dL — ABNORMAL HIGH (ref 70–99)
Glucose-Capillary: 156 mg/dL — ABNORMAL HIGH (ref 70–99)
Glucose-Capillary: 166 mg/dL — ABNORMAL HIGH (ref 70–99)
Glucose-Capillary: 167 mg/dL — ABNORMAL HIGH (ref 70–99)
Glucose-Capillary: 196 mg/dL — ABNORMAL HIGH (ref 70–99)

## 2021-05-07 LAB — CBC
HCT: 35 % — ABNORMAL LOW (ref 39.0–52.0)
Hemoglobin: 11.6 g/dL — ABNORMAL LOW (ref 13.0–17.0)
MCH: 31.2 pg (ref 26.0–34.0)
MCHC: 33.1 g/dL (ref 30.0–36.0)
MCV: 94.1 fL (ref 80.0–100.0)
Platelets: 196 10*3/uL (ref 150–400)
RBC: 3.72 MIL/uL — ABNORMAL LOW (ref 4.22–5.81)
RDW: 14.3 % (ref 11.5–15.5)
WBC: 6.8 10*3/uL (ref 4.0–10.5)
nRBC: 0 % (ref 0.0–0.2)

## 2021-05-07 NOTE — Progress Notes (Signed)
PROGRESS NOTE    Randy Liu  NLZ:[id number] DOB: 1951/04/13 DOA: 05/01/2021 PCP: Felton Clinton, MD   Chief Complaint  Patient presents with   Emesis  Brief Narrative: 70 year old male with history of morbid obesity BMI 34, nonambulatory since 12/01/2020 WC bound type 2 diabetes  on long-term insulin NovoLog Mix 70/30 12 units bid, with gastroparesis on reglan, history of CKD stage IIIA baseline creatinine ~1.3, PE on on Eliquis anticoagulation since 2020, thyroid nodules, OSA/restrictive lung disease on CPAP, hypertension on amlodipine, carvedilol, GERD and PPI, HLD on fenofibrate admitted with: a small bowel obstruction in the setting of prior small bowel obstruction in 2020 and recent surgery on March/2022- +managed conservatively- NGT, ivf- resolved. At this time is tolerating diet and cleared for discharge by general surgery and was being planned for discharge but held due to rectal bleeding.  Subjective: No recurrence of bleeding.  Wife at the bedside. No acute events overnight. Wife at bedside  Assessment and plan  Hematochezia 8/23-seen by GI stopping Eliquis today, planning for colonoscopy today.  He has refused colonoscopy last time in 2018.  Monitor H&H so far relatively stable Recent Labs  Lab 05/05/21 0407 05/05/21 0931 05/06/21 0419 05/06/21 1901 05/07/21 0846  HGB 12.7* 12.4* 12.2* 12.3* 11.6*  HCT 39.0 37.5* 37.0* 38.8* 35.0*     Small bowel obstruction : Managed conservatively.  Resolved.  Tolerating diet.  Surgery signed off.    Type 2 diabetes  on long-term insulin NovoLog Mix 70/30 with gastroparesis on reglan.  HbA1c stable 7.9 .CBG well controlled-on mix insulin and sliding scale insulin.  Insulin held this morning cut down the dose. Recent Labs  Lab 05/06/21 1607 05/06/21 2013 05/07/21 0017 05/07/21 0349 05/07/21 0732  GLUCAP 160* 167* 130* 143* 156*    Functional quadriplegia/nonambulatory since 12/01/2020 WC bound.  Continue PT OT supportive  care.  He lives with his wife at home  CHF with preserved EF Hypertension on amlodipine/carvedilol at home Mild lower leg edema present.  Stable.  Continue his current meds- amlodipine and Coreg at home and follow-up with his primary care doctor  CKD stage IIIA baseline creatinine ~1.3: Currently stable and improved to 1.0.   Recent Labs  Lab 05/01/21 1150 05/02/21 0450 05/03/21 0442 05/05/21 0931  BUN 26* 22 24* 11  CREATININE 1.34* 1.43* 1.34* 1.07    Prior provoked DVT/PE on on Eliquis anticoagulation since 2020: Currently was on heparin gtt>transitioned to home eliquis but stopping today for c scope and rectal bleed  OSA/restrictive lung disease 2/2 ? Covid:on CPAP, Respiratory status is stable.  Continue supportive measures  Gout not active  GERD continue PPI  HLD continue his fenofibrateat.  Morbid obesity BMI 34, will benefit with weight loss, healthy lifestyle and PCP follow-up.   Diet Order             Diet NPO time specified  Diet effective midnight           Diet clear liquid Room service appropriate? Yes; Fluid consistency: Thin  Diet effective midnight                    Patient's Body mass index is 34.7 kg/m. DVT prophylaxis: scd Code Status:   Code Status: Full Code Family Communication: plan of care discussed with patient at bedside. Status is: Inpatient  Remains inpatient appropriate because:Ongoing diagnostic testing needed not appropriate for outpatient work up and Inpatient level of care appropriate due to severity of illness  Dispo:  Patient From:  Home  Planned Disposition: Skilled Nursing Facility after completion of colonoscopy/GI clearance  Medically stable for discharge: No    Unresulted Labs (From admission, onward)     Start     Ordered   05/08/21 0500  CBC  Daily,   R     Question:  Specimen collection method  Answer:  Lab=Lab collect   05/07/21 0824   05/08/21 0500  Basic metabolic panel  Daily,   R     Question:  Specimen  collection method  Answer:  Lab=Lab collect   05/07/21 0824           Medications reviewed:  Scheduled Meds:  bisacodyl  10 mg Rectal Daily   carvedilol  6.25 mg Oral BID WC   insulin aspart  0-6 Units Subcutaneous Q4H   insulin aspart protamine- aspart  30 Units Subcutaneous BID WC   lidocaine  1 patch Transdermal Q24H   lip balm  1 application Topical BID   pantoprazole  40 mg Oral BID   polyethylene glycol-electrolytes  4,000 mL Oral Once   Continuous Infusions:  dextrose 100 mL/hr at 05/06/21 3086   methocarbamol (ROBAXIN) IV     ondansetron (ZOFRAN) IV     Consultants:see note  Procedures:see note Antimicrobials: Anti-infectives (From admission, onward)    None      Culture/Microbiology    Component Value Date/Time   SDES  12/26/2020 1602    URINE, RANDOM Performed at [facility], [address], [place], [place] [address]    SPECREQUEST  12/26/2020 1602    NONE Performed at [facility], [address] [place], [place] [address]    CULT 30,000 COLONIES/mL YEAST (A) 12/26/2020 1602   REPTSTATUS 12/28/2020 FINAL 12/26/2020 1602    Other culture-see note  Objective: Vitals: Today's Vitals   05/06/21 2052 05/06/21 2133 05/07/21 0602 05/07/21 0730  BP:  (!) 124/59 127/69   Pulse:  66 72   Resp:  18 18   Temp:  98.9 F (37.2 C) 98.3 F (36.8 C)   TempSrc:  Oral Oral   SpO2:  97% 95%   Weight:      Height:      PainSc: 0-No pain   0-No pain    Intake/Output Summary (Last 24 hours) at 05/07/2021 1050 Last data filed at 05/07/2021 0602 Gross per 24 hour  Intake 778.43 ml  Output 500 ml  Net 278.43 ml   Filed Weights   05/01/21 1038  Weight: 119.3 kg   Weight change:   Intake/Output from previous day: 08/23 0701 - 08/24 0700 In: 1402.8 [P.O.:720; I.V.:682.8] Out: 825 [Urine:825] Intake/Output this shift: No intake/output data recorded. Filed Weights   05/01/21 1038  Weight: 119.3 kg    Examination: General exam: AAO 3, pleasant,older than stated age, weak appearing. HEENT:Oral mucosa moist, Ear/Nose WNL grossly,dentition normal. Respiratory system: bilaterally diminished,no use of accessory muscle, non tender. Cardiovascular system: S1 & S2 +,No JVD. Gastrointestinal system: Abdomen soft, NT,ND, BS+. Nervous System:Alert, awake, moving extremities Extremities: mild edema, distal peripheral pulses palpable.  Skin: No rashes,no icterus. MSK: Normal muscle bulk,tone, power.  Data Reviewed: I have personally reviewed following labs and imaging studies CBC: Recent Labs  Lab 05/01/21 1150 05/02/21 0450 05/04/21 0335 05/05/21 0407 05/05/21 0931 05/06/21 0419 05/06/21 1901 05/07/21 0846  WBC 7.7   < > 7.5 6.7 7.1 7.8  --  6.8  NEUTROABS 5.7  --   --   --   --   --   --   --  HGB 14.3   < > 12.5* 12.7* 12.4* 12.2* 12.3* 11.6*  HCT 43.0   < > 38.2* 39.0 37.5* 37.0* 38.8* 35.0*  MCV 94.3   < > 94.3 95.4 94.0 96.1  --  94.1  PLT 257   < > 197 188 189 185  --  196   < > = values in this interval not displayed.   Basic Metabolic Panel: Recent Labs  Lab 05/01/21 1150 05/02/21 0450 05/03/21 0442 05/05/21 0931  NA 136 140 139 138  K 4.3 4.0 3.4* 3.2*  CL 99 101 101 103  CO2 GLUCOSE 250* 196* 193* 250*  BUN 26* 22 24* 11  CREATININE 1.34* 1.43* 1.34* 1.07  CALCIUM 9.5 9.8 9.4 9.3   GFR: Estimated Creatinine Clearance: 84.5 mL/min (by C-G formula based on SCr of 1.07 mg/dL). Liver Function Tests: Recent Labs  Lab 05/01/21 1150 05/02/21 0450  AST 36 26  ALT 33 29  ALKPHOS 41 36*  BILITOT 0.6 0.8  PROT 7.1 6.8  ALBUMIN 3.7 3.5   Recent Labs  Lab 05/01/21 1150  LIPASE 19   No results for input(s): AMMONIA in the last 168 hours. Coagulation Profile: No results for input(s): INR, PROTIME in the last 168 hours. Cardiac Enzymes: No results for input(s): CKTOTAL, CKMB, CKMBINDEX, TROPONINI in the last 168 hours. BNP (last 3 results) No  results for input(s): PROBNP in the last 8760 hours. HbA1C: No results for input(s): HGBA1C in the last 72 hours. CBG: Recent Labs  Lab 05/06/21 1607 05/06/21 2013 05/07/21 0017 05/07/21 0349 05/07/21 0732  GLUCAP 160* 167* 130* 143* 156*   Lipid Profile: No results for input(s): CHOL, HDL, LDLCALC, TRIG, CHOLHDL, LDLDIRECT in the last 72 hours. Thyroid Function Tests: No results for input(s): TSH, T4TOTAL, FREET4, T3FREE, THYROIDAB in the last 72 hours. Anemia Panel: No results for input(s): VITAMINB12, FOLATE, FERRITIN, TIBC, IRON, RETICCTPCT in the last 72 hours. Sepsis Labs: No results for input(s): PROCALCITON, LATICACIDVEN in the last 168 hours.  Recent Results (from the past 240 hour(s))  Resp Panel by RT-PCR (Flu A&B, Covid) Nasopharyngeal Swab     Status: None   Collection Time: 05/01/21  2:35 PM   Specimen: Nasopharyngeal Swab; Nasopharyngeal(NP) swabs in vial transport medium  Result Value Ref Range Status   SARS Coronavirus 2 by RT PCR NEGATIVE NEGATIVE Final    Comment: (NOTE) SARS-CoV-2 target nucleic acids are NOT DETECTED.  The SARS-CoV-2 RNA is generally detectable in upper respiratory specimens during the acute phase of infection. The lowest concentration of SARS-CoV-2 viral copies this assay can detect is 138 copies/mL. A negative result does not preclude SARS-Cov-2 infection and should not be used as the sole basis for treatment or other patient management decisions. A negative result may occur with  improper specimen collection/handling, submission of specimen other than nasopharyngeal swab, presence of viral mutation(s) within the areas targeted by this assay, and inadequate number of viral copies(<138 copies/mL). A negative result must be combined with clinical observations, patient history, and epidemiological information. The expected result is Negative.  Fact Sheet for Patients:  BloggerCourse.com  Fact Sheet for  Healthcare Providers:  SeriousBroker.it  This test is no t yet approved or cleared by the [place] FDA and  has been authorized for detection and/or diagnosis of SARS-CoV-2 by FDA under an Emergency Use Authorization (EUA). This EUA will remain  in effect (meaning this test can be used) for the duration of the COVID-19 declaration under  Section 564(b)(1) of the Act, 21 U.S.C.section 360bbb-3(b)(1), unless the authorization is terminated  or revoked sooner.       Influenza A by PCR NEGATIVE NEGATIVE Final   Influenza B by PCR NEGATIVE NEGATIVE Final    Comment: (NOTE) The Xpert Xpress SARS-CoV-2/FLU/RSV plus assay is intended as an aid in the diagnosis of influenza from Nasopharyngeal swab specimens and should not be used as a sole basis for treatment. Nasal washings and aspirates are unacceptable for Xpert Xpress SARS-CoV-2/FLU/RSV testing.  Fact Sheet for Patients: BloggerCourse.comhttps://www.fda.gov/media/152166/download  Fact Sheet for Healthcare Providers: SeriousBroker.ithttps://www.fda.gov/media/152162/download  This test is not yet approved or cleared by the [place] FDA and has been authorized for detection and/or diagnosis of SARS-CoV-2 by FDA under an Emergency Use Authorization (EUA). This EUA will remain in effect (meaning this test can be used) for the duration of the COVID-19 declaration under Section 564(b)(1) of the Act, 21 U.S.C. section 360bbb-3(b)(1), unless the authorization is terminated or revoked.  Performed at [facility] [place], [address] [place], [place] [address]   Resp Panel by RT-PCR (Flu A&B, Covid) Nasopharyngeal Swab     Status: None   Collection Time: 05/06/21 10:44 AM   Specimen: Nasopharyngeal Swab; Nasopharyngeal(NP) swabs in vial transport medium  Result Value Ref Range Status   SARS Coronavirus 2 by RT PCR NEGATIVE NEGATIVE Final    Comment: (NOTE) SARS-CoV-2 target nucleic acids are NOT DETECTED.  The SARS-CoV-2 RNA  is generally detectable in upper respiratory specimens during the acute phase of infection. The lowest concentration of SARS-CoV-2 viral copies this assay can detect is 138 copies/mL. A negative result does not preclude SARS-Cov-2 infection and should not be used as the sole basis for treatment or other patient management decisions. A negative result may occur with  improper specimen collection/handling, submission of specimen other than nasopharyngeal swab, presence of viral mutation(s) within the areas targeted by this assay, and inadequate number of viral copies(<138 copies/mL). A negative result must be combined with clinical observations, patient history, and epidemiological information. The expected result is Negative.  Fact Sheet for Patients:  BloggerCourse.comhttps://www.fda.gov/media/152166/download  Fact Sheet for Healthcare Providers:  SeriousBroker.ithttps://www.fda.gov/media/152162/download  This test is no t yet approved or cleared by the [place] FDA and  has been authorized for detection and/or diagnosis of SARS-CoV-2 by FDA under an Emergency Use Authorization (EUA). This EUA will remain  in effect (meaning this test can be used) for the duration of the COVID-19 declaration under Section 564(b)(1) of the Act, 21 U.S.C.section 360bbb-3(b)(1), unless the authorization is terminated  or revoked sooner.       Influenza A by PCR NEGATIVE NEGATIVE Final   Influenza B by PCR NEGATIVE NEGATIVE Final    Comment: (NOTE) The Xpert Xpress SARS-CoV-2/FLU/RSV plus assay is intended as an aid in the diagnosis of influenza from Nasopharyngeal swab specimens and should not be used as a sole basis for treatment. Nasal washings and aspirates are unacceptable for Xpert Xpress SARS-CoV-2/FLU/RSV testing.  Fact Sheet for Patients: BloggerCourse.comhttps://www.fda.gov/media/152166/download  Fact Sheet for Healthcare Providers: SeriousBroker.ithttps://www.fda.gov/media/152162/download  This test is not yet approved or cleared by the  [place] FDA and has been authorized for detection and/or diagnosis of SARS-CoV-2 by FDA under an Emergency Use Authorization (EUA). This EUA will remain in effect (meaning this test can be used) for the duration of the COVID-19 declaration under Section 564(b)(1) of the Act, 21 U.S.C. section 360bbb-3(b)(1), unless the authorization is terminated or revoked.  Performed at [facility], [address], [place], [place] [address]      Radiology Studies: No results found.   LOS: 6 days   Lanae Boast, MD [facility]  05/07/2021, 10:50 AM

## 2021-05-07 NOTE — Anesthesia Preprocedure Evaluation (Addendum)
Anesthesia Evaluation  Patient identified by MRN, date of birth, ID band Patient awake    Reviewed: Allergy & Precautions, NPO status , Patient's Chart, lab work & pertinent test results  Airway Mallampati: II  TM Distance: >3 FB Neck ROM: Full    Dental no notable dental hx. (+) Teeth Intact, Dental Advisory Given   Pulmonary asthma , former smoker, PE   Pulmonary exam normal breath sounds clear to auscultation       Cardiovascular hypertension, Normal cardiovascular exam Rhythm:Regular Rate:Normal  12/13/20 Echo 1. Left ventricular ejection fraction, by estimation, is 60 to 65%. The left ventricle has normal function. The left ventricle has no regional wall motion abnormalities. There is mild left ventricular hypertrophy. Left ventricular diastolic parameters are consistent with Grade II diastolic dysfunction (pseudonormalization). 2. Right ventricular systolic function is normal. The right ventricular size is normal. There is normal pulmonary artery systolic pressure. 3. The mitral valve is normal in structure. Trivial mitral valve regurgitation. No evidence of mitral stenosis. 4. The aortic valve is normal in structure. Aortic valve regurgitation is not visualized. No aortic stenosis is present. 5. The inferior vena cava is normal in size with greater than 50% respiratory variability, suggesting right atrial pressure of 3 mmHg.   Neuro/Psych negative psych ROS   GI/Hepatic   Endo/Other  diabetes, Type 2  Renal/GU Renal InsufficiencyRenal disease     Musculoskeletal negative musculoskeletal ROS (+)   Abdominal (+) + obese (BMI 34.70),   Peds  Hematology negative hematology ROS (+) anemia , Lab Results      Component                Value               Date                      WBC                      6.8                 05/08/2021                HGB                      11.6 (L)            05/08/2021                 HCT                      35.3 (L)            05/08/2021                MCV                      94.9                05/08/2021                PLT                      206                 05/08/2021              Anesthesia Other Findings   Reproductive/Obstetrics  Anesthesia Physical Anesthesia Plan  ASA: 3  Anesthesia Plan: MAC   Post-op Pain Management:    Induction:   PONV Risk Score and Plan: Treatment may vary due to age or medical condition  Airway Management Planned: Natural Airway and Nasal Cannula  Additional Equipment: None  Intra-op Plan:   Post-operative Plan:   Informed Consent: I have reviewed the patients History and Physical, chart, labs and discussed the procedure including the risks, benefits and alternatives for the proposed anesthesia with the patient or authorized representative who has indicated his/her understanding and acceptance.     Dental advisory given  Plan Discussed with: CRNA  Anesthesia Plan Comments: (Hematachezia for Colonoscopy MAC)       Anesthesia Quick Evaluation

## 2021-05-07 NOTE — Progress Notes (Addendum)
   Subjective/Chief Complaint: Tolerating soft diet without abdominal pain, nausea, emesis. He denies pain in his buttocks   Objective: Vital signs in last 24 hours: Temp:  [98.3 F (36.8 C)-98.9 F (37.2 C)] 98.3 F (36.8 C) (08/24 0602) Pulse Rate:  [66-72] 72 (08/24 0602) Resp:  [18] 18 (08/24 0602) BP: (118-127)/(59-69) 127/69 (08/24 0602) SpO2:  [95 %-99 %] 95 % (08/24 0602) Last BM Date: 05/06/21  Intake/Output from previous day: 08/23 0701 - 08/24 0700 In: 1402.8 [P.O.:720; I.V.:682.8] Out: 825 [Urine:825] Intake/Output this shift: No intake/output data recorded.  General appearance: alert and cooperative Resp: respiratory effort nonlabored Cardio: regular rate and rhythm GI: soft, nontender, nondistended GU: less than 1 cm perianal wound at 7 o'clock. Red but without active bleeding or drainage. Not erythematous or painful to palpation  Lab Results:  Recent Labs    05/06/21 0419 05/06/21 1901 05/07/21 0846  WBC 7.8  --  6.8  HGB 12.2* 12.3* 11.6*  HCT 37.0* 38.8* 35.0*  PLT 185  --  196    BMET Recent Labs    05/05/21 0931  NA 138  K 3.2*  CL 103  CO2 26  GLUCOSE 250*  BUN 11  CREATININE 1.07  CALCIUM 9.3    PT/INR No results for input(s): LABPROT, INR in the last 72 hours. ABG No results for input(s): PHART, HCO3 in the last 72 hours.  Invalid input(s): PCO2, PO2  Studies/Results: No results found.  Anti-infectives: Anti-infectives (From admission, onward)    None       Assessment/Plan:  Sbo  S/P laparoscopic cholecystectomy, laparoscopic liver biopsy, primary umbilical  hernia repair 12/17/2020 Dr. Darnell Level  - Clinically resolved  - PT/OT for mobilization  Hematochezia - per GI  Wound - no procedural/surgical intervention indicated - keep area clean and dry with frequent depends changes as necessary   FEN: soft ID: no abx DVT: SCDs. Heparin gtt   Hx bacteremia - 12/23/2020 Hypertension Type 2 diabetes  hyperglycemic.  Defer to primary medicine service. obstructive sleep apnea -CPAP Asthma/Hx acute respiratory failure 12/13/2020 Hx diastolic congestive heart failure  CKD stage III Wheelchair-bound Obesity with weight loss BMI 38.4>> 34.7 Hx PE on Eliquis GERD   We will sign off but please do not hesitate to contact us for any further questions or concerns    LOS: 6 days    Eric Form 05/07/2021    I agree with this assessment and plan. Obstruction resolved. Will sign off.  Berna Bue MD FACS

## 2021-05-08 ENCOUNTER — Inpatient Hospital Stay (HOSPITAL_COMMUNITY): Payer: Medicare Other | Admitting: Anesthesiology

## 2021-05-08 ENCOUNTER — Encounter (HOSPITAL_COMMUNITY): Payer: Self-pay | Admitting: Family Medicine

## 2021-05-08 ENCOUNTER — Encounter (HOSPITAL_COMMUNITY)
Admission: EM | Disposition: A | Discharge status: Skilled Nursing Facility | Payer: Self-pay | Source: Home / Self Care | Attending: Internal Medicine

## 2021-05-08 HISTORY — PX: COLONOSCOPY WITH PROPOFOL: SHX5780

## 2021-05-08 HISTORY — PX: POLYPECTOMY: SHX5525

## 2021-05-08 LAB — CBC
HCT: 35.3 % — ABNORMAL LOW (ref 39.0–52.0)
Hemoglobin: 11.6 g/dL — ABNORMAL LOW (ref 13.0–17.0)
MCH: 31.2 pg (ref 26.0–34.0)
MCHC: 32.9 g/dL (ref 30.0–36.0)
MCV: 94.9 fL (ref 80.0–100.0)
Platelets: 206 10*3/uL (ref 150–400)
RBC: 3.72 MIL/uL — ABNORMAL LOW (ref 4.22–5.81)
RDW: 14.3 % (ref 11.5–15.5)
WBC: 6.8 10*3/uL (ref 4.0–10.5)
nRBC: 0 % (ref 0.0–0.2)

## 2021-05-08 LAB — GLUCOSE, CAPILLARY
Glucose-Capillary: 123 mg/dL — ABNORMAL HIGH (ref 70–99)
Glucose-Capillary: 130 mg/dL — ABNORMAL HIGH (ref 70–99)
Glucose-Capillary: 131 mg/dL — ABNORMAL HIGH (ref 70–99)
Glucose-Capillary: 150 mg/dL — ABNORMAL HIGH (ref 70–99)
Glucose-Capillary: 172 mg/dL — ABNORMAL HIGH (ref 70–99)
Glucose-Capillary: 183 mg/dL — ABNORMAL HIGH (ref 70–99)

## 2021-05-08 LAB — BASIC METABOLIC PANEL
Anion gap: 8 (ref 5–15)
BUN: 14 mg/dL (ref 8–23)
CO2: 23 mmol/L (ref 22–32)
Calcium: 9 mg/dL (ref 8.9–10.3)
Chloride: 102 mmol/L (ref 98–111)
Creatinine, Ser: 1.05 mg/dL (ref 0.61–1.24)
GFR, Estimated: >60 mL/min (ref >60)
Glucose, Bld: 174 mg/dL — ABNORMAL HIGH (ref 70–99)
Potassium: 3.2 mmol/L — ABNORMAL LOW (ref 3.5–5.1)
Sodium: 133 mmol/L — ABNORMAL LOW (ref 135–145)

## 2021-05-08 SURGERY — COLONOSCOPY WITH PROPOFOL
Anesthesia: Monitor Anesthesia Care

## 2021-05-08 MED ORDER — SODIUM CHLORIDE 0.9 % IV SOLN: INTRAVENOUS | Status: DC | PRN

## 2021-05-08 MED ORDER — POTASSIUM CHLORIDE 10 MEQ/100ML IV SOLN
10.0000 meq | INTRAVENOUS | Status: AC
Start: 2021-05-08 — End: 2021-05-08
  Administered 2021-05-08 (×4): 10 meq via INTRAVENOUS
  Filled 2021-05-08: qty 100

## 2021-05-08 MED ORDER — LIDOCAINE HCL (CARDIAC) PF 100 MG/5ML IV SOSY
PREFILLED_SYRINGE | INTRAVENOUS | Status: DC | PRN
  Administered 2021-05-08: 100 mg via INTRAVENOUS

## 2021-05-08 MED ORDER — POTASSIUM CHLORIDE CRYS ER 20 MEQ PO TBCR: 40.0000 meq | EXTENDED_RELEASE_TABLET | Freq: Once | ORAL | Status: DC

## 2021-05-08 MED ORDER — PROPOFOL 10 MG/ML IV BOLUS
INTRAVENOUS | Status: AC
  Filled 2021-05-08: qty 20

## 2021-05-08 MED ORDER — PROPOFOL 10 MG/ML IV BOLUS
INTRAVENOUS | Status: DC | PRN
  Administered 2021-05-08 (×2): 20 mg via INTRAVENOUS
  Administered 2021-05-08: 30 mg via INTRAVENOUS

## 2021-05-08 MED ORDER — PROPOFOL 500 MG/50ML IV EMUL
INTRAVENOUS | Status: DC | PRN
  Administered 2021-05-08: 100 ug/kg/min via INTRAVENOUS

## 2021-05-08 SURGICAL SUPPLY — 22 items

## 2021-05-08 NOTE — Transfer of Care (Signed)
Immediate Anesthesia Transfer of Care Note  Patient: Randy Liu  Procedure(s) Performed: COLONOSCOPY WITH PROPOFOL POLYPECTOMY  Patient Location: Endoscopy Unit  Anesthesia Type:MAC  Level of Consciousness: drowsy  Airway & Oxygen Therapy: Patient Spontanous Breathing  Post-op Assessment: Report given to RN and Post -op Vital signs reviewed and stable  Post vital signs: Reviewed and stable  Last Vitals:  Vitals Value Taken Time  BP 96/43   Temp    Pulse 64 05/08/21 1432  Resp 18 05/08/21 1432  SpO2 95 % 05/08/21 1432  Vitals shown include unvalidated device data.  Last Pain:  Vitals:   05/08/21 1344  TempSrc: Temporal  PainSc: 0-No pain      Patients Stated Pain Goal: 2 (69/48/54 6270)  Complications: No notable events documented.

## 2021-05-08 NOTE — Progress Notes (Signed)
BP of 142/69 reported to Nurse Dahlia Client.

## 2021-05-08 NOTE — Progress Notes (Signed)
PROGRESS NOTE    Randy Liu  FGH:[id number] DOB: 1951-05-24 DOA: 05/01/2021 PCP: Felton Clinton, MD   Chief Complaint  Patient presents with   Emesis  Brief Narrative: 70 year old male with history of morbid obesity BMI 34, nonambulatory since 12/01/2020 WC bound type 2 diabetes  on long-term insulin NovoLog Mix 70/30 12 units bid, with gastroparesis on reglan, history of CKD stage IIIA baseline creatinine ~1.3, PE on on Eliquis anticoagulation since 2020, thyroid nodules, OSA/restrictive lung disease on CPAP, hypertension on amlodipine, carvedilol, GERD and PPI, HLD on fenofibrate admitted with: a small bowel obstruction in the setting of prior small bowel obstruction in 2020 and recent surgery on March/2022- +managed conservatively- NGT, ivf- resolved. At this time is tolerating diet and cleared for discharge by general surgery and was being planned for discharge but held due to rectal bleeding.  Subjective: Seen this morning.  Drinking prep awaiting for procedure Wife at the bedside.  No other new events.  Assessment and plan  Hematochezia 8/23-seen by GI.  Off Eliquis for colo- today.onitor H&H so far appears stable.  Recent Labs  Lab 05/05/21 0931 05/06/21 0419 05/06/21 1901 05/07/21 0846 05/08/21 0419  HGB 12.4* 12.2* 12.3* 11.6* 11.6*  HCT 37.5* 37.0* 38.8* 35.0* 35.3*     CVE:LFYBOFB conservatively.  Resolved.  Tolerating diet.  Surgery signed off.    Type 2 diabetes  on long-term insulin NovoLog Mix 70/30 with gastroparesis on reglan.  HbA1c stable 7.9 .CBG well controlled-on mix insulin and sliding scale insulin- MONITOR. Recent Labs  Lab 05/07/21 2016 05/08/21 0036 05/08/21 0357 05/08/21 0748 05/08/21 1154  GLUCAP 166* 172* 150* 183* 131*     Functional quadriplegia/nonambulatory since 12/01/2020 WC bound.  Continue PT OT supportive care.  He lives with his wife at home  CHF with preserved EF Hypertension on amlodipine/carvedilol at home Mild lower leg  edema present.  Stable.  Continue his current meds- amlodipine and Coreg at home and follow-up with his primary care doctor  CKD stage IIIA baseline creatinine ~1.3: Renal function stable..   Recent Labs  Lab 05/02/21 0450 05/03/21 0442 05/05/21 0931 05/08/21 0419  BUN 22 24* 11 14  CREATININE 1.43* 1.34* 1.07 1.05    Mild hypokalemia replacement ordered.   Prior provoked DVT/PE on on Eliquis anticoagulation since 2020: Currently was on heparin gtt>transitioned to home eliquis but stopping today for c scope and rectal bleed  OSA/restrictive lung disease 2/2 ? Covid:on CPAP, Respiratory status is stable.  Continue supportive measures  Gout not active  GERD continue PPI  HLD continue his fenofibrateat.  Morbid obesity BMI 34, will benefit with weight loss, healthy lifestyle and PCP follow-up.   Diet Order             Diet NPO time specified  Diet effective midnight                    Patient's Body mass index is 34.7 kg/m. DVT prophylaxis: scd Code Status:   Code Status: Full Code Family Communication: plan of care discussed with patient at bedside. Status is: Inpatient  Remains inpatient appropriate because:Ongoing diagnostic testing needed not appropriate for outpatient work up and Inpatient level of care appropriate due to severity of illness  Dispo:  Patient From: Home  Planned Disposition: Skilled Nursing Facility once GI is okay likely tomorrow   Medically stable for discharge: No    Unresulted Labs (From admission, onward)     Start     Ordered   05/08/21  0500  CBC  Daily,   R     Question:  Specimen collection method  Answer:  Lab=Lab collect   05/07/21 0824   05/08/21 0500  Basic metabolic panel  Daily,   R     Question:  Specimen collection method  Answer:  Lab=Lab collect   05/07/21 0824           Medications reviewed:  Scheduled Meds:  bisacodyl  10 mg Rectal Daily   carvedilol  6.25 mg Oral BID WC   insulin aspart  0-6 Units  Subcutaneous Q4H   insulin aspart protamine- aspart  30 Units Subcutaneous BID WC   lidocaine  1 patch Transdermal Q24H   lip balm  1 application Topical BID   pantoprazole  40 mg Oral BID   Continuous Infusions:  dextrose 100 mL/hr at 05/06/21 4098   methocarbamol (ROBAXIN) IV     ondansetron (ZOFRAN) IV     potassium chloride 10 mEq (05/08/21 1215)   Consultants:see note  Procedures:see note Antimicrobials: Anti-infectives (From admission, onward)    None      Culture/Microbiology    Component Value Date/Time   SDES  12/26/2020 1602    URINE, RANDOM Performed at [facility], [address] [place], [place] [address]    SPECREQUEST  12/26/2020 1602    NONE Performed at [facility], [address] [place], [place] [address]    CULT 30,000 COLONIES/mL YEAST (A) 12/26/2020 1602   REPTSTATUS 12/28/2020 FINAL 12/26/2020 1602    Other culture-see note  Objective: Vitals: Today's Vitals   05/08/21 0453 05/08/21 0800 05/08/21 0906 05/08/21 1101  BP: 136/65 (!) 142/69    Pulse: 70 68    Resp: 17 20    Temp: 97.9 F (36.6 C) 98.4 F (36.9 C)    TempSrc: Oral Oral    SpO2: 95% 95%    Weight:      Height:      PainSc:  0-No pain 0-No pain 9     Intake/Output Summary (Last 24 hours) at 05/08/2021 1257 Last data filed at 05/08/2021 1124 Gross per 24 hour  Intake 1380 ml  Output 1250 ml  Net 130 ml    Filed Weights   05/01/21 1038  Weight: 119.3 kg   Weight change:   Intake/Output from previous day: 08/24 0701 - 08/25 0700 In: 120 [P.O.:120] Out: 1350 [Urine:1350] Intake/Output this shift: Total I/O In: 1260 [P.O.:960; Other:300] Out: 400 [Urine:400] Filed Weights   05/01/21 1038  Weight: 119.3 kg   Examination: General exam: AAOx3, obese,older than stated age, weak appearing. HEENT:Oral mucosa moist, Ear/Nose WNL grossly, dentition normal. Respiratory system: bilaterally diminished,  no use of accessory  muscle Cardiovascular system: S1 & S2 +, No JVD,. Gastrointestinal system: Abdomen soft,NT,ND, BS+ Nervous System:Alert, awake, moving extremities and grossly nonfocal Extremities: mild edema, distal peripheral pulses palpable.  Skin: No rashes,no icterus. MSK: Normal muscle bulk,tone, power   Data Reviewed: I have personally reviewed following labs and imaging studies CBC: Recent Labs  Lab 05/05/21 0407 05/05/21 0931 05/06/21 0419 05/06/21 1901 05/07/21 0846 05/08/21 0419  WBC 6.7 7.1 7.8  --  6.8 6.8  HGB 12.7* 12.4* 12.2* 12.3* 11.6* 11.6*  HCT 39.0 37.5* 37.0* 38.8* 35.0* 35.3*  MCV 95.4 94.0 96.1  --  94.1 94.9  PLT 188 189 185  --  196 206    Basic Metabolic Panel: Recent Labs  Lab 05/02/21 0450 05/03/21 0442 05/05/21 0931 05/08/21 0419  NA  140 139 138 133*  K 4.0 3.4* 3.2* 3.2*  CL 101 101 103 102  CO2 28 25 26 23   GLUCOSE 196* 193* 250* 174*  BUN 22 24* 11 14  CREATININE 1.43* 1.34* 1.07 1.05  CALCIUM 9.8 9.4 9.3 9.0    GFR: Estimated Creatinine Clearance: 86.1 mL/min (by C-G formula based on SCr of 1.05 mg/dL). Liver Function Tests: Recent Labs  Lab 05/02/21 0450  AST 26  ALT 29  ALKPHOS 36*  BILITOT 0.8  PROT 6.8  ALBUMIN 3.5    No results for input(s): LIPASE, AMYLASE in the last 168 hours.  No results for input(s): AMMONIA in the last 168 hours. Coagulation Profile: No results for input(s): INR, PROTIME in the last 168 hours. Cardiac Enzymes: No results for input(s): CKTOTAL, CKMB, CKMBINDEX, TROPONINI in the last 168 hours. BNP (last 3 results) No results for input(s): PROBNP in the last 8760 hours. HbA1C: No results for input(s): HGBA1C in the last 72 hours. CBG: Recent Labs  Lab 05/07/21 2016 05/08/21 0036 05/08/21 0357 05/08/21 0748 05/08/21 1154  GLUCAP 166* 172* 150* 183* 131*    Lipid Profile: No results for input(s): CHOL, HDL, LDLCALC, TRIG, CHOLHDL, LDLDIRECT in the last 72 hours. Thyroid Function Tests: No results  for input(s): TSH, T4TOTAL, FREET4, T3FREE, THYROIDAB in the last 72 hours. Anemia Panel: No results for input(s): VITAMINB12, FOLATE, FERRITIN, TIBC, IRON, RETICCTPCT in the last 72 hours. Sepsis Labs: No results for input(s): PROCALCITON, LATICACIDVEN in the last 168 hours.  Recent Results (from the past 240 hour(s))  Resp Panel by RT-PCR (Flu A&B, Covid) Nasopharyngeal Swab     Status: None   Collection Time: 05/01/21  2:35 PM   Specimen: Nasopharyngeal Swab; Nasopharyngeal(NP) swabs in vial transport medium  Result Value Ref Range Status   SARS Coronavirus 2 by RT PCR NEGATIVE NEGATIVE Final    Comment: (NOTE) SARS-CoV-2 target nucleic acids are NOT DETECTED.  The SARS-CoV-2 RNA is generally detectable in upper respiratory specimens during the acute phase of infection. The lowest concentration of SARS-CoV-2 viral copies this assay can detect is 138 copies/mL. A negative result does not preclude SARS-Cov-2 infection and should not be used as the sole basis for treatment or other patient management decisions. A negative result may occur with  improper specimen collection/handling, submission of specimen other than nasopharyngeal swab, presence of viral mutation(s) within the areas targeted by this assay, and inadequate number of viral copies(<138 copies/mL). A negative result must be combined with clinical observations, patient history, and epidemiological information. The expected result is Negative.  Fact Sheet for Patients:  BloggerCourse.comhttps://www.fda.gov/media/152166/download  Fact Sheet for Healthcare Providers:  SeriousBroker.ithttps://www.fda.gov/media/152162/download  This test is no t yet approved or cleared by the [place] FDA and  has been authorized for detection and/or diagnosis of SARS-CoV-2 by FDA under an Emergency Use Authorization (EUA). This EUA will remain  in effect (meaning this test can be used) for the duration of the COVID-19 declaration under Section 564(b)(1) of the Act,  21 U.S.C.section 360bbb-3(b)(1), unless the authorization is terminated  or revoked sooner.       Influenza A by PCR NEGATIVE NEGATIVE Final   Influenza B by PCR NEGATIVE NEGATIVE Final    Comment: (NOTE) The Xpert Xpress SARS-CoV-2/FLU/RSV plus assay is intended as an aid in the diagnosis of influenza from Nasopharyngeal swab specimens and should not be used as a sole basis for treatment. Nasal washings and aspirates are unacceptable for Xpert Xpress SARS-CoV-2/FLU/RSV testing.  Fact Sheet  for Patients: BloggerCourse.com  Fact Sheet for Healthcare Providers: SeriousBroker.it  This test is not yet approved or cleared by the [place] FDA and has been authorized for detection and/or diagnosis of SARS-CoV-2 by FDA under an Emergency Use Authorization (EUA). This EUA will remain in effect (meaning this test can be used) for the duration of the COVID-19 declaration under Section 564(b)(1) of the Act, 21 U.S.C. section 360bbb-3(b)(1), unless the authorization is terminated or revoked.  Performed at [facility], [address] [place], [place] [address]   Resp Panel by RT-PCR (Flu A&B, Covid) Nasopharyngeal Swab     Status: None   Collection Time: 05/06/21 10:44 AM   Specimen: Nasopharyngeal Swab; Nasopharyngeal(NP) swabs in vial transport medium  Result Value Ref Range Status   SARS Coronavirus 2 by RT PCR NEGATIVE NEGATIVE Final    Comment: (NOTE) SARS-CoV-2 target nucleic acids are NOT DETECTED.  The SARS-CoV-2 RNA is generally detectable in upper respiratory specimens during the acute phase of infection. The lowest concentration of SARS-CoV-2 viral copies this assay can detect is 138 copies/mL. A negative result does not preclude SARS-Cov-2 infection and should not be used as the sole basis for treatment or other patient management decisions. A negative result may occur with  improper specimen  collection/handling, submission of specimen other than nasopharyngeal swab, presence of viral mutation(s) within the areas targeted by this assay, and inadequate number of viral copies(<138 copies/mL). A negative result must be combined with clinical observations, patient history, and epidemiological information. The expected result is Negative.  Fact Sheet for Patients:  BloggerCourse.com  Fact Sheet for Healthcare Providers:  SeriousBroker.it  This test is no t yet approved or cleared by the [place] FDA and  has been authorized for detection and/or diagnosis of SARS-CoV-2 by FDA under an Emergency Use Authorization (EUA). This EUA will remain  in effect (meaning this test can be used) for the duration of the COVID-19 declaration under Section 564(b)(1) of the Act, 21 U.S.C.section 360bbb-3(b)(1), unless the authorization is terminated  or revoked sooner.       Influenza A by PCR NEGATIVE NEGATIVE Final   Influenza B by PCR NEGATIVE NEGATIVE Final    Comment: (NOTE) The Xpert Xpress SARS-CoV-2/FLU/RSV plus assay is intended as an aid in the diagnosis of influenza from Nasopharyngeal swab specimens and should not be used as a sole basis for treatment. Nasal washings and aspirates are unacceptable for Xpert Xpress SARS-CoV-2/FLU/RSV testing.  Fact Sheet for Patients: BloggerCourse.com  Fact Sheet for Healthcare Providers: SeriousBroker.it  This test is not yet approved or cleared by the [place] FDA and has been authorized for detection and/or diagnosis of SARS-CoV-2 by FDA under an Emergency Use Authorization (EUA). This EUA will remain in effect (meaning this test can be used) for the duration of the COVID-19 declaration under Section 564(b)(1) of the Act, 21 U.S.C. section 360bbb-3(b)(1), unless the authorization is terminated or revoked.  Performed at [facility], [address], [place], [place] [address]       Radiology Studies: No results found.   LOS: 7 days   Lanae Boast, MD [facility]  05/08/2021, 12:57 PM

## 2021-05-08 NOTE — Anesthesia Postprocedure Evaluation (Signed)
Anesthesia Post Note  Patient: Randy Liu  Procedure(s) Performed: COLONOSCOPY WITH PROPOFOL POLYPECTOMY     Patient location during evaluation: Endoscopy Anesthesia Type: MAC Level of consciousness: awake and alert Pain management: pain level controlled Vital Signs Assessment: post-procedure vital signs reviewed and stable Respiratory status: spontaneous breathing, nonlabored ventilation, respiratory function stable and patient connected to nasal cannula oxygen Cardiovascular status: blood pressure returned to baseline and stable Postop Assessment: no apparent nausea or vomiting Anesthetic complications: no   No notable events documented.  Last Vitals:  Vitals:   05/08/21 1440 05/08/21 1450  BP: 136/62 (!) 157/69  Pulse: 66 63  Resp: 17 15  Temp:    SpO2: 93% 93%    Last Pain:  Vitals:   05/08/21 1450  TempSrc:   PainSc: 0-No pain                 Barnet Glasgow

## 2021-05-08 NOTE — Progress Notes (Signed)
Patient had 4 cups of the prep-solution since shift shift change.

## 2021-05-08 NOTE — Progress Notes (Signed)
PT Cancellation Note  Patient Details Name: Garren Greenman MRN: [id number] DOB: 21-Oct-1950   Cancelled Treatment:     Discharge canceled due to rectal bleeding.  Pt scheduled for a Colonoscopy today.  Pt has been evaluated with rec for SNF.  Will continue to follow during heis Acute stay.  Felecia Shelling  PTA Acute  Rehabilitation Services Pager      [phone] Office      [phone]

## 2021-05-08 NOTE — Op Note (Signed)
[facility] Patient Name: Randy Liu Procedure Date: 05/08/2021 MRN: [id number] Attending MD: Jeani Hawking , MD Date of Birth: 08-26-1951 CSN: [id number] Age: 70 Admit Type: Inpatient Procedure:                Colonoscopy Indications:              Hematochezia Providers:                Jeani Hawking, MD, Adolph Pollack, RN, Beryle Beams, Technician, CRNA Gwynne Edinger Referring MD:              Medicines:                Propofol per Anesthesia Complications:            No immediate complications. Estimated Blood Loss:     Estimated blood loss: none. Procedure:                Pre-Anesthesia Assessment:                           - Prior to the procedure, a History and Physical                            was performed, and patient medications and                            allergies were reviewed. The patient's tolerance of                            previous anesthesia was also reviewed. The risks                            and benefits of the procedure and the sedation                            options and risks were discussed with the patient.                            All questions were answered, and informed consent                            was obtained. Prior Anticoagulants: The patient has                            taken no previous anticoagulant or antiplatelet                            agents. ASA Grade Assessment: III - A patient with                            severe systemic disease. After reviewing the risks                            and  benefits, the patient was deemed in                            satisfactory condition to undergo the procedure.                           - Sedation was administered by an anesthesia                            professional. Deep sedation was attained.                           After obtaining informed consent, the colonoscope                            was passed under direct  vision. Throughout the                            procedure, the patient's blood pressure, pulse, and                            oxygen saturations were monitored continuously. The                            [id number]-[id number] ([id number] Olympus colonoscope was                            introduced through the anus and advanced to the the                            cecum, identified by appendiceal orifice and                            ileocecal valve. The colonoscopy was performed                            without difficulty. The patient tolerated the                            procedure well. The quality of the bowel                            preparation was good. The ileocecal valve,                            appendiceal orifice, and rectum were photographed. Scope In: 2:01:48 PM Scope Out: 2:25:31 PM Total Procedure Duration: 0 hours 23 minutes 43 seconds  Findings:      A 15 mm polyp was found in the ascending colon. The polyp was       pedunculated. The polyp was removed with a hot snare. Resection and       retrieval were complete.      Two sessile polyps were found in the descending colon and ascending       colon. The polyps were 2 to 3 mm in size.  These polyps were removed with       a cold snare. Resection was complete, but the polyp tissue was only       partially retrieved.      A significant amount of thick liquid stool was in the colon and the       fluid was suctioned. Good to excellent view of the mucosa were obtained. Impression:               - One 15 mm polyp in the ascending colon, removed                            with a hot snare. Resected and retrieved.                           - Two 2 to 3 mm polyps in the descending colon and                            in the ascending colon, removed with a cold snare.                            Complete resection. Partial retrieval. Moderate Sedation:      Not Applicable - Patient had care per Anesthesia. Recommendation:            - Return patient to hospital ward for ongoing care.                           - Resume regular diet.                           - Continue present medications.                           - Await pathology results.                           - Repeat colonoscopy in 3 years for surveillance.                           - Hold Eliquis for an additional 3 days. Procedure Code(s):        --- Professional ---                           [phone], Colonoscopy, flexible; with removal of                            tumor(s), polyp(s), or other lesion(s) by snare                            technique Diagnosis Code(s):        --- Professional ---                           K63.5, Polyp of colon                           K92.1,  Melena (includes Hematochezia) CPT copyright 2019 American Medical Association. All rights reserved. The codes documented in this report are preliminary and upon coder review may  be revised to meet current compliance requirements. Jeani Hawking, MD Jeani Hawking, MD 05/08/2021 2:37:37 PM This report has been signed electronically. Number of Addenda: 0

## 2021-05-09 ENCOUNTER — Encounter (HOSPITAL_COMMUNITY): Payer: Self-pay | Admitting: Gastroenterology

## 2021-05-09 LAB — GLUCOSE, CAPILLARY
Glucose-Capillary: 138 mg/dL — ABNORMAL HIGH (ref 70–99)
Glucose-Capillary: 146 mg/dL — ABNORMAL HIGH (ref 70–99)
Glucose-Capillary: 162 mg/dL — ABNORMAL HIGH (ref 70–99)
Glucose-Capillary: 173 mg/dL — ABNORMAL HIGH (ref 70–99)
Glucose-Capillary: 176 mg/dL — ABNORMAL HIGH (ref 70–99)

## 2021-05-09 LAB — BASIC METABOLIC PANEL
Anion gap: 12 (ref 5–15)
BUN: 12 mg/dL (ref 8–23)
CO2: 23 mmol/L (ref 22–32)
Calcium: 9.1 mg/dL (ref 8.9–10.3)
Chloride: 101 mmol/L (ref 98–111)
Creatinine, Ser: 1.01 mg/dL (ref 0.61–1.24)
GFR, Estimated: >60 mL/min (ref >60)
Glucose, Bld: 159 mg/dL — ABNORMAL HIGH (ref 70–99)
Potassium: 3.8 mmol/L (ref 3.5–5.1)
Sodium: 136 mmol/L (ref 135–145)

## 2021-05-09 LAB — SARS CORONAVIRUS 2 (TAT 6-24 HRS): SARS Coronavirus 2: NEGATIVE

## 2021-05-09 LAB — CBC
HCT: 35.6 % — ABNORMAL LOW (ref 39.0–52.0)
Hemoglobin: 11.5 g/dL — ABNORMAL LOW (ref 13.0–17.0)
MCH: 30.7 pg (ref 26.0–34.0)
MCHC: 32.3 g/dL (ref 30.0–36.0)
MCV: 95.2 fL (ref 80.0–100.0)
Platelets: 231 10*3/uL (ref 150–400)
RBC: 3.74 MIL/uL — ABNORMAL LOW (ref 4.22–5.81)
RDW: 14.2 % (ref 11.5–15.5)
WBC: 7.5 10*3/uL (ref 4.0–10.5)
nRBC: 0 % (ref 0.0–0.2)

## 2021-05-09 LAB — SURGICAL PATHOLOGY

## 2021-05-09 MED ORDER — POLYVINYL ALCOHOL 1.4 % OP SOLN
1.0000 [drp] | OPHTHALMIC | Status: DC | PRN
  Administered 2021-05-09: 1 [drp] via OPHTHALMIC
  Filled 2021-05-09: qty 15

## 2021-05-09 MED ORDER — ELIQUIS 5 MG PO TABS: 5.0000 mg | ORAL_TABLET | Freq: Two times a day (BID) | ORAL | Status: AC

## 2021-05-09 MED ORDER — NOVOLOG MIX 70/30 FLEXPEN (70-30) 100 UNIT/ML ~~LOC~~ SUPN: 30.0000 [IU] | PEN_INJECTOR | Freq: Two times a day (BID) | SUBCUTANEOUS | 11 refills | Status: DC

## 2021-05-09 MED ORDER — CARVEDILOL 6.25 MG PO TABS: 6.2500 mg | ORAL_TABLET | Freq: Two times a day (BID) | ORAL | Status: DC

## 2021-05-09 NOTE — Care Management Important Message (Signed)
Medicare IM printed for [employer] to give to the patient. 

## 2021-05-09 NOTE — Progress Notes (Addendum)
PROGRESS NOTE    Randy Liu  LPF:[id number] DOB: 1950-10-05 DOA: 05/01/2021 PCP: Felton Clinton, MD   Chief Complaint  Patient presents with   Emesis  Brief Narrative: 70 year old male with history of morbid obesity BMI 34, nonambulatory since 12/01/2020 WC bound type 2 diabetes  on long-term insulin NovoLog Mix 70/30 12 units bid, with gastroparesis on reglan, history of CKD stage IIIA baseline creatinine ~1.3, PE on on Eliquis anticoagulation since 2020, thyroid nodules, OSA/restrictive lung disease on CPAP, hypertension on amlodipine, carvedilol, GERD and PPI, HLD on fenofibrate admitted with: a small bowel obstruction in the setting of prior small bowel obstruction in 2020 and recent surgery on March/2022- +managed conservatively- NGT, ivf- resolved. At this time is tolerating diet and cleared for discharge by general surgery and was being planned for discharge but held due to rectal bleeding.  Underwent colonoscopy see the report below GI advised to hold Eliquis for an additional 3 days can resume on 8/28  Subjective: Reports she feels well today no nausea vomiting no rectal bleeding.  He feels ready for discharge today.  Assessment and plan  Hematochezia 8/23-seen by GI.  Hemoglobin is stable underwent colonoscopy found to have " One 15 mm polyp in the ascending colon, removed                            with a hot snare. Resected and retrieved.                           - Two 2 to 3 mm polyps in the descending colon and                            in the ascending colon, removed with a cold snare.                            Complete resection. Partial retrieval. Recommendation:           - Return patient to hospital ward for ongoing care.                           - Resume regular diet.                           - Continue present medications.                           - Await pathology results.                           - Repeat colonoscopy in 3 years for surveillance.                            - Hold Eliquis for an additional 3 days" Okay for discharge. Recent Labs  Lab 05/06/21 0419 05/06/21 1901 05/07/21 0846 05/08/21 0419 05/09/21 0431  HGB 12.2* 12.3* 11.6* 11.6* 11.5*  HCT 37.0* 38.8* 35.0* 35.3* 35.6*     ZHG:DJMEQAS conservatively.  Resolved.  Tolerating diet.  Surgery signed off.    Type 2 diabetes  on long-term insulin NovoLog Mix 70/30 with gastroparesis on reglan.  HbA1c stable 7.9 .CBG  well controlled-on mix insulin and sliding scale insulin-resume on discharge. Recent Labs  Lab 05/08/21 1619 05/08/21 1950 05/09/21 0034 05/09/21 0430 05/09/21 0809  GLUCAP 123* 130* 146* 138* 162*     Functional quadriplegia/nonambulatory since 12/01/2020 WC bound.  Continue PT OT supportive care.  He lives with his wife at home  CHF with preserved EF Hypertension on amlodipine/carvedilol at home Mild lower leg edema present.  Stable.  Continue his current meds- amlodipine and Coreg at home and follow-up with his primary care doctor  CKD stage IIIA baseline creatinine ~1.3: Renal function stable..   Recent Labs  Lab 05/03/21 0442 05/05/21 0931 05/08/21 0419 05/09/21 0431  BUN 24* 11 14 12   CREATININE 1.34* 1.07 1.05 1.01    Mild hypokalemia replacement ordered.   Prior provoked DVT/PE on on Eliquis anticoagulation since 2020: Per GI can resume Eliquis after 3 days  OSA/restrictive lung disease 2/2 ? Covid:on CPAP, Respiratory status is stable.  Continue supportive measures  Gout not active  GERD continue PPI  HLD continue his fenofibrateat.  Morbid obesity BMI 34, will benefit with weight loss, healthy lifestyle and PCP follow-up.   Diet Order             Diet regular Room service appropriate? Yes; Fluid consistency: Thin  Diet effective now                    Patient's Body mass index is 34.7 kg/m. DVT prophylaxis: scd Code Status:   Code Status: Full Code Family Communication: plan of care discussed with patient at  bedside. Status is: Inpatient  Remains inpatient appropriate because:Ongoing diagnostic testing needed not appropriate for outpatient work up and Inpatient level of care appropriate due to severity of illness  Dispo:  Patient From: Home  Planned Disposition: Skilled Nursing Facility today after COVID swab is back and negative  Medically stable for discharge: yes    Unresulted Labs (From admission, onward)     Start     Ordered   05/08/21 2000  SARS CORONAVIRUS 2 (TAT 6-24 HRS) Nasopharyngeal Nasopharyngeal Swab  Once,   R       Question Answer Comment  Is this test for diagnosis or screening Screening   Symptomatic for COVID-19 as defined by CDC No   Hospitalized for COVID-19 No   Admitted to ICU for COVID-19 No   Previously tested for COVID-19 Yes   Resident in a congregate (group) care setting Yes   Employed in healthcare setting No   Has patient completed COVID vaccination(s) (2 doses of Pfizer/Moderna 1 dose of 05/10/21) Unknown      05/08/21 1302   05/08/21 0500  CBC  Daily,   R     Question:  Specimen collection method  Answer:  Lab=Lab collect   05/07/21 0824   05/08/21 0500  Basic metabolic panel  Daily,   R     Question:  Specimen collection method  Answer:  Lab=Lab collect   05/07/21 0824           Medications reviewed:  Scheduled Meds:  bisacodyl  10 mg Rectal Daily   carvedilol  6.25 mg Oral BID WC   insulin aspart  0-6 Units Subcutaneous Q4H   insulin aspart protamine- aspart  30 Units Subcutaneous BID WC   lidocaine  1 patch Transdermal Q24H   lip balm  1 application Topical BID   pantoprazole  40 mg Oral BID   Continuous Infusions:  dextrose 100 mL/hr at  05/06/21 4098   methocarbamol (ROBAXIN) IV     ondansetron (ZOFRAN) IV     Consultants:see note  Procedures:see note Antimicrobials: Anti-infectives (From admission, onward)    None      Culture/Microbiology    Component Value Date/Time   SDES  12/26/2020 1602    URINE,  RANDOM Performed at [facility], [address] [place], [place] [address]    SPECREQUEST  12/26/2020 1602    NONE Performed at [facility], [address] [place], [place] [address]    CULT 30,000 COLONIES/mL YEAST (A) 12/26/2020 1602   REPTSTATUS 12/28/2020 FINAL 12/26/2020 1602    Other culture-see note  Objective: Vitals: Today's Vitals   05/08/21 2107 05/08/21 2120 05/09/21 0604 05/09/21 0800  BP:  (!) 141/66 (!) 148/71   Pulse:  69 72   Resp:  18 18   Temp:  99.3 F (37.4 C) 98.7 F (37.1 C)   TempSrc:  Oral Oral   SpO2:  96% 94%   Weight:      Height:      PainSc: 0-No pain   Asleep    Intake/Output Summary (Last 24 hours) at 05/09/2021 0844 Last data filed at 05/09/2021 0600 Gross per 24 hour  Intake 2213.77 ml  Output 2300 ml  Net -86.23 ml    Filed Weights   05/01/21 1038  Weight: 119.3 kg   Weight change:   Intake/Output from previous day: 08/25 0701 - 08/26 0700 In: 2213.8 [P.O.:1080; I.V.:400; IV Piggyback:433.8] Out: 2300 [Urine:2300] Intake/Output this shift: No intake/output data recorded. Filed Weights   05/01/21 1038  Weight: 119.3 kg   Examination: General exam: AAOx 3, obese, pleasant older than stated age, weak appearing. HEENT:Oral mucosa moist, Ear/Nose WNL grossly, dentition normal. Respiratory system: bilaterally diminished,, no use of accessory muscle Cardiovascular system: S1 & S2 +, No JVD,. Gastrointestinal system: Abdomen soft,NT,ND, BS+ Nervous System:Alert, awake, moving extremities and grossly nonfocal Extremities: no edema, distal peripheral pulses palpable.  Skin: No rashes,no icterus. MSK: Normal muscle bulk,tone, power   Data Reviewed: I have personally reviewed following labs and imaging studies CBC: Recent Labs  Lab 05/05/21 0931 05/06/21 0419 05/06/21 1901 05/07/21 0846 05/08/21 0419 05/09/21 0431  WBC 7.1 7.8  --  6.8 6.8 7.5  HGB 12.4* 12.2* 12.3* 11.6* 11.6*  11.5*  HCT 37.5* 37.0* 38.8* 35.0* 35.3* 35.6*  MCV 94.0 96.1  --  94.1 94.9 95.2  PLT 189 185  --  196 206 231    Basic Metabolic Panel: Recent Labs  Lab 05/03/21 0442 05/05/21 0931 05/08/21 0419 05/09/21 0431  NA 139 138 133* 136  K 3.4* 3.2* 3.2* 3.8  CL 101 103 102 101  CO2 GLUCOSE 193* 250* 174* 159*  BUN 24* CREATININE 1.34* 1.07 1.05 1.01  CALCIUM 9.4 9.3 9.0 9.1    GFR: Estimated Creatinine Clearance: 89.5 mL/min (by C-G formula based on SCr of 1.01 mg/dL). Liver Function Tests: No results for input(s): AST, ALT, ALKPHOS, BILITOT, PROT, ALBUMIN in the last 168 hours.  No results for input(s): LIPASE, AMYLASE in the last 168 hours.  No results for input(s): AMMONIA in the last 168 hours. Coagulation Profile: No results for input(s): INR, PROTIME in the last 168 hours. Cardiac Enzymes: No results for input(s): CKTOTAL, CKMB, CKMBINDEX, TROPONINI in the last 168 hours. BNP (last 3 results) No results for input(s): PROBNP in the last 8760 hours. HbA1C: No results for input(s):  HGBA1C in the last 72 hours. CBG: Recent Labs  Lab 05/08/21 1619 05/08/21 1950 05/09/21 0034 05/09/21 0430 05/09/21 0809  GLUCAP 123* 130* 146* 138* 162*    Lipid Profile: No results for input(s): CHOL, HDL, LDLCALC, TRIG, CHOLHDL, LDLDIRECT in the last 72 hours. Thyroid Function Tests: No results for input(s): TSH, T4TOTAL, FREET4, T3FREE, THYROIDAB in the last 72 hours. Anemia Panel: No results for input(s): VITAMINB12, FOLATE, FERRITIN, TIBC, IRON, RETICCTPCT in the last 72 hours. Sepsis Labs: No results for input(s): PROCALCITON, LATICACIDVEN in the last 168 hours.  Recent Results (from the past 240 hour(s))  Resp Panel by RT-PCR (Flu A&B, Covid) Nasopharyngeal Swab     Status: None   Collection Time: 05/01/21  2:35 PM   Specimen: Nasopharyngeal Swab; Nasopharyngeal(NP) swabs in vial transport medium  Result Value Ref Range Status   SARS Coronavirus  2 by RT PCR NEGATIVE NEGATIVE Final    Comment: (NOTE) SARS-CoV-2 target nucleic acids are NOT DETECTED.  The SARS-CoV-2 RNA is generally detectable in upper respiratory specimens during the acute phase of infection. The lowest concentration of SARS-CoV-2 viral copies this assay can detect is 138 copies/mL. A negative result does not preclude SARS-Cov-2 infection and should not be used as the sole basis for treatment or other patient management decisions. A negative result may occur with  improper specimen collection/handling, submission of specimen other than nasopharyngeal swab, presence of viral mutation(s) within the areas targeted by this assay, and inadequate number of viral copies(<138 copies/mL). A negative result must be combined with clinical observations, patient history, and epidemiological information. The expected result is Negative.  Fact Sheet for Patients:  BloggerCourse.com  Fact Sheet for Healthcare Providers:  SeriousBroker.it  This test is no t yet approved or cleared by the [place] FDA and  has been authorized for detection and/or diagnosis of SARS-CoV-2 by FDA under an Emergency Use Authorization (EUA). This EUA will remain  in effect (meaning this test can be used) for the duration of the COVID-19 declaration under Section 564(b)(1) of the Act, 21 U.S.C.section 360bbb-3(b)(1), unless the authorization is terminated  or revoked sooner.       Influenza A by PCR NEGATIVE NEGATIVE Final   Influenza B by PCR NEGATIVE NEGATIVE Final    Comment: (NOTE) The Xpert Xpress SARS-CoV-2/FLU/RSV plus assay is intended as an aid in the diagnosis of influenza from Nasopharyngeal swab specimens and should not be used as a sole basis for treatment. Nasal washings and aspirates are unacceptable for Xpert Xpress SARS-CoV-2/FLU/RSV testing.  Fact Sheet for Patients: BloggerCourse.com  Fact  Sheet for Healthcare Providers: SeriousBroker.it  This test is not yet approved or cleared by the [place] FDA and has been authorized for detection and/or diagnosis of SARS-CoV-2 by FDA under an Emergency Use Authorization (EUA). This EUA will remain in effect (meaning this test can be used) for the duration of the COVID-19 declaration under Section 564(b)(1) of the Act, 21 U.S.C. section 360bbb-3(b)(1), unless the authorization is terminated or revoked.  Performed at [facility], [address] [place], [place] [address]   Resp Panel by RT-PCR (Flu A&B, Covid) Nasopharyngeal Swab     Status: None   Collection Time: 05/06/21 10:44 AM   Specimen: Nasopharyngeal Swab; Nasopharyngeal(NP) swabs in vial transport medium  Result Value Ref Range Status   SARS Coronavirus 2 by RT PCR NEGATIVE NEGATIVE Final    Comment: (NOTE) SARS-CoV-2 target nucleic acids are NOT DETECTED.  The SARS-CoV-2 RNA is generally  detectable in upper respiratory specimens during the acute phase of infection. The lowest concentration of SARS-CoV-2 viral copies this assay can detect is 138 copies/mL. A negative result does not preclude SARS-Cov-2 infection and should not be used as the sole basis for treatment or other patient management decisions. A negative result may occur with  improper specimen collection/handling, submission of specimen other than nasopharyngeal swab, presence of viral mutation(s) within the areas targeted by this assay, and inadequate number of viral copies(<138 copies/mL). A negative result must be combined with clinical observations, patient history, and epidemiological information. The expected result is Negative.  Fact Sheet for Patients:  BloggerCourse.comhttps://www.fda.gov/media/152166/download  Fact Sheet for Healthcare Providers:  SeriousBroker.ithttps://www.fda.gov/media/152162/download  This test is no t yet approved or cleared by the [place] FDA and  has  been authorized for detection and/or diagnosis of SARS-CoV-2 by FDA under an Emergency Use Authorization (EUA). This EUA will remain  in effect (meaning this test can be used) for the duration of the COVID-19 declaration under Section 564(b)(1) of the Act, 21 U.S.C.section 360bbb-3(b)(1), unless the authorization is terminated  or revoked sooner.       Influenza A by PCR NEGATIVE NEGATIVE Final   Influenza B by PCR NEGATIVE NEGATIVE Final    Comment: (NOTE) The Xpert Xpress SARS-CoV-2/FLU/RSV plus assay is intended as an aid in the diagnosis of influenza from Nasopharyngeal swab specimens and should not be used as a sole basis for treatment. Nasal washings and aspirates are unacceptable for Xpert Xpress SARS-CoV-2/FLU/RSV testing.  Fact Sheet for Patients: BloggerCourse.comhttps://www.fda.gov/media/152166/download  Fact Sheet for Healthcare Providers: SeriousBroker.ithttps://www.fda.gov/media/152162/download  This test is not yet approved or cleared by the [place] FDA and has been authorized for detection and/or diagnosis of SARS-CoV-2 by FDA under an Emergency Use Authorization (EUA). This EUA will remain in effect (meaning this test can be used) for the duration of the COVID-19 declaration under Section 564(b)(1) of the Act, 21 U.S.C. section 360bbb-3(b)(1), unless the authorization is terminated or revoked.  Performed at [facility], [address] [place], [place] [id number]       Radiology Studies: No results found.   LOS: 8 days   Lanae Boastamesh Vickee Mormino, MD [facility]  05/09/2021, 8:44 AM

## 2021-05-09 NOTE — Progress Notes (Signed)
PT Cancellation Note  Patient Details Name: Randy Liu MRN: [id number] DOB: October 18, 1950   Cancelled Treatment:     family stated pt was "leaving today" and pt stated he wanted to "rest" in prep for transportation.   Felecia Shelling  PTA Acute  Rehabilitation Services Pager      [phone] Office      [phone]

## 2021-05-09 NOTE — TOC Transition Note (Signed)
Transition of Care [facility]) - CM/SW Discharge Note   Patient Details  Name: Randy Liu MRN: [id number] Date of Birth: 1951-07-28  Transition of Care [facility]) CM/SW Contact:  Amada Jupiter, LCSW Phone Number: 05/09/2021, 10:57 AM   Clinical Narrative:    Pt medically cleared for dc to [facility] today.  Pt and wife pleased and PTAR call at 1:10pm.  RN to call report to [phone].  No further TOC needs.   Final next level of care: Skilled Nursing Facility Barriers to Discharge: Barriers Resolved   Patient Goals and CMS Choice Patient states their goals for this hospitalization and ongoing recovery are:: go home CMS Medicare.gov Compare Post Acute Care list provided to:: Patient Represenative (must comment) ([name] spouse [phone]) Choice offered to / list presented to : Spouse  Discharge Placement   Existing PASRR number confirmed : 05/05/21          Patient chooses bed at: [facility] at [facility] Patient to be transferred to facility by: PTAR Name of family member notified: spouse Patient and family notified of of transfer: 05/09/21  Discharge Plan and Services   Discharge Planning Services: CM Consult            DME Arranged: N/A DME Agency: NA         HH Agency: [facility] Date [facility] Agency Contacted: 05/02/21 Time HH Agency Contacted: 1014 Representative spoke with at [facility] Agency: Marylene Land  Social Determinants of Health (SDOH) Interventions     Readmission Risk Interventions No flowsheet data found.

## 2021-05-09 NOTE — Progress Notes (Signed)
Report called to Randy Liu at [facility], instructed that pt has already received his pm insulin but has not yet eaten. They state that they will feed pt when they receive him. Transported via PTAR w d/c packet in stable condition.

## 2021-08-09 ENCOUNTER — Observation Stay (HOSPITAL_COMMUNITY): Payer: Medicare Other

## 2021-08-09 ENCOUNTER — Emergency Department (HOSPITAL_BASED_OUTPATIENT_CLINIC_OR_DEPARTMENT_OTHER): Payer: Medicare Other

## 2021-08-09 ENCOUNTER — Inpatient Hospital Stay (HOSPITAL_BASED_OUTPATIENT_CLINIC_OR_DEPARTMENT_OTHER)
Admission: EM | Admit: 2021-08-09 | Discharge: 2021-08-13 | DRG: 281 | Disposition: A | Discharge status: Home Health | Payer: Medicare Other | Attending: Family Medicine | Admitting: Family Medicine

## 2021-08-09 ENCOUNTER — Encounter (HOSPITAL_BASED_OUTPATIENT_CLINIC_OR_DEPARTMENT_OTHER): Payer: Self-pay | Admitting: Emergency Medicine

## 2021-08-09 DIAGNOSIS — I5032 Chronic diastolic (congestive) heart failure: Secondary | ICD-10-CM | POA: Diagnosis not present

## 2021-08-09 DIAGNOSIS — E1142 Type 2 diabetes mellitus with diabetic polyneuropathy: Secondary | ICD-10-CM | POA: Diagnosis present

## 2021-08-09 DIAGNOSIS — K567 Ileus, unspecified: Secondary | ICD-10-CM | POA: Diagnosis present

## 2021-08-09 DIAGNOSIS — Z7901 Long term (current) use of anticoagulants: Secondary | ICD-10-CM

## 2021-08-09 DIAGNOSIS — I129 Hypertensive chronic kidney disease with stage 1 through stage 4 chronic kidney disease, or unspecified chronic kidney disease: Secondary | ICD-10-CM | POA: Diagnosis not present

## 2021-08-09 DIAGNOSIS — K56600 Partial intestinal obstruction, unspecified as to cause: Secondary | ICD-10-CM | POA: Diagnosis present

## 2021-08-09 DIAGNOSIS — Z880 Allergy status to penicillin: Secondary | ICD-10-CM

## 2021-08-09 DIAGNOSIS — N1831 Chronic kidney disease, stage 3a: Secondary | ICD-10-CM | POA: Diagnosis present

## 2021-08-09 DIAGNOSIS — E781 Pure hyperglyceridemia: Secondary | ICD-10-CM | POA: Diagnosis present

## 2021-08-09 DIAGNOSIS — G4733 Obstructive sleep apnea (adult) (pediatric): Secondary | ICD-10-CM | POA: Diagnosis not present

## 2021-08-09 DIAGNOSIS — E7800 Pure hypercholesterolemia, unspecified: Secondary | ICD-10-CM | POA: Diagnosis not present

## 2021-08-09 DIAGNOSIS — Z789 Other specified health status: Secondary | ICD-10-CM

## 2021-08-09 DIAGNOSIS — Z888 Allergy status to other drugs, medicaments and biological substances status: Secondary | ICD-10-CM

## 2021-08-09 DIAGNOSIS — E1169 Type 2 diabetes mellitus with other specified complication: Secondary | ICD-10-CM | POA: Diagnosis present

## 2021-08-09 DIAGNOSIS — Z86711 Personal history of pulmonary embolism: Secondary | ICD-10-CM

## 2021-08-09 DIAGNOSIS — R112 Nausea with vomiting, unspecified: Secondary | ICD-10-CM

## 2021-08-09 DIAGNOSIS — Z886 Allergy status to analgesic agent status: Secondary | ICD-10-CM

## 2021-08-09 DIAGNOSIS — E785 Hyperlipidemia, unspecified: Secondary | ICD-10-CM

## 2021-08-09 DIAGNOSIS — Z87891 Personal history of nicotine dependence: Secondary | ICD-10-CM

## 2021-08-09 DIAGNOSIS — R778 Other specified abnormalities of plasma proteins: Secondary | ICD-10-CM

## 2021-08-09 DIAGNOSIS — Z8249 Family history of ischemic heart disease and other diseases of the circulatory system: Secondary | ICD-10-CM

## 2021-08-09 DIAGNOSIS — K219 Gastro-esophageal reflux disease without esophagitis: Secondary | ICD-10-CM | POA: Diagnosis not present

## 2021-08-09 DIAGNOSIS — I2510 Atherosclerotic heart disease of native coronary artery without angina pectoris: Secondary | ICD-10-CM

## 2021-08-09 DIAGNOSIS — Z794 Long term (current) use of insulin: Secondary | ICD-10-CM

## 2021-08-09 DIAGNOSIS — J45909 Unspecified asthma, uncomplicated: Secondary | ICD-10-CM | POA: Diagnosis present

## 2021-08-09 DIAGNOSIS — Z993 Dependence on wheelchair: Secondary | ICD-10-CM

## 2021-08-09 DIAGNOSIS — I25110 Atherosclerotic heart disease of native coronary artery with unstable angina pectoris: Secondary | ICD-10-CM | POA: Diagnosis present

## 2021-08-09 DIAGNOSIS — Z20822 Contact with and (suspected) exposure to covid-19: Secondary | ICD-10-CM | POA: Diagnosis not present

## 2021-08-09 DIAGNOSIS — I429 Cardiomyopathy, unspecified: Secondary | ICD-10-CM | POA: Diagnosis present

## 2021-08-09 DIAGNOSIS — E1122 Type 2 diabetes mellitus with diabetic chronic kidney disease: Secondary | ICD-10-CM | POA: Diagnosis present

## 2021-08-09 DIAGNOSIS — I130 Hypertensive heart and chronic kidney disease with heart failure and stage 1 through stage 4 chronic kidney disease, or unspecified chronic kidney disease: Secondary | ICD-10-CM | POA: Diagnosis present

## 2021-08-09 DIAGNOSIS — Z7401 Bed confinement status: Secondary | ICD-10-CM

## 2021-08-09 DIAGNOSIS — R109 Unspecified abdominal pain: Secondary | ICD-10-CM

## 2021-08-09 DIAGNOSIS — K56609 Unspecified intestinal obstruction, unspecified as to partial versus complete obstruction: Secondary | ICD-10-CM

## 2021-08-09 DIAGNOSIS — I214 Non-ST elevation (NSTEMI) myocardial infarction: Principal | ICD-10-CM | POA: Diagnosis present

## 2021-08-09 DIAGNOSIS — E1165 Type 2 diabetes mellitus with hyperglycemia: Secondary | ICD-10-CM | POA: Diagnosis not present

## 2021-08-09 DIAGNOSIS — Z86718 Personal history of other venous thrombosis and embolism: Secondary | ICD-10-CM

## 2021-08-09 DIAGNOSIS — E6609 Other obesity due to excess calories: Secondary | ICD-10-CM | POA: Diagnosis present

## 2021-08-09 DIAGNOSIS — N179 Acute kidney failure, unspecified: Secondary | ICD-10-CM | POA: Diagnosis present

## 2021-08-09 DIAGNOSIS — Z79899 Other long term (current) drug therapy: Secondary | ICD-10-CM

## 2021-08-09 LAB — COMPREHENSIVE METABOLIC PANEL
ALT: 21 U/L (ref 0–44)
AST: 23 U/L (ref 15–41)
Albumin: 3.3 g/dL — ABNORMAL LOW (ref 3.5–5.0)
Alkaline Phosphatase: 42 U/L (ref 38–126)
Anion gap: 9 (ref 5–15)
BUN: 33 mg/dL — ABNORMAL HIGH (ref 8–23)
CO2: 27 mmol/L (ref 22–32)
Calcium: 9.3 mg/dL (ref 8.9–10.3)
Chloride: 100 mmol/L (ref 98–111)
Creatinine, Ser: 1.28 mg/dL — ABNORMAL HIGH (ref 0.61–1.24)
GFR, Estimated: 59 mL/min — ABNORMAL LOW (ref >60)
Glucose, Bld: 213 mg/dL — ABNORMAL HIGH (ref 70–99)
Potassium: 4.3 mmol/L (ref 3.5–5.1)
Sodium: 136 mmol/L (ref 135–145)
Total Bilirubin: 0.4 mg/dL (ref 0.3–1.2)
Total Protein: 7.1 g/dL (ref 6.5–8.1)

## 2021-08-09 LAB — CBC WITH DIFFERENTIAL/PLATELET
Abs Immature Granulocytes: 0.05 10*3/uL (ref 0.00–0.07)
Basophils Absolute: 0 10*3/uL (ref 0.0–0.1)
Basophils Relative: 1 %
Eosinophils Absolute: 0.2 10*3/uL (ref 0.0–0.5)
Eosinophils Relative: 3 %
HCT: 41 % (ref 39.0–52.0)
Hemoglobin: 13.4 g/dL (ref 13.0–17.0)
Immature Granulocytes: 1 %
Lymphocytes Relative: 27 %
Lymphs Abs: 1.9 10*3/uL (ref 0.7–4.0)
MCH: 30.9 pg (ref 26.0–34.0)
MCHC: 32.7 g/dL (ref 30.0–36.0)
MCV: 94.5 fL (ref 80.0–100.0)
Monocytes Absolute: 0.6 10*3/uL (ref 0.1–1.0)
Monocytes Relative: 8 %
Neutro Abs: 4.3 10*3/uL (ref 1.7–7.7)
Neutrophils Relative %: 60 %
Platelets: 271 10*3/uL (ref 150–400)
RBC: 4.34 MIL/uL (ref 4.22–5.81)
RDW: 15.4 % (ref 11.5–15.5)
WBC: 7.1 10*3/uL (ref 4.0–10.5)
nRBC: 0 % (ref 0.0–0.2)

## 2021-08-09 LAB — GLUCOSE, CAPILLARY
Glucose-Capillary: 198 mg/dL — ABNORMAL HIGH (ref 70–99)
Glucose-Capillary: 173 mg/dL — ABNORMAL HIGH (ref 70–99)

## 2021-08-09 LAB — URINALYSIS, ROUTINE W REFLEX MICROSCOPIC
Bilirubin Urine: NEGATIVE
Glucose, UA: NEGATIVE mg/dL
Hgb urine dipstick: NEGATIVE
Ketones, ur: NEGATIVE mg/dL
Leukocytes,Ua: NEGATIVE
Nitrite: NEGATIVE
Protein, ur: NEGATIVE mg/dL
Specific Gravity, Urine: 1.02 (ref 1.005–1.030)
pH: 5 (ref 5.0–8.0)

## 2021-08-09 LAB — CBG MONITORING, ED: Glucose-Capillary: 202 mg/dL — ABNORMAL HIGH (ref 70–99)

## 2021-08-09 LAB — PROTIME-INR
INR: 1.2 (ref 0.8–1.2)
Prothrombin Time: 14.7 seconds (ref 11.4–15.2)

## 2021-08-09 LAB — TROPONIN I (HIGH SENSITIVITY)
Troponin I (High Sensitivity): 46 ng/L — ABNORMAL HIGH (ref <18)
Troponin I (High Sensitivity): 86 ng/L — ABNORMAL HIGH (ref <18)
Troponin I (High Sensitivity): 190 ng/L (ref <18)
Troponin I (High Sensitivity): 234 ng/L (ref <18)
Troponin I (High Sensitivity): 280 ng/L (ref <18)

## 2021-08-09 LAB — BRAIN NATRIURETIC PEPTIDE: B Natriuretic Peptide: 138.4 pg/mL — ABNORMAL HIGH (ref 0.0–100.0)

## 2021-08-09 LAB — APTT: aPTT: 29 seconds (ref 24–36)

## 2021-08-09 LAB — RESP PANEL BY RT-PCR (FLU A&B, COVID) ARPGX2
Influenza A by PCR: NEGATIVE
Influenza B by PCR: NEGATIVE
SARS Coronavirus 2 by RT PCR: NEGATIVE

## 2021-08-09 LAB — LIPASE, BLOOD: Lipase: 29 U/L (ref 11–51)

## 2021-08-09 LAB — HEPARIN LEVEL (UNFRACTIONATED): Heparin Unfractionated: >1.1 IU/mL — ABNORMAL HIGH (ref 0.30–0.70)

## 2021-08-09 MED ORDER — MECLIZINE HCL 25 MG PO TABS
25.0000 mg | ORAL_TABLET | Freq: Three times a day (TID) | ORAL | Status: DC | PRN
  Administered 2021-08-13: 25 mg via ORAL
  Filled 2021-08-09: qty 1

## 2021-08-09 MED ORDER — EZETIMIBE 10 MG PO TABS
10.0000 mg | ORAL_TABLET | Freq: Every day | ORAL | Status: DC
  Administered 2021-08-09 – 2021-08-13 (×4): 10 mg via ORAL
  Filled 2021-08-09 (×4): qty 1

## 2021-08-09 MED ORDER — DOXYCYCLINE HYCLATE 100 MG PO TABS
100.0000 mg | ORAL_TABLET | Freq: Two times a day (BID) | ORAL | Status: AC
  Administered 2021-08-09 – 2021-08-10 (×3): 100 mg via ORAL
  Filled 2021-08-09 (×4): qty 1

## 2021-08-09 MED ORDER — ONDANSETRON HCL 4 MG/2ML IJ SOLN
4.0000 mg | Freq: Once | INTRAMUSCULAR | Status: AC
  Administered 2021-08-09: 4 mg via INTRAVENOUS
  Filled 2021-08-09: qty 2

## 2021-08-09 MED ORDER — ACETAMINOPHEN 325 MG PO TABS: 650.0000 mg | ORAL_TABLET | ORAL | Status: DC | PRN

## 2021-08-09 MED ORDER — PHENOL 1.4 % MT LIQD
1.0000 | OROMUCOSAL | Status: DC | PRN
  Filled 2021-08-09: qty 177

## 2021-08-09 MED ORDER — IOHEXOL 300 MG/ML  SOLN
100.0000 mL | Freq: Once | INTRAMUSCULAR | Status: AC | PRN
  Administered 2021-08-09: 100 mL via INTRAVENOUS

## 2021-08-09 MED ORDER — BISACODYL 10 MG RE SUPP
10.0000 mg | Freq: Every day | RECTAL | Status: DC | PRN
  Administered 2021-08-13: 10 mg via RECTAL
  Filled 2021-08-09: qty 1

## 2021-08-09 MED ORDER — MONTELUKAST SODIUM 10 MG PO TABS
10.0000 mg | ORAL_TABLET | Freq: Every day | ORAL | Status: DC
  Administered 2021-08-09 – 2021-08-12 (×4): 10 mg via ORAL
  Filled 2021-08-09 (×4): qty 1

## 2021-08-09 MED ORDER — LATANOPROST 0.005 % OP SOLN
1.0000 [drp] | Freq: Every day | OPHTHALMIC | Status: DC
  Administered 2021-08-09 – 2021-08-12 (×4): 1 [drp] via OPHTHALMIC
  Filled 2021-08-09: qty 2.5

## 2021-08-09 MED ORDER — FLUTICASONE PROPIONATE 50 MCG/ACT NA SUSP
1.0000 | Freq: Every day | NASAL | Status: DC
  Administered 2021-08-10 – 2021-08-13 (×3): 1 via NASAL
  Filled 2021-08-09: qty 16

## 2021-08-09 MED ORDER — CARVEDILOL 12.5 MG PO TABS
12.5000 mg | ORAL_TABLET | Freq: Two times a day (BID) | ORAL | Status: DC
  Administered 2021-08-09 – 2021-08-13 (×7): 12.5 mg via ORAL
  Filled 2021-08-09 (×7): qty 1

## 2021-08-09 MED ORDER — LACTATED RINGERS IV BOLUS
1000.0000 mL | Freq: Once | INTRAVENOUS | Status: AC
  Administered 2021-08-09: 1000 mL via INTRAVENOUS

## 2021-08-09 MED ORDER — LORAZEPAM 1 MG PO TABS
0.5000 mg | ORAL_TABLET | Freq: Once | ORAL | Status: AC
  Administered 2021-08-09: 0.5 mg via SUBLINGUAL
  Filled 2021-08-09: qty 1

## 2021-08-09 MED ORDER — HEPARIN BOLUS VIA INFUSION
3250.0000 [IU] | Freq: Once | INTRAVENOUS | Status: AC
  Administered 2021-08-09: 3250 [IU] via INTRAVENOUS
  Filled 2021-08-09: qty 3250

## 2021-08-09 MED ORDER — INSULIN ASPART 100 UNIT/ML IJ SOLN
0.0000 [IU] | Freq: Three times a day (TID) | INTRAMUSCULAR | Status: DC
  Administered 2021-08-09 – 2021-08-10 (×2): 3 [IU] via SUBCUTANEOUS
  Administered 2021-08-10: 13:00:00 5 [IU] via SUBCUTANEOUS
  Administered 2021-08-10 – 2021-08-12 (×7): 3 [IU] via SUBCUTANEOUS
  Administered 2021-08-13: 5 [IU] via SUBCUTANEOUS

## 2021-08-09 MED ORDER — PANTOPRAZOLE SODIUM 40 MG PO TBEC
40.0000 mg | DELAYED_RELEASE_TABLET | Freq: Every day | ORAL | Status: DC
  Administered 2021-08-10 – 2021-08-13 (×3): 40 mg via ORAL
  Filled 2021-08-09 (×3): qty 1

## 2021-08-09 MED ORDER — HEPARIN (PORCINE) 25000 UT/250ML-% IV SOLN
1800.0000 [IU]/h | INTRAVENOUS | Status: DC
  Administered 2021-08-09: 18:00:00 1500 [IU]/h via INTRAVENOUS
  Administered 2021-08-10: 06:00:00 1800 [IU]/h via INTRAVENOUS
  Filled 2021-08-09 (×2): qty 250

## 2021-08-09 MED ORDER — METOCLOPRAMIDE HCL 5 MG PO TABS
2.5000 mg | ORAL_TABLET | Freq: Every day | ORAL | Status: DC
  Administered 2021-08-09 – 2021-08-12 (×4): 2.5 mg via ORAL
  Filled 2021-08-09 (×4): qty 1

## 2021-08-09 MED ORDER — SODIUM CHLORIDE (HYPERTONIC) 5 % OP OINT
1.0000 "application " | TOPICAL_OINTMENT | Freq: Every day | OPHTHALMIC | Status: DC
  Administered 2021-08-10 – 2021-08-11 (×2): 1 via OPHTHALMIC
  Filled 2021-08-09 (×2): qty 3.5

## 2021-08-09 MED ORDER — COLCHICINE-PROBENECID 0.5-500 MG PO TABS: 1.0000 | ORAL_TABLET | Freq: Every day | ORAL | Status: DC

## 2021-08-09 MED ORDER — FENOFIBRATE 160 MG PO TABS
160.0000 mg | ORAL_TABLET | Freq: Every day | ORAL | Status: DC
  Administered 2021-08-09 – 2021-08-13 (×4): 160 mg via ORAL
  Filled 2021-08-09 (×4): qty 1

## 2021-08-09 MED ORDER — INSULIN ASPART 100 UNIT/ML IJ SOLN
0.0000 [IU] | Freq: Every day | INTRAMUSCULAR | Status: DC
  Administered 2021-08-12: 2 [IU] via SUBCUTANEOUS

## 2021-08-09 MED ORDER — POLYVINYL ALCOHOL 1.4 % OP SOLN: 1.0000 [drp] | Freq: Every day | OPHTHALMIC | Status: DC | PRN

## 2021-08-09 MED ORDER — ASPIRIN 325 MG PO TABS
325.0000 mg | ORAL_TABLET | Freq: Every day | ORAL | Status: DC
  Administered 2021-08-09 – 2021-08-10 (×2): 325 mg via ORAL
  Filled 2021-08-09 (×2): qty 1

## 2021-08-09 MED ORDER — ONDANSETRON HCL 4 MG/2ML IJ SOLN
4.0000 mg | Freq: Four times a day (QID) | INTRAMUSCULAR | Status: DC | PRN
  Administered 2021-08-11 (×2): 4 mg via INTRAVENOUS
  Filled 2021-08-09 (×3): qty 2

## 2021-08-09 NOTE — ED Provider Notes (Signed)
[facility] EMERGENCY DEPARTMENT Provider Note   CSN: [id number] Arrival date & time: 08/09/21  0735     History Chief Complaint  Patient presents with   Emesis    Randy Liu is a 70 y.o. male.   Emesis Associated symptoms: chills   Associated symptoms: no abdominal pain and no diarrhea     70 y.o. male with a PMHx of  HTN, CKDIII, Hx of pulmonary embolus on anticoag, OSA, Diabetic gastroparesis, DMII with neuropathy and LT insulin, Restrictive Lung disease, IBS, hepatic stenosis, cholangitis, GERD, pancreatic lesion, thyroid nodules, hepatic stenosis, gout, B12 deficiency, POAG,  Vitamin D deficiency, morbid obesity presenting to the ED with nausea and vomiting that began at 2300 last night.  The patient last had a bowel movement on Wednesday.  He states he cannot member when he last passed gas.  He developed nausea and vomiting last night with 2 episodes of NBNB emesis that was yellow in color.  This morning, the color of his emesis x1 turned green.  He does have a history of bowel obstruction and this is happened before when he had a bowel obstruction.  He called his physician and was recommended to go to the emergency department for further evaluation to rule out bowel obstruction.  He endorses some nausea.  He took some Zofran orally this morning but subsequently threw it up.  Additionally, he developed some substernal chest discomfort, described as a pressure sensation that radiated down his left arm earlier today which is since resolved.  Past Medical History:  Diagnosis Date   Asthma    CHF (congestive heart failure) ([place])    Diabetes mellitus without complication ([place])    Hypertension     Patient Active Problem List   Diagnosis Date Noted   Ileus ([place]) 08/09/2021   Hematochezia 05/07/2021   Small bowel obstruction ([place]) 05/01/2021   SBO (small bowel obstruction) ([place]) 05/01/2021   Yeast infection 12/15/2020   Elevated LFTs 12/15/2020   Acute  hypoxemic respiratory failure ([place]) 12/13/2020   Hyponatremia 12/13/2020   Asthma 12/13/2020   Diabetes ([place]) 12/13/2020   Acute cholangitis 12/12/2020    Past Surgical History:  Procedure Laterality Date   BIOPSY  12/14/2020   Procedure: BIOPSY;  Surgeon: Jackquline Denmark, MD;  Location: [facility];  Service: Endoscopy;;   CHOLECYSTECTOMY N/A 12/17/2020   Procedure: LAPAROSCOPIC CHOLECYSTECTOMY, LIVER BIOPSY, PRIMARY UMBILICAL HERNIA REPAIR;  Surgeon: Armandina Gemma, MD;  Location: [facility] ORS;  Service: General;  Laterality: N/A;   COLONOSCOPY WITH PROPOFOL N/A 05/08/2021   Procedure: COLONOSCOPY WITH PROPOFOL;  Surgeon: Carol Ada, MD;  Location: [facility];  Service: Endoscopy;  Laterality: N/A;   ENDOSCOPIC RETROGRADE CHOLANGIOPANCREATOGRAPHY (ERCP) WITH PROPOFOL N/A 12/14/2020   Procedure: ENDOSCOPIC RETROGRADE CHOLANGIOPANCREATOGRAPHY (ERCP) WITH PROPOFOL;  Surgeon: Jackquline Denmark, MD;  Location: [facility];  Service: Endoscopy;  Laterality: N/A;   POLYPECTOMY  05/08/2021   Procedure: POLYPECTOMY;  Surgeon: Carol Ada, MD;  Location: [facility];  Service: Endoscopy;;   REMOVAL OF STONES  12/14/2020   Procedure: REMOVAL OF STONES;  Surgeon: Jackquline Denmark, MD;  Location: [facility];  Service: Endoscopy;;   SPHINCTEROTOMY  12/14/2020   Procedure: Joan Mayans;  Surgeon: Jackquline Denmark, MD;  Location: [facility];  Service: Endoscopy;;       History reviewed. No pertinent family history.  Social History   Tobacco Use   Smoking status: Former   Smokeless tobacco: Never  Substance Use Topics   Alcohol use: Not Currently   Drug use: Never  Home Medications Prior to Admission medications   Medication Sig Start Date End Date Taking? Authorizing Provider  acetaminophen (TYLENOL) 500 MG tablet Take 1,000 mg by mouth every 8 (eight) hours as needed for moderate pain.   Yes [provider]  Alpha-Lipoic Acid 300 MG TABS Take 600 mg by mouth daily.   Yes [provider]  benzonatate (TESSALON) 200 MG capsule Take 200 mg by mouth every 8 (eight) hours as needed for cough. 11/18/20  Yes [provider]  bisacodyl (DULCOLAX) 10 MG suppository Place 1 suppository (10 mg total) rectally daily as needed for up to 10 doses for moderate constipation. 05/06/21  Yes Antonieta Pert, MD  carvedilol (COREG) 12.5 MG tablet Take 12.5 mg by mouth 2 (two) times daily with a meal. 06/12/21  Yes [provider]  Cholecalciferol 25 MCG (1000 UT) capsule Take 4,000 Units by mouth daily.   Yes [provider]  colchicine-probenecid 0.5-500 MG tablet Take 1 tablet by mouth daily. 11/21/20  Yes [provider]  cyanocobalamin (,VITAMIN B-12,) 1000 MCG/ML injection Inject 1,000 mcg into the skin every 30 (thirty) days. 09/23/20  Yes [provider]  doxycycline (VIBRA-TABS) 100 MG tablet Take 100 mg by mouth 2 (two) times daily. 08/04/21  Yes [provider]  ELIQUIS 5 MG TABS tablet Take 1 tablet (5 mg total) by mouth 2 (two) times daily. Resume on 05/11/21 Patient taking differently: Take 5 mg by mouth 2 (two) times daily. 05/11/21  Yes Antonieta Pert, MD  fenofibrate 160 MG tablet Take 160 mg by mouth daily. 12/04/20  Yes [provider]  fluticasone (FLONASE) 50 MCG/ACT nasal spray Place 1 spray into both nostrils daily. 07/08/21  Yes [provider]  guaiFENesin (MUCINEX) 600 MG 12 hr tablet Take 600 mg by mouth 2 (two) times daily as needed for cough.   Yes [provider]  lansoprazole (PREVACID) 30 MG capsule Take 30 mg by mouth in the morning and at bedtime. 12/02/20  Yes [provider]  latanoprost (XALATAN) 0.005 % ophthalmic solution Place 1 drop into the left eye at bedtime. 10/29/20  Yes [provider]  magnesium oxide (MAG-OX) 400 MG tablet Take 400 mg by mouth 2 (two) times daily. 10/09/20  Yes [provider]  meclizine (ANTIVERT) 25 MG tablet Take 25 mg by mouth 3  (three) times daily as needed for nausea.   Yes [provider]  metoCLOPramide (REGLAN) 5 MG tablet Take 2.5 mg by mouth at bedtime. 10/09/20  Yes [provider]  montelukast (SINGULAIR) 10 MG tablet Take 10 mg by mouth at bedtime. 09/28/20  Yes [provider]  NOVOLOG MIX 70/30 FLEXPEN (70-30) 100 UNIT/ML FlexPen Inject 30 Units into the skin 2 (two) times daily with a meal. Patient taking differently: Inject 40-85 Units into the skin 2 (two) times daily with a meal. Sliding scale 05/09/21  Yes Kc, Ramesh, MD  ondansetron (ZOFRAN) 8 MG tablet Take 8 mg by mouth every 8 (eight) hours as needed for nausea or vomiting.   Yes [provider]  Polyethylene Glycol 400 (BLINK TEARS) 0.25 % SOLN Place 1 drop into both eyes daily as needed (dry eyes).   Yes [provider]  PROAIR RESPICLICK 123XX123 (90 Base) MCG/ACT AEPB Inhale 2 puffs into the lungs every 6 (six) hours as needed (sob/wheezing). 04/03/21  Yes [provider]  Probiotic Product (ALIGN) 4 MG CAPS Take 4 mg by mouth daily.   Yes [provider]  sodium chloride (MURO 128) 5 % ophthalmic ointment Place 1 application into both eyes at bedtime.   Yes [provider]  carvedilol (COREG) 6.25 MG tablet Take 1 tablet (6.25 mg total) by mouth 2 (two) times daily with a meal. Patient not taking: Reported on 08/09/2021 05/09/21   Antonieta Pert, MD    Allergies    Metoclopramide, Nsaids, Amoxicillin-pot clavulanate, Statins, Allopurinol, Empagliflozin, and Tiotropium  Review of Systems   Review of Systems  Constitutional:  Positive for chills.  Cardiovascular:  Positive for chest pain.  Gastrointestinal:  Positive for nausea and vomiting. Negative for abdominal pain and diarrhea.  All other systems reviewed and are negative.  Physical Exam Updated Vital Signs BP (!) 142/71 (BP Location: Left Arm)   Pulse 71   Temp 97.9 F (36.6 C) (Oral)   Resp 20   SpO2 96%   Physical  Exam Vitals and nursing note reviewed.  Constitutional:      General: He is not in acute distress.    Appearance: He is well-developed.  HENT:     Head: Normocephalic and atraumatic.  Eyes:     Conjunctiva/sclera: Conjunctivae normal.     Pupils: Pupils are equal, round, and reactive to light.  Cardiovascular:     Rate and Rhythm: Normal rate and regular rhythm.     Heart sounds: No murmur heard. Pulmonary:     Effort: Pulmonary effort is normal. No respiratory distress.     Breath sounds: Normal breath sounds.  Abdominal:     General: There is no distension.     Palpations: Abdomen is soft.     Tenderness: There is no abdominal tenderness. There is no guarding.  Musculoskeletal:        General: No swelling, deformity or signs of injury.     Cervical back: Neck supple.  Skin:    General: Skin is warm and dry.     Capillary Refill: Capillary refill takes less than 2 seconds.     Findings: No lesion or rash.  Neurological:     General: No focal deficit present.     Mental Status: He is alert. Mental status is at baseline.  Psychiatric:        Mood and Affect: Mood normal.    ED Results / Procedures / Treatments   Labs (all labs ordered are listed, but only abnormal results are displayed) Labs Reviewed  COMPREHENSIVE METABOLIC PANEL - Abnormal; Notable for the following components:      Result Value   Glucose, Bld 213 (*)    BUN 33 (*)    Creatinine, Ser 1.28 (*)    Albumin 3.3 (*)    GFR, Estimated 59 (*)    All other components within normal limits  CBG MONITORING, ED - Abnormal; Notable for the following components:   Glucose-Capillary 202 (*)    All other components within normal limits  TROPONIN I (HIGH SENSITIVITY) - Abnormal; Notable for the following components:   Troponin I (High Sensitivity) 46 (*)    All other components within normal limits  TROPONIN I (HIGH SENSITIVITY) - Abnormal; Notable for the following components:   Troponin I (High Sensitivity) 86  (*)    All other components within normal limits  TROPONIN I (HIGH SENSITIVITY) - Abnormal; Notable for the following components:   Troponin I (High Sensitivity) 190 (*)    All other components within normal limits  RESP PANEL BY RT-PCR (FLU A&B, COVID) ARPGX2  CBC WITH DIFFERENTIAL/PLATELET  LIPASE, BLOOD  URINALYSIS, ROUTINE W REFLEX MICROSCOPIC  BRAIN NATRIURETIC PEPTIDE  TROPONIN I (HIGH SENSITIVITY)  TROPONIN I (HIGH SENSITIVITY)    EKG EKG Interpretation  Date/Time:  Saturday August 09 2021 07:46:36 EST Ventricular Rate:  66 PR Interval:  168 QRS Duration: 109 QT Interval:  437 QTC Calculation: 458 R Axis:   -59 Text Interpretation: Sinus rhythm Left anterior fascicular block Abnormal R-wave progression, late transition Nonspecific T abnormalities, lateral leads No significant change since last tracing Confirmed by Regan Lemming ([id number]) on 08/09/2021 7:59:12 AM  Radiology CT Abdomen Pelvis W Contrast  Result Date: 08/09/2021 CLINICAL DATA:  Abdominal pain EXAM: CT ABDOMEN AND PELVIS WITH CONTRAST TECHNIQUE: Multidetector CT imaging of the abdomen and pelvis was performed using the standard protocol following bolus administration of intravenous contrast. CONTRAST:  150mL OMNIPAQUE IOHEXOL 300 MG/ML  SOLN COMPARISON:  05/01/2021 FINDINGS: Lower chest: Small linear densities in the lower lung fields may suggest scarring or subsegmental atelectasis. Small pericardial effusion is present. Hepatobiliary: No focal abnormality is seen in the liver. Surgical clips are seen in gallbladder fossa. There is no dilation of bile ducts. Pancreas: No focal abnormality is seen. Spleen: Unremarkable. Adrenals/Urinary Tract: Adrenals are unremarkable. There is no hydronephrosis. There is 7.9 cm smooth marginated low-density lesion in the lower pole of left kidney suggesting renal cysts. There is small 2.1 cm fluid density structure in the margin of lower pole of right kidney, possibly an exophytic  renal cyst. There is no hydronephrosis. There are no renal or ureteral stones. Urinary bladder is unremarkable. Stomach/Bowel: Stomach is unremarkable. There is mild dilation of distal jejunum measuring up to 3.6 cm in diameter. Proximal jejunum and distal ileum are not distended. There is interval decrease in degree of small bowel dilation in comparison with the study 05/01/2021. Appendix is not dilated. There is no significant wall thickening in colon. There is no pericolic stranding. Vascular/Lymphatic: Scattered arterial calcifications are seen. No new significant lymphadenopathy seen. Reproductive: Unremarkable. Other: There is no ascites or pneumoperitoneum. Left inguinal hernia containing fat is seen. There is subcutaneous edema in the upper abdominal wall without loculated fluid collections. Musculoskeletal: Unremarkable. IMPRESSION: There is mild dilation of few small bowel loops in the mid abdomen, possibly suggesting ileus or partial small bowel obstruction. There is interval decrease in degree of small bowel dilation since the study done on 05/01/2021. There is no pneumoperitoneum. There is no hydronephrosis. Small pericardial effusion. Bilateral renal cysts. Other findings as described in the body of the report. Electronically Signed   By: Elmer Picker M.D.   On: 08/09/2021 09:23   DG Chest Portable 1 View  Result Date: 08/09/2021 CLINICAL DATA:  Chest X-Ray was ordered for NG tube placement EXAM: PORTABLE CHEST - 1 VIEW COMPARISON:  Earlier film of the same day FINDINGS: Gastric tube is been advanced into the decompressed stomach. Lungs are clear. Leftward tracheal deviation at the thoracic inlet consistent with goiter described on previous studies from 02/29/2020 and previous. Heart size upper limits normal. Aortic Atherosclerosis (ICD10-170.0). Blunting of the left lateral costophrenic angle.  No pneumothorax. Visualized bones unremarkable. IMPRESSION: Gastric tube to the decompressed  stomach. Electronically Signed   By: Lucrezia Europe M.D.   On: 08/09/2021 11:52   DG Chest Portable 1 View  Result Date: 08/09/2021 CLINICAL DATA:  Chest pain. EXAM: PORTABLE CHEST 1 VIEW COMPARISON:  05/04/2021 FINDINGS: Heart size is normal. Left superior mediastinal mass is unchanged and consistent with a substernal goiter. Aortic atherosclerotic calcification noted. Low lung volumes are  seen, however both lungs are clear. IMPRESSION: Low lung volumes. No active cardiopulmonary disease. Stable substernal goiter. Electronically Signed   By: Danae Orleans M.D.   On: 08/09/2021 08:24    Procedures Procedures   Medications Ordered in ED Medications  aspirin tablet 325 mg (has no administration in time range)  phenol (CHLORASEPTIC) mouth spray 1 spray (has no administration in time range)  ondansetron (ZOFRAN) injection 4 mg (4 mg Intravenous Given 08/09/21 0813)  lactated ringers bolus 1,000 mL (0 mLs Intravenous Stopped 08/09/21 0935)  iohexol (OMNIPAQUE) 300 MG/ML solution 100 mL (100 mLs Intravenous Contrast Given 08/09/21 0848)  LORazepam (ATIVAN) tablet 0.5 mg (0.5 mg Sublingual Given 08/09/21 0911)    ED Course  I have reviewed the triage vital signs and the nursing notes.  Pertinent labs & imaging results that were available during my care of the patient were reviewed by me and considered in my medical decision making (see chart for details).    MDM Rules/Calculators/A&P                           70 y.o. male with a PMHx of  HTN, CKDIII, Hx of pulmonary embolus on anticoag, OSA, Diabetic gastroparesis, DMII with neuropathy and LT insulin, Restrictive Lung disease, IBS, hepatic stenosis, cholangitis, GERD, pancreatic lesion, thyroid nodules, hepatic stenosis, gout, B12 deficiency, POAG,  Vitamin D deficiency, morbid obesity presenting to the ED with nausea and vomiting that began at 2300 last night.  The patient last had a bowel movement on Wednesday.  He states he cannot member when he  last passed gas.  He developed nausea and vomiting last night with 2 episodes of NBNB emesis that was yellow in color.  This morning, the color of his emesis x1 turned green.  He does have a history of bowel obstruction and this is happened before when he had a bowel obstruction.  He called his physician and was recommended to go to the emergency department for further evaluation to rule out bowel obstruction.  He endorses some nausea.  He took some Zofran orally this morning but subsequently threw it up.  Additionally, he developed some substernal chest discomfort, described as a pressure sensation that radiated down his left arm earlier today which is since resolved.  On arrival, the patient was afebrile, mildly hypertensive BP 150/67, not tachycardic or tachypneic, saturating well on room air.  Differential diagnosis includes small bowel obstruction, ACS, pancreatitis, DKA, gastritis, peptic ulcer disease, cholecystitis, gastroenteritis.  Initial EKG revealed sinus rhythm, ventricular rate 66, no evidence of ST segment changes, QTC 458.  IV access was obtained and the patient was administered IV Zofran and an IV fluid bolus.  Screening labs to include delta troponins and a CT abdomen pelvis was ordered.  Chest x-ray was ordered.  The patient's EKG revealed sinus rhythm, ventricular rate 66, left anterior fascicular block, abnormal R wave progression and nonspecific T wave abnormalities but no significant changes from prior EKGs and no ST segment changes to indicate STEMI.  Chest x-ray was without acute cardiac or pulmonary abnormality.  A CT abdomen pelvis was performed which revealed a likely partial small bowel obstruction versus ileus.  Surgery was consulted and will evaluate the patient when admitted.  An NG tube was placed and a repeat x-ray was performed to confirm accurate placement.  The patient did have a climbing troponin mildly in the emergency department concerning for possible NSTEMI  versus demand ischemia. Aspirin 325  mg ordered.  The patient was chest pain-free on my assessment. Hospitalist medicine consulted for admission for further evaluation and treatment. Dr. Marylyn Ishihara accepted the patient in admission.    Final Clinical Impression(s) / ED Diagnoses Final diagnoses:  Partial intestinal obstruction, unspecified cause [facility])    Rx / DC Orders ED Discharge Orders     None        Regan Lemming, MD 08/09/21 1525

## 2021-08-09 NOTE — Progress Notes (Signed)
Notified by EDP of need for admission d/t ileus. [facility] accepts patient to tele bed at [facility]. EDP is to remain responsible for orders/medical decisions while patient is holding at [facility]. Upon arrival to [facility], [facility] will assume care. Nursing staff will call patient placement to notify them of patient's arrival so that the proper [facility] member may receive the patient.Thank you.

## 2021-08-09 NOTE — Consult Note (Signed)
Reason for Consult:  Small bowel obstruction vs. ileus Referring Physician: Dr. Jerrilyn Cairo is an 70 y.o. male.  HPI: This is a 70 year old male with a complicated past medical and surgical history who presented with two days of nausea and vomiting.  His last BM was two days ago.  He has been passing flatus.  He had a similar episode of SBO in August 2022 that resolved with NG decompression and bowel rest.  He was hospitalized in April 2022 with cholangitis and underwent ERCP with clearance of his CBD.  He underwent laparoscopic cholecystectomy with primary repair of an umbilical hernia 0000000 by Dr. Harlow Asa.    The patient has significant medical issues.  He is wheelchair-bound due to peripheral neuropathy.  Morbidly obese, OSA, HTN, kidney disease.    He presented to the ED today for evaluation due to several episodes of vomiting over the last couple of days.  NG tube was placed at [facility].  CT scan showed some mildly dilated loops of small bowel in the mid-abdomen consistent with PSBO or ileus.    Past Medical History:  Diagnosis Date   Asthma    CHF (congestive heart failure) ([place])    Diabetes mellitus without complication ([place])    Hypertension     Past Surgical History:  Procedure Laterality Date   BIOPSY  12/14/2020   Procedure: BIOPSY;  Surgeon: Jackquline Denmark, MD;  Location: [facility];  Service: Endoscopy;;   CHOLECYSTECTOMY N/A 12/17/2020   Procedure: LAPAROSCOPIC CHOLECYSTECTOMY, LIVER BIOPSY, PRIMARY UMBILICAL HERNIA REPAIR;  Surgeon: Armandina Gemma, MD;  Location: [facility];  Service: General;  Laterality: N/A;   COLONOSCOPY WITH PROPOFOL N/A 05/08/2021   Procedure: COLONOSCOPY WITH PROPOFOL;  Surgeon: Carol Ada, MD;  Location: [facility];  Service: Endoscopy;  Laterality: N/A;   ENDOSCOPIC RETROGRADE CHOLANGIOPANCREATOGRAPHY (ERCP) WITH PROPOFOL N/A 12/14/2020   Procedure: ENDOSCOPIC RETROGRADE CHOLANGIOPANCREATOGRAPHY (ERCP) WITH PROPOFOL;  Surgeon: Jackquline Denmark, MD;   Location: [facility];  Service: Endoscopy;  Laterality: N/A;   POLYPECTOMY  05/08/2021   Procedure: POLYPECTOMY;  Surgeon: Carol Ada, MD;  Location: [facility];  Service: Endoscopy;;   REMOVAL OF STONES  12/14/2020   Procedure: REMOVAL OF STONES;  Surgeon: Jackquline Denmark, MD;  Location: [facility];  Service: Endoscopy;;   SPHINCTEROTOMY  12/14/2020   Procedure: Joan Mayans;  Surgeon: Jackquline Denmark, MD;  Location: [facility];  Service: Endoscopy;;    History reviewed. No pertinent family history.  Social History:  reports that he has quit smoking. He has never used smokeless tobacco. He reports that he does not currently use alcohol. He reports that he does not use drugs.  Allergies:  Allergies  Allergen Reactions   Metoclopramide Itching    Loss of Balance; Urinary Incontinence   Nsaids     Other reaction(s): Other (See Comments) Renal Insufficiency Kidney issues    Amoxicillin-Pot Clavulanate Diarrhea and Nausea And Vomiting    Other reaction(s): Other (See Comments) Abdomen Pain Abdomen pain    Statins     Other reaction(s): Other (See Comments) Myalgias and Myositis myalgias    Allopurinol Rash   Empagliflozin     Other reaction(s): Other (See Comments) Fainting, weakness, lightheaded, falling   Tiotropium     Other reaction(s): Other (See Comments) Urinary retention    Medications:  Prior to Admission medications   Medication Sig Start Date End Date Taking? Authorizing Provider  Alpha-Lipoic Acid 300 MG TABS Take 600 mg by mouth daily.   Yes [provider]  carvedilol (COREG) 12.5 MG tablet Take 12.5 mg by mouth 2 (two) times daily with a meal. 06/12/21  Yes [provider]  Cholecalciferol 25 MCG (1000 UT) capsule Take 4,000 Units by mouth in the morning and at bedtime.   Yes [provider]  colchicine-probenecid 0.5-500 MG tablet Take 1 tablet by mouth daily. 11/21/20  Yes [provider]  cyanocobalamin (,VITAMIN  B-12,) 1000 MCG/ML injection Inject 1,000 mcg into the skin every 30 (thirty) days. 27th of month 09/23/20  Yes [provider]  doxycycline (VIBRA-TABS) 100 MG tablet Take 100 mg by mouth 2 (two) times daily. 08/04/21  Yes [provider]  ELIQUIS 5 MG TABS tablet Take 1 tablet (5 mg total) by mouth 2 (two) times daily. Resume on 05/11/21 05/11/21  Yes Lanae Boast, MD  fenofibrate 160 MG tablet Take 160 mg by mouth daily. 12/04/20  Yes [provider]  guaiFENesin (MUCINEX) 600 MG 12 hr tablet Take 600 mg by mouth 2 (two) times daily as needed for cough.   Yes [provider]  latanoprost (XALATAN) 0.005 % ophthalmic solution Place 1 drop into the left eye at bedtime. 10/29/20  Yes [provider]  magnesium oxide (MAG-OX) 400 MG tablet Take 400 mg by mouth 2 (two) times daily. 10/09/20  Yes [provider]  metoCLOPramide (REGLAN) 5 MG tablet Take 2.5 mg by mouth at bedtime. Take 1/2 tablet (2.5 mg) at bedtime 10/09/20  Yes [provider]  montelukast (SINGULAIR) 10 MG tablet Take 10 mg by mouth at bedtime. 09/28/20  Yes [provider]  NOVOLOG MIX 70/30 FLEXPEN (70-30) 100 UNIT/ML FlexPen Inject 30 Units into the skin 2 (two) times daily with a meal. 05/09/21  Yes Kc, Ramesh, MD  omeprazole (PRILOSEC) 20 MG capsule Take 20 mg by mouth daily.   Yes [provider]  ondansetron (ZOFRAN) 8 MG tablet Take 8 mg by mouth every 8 (eight) hours as needed for nausea or vomiting.   Yes [provider]  Probiotic Product (ALIGN) 4 MG CAPS Take 4 mg by mouth daily.   Yes [provider]  sodium chloride (MURO 128) 5 % ophthalmic solution 1 drop as needed for eye irritation.   Yes [provider]  traMADol (ULTRAM) 50 MG tablet Take by mouth every 6 (six) hours as needed.   Yes [provider]  acetaminophen (TYLENOL) 500 MG tablet Take 500 mg by mouth every 6 (six) hours as needed.    [provider]  benzonatate (TESSALON) 200 MG capsule Take 200 mg by mouth every 8 (eight) hours as needed for cough. 11/18/20   [provider]  bisacodyl (DULCOLAX) 10 MG suppository Place 1 suppository (10 mg total) rectally daily as needed for up to 10 doses for moderate constipation. 05/06/21   Lanae Boast, MD  carvedilol (COREG) 6.25 MG tablet Take 1 tablet (6.25 mg total) by mouth 2 (two) times daily with a meal. Patient not taking: Reported on 08/09/2021 05/09/21   Lanae Boast, MD  lansoprazole (PREVACID) 30 MG capsule Take 30 mg by mouth in the morning and at bedtime. 12/02/20   [provider]  meclizine (ANTIVERT) 25 MG tablet Take 25 mg by mouth 3 (three) times daily as needed for nausea.    [provider]  PROAIR RESPICLICK 108 (90 Base) MCG/ACT AEPB Inhale 2 puffs into the lungs every 6 (six) hours as needed (sob/wheezing). 04/03/21   [provider]     Results for  orders placed or performed during the hospital encounter of 08/09/21 (from the past 48 hour(s))  POC CBG, ED     Status: Abnormal   Collection Time: 08/09/21  7:59 AM  Result Value Ref Range   Glucose-Capillary 202 (H) 70 - 99 mg/dL    Comment: Glucose reference range applies only to samples taken after fasting for at least 8 hours.  CBC with Differential     Status: None   Collection Time: 08/09/21  8:01 AM  Result Value Ref Range   WBC 7.1 4.0 - 10.5 K/uL   RBC 4.34 4.22 - 5.81 MIL/uL   Hemoglobin 13.4 13.0 - 17.0 g/dL   HCT 41.0 39.0 - 52.0 %   MCV 94.5 80.0 - 100.0 fL   MCH 30.9 26.0 - 34.0 pg   MCHC 32.7 30.0 - 36.0 g/dL   RDW 15.4 11.5 - 15.5 %   Platelets 271 150 - 400 K/uL   nRBC 0.0 0.0 - 0.2 %   Neutrophils Relative % 60 %   Neutro Abs 4.3 1.7 - 7.7 K/uL   Lymphocytes Relative 27 %   Lymphs Abs 1.9 0.7 - 4.0 K/uL   Monocytes Relative 8 %   Monocytes Absolute 0.6 0.1 - 1.0 K/uL   Eosinophils Relative 3 %   Eosinophils Absolute 0.2 0.0 - 0.5 K/uL   Basophils Relative  1 %   Basophils Absolute 0.0 0.0 - 0.1 K/uL   Immature Granulocytes 1 %   Abs Immature Granulocytes 0.05 0.00 - 0.07 K/uL    Comment: Performed at [facility], [address] [place], [place] [address]  Comprehensive metabolic panel     Status: Abnormal   Collection Time: 08/09/21  8:01 AM  Result Value Ref Range   Sodium 136 135 - 145 mmol/L   Potassium 4.3 3.5 - 5.1 mmol/L   Chloride 100 98 - 111 mmol/L   CO2 27 22 - 32 mmol/L   Glucose, Bld 213 (H) 70 - 99 mg/dL    Comment: Glucose reference range applies only to samples taken after fasting for at least 8 hours.   BUN 33 (H) 8 - 23 mg/dL   Creatinine, Ser 1.28 (H) 0.61 - 1.24 mg/dL   Calcium 9.3 8.9 - 10.3 mg/dL   Total Protein 7.1 6.5 - 8.1 g/dL   Albumin 3.3 (L) 3.5 - 5.0 g/dL   AST 23 15 - 41 U/L   ALT 21 0 - 44 U/L   Alkaline Phosphatase 42 38 - 126 U/L   Total Bilirubin 0.4 0.3 - 1.2 mg/dL   GFR, Estimated 59 (L) >60 mL/min    Comment: (NOTE) Calculated using the CKD-EPI Creatinine Equation (2021)    Anion gap 9 5 - 15    Comment: Performed at [facility], [address] [place], [place] [address]  Lipase, blood     Status: None   Collection Time: 08/09/21  8:01 AM  Result Value Ref Range   Lipase 29 11 - 51 U/L    Comment: Performed at [facility], [address] [place], [place] [address]  Troponin I (High Sensitivity)     Status: Abnormal   Collection Time: 08/09/21  8:01 AM  Result Value Ref Range   Troponin I (High Sensitivity) 46 (H) <18 ng/L    Comment: (NOTE) Elevated high sensitivity troponin I (hsTnI) values and significant  changes across serial measurements may suggest ACS but many other  chronic and  acute conditions are known to elevate hsTnI results.  Refer to the "Links" section for chest pain algorithms and additional  guidance. Performed at [facility], [address] [place], [place] [address]   Resp Panel by RT-PCR (Flu A&B, Covid)  Nasopharyngeal Swab     Status: None   Collection Time: 08/09/21  8:14 AM   Specimen: Nasopharyngeal Swab; Nasopharyngeal(NP) swabs in vial transport medium  Result Value Ref Range   SARS Coronavirus 2 by RT PCR NEGATIVE NEGATIVE    Comment: (NOTE) SARS-CoV-2 target nucleic acids are NOT DETECTED.  The SARS-CoV-2 RNA is generally detectable in upper respiratory specimens during the acute phase of infection. The lowest concentration of SARS-CoV-2 viral copies this assay can detect is 138 copies/mL. A negative result does not preclude SARS-Cov-2 infection and should not be used as the sole basis for treatment or other patient management decisions. A negative result may occur with  improper specimen collection/handling, submission of specimen other than nasopharyngeal swab, presence of viral mutation(s) within the areas targeted by this assay, and inadequate number of viral copies(<138 copies/mL). A negative result must be combined with clinical observations, patient history, and epidemiological information. The expected result is Negative.  Fact Sheet for Patients:  EntrepreneurPulse.com.au  Fact Sheet for Healthcare Providers:  IncredibleEmployment.be  This test is no t yet approved or cleared by the [place] FDA and  has been authorized for detection and/or diagnosis of SARS-CoV-2 by FDA under an Emergency Use Authorization (EUA). This EUA will remain  in effect (meaning this test can be used) for the duration of the COVID-19 declaration under Section 564(b)(1) of the Act, 21 U.S.C.section 360bbb-3(b)(1), unless the authorization is terminated  or revoked sooner.       Influenza A by PCR NEGATIVE NEGATIVE   Influenza B by PCR NEGATIVE NEGATIVE    Comment: (NOTE) The Xpert Xpress SARS-CoV-2/FLU/RSV plus assay is intended as an aid in the diagnosis of influenza from Nasopharyngeal swab specimens and should not be used as a sole basis for  treatment. Nasal washings and aspirates are unacceptable for Xpert Xpress SARS-CoV-2/FLU/RSV testing.  Fact Sheet for Patients: EntrepreneurPulse.com.au  Fact Sheet for Healthcare Providers: IncredibleEmployment.be  This test is not yet approved or cleared by the [place] FDA and has been authorized for detection and/or diagnosis of SARS-CoV-2 by FDA under an Emergency Use Authorization (EUA). This EUA will remain in effect (meaning this test can be used) for the duration of the COVID-19 declaration under Section 564(b)(1) of the Act, 21 U.S.C. section 360bbb-3(b)(1), unless the authorization is terminated or revoked.  Performed at [facility], [address] [place], [place] [address]   Urinalysis, Routine w reflex microscopic Urine, Clean Catch     Status: None   Collection Time: 08/09/21  9:12 AM  Result Value Ref Range   Color, Urine YELLOW YELLOW   APPearance CLEAR CLEAR   Specific Gravity, Urine 1.020 1.005 - 1.030   pH 5.0 5.0 - 8.0   Glucose, UA NEGATIVE NEGATIVE mg/dL   Hgb urine dipstick NEGATIVE NEGATIVE   Bilirubin Urine NEGATIVE NEGATIVE   Ketones, ur NEGATIVE NEGATIVE mg/dL   Protein, ur NEGATIVE NEGATIVE mg/dL   Nitrite NEGATIVE NEGATIVE   Leukocytes,Ua NEGATIVE NEGATIVE    Comment: Microscopic not done on urines with negative protein, blood, leukocytes, nitrite, or glucose < 500 mg/dL. Performed at [facility], [address] [place], [place] [address]   Troponin I (High Sensitivity)  Status: Abnormal   Collection Time: 08/09/21  9:46 AM  Result Value Ref Range   Troponin I (High Sensitivity) 86 (H) <18 ng/L    Comment: DELTA CHECK NOTED (NOTE) Elevated high sensitivity troponin I (hsTnI) values and significant  changes across serial measurements may suggest ACS but many other  chronic and acute conditions are known to elevate hsTnI results.  Refer to the Links section for chest pain  algorithms and additional  guidance. Performed at [facility], [address] [place], [place] [address]   Troponin I (High Sensitivity)     Status: Abnormal   Collection Time: 08/09/21 11:57 AM  Result Value Ref Range   Troponin I (High Sensitivity) 190 (HH) <18 ng/L    Comment: DELTA CHECK NOTED CRITICAL RESULT CALLED TO, READ BACK BY AND VERIFIED WITH:  Lamar Laundry RN @1228  08/09/21 EDENSCA (NOTE) Elevated high sensitivity troponin I (hsTnI) values and significant  changes across serial measurements may suggest ACS but many other  chronic and acute conditions are known to elevate hsTnI results.  Refer to the Links section for chest pain algorithms and additional  guidance. Performed at [facility], [address] [place], [place] [address]     CT Abdomen Pelvis W Contrast  Result Date: 08/09/2021 CLINICAL DATA:  Abdominal pain EXAM: CT ABDOMEN AND PELVIS WITH CONTRAST TECHNIQUE: Multidetector CT imaging of the abdomen and pelvis was performed using the standard protocol following bolus administration of intravenous contrast. CONTRAST:  18mL OMNIPAQUE IOHEXOL 300 MG/ML  SOLN COMPARISON:  05/01/2021 FINDINGS: Lower chest: Small linear densities in the lower lung fields may suggest scarring or subsegmental atelectasis. Small pericardial effusion is present. Hepatobiliary: No focal abnormality is seen in the liver. Surgical clips are seen in gallbladder fossa. There is no dilation of bile ducts. Pancreas: No focal abnormality is seen. Spleen: Unremarkable. Adrenals/Urinary Tract: Adrenals are unremarkable. There is no hydronephrosis. There is 7.9 cm smooth marginated low-density lesion in the lower pole of left kidney suggesting renal cysts. There is small 2.1 cm fluid density structure in the margin of lower pole of right kidney, possibly an exophytic renal cyst. There is no hydronephrosis. There are no renal or ureteral stones. Urinary bladder is unremarkable.  Stomach/Bowel: Stomach is unremarkable. There is mild dilation of distal jejunum measuring up to 3.6 cm in diameter. Proximal jejunum and distal ileum are not distended. There is interval decrease in degree of small bowel dilation in comparison with the study 05/01/2021. Appendix is not dilated. There is no significant wall thickening in colon. There is no pericolic stranding. Vascular/Lymphatic: Scattered arterial calcifications are seen. No new significant lymphadenopathy seen. Reproductive: Unremarkable. Other: There is no ascites or pneumoperitoneum. Left inguinal hernia containing fat is seen. There is subcutaneous edema in the upper abdominal wall without loculated fluid collections. Musculoskeletal: Unremarkable. IMPRESSION: There is mild dilation of few small bowel loops in the mid abdomen, possibly suggesting ileus or partial small bowel obstruction. There is interval decrease in degree of small bowel dilation since the study done on 05/01/2021. There is no pneumoperitoneum. There is no hydronephrosis. Small pericardial effusion. Bilateral renal cysts. Other findings as described in the body of the report. Electronically Signed   By: Elmer Picker M.D.   On: 08/09/2021 09:23   DG Chest Portable 1 View  Result Date: 08/09/2021 CLINICAL DATA:  Chest X-Ray was ordered for NG tube placement EXAM: PORTABLE CHEST - 1 VIEW COMPARISON:  Earlier film of the same day  FINDINGS: Gastric tube is been advanced into the decompressed stomach. Lungs are clear. Leftward tracheal deviation at the thoracic inlet consistent with goiter described on previous studies from 02/29/2020 and previous. Heart size upper limits normal. Aortic Atherosclerosis (ICD10-170.0). Blunting of the left lateral costophrenic angle.  No pneumothorax. Visualized bones unremarkable. IMPRESSION: Gastric tube to the decompressed stomach. Electronically Signed   By: Lucrezia Europe M.D.   On: 08/09/2021 11:52   DG Chest Portable 1 View  Result  Date: 08/09/2021 CLINICAL DATA:  Chest pain. EXAM: PORTABLE CHEST 1 VIEW COMPARISON:  05/04/2021 FINDINGS: Heart size is normal. Left superior mediastinal mass is unchanged and consistent with a substernal goiter. Aortic atherosclerotic calcification noted. Low lung volumes are seen, however both lungs are clear. IMPRESSION: Low lung volumes. No active cardiopulmonary disease. Stable substernal goiter. Electronically Signed   By: Marlaine Hind M.D.   On: 08/09/2021 08:24    Review of Systems  Constitutional:  Positive for chills.  HENT:  Negative for ear discharge, ear pain, hearing loss and tinnitus.   Eyes:  Negative for photophobia and pain.  Respiratory:  Negative for cough and shortness of breath.   Cardiovascular:  Positive for chest pain.  Gastrointestinal:  Positive for abdominal pain, nausea and vomiting.  Genitourinary:  Negative for dysuria, flank pain, frequency and urgency.  Musculoskeletal:  Negative for back pain, myalgias and neck pain.  Neurological:  Negative for dizziness and headaches.  Hematological:  Does not bruise/bleed easily.  Psychiatric/Behavioral:  The patient is not nervous/anxious.    Blood pressure (!) 142/71, pulse 71, temperature 97.9 F (36.6 C), temperature source Oral, resp. rate 20, SpO2 96 %. Physical Exam Constitutional:  WDWN in NAD, conversant, no obvious deformities; lying in bed comfortably Eyes:  Pupils equal, round; sclera anicteric; moist conjunctiva; no lid lag HENT:  Oral mucosa moist; good dentition  Neck:  No masses palpated, trachea midline; no thyromegaly Lungs:  CTA bilaterally; normal respiratory effort CV:  Regular rate and rhythm; no murmurs; extremities well-perfused with no edema Abd:  +bowel sounds, obese, soft, non-distended, non-tender; healed umbilical incision with no sign of recurrent hernia Musc:  Unable to assess gait; no apparent clubbing or cyanosis in extremities Lymphatic:  No palpable cervical or axillary  lymphadenopathy Skin:  Warm, dry; no sign of jaundice Psychiatric - alert and oriented x 4; calm mood and affect  Assessment/Plan: Partial SBO vs. Ileus   Abdomen seems fairly benign.  Clamp NG tube - ice chips and sips of clears.  Replace to ILWS if he becomes nauseated Follow-up films in AM No indications for surgery at this time.  Imogene Burn Lillian Ballester 08/09/2021, 2:18 PM

## 2021-08-09 NOTE — ED Triage Notes (Signed)
Per EMS. Pt from home. Reports n/v that began yesterday at 2300. Hx of small bowel obstruction. Pt bed bound at baseline.

## 2021-08-09 NOTE — Progress Notes (Addendum)
ANTICOAGULATION CONSULT NOTE - Initial Consult  Pharmacy Consult for heparin Indication: chest pain/ACS, history of DVT/PE  Allergies  Allergen Reactions   Metoclopramide Itching    Loss of Balance; Urinary Incontinence   Nsaids     Other reaction(s): Other (See Comments) Renal Insufficiency Kidney issues    Amoxicillin-Pot Clavulanate Diarrhea and Nausea And Vomiting    Other reaction(s): Other (See Comments) Abdomen Pain Abdomen pain    Statins     Other reaction(s): Other (See Comments) Myalgias and Myositis myalgias    Allopurinol Rash   Empagliflozin     Other reaction(s): Other (See Comments) Fainting, weakness, lightheaded, falling   Tiotropium     Other reaction(s): Other (See Comments) Urinary retention    Patient Measurements: Height: 6\' 1"  Weight: 125.1 kg (275 lb 12.7 oz) Heparin Dosing Weight: 107.5 kg  Vital Signs: Temp: 97.9 F (36.6 C) (11/26 1323) Temp Source: Oral (11/26 1323) BP: 142/71 (11/26 1323) Pulse Rate: 71 (11/26 1323)  Labs: Recent Labs    08/09/21 0801 08/09/21 0946 08/09/21 1157 08/09/21 1435  HGB 13.4  --   --   --   HCT 41.0  --   --   --   PLT 271  --   --   --   CREATININE 1.28*  --   --   --   TROPONINIHS 46* 86* 190* 234*    Estimated Creatinine Clearance: 72.3 mL/min (A) (by C-G formula based on SCr of 1.28 mg/dL (H)).   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 High sensitivity troponin 46 > 86 > 190 > 234 > 280. Pharmacy consulted for IV heparin dosing. Last dose of Apixaban 5mg  PO BID reported as 08/08/21 at 2230. CBC WNL. Note that recent Apixaban use can falsely elevate heparin level.   Goal of Therapy:  Heparin level 0.3-0.7 units/ml aPTT 66-102 seconds Monitor platelets by  anticoagulation protocol: Yes   Plan:  Baseline PT/INR, aPTT, heparin level After baseline labs drawn, give heparin 3250 units IV bolus x 1, then start heparin infusion at 1500 units/hr aPTT 8 hours after initiation Will adjust/titrate heparin using aPTT until Apixaban has cleared from system Daily CBC, heparin level, aPTT Monitor closely for s/sx of bleeding   , PharmD, BCPS Clinical Pharmacist  08/09/2021,5:11 PM

## 2021-08-09 NOTE — H&P (Signed)
History and Physical    Randy Liu [id number] DOB: 1951/02/28 DOA: 08/09/2021  PCP: [name], MD  Patient coming from: Home  Chief Complaint: ab pain, N/V  HPI: Randy Liu is a 70 y.o. male with medical history significant of HFpEF, DM2, HLD, GERD, DVT/PE on anticoagulation. Presenting with ab pain, N/V. He was in his normal state of health until yesterday. Yesterday evening he started having multiple episodes of vomiting and some crampy abdominal pain. He also reports substernal chest tightness/pain that was accompanied by left arm numbness. He chest pain lasted about 2 - 3 hours. The vomiting continued through this morning. He became concerned that it was similar to when he had a previous SBO. So he called his PCP. They recommended that he come to the ED for evaluation. Of note, he reports he hasn't had a BM in 3 days.   ED Course: CT was concerning for ileus vs pSBO. An NGT was placed. General surgery was consulted. His troponins were elevated but EKG was ok. TRH was called for admission.   Review of Systems:  Review of systems is otherwise negative for all not mentioned in HPI.   PMHx Past Medical History:  Diagnosis Date   Asthma    CHF (congestive heart failure) ([place])    Diabetes mellitus without complication ([place])    Hypertension     PSHx Past Surgical History:  Procedure Laterality Date   BIOPSY  12/14/2020   Procedure: BIOPSY;  Surgeon: Jackquline Denmark, MD;  Location: [facility];  Service: Endoscopy;;   CHOLECYSTECTOMY N/A 12/17/2020   Procedure: LAPAROSCOPIC CHOLECYSTECTOMY, LIVER BIOPSY, PRIMARY UMBILICAL HERNIA REPAIR;  Surgeon: Armandina Gemma, MD;  Location: [facility];  Service: General;  Laterality: N/A;   COLONOSCOPY WITH PROPOFOL N/A 05/08/2021   Procedure: COLONOSCOPY WITH PROPOFOL;  Surgeon: Carol Ada, MD;  Location: [facility];  Service: Endoscopy;  Laterality: N/A;   ENDOSCOPIC RETROGRADE CHOLANGIOPANCREATOGRAPHY (ERCP) WITH PROPOFOL N/A  12/14/2020   Procedure: ENDOSCOPIC RETROGRADE CHOLANGIOPANCREATOGRAPHY (ERCP) WITH PROPOFOL;  Surgeon: Jackquline Denmark, MD;  Location: [facility];  Service: Endoscopy;  Laterality: N/A;   POLYPECTOMY  05/08/2021   Procedure: POLYPECTOMY;  Surgeon: Carol Ada, MD;  Location: [facility];  Service: Endoscopy;;   REMOVAL OF STONES  12/14/2020   Procedure: REMOVAL OF STONES;  Surgeon: Jackquline Denmark, MD;  Location: [facility];  Service: Endoscopy;;   SPHINCTEROTOMY  12/14/2020   Procedure: Joan Mayans;  Surgeon: Jackquline Denmark, MD;  Location: [facility];  Service: Endoscopy;;    SocHx  reports that he has quit smoking. He has never used smokeless tobacco. He reports that he does not currently use alcohol. He reports that he does not use drugs.  Allergies  Allergen Reactions   Metoclopramide Itching    Loss of Balance; Urinary Incontinence   Nsaids     Other reaction(s): Other (See Comments) Renal Insufficiency Kidney issues    Amoxicillin-Pot Clavulanate Diarrhea and Nausea And Vomiting    Other reaction(s): Other (See Comments) Abdomen Pain Abdomen pain    Statins     Other reaction(s): Other (See Comments) Myalgias and Myositis myalgias    Allopurinol Rash   Empagliflozin     Other reaction(s): Other (See Comments) Fainting, weakness, lightheaded, falling   Tiotropium     Other reaction(s): Other (See Comments) Urinary retention    FamHx History reviewed. No pertinent family history.  Prior to Admission medications   Medication Sig Start Date End Date Taking? Authorizing Provider  Alpha-Lipoic Acid 300 MG TABS Take 600 mg  by mouth daily.   Yes [provider]  carvedilol (COREG) 6.25 MG tablet Take 1 tablet (6.25 mg total) by mouth 2 (two) times daily with a meal. 05/09/21  Yes Kc, Maren Beach, MD  Cholecalciferol 25 MCG (1000 UT) capsule Take 4,000 Units by mouth in the morning and at bedtime.   Yes [provider]  colchicine-probenecid 0.5-500 MG tablet  Take 1 tablet by mouth daily. 11/21/20  Yes [provider]  cyanocobalamin (,VITAMIN B-12,) 1000 MCG/ML injection Inject 1,000 mcg into the skin every 30 (thirty) days. 27th of month 09/23/20  Yes [provider]  doxycycline (VIBRA-TABS) 100 MG tablet Take 100 mg by mouth 2 (two) times daily.   Yes [provider]  ELIQUIS 5 MG TABS tablet Take 1 tablet (5 mg total) by mouth 2 (two) times daily. Resume on 05/11/21 05/11/21  Yes Antonieta Pert, MD  fenofibrate 160 MG tablet Take 160 mg by mouth daily. 12/04/20  Yes [provider]  guaiFENesin (MUCINEX) 600 MG 12 hr tablet Take by mouth 2 (two) times daily.   Yes [provider]  latanoprost (XALATAN) 0.005 % ophthalmic solution Place 1 drop into the left eye at bedtime. 10/29/20  Yes [provider]  magnesium oxide (MAG-OX) 400 MG tablet Take 400 mg by mouth 2 (two) times daily. 10/09/20  Yes [provider]  metoCLOPramide (REGLAN) 5 MG tablet Take 2.5 mg by mouth at bedtime. Take 1/2 tablet (2.5 mg) at bedtime 10/09/20  Yes [provider]  montelukast (SINGULAIR) 10 MG tablet Take 10 mg by mouth at bedtime. 09/28/20  Yes [provider]  NOVOLOG MIX 70/30 FLEXPEN (70-30) 100 UNIT/ML FlexPen Inject 30 Units into the skin 2 (two) times daily with a meal. 05/09/21  Yes Kc, Ramesh, MD  omeprazole (PRILOSEC) 20 MG capsule Take 20 mg by mouth daily.   Yes [provider]  ondansetron (ZOFRAN) 8 MG tablet Take 8 mg by mouth every 8 (eight) hours as needed for nausea or vomiting.   Yes [provider]  Probiotic Product (ALIGN) 4 MG CAPS Take 4 mg by mouth daily.   Yes [provider]  sodium chloride (MURO 128) 5 % ophthalmic solution 1 drop as needed for eye irritation.   Yes [provider]  traMADol (ULTRAM) 50 MG tablet Take by mouth every 6 (six) hours as needed.   Yes [provider]  acetaminophen (TYLENOL) 500 MG tablet Take 500 mg  by mouth every 6 (six) hours as needed.    [provider]  benzonatate (TESSALON) 200 MG capsule Take 200 mg by mouth every 8 (eight) hours as needed for cough. 11/18/20   [provider]  bisacodyl (DULCOLAX) 10 MG suppository Place 1 suppository (10 mg total) rectally daily as needed for up to 10 doses for moderate constipation. 05/06/21   Antonieta Pert, MD  lansoprazole (PREVACID) 30 MG capsule Take 30 mg by mouth in the morning and at bedtime. 12/02/20   [provider]  meclizine (ANTIVERT) 25 MG tablet Take 25 mg by mouth 3 (three) times daily as needed for nausea.    [provider]  PROAIR RESPICLICK 123XX123 (90 Base) MCG/ACT AEPB Inhale 2 puffs into the lungs every 6 (six) hours as needed (sob/wheezing). 04/03/21   [provider]    Physical Exam: Vitals:   08/09/21 1158 08/09/21 1200 08/09/21 1215 08/09/21 1323  BP:  129/77 111/76 (!) 142/71  Pulse: 73 72 78 71  Resp: 20  15 20 20   Temp:    97.9 F (36.6 C)  TempSrc:    Oral  SpO2: 97% 97% 98% 96%    General: 70 y.o. male resting in bed in NAD Eyes: PERRL, normal sclera ENMT: Nares patent w/o discharge, orophaynx clear, dentition normal, ears w/o discharge/lesions/ulcers Neck: Supple, trachea midline Cardiovascular: RRR, +S1, S2, no m/g/r, equal pulses throughout Respiratory: CTABL, no w/r/r, normal WOB GI: BS+, NDNT, obese, no masses noted, no organomegaly noted MSK: No e/c/c Neuro: A&O x 3, no focal deficits Psyc: Appropriate interaction and affect, calm/cooperative  Labs on Admission: I have personally reviewed following labs and imaging studies  CBC: Recent Labs  Lab 08/09/21 0801  WBC 7.1  NEUTROABS 4.3  HGB 13.4  HCT 41.0  MCV 94.5  PLT 271   Basic Metabolic Panel: Recent Labs  Lab 08/09/21 0801  NA 136  K 4.3  CL 100  CO2 27  GLUCOSE 213*  BUN 33*  CREATININE 1.28*  CALCIUM 9.3   GFR: CrCl cannot be calculated (Unknown ideal weight.). Liver Function  Tests: Recent Labs  Lab 08/09/21 0801  AST 23  ALT 21  ALKPHOS 42  BILITOT 0.4  PROT 7.1  ALBUMIN 3.3*   Recent Labs  Lab 08/09/21 0801  LIPASE 29   No results for input(s): AMMONIA in the last 168 hours. Coagulation Profile: No results for input(s): INR, PROTIME in the last 168 hours. Cardiac Enzymes: No results for input(s): CKTOTAL, CKMB, CKMBINDEX, TROPONINI in the last 168 hours. BNP (last 3 results) No results for input(s): PROBNP in the last 8760 hours. HbA1C: No results for input(s): HGBA1C in the last 72 hours. CBG: Recent Labs  Lab 08/09/21 0759  GLUCAP 202*   Lipid Profile: No results for input(s): CHOL, HDL, LDLCALC, TRIG, CHOLHDL, LDLDIRECT in the last 72 hours. Thyroid Function Tests: No results for input(s): TSH, T4TOTAL, FREET4, T3FREE, THYROIDAB in the last 72 hours. Anemia Panel: No results for input(s): VITAMINB12, FOLATE, FERRITIN, TIBC, IRON, RETICCTPCT in the last 72 hours. Urine analysis:    Component Value Date/Time   COLORURINE YELLOW 08/09/2021 0912   APPEARANCEUR CLEAR 08/09/2021 0912   LABSPEC 1.020 08/09/2021 0912   PHURINE 5.0 08/09/2021 0912   GLUCOSEU NEGATIVE 08/09/2021 0912   HGBUR NEGATIVE 08/09/2021 0912   BILIRUBINUR NEGATIVE 08/09/2021 0912   KETONESUR NEGATIVE 08/09/2021 0912   PROTEINUR NEGATIVE 08/09/2021 0912   NITRITE NEGATIVE 08/09/2021 0912   LEUKOCYTESUR NEGATIVE 08/09/2021 0912    Radiological Exams on Admission: CT Abdomen Pelvis W Contrast  Result Date: 08/09/2021 CLINICAL DATA:  Abdominal pain EXAM: CT ABDOMEN AND PELVIS WITH CONTRAST TECHNIQUE: Multidetector CT imaging of the abdomen and pelvis was performed using the standard protocol following bolus administration of intravenous contrast. CONTRAST:  08/11/2021 OMNIPAQUE IOHEXOL 300 MG/ML  SOLN COMPARISON:  05/01/2021 FINDINGS: Lower chest: Small linear densities in the lower lung fields may suggest scarring or subsegmental atelectasis. Small pericardial effusion  is present. Hepatobiliary: No focal abnormality is seen in the liver. Surgical clips are seen in gallbladder fossa. There is no dilation of bile ducts. Pancreas: No focal abnormality is seen. Spleen: Unremarkable. Adrenals/Urinary Tract: Adrenals are unremarkable. There is no hydronephrosis. There is 7.9 cm smooth marginated low-density lesion in the lower pole of left kidney suggesting renal cysts. There is small 2.1 cm fluid density structure in the margin of lower pole of right kidney, possibly an exophytic renal cyst. There is no hydronephrosis. There are no renal or ureteral stones. Urinary bladder is  unremarkable. Stomach/Bowel: Stomach is unremarkable. There is mild dilation of distal jejunum measuring up to 3.6 cm in diameter. Proximal jejunum and distal ileum are not distended. There is interval decrease in degree of small bowel dilation in comparison with the study 05/01/2021. Appendix is not dilated. There is no significant wall thickening in colon. There is no pericolic stranding. Vascular/Lymphatic: Scattered arterial calcifications are seen. No new significant lymphadenopathy seen. Reproductive: Unremarkable. Other: There is no ascites or pneumoperitoneum. Left inguinal hernia containing fat is seen. There is subcutaneous edema in the upper abdominal wall without loculated fluid collections. Musculoskeletal: Unremarkable. IMPRESSION: There is mild dilation of few small bowel loops in the mid abdomen, possibly suggesting ileus or partial small bowel obstruction. There is interval decrease in degree of small bowel dilation since the study done on 05/01/2021. There is no pneumoperitoneum. There is no hydronephrosis. Small pericardial effusion. Bilateral renal cysts. Other findings as described in the body of the report. Electronically Signed   By: Elmer Picker M.D.   On: 08/09/2021 09:23   DG Chest Portable 1 View  Result Date: 08/09/2021 CLINICAL DATA:  Chest X-Ray was ordered for NG tube  placement EXAM: PORTABLE CHEST - 1 VIEW COMPARISON:  Earlier film of the same day FINDINGS: Gastric tube is been advanced into the decompressed stomach. Lungs are clear. Leftward tracheal deviation at the thoracic inlet consistent with goiter described on previous studies from 02/29/2020 and previous. Heart size upper limits normal. Aortic Atherosclerosis (ICD10-170.0). Blunting of the left lateral costophrenic angle.  No pneumothorax. Visualized bones unremarkable. IMPRESSION: Gastric tube to the decompressed stomach. Electronically Signed   By: Lucrezia Europe M.D.   On: 08/09/2021 11:52   DG Chest Portable 1 View  Result Date: 08/09/2021 CLINICAL DATA:  Chest pain. EXAM: PORTABLE CHEST 1 VIEW COMPARISON:  05/04/2021 FINDINGS: Heart size is normal. Left superior mediastinal mass is unchanged and consistent with a substernal goiter. Aortic atherosclerotic calcification noted. Low lung volumes are seen, however both lungs are clear. IMPRESSION: Low lung volumes. No active cardiopulmonary disease. Stable substernal goiter. Electronically Signed   By: Marlaine Hind M.D.   On: 08/09/2021 08:24    EKG: Independently reviewed. Sinus, no st elevations  Assessment/Plan Intractable N/V Partial SBO     - placed in obs, tele     - NGT in place     - general surgery onboard, appreciate assistance; clamping tube, allowing ice chips and sips of clears; plan per them  Chest pain Elevated troponin     - EKG was ok; repeating     - CP is not reproducible on exam     - trp trending up; will cycle another set     - echo ordered     - spoke with cards, they will follow  HLD     - resume home regimen when he is able to take meds  GERD     - protonix  Chronic HFpEF     - resume home regimen when he is able to take meds  Hx of DVT/PE on eliquis     - resume home regimen when he is able to take meds  DM2     - allowed ice chips and sips of clears     - check A1c, glucose     - SSI  DVT prophylaxis:  heparin Code Status: FULL  Family Communication: w/ wife at bedside  Consults called: General surgery, cardiology   Status is: Observation  The patient remains  OBS appropriate and will d/c before 2 midnights.  Jonnie Finner DO [facility]  If 7PM-7AM, please contact night-coverage [email or web address]  08/09/2021, 1:52 PM

## 2021-08-09 NOTE — Consult Note (Signed)
CARDIOLOGY CONSULT NOTE  Patient ID: Randy Liu MRN: [id number] DOB/AGE: July 11, 1951 70 y.o.  Admit date: 08/09/2021 Attending physician: Teddy Spike, DO Primary Physician:  Felton Clinton, MD Outpatient Cardiologist: NA Inpatient Cardiologist: Tessa Lerner, DO, [facility]  Reason of consultation: Chest Pain and Troponin  Referring physician: Teddy Spike, DO  Chief complaint: Abdominal pain, nausea and vomiting  HPI:  Randy Liu is a 70 y.o. 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  Patient is accompanied by his wife [name] at bedside.  History is provided by both the patient and his wife during today's encounter.   Currently being admitted for abdominal pain, nausea, vomiting with a working diagnosis of small bowel obstruction versus ileus.  Cardiology is consulted for chest pain and troponin management.  Yesterday night approximately 11:30 PM patient's wife states that he started having abdominal pain, nausea, vomiting symptoms very similar to his prior SBO.  Patient went to bed and approximately at 2:30 AM started experiencing epigastric discomfort.  Both the patient and his wife attributed this to underlying nausea/retching/vomiting.  However, patient did also experience left arm numbness radiating from left shoulder to elbow level.  The epigastric discomfort was 6 out of 10, dull-like sensation, lasted for approximately 45 minutes, and patient fell asleep.   The symptoms were brought at rest in the setting of nausea and vomiting.  Patient's functional  capacity is limited due to neuropathy secondary to prolonged diabetes and COVID-19 infections.  He is predominantly bedbound.   Patient has not had any cardiac work-up in the past (i.e. stress test or left heart catheterization).  He was diagnosed with pulmonary embolism back in October 2020 for which she is currently on oral anticoagulation.  During the time of encounter he does not have any active chest pain.   Patient denies orthopnea, paroxysmal nocturnal dyspnea or lower extremity swelling. ALLERGIES: Allergies  Allergen Reactions   Metoclopramide Itching    Loss of Balance; Urinary Incontinence   Nsaids     Other reaction(s): Other (See Comments) Renal Insufficiency Kidney issues    Amoxicillin-Pot Clavulanate Diarrhea and Nausea And Vomiting    Other reaction(s): Other (See Comments) Abdomen Pain Abdomen pain    Statins     Other reaction(s): Other (See Comments) Myalgias and Myositis myalgias    Allopurinol Rash   Empagliflozin     Other reaction(s): Other (See Comments) Fainting, weakness, lightheaded, falling   Tiotropium     Other reaction(s): Other (See Comments) Urinary retention    PAST MEDICAL HISTORY: Past Medical History:  Diagnosis Date   Asthma    CHF (congestive heart failure) (HCC)    Diabetes mellitus without complication (HCC)    Hypertension   Pulmonary embolism diagnosed September/October 2020, per wife. Hyperlipidemia Hypertriglyceridemia.  PAST SURGICAL HISTORY: Past Surgical History:  Procedure Laterality Date   BIOPSY  12/14/2020   Procedure: BIOPSY;  Surgeon: Lynann Bologna, MD;  Location: [facility];  Service: Endoscopy;;   CHOLECYSTECTOMY N/A 12/17/2020   Procedure: LAPAROSCOPIC CHOLECYSTECTOMY, LIVER BIOPSY, PRIMARY UMBILICAL HERNIA REPAIR;  Surgeon: Darnell Level, MD;  Location: [facility];  Service: General;  Laterality: N/A;   COLONOSCOPY WITH PROPOFOL N/A 05/08/2021  Procedure: COLONOSCOPY WITH PROPOFOL;  Surgeon: Carol Ada, MD;   Location: [facility];  Service: Endoscopy;  Laterality: N/A;   ENDOSCOPIC RETROGRADE CHOLANGIOPANCREATOGRAPHY (ERCP) WITH PROPOFOL N/A 12/14/2020   Procedure: ENDOSCOPIC RETROGRADE CHOLANGIOPANCREATOGRAPHY (ERCP) WITH PROPOFOL;  Surgeon: Jackquline Denmark, MD;  Location: [facility];  Service: Endoscopy;  Laterality: N/A;   POLYPECTOMY  05/08/2021   Procedure: POLYPECTOMY;  Surgeon: Carol Ada, MD;  Location: [facility];  Service: Endoscopy;;   REMOVAL OF STONES  12/14/2020   Procedure: REMOVAL OF STONES;  Surgeon: Jackquline Denmark, MD;  Location: [facility];  Service: Endoscopy;;   SPHINCTEROTOMY  12/14/2020   Procedure: Joan Mayans;  Surgeon: Jackquline Denmark, MD;  Location: [facility];  Service: Endoscopy;;    FAMILY HISTORY: Family history of heart disease. No family history of premature CAD, sudden cardiac death, or cardiomyopathy.   SOCIAL HISTORY:  The patient  reports that he has quit smoking. He has never used smokeless tobacco. He reports that he does not currently use alcohol. He reports that he does not use drugs.  MEDICATIONS: Current Outpatient Medications  Medication Instructions   acetaminophen (TYLENOL) 1,000 mg, Oral, Every 8 hours PRN   Align 4 mg, Oral, Daily   Alpha-Lipoic Acid 600 mg, Oral, Daily   benzonatate (TESSALON) 200 mg, Oral, Every 8 hours PRN   bisacodyl (DULCOLAX) 10 mg, Rectal, Daily PRN   carvedilol (COREG) 6.25 mg, Oral, 2 times daily with meals   carvedilol (COREG) 12.5 mg, Oral, 2 times daily with meals   Cholecalciferol 4,000 Units, Oral, Daily   colchicine-probenecid 0.5-500 MG tablet 1 tablet, Oral, Daily   cyanocobalamin ((VITAMIN B-12)) 1,000 mcg, Subcutaneous, Every 30 days   doxycycline (VIBRA-TABS) 100 mg, Oral, 2 times daily   Eliquis 5 mg, Oral, 2 times daily, Resume on 05/11/21   fenofibrate 160 mg, Oral, Daily   fluticasone (FLONASE) 50 MCG/ACT nasal spray 1 spray, Each Nare, Daily   guaiFENesin (MUCINEX) 600 mg, Oral, 2 times daily  PRN   lansoprazole (PREVACID) 30 mg, Oral, 2 times daily   latanoprost (XALATAN) 0.005 % ophthalmic solution 1 drop, Left Eye, Daily at bedtime   magnesium oxide (MAG-OX) 400 mg, Oral, 2 times daily   meclizine (ANTIVERT) 25 mg, Oral, 3 times daily PRN   metoCLOPramide (REGLAN) 2.5 mg, Oral, Daily at bedtime   montelukast (SINGULAIR) 10 mg, Oral, Daily at bedtime   NOVOLOG MIX 70/30 FLEXPEN (70-30) 100 UNIT/ML FlexPen 30 Units, Subcutaneous, 2 times daily with meals   ondansetron (ZOFRAN) 8 mg, Oral, Every 8 hours PRN   Polyethylene Glycol 400 (BLINK TEARS) 0.25 % SOLN 1 drop, Both Eyes, Daily PRN   PROAIR RESPICLICK [id number] (90 Base) MCG/ACT AEPB 2 puffs, Inhalation, Every 6 hours PRN   sodium chloride (MURO 128) 5 % ophthalmic ointment 1 application, Both Eyes, Daily at bedtime    REVIEW OF SYSTEMS: Review of Systems  Constitutional: Negative for chills and fever.  HENT:  Negative for hoarse voice and nosebleeds.   Eyes:  Negative for discharge, double vision and pain.  Cardiovascular:  Positive for chest pain. Negative for claudication, dyspnea on exertion, leg swelling, near-syncope, orthopnea, palpitations, paroxysmal nocturnal dyspnea and syncope.  Respiratory:  Negative for hemoptysis and shortness of breath.   Musculoskeletal:  Negative for muscle cramps and myalgias.  Gastrointestinal:  Positive for abdominal pain, nausea and vomiting. Negative for constipation, diarrhea, hematemesis, hematochezia and melena.  Neurological:  Negative for dizziness and light-headedness.  All other systems reviewed and are negative.  PHYSICAL EXAM:  Vitals with BMI 08/09/2021 08/09/2021 08/09/2021  Height - - -  Weight - 275 lbs 13 oz -  BMI - - -  Systolic [id number] - A999333  Diastolic 60 - 71  Pulse 71 - 71     Intake/Output Summary (Last 24 hours) at 08/09/2021 1809 Last data filed at 08/09/2021 0935 Gross per 24 hour  Intake 1000.15 ml  Output --  Net 1000.15 ml    Net IO Since Admission:  1,000.15 mL [08/09/21 1809]  CONSTITUTIONAL: Appears older than stated age, hemodynamically stable, well-developed and well-nourished. No acute distress.  SKIN: Skin is warm and dry. No rash noted. No cyanosis. No pallor. No jaundice HEAD: Normocephalic and atraumatic.  EYES: No scleral icterus MOUTH/THROAT: Moist oral membranes.  NG tube present. NECK: No JVD present. No thyromegaly noted. No carotid bruits  LYMPHATIC: No visible cervical adenopathy.  CHEST Normal respiratory effort. No intercostal retractions  LUNGS: Clear to auscultation bilaterally.  No stridor. No wheezes. No rales.  CARDIOVASCULAR: Regular rate and rhythm, positive S1-S2, no murmurs rubs or gallops appreciated ABDOMINAL: Obese, soft, nontender, nondistended, hypoactive bowel sounds in all 4 quadrants, no ascites.   EXTREMITIES: No pitting edema, warm to touch.  HEMATOLOGIC: No significant bruising NEUROLOGIC: Oriented to person, place, and time. Nonfocal. Normal muscle tone.  PSYCHIATRIC: Normal mood and affect. Normal behavior. Cooperative  RADIOLOGY: CT Abdomen Pelvis W Contrast  Result Date: 08/09/2021 CLINICAL DATA:  Abdominal pain EXAM: CT ABDOMEN AND PELVIS WITH CONTRAST TECHNIQUE: Multidetector CT imaging of the abdomen and pelvis was performed using the standard protocol following bolus administration of intravenous contrast. CONTRAST:  162mL OMNIPAQUE IOHEXOL 300 MG/ML  SOLN COMPARISON:  05/01/2021 FINDINGS: Lower chest: Small linear densities in the lower lung fields may suggest scarring or subsegmental atelectasis. Small pericardial effusion is present. Hepatobiliary: No focal abnormality is seen in the liver. Surgical clips are seen in gallbladder fossa. There is no dilation of bile ducts. Pancreas: No focal abnormality is seen. Spleen: Unremarkable. Adrenals/Urinary Tract: Adrenals are unremarkable. There is no hydronephrosis. There is 7.9 cm smooth marginated low-density lesion in the lower pole of left  kidney suggesting renal cysts. There is small 2.1 cm fluid density structure in the margin of lower pole of right kidney, possibly an exophytic renal cyst. There is no hydronephrosis. There are no renal or ureteral stones. Urinary bladder is unremarkable. Stomach/Bowel: Stomach is unremarkable. There is mild dilation of distal jejunum measuring up to 3.6 cm in diameter. Proximal jejunum and distal ileum are not distended. There is interval decrease in degree of small bowel dilation in comparison with the study 05/01/2021. Appendix is not dilated. There is no significant wall thickening in colon. There is no pericolic stranding. Vascular/Lymphatic: Scattered arterial calcifications are seen. No new significant lymphadenopathy seen. Reproductive: Unremarkable. Other: There is no ascites or pneumoperitoneum. Left inguinal hernia containing fat is seen. There is subcutaneous edema in the upper abdominal wall without loculated fluid collections. Musculoskeletal: Unremarkable. IMPRESSION: There is mild dilation of few small bowel loops in the mid abdomen, possibly suggesting ileus or partial small bowel obstruction. There is interval decrease in degree of small bowel dilation since the study done on 05/01/2021. There is no pneumoperitoneum. There is no hydronephrosis. Small pericardial effusion. Bilateral renal cysts. Other findings as described in the body of the report. Electronically Signed   By: Elmer Picker M.D.   On: 08/09/2021 09:23   DG Chest Portable 1 View  Result Date: 08/09/2021 CLINICAL DATA:  Chest X-Ray was ordered  for NG tube placement EXAM: PORTABLE CHEST - 1 VIEW COMPARISON:  Earlier film of the same day FINDINGS: Gastric tube is been advanced into the decompressed stomach. Lungs are clear. Leftward tracheal deviation at the thoracic inlet consistent with goiter described on previous studies from 02/29/2020 and previous. Heart size upper limits normal. Aortic Atherosclerosis (ICD10-170.0).  Blunting of the left lateral costophrenic angle.  No pneumothorax. Visualized bones unremarkable. IMPRESSION: Gastric tube to the decompressed stomach. Electronically Signed   By: Lucrezia Europe M.D.   On: 08/09/2021 11:52   DG Chest Portable 1 View  Result Date: 08/09/2021 CLINICAL DATA:  Chest pain. EXAM: PORTABLE CHEST 1 VIEW COMPARISON:  05/04/2021 FINDINGS: Heart size is normal. Left superior mediastinal mass is unchanged and consistent with a substernal goiter. Aortic atherosclerotic calcification noted. Low lung volumes are seen, however both lungs are clear. IMPRESSION: Low lung volumes. No active cardiopulmonary disease. Stable substernal goiter. Electronically Signed   By: Marlaine Hind M.D.   On: 08/09/2021 08:24    LABORATORY DATA: Lab Results  Component Value Date   WBC 7.1 08/09/2021   HGB 13.4 08/09/2021   HCT 41.0 08/09/2021   MCV 94.5 08/09/2021   PLT 271 08/09/2021    Recent Labs  Lab 08/09/21 0801  NA 136  K 4.3  CL 100  CO2 27  BUN 33*  CREATININE 1.28*  CALCIUM 9.3  PROT 7.1  BILITOT 0.4  ALKPHOS 42  ALT 21  AST 23  GLUCOSE 213*    Lipid Panel  No results found for: CHOL, HDL, LDLCALC, LDLDIRECT, TRIG, CHOLHDL  BNP (last 3 results) Recent Labs    08/09/21 1435  BNP 138.4*    HEMOGLOBIN A1C Lab Results  Component Value Date   HGBA1C 7.9 (H) 05/02/2021   MPG 180.03 05/02/2021    Cardiac Panel (last 3 results) Recent Labs    08/09/21 1157 08/09/21 1435 08/09/21 1619  TROPONINIHS 190* 234* 280*     TSH No results for input(s): TSH in the last 8760 hours.   CARDIAC DATABASE: EKG: 08/09/2021: NSR, 66 bpm, left axis deviation, poor R wave progression, without underlying injury pattern.  08/09/2021: NSR, 67 bpm, ST depressions in the high lateral leads suggestive of lateral ischemia.  Echocardiogram: 08/09/2021: LVEF 45-50% (prelim)   Stress Testing:  None  Heart Catheterization: None  IMPRESSION & RECOMMENDATIONS: Kweisi Kribs is a 70 y.o. Caucasian male whose past medical history and cardiovascular risk factors include: Insulin-dependent diabetes mellitus type 2, chronic HFpEF, hyperlipidemia (not on statin therapy due to intolerance), hypertriglyceridemia, GERD, history of PE on oral anticoagulation, predominantly bedbound due to peripheral neuropathy due to diabetes and COVID-19 infection, obesity due to excess calories.  Impression:  NSTEMI: Epigastric discomfort - not entirely cardiac; but limited to due poor functional capacity at baseline.  Dynamic EKG changes, w/ uptrend of hs troponin.  Agree with IV Heparin for ACS protocol.  ASA 325 mg time once and 81 mg po qday and hold statin for now (patient claims to be intolerant).  Add Zetia 10mg  po qday.  Echo - study is not complete (contrast images are still pending). Images reviewed LVEF 45-50%.  Continue home dose of beta - blockers.  Trend troponin until they peak.  Check BNP, Fasting lipid profile, A1c for risk factor modification.  Patient is informed to let us know if he develops chest pain.  Discussed risks, benefits and alternatives of left heart catheterization with possible intervention with both the patient and his wife at  today's encounter.  They verbalized understanding and would like to proceed. We will tentatively schedule angiography upcoming week or sooner if change in clinical status.   Hyperlipidemia: Currently not on statin therapy. Claims to be intolerant to statin therapy wife claims "have tried them all." Check fasting lipid profile. For now we will add Zetia 10 mg p.o. daily. Will be discussed statin therapy tomorrow.  Hypertriglyceridemia: Continue fenofibrate.   Insulin-dependent diabetes mellitus type 2 with complications of neuropathy. Currently managed per primary team.  Intractable abdominal pain, nausea, vomiting:  Working diagnosis partial bowel obstruction versus ileus Improving  Hx of PE: on Eliquis at home;  will transition to IV heparin. Last dose night of 08/08/2021.   Will defer management of his other co-morbid conditions and active problem list to primary team for now.   Thank you for allowing [place] to participate in the care of Demitry Shelor please reach out if any questions or concerns arise.   Total encounter time 86 minutes. *Total Encounter Time as defined by the Centers for Medicare and Medicaid Services includes, in addition to the face-to-face time of a patient visit (documented in the note above) non-face-to-face time: obtaining and reviewing outside history, ordering and reviewing medications, tests or procedures, care coordination (communications with other health care professionals or caregivers) and documentation in the medical record.  Patient's questions and concerns were addressed to his satisfaction. He voices understanding of the instructions provided during this encounter.   This note was created using a voice recognition software as a result there may be grammatical errors inadvertently enclosed that do not reflect the nature of this encounter. Every attempt is made to correct such errors.  Mechele Claude [facility]  Pager: [phone] Office: [phone] 08/09/2021, 6:09 PM

## 2021-08-09 NOTE — Progress Notes (Signed)
Alerted Dr. Ronaldo Miyamoto to another critical Troponin of 280 at this time, via page.

## 2021-08-10 ENCOUNTER — Observation Stay (HOSPITAL_COMMUNITY): Payer: Medicare Other

## 2021-08-10 DIAGNOSIS — E1169 Type 2 diabetes mellitus with other specified complication: Secondary | ICD-10-CM | POA: Diagnosis present

## 2021-08-10 DIAGNOSIS — I129 Hypertensive chronic kidney disease with stage 1 through stage 4 chronic kidney disease, or unspecified chronic kidney disease: Secondary | ICD-10-CM | POA: Diagnosis present

## 2021-08-10 DIAGNOSIS — E781 Pure hyperglyceridemia: Secondary | ICD-10-CM | POA: Diagnosis present

## 2021-08-10 DIAGNOSIS — Z7401 Bed confinement status: Secondary | ICD-10-CM | POA: Diagnosis not present

## 2021-08-10 DIAGNOSIS — E1122 Type 2 diabetes mellitus with diabetic chronic kidney disease: Secondary | ICD-10-CM | POA: Diagnosis present

## 2021-08-10 DIAGNOSIS — I25110 Atherosclerotic heart disease of native coronary artery with unstable angina pectoris: Secondary | ICD-10-CM | POA: Diagnosis present

## 2021-08-10 DIAGNOSIS — J45909 Unspecified asthma, uncomplicated: Secondary | ICD-10-CM | POA: Diagnosis present

## 2021-08-10 DIAGNOSIS — K56600 Partial intestinal obstruction, unspecified as to cause: Secondary | ICD-10-CM | POA: Diagnosis present

## 2021-08-10 DIAGNOSIS — I214 Non-ST elevation (NSTEMI) myocardial infarction: Secondary | ICD-10-CM | POA: Diagnosis present

## 2021-08-10 DIAGNOSIS — E7800 Pure hypercholesterolemia, unspecified: Secondary | ICD-10-CM | POA: Diagnosis present

## 2021-08-10 DIAGNOSIS — I5032 Chronic diastolic (congestive) heart failure: Secondary | ICD-10-CM | POA: Diagnosis present

## 2021-08-10 DIAGNOSIS — G4733 Obstructive sleep apnea (adult) (pediatric): Secondary | ICD-10-CM | POA: Diagnosis present

## 2021-08-10 DIAGNOSIS — R778 Other specified abnormalities of plasma proteins: Secondary | ICD-10-CM

## 2021-08-10 DIAGNOSIS — Z7901 Long term (current) use of anticoagulants: Secondary | ICD-10-CM | POA: Diagnosis not present

## 2021-08-10 DIAGNOSIS — N179 Acute kidney failure, unspecified: Secondary | ICD-10-CM | POA: Diagnosis present

## 2021-08-10 DIAGNOSIS — Z20822 Contact with and (suspected) exposure to covid-19: Secondary | ICD-10-CM | POA: Diagnosis present

## 2021-08-10 DIAGNOSIS — N1831 Chronic kidney disease, stage 3a: Secondary | ICD-10-CM | POA: Diagnosis present

## 2021-08-10 DIAGNOSIS — E1142 Type 2 diabetes mellitus with diabetic polyneuropathy: Secondary | ICD-10-CM | POA: Diagnosis present

## 2021-08-10 DIAGNOSIS — E1165 Type 2 diabetes mellitus with hyperglycemia: Secondary | ICD-10-CM | POA: Diagnosis present

## 2021-08-10 DIAGNOSIS — I130 Hypertensive heart and chronic kidney disease with heart failure and stage 1 through stage 4 chronic kidney disease, or unspecified chronic kidney disease: Secondary | ICD-10-CM | POA: Diagnosis present

## 2021-08-10 DIAGNOSIS — I429 Cardiomyopathy, unspecified: Secondary | ICD-10-CM | POA: Diagnosis present

## 2021-08-10 DIAGNOSIS — E6609 Other obesity due to excess calories: Secondary | ICD-10-CM | POA: Diagnosis present

## 2021-08-10 DIAGNOSIS — K567 Ileus, unspecified: Secondary | ICD-10-CM | POA: Diagnosis present

## 2021-08-10 DIAGNOSIS — K219 Gastro-esophageal reflux disease without esophagitis: Secondary | ICD-10-CM | POA: Diagnosis present

## 2021-08-10 DIAGNOSIS — R7989 Other specified abnormal findings of blood chemistry: Secondary | ICD-10-CM

## 2021-08-10 LAB — CBC
HCT: 41.9 % (ref 39.0–52.0)
Hemoglobin: 13.4 g/dL (ref 13.0–17.0)
MCH: 30.2 pg (ref 26.0–34.0)
MCHC: 32 g/dL (ref 30.0–36.0)
MCV: 94.6 fL (ref 80.0–100.0)
Platelets: 292 10*3/uL (ref 150–400)
RBC: 4.43 MIL/uL (ref 4.22–5.81)
RDW: 15.8 % — ABNORMAL HIGH (ref 11.5–15.5)
WBC: 10.7 10*3/uL — ABNORMAL HIGH (ref 4.0–10.5)
nRBC: 0 % (ref 0.0–0.2)

## 2021-08-10 LAB — APTT
aPTT: 30 seconds (ref 24–36)
aPTT: 33 seconds (ref 24–36)
aPTT: 47 seconds — ABNORMAL HIGH (ref 24–36)

## 2021-08-10 LAB — ECHOCARDIOGRAM COMPLETE
AR max vel: 2.66 cm2
AV Area VTI: 2.47 cm2
AV Area mean vel: 2.45 cm2
AV Mean grad: 3 mmHg
AV Peak grad: 4.8 mmHg
Ao pk vel: 1.1 m/s
Area-P 1/2: 3.07 cm2
Calc EF: 69.3 %
S' Lateral: 2.3 cm
Single Plane A2C EF: 69.5 %
Single Plane A4C EF: 67.6 %
Weight: 4412.73 oz

## 2021-08-10 LAB — TROPONIN I (HIGH SENSITIVITY)
Troponin I (High Sensitivity): 723 ng/L (ref <18)
Troponin I (High Sensitivity): 731 ng/L (ref <18)

## 2021-08-10 LAB — LIPID PANEL
Cholesterol: 223 mg/dL — ABNORMAL HIGH (ref 0–200)
HDL: 24 mg/dL — ABNORMAL LOW (ref >40)
LDL Cholesterol: 136 mg/dL — ABNORMAL HIGH (ref 0–99)
Total CHOL/HDL Ratio: 9.3 RATIO
Triglycerides: 313 mg/dL — ABNORMAL HIGH (ref <150)
VLDL: 63 mg/dL — ABNORMAL HIGH (ref 0–40)

## 2021-08-10 LAB — HEPARIN LEVEL (UNFRACTIONATED): Heparin Unfractionated: >1.1 IU/mL — ABNORMAL HIGH (ref 0.30–0.70)

## 2021-08-10 LAB — GLUCOSE, CAPILLARY
Glucose-Capillary: 190 mg/dL — ABNORMAL HIGH (ref 70–99)
Glucose-Capillary: 222 mg/dL — ABNORMAL HIGH (ref 70–99)
Glucose-Capillary: 176 mg/dL — ABNORMAL HIGH (ref 70–99)
Glucose-Capillary: 170 mg/dL — ABNORMAL HIGH (ref 70–99)

## 2021-08-10 MED ORDER — PERFLUTREN LIPID MICROSPHERE
1.0000 mL | INTRAVENOUS | Status: AC | PRN
Start: 2021-08-10 — End: 2021-08-10
  Administered 2021-08-10: 11:00:00 2 mL via INTRAVENOUS
  Filled 2021-08-10: qty 10

## 2021-08-10 MED ORDER — HEPARIN BOLUS VIA INFUSION
3000.0000 [IU] | Freq: Once | INTRAVENOUS | Status: AC
  Administered 2021-08-10: 03:00:00 3000 [IU] via INTRAVENOUS
  Filled 2021-08-10: qty 3000

## 2021-08-10 MED ORDER — ASPIRIN EC 81 MG PO TBEC
81.0000 mg | DELAYED_RELEASE_TABLET | Freq: Every day | ORAL | Status: DC
  Administered 2021-08-11: 09:00:00 81 mg via ORAL
  Filled 2021-08-10 (×2): qty 1

## 2021-08-10 MED ORDER — SODIUM CHLORIDE 0.9% FLUSH: 3.0000 mL | INTRAVENOUS | Status: DC | PRN

## 2021-08-10 MED ORDER — SODIUM CHLORIDE 0.9 % IV SOLN: INTRAVENOUS | Status: DC

## 2021-08-10 MED ORDER — SODIUM CHLORIDE 0.9% FLUSH
3.0000 mL | Freq: Two times a day (BID) | INTRAVENOUS | Status: DC
  Administered 2021-08-10 – 2021-08-13 (×5): 3 mL via INTRAVENOUS

## 2021-08-10 MED ORDER — DEXTROMETHORPHAN POLISTIREX ER 30 MG/5ML PO SUER
60.0000 mg | Freq: Every evening | ORAL | Status: DC | PRN
  Filled 2021-08-10: qty 10

## 2021-08-10 MED ORDER — HEPARIN BOLUS VIA INFUSION
3000.0000 [IU] | Freq: Once | INTRAVENOUS | Status: AC
  Administered 2021-08-10: 13:00:00 3000 [IU] via INTRAVENOUS
  Filled 2021-08-10: qty 3000

## 2021-08-10 MED ORDER — ICOSAPENT ETHYL 1 G PO CAPS
1.0000 g | ORAL_CAPSULE | Freq: Two times a day (BID) | ORAL | Status: DC
  Administered 2021-08-10 – 2021-08-13 (×6): 1 g via ORAL
  Filled 2021-08-10 (×9): qty 1

## 2021-08-10 MED ORDER — MELATONIN 5 MG PO TABS
10.0000 mg | ORAL_TABLET | Freq: Every evening | ORAL | Status: DC | PRN
  Administered 2021-08-11 (×2): 10 mg via ORAL
  Filled 2021-08-10 (×2): qty 2

## 2021-08-10 MED ORDER — SODIUM CHLORIDE 0.9 % IV SOLN: 250.0000 mL | INTRAVENOUS | Status: DC | PRN

## 2021-08-10 MED ORDER — SIMVASTATIN 20 MG PO TABS
20.0000 mg | ORAL_TABLET | Freq: Every day | ORAL | Status: DC
  Administered 2021-08-10 – 2021-08-12 (×3): 20 mg via ORAL
  Filled 2021-08-10 (×4): qty 1

## 2021-08-10 MED ORDER — HEPARIN (PORCINE) 25000 UT/250ML-% IV SOLN
2350.0000 [IU]/h | INTRAVENOUS | Status: DC
Start: 2021-08-10 — End: 2021-08-11
  Administered 2021-08-10: 18:00:00 2000 [IU]/h via INTRAVENOUS
  Filled 2021-08-10 (×3): qty 250

## 2021-08-10 NOTE — Progress Notes (Signed)
Pt arrived to room via stretcher with [facility].

## 2021-08-10 NOTE — Progress Notes (Addendum)
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  Patient reports he started having multiple episodes of nausea and vomiting with crampy abdominal pain yesterday.  He also reports having substernal chest tightness associated with left arm numbness that resolved after 2 to 3 hours. Patient continued to vomit throughout the day.  Patient had similar presentation few years back and found to have a small bowel obstruction. CT abdomen and pelvis shows findings concerning with ileus versus partial small bowel obstruction.  NG tube was placed.  General surgery consulted.  Patient able to pass flatus.  Ileus has resolved.  He is found to have elevated troponins, Cardiology is consulted recommended continue to trend troponins , Continue IV heparin and echocardiogram.  Assessment & Plan:   Principal Problem:   Ileus ([place]) Active Problems:   Non-ST elevation (NSTEMI) myocardial infarction ([place])   Type 2 diabetes mellitus with hyperlipidemia (H[facility])   Hypertriglyceridemia   Intractable abdominal pain   Nausea and vomiting   History of pulmonary embolism   Statin intolerance  Intractable nausea, vomiting, abdominal pain secondary to SBO: Patient presented with intractable nausea, vomiting, abdominal pain.   CT A/P : consistent with ileus versus partial small bowel obstruction. Patient kept n.p.o. , started on NG tube and IV fluids. Patient able to pass flatus.  SBO is improving. General surgery signed off,  SBO seems to be resolved. Start clear liquid diet and advance as tolerated.  NSTEMI: Patient presented with epigastric discomfort with EKG changes. High-sensitivity troponins uptrending. Cardiology consulted,  recommended to continue IV heparin as per ACS protocol. Continue aspirin, Zetia,  obtain 2D echocardiogram. Patient may require intervention if he continues to have chest pain.  Hyperlipidemia: Continue Zetia  GERD: Continue Protonix  Chronic HFpEF: Resume home medications since SBO is now resolved. Continue Coreg. Obtain 2D Echo.  History of DVT and PE: Resume oral anticoagulation.  Will resume Eliquis.  DM2: Continue regular insulin sliding scale.    DVT prophylaxis: IV heparin Code Status: (Full code. Family Communication: Wife in the room Disposition Plan:   Status is: Observation  The patient remains OBS appropriate and will d/c before 2 midnights.  Admitted for small bowel obstruction and NSTEMI.  SBO is improving.  Following cardiology , remains on IV heparin.  May require intervention.    Consultants:  General surgery Cardiology  Procedures:   Antimicrobials:  Anti-infectives (From admission, onward)    Start     Dose/Rate Route Frequency Ordered Stop   08/09/21 2200  doxycycline (VIBRA-TABS) tablet 100 mg        100 mg Oral 2 times daily 08/09/21 1508 08/10/21 2359       Subjective: Patient was seen and examined at bedside.  Overnight events noted.   Patient reports feeling better,  He is able to pass flatus.  Heart rate is controlled.   He denies any chest pain or shortness of breath.  Objective: Vitals:   08/09/21 1730 08/09/21 2125 08/10/21 0119 08/10/21 0558  BP: 131/60 135/70 116/67 117/68  Pulse: 71 73 70 71  Resp: 19 (!) 22 16 16   Temp: 98.6 F (37 C) (!) 97.5 F (36.4 C) (!) 97.5 F (36.4 C) (!) 97.5 F (36.4 C)  TempSrc:  Oral Oral Oral Oral  SpO2: 96% 96% 96% 94%  Weight:        Intake/Output Summary (Last 24 hours) at 08/10/2021 1045 Last data filed at 08/10/2021 0400 Gross per 24 hour  Intake 216.49 ml  Output --  Net 216.49 ml   Filed Weights   08/09/21 1656  Weight: 125.1 kg    Examination:  General exam:  Appears comfortable, not in any acute distress.  Deconditioned. Respiratory system: Clear to auscultation bilaterally, respiratory effort normal, RR 15. Cardiovascular system: S1-S2 heard, regular rate and rhythm, no murmur. Gastrointestinal system: Abdomen is soft, nontender, nondistended, BS+ Central nervous system: Alert and oriented X 3. No focal neurological deficits. Extremities: No edema, no cyanosis, no clubbing. Skin: No rashes, lesions or ulcers Psychiatry: Judgement and insight appear normal. Mood & affect appropriate.     Data Reviewed: I have personally reviewed following labs and imaging studies  CBC: Recent Labs  Lab 08/09/21 0801 08/10/21 0204  WBC 7.1 10.7*  NEUTROABS 4.3  --   HGB 13.4 13.4  HCT 41.0 41.9  MCV 94.5 94.6  PLT 271 [id number]   Basic Metabolic Panel: Recent Labs  Lab 08/09/21 0801  NA 136  K 4.3  CL 100  CO2 27  GLUCOSE 213*  BUN 33*  CREATININE 1.28*  CALCIUM 9.3   GFR: Estimated Creatinine Clearance: 72.3 mL/min (A) (by C-G formula based on SCr of 1.28 mg/dL (H)). Liver Function Tests: Recent Labs  Lab 08/09/21 0801  AST 23  ALT 21  ALKPHOS 42  BILITOT 0.4  PROT 7.1  ALBUMIN 3.3*   Recent Labs  Lab 08/09/21 0801  LIPASE 29   No results for input(s): AMMONIA in the last 168 hours. Coagulation Profile: Recent Labs  Lab 08/09/21 1619  INR 1.2   Cardiac Enzymes: No results for input(s): CKTOTAL, CKMB, CKMBINDEX, TROPONINI in the last 168 hours. BNP (last 3 results) No results for input(s): PROBNP in the last 8760 hours. HbA1C: No results for input(s): HGBA1C in the last 72 hours. CBG: Recent Labs  Lab 08/09/21 0759 08/09/21 1732 08/09/21 2126 08/10/21 0752  GLUCAP 202* 198* 173* 190*   Lipid Profile: Recent Labs    08/10/21 0204  CHOL 223*  HDL 24*  LDLCALC 136*  TRIG 313*  CHOLHDL 9.3   Thyroid Function Tests: No results for input(s): TSH, T4TOTAL, FREET4, T3FREE, THYROIDAB in the last 72 hours. Anemia  Panel: No results for input(s): VITAMINB12, FOLATE, FERRITIN, TIBC, IRON, RETICCTPCT in the last 72 hours. Sepsis Labs: No results for input(s): PROCALCITON, LATICACIDVEN in the last 168 hours.  Recent Results (from the past 240 hour(s))  Resp Panel by RT-PCR (Flu A&B, Covid) Nasopharyngeal Swab     Status: None   Collection Time: 08/09/21  8:14 AM   Specimen: Nasopharyngeal Swab; Nasopharyngeal(NP) swabs in vial transport medium  Result Value Ref Range Status   SARS Coronavirus 2 by RT PCR NEGATIVE NEGATIVE Final    Comment: (NOTE) SARS-CoV-2 target nucleic acids are NOT DETECTED.  The SARS-CoV-2 RNA is generally detectable in upper respiratory specimens during the acute phase of infection. The lowest concentration of SARS-CoV-2 viral copies this assay can detect is 138 copies/mL. A negative result does not preclude SARS-Cov-2 infection and should not be used as the sole basis for treatment or other patient management decisions. A negative result may occur with  improper specimen collection/handling, submission of specimen other than nasopharyngeal swab, presence of viral mutation(s) within the areas targeted by this assay, and  inadequate number of viral copies(<138 copies/mL). A negative result must be combined with clinical observations, patient history, and epidemiological information. The expected result is Negative.  Fact Sheet for Patients:  EntrepreneurPulse.com.au  Fact Sheet for Healthcare Providers:  IncredibleEmployment.be  This test is no t yet approved or cleared by the [place] FDA and  has been authorized for detection and/or diagnosis of SARS-CoV-2 by FDA under an Emergency Use Authorization (EUA). This EUA will remain  in effect (meaning this test can be used) for the duration of the COVID-19 declaration under Section 564(b)(1) of the Act, 21 U.S.C.section 360bbb-3(b)(1), unless the authorization is terminated  or  revoked sooner.       Influenza A by PCR NEGATIVE NEGATIVE Final   Influenza B by PCR NEGATIVE NEGATIVE Final    Comment: (NOTE) The Xpert Xpress SARS-CoV-2/FLU/RSV plus assay is intended as an aid in the diagnosis of influenza from Nasopharyngeal swab specimens and should not be used as a sole basis for treatment. Nasal washings and aspirates are unacceptable for Xpert Xpress SARS-CoV-2/FLU/RSV testing.  Fact Sheet for Patients: EntrepreneurPulse.com.au  Fact Sheet for Healthcare Providers: IncredibleEmployment.be  This test is not yet approved or cleared by the [place] FDA and has been authorized for detection and/or diagnosis of SARS-CoV-2 by FDA under an Emergency Use Authorization (EUA). This EUA will remain in effect (meaning this test can be used) for the duration of the COVID-19 declaration under Section 564(b)(1) of the Act, 21 U.S.C. section 360bbb-3(b)(1), unless the authorization is terminated or revoked.  Performed at [facility], [address] [place], [place] [address]     Radiology Studies: CT Abdomen Pelvis W Contrast  Result Date: 08/09/2021 CLINICAL DATA:  Abdominal pain EXAM: CT ABDOMEN AND PELVIS WITH CONTRAST TECHNIQUE: Multidetector CT imaging of the abdomen and pelvis was performed using the standard protocol following bolus administration of intravenous contrast. CONTRAST:  164mL OMNIPAQUE IOHEXOL 300 MG/ML  SOLN COMPARISON:  05/01/2021 FINDINGS: Lower chest: Small linear densities in the lower lung fields may suggest scarring or subsegmental atelectasis. Small pericardial effusion is present. Hepatobiliary: No focal abnormality is seen in the liver. Surgical clips are seen in gallbladder fossa. There is no dilation of bile ducts. Pancreas: No focal abnormality is seen. Spleen: Unremarkable. Adrenals/Urinary Tract: Adrenals are unremarkable. There is no hydronephrosis. There is 7.9 cm smooth marginated  low-density lesion in the lower pole of left kidney suggesting renal cysts. There is small 2.1 cm fluid density structure in the margin of lower pole of right kidney, possibly an exophytic renal cyst. There is no hydronephrosis. There are no renal or ureteral stones. Urinary bladder is unremarkable. Stomach/Bowel: Stomach is unremarkable. There is mild dilation of distal jejunum measuring up to 3.6 cm in diameter. Proximal jejunum and distal ileum are not distended. There is interval decrease in degree of small bowel dilation in comparison with the study 05/01/2021. Appendix is not dilated. There is no significant wall thickening in colon. There is no pericolic stranding. Vascular/Lymphatic: Scattered arterial calcifications are seen. No new significant lymphadenopathy seen. Reproductive: Unremarkable. Other: There is no ascites or pneumoperitoneum. Left inguinal hernia containing fat is seen. There is subcutaneous edema in the upper abdominal wall without loculated fluid collections. Musculoskeletal: Unremarkable. IMPRESSION: There is mild dilation of few small bowel loops in the mid abdomen, possibly suggesting ileus or partial small bowel obstruction. There is interval decrease in degree of small bowel dilation since the study done on 05/01/2021. There is no pneumoperitoneum. There  is no hydronephrosis. Small pericardial effusion. Bilateral renal cysts. Other findings as described in the body of the report. Electronically Signed   By: Ernie Avena M.D.   On: 08/09/2021 09:23   DG Chest Portable 1 View  Result Date: 08/09/2021 CLINICAL DATA:  Chest X-Ray was ordered for NG tube placement EXAM: PORTABLE CHEST - 1 VIEW COMPARISON:  Earlier film of the same day FINDINGS: Gastric tube is been advanced into the decompressed stomach. Lungs are clear. Leftward tracheal deviation at the thoracic inlet consistent with goiter described on previous studies from 02/29/2020 and previous. Heart size upper limits  normal. Aortic Atherosclerosis (ICD10-170.0). Blunting of the left lateral costophrenic angle.  No pneumothorax. Visualized bones unremarkable. IMPRESSION: Gastric tube to the decompressed stomach. Electronically Signed   By: Corlis Leak M.D.   On: 08/09/2021 11:52   DG Chest Portable 1 View  Result Date: 08/09/2021 CLINICAL DATA:  Chest pain. EXAM: PORTABLE CHEST 1 VIEW COMPARISON:  05/04/2021 FINDINGS: Heart size is normal. Left superior mediastinal mass is unchanged and consistent with a substernal goiter. Aortic atherosclerotic calcification noted. Low lung volumes are seen, however both lungs are clear. IMPRESSION: Low lung volumes. No active cardiopulmonary disease. Stable substernal goiter. Electronically Signed   By: Danae Orleans M.D.   On: 08/09/2021 08:24   DG Abd Portable 2V  Result Date: 08/10/2021 CLINICAL DATA:  Small bowel obstruction. EXAM: PORTABLE ABDOMEN - 2 VIEW COMPARISON:  CT of the abdomen from yesterday FINDINGS: The enteric tube tip is at the proximal stomach with side port at the lower esophagus. Continued gas dilated small bowel similar to scanogram from CT. Stool is seen throughout much of the colon. Small contrast filled bladder. IMPRESSION: 1. Ongoing small bowel obstruction without detected change from CT yesterday. 2. Enteric tube with tip at the stomach and side port at the lower esophagus. Electronically Signed   By: Tiburcio Pea M.D.   On: 08/10/2021 08:40    Scheduled Meds:  aspirin  325 mg Oral Daily   carvedilol  12.5 mg Oral BID   doxycycline  100 mg Oral BID   ezetimibe  10 mg Oral Daily   fenofibrate  160 mg Oral Daily   fluticasone  1 spray Each Nare Daily   insulin aspart  0-15 Units Subcutaneous TID WC   insulin aspart  0-5 Units Subcutaneous QHS   latanoprost  1 drop Left Eye QHS   metoCLOPramide  2.5 mg Oral QHS   montelukast  10 mg Oral QHS   pantoprazole  40 mg Oral Daily   sodium chloride  1 application Both Eyes QHS   Continuous  Infusions:  heparin 1,800 Units/hr (08/10/21 0558)     LOS: 0 days    Time spent: 35 mins    Velma Agnes, MD [facility]   If 7PM-7AM, please contact night-coverage

## 2021-08-10 NOTE — Progress Notes (Signed)
ANTICOAGULATION CONSULT NOTE   Pharmacy Consult for heparin Indication: chest pain/ACS, history of DVT/PE  Allergies  Allergen Reactions   Metoclopramide Itching    Loss of Balance; Urinary Incontinence   Nsaids     Other reaction(s): Other (See Comments) Renal Insufficiency Kidney issues    Amoxicillin-Pot Clavulanate Diarrhea and Nausea And Vomiting    Other reaction(s): Other (See Comments) Abdomen Pain Abdomen pain    Statins     Other reaction(s): Other (See Comments) Myalgias and Myositis myalgias    Allopurinol Rash   Empagliflozin     Other reaction(s): Other (See Comments) Fainting, weakness, lightheaded, falling   Tiotropium     Other reaction(s): Other (See Comments) Urinary retention    Patient Measurements: Height: 6\' 1"  Weight: 125.1 kg (275 lb 12.7 oz) Heparin Dosing Weight: 107.5 kg  Vital Signs: Temp: 97.5 F (36.4 C) (11/27 0119) Temp Source: Oral (11/27 0119) BP: 116/67 (11/27 0119) Pulse Rate: 70 (11/27 0119)  Labs: Recent Labs    08/09/21 0801 08/09/21 0946 08/09/21 1157 08/09/21 1435 08/09/21 1619 08/10/21 0204  HGB 13.4  --   --   --   --  13.4  HCT 41.0  --   --   --   --  41.9  PLT 271  --   --   --   --  292  APTT  --   --   --   --  29 30  LABPROT  --   --   --   --  14.7  --   INR  --   --   --   --  1.2  --   HEPARINUNFRC  --   --   --   --  >1.10* >1.10*  CREATININE 1.28*  --   --   --   --   --   TROPONINIHS 46*   < > 190* 234* 280*  --    < > = values in this interval not displayed.     Estimated Creatinine Clearance: 72.3 mL/min (A) (by C-G formula based on SCr of 1.28 mg/dL (H)).   Medical History: Past Medical History:  Diagnosis Date   Asthma    CHF (congestive heart failure) (HCC)    Diabetes mellitus without complication (HCC)    Hypertension     Assessment: 58 y/oM with PMH of DVT/PE on Apixaban admitted with chest pain and pSBO. High sensitivity troponin 46 > 86 > 190 > 234 > 280. Pharmacy  consulted for IV heparin dosing. Last dose of Apixaban 5mg  PO BID reported as 08/08/21 at 2230. CBC WNL. Note that recent Apixaban use can falsely elevate heparin level.   08/10/21 Heparin level > 1.1 (level falsely elevated due to recent apixaban) aPTT = 30 sec (subtherapeutic) with heparin gtt @ 1500 units/hr RN confirmed no interruption in heparin therapy and no line issues.  No bleeding complications noted. CBC wnl  Goal of Therapy:  Heparin level 0.3-0.7 units/ml aPTT 66-102 seconds Monitor platelets by anticoagulation protocol: Yes   Plan:  Rebolus heparin 3000 units IV x 1 then increase heparin gtt to 1800 units/hr Check aPTT 8 hr after rate increase Will adjust/titrate heparin using aPTT until Apixaban has cleared from system Daily CBC, heparin level, aPTT Monitor closely for s/sx of bleeding   08/10/21, PharmD 08/10/2021,3:23 AM

## 2021-08-10 NOTE — Progress Notes (Signed)
Date and time results received: 08/09/21 1753 (use smartphrase ".now" to insert current time)  Test: Troponin Critical Value: 280  Name of Provider Notified: Ronaldo Miyamoto, MD  Orders Received? Or Actions Taken?: No new orders at this time

## 2021-08-10 NOTE — Progress Notes (Addendum)
Date and time results received: 08/10/21 1125  (use smartphrase ".now" to insert current time)  Test: Troponin Critical Value: 723  Name of Provider Notified: Cardiologist Tessa Lerner, MD and Attending Cipriano Bunker, MD  Orders Received? Or Actions Taken?: No new orders at this time

## 2021-08-10 NOTE — Progress Notes (Signed)
ANTICOAGULATION CONSULT NOTE   Pharmacy Consult for heparin Indication: chest pain/ACS, history of DVT/PE  Allergies  Allergen Reactions   Metoclopramide Itching    Loss of Balance; Urinary Incontinence   Nsaids     Other reaction(s): Other (See Comments) Renal Insufficiency Kidney issues    Amoxicillin-Pot Clavulanate Diarrhea and Nausea And Vomiting    Other reaction(s): Other (See Comments) Abdomen Pain Abdomen pain    Statins     Other reaction(s): Other (See Comments) Myalgias and Myositis myalgias    Allopurinol Rash   Empagliflozin     Other reaction(s): Other (See Comments) Fainting, weakness, lightheaded, falling   Tiotropium     Other reaction(s): Other (See Comments) Urinary retention    Patient Measurements: Height: 6\' 1"  Weight: 125.1 kg (275 lb 12.7 oz) Heparin Dosing Weight: 107.5 kg  Vital Signs: Temp: 97.5 F (36.4 C) (11/27 0558) Temp Source: Oral (11/27 0558) BP: 115/69 (11/27 1122) Pulse Rate: 63 (11/27 1122)  Labs: Recent Labs    08/09/21 0801 08/09/21 0946 08/09/21 1435 08/09/21 1619 08/10/21 0204 08/10/21 1038  HGB 13.4  --   --   --  13.4  --   HCT 41.0  --   --   --  41.9  --   PLT 271  --   --   --  292  --   APTT  --   --   --  29 30 33  LABPROT  --   --   --  14.7  --   --   INR  --   --   --  1.2  --   --   HEPARINUNFRC  --   --   --  >1.10* >1.10*  --   CREATININE 1.28*  --   --   --   --   --   TROPONINIHS 46*   < > 234* 280*  --  723*   < > = values in this interval not displayed.     Estimated Creatinine Clearance: 72.3 mL/min (A) (by C-G formula based on SCr of 1.28 mg/dL (H)).   Medical History: Past Medical History:  Diagnosis Date   Asthma    CHF (congestive heart failure) (HCC)    Diabetes mellitus without complication (HCC)    Hypertension     Assessment: 24 y/oM with PMH of DVT/PE on Apixaban admitted with chest pain and pSBO. High sensitivity troponin 46 > 86 > 190 > 234 > 280. Pharmacy consulted  for IV heparin dosing. Last dose of Apixaban 5mg  PO BID reported as 08/08/21 at 2230. CBC WNL. Note that recent Apixaban use can falsely elevate heparin level.   08/10/21 Heparin level > 1.1 (level falsely elevated due to recent apixaban) aPTT = 33 sec (subtherapeutic) with heparin gtt @ 1800 units/hr RN confirmed no interruption in heparin therapy and no line issues.  No bleeding complications noted. CBC wnl  Goal of Therapy:  Heparin level 0.3-0.7 units/ml aPTT 66-102 seconds Monitor platelets by anticoagulation protocol: Yes   Plan:  Rebolus heparin 3000 units IV x 1 then increase heparin gtt to 2000 units/hr Check aPTT 8 hr after rate increase Will adjust/titrate heparin using aPTT until Apixaban has cleared from system Daily CBC, heparin level, aPTT Monitor closely for s/sx of bleeding   08/10/21, PharmD, BCPS 08/10/2021 12:30 PM

## 2021-08-10 NOTE — Progress Notes (Signed)
Pt being transferred to [facility] 3E room 16. Pt being transferred via carelink. RN called report to RN at [facility].

## 2021-08-10 NOTE — Care Plan (Signed)
Troponin trending up, Patient reports chest pain has resolved.  SBO has improved,  Started on clear liquid diet.  Cardiology recommended transfer to [facility] for possible left heart cath in the morning.

## 2021-08-10 NOTE — Progress Notes (Signed)
Subjective/Chief Complaint:   NG tube has been clamped since yesterday - no nausea, vomiting, or bloating Passing flatus No abdominal complaints Troponins high - undergoing cardiology work-up  Objective: Vital signs in last 24 hours: Temp:  [97.5 F (36.4 C)-98.6 F (37 C)] 97.5 F (36.4 C) (11/27 0558) Pulse Rate:  [62-78] 71 (11/27 0558) Resp:  [15-22] 16 (11/27 0558) BP: (111-150)/(60-78) 117/68 (11/27 0558) SpO2:  [94 %-98 %] 94 % (11/27 0558) Weight:  [125.1 kg] 125.1 kg (11/26 1656) Last BM Date: 08/07/21  Intake/Output from previous day: 11/26 0701 - 11/27 0700 In: 1216.6 [I.V.:216.5; IV Piggyback:1000.2] Out: -  Intake/Output this shift: No intake/output data recorded.  General appearance: alert, cooperative, and no distress GI: obese, soft, + BS, non-tender; non-distended  Lab Results:  Recent Labs    08/09/21 0801 08/10/21 0204  WBC 7.1 10.7*  HGB 13.4 13.4  HCT 41.0 41.9  PLT 271 292   BMET Recent Labs    08/09/21 0801  NA 136  K 4.3  CL 100  CO2 27  GLUCOSE 213*  BUN 33*  CREATININE 1.28*  CALCIUM 9.3   PT/INR Recent Labs    08/09/21 1619  LABPROT 14.7  INR 1.2   ABG No results for input(s): PHART, HCO3 in the last 72 hours.  Invalid input(s): PCO2, PO2  Studies/Results: CT Abdomen Pelvis W Contrast  Result Date: 08/09/2021 CLINICAL DATA:  Abdominal pain EXAM: CT ABDOMEN AND PELVIS WITH CONTRAST TECHNIQUE: Multidetector CT imaging of the abdomen and pelvis was performed using the standard protocol following bolus administration of intravenous contrast. CONTRAST:  100mL OMNIPAQUE IOHEXOL 300 MG/ML  SOLN COMPARISON:  05/01/2021 FINDINGS: Lower chest: Small linear densities in the lower lung fields may suggest scarring or subsegmental atelectasis. Small pericardial effusion is present. Hepatobiliary: No focal abnormality is seen in the liver. Surgical clips are seen in gallbladder fossa. There is no dilation of bile ducts. Pancreas:  No focal abnormality is seen. Spleen: Unremarkable. Adrenals/Urinary Tract: Adrenals are unremarkable. There is no hydronephrosis. There is 7.9 cm smooth marginated low-density lesion in the lower pole of left kidney suggesting renal cysts. There is small 2.1 cm fluid density structure in the margin of lower pole of right kidney, possibly an exophytic renal cyst. There is no hydronephrosis. There are no renal or ureteral stones. Urinary bladder is unremarkable. Stomach/Bowel: Stomach is unremarkable. There is mild dilation of distal jejunum measuring up to 3.6 cm in diameter. Proximal jejunum and distal ileum are not distended. There is interval decrease in degree of small bowel dilation in comparison with the study 05/01/2021. Appendix is not dilated. There is no significant wall thickening in colon. There is no pericolic stranding. Vascular/Lymphatic: Scattered arterial calcifications are seen. No new significant lymphadenopathy seen. Reproductive: Unremarkable. Other: There is no ascites or pneumoperitoneum. Left inguinal hernia containing fat is seen. There is subcutaneous edema in the upper abdominal wall without loculated fluid collections. Musculoskeletal: Unremarkable. IMPRESSION: There is mild dilation of few small bowel loops in the mid abdomen, possibly suggesting ileus or partial small bowel obstruction. There is interval decrease in degree of small bowel dilation since the study done on 05/01/2021. There is no pneumoperitoneum. There is no hydronephrosis. Small pericardial effusion. Bilateral renal cysts. Other findings as described in the body of the report. Electronically Signed   By: Ernie AvenaPalani  Rathinasamy M.D.   On: 08/09/2021 09:23   DG Chest Portable 1 View  Result Date: 08/09/2021 CLINICAL DATA:  Chest X-Ray was ordered for NG  tube placement EXAM: PORTABLE CHEST - 1 VIEW COMPARISON:  Earlier film of the same day FINDINGS: Gastric tube is been advanced into the decompressed stomach. Lungs are  clear. Leftward tracheal deviation at the thoracic inlet consistent with goiter described on previous studies from 02/29/2020 and previous. Heart size upper limits normal. Aortic Atherosclerosis (ICD10-170.0). Blunting of the left lateral costophrenic angle.  No pneumothorax. Visualized bones unremarkable. IMPRESSION: Gastric tube to the decompressed stomach. Electronically Signed   By: Corlis Leak M.D.   On: 08/09/2021 11:52   DG Chest Portable 1 View  Result Date: 08/09/2021 CLINICAL DATA:  Chest pain. EXAM: PORTABLE CHEST 1 VIEW COMPARISON:  05/04/2021 FINDINGS: Heart size is normal. Left superior mediastinal mass is unchanged and consistent with a substernal goiter. Aortic atherosclerotic calcification noted. Low lung volumes are seen, however both lungs are clear. IMPRESSION: Low lung volumes. No active cardiopulmonary disease. Stable substernal goiter. Electronically Signed   By: Danae Orleans M.D.   On: 08/09/2021 08:24    Anti-infectives: Anti-infectives (From admission, onward)    Start     Dose/Rate Route Frequency Ordered Stop   08/09/21 2200  doxycycline (VIBRA-TABS) tablet 100 mg        100 mg Oral 2 times daily 08/09/21 1508 08/10/21 2359       Assessment/Plan: Abdominal distention - ileus - appears to have resolved  D/C NG tube Begin clear liquids - advance as tolerated OK for anticoagulation for cardiology indications Will sign off for now.  Please call us back if any new issues arise  LOS: 0 days    Wynona Luna 08/10/2021

## 2021-08-10 NOTE — Progress Notes (Signed)
Progress Note  Patient Name: Randy Liu Date of Encounter: 08/10/2021  Attending physician: Cipriano Bunker, MD Primary care provider: Felton Clinton, MD  Subjective: Randy Liu is a 70 y.o. male who was seen and examined at bedside  No chest pain or shortness of breath NG tube d/c and started on clear liquid diet.  Wife at bedside.  Case discussed and reviewed with his nurse.  Objective: Vital Signs in the last 24 hours: Temp:  [97.5 F (36.4 C)-98.6 F (37 C)] 97.5 F (36.4 C) (11/27 0558) Pulse Rate:  [63-73] 63 (11/27 1122) Resp:  [16-22] 16 (11/27 0558) BP: (115-135)/(60-70) 115/69 (11/27 1122) SpO2:  [94 %-96 %] 94 % (11/27 0558) Weight:  [125.1 kg] 125.1 kg (11/26 1656)  Intake/Output:  Intake/Output Summary (Last 24 hours) at 08/10/2021 1354 Last data filed at 08/10/2021 0400 Gross per 24 hour  Intake 216.49 ml  Output --  Net 216.49 ml    Net IO Since Admission: 1,216.64 mL [08/10/21 1354]  Weights:  Filed Weights   08/09/21 1656  Weight: 125.1 kg    Telemetry: Personally reviewed. NSR.   Physical examination: PHYSICAL EXAM: Vitals with BMI 08/10/2021 08/10/2021 08/10/2021  Height - - -  Weight - - -  BMI - - -  Systolic 115 117 203  Diastolic 69 68 67  Pulse 63 71 70    CONSTITUTIONAL: Appears older than stated age, hemodynamically stable, well-developed and well-nourished. No acute distress.  SKIN: Skin is warm and dry. No rash noted. No cyanosis. No pallor. No jaundice HEAD: Normocephalic and atraumatic.  EYES: No scleral icterus MOUTH/THROAT: Moist oral membranes.  NECK: No JVD present. No thyromegaly noted. No carotid bruits  LYMPHATIC: No visible cervical adenopathy.  CHEST Normal respiratory effort. No intercostal retractions  LUNGS: Clear to auscultation bilaterally.  No stridor. No wheezes. No rales.  CARDIOVASCULAR: Regular rate and rhythm, positive S1-S2, no murmurs rubs or gallops appreciated ABDOMINAL: Obese,  soft, nontender, nondistended, hypoactive bowel sounds in all 4 quadrants, no ascites.   EXTREMITIES: No pitting edema, warm to touch.  HEMATOLOGIC: No significant bruising NEUROLOGIC: Oriented to person, place, and time. Nonfocal. Normal muscle tone.  PSYCHIATRIC: Normal mood and affect. Normal behavior. Cooperative  Lab Results: Hematology Recent Labs  Lab 08/09/21 0801 08/10/21 0204  WBC 7.1 10.7*  RBC 4.34 4.43  HGB 13.4 13.4  HCT 41.0 41.9  MCV 94.5 94.6  MCH 30.9 30.2  MCHC 32.7 32.0  RDW 15.4 15.8*  PLT 271 292    Chemistry Recent Labs  Lab 08/09/21 0801  NA 136  K 4.3  CL 100  CO2 27  GLUCOSE 213*  BUN 33*  CREATININE 1.28*  CALCIUM 9.3  PROT 7.1  ALBUMIN 3.3*  AST 23  ALT 21  ALKPHOS 42  BILITOT 0.4  GFRNONAA 59*  ANIONGAP 9     Cardiac Enzymes: Cardiac Panel (last 3 results) Recent Labs    08/09/21 1435 08/09/21 1619 08/10/21 1038  TROPONINIHS 234* 280* 723*    BNP (last 3 results) Recent Labs    08/09/21 1435  BNP 138.4*    ProBNP (last 3 results) No results for input(s): PROBNP in the last 8760 hours.   DDimer No results for input(s): DDIMER in the last 168 hours.   Hemoglobin A1c:  Lab Results  Component Value Date   HGBA1C 7.9 (H) 05/02/2021   MPG 180.03 05/02/2021    TSH No results for input(s): TSH in the last 8760 hours.  Lipid Panel  Component Value Date/Time   CHOL 223 (H) 08/10/2021 0204   TRIG 313 (H) 08/10/2021 0204   HDL 24 (L) 08/10/2021 0204   CHOLHDL 9.3 08/10/2021 0204   VLDL 63 (H) 08/10/2021 0204   LDLCALC 136 (H) 08/10/2021 0204    Imaging: CT Abdomen Pelvis W Contrast  Result Date: 08/09/2021 CLINICAL DATA:  Abdominal pain EXAM: CT ABDOMEN AND PELVIS WITH CONTRAST TECHNIQUE: Multidetector CT imaging of the abdomen and pelvis was performed using the standard protocol following bolus administration of intravenous contrast. CONTRAST:  127mL OMNIPAQUE IOHEXOL 300 MG/ML  SOLN COMPARISON:   05/01/2021 FINDINGS: Lower chest: Small linear densities in the lower lung fields may suggest scarring or subsegmental atelectasis. Small pericardial effusion is present. Hepatobiliary: No focal abnormality is seen in the liver. Surgical clips are seen in gallbladder fossa. There is no dilation of bile ducts. Pancreas: No focal abnormality is seen. Spleen: Unremarkable. Adrenals/Urinary Tract: Adrenals are unremarkable. There is no hydronephrosis. There is 7.9 cm smooth marginated low-density lesion in the lower pole of left kidney suggesting renal cysts. There is small 2.1 cm fluid density structure in the margin of lower pole of right kidney, possibly an exophytic renal cyst. There is no hydronephrosis. There are no renal or ureteral stones. Urinary bladder is unremarkable. Stomach/Bowel: Stomach is unremarkable. There is mild dilation of distal jejunum measuring up to 3.6 cm in diameter. Proximal jejunum and distal ileum are not distended. There is interval decrease in degree of small bowel dilation in comparison with the study 05/01/2021. Appendix is not dilated. There is no significant wall thickening in colon. There is no pericolic stranding. Vascular/Lymphatic: Scattered arterial calcifications are seen. No new significant lymphadenopathy seen. Reproductive: Unremarkable. Other: There is no ascites or pneumoperitoneum. Left inguinal hernia containing fat is seen. There is subcutaneous edema in the upper abdominal wall without loculated fluid collections. Musculoskeletal: Unremarkable. IMPRESSION: There is mild dilation of few small bowel loops in the mid abdomen, possibly suggesting ileus or partial small bowel obstruction. There is interval decrease in degree of small bowel dilation since the study done on 05/01/2021. There is no pneumoperitoneum. There is no hydronephrosis. Small pericardial effusion. Bilateral renal cysts. Other findings as described in the body of the report. Electronically Signed   By:  Elmer Picker M.D.   On: 08/09/2021 09:23   DG Chest Portable 1 View  Result Date: 08/09/2021 CLINICAL DATA:  Chest X-Ray was ordered for NG tube placement EXAM: PORTABLE CHEST - 1 VIEW COMPARISON:  Earlier film of the same day FINDINGS: Gastric tube is been advanced into the decompressed stomach. Lungs are clear. Leftward tracheal deviation at the thoracic inlet consistent with goiter described on previous studies from 02/29/2020 and previous. Heart size upper limits normal. Aortic Atherosclerosis (ICD10-170.0). Blunting of the left lateral costophrenic angle.  No pneumothorax. Visualized bones unremarkable. IMPRESSION: Gastric tube to the decompressed stomach. Electronically Signed   By: Lucrezia Europe M.D.   On: 08/09/2021 11:52   DG Chest Portable 1 View  Result Date: 08/09/2021 CLINICAL DATA:  Chest pain. EXAM: PORTABLE CHEST 1 VIEW COMPARISON:  05/04/2021 FINDINGS: Heart size is normal. Left superior mediastinal mass is unchanged and consistent with a substernal goiter. Aortic atherosclerotic calcification noted. Low lung volumes are seen, however both lungs are clear. IMPRESSION: Low lung volumes. No active cardiopulmonary disease. Stable substernal goiter. Electronically Signed   By: Marlaine Hind M.D.   On: 08/09/2021 08:24   DG Abd Portable 2V  Result Date: 08/10/2021 CLINICAL DATA:  Small bowel obstruction. EXAM: PORTABLE ABDOMEN - 2 VIEW COMPARISON:  CT of the abdomen from yesterday FINDINGS: The enteric tube tip is at the proximal stomach with side port at the lower esophagus. Continued gas dilated small bowel similar to scanogram from CT. Stool is seen throughout much of the colon. Small contrast filled bladder. IMPRESSION: 1. Ongoing small bowel obstruction without detected change from CT yesterday. 2. Enteric tube with tip at the stomach and side port at the lower esophagus. Electronically Signed   By: Jorje Guild M.D.   On: 08/10/2021 08:40   ECHOCARDIOGRAM COMPLETE  Result  Date: 08/10/2021    ECHOCARDIOGRAM REPORT   Patient Name:   Randy Liu Date of Exam: 08/09/2021 Medical Rec #:  [id number]          Height:       73.0 in Accession #:    [id number]         Weight:       263.0 lb Date of Birth:  1951-07-13          BSA:          2.417 m Patient Age:    56 years           BP:           131/60 mmHg Patient Gender: M                  HR:           73 bpm. Exam Location:  Inpatient Procedure: 2D Echo, Cardiac Doppler, Color Doppler and Intracardiac            Opacification Agent Indications:     Elevated troponin  History:         Patient has prior history of Echocardiogram examinations, most                  recent 12/13/2020. CHF, Signs/Symptoms:Chest Pain; Risk                  Factors:Hypertension, Diabetes and Dyslipidemia. Long haul                  Covid.  Sonographer:     Glo Herring Sonographer#2:   Merrie Roof RDCS Referring Phys:  [id number] Jonnie Finner Diagnosing Phys: Rex Kras DO IMPRESSIONS  1. Left ventricular ejection fraction, by estimation, is 55 to 60%. The left ventricle has normal function. Left ventricular endocardial border not optimally defined to evaluate regional wall motion. Left ventricular diastolic parameters are indeterminate 2D images were obtain on 08/09/2021 LVEF 45-50% w/ RWMA along the anterolateral and inferolateral walls. Definity images were obtained 08/10/2021 which notes improvement in LVEF 55-60% without regional wall motion abnormalities.  2. Right ventricular systolic function is normal. The right ventricular size is normal. Mildly increased right ventricular wall thickness.  3. A small pericardial effusion is present. There is no evidence of cardiac tamponade.  4. The mitral valve is normal in structure. Trivial mitral valve regurgitation. No evidence of mitral stenosis.  5. The aortic valve is tricuspid. Aortic valve regurgitation is not visualized. No aortic stenosis is present.  6. The inferior vena cava is normal in size with  <50% respiratory variability, suggesting right atrial pressure of 8 mmHg. FINDINGS  Left Ventricle: 2D images were obtain on 08/09/2021 LVEF 45-50% w/ RWMA along the anterolateral and inferolateral walls. Definity images were obtained 08/10/2021 which notes LVEF 55-60% without regional wall motion abnormalities. Left ventricular ejection fraction, by  estimation, is 55 to 60%. The left ventricle has normal function. Left ventricular endocardial border not optimally defined to evaluate regional wall motion. Definity contrast agent was given IV to delineate the left ventricular endocardial borders. The left ventricular internal cavity size was normal in size. There is no left ventricular hypertrophy. Left ventricular diastolic parameters are indeterminate. Right Ventricle: The right ventricular size is normal. Mildly increased right ventricular wall thickness. Right ventricular systolic function is normal. Left Atrium: Left atrial size was normal in size. Right Atrium: Right atrial size was normal in size. Pericardium: A small pericardial effusion is present. The pericardial effusion appears to contain mixed echogenic material. There is no evidence of cardiac tamponade. Mitral Valve: The mitral valve is normal in structure. Trivial mitral valve regurgitation. No evidence of mitral valve stenosis. Tricuspid Valve: The tricuspid valve is grossly normal. Tricuspid valve regurgitation is trivial. No evidence of tricuspid stenosis. Aortic Valve: The aortic valve is tricuspid. Aortic valve regurgitation is not visualized. No aortic stenosis is present. Aortic valve mean gradient measures 3.0 mmHg. Aortic valve peak gradient measures 4.8 mmHg. Aortic valve area, by VTI measures 2.47 cm. Pulmonic Valve: The pulmonic valve was not well visualized. Pulmonic valve regurgitation is trivial. No evidence of pulmonic stenosis. Aorta: The aortic root and ascending aorta are structurally normal, with no evidence of dilitation. Venous:  The inferior vena cava is normal in size with less than 50% respiratory variability, suggesting right atrial pressure of 8 mmHg. IAS/Shunts: The interatrial septum was not well visualized.  LEFT VENTRICLE PLAX 2D LVIDd:         3.50 cm     Diastology LVIDs:         2.30 cm     LV e' medial:    3.81 cm/s LV PW:         1.10 cm     LV E/e' medial:  16.4 LV IVS:        1.10 cm     LV e' lateral:   5.00 cm/s LVOT diam:     2.00 cm     LV E/e' lateral: 12.5 LV SV:         55 LV SV Index:   23 LVOT Area:     3.14 cm  LV Volumes (MOD) LV vol d, MOD A2C: 46.2 ml LV vol d, MOD A4C: 73.1 ml LV vol s, MOD A2C: 14.1 ml LV vol s, MOD A4C: 23.7 ml LV SV MOD A2C:     32.1 ml LV SV MOD A4C:     73.1 ml LV SV MOD BP:      45.2 ml RIGHT VENTRICLE             IVC RV S prime:     11.20 cm/s  IVC diam: 1.30 cm LEFT ATRIUM           Index LA diam:      4.80 cm 1.99 cm/m LA Vol (A2C): 43.1 ml 17.83 ml/m  AORTIC VALVE                    PULMONIC VALVE AV Area (Vmax):    2.66 cm     PV Vmax:       1.26 m/s AV Area (Vmean):   2.45 cm     PV Peak grad:  6.4 mmHg AV Area (VTI):     2.47 cm AV Vmax:           110.00 cm/s AV Vmean:  75.800 cm/s AV VTI:            0.224 m AV Peak Grad:      4.8 mmHg AV Mean Grad:      3.0 mmHg LVOT Vmax:         93.30 cm/s LVOT Vmean:        59.100 cm/s LVOT VTI:          0.176 m LVOT/AV VTI ratio: 0.79  AORTA Ao Root diam: 3.30 cm Ao Asc diam:  2.80 cm MITRAL VALVE MV Area (PHT): 3.07 cm    SHUNTS MV Decel Time: 247 msec    Systemic VTI:  0.18 m MV E velocity: 62.60 cm/s  Systemic Diam: 2.00 cm MV A velocity: 95.50 cm/s MV E/A ratio:  0.66 Tracie Dore DO Electronically signed by Rex Kras DO Signature Date/Time: 08/10/2021/1:43:56 PM    Final     CARDIAC DATABASE: EKG: 08/09/2021: NSR, 66 bpm, left axis deviation, poor R wave progression, without underlying injury pattern.   08/09/2021: NSR, 67 bpm, ST depressions in the high lateral leads suggestive of lateral ischemia.  08/10/2021:  NSR, 73bpm, subtle ST depression high lateral and lateral leads, PRWP, without injury pattern.    Echocardiogram: 08/09/2021: 1. Left ventricular ejection fraction, by estimation, is 55 to 60%. The left ventricle has normal function. Left ventricular endocardial border not optimally defined to evaluate regional wall motion. Left ventricular diastolic parameters are indeterminate. 2D images were obtain on 08/09/2021 LVEF 45-50% w/ RWMA  along the anterolateral and inferolateral walls. Definity images were  obtained 08/10/2021 which notes improvement in LVEF 55-60% without  regional wall motion abnormalities.   2. Right ventricular systolic function is normal. The right ventricular  size is normal. Mildly increased right ventricular wall thickness.   3. A small pericardial effusion is present. There is no evidence of  cardiac tamponade.   4. The mitral valve is normal in structure. Trivial mitral valve  regurgitation. No evidence of mitral stenosis.   5. The aortic valve is tricuspid. Aortic valve regurgitation is not  visualized. No aortic stenosis is present.   6. The inferior vena cava is normal in size with <50% respiratory  variability, suggesting right atrial pressure of 8 mmHg.    Stress Testing:  None   Heart Catheterization: None  Scheduled Meds:  [START ON 08/11/2021] aspirin EC  81 mg Oral Daily   carvedilol  12.5 mg Oral BID   doxycycline  100 mg Oral BID   ezetimibe  10 mg Oral Daily   fenofibrate  160 mg Oral Daily   fluticasone  1 spray Each Nare Daily   icosapent Ethyl  1 g Oral BID   insulin aspart  0-15 Units Subcutaneous TID WC   insulin aspart  0-5 Units Subcutaneous QHS   latanoprost  1 drop Left Eye QHS   metoCLOPramide  2.5 mg Oral QHS   montelukast  10 mg Oral QHS   pantoprazole  40 mg Oral Daily   simvastatin  20 mg Oral QHS   sodium chloride  1 application Both Eyes QHS    Continuous Infusions:  heparin 2,000 Units/hr (08/10/21 1320)    PRN  Meds: acetaminophen, bisacodyl, meclizine, ondansetron (ZOFRAN) IV, phenol, polyvinyl alcohol   IMPRESSION & RECOMMENDATIONS: Kenye Roye is a 70 y.o. 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  Impression:  NSTEMI: Epigastric discomfort - not entirely cardiac; but hx limited to due poor functional capacity at baseline.  Dynamic EKG changes, w/ uptrend of hs troponin.  Continue IV Heparin for ACS protocol.  Continue ASA 81 mg po qday.  Continue Zetia 10mg  po qday.  Add Simvastatin 20mg  po qhs.  Continue home dose of beta - blockers.  Echo - personally reviewed. LVEF and RWMA have improved since yday night once IV Heparin was initiated.  A1c 7.9 LDL 136mg /dL; spoke to wife and patient they are will to try Simvastatin.  The procedure of left heart catheterization with possible intervention was explained to the patient and wife in detail. The indication, alternatives, risks and benefits were reviewed.  Complications include but not limited to bleeding, infection, vascular injury, stroke, myocardial infection, arrhythmia, kidney injury, radiation-related injury in the case of prolonged fluoroscopy use, emergency cardiac surgery, and death. The patient understands the risks of serious complication is 1-2 in [id number] with diagnostic cardiac cath and 1-2% or less with angioplasty/stenting. The patient voices understanding and provides verbal feedback and wishes to proceed with coronary angiography with possible PCI. Recommend light breakfast 08/11/2021 and NPO after 10am except meds.   Hyperlipidemia: Currently not on statin therapy at home; due statin  intolerance.  Wife bring in a list that notes he has tried Crestor, Lipitor, and Pravastatin.  Given the NSTEMI both are agreeable to try Simvastatin as an alternative and consider PCSK9 as outpatient.  Fasting lipid profile - reviewed TAG and LDL not well controlled.  Continue Zetia 10 mg p.o. daily.   Hypertriglyceridemia: Continue fenofibrate. Add Vascepa    Insulin-dependent diabetes mellitus type 2 with complications of neuropathy & hyperglycemia. Currently managed per primary team.   Intractable abdominal pain, nausea, vomiting:  Working diagnosis partial bowel obstruction versus ileus - improved.  NG tube has been d/c.  Surgery has signed off.  Started on Clear Liquid Diet.    Hx of PE: on Eliquis at home; will transition to IV heparin. Last dose night of 08/08/2021.    Will defer management of his other co-morbid conditions and active problem list to primary team for now.   Plan of care discussed w/ nursing staff, wife, pharmacist, and primary team after morning rounds.   Patient's questions and concerns were addressed to his satisfaction. He voices understanding of the instructions provided during this encounter.   This note was created using a voice recognition software as a result there may be grammatical errors inadvertently enclosed that do not reflect the nature of this encounter. Every attempt is made to correct such errors.  Total time spent: 38 minutes.   Mechele Claude [facility]  Pager: [phone] Office: [phone] 08/10/2021, 1:54 PM

## 2021-08-10 NOTE — Progress Notes (Signed)
Date and time results received: 08/09/21 1555 (use smartphrase ".now" to insert current time)  Test: Troponin Critical Value: 234  Name of Provider Notified: Ronaldo Miyamoto, MD  Orders Received? Or Actions Taken?: No new orders at this time

## 2021-08-10 NOTE — Progress Notes (Signed)
ANTICOAGULATION CONSULT NOTE   Pharmacy Consult for heparin Indication: chest pain/ACS, history of DVT/PE  Allergies  Allergen Reactions   Metoclopramide Itching    Loss of Balance; Urinary Incontinence   Nsaids     Other reaction(s): Other (See Comments) Renal Insufficiency Kidney issues    Amoxicillin-Pot Clavulanate Diarrhea and Nausea And Vomiting    Other reaction(s): Other (See Comments) Abdomen Pain Abdomen pain    Statins     Other reaction(s): Other (See Comments) Myalgias and Myositis myalgias    Allopurinol Rash   Empagliflozin     Other reaction(s): Other (See Comments) Fainting, weakness, lightheaded, falling   Tiotropium     Other reaction(s): Other (See Comments) Urinary retention    Patient Measurements: Height: 6\' 1"  Height: 6\' 1"  (185.4 cm) Weight: 122.3 kg (269 lb 10 oz) IBW/kg (Calculated) : 79.9 Heparin Dosing Weight: 107.5 kg  Vital Signs: Temp: 98.7 F (37.1 C) (11/27 1900) Temp Source: Oral (11/27 1900) BP: 108/58 (11/27 1900) Pulse Rate: 64 (11/27 1900)  Labs: Recent Labs    08/09/21 0801 08/09/21 0946 08/09/21 1619 08/10/21 0204 08/10/21 1038 08/10/21 1330 08/10/21 2059  HGB 13.4  --   --  13.4  --   --   --   HCT 41.0  --   --  41.9  --   --   --   PLT 271  --   --  292  --   --   --   APTT  --    < > 29 30 33  --  47*  LABPROT  --   --  14.7  --   --   --   --   INR  --   --  1.2  --   --   --   --   HEPARINUNFRC  --   --  >1.10* >1.10*  --   --   --   CREATININE 1.28*  --   --   --   --   --   --   TROPONINIHS 46*   < > 280*  --  723* 731*  --    < > = values in this interval not displayed.     Estimated Creatinine Clearance: 71.5 mL/min (A) (by C-G formula based on SCr of 1.28 mg/dL (H)).   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 High sensitivity troponin up to 700  Pharmacy consulted for IV heparin dosing. Last dose of Apixaban 5mg  PO BID reported as 08/08/21 at 2230. CBC WNL. Note that recent Apixaban use can falsely elevate heparin level.   08/10/21 Heparin level > 1.1 (level falsely elevated due to recent apixaban) aPTT = 47sec (subtherapeutic) with heparin gtt @ 2000 units/hr RN confirmed no interruption in heparin therapy and no line issues.  No bleeding complications noted. CBC wnl  Goal of Therapy:  Heparin level 0.3-0.7 units/ml aPTT 66-102 seconds Monitor platelets by anticoagulation protocol: Yes   Plan:  Increase heparin gtt to 2200 units/hr Will adjust/titrate heparin using aPTT until Apixaban has cleared from system Daily CBC, heparin level, aPTT Monitor closely for s/sx of bleeding   Pharm.D. CPP, BCPS Clinical Pharmacist [phone] 08/10/2021 10:00 PM

## 2021-08-11 DIAGNOSIS — E7800 Pure hypercholesterolemia, unspecified: Secondary | ICD-10-CM

## 2021-08-11 DIAGNOSIS — N179 Acute kidney failure, unspecified: Secondary | ICD-10-CM

## 2021-08-11 LAB — APTT
aPTT: 55 seconds — ABNORMAL HIGH (ref 24–36)
aPTT: >200 seconds (ref 24–36)
aPTT: >200 seconds (ref 24–36)

## 2021-08-11 LAB — BASIC METABOLIC PANEL
Anion gap: 9 (ref 5–15)
Anion gap: 8 (ref 5–15)
BUN: 34 mg/dL — ABNORMAL HIGH (ref 8–23)
BUN: 32 mg/dL — ABNORMAL HIGH (ref 8–23)
CO2: 25 mmol/L (ref 22–32)
CO2: 26 mmol/L (ref 22–32)
Calcium: 9 mg/dL (ref 8.9–10.3)
Calcium: 8.9 mg/dL (ref 8.9–10.3)
Chloride: 101 mmol/L (ref 98–111)
Chloride: 101 mmol/L (ref 98–111)
Creatinine, Ser: 1.58 mg/dL — ABNORMAL HIGH (ref 0.61–1.24)
Creatinine, Ser: 1.63 mg/dL — ABNORMAL HIGH (ref 0.61–1.24)
GFR, Estimated: 46 mL/min — ABNORMAL LOW (ref >60)
GFR, Estimated: 44 mL/min — ABNORMAL LOW (ref >60)
Glucose, Bld: 137 mg/dL — ABNORMAL HIGH (ref 70–99)
Glucose, Bld: 185 mg/dL — ABNORMAL HIGH (ref 70–99)
Potassium: 4 mmol/L (ref 3.5–5.1)
Potassium: 4.5 mmol/L (ref 3.5–5.1)
Sodium: 135 mmol/L (ref 135–145)
Sodium: 135 mmol/L (ref 135–145)

## 2021-08-11 LAB — HEMOGLOBIN A1C
Hgb A1c MFr Bld: 8 % — ABNORMAL HIGH (ref 4.8–5.6)
Mean Plasma Glucose: 183 mg/dL

## 2021-08-11 LAB — GLUCOSE, CAPILLARY
Glucose-Capillary: 181 mg/dL — ABNORMAL HIGH (ref 70–99)
Glucose-Capillary: 179 mg/dL — ABNORMAL HIGH (ref 70–99)
Glucose-Capillary: 190 mg/dL — ABNORMAL HIGH (ref 70–99)
Glucose-Capillary: 169 mg/dL — ABNORMAL HIGH (ref 70–99)

## 2021-08-11 LAB — HEPARIN LEVEL (UNFRACTIONATED): Heparin Unfractionated: >1.1 IU/mL — ABNORMAL HIGH (ref 0.30–0.70)

## 2021-08-11 MED ORDER — HEPARIN (PORCINE) 25000 UT/250ML-% IV SOLN
2250.0000 [IU]/h | INTRAVENOUS | Status: DC
  Administered 2021-08-11 – 2021-08-12 (×2): 2250 [IU]/h via INTRAVENOUS
  Filled 2021-08-11 (×2): qty 250

## 2021-08-11 NOTE — Progress Notes (Signed)
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  He also reported substernal chest tightness associated with left arm numbness that resolved after 2 to 3 hours. Patient had similar presentation few years back and found to have a small bowel obstruction.CT abdomen and pelvis shows findings concerning with ileus versus partial small bowel obstruction.  NG tube was placed.  General surgery consulted.  Patient able to pass flatus.  Ileus resolving, NG tube removed on 08/10/2021.  Seen by cardiology, elevated troponin, meeting criteria for NSTEMI.  Started on heparin drip.  Transferred to [name] on 08/10/2021 for cardiac cath.    Assessment & Plan:   Principal Problem:   Ileus ([place]) Active Problems:   Non-ST elevation (NSTEMI) myocardial infarction ([place])   Type 2 diabetes mellitus with hyperlipidemia ([facility])   Hypertriglyceridemia   Intractable abdominal pain   Nausea and vomiting   History of pulmonary embolism   Statin intolerance   NSTEMI (non-ST elevated myocardial infarction) ([facility])   Elevated troponin   AKI (acute kidney injury) ([place])   Pure hypercholesterolemia  Ileus/partial small bowel obstruction: Clinically improving, no nausea.  Tolerating soft diet.  Passing flatus.  X-ray yesterday shows some portion of small bowel obstruction is still remaining.  NG tube removed yesterday.  Monitor.  NSTEMI: Echo shows 55 to 60% ejection fraction and no wall motion abnormality.  Small pericardial effusion.  No chest pain, remains on heparin drip.  Scheduled for cardiac cath tentatively but creatinine rising.  May need to cancel cardiac cath.  Defer to cardiology.  Continue aspirin,  Coreg.  Hyperlipidemia: Continue Zetia  GERD: Continue Protonix  Chronic HFpEF: Euvolemic, continue Coreg.  History of DVT and PE: Eliquis on hold while he is on heparin.  DM2: Continue regular insulin sliding scale.  Acute kidney injury: Patient has been having rising creatinine since last 2 days.  Currently creatinine 1.63.  Continue IV fluids that were started by cardiology this morning.  DVT prophylaxis: IV heparin Code Status: (Full code. Family Communication: None at bedside. Disposition Plan: Discharge home once cleared by cardiology.  Status is: Inpatient  Remains inpatient appropriate because: Needs cardiac cath.  Consultants:  General surgery Cardiology  Procedures:   Antimicrobials:  Anti-infectives (From admission, onward)    Start     Dose/Rate Route Frequency Ordered Stop   08/09/21 2200  doxycycline (VIBRA-TABS) tablet 100 mg        100 mg Oral 2 times daily 08/09/21 1508 08/10/21 2110       Subjective:  Patient seen and examined.  He has no complaints.  No chest pain, nausea, abdominal pain, shortness of breath or any other complaint. Objective: Vitals:   08/11/21 0004 08/11/21 0005 08/11/21 0200 08/11/21 0300  BP: 113/71  (!) 112/56   Pulse: 62 62 64   Resp: 17  18   Temp: 97.9 F (36.6 C)  98 F (36.7 C) 98 F (36.7 C)  TempSrc: Oral  Oral Oral  SpO2: 95%     Weight:    124.5 kg  Height:        Intake/Output Summary (Last 24 hours) at 08/11/2021 1325 Last data filed at 08/11/2021 0408 Gross per 24 hour  Intake 96.29 ml  Output  225 ml  Net -128.71 ml    Filed Weights   08/09/21 1656 08/10/21 1739 08/11/21 0300  Weight: 125.1 kg 122.3 kg 124.5 kg    Examination:  General exam: Appears calm and comfortable  Respiratory system: Clear to auscultation. Respiratory effort normal. Cardiovascular system: S1 & S2 heard, RRR. No JVD, murmurs, rubs, gallops or clicks.  +1 pitting edema bilateral lower extremity. Gastrointestinal  system: Abdomen is nondistended, soft, slightly tender at right lower quadrant. No organomegaly or masses felt. Normal bowel sounds heard. Central nervous system: Alert and oriented. No focal neurological deficits. Extremities: Symmetric 5 x 5 power. Skin: No rashes, lesions or ulcers.  Psychiatry: Judgement and insight appear normal. Mood & affect appropriate.    Data Reviewed: I have personally reviewed following labs and imaging studies  CBC: Recent Labs  Lab 08/09/21 0801 08/10/21 0204  WBC 7.1 10.7*  NEUTROABS 4.3  --   HGB 13.4 13.4  HCT 41.0 41.9  MCV 94.5 94.6  PLT 271 [id number]    Basic Metabolic Panel: Recent Labs  Lab 08/09/21 0801 08/11/21 0327  NA 136 135  K 4.3 4.0  CL 100 101  CO2 27 25  GLUCOSE 213* 137*  BUN 33* 34*  CREATININE 1.28* 1.58*  CALCIUM 9.3 9.0    GFR: Estimated Creatinine Clearance: 58.4 mL/min (A) (by C-G formula based on SCr of 1.58 mg/dL (H)). Liver Function Tests: Recent Labs  Lab 08/09/21 0801  AST 23  ALT 21  ALKPHOS 42  BILITOT 0.4  PROT 7.1  ALBUMIN 3.3*    Recent Labs  Lab 08/09/21 0801  LIPASE 29    No results for input(s): AMMONIA in the last 168 hours. Coagulation Profile: Recent Labs  Lab 08/09/21 1619  INR 1.2    Cardiac Enzymes: No results for input(s): CKTOTAL, CKMB, CKMBINDEX, TROPONINI in the last 168 hours. BNP (last 3 results) No results for input(s): PROBNP in the last 8760 hours. HbA1C: Recent Labs    08/09/21 1619  HGBA1C 8.0*   CBG: Recent Labs  Lab 08/10/21 1121 08/10/21 1800 08/10/21 2110 08/11/21 0606 08/11/21 1127  GLUCAP 222* 176* 170* 181* 179*    Lipid Profile: Recent Labs    08/10/21 0204  CHOL 223*  HDL 24*  LDLCALC 136*  TRIG 313*  CHOLHDL 9.3    Thyroid Function Tests: No results for input(s): TSH, T4TOTAL, FREET4, T3FREE, THYROIDAB in the last 72 hours. Anemia Panel: No results for input(s): VITAMINB12, FOLATE, FERRITIN, TIBC, IRON, RETICCTPCT in the last 72  hours. Sepsis Labs: No results for input(s): PROCALCITON, LATICACIDVEN in the last 168 hours.  Recent Results (from the past 240 hour(s))  Resp Panel by RT-PCR (Flu A&B, Covid) Nasopharyngeal Swab     Status: None   Collection Time: 08/09/21  8:14 AM   Specimen: Nasopharyngeal Swab; Nasopharyngeal(NP) swabs in vial transport medium  Result Value Ref Range Status   SARS Coronavirus 2 by RT PCR NEGATIVE NEGATIVE Final    Comment: (NOTE) SARS-CoV-2 target nucleic acids are NOT DETECTED.  The SARS-CoV-2 RNA is generally detectable in upper respiratory specimens during the acute phase of infection. The lowest concentration of SARS-CoV-2 viral copies this assay can detect is 138 copies/mL. A negative result does not preclude SARS-Cov-2 infection and should not be used as the sole basis for treatment or other patient management decisions. A negative result may occur with  improper specimen collection/handling, submission of specimen other than nasopharyngeal swab, presence of viral mutation(s) within the areas targeted by  this assay, and inadequate number of viral copies(<138 copies/mL). A negative result must be combined with clinical observations, patient history, and epidemiological information. The expected result is Negative.  Fact Sheet for Patients:  BloggerCourse.com  Fact Sheet for Healthcare Providers:  SeriousBroker.it  This test is no t yet approved or cleared by the [place] FDA and  has been authorized for detection and/or diagnosis of SARS-CoV-2 by FDA under an Emergency Use Authorization (EUA). This EUA will remain  in effect (meaning this test can be used) for the duration of the COVID-19 declaration under Section 564(b)(1) of the Act, 21 U.S.C.section 360bbb-3(b)(1), unless the authorization is terminated  or revoked sooner.       Influenza A by PCR NEGATIVE NEGATIVE Final   Influenza B by PCR NEGATIVE  NEGATIVE Final    Comment: (NOTE) The Xpert Xpress SARS-CoV-2/FLU/RSV plus assay is intended as an aid in the diagnosis of influenza from Nasopharyngeal swab specimens and should not be used as a sole basis for treatment. Nasal washings and aspirates are unacceptable for Xpert Xpress SARS-CoV-2/FLU/RSV testing.  Fact Sheet for Patients: BloggerCourse.com  Fact Sheet for Healthcare Providers: SeriousBroker.it  This test is not yet approved or cleared by the [place] FDA and has been authorized for detection and/or diagnosis of SARS-CoV-2 by FDA under an Emergency Use Authorization (EUA). This EUA will remain in effect (meaning this test can be used) for the duration of the COVID-19 declaration under Section 564(b)(1) of the Act, 21 U.S.C. section 360bbb-3(b)(1), unless the authorization is terminated or revoked.  Performed at [facility], [address] [place], [place] [address]      Radiology Studies: DG Abd Portable 2V  Result Date: 08/10/2021 CLINICAL DATA:  Small bowel obstruction. EXAM: PORTABLE ABDOMEN - 2 VIEW COMPARISON:  CT of the abdomen from yesterday FINDINGS: The enteric tube tip is at the proximal stomach with side port at the lower esophagus. Continued gas dilated small bowel similar to scanogram from CT. Stool is seen throughout much of the colon. Small contrast filled bladder. IMPRESSION: 1. Ongoing small bowel obstruction without detected change from CT yesterday. 2. Enteric tube with tip at the stomach and side port at the lower esophagus. Electronically Signed   By: Tiburcio Pea M.D.   On: 08/10/2021 08:40   ECHOCARDIOGRAM COMPLETE  Result Date: 08/10/2021    ECHOCARDIOGRAM REPORT   Patient Name:   WARDEN BUFFA Date of Exam: 08/09/2021 Medical Rec #:  [id number]          Height:       73.0 in Accession #:    [id number]         Weight:       263.0 lb Date of Birth:  08-30-1951           BSA:          2.417 m Patient Age:    70 years           BP:           131/60 mmHg Patient Gender: M                  HR:           73 bpm. Exam Location:  Inpatient Procedure: 2D Echo, Cardiac Doppler, Color Doppler and Intracardiac            Opacification Agent Indications:     Elevated troponin  History:  Patient has prior history of Echocardiogram examinations, most                  recent 12/13/2020. CHF, Signs/Symptoms:Chest Pain; Risk                  Factors:Hypertension, Diabetes and Dyslipidemia. Long haul                  Covid.  Sonographer:     Glo Herring Sonographer#2:   Merrie Roof RDCS Referring Phys:  [id number] Jonnie Finner Diagnosing Phys: Rex Kras DO IMPRESSIONS  1. Left ventricular ejection fraction, by estimation, is 55 to 60%. The left ventricle has normal function. Left ventricular endocardial border not optimally defined to evaluate regional wall motion. Left ventricular diastolic parameters are indeterminate 2D images were obtain on 08/09/2021 LVEF 45-50% w/ RWMA along the anterolateral and inferolateral walls. Definity images were obtained 08/10/2021 which notes improvement in LVEF 55-60% without regional wall motion abnormalities.  2. Right ventricular systolic function is normal. The right ventricular size is normal. Mildly increased right ventricular wall thickness.  3. A small pericardial effusion is present. There is no evidence of cardiac tamponade.  4. The mitral valve is normal in structure. Trivial mitral valve regurgitation. No evidence of mitral stenosis.  5. The aortic valve is tricuspid. Aortic valve regurgitation is not visualized. No aortic stenosis is present.  6. The inferior vena cava is normal in size with <50% respiratory variability, suggesting right atrial pressure of 8 mmHg. FINDINGS  Left Ventricle: 2D images were obtain on 08/09/2021 LVEF 45-50% w/ RWMA along the anterolateral and inferolateral walls. Definity images were obtained 08/10/2021 which  notes LVEF 55-60% without regional wall motion abnormalities. Left ventricular ejection fraction, by estimation, is 55 to 60%. The left ventricle has normal function. Left ventricular endocardial border not optimally defined to evaluate regional wall motion. Definity contrast agent was given IV to delineate the left ventricular endocardial borders. The left ventricular internal cavity size was normal in size. There is no left ventricular hypertrophy. Left ventricular diastolic parameters are indeterminate. Right Ventricle: The right ventricular size is normal. Mildly increased right ventricular wall thickness. Right ventricular systolic function is normal. Left Atrium: Left atrial size was normal in size. Right Atrium: Right atrial size was normal in size. Pericardium: A small pericardial effusion is present. The pericardial effusion appears to contain mixed echogenic material. There is no evidence of cardiac tamponade. Mitral Valve: The mitral valve is normal in structure. Trivial mitral valve regurgitation. No evidence of mitral valve stenosis. Tricuspid Valve: The tricuspid valve is grossly normal. Tricuspid valve regurgitation is trivial. No evidence of tricuspid stenosis. Aortic Valve: The aortic valve is tricuspid. Aortic valve regurgitation is not visualized. No aortic stenosis is present. Aortic valve mean gradient measures 3.0 mmHg. Aortic valve peak gradient measures 4.8 mmHg. Aortic valve area, by VTI measures 2.47 cm. Pulmonic Valve: The pulmonic valve was not well visualized. Pulmonic valve regurgitation is trivial. No evidence of pulmonic stenosis. Aorta: The aortic root and ascending aorta are structurally normal, with no evidence of dilitation. Venous: The inferior vena cava is normal in size with less than 50% respiratory variability, suggesting right atrial pressure of 8 mmHg. IAS/Shunts: The interatrial septum was not well visualized.  LEFT VENTRICLE PLAX 2D LVIDd:         3.50 cm     Diastology  LVIDs:         2.30 cm     LV e' medial:  3.81 cm/s LV PW:         1.10 cm     LV E/e' medial:  16.4 LV IVS:        1.10 cm     LV e' lateral:   5.00 cm/s LVOT diam:     2.00 cm     LV E/e' lateral: 12.5 LV SV:         55 LV SV Index:   23 LVOT Area:     3.14 cm  LV Volumes (MOD) LV vol d, MOD A2C: 46.2 ml LV vol d, MOD A4C: 73.1 ml LV vol s, MOD A2C: 14.1 ml LV vol s, MOD A4C: 23.7 ml LV SV MOD A2C:     32.1 ml LV SV MOD A4C:     73.1 ml LV SV MOD BP:      45.2 ml RIGHT VENTRICLE             IVC RV S prime:     11.20 cm/s  IVC diam: 1.30 cm LEFT ATRIUM           Index LA diam:      4.80 cm 1.99 cm/m LA Vol (A2C): 43.1 ml 17.83 ml/m  AORTIC VALVE                    PULMONIC VALVE AV Area (Vmax):    2.66 cm     PV Vmax:       1.26 m/s AV Area (Vmean):   2.45 cm     PV Peak grad:  6.4 mmHg AV Area (VTI):     2.47 cm AV Vmax:           110.00 cm/s AV Vmean:          75.800 cm/s AV VTI:            0.224 m AV Peak Grad:      4.8 mmHg AV Mean Grad:      3.0 mmHg LVOT Vmax:         93.30 cm/s LVOT Vmean:        59.100 cm/s LVOT VTI:          0.176 m LVOT/AV VTI ratio: 0.79  AORTA Ao Root diam: 3.30 cm Ao Asc diam:  2.80 cm MITRAL VALVE MV Area (PHT): 3.07 cm    SHUNTS MV Decel Time: 247 msec    Systemic VTI:  0.18 m MV E velocity: 62.60 cm/s  Systemic Diam: 2.00 cm MV A velocity: 95.50 cm/s MV E/A ratio:  0.66 Sunit Tolia DO Electronically signed by Rex Kras DO Signature Date/Time: 08/10/2021/1:43:56 PM    Final     Scheduled Meds:  aspirin EC  81 mg Oral Daily   carvedilol  12.5 mg Oral BID   ezetimibe  10 mg Oral Daily   fenofibrate  160 mg Oral Daily   fluticasone  1 spray Each Nare Daily   icosapent Ethyl  1 g Oral BID   insulin aspart  0-15 Units Subcutaneous TID WC   insulin aspart  0-5 Units Subcutaneous QHS   latanoprost  1 drop Left Eye QHS   metoCLOPramide  2.5 mg Oral QHS   montelukast  10 mg Oral QHS   pantoprazole  40 mg Oral Daily   simvastatin  20 mg Oral QHS   sodium chloride  1  application Both Eyes QHS   sodium chloride flush  3 mL Intravenous Q12H   Continuous Infusions:  sodium  chloride     sodium chloride 100 mL/hr at 08/11/21 0852   heparin 2,350 Units/hr (08/11/21 0511)     LOS: 1 day    Time spent: 34 mins    Darliss Cheney, MD [facility]   If 7PM-7AM, please contact night-coverage

## 2021-08-11 NOTE — H&P (View-Only) (Signed)
Progress Note  Patient Name: Mozell Hardacre Date of Encounter: 08/11/2021  Attending physician: Hughie Closs, MD Primary care provider: Felton Clinton, MD  Subjective: [name] is a 70 y.o. male who was seen and examined at bedside  No chest pain or shortness of breath Tolerated clear liquid diet yday.  Case discussed and reviewed with his nurse.  Objective: Vital Signs in the last 24 hours: Temp:  [97.9 F (36.6 C)-98.9 F (37.2 C)] 98 F (36.7 C) (11/28 0300) Pulse Rate:  [62-65] 64 (11/28 0200) Resp:  [17-20] 18 (11/28 0200) BP: (108-120)/(56-74) 112/56 (11/28 0200) SpO2:  [95 %-99 %] 95 % (11/28 0004) Weight:  [122.3 kg-124.5 kg] 124.5 kg (11/28 0300)  Intake/Output:  Intake/Output Summary (Last 24 hours) at 08/11/2021 0904 Last data filed at 08/11/2021 0408 Gross per 24 hour  Intake 96.29 ml  Output 225 ml  Net -128.71 ml    Net IO Since Admission: 1,087.93 mL [08/11/21 0904]  Weights:  Filed Weights   08/09/21 1656 08/10/21 1739 08/11/21 0300  Weight: 125.1 kg 122.3 kg 124.5 kg    Telemetry: Personally reviewed. NSR.   Physical examination: PHYSICAL EXAM: Vitals with BMI 08/11/2021 08/11/2021 08/11/2021  Height - - -  Weight 274 lbs 8 oz - -  BMI 36.22 - -  Systolic - 112 -  Diastolic - 56 -  Pulse - 64 62    CONSTITUTIONAL: Appears older than stated age, hemodynamically stable, well-developed and well-nourished. No acute distress.  SKIN: Skin is warm and dry. No rash noted. No cyanosis. No pallor. No jaundice HEAD: Normocephalic and atraumatic.  EYES: No scleral icterus MOUTH/THROAT: Moist oral membranes.  NECK: No JVD present. No thyromegaly noted. No carotid bruits  LYMPHATIC: No visible cervical adenopathy.  CHEST Normal respiratory effort. No intercostal retractions  LUNGS: Clear to auscultation bilaterally.  No stridor. No wheezes. No rales.  CARDIOVASCULAR: Regular rate and rhythm, positive S1-S2, no murmurs rubs or gallops  appreciated ABDOMINAL: Obese, soft, nontender, nondistended, bowel sounds in all 4 quadrants, no ascites.   EXTREMITIES: Trace bilateral pitting edema, warm to touch.  HEMATOLOGIC: No significant bruising NEUROLOGIC: Oriented to person, place, and time. Nonfocal. Normal muscle tone.  PSYCHIATRIC: Normal mood and affect. Normal behavior. Cooperative  Lab Results: Hematology Recent Labs  Lab 08/09/21 0801 08/10/21 0204  WBC 7.1 10.7*  RBC 4.34 4.43  HGB 13.4 13.4  HCT 41.0 41.9  MCV 94.5 94.6  MCH 30.9 30.2  MCHC 32.7 32.0  RDW 15.4 15.8*  PLT 271 292    Chemistry Recent Labs  Lab 08/09/21 0801 08/11/21 0327  NA 136 135  K 4.3 4.0  CL 100 101  CO2 27 25  GLUCOSE 213* 137*  BUN 33* 34*  CREATININE 1.28* 1.58*  CALCIUM 9.3 9.0  PROT 7.1  --   ALBUMIN 3.3*  --   AST 23  --   ALT 21  --   ALKPHOS 42  --   BILITOT 0.4  --   GFRNONAA 59* 46*  ANIONGAP 9 9     Cardiac Enzymes: Cardiac Panel (last 3 results) Recent Labs    08/09/21 1619 08/10/21 1038 08/10/21 1330  TROPONINIHS 280* 723* 731*    BNP (last 3 results) Recent Labs    08/09/21 1435  BNP 138.4*    ProBNP (last 3 results) No results for input(s): PROBNP in the last 8760 hours.   DDimer No results for input(s): DDIMER in the last 168 hours.   Hemoglobin A1c:  Lab  Results  Component Value Date   HGBA1C 8.0 (H) 08/09/2021   MPG 183 08/09/2021    TSH No results for input(s): TSH in the last 8760 hours.  Lipid Panel     Component Value Date/Time   CHOL 223 (H) 08/10/2021 0204   TRIG 313 (H) 08/10/2021 0204   HDL 24 (L) 08/10/2021 0204   CHOLHDL 9.3 08/10/2021 0204   VLDL 63 (H) 08/10/2021 0204   LDLCALC 136 (H) 08/10/2021 0204    Imaging: DG Chest Portable 1 View  Result Date: 08/09/2021 CLINICAL DATA:  Chest X-Ray was ordered for NG tube placement EXAM: PORTABLE CHEST - 1 VIEW COMPARISON:  Earlier film of the same day FINDINGS: Gastric tube is been advanced into the  decompressed stomach. Lungs are clear. Leftward tracheal deviation at the thoracic inlet consistent with goiter described on previous studies from 02/29/2020 and previous. Heart size upper limits normal. Aortic Atherosclerosis (ICD10-170.0). Blunting of the left lateral costophrenic angle.  No pneumothorax. Visualized bones unremarkable. IMPRESSION: Gastric tube to the decompressed stomach. Electronically Signed   By: Lucrezia Europe M.D.   On: 08/09/2021 11:52   DG Abd Portable 2V  Result Date: 08/10/2021 CLINICAL DATA:  Small bowel obstruction. EXAM: PORTABLE ABDOMEN - 2 VIEW COMPARISON:  CT of the abdomen from yesterday FINDINGS: The enteric tube tip is at the proximal stomach with side port at the lower esophagus. Continued gas dilated small bowel similar to scanogram from CT. Stool is seen throughout much of the colon. Small contrast filled bladder. IMPRESSION: 1. Ongoing small bowel obstruction without detected change from CT yesterday. 2. Enteric tube with tip at the stomach and side port at the lower esophagus. Electronically Signed   By: Jorje Guild M.D.   On: 08/10/2021 08:40   ECHOCARDIOGRAM COMPLETE  Result Date: 08/10/2021    ECHOCARDIOGRAM REPORT   Patient Name:   KYROS KREMIN Date of Exam: 08/09/2021 Medical Rec #:  [id number]          Height:       73.0 in Accession #:    [id number]         Weight:       263.0 lb Date of Birth:  [birth year]          BSA:          2.417 m Patient Age:    56 years           BP:           131/60 mmHg Patient Gender: M                  HR:           73 bpm. Exam Location:  Inpatient Procedure: 2D Echo, Cardiac Doppler, Color Doppler and Intracardiac            Opacification Agent Indications:     Elevated troponin  History:         Patient has prior history of Echocardiogram examinations, most                  recent 12/13/2020. CHF, Signs/Symptoms:Chest Pain; Risk                  Factors:Hypertension, Diabetes and Dyslipidemia. Long haul                   Covid.  Sonographer:     Glo Herring Sonographer#2:   Merrie Roof RDCS Referring Phys:  [id number] Jonnie Finner  Diagnosing Phys: Rex Kras DO IMPRESSIONS  1. Left ventricular ejection fraction, by estimation, is 55 to 60%. The left ventricle has normal function. Left ventricular endocardial border not optimally defined to evaluate regional wall motion. Left ventricular diastolic parameters are indeterminate 2D images were obtain on 08/09/2021 LVEF 45-50% w/ RWMA along the anterolateral and inferolateral walls. Definity images were obtained 08/10/2021 which notes improvement in LVEF 55-60% without regional wall motion abnormalities.  2. Right ventricular systolic function is normal. The right ventricular size is normal. Mildly increased right ventricular wall thickness.  3. A small pericardial effusion is present. There is no evidence of cardiac tamponade.  4. The mitral valve is normal in structure. Trivial mitral valve regurgitation. No evidence of mitral stenosis.  5. The aortic valve is tricuspid. Aortic valve regurgitation is not visualized. No aortic stenosis is present.  6. The inferior vena cava is normal in size with <50% respiratory variability, suggesting right atrial pressure of 8 mmHg. FINDINGS  Left Ventricle: 2D images were obtain on 08/09/2021 LVEF 45-50% w/ RWMA along the anterolateral and inferolateral walls. Definity images were obtained 08/10/2021 which notes LVEF 55-60% without regional wall motion abnormalities. Left ventricular ejection fraction, by estimation, is 55 to 60%. The left ventricle has normal function. Left ventricular endocardial border not optimally defined to evaluate regional wall motion. Definity contrast agent was given IV to delineate the left ventricular endocardial borders. The left ventricular internal cavity size was normal in size. There is no left ventricular hypertrophy. Left ventricular diastolic parameters are indeterminate. Right Ventricle: The right ventricular  size is normal. Mildly increased right ventricular wall thickness. Right ventricular systolic function is normal. Left Atrium: Left atrial size was normal in size. Right Atrium: Right atrial size was normal in size. Pericardium: A small pericardial effusion is present. The pericardial effusion appears to contain mixed echogenic material. There is no evidence of cardiac tamponade. Mitral Valve: The mitral valve is normal in structure. Trivial mitral valve regurgitation. No evidence of mitral valve stenosis. Tricuspid Valve: The tricuspid valve is grossly normal. Tricuspid valve regurgitation is trivial. No evidence of tricuspid stenosis. Aortic Valve: The aortic valve is tricuspid. Aortic valve regurgitation is not visualized. No aortic stenosis is present. Aortic valve mean gradient measures 3.0 mmHg. Aortic valve peak gradient measures 4.8 mmHg. Aortic valve area, by VTI measures 2.47 cm. Pulmonic Valve: The pulmonic valve was not well visualized. Pulmonic valve regurgitation is trivial. No evidence of pulmonic stenosis. Aorta: The aortic root and ascending aorta are structurally normal, with no evidence of dilitation. Venous: The inferior vena cava is normal in size with less than 50% respiratory variability, suggesting right atrial pressure of 8 mmHg. IAS/Shunts: The interatrial septum was not well visualized.  LEFT VENTRICLE PLAX 2D LVIDd:         3.50 cm     Diastology LVIDs:         2.30 cm     LV e' medial:    3.81 cm/s LV PW:         1.10 cm     LV E/e' medial:  16.4 LV IVS:        1.10 cm     LV e' lateral:   5.00 cm/s LVOT diam:     2.00 cm     LV E/e' lateral: 12.5 LV SV:         55 LV SV Index:   23 LVOT Area:     3.14 cm  LV Volumes (MOD) LV vol  d, MOD A2C: 46.2 ml LV vol d, MOD A4C: 73.1 ml LV vol s, MOD A2C: 14.1 ml LV vol s, MOD A4C: 23.7 ml LV SV MOD A2C:     32.1 ml LV SV MOD A4C:     73.1 ml LV SV MOD BP:      45.2 ml RIGHT VENTRICLE             IVC RV S prime:     11.20 cm/s  IVC diam: 1.30 cm  LEFT ATRIUM           Index LA diam:      4.80 cm 1.99 cm/m LA Vol (A2C): 43.1 ml 17.83 ml/m  AORTIC VALVE                    PULMONIC VALVE AV Area (Vmax):    2.66 cm     PV Vmax:       1.26 m/s AV Area (Vmean):   2.45 cm     PV Peak grad:  6.4 mmHg AV Area (VTI):     2.47 cm AV Vmax:           110.00 cm/s AV Vmean:          75.800 cm/s AV VTI:            0.224 m AV Peak Grad:      4.8 mmHg AV Mean Grad:      3.0 mmHg LVOT Vmax:         93.30 cm/s LVOT Vmean:        59.100 cm/s LVOT VTI:          0.176 m LVOT/AV VTI ratio: 0.79  AORTA Ao Root diam: 3.30 cm Ao Asc diam:  2.80 cm MITRAL VALVE MV Area (PHT): 3.07 cm    SHUNTS MV Decel Time: 247 msec    Systemic VTI:  0.18 m MV E velocity: 62.60 cm/s  Systemic Diam: 2.00 cm MV A velocity: 95.50 cm/s MV E/A ratio:  0.66 Alisia Vanengen DO Electronically signed by Rex Kras DO Signature Date/Time: 08/10/2021/1:43:56 PM    Final     CARDIAC DATABASE: EKG: 08/09/2021: NSR, 66 bpm, left axis deviation, poor R wave progression, without underlying injury pattern.   08/09/2021: NSR, 67 bpm, ST depressions in the high lateral leads suggestive of lateral ischemia.  08/10/2021: NSR, 73bpm, subtle ST depression high lateral and lateral leads, PRWP, without injury pattern.    Echocardiogram: 08/09/2021: 1. Left ventricular ejection fraction, by estimation, is 55 to 60%. The left ventricle has normal function. Left ventricular endocardial border not optimally defined to evaluate regional wall motion. Left ventricular diastolic parameters are indeterminate. 2D images were obtain on 08/09/2021 LVEF 45-50% w/ RWMA  along the anterolateral and inferolateral walls. Definity images were  obtained 08/10/2021 which notes improvement in LVEF 55-60% without  regional wall motion abnormalities.   2. Right ventricular systolic function is normal. The right ventricular  size is normal. Mildly increased right ventricular wall thickness.   3. A small pericardial effusion is  present. There is no evidence of  cardiac tamponade.   4. The mitral valve is normal in structure. Trivial mitral valve  regurgitation. No evidence of mitral stenosis.   5. The aortic valve is tricuspid. Aortic valve regurgitation is not  visualized. No aortic stenosis is present.   6. The inferior vena cava is normal in size with <50% respiratory  variability, suggesting right atrial pressure of 8 mmHg.  Stress Testing:  None   Heart Catheterization: None  Scheduled Meds:  aspirin EC  81 mg Oral Daily   carvedilol  12.5 mg Oral BID   ezetimibe  10 mg Oral Daily   fenofibrate  160 mg Oral Daily   fluticasone  1 spray Each Nare Daily   icosapent Ethyl  1 g Oral BID   insulin aspart  0-15 Units Subcutaneous TID WC   insulin aspart  0-5 Units Subcutaneous QHS   latanoprost  1 drop Left Eye QHS   metoCLOPramide  2.5 mg Oral QHS   montelukast  10 mg Oral QHS   pantoprazole  40 mg Oral Daily   simvastatin  20 mg Oral QHS   sodium chloride  1 application Both Eyes QHS   sodium chloride flush  3 mL Intravenous Q12H    Continuous Infusions:  sodium chloride     sodium chloride 100 mL/hr at 08/11/21 0852   heparin 2,350 Units/hr (08/11/21 0511)    PRN Meds: sodium chloride, acetaminophen, bisacodyl, dextromethorphan, meclizine, melatonin, ondansetron (ZOFRAN) IV, phenol, polyvinyl alcohol, sodium chloride flush   IMPRESSION & RECOMMENDATIONS: Vedant Mendias is a 70 y.o. 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  Impression:  NSTEMI: Epigastric discomfort - not entirely cardiac; but hx limited to due poor functional capacity at baseline.   Dynamic EKG changes, w/ uptrend of hs troponin.  Continue IV Heparin for ACS protocol.  Continue aspirin, Zetia, simvastatin, beta-blockers.   Echo - personally reviewed. LVEF and RWMA have improved since yday night once  A1c 7.9 LDL 136mg /dL; spoke to wife and patient they are will to try Simvastatin.  Tentatively scheduled for left heart catheterization with possible intervention later today.  However, morning creatinine level suggestive of acute kidney injury.  We will increase IV fluids and reevaluate labs at noon.  If creatinine still elevated will consider postponing the heart cath till tomorrow.  Acute kidney injury: Increase IV fluids from 75 cc an hour to 100 cc an hour. BMP at noon Further management per primary team   Hyperlipidemia: Currently not on statin therapy at home; due statin intolerance.  Wife brings in a list that notes he has tried Crestor, Lipitor, and Pravastatin.  Given the NSTEMI both are agreeable to try Simvastatin as an alternative and consider PCSK9 as outpatient.  Fasting lipid profile - reviewed TAG and LDL not well controlled.  Continue Zetia 10 mg p.o. daily.   Hypertriglyceridemia: Continue fenofibrate & Vascepa    Insulin-dependent diabetes mellitus type 2 with complications of neuropathy & hyperglycemia. Currently managed per primary team.   Intractable abdominal pain, nausea, vomiting:  Working diagnosis partial bowel obstruction versus ileus - improved.  NG tube has been d/c.  Surgery has signed off.    Hx of PE: on Eliquis at home; will transition to IV heparin. Last dose night of 08/08/2021.    Will defer management of his other co-morbid conditions and active problem list to primary team for now.   Plan of care discussed w/ nursing staff and primary team after morning rounds.   Patient's questions and concerns were addressed to his satisfaction. He voices understanding of the instructions provided during this encounter.   This note  was created using  a voice recognition software as a result there may be grammatical errors inadvertently enclosed that do not reflect the nature of this encounter. Every attempt is made to correct such errors.  Total time spent: 25 minutes.   Mechele Claude [facility]  Pager: [phone] Office: [phone] 08/11/2021, 9:04 AM

## 2021-08-11 NOTE — Progress Notes (Signed)
ANTICOAGULATION CONSULT NOTE   Pharmacy Consult for heparin Indication: chest pain/ACS, history of DVT/PE  Allergies  Allergen Reactions   Metoclopramide Itching    Loss of Balance; Urinary Incontinence   Nsaids     Other reaction(s): Other (See Comments) Renal Insufficiency Kidney issues    Amoxicillin-Pot Clavulanate Diarrhea and Nausea And Vomiting    Other reaction(s): Other (See Comments) Abdomen Pain Abdomen pain    Statins     Other reaction(s): Other (See Comments) Myalgias and Myositis myalgias    Allopurinol Rash   Empagliflozin     Other reaction(s): Other (See Comments) Fainting, weakness, lightheaded, falling   Tiotropium     Other reaction(s): Other (See Comments) Urinary retention    Patient Measurements: Height: 6\' 1"  Height: 6\' 1"  (185.4 cm) Weight: 124.5 kg (274 lb 7.6 oz) IBW/kg (Calculated) : 79.9 Heparin Dosing Weight: 107.5 kg  Vital Signs: BP: 108/55 (11/28 1300) Pulse Rate: 62 (11/28 1400)  Labs: Recent Labs    08/09/21 0801 08/09/21 0946 08/09/21 1619 08/10/21 0204 08/10/21 1038 08/10/21 1330 08/10/21 2059 08/11/21 0327 08/11/21 1220 08/11/21 1410  HGB 13.4  --   --  13.4  --   --   --   --   --   --   HCT 41.0  --   --  41.9  --   --   --   --   --   --   PLT 271  --   --  292  --   --   --   --   --   --   APTT  --    < > 29 30 33  --    < > 55* >200* >200*  LABPROT  --   --  14.7  --   --   --   --   --   --   --   INR  --   --  1.2  --   --   --   --   --   --   --   HEPARINUNFRC  --   --  >1.10* >1.10*  --   --   --  >1.10*  --   --   CREATININE 1.28*  --   --   --   --   --   --  1.58* 1.63*  --   TROPONINIHS 46*   < > 280*  --  723* 731*  --   --   --   --    < > = values in this interval not displayed.     Estimated Creatinine Clearance: 56.6 mL/min (A) (by C-G formula based on SCr of 1.63 mg/dL (H)).     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 Pharmacy consulted  for IV heparin dosing. Last dose of Apixaban 5mg  PO BID reported as 08/08/21 at 2230. CBC WNL. Note that recent Apixaban use can falsely elevate heparin level.   aPTT >200 sec (supratherapeutic) on gtt at 2350 units/hr. No bleeding noted.  Goal of Therapy:  Heparin level 0.3-0.7 units/ml aPTT 66-102 seconds Monitor platelets by anticoagulation protocol: Yes   Plan:  Hold heparin drip for 1 hour and then restart  heparin gtt at 2250 units/hr F/u 8 hr aPTT  61, PharmD, [facility] Clinical Pharmacist Please see AMION for all Pharmacists' Contact Phone Numbers 08/11/2021, 3:42 PM

## 2021-08-11 NOTE — Progress Notes (Signed)
Progress Note  Patient Name: Randy Liu Date of Encounter: 08/11/2021  Attending physician: Hughie Closs, MD Primary care provider: Felton Clinton, MD  Subjective: Randy Liu is a 70 y.o. male who was seen and examined at bedside  No chest pain or shortness of breath Tolerated clear liquid diet yday.  Case discussed and reviewed with his nurse.  Objective: Vital Signs in the last 24 hours: Temp:  [97.9 F (36.6 C)-98.9 F (37.2 C)] 98 F (36.7 C) (11/28 0300) Pulse Rate:  [62-65] 64 (11/28 0200) Resp:  [17-20] 18 (11/28 0200) BP: (108-120)/(56-74) 112/56 (11/28 0200) SpO2:  [95 %-99 %] 95 % (11/28 0004) Weight:  [122.3 kg-124.5 kg] 124.5 kg (11/28 0300)  Intake/Output:  Intake/Output Summary (Last 24 hours) at 08/11/2021 0904 Last data filed at 08/11/2021 0408 Gross per 24 hour  Intake 96.29 ml  Output 225 ml  Net -128.71 ml    Net IO Since Admission: 1,087.93 mL [08/11/21 0904]  Weights:  Filed Weights   08/09/21 1656 08/10/21 1739 08/11/21 0300  Weight: 125.1 kg 122.3 kg 124.5 kg    Telemetry: Personally reviewed. NSR.   Physical examination: PHYSICAL EXAM: Vitals with BMI 08/11/2021 08/11/2021 08/11/2021  Height - - -  Weight 274 lbs 8 oz - -  BMI 36.22 - -  Systolic - 112 -  Diastolic - 56 -  Pulse - 64 62    CONSTITUTIONAL: Appears older than stated age, hemodynamically stable, well-developed and well-nourished. No acute distress.  SKIN: Skin is warm and dry. No rash noted. No cyanosis. No pallor. No jaundice HEAD: Normocephalic and atraumatic.  EYES: No scleral icterus MOUTH/THROAT: Moist oral membranes.  NECK: No JVD present. No thyromegaly noted. No carotid bruits  LYMPHATIC: No visible cervical adenopathy.  CHEST Normal respiratory effort. No intercostal retractions  LUNGS: Clear to auscultation bilaterally.  No stridor. No wheezes. No rales.  CARDIOVASCULAR: Regular rate and rhythm, positive S1-S2, no murmurs rubs or gallops  appreciated ABDOMINAL: Obese, soft, nontender, nondistended, bowel sounds in all 4 quadrants, no ascites.   EXTREMITIES: Trace bilateral pitting edema, warm to touch.  HEMATOLOGIC: No significant bruising NEUROLOGIC: Oriented to person, place, and time. Nonfocal. Normal muscle tone.  PSYCHIATRIC: Normal mood and affect. Normal behavior. Cooperative  Lab Results: Hematology Recent Labs  Lab 08/09/21 0801 08/10/21 0204  WBC 7.1 10.7*  RBC 4.34 4.43  HGB 13.4 13.4  HCT 41.0 41.9  MCV 94.5 94.6  MCH 30.9 30.2  MCHC 32.7 32.0  RDW 15.4 15.8*  PLT 271 292    Chemistry Recent Labs  Lab 08/09/21 0801 08/11/21 0327  NA 136 135  K 4.3 4.0  CL 100 101  CO2 27 25  GLUCOSE 213* 137*  BUN 33* 34*  CREATININE 1.28* 1.58*  CALCIUM 9.3 9.0  PROT 7.1  --   ALBUMIN 3.3*  --   AST 23  --   ALT 21  --   ALKPHOS 42  --   BILITOT 0.4  --   GFRNONAA 59* 46*  ANIONGAP 9 9     Cardiac Enzymes: Cardiac Panel (last 3 results) Recent Labs    08/09/21 1619 08/10/21 1038 08/10/21 1330  TROPONINIHS 280* 723* 731*    BNP (last 3 results) Recent Labs    08/09/21 1435  BNP 138.4*    ProBNP (last 3 results) No results for input(s): PROBNP in the last 8760 hours.   DDimer No results for input(s): DDIMER in the last 168 hours.   Hemoglobin A1c:  Lab  Results  Component Value Date   HGBA1C 8.0 (H) 08/09/2021   MPG 183 08/09/2021    TSH No results for input(s): TSH in the last 8760 hours.  Lipid Panel     Component Value Date/Time   CHOL 223 (H) 08/10/2021 0204   TRIG 313 (H) 08/10/2021 0204   HDL 24 (L) 08/10/2021 0204   CHOLHDL 9.3 08/10/2021 0204   VLDL 63 (H) 08/10/2021 0204   LDLCALC 136 (H) 08/10/2021 0204    Imaging: DG Chest Portable 1 View  Result Date: 08/09/2021 CLINICAL DATA:  Chest X-Ray was ordered for NG tube placement EXAM: PORTABLE CHEST - 1 VIEW COMPARISON:  Earlier film of the same day FINDINGS: Gastric tube is been advanced into the  decompressed stomach. Lungs are clear. Leftward tracheal deviation at the thoracic inlet consistent with goiter described on previous studies from 02/29/2020 and previous. Heart size upper limits normal. Aortic Atherosclerosis (ICD10-170.0). Blunting of the left lateral costophrenic angle.  No pneumothorax. Visualized bones unremarkable. IMPRESSION: Gastric tube to the decompressed stomach. Electronically Signed   By: Lucrezia Europe M.D.   On: 08/09/2021 11:52   DG Abd Portable 2V  Result Date: 08/10/2021 CLINICAL DATA:  Small bowel obstruction. EXAM: PORTABLE ABDOMEN - 2 VIEW COMPARISON:  CT of the abdomen from yesterday FINDINGS: The enteric tube tip is at the proximal stomach with side port at the lower esophagus. Continued gas dilated small bowel similar to scanogram from CT. Stool is seen throughout much of the colon. Small contrast filled bladder. IMPRESSION: 1. Ongoing small bowel obstruction without detected change from CT yesterday. 2. Enteric tube with tip at the stomach and side port at the lower esophagus. Electronically Signed   By: Jorje Guild M.D.   On: 08/10/2021 08:40   ECHOCARDIOGRAM COMPLETE  Result Date: 08/10/2021    ECHOCARDIOGRAM REPORT   Patient Name:   Randy Liu Date of Exam: 08/09/2021 Medical Rec #:  [id number]          Height:       73.0 in Accession #:    [id number]         Weight:       263.0 lb Date of Birth:  October 16, 1950          BSA:          2.417 m Patient Age:    83 years           BP:           131/60 mmHg Patient Gender: M                  HR:           73 bpm. Exam Location:  Inpatient Procedure: 2D Echo, Cardiac Doppler, Color Doppler and Intracardiac            Opacification Agent Indications:     Elevated troponin  History:         Patient has prior history of Echocardiogram examinations, most                  recent 12/13/2020. CHF, Signs/Symptoms:Chest Pain; Risk                  Factors:Hypertension, Diabetes and Dyslipidemia. Long haul                   Covid.  Sonographer:     Glo Herring Sonographer#2:   Merrie Roof RDCS Referring Phys:  [id number] Jonnie Finner  Diagnosing Phys: Rex Kras DO IMPRESSIONS  1. Left ventricular ejection fraction, by estimation, is 55 to 60%. The left ventricle has normal function. Left ventricular endocardial border not optimally defined to evaluate regional wall motion. Left ventricular diastolic parameters are indeterminate 2D images were obtain on 08/09/2021 LVEF 45-50% w/ RWMA along the anterolateral and inferolateral walls. Definity images were obtained 08/10/2021 which notes improvement in LVEF 55-60% without regional wall motion abnormalities.  2. Right ventricular systolic function is normal. The right ventricular size is normal. Mildly increased right ventricular wall thickness.  3. A small pericardial effusion is present. There is no evidence of cardiac tamponade.  4. The mitral valve is normal in structure. Trivial mitral valve regurgitation. No evidence of mitral stenosis.  5. The aortic valve is tricuspid. Aortic valve regurgitation is not visualized. No aortic stenosis is present.  6. The inferior vena cava is normal in size with <50% respiratory variability, suggesting right atrial pressure of 8 mmHg. FINDINGS  Left Ventricle: 2D images were obtain on 08/09/2021 LVEF 45-50% w/ RWMA along the anterolateral and inferolateral walls. Definity images were obtained 08/10/2021 which notes LVEF 55-60% without regional wall motion abnormalities. Left ventricular ejection fraction, by estimation, is 55 to 60%. The left ventricle has normal function. Left ventricular endocardial border not optimally defined to evaluate regional wall motion. Definity contrast agent was given IV to delineate the left ventricular endocardial borders. The left ventricular internal cavity size was normal in size. There is no left ventricular hypertrophy. Left ventricular diastolic parameters are indeterminate. Right Ventricle: The right ventricular  size is normal. Mildly increased right ventricular wall thickness. Right ventricular systolic function is normal. Left Atrium: Left atrial size was normal in size. Right Atrium: Right atrial size was normal in size. Pericardium: A small pericardial effusion is present. The pericardial effusion appears to contain mixed echogenic material. There is no evidence of cardiac tamponade. Mitral Valve: The mitral valve is normal in structure. Trivial mitral valve regurgitation. No evidence of mitral valve stenosis. Tricuspid Valve: The tricuspid valve is grossly normal. Tricuspid valve regurgitation is trivial. No evidence of tricuspid stenosis. Aortic Valve: The aortic valve is tricuspid. Aortic valve regurgitation is not visualized. No aortic stenosis is present. Aortic valve mean gradient measures 3.0 mmHg. Aortic valve peak gradient measures 4.8 mmHg. Aortic valve area, by VTI measures 2.47 cm. Pulmonic Valve: The pulmonic valve was not well visualized. Pulmonic valve regurgitation is trivial. No evidence of pulmonic stenosis. Aorta: The aortic root and ascending aorta are structurally normal, with no evidence of dilitation. Venous: The inferior vena cava is normal in size with less than 50% respiratory variability, suggesting right atrial pressure of 8 mmHg. IAS/Shunts: The interatrial septum was not well visualized.  LEFT VENTRICLE PLAX 2D LVIDd:         3.50 cm     Diastology LVIDs:         2.30 cm     LV e' medial:    3.81 cm/s LV PW:         1.10 cm     LV E/e' medial:  16.4 LV IVS:        1.10 cm     LV e' lateral:   5.00 cm/s LVOT diam:     2.00 cm     LV E/e' lateral: 12.5 LV SV:         55 LV SV Index:   23 LVOT Area:     3.14 cm  LV Volumes (MOD) LV vol  d, MOD A2C: 46.2 ml LV vol d, MOD A4C: 73.1 ml LV vol s, MOD A2C: 14.1 ml LV vol s, MOD A4C: 23.7 ml LV SV MOD A2C:     32.1 ml LV SV MOD A4C:     73.1 ml LV SV MOD BP:      45.2 ml RIGHT VENTRICLE             IVC RV S prime:     11.20 cm/s  IVC diam: 1.30 cm  LEFT ATRIUM           Index LA diam:      4.80 cm 1.99 cm/m LA Vol (A2C): 43.1 ml 17.83 ml/m  AORTIC VALVE                    PULMONIC VALVE AV Area (Vmax):    2.66 cm     PV Vmax:       1.26 m/s AV Area (Vmean):   2.45 cm     PV Peak grad:  6.4 mmHg AV Area (VTI):     2.47 cm AV Vmax:           110.00 cm/s AV Vmean:          75.800 cm/s AV VTI:            0.224 m AV Peak Grad:      4.8 mmHg AV Mean Grad:      3.0 mmHg LVOT Vmax:         93.30 cm/s LVOT Vmean:        59.100 cm/s LVOT VTI:          0.176 m LVOT/AV VTI ratio: 0.79  AORTA Ao Root diam: 3.30 cm Ao Asc diam:  2.80 cm MITRAL VALVE MV Area (PHT): 3.07 cm    SHUNTS MV Decel Time: 247 msec    Systemic VTI:  0.18 m MV E velocity: 62.60 cm/s  Systemic Diam: 2.00 cm MV A velocity: 95.50 cm/s MV E/A ratio:  0.66 Chayah Mckee DO Electronically signed by Rex Kras DO Signature Date/Time: 08/10/2021/1:43:56 PM    Final     CARDIAC DATABASE: EKG: 08/09/2021: NSR, 66 bpm, left axis deviation, poor R wave progression, without underlying injury pattern.   08/09/2021: NSR, 67 bpm, ST depressions in the high lateral leads suggestive of lateral ischemia.  08/10/2021: NSR, 73bpm, subtle ST depression high lateral and lateral leads, PRWP, without injury pattern.    Echocardiogram: 08/09/2021: 1. Left ventricular ejection fraction, by estimation, is 55 to 60%. The left ventricle has normal function. Left ventricular endocardial border not optimally defined to evaluate regional wall motion. Left ventricular diastolic parameters are indeterminate. 2D images were obtain on 08/09/2021 LVEF 45-50% w/ RWMA  along the anterolateral and inferolateral walls. Definity images were  obtained 08/10/2021 which notes improvement in LVEF 55-60% without  regional wall motion abnormalities.   2. Right ventricular systolic function is normal. The right ventricular  size is normal. Mildly increased right ventricular wall thickness.   3. A small pericardial effusion is  present. There is no evidence of  cardiac tamponade.   4. The mitral valve is normal in structure. Trivial mitral valve  regurgitation. No evidence of mitral stenosis.   5. The aortic valve is tricuspid. Aortic valve regurgitation is not  visualized. No aortic stenosis is present.   6. The inferior vena cava is normal in size with <50% respiratory  variability, suggesting right atrial pressure of 8 mmHg.  Stress Testing:  None   Heart Catheterization: None  Scheduled Meds:  aspirin EC  81 mg Oral Daily   carvedilol  12.5 mg Oral BID   ezetimibe  10 mg Oral Daily   fenofibrate  160 mg Oral Daily   fluticasone  1 spray Each Nare Daily   icosapent Ethyl  1 g Oral BID   insulin aspart  0-15 Units Subcutaneous TID WC   insulin aspart  0-5 Units Subcutaneous QHS   latanoprost  1 drop Left Eye QHS   metoCLOPramide  2.5 mg Oral QHS   montelukast  10 mg Oral QHS   pantoprazole  40 mg Oral Daily   simvastatin  20 mg Oral QHS   sodium chloride  1 application Both Eyes QHS   sodium chloride flush  3 mL Intravenous Q12H    Continuous Infusions:  sodium chloride     sodium chloride 100 mL/hr at 08/11/21 0852   heparin 2,350 Units/hr (08/11/21 0511)    PRN Meds: sodium chloride, acetaminophen, bisacodyl, dextromethorphan, meclizine, melatonin, ondansetron (ZOFRAN) IV, phenol, polyvinyl alcohol, sodium chloride flush   IMPRESSION & RECOMMENDATIONS: Randy Liu is a 70 y.o. 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  Impression:  NSTEMI: Epigastric discomfort - not entirely cardiac; but hx limited to due poor functional capacity at baseline.   Dynamic EKG changes, w/ uptrend of hs troponin.  Continue IV Heparin for ACS protocol.  Continue aspirin, Zetia, simvastatin, beta-blockers.   Echo - personally reviewed. LVEF and RWMA have improved since yday night once  A1c 7.9 LDL 136mg /dL; spoke to wife and patient they are will to try Simvastatin.  Tentatively scheduled for left heart catheterization with possible intervention later today.  However, morning creatinine level suggestive of acute kidney injury.  We will increase IV fluids and reevaluate labs at noon.  If creatinine still elevated will consider postponing the heart cath till tomorrow.  Acute kidney injury: Increase IV fluids from 75 cc an hour to 100 cc an hour. BMP at noon Further management per primary team   Hyperlipidemia: Currently not on statin therapy at home; due statin intolerance.  Wife brings in a list that notes he has tried Crestor, Lipitor, and Pravastatin.  Given the NSTEMI both are agreeable to try Simvastatin as an alternative and consider PCSK9 as outpatient.  Fasting lipid profile - reviewed TAG and LDL not well controlled.  Continue Zetia 10 mg p.o. daily.   Hypertriglyceridemia: Continue fenofibrate & Vascepa    Insulin-dependent diabetes mellitus type 2 with complications of neuropathy & hyperglycemia. Currently managed per primary team.   Intractable abdominal pain, nausea, vomiting:  Working diagnosis partial bowel obstruction versus ileus - improved.  NG tube has been d/c.  Surgery has signed off.    Hx of PE: on Eliquis at home; will transition to IV heparin. Last dose night of 08/08/2021.    Will defer management of his other co-morbid conditions and active problem list to primary team for now.   Plan of care discussed w/ nursing staff and primary team after morning rounds.   Patient's questions and concerns were addressed to his satisfaction. He voices understanding of the instructions provided during this encounter.   This note  was created using  a voice recognition software as a result there may be grammatical errors inadvertently enclosed that do not reflect the nature of this encounter. Every attempt is made to correct such errors.  Total time spent: 25 minutes.   Mechele Claude [facility]  Pager: [phone] Office: [phone] 08/11/2021, 9:04 AM

## 2021-08-11 NOTE — Progress Notes (Signed)
ANTICOAGULATION CONSULT NOTE   Pharmacy Consult for heparin Indication: chest pain/ACS, history of DVT/PE  Allergies  Allergen Reactions   Metoclopramide Itching    Loss of Balance; Urinary Incontinence   Nsaids     Other reaction(s): Other (See Comments) Renal Insufficiency Kidney issues    Amoxicillin-Pot Clavulanate Diarrhea and Nausea And Vomiting    Other reaction(s): Other (See Comments) Abdomen Pain Abdomen pain    Statins     Other reaction(s): Other (See Comments) Myalgias and Myositis myalgias    Allopurinol Rash   Empagliflozin     Other reaction(s): Other (See Comments) Fainting, weakness, lightheaded, falling   Tiotropium     Other reaction(s): Other (See Comments) Urinary retention    Patient Measurements: Height: 6\' 1"  Height: 6\' 1"  (185.4 cm) Weight: 124.5 kg (274 lb 7.6 oz) IBW/kg (Calculated) : 79.9 Heparin Dosing Weight: 107.5 kg  Vital Signs: Temp: 98 F (36.7 C) (11/28 0300) Temp Source: Oral (11/28 0300) BP: 112/56 (11/28 0200) Pulse Rate: 64 (11/28 0200)  Labs: Recent Labs    08/09/21 0801 08/09/21 0946 08/09/21 1619 08/10/21 0204 08/10/21 1038 08/10/21 1330 08/10/21 2059 08/11/21 0327  HGB 13.4  --   --  13.4  --   --   --   --   HCT 41.0  --   --  41.9  --   --   --   --   PLT 271  --   --  292  --   --   --   --   APTT  --    < > 29 30 33  --  47* 55*  LABPROT  --   --  14.7  --   --   --   --   --   INR  --   --  1.2  --   --   --   --   --   HEPARINUNFRC  --   --  >1.10* >1.10*  --   --   --  >1.10*  CREATININE 1.28*  --   --   --   --   --   --   --   TROPONINIHS 46*   < > 280*  --  723* 731*  --   --    < > = values in this interval not displayed.     Estimated Creatinine Clearance: 72.1 mL/min (A) (by C-G formula based on SCr of 1.28 mg/dL (H)).     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 Pharmacy consulted for IV heparin dosing. Last dose of Apixaban 5mg  PO BID  reported as 08/08/21 at 2230. CBC WNL. Note that recent Apixaban use can falsely elevate heparin level.   Heparin level > 1.1 (apixaban effects), aPTT 55 sec (subtherapeutic) on gtt at 2200 units/hr. Levels drawn only ~4 hr post gtt rate change so likely haven't seen full effect of increase. No bleeding noted.  Goal of Therapy:  Heparin level 0.3-0.7 units/ml aPTT 66-102 seconds Monitor platelets by anticoagulation protocol: Yes   Plan:  Increase heparin gtt slightly to 2350 units/hr F/u 8 hr aPTT  61, PharmD, BCPS Please see amion for complete clinical pharmacist phone list 08/11/2021 5:03 AM

## 2021-08-11 NOTE — Plan of Care (Signed)
  Problem: Activity: Goal: Risk for activity intolerance will decrease Outcome: Progressing   Problem: Nutrition: Goal: Adequate nutrition will be maintained Outcome: Progressing   Problem: Safety: Goal: Ability to remain free from injury will improve Outcome: Progressing   

## 2021-08-11 NOTE — Progress Notes (Signed)
Pharmacist notified of pt's PTT results. Awaiting on new orders, pt has no signs of active bleeding noted.

## 2021-08-12 ENCOUNTER — Encounter (HOSPITAL_COMMUNITY): Payer: Self-pay | Admitting: Cardiology

## 2021-08-12 ENCOUNTER — Encounter (HOSPITAL_COMMUNITY)
Admission: EM | Disposition: A | Discharge status: Home Health | Payer: Self-pay | Source: Home / Self Care | Attending: Family Medicine

## 2021-08-12 DIAGNOSIS — I25110 Atherosclerotic heart disease of native coronary artery with unstable angina pectoris: Secondary | ICD-10-CM

## 2021-08-12 HISTORY — PX: LEFT HEART CATH AND CORONARY ANGIOGRAPHY: CATH118249

## 2021-08-12 LAB — CBC
HCT: 33.5 % — ABNORMAL LOW (ref 39.0–52.0)
Hemoglobin: 10.9 g/dL — ABNORMAL LOW (ref 13.0–17.0)
MCH: 30.4 pg (ref 26.0–34.0)
MCHC: 32.5 g/dL (ref 30.0–36.0)
MCV: 93.6 fL (ref 80.0–100.0)
Platelets: 210 10*3/uL (ref 150–400)
RBC: 3.58 MIL/uL — ABNORMAL LOW (ref 4.22–5.81)
RDW: 15.3 % (ref 11.5–15.5)
WBC: 6.6 10*3/uL (ref 4.0–10.5)
nRBC: 0 % (ref 0.0–0.2)

## 2021-08-12 LAB — BASIC METABOLIC PANEL
Anion gap: 6 (ref 5–15)
BUN: 29 mg/dL — ABNORMAL HIGH (ref 8–23)
CO2: 25 mmol/L (ref 22–32)
Calcium: 8.7 mg/dL — ABNORMAL LOW (ref 8.9–10.3)
Chloride: 104 mmol/L (ref 98–111)
Creatinine, Ser: 1.41 mg/dL — ABNORMAL HIGH (ref 0.61–1.24)
GFR, Estimated: 53 mL/min — ABNORMAL LOW (ref >60)
Glucose, Bld: 147 mg/dL — ABNORMAL HIGH (ref 70–99)
Potassium: 3.9 mmol/L (ref 3.5–5.1)
Sodium: 135 mmol/L (ref 135–145)

## 2021-08-12 LAB — APTT: aPTT: >200 seconds (ref 24–36)

## 2021-08-12 LAB — GLUCOSE, CAPILLARY
Glucose-Capillary: 151 mg/dL — ABNORMAL HIGH (ref 70–99)
Glucose-Capillary: 195 mg/dL — ABNORMAL HIGH (ref 70–99)
Glucose-Capillary: 183 mg/dL — ABNORMAL HIGH (ref 70–99)
Glucose-Capillary: 214 mg/dL — ABNORMAL HIGH (ref 70–99)

## 2021-08-12 LAB — HEPARIN LEVEL (UNFRACTIONATED): Heparin Unfractionated: >1.1 IU/mL — ABNORMAL HIGH (ref 0.30–0.70)

## 2021-08-12 SURGERY — LEFT HEART CATH AND CORONARY ANGIOGRAPHY
Anesthesia: LOCAL

## 2021-08-12 MED ORDER — SODIUM CHLORIDE 0.9% FLUSH
3.0000 mL | Freq: Two times a day (BID) | INTRAVENOUS | Status: DC
  Administered 2021-08-12 – 2021-08-13 (×2): 3 mL via INTRAVENOUS

## 2021-08-12 MED ORDER — HEPARIN (PORCINE) IN NACL 1000-0.9 UT/500ML-% IV SOLN
INTRAVENOUS | Status: DC | PRN
  Administered 2021-08-12 (×2): 500 mL

## 2021-08-12 MED ORDER — LIDOCAINE HCL (PF) 1 % IJ SOLN
INTRAMUSCULAR | Status: DC | PRN
  Administered 2021-08-12: 3 mL

## 2021-08-12 MED ORDER — SODIUM CHLORIDE 0.9 % WEIGHT BASED INFUSION
1.0000 mL/kg/h | INTRAVENOUS | Status: DC
  Administered 2021-08-12: 1 mL/kg/h via INTRAVENOUS

## 2021-08-12 MED ORDER — ASPIRIN 81 MG PO CHEW
81.0000 mg | CHEWABLE_TABLET | ORAL | Status: AC
  Administered 2021-08-12: 81 mg via ORAL

## 2021-08-12 MED ORDER — MIDAZOLAM HCL 2 MG/2ML IJ SOLN
INTRAMUSCULAR | Status: AC
  Filled 2021-08-12: qty 2

## 2021-08-12 MED ORDER — APIXABAN 5 MG PO TABS
5.0000 mg | ORAL_TABLET | Freq: Two times a day (BID) | ORAL | Status: DC
  Administered 2021-08-12 – 2021-08-13 (×2): 5 mg via ORAL
  Filled 2021-08-12 (×2): qty 1

## 2021-08-12 MED ORDER — VERAPAMIL HCL 2.5 MG/ML IV SOLN
INTRAVENOUS | Status: DC | PRN
  Administered 2021-08-12: 5 mL via INTRA_ARTERIAL

## 2021-08-12 MED ORDER — IOHEXOL 350 MG/ML SOLN
INTRAVENOUS | Status: DC | PRN
  Administered 2021-08-12: 55 mL

## 2021-08-12 MED ORDER — SODIUM CHLORIDE 0.9 % IV SOLN: 250.0000 mL | INTRAVENOUS | Status: DC | PRN

## 2021-08-12 MED ORDER — SODIUM CHLORIDE 0.9 % WEIGHT BASED INFUSION
1.0000 mL/kg/h | INTRAVENOUS | Status: AC
  Administered 2021-08-12 (×2): 1 mL/kg/h via INTRAVENOUS

## 2021-08-12 MED ORDER — SODIUM CHLORIDE 0.9% FLUSH: 3.0000 mL | INTRAVENOUS | Status: DC | PRN

## 2021-08-12 MED ORDER — HEPARIN SODIUM (PORCINE) 1000 UNIT/ML IJ SOLN
INTRAMUSCULAR | Status: DC | PRN
  Administered 2021-08-12: 4000 [IU] via INTRAVENOUS
  Administered 2021-08-12: 5000 [IU] via INTRAVENOUS

## 2021-08-12 MED ORDER — SODIUM CHLORIDE 0.9% FLUSH: 3.0000 mL | Freq: Two times a day (BID) | INTRAVENOUS | Status: DC

## 2021-08-12 MED ORDER — HYDRALAZINE HCL 20 MG/ML IJ SOLN: 10.0000 mg | INTRAMUSCULAR | Status: AC | PRN

## 2021-08-12 MED ORDER — ASPIRIN EC 81 MG PO TBEC
81.0000 mg | DELAYED_RELEASE_TABLET | Freq: Every day | ORAL | Status: DC
  Administered 2021-08-13: 81 mg via ORAL
  Filled 2021-08-12: qty 1

## 2021-08-12 MED ORDER — SIMVASTATIN 20 MG PO TABS
10.0000 mg | ORAL_TABLET | Freq: Every day | ORAL | Status: DC
  Administered 2021-08-12: 10 mg via ORAL

## 2021-08-12 MED ORDER — CLOPIDOGREL BISULFATE 75 MG PO TABS
ORAL_TABLET | ORAL | Status: AC
  Filled 2021-08-12: qty 1

## 2021-08-12 MED ORDER — SODIUM CHLORIDE 0.9 % WEIGHT BASED INFUSION
3.0000 mL/kg/h | INTRAVENOUS | Status: DC
  Administered 2021-08-12: 3 mL/kg/h via INTRAVENOUS

## 2021-08-12 MED ORDER — MIDAZOLAM HCL 2 MG/2ML IJ SOLN
INTRAMUSCULAR | Status: DC | PRN
  Administered 2021-08-12: 2 mg via INTRAVENOUS

## 2021-08-12 MED ORDER — HEPARIN (PORCINE) IN NACL 1000-0.9 UT/500ML-% IV SOLN
INTRAVENOUS | Status: AC
  Filled 2021-08-12: qty 1000

## 2021-08-12 MED ORDER — FENTANYL CITRATE (PF) 100 MCG/2ML IJ SOLN
INTRAMUSCULAR | Status: AC
  Filled 2021-08-12: qty 2

## 2021-08-12 MED ORDER — HEPARIN (PORCINE) 25000 UT/250ML-% IV SOLN
1950.0000 [IU]/h | INTRAVENOUS | Status: DC
  Administered 2021-08-12: 1950 [IU]/h via INTRAVENOUS

## 2021-08-12 MED ORDER — CLOPIDOGREL BISULFATE 75 MG PO TABS
75.0000 mg | ORAL_TABLET | Freq: Every day | ORAL | Status: DC
  Administered 2021-08-12 – 2021-08-13 (×2): 75 mg via ORAL
  Filled 2021-08-12: qty 1

## 2021-08-12 MED ORDER — VERAPAMIL HCL 2.5 MG/ML IV SOLN
INTRAVENOUS | Status: AC
  Filled 2021-08-12: qty 2

## 2021-08-12 MED ORDER — HEPARIN SODIUM (PORCINE) 1000 UNIT/ML IJ SOLN
INTRAMUSCULAR | Status: AC
  Filled 2021-08-12: qty 10

## 2021-08-12 MED ORDER — HEPARIN (PORCINE) IN NACL 1000-0.9 UT/500ML-% IV SOLN
INTRAVENOUS | Status: AC
  Filled 2021-08-12: qty 500

## 2021-08-12 MED ORDER — LIDOCAINE HCL (PF) 1 % IJ SOLN
INTRAMUSCULAR | Status: AC
  Filled 2021-08-12: qty 30

## 2021-08-12 MED ORDER — FENTANYL CITRATE (PF) 100 MCG/2ML IJ SOLN
INTRAMUSCULAR | Status: DC | PRN
  Administered 2021-08-12: 25 ug via INTRAVENOUS

## 2021-08-12 SURGICAL SUPPLY — 10 items
CATH OPTITORQUE TIG 4.0 5F (CATHETERS) ×1 IMPLANT
DEVICE RAD COMP TR BAND LRG (VASCULAR PRODUCTS) ×1 IMPLANT
GLIDESHEATH SLEND A-KIT 6F 22G (SHEATH) ×1 IMPLANT
GUIDEWIRE INQWIRE 1.5J.035X260 (WIRE) IMPLANT
INQWIRE 1.5J .035X260CM (WIRE) ×2
KIT HEART LEFT (KITS) ×2 IMPLANT
MAT PREVALON FULL STRYKER (MISCELLANEOUS) ×1 IMPLANT
PACK CARDIAC CATHETERIZATION (CUSTOM PROCEDURE TRAY) ×2 IMPLANT
TRANSDUCER W/STOPCOCK (MISCELLANEOUS) ×2 IMPLANT
TUBING CIL FLEX 10 FLL-RA (TUBING) ×2 IMPLANT

## 2021-08-12 NOTE — Discharge Instructions (Signed)

## 2021-08-12 NOTE — Progress Notes (Signed)
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  He also reported substernal chest tightness associated with left arm numbness that resolved after 2 to 3 hours. Patient had similar presentation few years back and found to have a small bowel obstruction.CT abdomen and pelvis shows findings concerning with ileus versus partial small bowel obstruction.  NG tube was placed.  General surgery consulted.  Patient able to pass flatus.  Ileus resolving, NG tube removed on 08/10/2021.  Seen by cardiology, elevated troponin, meeting criteria for NSTEMI.  Started on heparin drip.  Transferred to [name] on 08/10/2021 for cardiac cath which was completed today/08/12/2021 and was found to have multivessel disease.  Assessment & Plan:   Principal Problem:   Ileus ([place]) Active Problems:   Non-ST elevation (NSTEMI) myocardial infarction ([place])   Type 2 diabetes mellitus with hyperlipidemia (HCC)   Hypertriglyceridemia   Intractable abdominal pain   Nausea and vomiting   History of pulmonary embolism   Statin intolerance   NSTEMI (non-ST elevated myocardial infarction) (HCC)   Elevated troponin   AKI (acute kidney injury) ([place])   Pure hypercholesterolemia   Coronary artery disease involving native coronary artery of native heart with unstable angina pectoris (HCC)  Ileus/partial small bowel obstruction: Clinically improving, no nausea.  Tolerating soft diet.  Passing flatus.  X-ray day before yesterday shows some portion of small bowel obstruction is still remaining.  NG tube removed while he was still at [facility].  Monitor with advancing diet.  Repeat abdominal x-ray tomorrow.  NSTEMI with cardiomyopathy: Echo shows 55  to 60% ejection fraction and no wall motion abnormality.  Small pericardial effusion.  No chest pain.  Underwent cardiac catheter today and was found to have multivessel disease.  Per cardiology note, ideally he should have CABG however patient is mostly bedbound and wheelchair-bound due to his neuropathy and obesity and overall deconditioning and they have discussed with patient and the wife and they have opted for multivessel PCI, perhaps as outpatient at some point in time.  They have added aspirin and Plavix.  Hyperlipidemia: Continue Zetia  GERD: Continue Protonix  Chronic HFpEF: Euvolemic, continue Coreg.  History of DVT and PE: Heparin stopped, he is resumed back on Eliquis by cardiology.  DM2: Continue regular insulin sliding scale.  Acute kidney injury: Creatinine slightly better than yesterday.  Continue IV fluids.  Repeat labs in the morning.  DVT prophylaxis: IV heparin Code Status: (Full code. Family Communication: Wife at bedside. Disposition Plan: Discharge home once cleared by cardiology.  Status is: Inpatient  Remains inpatient appropriate because: Needs cardiac cath.  Consultants:  General surgery Cardiology  Procedures:   Antimicrobials:  Anti-infectives (From admission, onward)    Start     Dose/Rate Route Frequency Ordered Stop   08/09/21 2200  doxycycline (VIBRA-TABS) tablet 100 mg        100 mg Oral 2 times daily 08/09/21 1508 08/10/21 2110       Subjective:  Patient seen and examined prior to going for cardiac cath, wife at the bedside.  Patient had no complaints.  No chest pain, abdominal pain, passing flatus.  Objective: Vitals:   08/12/21 1019 08/12/21 1024 08/12/21 1028 08/12/21 1033  BP: 137/66 132/71 Marland Kitchen)  141/70 (!) 142/75  Pulse: 62 67 65 65  Resp: 15 15 14 14   Temp:      TempSrc:      SpO2: 98% 98% 98% 98%  Weight:      Height:        Intake/Output Summary (Last 24 hours) at 08/12/2021 1210 Last data filed at 08/12/2021  0954 Gross per 24 hour  Intake 3495.67 ml  Output 2300 ml  Net 1195.67 ml    Filed Weights   08/10/21 1739 08/11/21 0300 08/12/21 0406  Weight: 122.3 kg 124.5 kg 128.3 kg    Examination:  General exam: Appears calm and comfortable, obese Respiratory system: Clear to auscultation. Respiratory effort normal. Cardiovascular system: S1 & S2 heard, RRR. No JVD, murmurs, rubs, gallops or clicks.  +1 pitting edema bilateral lower extremity. Gastrointestinal system: Abdomen is nondistended, soft and nontender. No organomegaly or masses felt. Normal bowel sounds heard. Central nervous system: Alert and oriented. No focal neurological deficits. Skin: No rashes, lesions or ulcers.     Data Reviewed: I have personally reviewed following labs and imaging studies  CBC: Recent Labs  Lab 08/09/21 0801 08/10/21 0204 08/12/21 0209  WBC 7.1 10.7* 6.6  NEUTROABS 4.3  --   --   HGB 13.4 13.4 10.9*  HCT 41.0 41.9 33.5*  MCV 94.5 94.6 93.6  PLT 271 292 [id number]    Basic Metabolic Panel: Recent Labs  Lab 08/09/21 0801 08/11/21 0327 08/11/21 1220 08/12/21 0209  NA 136 135 135 135  K 4.3 4.0 4.5 3.9  CL 100 101 101 104  CO2 27 25 26 25   GLUCOSE 213* 137* 185* 147*  BUN 33* 34* 32* 29*  CREATININE 1.28* 1.58* 1.63* 1.41*  CALCIUM 9.3 9.0 8.9 8.7*    GFR: Estimated Creatinine Clearance: 66.5 mL/min (A) (by C-G formula based on SCr of 1.41 mg/dL (H)). Liver Function Tests: Recent Labs  Lab 08/09/21 0801  AST 23  ALT 21  ALKPHOS 42  BILITOT 0.4  PROT 7.1  ALBUMIN 3.3*    Recent Labs  Lab 08/09/21 0801  LIPASE 29    No results for input(s): AMMONIA in the last 168 hours. Coagulation Profile: Recent Labs  Lab 08/09/21 1619  INR 1.2    Cardiac Enzymes: No results for input(s): CKTOTAL, CKMB, CKMBINDEX, TROPONINI in the last 168 hours. BNP (last 3 results) No results for input(s): PROBNP in the last 8760 hours. HbA1C: Recent Labs    08/09/21 1619  HGBA1C 8.0*     CBG: Recent Labs  Lab 08/11/21 1127 08/11/21 1636 08/11/21 2129 08/12/21 0610 08/12/21 1118  GLUCAP 179* 190* 169* 151* 195*    Lipid Profile: Recent Labs    08/10/21 0204  CHOL 223*  HDL 24*  LDLCALC 136*  TRIG 313*  CHOLHDL 9.3    Thyroid Function Tests: No results for input(s): TSH, T4TOTAL, FREET4, T3FREE, THYROIDAB in the last 72 hours. Anemia Panel: No results for input(s): VITAMINB12, FOLATE, FERRITIN, TIBC, IRON, RETICCTPCT in the last 72 hours. Sepsis Labs: No results for input(s): PROCALCITON, LATICACIDVEN in the last 168 hours.  Recent Results (from the past 240 hour(s))  Resp Panel by RT-PCR (Flu A&B, Covid) Nasopharyngeal Swab     Status: None   Collection Time: 08/09/21  8:14 AM   Specimen: Nasopharyngeal Swab; Nasopharyngeal(NP) swabs in vial transport medium  Result Value Ref Range Status   SARS Coronavirus 2 by RT PCR NEGATIVE NEGATIVE Final    Comment: (NOTE) SARS-CoV-2 target nucleic acids are  NOT DETECTED.  The SARS-CoV-2 RNA is generally detectable in upper respiratory specimens during the acute phase of infection. The lowest concentration of SARS-CoV-2 viral copies this assay can detect is 138 copies/mL. A negative result does not preclude SARS-Cov-2 infection and should not be used as the sole basis for treatment or other patient management decisions. A negative result may occur with  improper specimen collection/handling, submission of specimen other than nasopharyngeal swab, presence of viral mutation(s) within the areas targeted by this assay, and inadequate number of viral copies(<138 copies/mL). A negative result must be combined with clinical observations, patient history, and epidemiological information. The expected result is Negative.  Fact Sheet for Patients:  EntrepreneurPulse.com.au  Fact Sheet for Healthcare Providers:  IncredibleEmployment.be  This test is no t yet approved or  cleared by the [place] FDA and  has been authorized for detection and/or diagnosis of SARS-CoV-2 by FDA under an Emergency Use Authorization (EUA). This EUA will remain  in effect (meaning this test can be used) for the duration of the COVID-19 declaration under Section 564(b)(1) of the Act, 21 U.S.C.section 360bbb-3(b)(1), unless the authorization is terminated  or revoked sooner.       Influenza A by PCR NEGATIVE NEGATIVE Final   Influenza B by PCR NEGATIVE NEGATIVE Final    Comment: (NOTE) The Xpert Xpress SARS-CoV-2/FLU/RSV plus assay is intended as an aid in the diagnosis of influenza from Nasopharyngeal swab specimens and should not be used as a sole basis for treatment. Nasal washings and aspirates are unacceptable for Xpert Xpress SARS-CoV-2/FLU/RSV testing.  Fact Sheet for Patients: EntrepreneurPulse.com.au  Fact Sheet for Healthcare Providers: IncredibleEmployment.be  This test is not yet approved or cleared by the [place] FDA and has been authorized for detection and/or diagnosis of SARS-CoV-2 by FDA under an Emergency Use Authorization (EUA). This EUA will remain in effect (meaning this test can be used) for the duration of the COVID-19 declaration under Section 564(b)(1) of the Act, 21 U.S.C. section 360bbb-3(b)(1), unless the authorization is terminated or revoked.  Performed at [facility], [address] [place], [place] [address]      Radiology Studies: CARDIAC CATHETERIZATION  Result Date: 08/12/2021 Left Heart Catheterization 08/12/21: LV: 113/6, EDP 18 mmHg.  Ao 112/50, mean 75 mmHg.  No pressure gradient across the aortic valve. LM: Large vessel, 10 to 20% stenosis. LAD: Large vessel, it is occluded after giving origin to a small D1.  Mid to distal LAD is supplied by contralateral collaterals from the RCA.  D1 is diffusely diseased and is occluded in the mid to distal segment.  Mid to distal LAD  appears to be widely patent with mild disease and a good target for bypass. RI: Small vessel, no significant disease. CX: Large vessel.  Gives origin to large OM1 which is a high-grade 95% proximal stenosis followed by 20 to 30% diffuse mid segment stenosis.  Distal vessel is a good target for bypass.  There is a large OM 2 that is ulcerated and occluded in the proximal and mid segment and appears to be diffusely diseased in the proximal and mid segment.  It is a large vessel and distal vessel appears to be a good target for bypass. RCA: Dominant, large vessel.  Gives origin to 2 PDA branches, proximal PDA branch is smooth and normal.  After this distal RCA has a high-grade 90 to 95% bifurcation stenosis involving the PL branch.  Distal RCA gives excellent collaterals to the LAD. Impression: Mild  increase in LVEDP.  LV gram not performed to conserve contrast.  Multivessel disease, ideally speaking would benefit from CABG.  However patient is essentially bedbound and wheelchair-bound due to obesity, peripheral neuropathy and generalized deconditioning.  Discussed with wife, patient does not appear to be a good candidate for CABG as his recovery would be extremely complicated.  Best option is to proceed with multivessel PCI electively, in spite of two-vessel disease, EF appears to be preserved.  I would like him to recuperate from his acute renal failure.  I have discussed all the options with patient's wife who is in agreement and favors conservative therapy/PCI over surgical therapy.  50 mL contrast utilized. Patient presently on Eliquis for PE, I have started him on aspirin along with Plavix for now.    Scheduled Meds:  apixaban  5 mg Oral BID   [START ON 08/13/2021] aspirin EC  81 mg Oral Daily   carvedilol  12.5 mg Oral BID   clopidogrel  75 mg Oral Daily   ezetimibe  10 mg Oral Daily   fenofibrate  160 mg Oral Daily   fluticasone  1 spray Each Nare Daily   icosapent Ethyl  1 g Oral BID   insulin  aspart  0-15 Units Subcutaneous TID WC   insulin aspart  0-5 Units Subcutaneous QHS   latanoprost  1 drop Left Eye QHS   metoCLOPramide  2.5 mg Oral QHS   montelukast  10 mg Oral QHS   pantoprazole  40 mg Oral Daily   simvastatin  10 mg Oral q1800   simvastatin  20 mg Oral QHS   sodium chloride  1 application Both Eyes QHS   sodium chloride flush  3 mL Intravenous Q12H   sodium chloride flush  3 mL Intravenous Q12H   Continuous Infusions:  sodium chloride     sodium chloride 1 mL/kg/hr (08/12/21 1132)    LOS: 2 days   Time spent: 30 mins  Hughie Closs, MD [facility]  If 7PM-7AM, please contact night-coverage

## 2021-08-12 NOTE — Progress Notes (Signed)
Notified pharmacy of critical APTT. Ordered to hold gtt for 1hr.

## 2021-08-12 NOTE — CV Procedure (Signed)
       Occluded OM2 with faint collaterals. High degree OM-1 stenosis. LAD occluded in proximal to mid segment with  collaterals from RCA. Distal RCA bifurcation 90% stenosis.  Yates Decamp, MD, [facility] 08/12/2021, 11:28 AM Office: [phone] Fax: [phone] Pager: [phone]

## 2021-08-12 NOTE — Interval H&P Note (Signed)
History and Physical Interval Note:  08/12/2021 10:02 AM  Randy Liu  has presented today for surgery, with the diagnosis of NSTEMI.  The various methods of treatment have been discussed with the patient and family. After consideration of risks, benefits and other options for treatment, the patient has consented to  Procedure(s): LEFT HEART CATH AND CORONARY ANGIOGRAPHY (N/A) and possible coronary angioplasty Cath Lab Visit (complete for each Cath Lab visit)  Clinical Evaluation Leading to the Procedure:   ACS: Yes.    Non-ACS:    Anginal Classification: CCS IV  Anti-ischemic medical therapy: Minimal Therapy (1 class of medications)  Non-Invasive Test Results: No non-invasive testing performed  Prior CABG: No previous CABG      as a surgical intervention.  The patient's history has been reviewed, patient examined, no change in status, stable for surgery.  I have reviewed the patient's chart and labs.  Questions were answered to the patient's satisfaction.     Yates Decamp

## 2021-08-12 NOTE — Progress Notes (Signed)
ANTICOAGULATION CONSULT NOTE   Pharmacy Consult for heparin Indication: chest pain/ACS, history of DVT/PE  Allergies  Allergen Reactions   Metoclopramide Itching    Loss of Balance; Urinary Incontinence   Nsaids     Other reaction(s): Other (See Comments) Renal Insufficiency Kidney issues    Amoxicillin-Pot Clavulanate Diarrhea and Nausea And Vomiting    Other reaction(s): Other (See Comments) Abdomen Pain Abdomen pain    Statins     Other reaction(s): Other (See Comments) Myalgias and Myositis myalgias    Allopurinol Rash   Empagliflozin     Other reaction(s): Other (See Comments) Fainting, weakness, lightheaded, falling   Tiotropium     Other reaction(s): Other (See Comments) Urinary retention    Patient Measurements: Height: 6\' 1"  Height: 6\' 1"  (185.4 cm) Weight: 128.3 kg (282 lb 13.6 oz) IBW/kg (Calculated) : 79.9 Heparin Dosing Weight: 107.5 kg  Vital Signs: Temp: 97.7 F (36.5 C) (11/29 0406) Temp Source: Oral (11/29 0006) BP: 117/48 (11/29 0406) Pulse Rate: 57 (11/29 0406)  Labs: Recent Labs    08/09/21 0801 08/09/21 0946 08/09/21 1619 08/10/21 0204 08/10/21 1038 08/10/21 1330 08/10/21 2059 08/11/21 0327 08/11/21 1220 08/11/21 1410 08/12/21 0209  HGB 13.4  --   --  13.4  --   --   --   --   --   --  10.9*  HCT 41.0  --   --  41.9  --   --   --   --   --   --  33.5*  PLT 271  --   --  292  --   --   --   --   --   --  210  APTT  --    < > 29 30 33  --    < > 55* >200* >200* >200*  LABPROT  --   --  14.7  --   --   --   --   --   --   --   --   INR  --   --  1.2  --   --   --   --   --   --   --   --   HEPARINUNFRC  --    < > >1.10* >1.10*  --   --   --  >1.10*  --   --  >1.10*  CREATININE 1.28*  --   --   --   --   --   --  1.58* 1.63*  --  1.41*  TROPONINIHS 46*   < > 280*  --  723* 731*  --   --   --   --   --    < > = values in this interval not displayed.     Estimated Creatinine Clearance: 66.5 mL/min (A) (by C-G formula based on  SCr of 1.41 mg/dL (H)).   Assessment: 10 y/oM with PMH of DVT/PE on Apixaban admitted with chest pain and pSBO. Pharmacy consulted for IV heparin dosing. Last dose of Apixaban 5mg  PO BID reported as 08/08/21 at 2230. CBC WNL. Note that recent Apixaban use can falsely elevate heparin level.   Heparin level > 1.1 (effects of apixaban), aPTT >200 on gtt at 2250 units/hr. No issues with line or bleeding reported per RN. RN reports that heparin level drawn from arm opposite where heparin gtt running so should be accurate.  Goal of Therapy:  Heparin level 0.3-0.7 units/ml aPTT 66-102 seconds Monitor platelets by anticoagulation  protocol: Yes   Plan:  Hold heparin drip for 1 hour and then restart heparin gtt at 1950 units/hr F/u 8 hr aPTT  Sherlon Handing, PharmD, BCPS Please see amion for complete clinical pharmacist phone list 08/12/2021, 5:16 AM

## 2021-08-12 NOTE — Progress Notes (Signed)
Inpatient Diabetes Program Recommendations  AACE/ADA: New Consensus Statement on Inpatient Glycemic Control (2015)  Target Ranges:  Prepandial:   less than 140 mg/dL      Peak postprandial:   less than 180 mg/dL (1-2 hours)      Critically ill patients:  140 - 180 mg/dL   Lab Results  Component Value Date   GLUCAP 195 (H) 08/12/2021   HGBA1C 8.0 (H) 08/09/2021    Review of Glycemic Control  Latest Reference Range & Units 08/10/21 21:10 08/11/21 06:06 08/11/21 11:27 08/11/21 16:36 08/11/21 21:29 08/12/21 06:10 08/12/21 11:18  Glucose-Capillary 70 - 99 mg/dL 287 (H) 867 (H) 672 (H) 190 (H) 169 (H) 151 (H) 195 (H)   Diabetes history: DM 2 Outpatient Diabetes medications:  Novolog 70/30 flexpen- 30 units bid Current orders for Inpatient glycemic control:  Novolog moderate tid with meals and HS  Inpatient Diabetes Program Recommendations:    May consider adding Semglee 10 units daily.   Thanks,  Beryl Meager, RN, BC-ADM Inpatient Diabetes Coordinator Pager [phone]  (8a-5p)

## 2021-08-12 NOTE — Progress Notes (Signed)
TR band removed- not signs of bleeding or hematoma noted. Patient instructed to keep with minimal use to right arm.

## 2021-08-13 DIAGNOSIS — I2510 Atherosclerotic heart disease of native coronary artery without angina pectoris: Secondary | ICD-10-CM

## 2021-08-13 LAB — BASIC METABOLIC PANEL
Anion gap: 7 (ref 5–15)
BUN: 22 mg/dL (ref 8–23)
CO2: 20 mmol/L — ABNORMAL LOW (ref 22–32)
Calcium: 8.8 mg/dL — ABNORMAL LOW (ref 8.9–10.3)
Chloride: 106 mmol/L (ref 98–111)
Creatinine, Ser: 1.25 mg/dL — ABNORMAL HIGH (ref 0.61–1.24)
GFR, Estimated: >60 mL/min (ref >60)
Glucose, Bld: 193 mg/dL — ABNORMAL HIGH (ref 70–99)
Potassium: 4 mmol/L (ref 3.5–5.1)
Sodium: 133 mmol/L — ABNORMAL LOW (ref 135–145)

## 2021-08-13 LAB — GLUCOSE, CAPILLARY
Glucose-Capillary: 206 mg/dL — ABNORMAL HIGH (ref 70–99)
Glucose-Capillary: 211 mg/dL — ABNORMAL HIGH (ref 70–99)

## 2021-08-13 MED ORDER — ASPIRIN 81 MG PO TBEC: 81.0000 mg | DELAYED_RELEASE_TABLET | Freq: Every day | ORAL | 0 refills | Status: AC

## 2021-08-13 MED ORDER — INSULIN GLARGINE-YFGN 100 UNIT/ML ~~LOC~~ SOLN
10.0000 [IU] | Freq: Every day | SUBCUTANEOUS | Status: DC
  Administered 2021-08-13: 10 [IU] via SUBCUTANEOUS
  Filled 2021-08-13: qty 0.1

## 2021-08-13 MED ORDER — SIMVASTATIN 10 MG PO TABS: 10.0000 mg | ORAL_TABLET | Freq: Every day | ORAL | 0 refills | Status: DC

## 2021-08-13 MED ORDER — CLOPIDOGREL BISULFATE 75 MG PO TABS: 75.0000 mg | ORAL_TABLET | Freq: Every day | ORAL | 0 refills | Status: DC

## 2021-08-13 MED ORDER — ICOSAPENT ETHYL 0.5 G PO CAPS: 1.0000 g | ORAL_CAPSULE | Freq: Two times a day (BID) | ORAL | 0 refills | Status: DC

## 2021-08-13 NOTE — Progress Notes (Signed)
Pt d/c to home with ems and spouse at bedside, discussed d/c instructions. No concerns noted.

## 2021-08-13 NOTE — Discharge Summary (Signed)
Physician Discharge Summary  Randy Liu [id number] DOB: 03-18-1951 DOA: 08/09/2021  PCP: [name], MD  Admit date: 08/09/2021 Discharge date: 08/13/2021 30 Day Unplanned Readmission Risk Score    Flowsheet Row ED to Hosp-Admission (Current) from 08/09/2021 in [facility]  30 Day Unplanned Readmission Risk Score (%) 29.09 Filed at 08/13/2021 0801       This score is the patient's risk of an unplanned readmission within 30 days of being discharged (0 -100%). The score is based on dignosis, age, lab data, medications, orders, and past utilization.   Low:  0-14.9   Medium: 15-21.9   High: 22-29.9   Extreme: 30 and above          Admitted From: Home Disposition: Home  Recommendations for Outpatient Follow-up:  Follow up with PCP in 1-2 weeks Please obtain BMP/CBC in one week Follow-up with your cardiologist on 08/20/2021. Please follow up with your PCP on the following pending results: Unresulted Labs (From admission, onward)     Start     Ordered   08/11/21 [id number]  Basic metabolic panel  Daily,   R      08/10/21 2023              Home Health: Yes Equipment/Devices: None  Discharge Condition: Stable CODE STATUS: Full code Diet recommendation: Cardiac  Subjective: Seen and examined.  Wife at the bedside.  Patient has no complaints.  No abdominal pain, nausea or vomiting.  Tolerating diet.  Passing flatus.  Agreeable to go home today.  Brief/Interim Summary: This 70 years old male with PMH significant for HFpEF , DM 2, hyperlipidemia, GERD, DVT and PE on anticoagulation presented in the ED with complaints of abdominal pain associated with nausea and vomiting.  He also reported substernal chest tightness associated with left arm numbness that resolved after 2 to 3 hours. Patient had similar presentation few years back and found to have a small bowel obstruction.CT abdomen and pelvis shows findings concerning with ileus versus partial small  bowel obstruction.  NG tube was placed.  General surgery consulted.  Patient able to pass flatus.  Ileus resolved NG tube removed on 08/10/2021.  Seen by cardiology, elevated troponin, meeting criteria for NSTEMI.  Started on heparin drip.  Transferred to [name] on 08/10/2021 for cardiac cath which was completed on 08/12/2021 and was found to have multivessel disease. Per cardiology note, ideally he should have CABG however patient is mostly bedbound and wheelchair-bound due to his neuropathy and obesity and overall deconditioning and they have discussed with patient and the wife and they have opted for multivessel PCI as outpatient at some point in time.  They have added aspirin, Plavix and Vascepa and recommended to continue Coreg.   Ileus/partial small bowel obstruction: Resolved, tolerated regular diet, passing flatus but no bowel movement yet, likely due to his chronic immobility, patient is going to get Dulcolax suppository before discharge.   Hyperlipidemia: Continue Zetia   GERD: Continue Protonix   Chronic HFpEF: Euvolemic, continue Coreg.   History of DVT and PE: Heparin stopped, he is resumed back on Eliquis by cardiology.   DM2: Resume home medications.   Acute kidney injury: Creatinine now improving and very close to baseline.  Peaked at 1.6.  Discharge Diagnoses:  Principal Problem:   Ileus (Randy Liu) Active Problems:   Non-ST elevation (NSTEMI) myocardial infarction ([name])   Type 2 diabetes mellitus with hyperlipidemia (HCC)   Hypertriglyceridemia   Intractable abdominal pain   Nausea and  vomiting   History of pulmonary embolism   Statin intolerance   NSTEMI (non-ST elevated myocardial infarction) (HCC)   Elevated troponin   AKI (acute kidney injury) ([name])   Pure hypercholesterolemia   Coronary artery disease involving native coronary artery of native heart with unstable angina pectoris ([name])   Atherosclerosis of native coronary artery of native heart without angina  pectoris    Discharge Instructions   Allergies as of 08/13/2021       Reactions   Metoclopramide Itching   Loss of Balance; Urinary Incontinence   Nsaids    Other reaction(s): Other (See Comments) Renal Insufficiency Kidney issues   Amoxicillin-pot Clavulanate Diarrhea, Nausea And Vomiting   Other reaction(s): Other (See Comments) Abdomen Pain Abdomen pain   Statins    Other reaction(s): Other (See Comments) Myalgias and Myositis myalgias   Allopurinol Rash   Empagliflozin    Other reaction(s): Other (See Comments) Fainting, weakness, lightheaded, falling   Tiotropium    Other reaction(s): Other (See Comments) Urinary retention        Medication List     TAKE these medications    acetaminophen 500 MG tablet Commonly known as: TYLENOL Take 1,000 mg by mouth every 8 (eight) hours as needed for moderate pain.   Align 4 MG Caps Take 4 mg by mouth daily.   Alpha-Lipoic Acid 300 MG Tabs Take 600 mg by mouth daily.   aspirin 81 MG EC tablet Take 1 tablet (81 mg total) by mouth daily. Swallow whole. Start taking on: August 14, 2021   benzonatate 200 MG capsule Commonly known as: TESSALON Take 200 mg by mouth every 8 (eight) hours as needed for cough.   bisacodyl 10 MG suppository Commonly known as: DULCOLAX Place 1 suppository (10 mg total) rectally daily as needed for up to 10 doses for moderate constipation.   Blink Tears 0.25 % Soln Generic drug: Polyethylene Glycol 400 Place 1 drop into both eyes daily as needed (dry eyes).   carvedilol 12.5 MG tablet Commonly known as: COREG Take 12.5 mg by mouth 2 (two) times daily with a meal. What changed: Another medication with the same name was removed. Continue taking this medication, and follow the directions you see here.   Cholecalciferol 25 MCG (1000 UT) capsule Take 4,000 Units by mouth daily.   clopidogrel 75 MG tablet Commonly known as: PLAVIX Take 1 tablet (75 mg total) by mouth daily. Start  taking on: August 14, 2021   colchicine-probenecid 0.5-500 MG tablet Take 1 tablet by mouth daily.   cyanocobalamin 1000 MCG/ML injection Commonly known as: (VITAMIN B-12) Inject 1,000 mcg into the skin every 30 (thirty) days.   doxycycline 100 MG tablet Commonly known as: VIBRA-TABS Take 100 mg by mouth 2 (two) times daily.   Eliquis 5 MG Tabs tablet Generic drug: apixaban Take 1 tablet (5 mg total) by mouth 2 (two) times daily. Resume on 05/11/21 What changed: additional instructions   fenofibrate 160 MG tablet Take 160 mg by mouth daily.   fluticasone 50 MCG/ACT nasal spray Commonly known as: FLONASE Place 1 spray into both nostrils daily.   guaiFENesin 600 MG 12 hr tablet Commonly known as: MUCINEX Take 600 mg by mouth 2 (two) times daily as needed for cough.   Icosapent Ethyl 0.5 g Caps Commonly known as: Vascepa Take 1 g by mouth 2 (two) times daily.   lansoprazole 30 MG capsule Commonly known as: PREVACID Take 30 mg by mouth in the morning and at bedtime.  latanoprost 0.005 % ophthalmic solution Commonly known as: XALATAN Place 1 drop into the left eye at bedtime.   magnesium oxide 400 MG tablet Commonly known as: MAG-OX Take 400 mg by mouth 2 (two) times daily.   meclizine 25 MG tablet Commonly known as: ANTIVERT Take 25 mg by mouth 3 (three) times daily as needed for nausea.   metoCLOPramide 5 MG tablet Commonly known as: REGLAN Take 2.5 mg by mouth at bedtime.   montelukast 10 MG tablet Commonly known as: SINGULAIR Take 10 mg by mouth at bedtime.   NovoLOG Mix 70/30 FlexPen (70-30) 100 UNIT/ML FlexPen Generic drug: insulin aspart protamine - aspart Inject 30 Units into the skin 2 (two) times daily with a meal. What changed:  how much to take additional instructions   ondansetron 8 MG tablet Commonly known as: ZOFRAN Take 8 mg by mouth every 8 (eight) hours as needed for nausea or vomiting.   ProAir RespiClick 108 (90 Base) MCG/ACT  Aepb Generic drug: Albuterol Sulfate Inhale 2 puffs into the lungs every 6 (six) hours as needed (sob/wheezing).   simvastatin 10 MG tablet Commonly known as: ZOCOR Take 1 tablet (10 mg total) by mouth daily at 6 PM.   sodium chloride 5 % ophthalmic ointment Commonly known as: MURO 128 Place 1 application into both eyes at bedtime.        Follow-up Information     [facility] Follow up.   Specialty: Home Health Services Why: HHPT Contact information: [address] [place] [address] [phone]         Felton Clinton, MD .   Specialty: Internal Medicine Contact information: [address] [place] [address] [phone]         Elvin So C, PA-C Follow up on 08/20/2021.   Specialty: Cardiology Why: 9am Please bring in your medication bottles for med reconciliation. Contact information: [address] [place] [address] [phone]         Felton Clinton, MD Follow up in 1 week(s).   Specialty: Internal Medicine Contact information: [address] [place] [address] [phone]                Allergies  Allergen Reactions   Metoclopramide Itching    Loss of Balance; Urinary Incontinence   Nsaids     Other reaction(s): Other (See Comments) Renal Insufficiency Kidney issues    Amoxicillin-Pot Clavulanate Diarrhea and Nausea And Vomiting    Other reaction(s): Other (See Comments) Abdomen Pain Abdomen pain    Statins     Other reaction(s): Other (See Comments) Myalgias and Myositis myalgias    Allopurinol Rash   Empagliflozin     Other reaction(s): Other (See Comments) Fainting, weakness, lightheaded, falling   Tiotropium     Other reaction(s): Other (See Comments) Urinary retention    Consultations: Cardiology and general surgery   Procedures/Studies: CT Abdomen Pelvis W Contrast  Result Date: 08/09/2021 CLINICAL DATA:  Abdominal pain EXAM: CT  ABDOMEN AND PELVIS WITH CONTRAST TECHNIQUE: Multidetector CT imaging of the abdomen and pelvis was performed using the standard protocol following bolus administration of intravenous contrast. CONTRAST:  OMNIPAQUE IOHEXOL 300 MG/ML  SOLN COMPARISON:  05/01/2021 FINDINGS: Lower chest: Small linear densities in the lower lung fields may suggest scarring or subsegmental atelectasis. Small pericardial effusion is present. Hepatobiliary: No focal abnormality is seen in the liver. Surgical clips are seen in gallbladder fossa. There is no dilation of bile  ducts. Pancreas: No focal abnormality is seen. Spleen: Unremarkable. Adrenals/Urinary Tract: Adrenals are unremarkable. There is no hydronephrosis. There is 7.9 cm smooth marginated low-density lesion in the lower pole of left kidney suggesting renal cysts. There is small 2.1 cm fluid density structure in the margin of lower pole of right kidney, possibly an exophytic renal cyst. There is no hydronephrosis. There are no renal or ureteral stones. Urinary bladder is unremarkable. Stomach/Bowel: Stomach is unremarkable. There is mild dilation of distal jejunum measuring up to 3.6 cm in diameter. Proximal jejunum and distal ileum are not distended. There is interval decrease in degree of small bowel dilation in comparison with the study 05/01/2021. Appendix is not dilated. There is no significant wall thickening in colon. There is no pericolic stranding. Vascular/Lymphatic: Scattered arterial calcifications are seen. No new significant lymphadenopathy seen. Reproductive: Unremarkable. Other: There is no ascites or pneumoperitoneum. Left inguinal hernia containing fat is seen. There is subcutaneous edema in the upper abdominal wall without loculated fluid collections. Musculoskeletal: Unremarkable. IMPRESSION: There is mild dilation of few small bowel loops in the mid abdomen, possibly suggesting ileus or partial small bowel obstruction. There is interval decrease in  degree of small bowel dilation since the study done on 05/01/2021. There is no pneumoperitoneum. There is no hydronephrosis. Small pericardial effusion. Bilateral renal cysts. Other findings as described in the body of the report. Electronically Signed   By: Elmer Picker M.D.   On: 08/09/2021 09:23   CARDIAC CATHETERIZATION  Result Date: 08/12/2021 Left Heart Catheterization 08/12/21: LV: 113/6, EDP 18 mmHg.  Ao 112/50, mean 75 mmHg.  No pressure gradient across the aortic valve. LM: Large vessel, 10 to 20% stenosis. LAD: Large vessel, it is occluded after giving origin to a small D1.  Mid to distal LAD is supplied by contralateral collaterals from the RCA.  D1 is diffusely diseased and is occluded in the mid to distal segment.  Mid to distal LAD appears to be widely patent with mild disease and a good target for bypass. RI: Small vessel, no significant disease. CX: Large vessel.  Gives origin to large OM1 which is a high-grade 95% proximal stenosis followed by 20 to 30% diffuse mid segment stenosis.  Distal vessel is a good target for bypass.  There is a large OM 2 that is ulcerated and occluded in the proximal and mid segment and appears to be diffusely diseased in the proximal and mid segment.  It is a large vessel and distal vessel appears to be a good target for bypass. RCA: Dominant, large vessel.  Gives origin to 2 PDA branches, proximal PDA branch is smooth and normal.  After this distal RCA has a high-grade 90 to 95% bifurcation stenosis involving the PL branch.  Distal RCA gives excellent collaterals to the LAD. Impression: Mild increase in LVEDP.  LV gram not performed to conserve contrast.  Multivessel disease, ideally speaking would benefit from CABG.  However patient is essentially bedbound and wheelchair-bound due to obesity, peripheral neuropathy and generalized deconditioning.  Discussed with wife, patient does not appear to be a good candidate for CABG as his recovery would be extremely  complicated.  Best option is to proceed with multivessel PCI electively, in spite of two-vessel disease, EF appears to be preserved.  I would like him to recuperate from his acute renal failure.  I have discussed all the options with patient's wife who is in agreement and favors conservative therapy/PCI over surgical therapy.  50 mL contrast utilized. Patient presently  on Eliquis for PE, I have started him on aspirin along with Plavix for now.   DG Chest Portable 1 View  Result Date: 08/09/2021 CLINICAL DATA:  Chest X-Ray was ordered for NG tube placement EXAM: PORTABLE CHEST - 1 VIEW COMPARISON:  Earlier film of the same day FINDINGS: Gastric tube is been advanced into the decompressed stomach. Lungs are clear. Leftward tracheal deviation at the thoracic inlet consistent with goiter described on previous studies from 02/29/2020 and previous. Heart size upper limits normal. Aortic Atherosclerosis (ICD10-170.0). Blunting of the left lateral costophrenic angle.  No pneumothorax. Visualized bones unremarkable. IMPRESSION: Gastric tube to the decompressed stomach. Electronically Signed   By: Lucrezia Europe M.D.   On: 08/09/2021 11:52   DG Chest Portable 1 View  Result Date: 08/09/2021 CLINICAL DATA:  Chest pain. EXAM: PORTABLE CHEST 1 VIEW COMPARISON:  05/04/2021 FINDINGS: Heart size is normal. Left superior mediastinal mass is unchanged and consistent with a substernal goiter. Aortic atherosclerotic calcification noted. Low lung volumes are seen, however both lungs are clear. IMPRESSION: Low lung volumes. No active cardiopulmonary disease. Stable substernal goiter. Electronically Signed   By: Marlaine Hind M.D.   On: 08/09/2021 08:24   DG Abd Portable 2V  Result Date: 08/10/2021 CLINICAL DATA:  Small bowel obstruction. EXAM: PORTABLE ABDOMEN - 2 VIEW COMPARISON:  CT of the abdomen from yesterday FINDINGS: The enteric tube tip is at the proximal stomach with side port at the lower esophagus. Continued gas  dilated small bowel similar to scanogram from CT. Stool is seen throughout much of the colon. Small contrast filled bladder. IMPRESSION: 1. Ongoing small bowel obstruction without detected change from CT yesterday. 2. Enteric tube with tip at the stomach and side port at the lower esophagus. Electronically Signed   By: Jorje Guild M.D.   On: 08/10/2021 08:40   ECHOCARDIOGRAM COMPLETE  Result Date: 08/10/2021    ECHOCARDIOGRAM REPORT   Patient Name:   RAYVION KLINKER Date of Exam: 08/09/2021 Medical Rec #:  [id number]          Height:       73.0 in Accession #:    [id number]         Weight:       263.0 lb Date of Birth:  28-Jul-1951          BSA:          2.417 m Patient Age:    70 years           BP:           131/60 mmHg Patient Gender: M                  HR:           73 bpm. Exam Location:  Inpatient Procedure: 2D Echo, Cardiac Doppler, Color Doppler and Intracardiac            Opacification Agent Indications:     Elevated troponin  History:         Patient has prior history of Echocardiogram examinations, most                  recent 12/13/2020. CHF, Signs/Symptoms:Chest Pain; Risk                  Factors:Hypertension, Diabetes and Dyslipidemia. Long haul                  Covid.  Sonographer:     Glo Herring Sonographer#2:  Merrie Roof RDCS Referring Phys:  [id number] Jonnie Finner Diagnosing Phys: Rex Kras DO IMPRESSIONS  1. Left ventricular ejection fraction, by estimation, is 55 to 60%. The left ventricle has normal function. Left ventricular endocardial border not optimally defined to evaluate regional wall motion. Left ventricular diastolic parameters are indeterminate 2D images were obtain on 08/09/2021 LVEF 45-50% w/ RWMA along the anterolateral and inferolateral walls. Definity images were obtained 08/10/2021 which notes improvement in LVEF 55-60% without regional wall motion abnormalities.  2. Right ventricular systolic function is normal. The right ventricular size is normal. Mildly  increased right ventricular wall thickness.  3. A small pericardial effusion is present. There is no evidence of cardiac tamponade.  4. The mitral valve is normal in structure. Trivial mitral valve regurgitation. No evidence of mitral stenosis.  5. The aortic valve is tricuspid. Aortic valve regurgitation is not visualized. No aortic stenosis is present.  6. The inferior vena cava is normal in size with <50% respiratory variability, suggesting right atrial pressure of 8 mmHg. FINDINGS  Left Ventricle: 2D images were obtain on 08/09/2021 LVEF 45-50% w/ RWMA along the anterolateral and inferolateral walls. Definity images were obtained 08/10/2021 which notes LVEF 55-60% without regional wall motion abnormalities. Left ventricular ejection fraction, by estimation, is 55 to 60%. The left ventricle has normal function. Left ventricular endocardial border not optimally defined to evaluate regional wall motion. Definity contrast agent was given IV to delineate the left ventricular endocardial borders. The left ventricular internal cavity size was normal in size. There is no left ventricular hypertrophy. Left ventricular diastolic parameters are indeterminate. Right Ventricle: The right ventricular size is normal. Mildly increased right ventricular wall thickness. Right ventricular systolic function is normal. Left Atrium: Left atrial size was normal in size. Right Atrium: Right atrial size was normal in size. Pericardium: A small pericardial effusion is present. The pericardial effusion appears to contain mixed echogenic material. There is no evidence of cardiac tamponade. Mitral Valve: The mitral valve is normal in structure. Trivial mitral valve regurgitation. No evidence of mitral valve stenosis. Tricuspid Valve: The tricuspid valve is grossly normal. Tricuspid valve regurgitation is trivial. No evidence of tricuspid stenosis. Aortic Valve: The aortic valve is tricuspid. Aortic valve regurgitation is not visualized. No  aortic stenosis is present. Aortic valve mean gradient measures 3.0 mmHg. Aortic valve peak gradient measures 4.8 mmHg. Aortic valve area, by VTI measures 2.47 cm. Pulmonic Valve: The pulmonic valve was not well visualized. Pulmonic valve regurgitation is trivial. No evidence of pulmonic stenosis. Aorta: The aortic root and ascending aorta are structurally normal, with no evidence of dilitation. Venous: The inferior vena cava is normal in size with less than 50% respiratory variability, suggesting right atrial pressure of 8 mmHg. IAS/Shunts: The interatrial septum was not well visualized.  LEFT VENTRICLE PLAX 2D LVIDd:         3.50 cm     Diastology LVIDs:         2.30 cm     LV e' medial:    3.81 cm/s LV PW:         1.10 cm     LV E/e' medial:  16.4 LV IVS:        1.10 cm     LV e' lateral:   5.00 cm/s LVOT diam:     2.00 cm     LV E/e' lateral: 12.5 LV SV:         55 LV SV Index:   23 LVOT Area:  3.14 cm  LV Volumes (MOD) LV vol d, MOD A2C: 46.2 ml LV vol d, MOD A4C: 73.1 ml LV vol s, MOD A2C: 14.1 ml LV vol s, MOD A4C: 23.7 ml LV SV MOD A2C:     32.1 ml LV SV MOD A4C:     73.1 ml LV SV MOD BP:      45.2 ml RIGHT VENTRICLE             IVC RV S prime:     11.20 cm/s  IVC diam: 1.30 cm LEFT ATRIUM           Index LA diam:      4.80 cm 1.99 cm/m LA Vol (A2C): 43.1 ml 17.83 ml/m  AORTIC VALVE                    PULMONIC VALVE AV Area (Vmax):    2.66 cm     PV Vmax:       1.26 m/s AV Area (Vmean):   2.45 cm     PV Peak grad:  6.4 mmHg AV Area (VTI):     2.47 cm AV Vmax:           110.00 cm/s AV Vmean:          75.800 cm/s AV VTI:            0.224 m AV Peak Grad:      4.8 mmHg AV Mean Grad:      3.0 mmHg LVOT Vmax:         93.30 cm/s LVOT Vmean:        59.100 cm/s LVOT VTI:          0.176 m LVOT/AV VTI ratio: 0.79  AORTA Ao Root diam: 3.30 cm Ao Asc diam:  2.80 cm MITRAL VALVE MV Area (PHT): 3.07 cm    SHUNTS MV Decel Time: 247 msec    Systemic VTI:  0.18 m MV E velocity: 62.60 cm/s  Systemic Diam: 2.00 cm MV  A velocity: 95.50 cm/s MV E/A ratio:  0.66 Sunit Tolia DO Electronically signed by Rex Kras DO Signature Date/Time: 08/10/2021/1:43:56 PM    Final      Discharge Exam: Vitals:   08/13/21 0439 08/13/21 0738  BP: 129/61 (!) 143/67  Pulse: (!) 56 (!) 55  Resp: 20 16  Temp: (!) 97.5 F (36.4 C) 97.7 F (36.5 C)  SpO2: 98% 97%   Vitals:   08/12/21 2200 08/13/21 0000 08/13/21 0439 08/13/21 0738  BP: (!) 128/54 138/66 129/61 (!) 143/67  Pulse: (!) 59 (!) 58 (!) 56 (!) 55  Resp: 20 20 20 16   Temp:  97.7 F (36.5 C) (!) 97.5 F (36.4 C) 97.7 F (36.5 C)  TempSrc:  Oral Oral Oral  SpO2: 96% 97% 98% 97%  Weight:   127.4 kg   Height:        General: Pt is alert, awake, not in acute distress, obese Cardiovascular: RRR, S1/S2 +, no rubs, no gallops Respiratory: CTA bilaterally, no wheezing, no rhonchi Abdominal: Soft, NT, ND, bowel sounds + Extremities: Chronic lymphedema, no cyanosis    The results of significant diagnostics from this hospitalization (including imaging, microbiology, ancillary and laboratory) are listed below for reference.     Microbiology: Recent Results (from the past 240 hour(s))  Resp Panel by RT-PCR (Flu A&B, Covid) Nasopharyngeal Swab     Status: None   Collection Time: 08/09/21  8:14 AM   Specimen: Nasopharyngeal Swab; Nasopharyngeal(NP)  swabs in vial transport medium  Result Value Ref Range Status   SARS Coronavirus 2 by RT PCR NEGATIVE NEGATIVE Final    Comment: (NOTE) SARS-CoV-2 target nucleic acids are NOT DETECTED.  The SARS-CoV-2 RNA is generally detectable in upper respiratory specimens during the acute phase of infection. The lowest concentration of SARS-CoV-2 viral copies this assay can detect is 138 copies/mL. A negative result does not preclude SARS-Cov-2 infection and should not be used as the sole basis for treatment or other patient management decisions. A negative result may occur with  improper specimen collection/handling,  submission of specimen other than nasopharyngeal swab, presence of viral mutation(s) within the areas targeted by this assay, and inadequate number of viral copies(<138 copies/mL). A negative result must be combined with clinical observations, patient history, and epidemiological information. The expected result is Negative.  Fact Sheet for Patients:  EntrepreneurPulse.com.au  Fact Sheet for Healthcare Providers:  IncredibleEmployment.be  This test is no t yet approved or cleared by the [place] FDA and  has been authorized for detection and/or diagnosis of SARS-CoV-2 by FDA under an Emergency Use Authorization (EUA). This EUA will remain  in effect (meaning this test can be used) for the duration of the COVID-19 declaration under Section 564(b)(1) of the Act, 21 U.S.C.section 360bbb-3(b)(1), unless the authorization is terminated  or revoked sooner.       Influenza A by PCR NEGATIVE NEGATIVE Final   Influenza B by PCR NEGATIVE NEGATIVE Final    Comment: (NOTE) The Xpert Xpress SARS-CoV-2/FLU/RSV plus assay is intended as an aid in the diagnosis of influenza from Nasopharyngeal swab specimens and should not be used as a sole basis for treatment. Nasal washings and aspirates are unacceptable for Xpert Xpress SARS-CoV-2/FLU/RSV testing.  Fact Sheet for Patients: EntrepreneurPulse.com.au  Fact Sheet for Healthcare Providers: IncredibleEmployment.be  This test is not yet approved or cleared by the [place] FDA and has been authorized for detection and/or diagnosis of SARS-CoV-2 by FDA under an Emergency Use Authorization (EUA). This EUA will remain in effect (meaning this test can be used) for the duration of the COVID-19 declaration under Section 564(b)(1) of the Act, 21 U.S.C. section 360bbb-3(b)(1), unless the authorization is terminated or revoked.  Performed at [facility], [address] [place], [place] [address]      Labs: BNP (last 3 results) Recent Labs    08/09/21 1435  BNP 138.4*    Basic Metabolic Panel: Recent Labs  Lab 08/09/21 0801 08/11/21 0327 08/11/21 1220 08/12/21 0209 08/13/21 0354  NA 136 135 135 135 133*  K 4.3 4.0 4.5 3.9 4.0  CL 100 101 101 104 106  CO2 27 25 26 25  20*  GLUCOSE 213* 137* 185* 147* 193*  BUN 33* 34* 32* 29* 22  CREATININE 1.28* 1.58* 1.63* 1.41* 1.25*  CALCIUM 9.3 9.0 8.9 8.7* 8.8*    Liver Function Tests: Recent Labs  Lab 08/09/21 0801  AST 23  ALT 21  ALKPHOS 42  BILITOT 0.4  PROT 7.1  ALBUMIN 3.3*    Recent Labs  Lab 08/09/21 0801  LIPASE 29    No results for input(s): AMMONIA in the last 168 hours. CBC: Recent Labs  Lab 08/09/21 0801 08/10/21 0204 08/12/21 0209  WBC 7.1 10.7* 6.6  NEUTROABS 4.3  --   --   HGB 13.4 13.4 10.9*  HCT 41.0 41.9 33.5*  MCV 94.5 94.6 93.6  PLT 271 292 210    Cardiac Enzymes: No results  for input(s): CKTOTAL, CKMB, CKMBINDEX, TROPONINI in the last 168 hours. BNP: Invalid input(s): POCBNP CBG: Recent Labs  Lab 08/12/21 0610 08/12/21 1118 08/12/21 1634 08/12/21 2228 08/13/21 0547  GLUCAP 151* 195* 183* 214* 206*    D-Dimer No results for input(s): DDIMER in the last 72 hours. Hgb A1c No results for input(s): HGBA1C in the last 72 hours. Lipid Profile No results for input(s): CHOL, HDL, LDLCALC, TRIG, CHOLHDL, LDLDIRECT in the last 72 hours. Thyroid function studies No results for input(s): TSH, T4TOTAL, T3FREE, THYROIDAB in the last 72 hours.  Invalid input(s): FREET3 Anemia work up No results for input(s): VITAMINB12, FOLATE, FERRITIN, TIBC, IRON, RETICCTPCT in the last 72 hours. Urinalysis    Component Value Date/Time   COLORURINE YELLOW 08/09/2021 0912   APPEARANCEUR CLEAR 08/09/2021 0912   LABSPEC 1.020 08/09/2021 0912   PHURINE 5.0 08/09/2021 0912   GLUCOSEU NEGATIVE 08/09/2021 0912   HGBUR NEGATIVE 08/09/2021 0912    BILIRUBINUR NEGATIVE 08/09/2021 0912   KETONESUR NEGATIVE 08/09/2021 0912   PROTEINUR NEGATIVE 08/09/2021 0912   NITRITE NEGATIVE 08/09/2021 0912   LEUKOCYTESUR NEGATIVE 08/09/2021 0912   Sepsis Labs Invalid input(s): PROCALCITONIN,  WBC,  LACTICIDVEN Microbiology Recent Results (from the past 240 hour(s))  Resp Panel by RT-PCR (Flu A&B, Covid) Nasopharyngeal Swab     Status: None   Collection Time: 08/09/21  8:14 AM   Specimen: Nasopharyngeal Swab; Nasopharyngeal(NP) swabs in vial transport medium  Result Value Ref Range Status   SARS Coronavirus 2 by RT PCR NEGATIVE NEGATIVE Final    Comment: (NOTE) SARS-CoV-2 target nucleic acids are NOT DETECTED.  The SARS-CoV-2 RNA is generally detectable in upper respiratory specimens during the acute phase of infection. The lowest concentration of SARS-CoV-2 viral copies this assay can detect is 138 copies/mL. A negative result does not preclude SARS-Cov-2 infection and should not be used as the sole basis for treatment or other patient management decisions. A negative result may occur with  improper specimen collection/handling, submission of specimen other than nasopharyngeal swab, presence of viral mutation(s) within the areas targeted by this assay, and inadequate number of viral copies(<138 copies/mL). A negative result must be combined with clinical observations, patient history, and epidemiological information. The expected result is Negative.  Fact Sheet for Patients:  EntrepreneurPulse.com.au  Fact Sheet for Healthcare Providers:  IncredibleEmployment.be  This test is no t yet approved or cleared by the [place] FDA and  has been authorized for detection and/or diagnosis of SARS-CoV-2 by FDA under an Emergency Use Authorization (EUA). This EUA will remain  in effect (meaning this test can be used) for the duration of the COVID-19 declaration under Section 564(b)(1) of the Act,  21 U.S.C.section 360bbb-3(b)(1), unless the authorization is terminated  or revoked sooner.       Influenza A by PCR NEGATIVE NEGATIVE Final   Influenza B by PCR NEGATIVE NEGATIVE Final    Comment: (NOTE) The Xpert Xpress SARS-CoV-2/FLU/RSV plus assay is intended as an aid in the diagnosis of influenza from Nasopharyngeal swab specimens and should not be used as a sole basis for treatment. Nasal washings and aspirates are unacceptable for Xpert Xpress SARS-CoV-2/FLU/RSV testing.  Fact Sheet for Patients: EntrepreneurPulse.com.au  Fact Sheet for Healthcare Providers: IncredibleEmployment.be  This test is not yet approved or cleared by the [place] FDA and has been authorized for detection and/or diagnosis of SARS-CoV-2 by FDA under an Emergency Use Authorization (EUA). This EUA will remain in effect (meaning this test can be used) for the  duration of the COVID-19 declaration under Section 564(b)(1) of the Act, 21 U.S.C. section 360bbb-3(b)(1), unless the authorization is terminated or revoked.  Performed at [facility], [address] [place], [place] [address]      Time coordinating discharge: Over 30 minutes  SIGNED:   Darliss Cheney, MD  [facility] 08/13/2021, 10:45 AM  If 7PM-7AM, please contact night-coverage [email or web address]

## 2021-08-13 NOTE — TOC Transition Note (Signed)
Transition of Care [facility]) - CM/SW Discharge Note   Patient Details  Name: Randy Liu MRN: [id number] Date of Birth: [birth year]  Transition of Care [facility]) CM/SW Contact:  Leone Haven, RN Phone Number: 08/13/2021, 9:57 AM   Clinical Narrative:    NCM spoke with wife at bedside, she states he will need transport home and he is active with [facility] for HHPT and they would like to continue with [facility].  NCM confirmed with Marylene Land with [facility] and notified he is for dc today. NCM call PTAR to scheduled transport, address confirmed.      Final next level of care: Home w Home Health Services Barriers to Discharge: No Barriers Identified   Patient Goals and CMS Choice Patient states their goals for this hospitalization and ongoing recovery are:: return home CMS Medicare.gov Compare Post Acute Care list provided to:: Patient Represenative (must comment) Choice offered to / list presented to : Spouse  Discharge Placement                       Discharge Plan and Services   Discharge Planning Services: CM Consult Post Acute Care Choice: Resumption of Svcs/PTA Provider            DME Agency: NA       HH Arranged: PT HH Agency: [facility] Date [facility] Agency Contacted: 08/13/21 Time HH Agency Contacted: [phone] Representative spoke with at [facility] Agency: Marylene Land  Social Determinants of Health (SDOH) Interventions     Readmission Risk Interventions Readmission Risk Prevention Plan 08/13/2021  Transportation Screening Complete  PCP or Specialist Appt within 3-5 Days Complete  HRI or Home Care Consult Complete  Social Work Consult for Recovery Care Planning/Counseling Complete  Palliative Care Screening Not Applicable  Medication Review Oceanographer) Complete  Some recent data might be hidden

## 2021-08-13 NOTE — TOC Progression Note (Signed)
Transition of Care [facility]) - Progression Note    Patient Details  Name: Randy Liu MRN: [id number] Date of Birth: 1950/12/17  Transition of Care [facility]) CM/SW Contact  Leone Haven, RN Phone Number: 08/13/2021, 8:27 AM  Clinical Narrative:    from home with spouse, NSTEMI, cath yesterday, MV dz, TCT to eval , patient has opted for PCI.  He is w/chair and bed bound mostly. TOC will continue to follow for dc needs         Expected Discharge Plan and Services                                                 Social Determinants of Health (SDOH) Interventions    Readmission Risk Interventions No flowsheet data found.

## 2021-08-13 NOTE — TOC Initial Note (Signed)
Transition of Care [facility]) - Initial/Assessment Note    Patient Details  Name: Randy Liu MRN: [id number] Date of Birth: Feb 11, 1951  Transition of Care [facility]) CM/SW Contact:    Leone Haven, RN Phone Number: 08/13/2021, 9:55 AM  Clinical Narrative:                 NCM spoke with wife at bedside, she states he will need transport home and he is active with [facility] for HHPT and they would like to continue with [facility].  NCM confirmed with Marylene Land with [facility] and notified he is for dc today. NCM call PTAR to scheduled transport, address confirmed.   Expected Discharge Plan: Home w Home Health Services Barriers to Discharge: No Barriers Identified   Patient Goals and CMS Choice Patient states their goals for this hospitalization and ongoing recovery are:: return home CMS Medicare.gov Compare Post Acute Care list provided to:: Patient Represenative (must comment) Choice offered to / list presented to : Spouse  Expected Discharge Plan and Services Expected Discharge Plan: Home w Home Health Services   Discharge Planning Services: CM Consult Post Acute Care Choice: Resumption of Svcs/PTA Provider Living arrangements for the past 2 months: Single Family Home Expected Discharge Date: 08/13/21                 DME Agency: NA       HH Arranged: PT HH Agency: [facility] Date [facility] Agency Contacted: 08/13/21 Time HH Agency Contacted: [phone] Representative spoke with at [facility] Agency: Marylene Land  Prior Living Arrangements/Services Living arrangements for the past 2 months: Single Family Home Lives with:: Spouse Patient language and need for interpreter reviewed:: Yes Do you feel safe going back to the place where you live?: Yes      Need for Family Participation in Patient Care: Yes (Comment) Care giver support system in place?: Yes (comment) Current home services: Home PT Criminal Activity/Legal Involvement Pertinent to Current Situation/Hospitalization: No -  Comment as needed  Activities of Daily Living Home Assistive Devices/Equipment: None ADL Screening (condition at time of admission) Patient's cognitive ability adequate to safely complete daily activities?: Yes Is the patient deaf or have difficulty hearing?: Yes Does the patient have difficulty seeing, even when wearing glasses/contacts?: No Does the patient have difficulty concentrating, remembering, or making decisions?: No Patient able to express need for assistance with ADLs?: Yes Does the patient have difficulty dressing or bathing?: Yes Independently performs ADLs?: No Communication: Independent Dressing (OT): Needs assistance Is this a change from baseline?: Pre-admission baseline Grooming: Needs assistance Is this a change from baseline?: Pre-admission baseline Feeding: Independent Bathing: Needs assistance Is this a change from baseline?: Pre-admission baseline Toileting: Needs assistance Is this a change from baseline?: Pre-admission baseline In/Out Bed: Needs assistance Is this a change from baseline?: Pre-admission baseline Walks in Home: Needs assistance Is this a change from baseline?: Pre-admission baseline Does the patient have difficulty walking or climbing stairs?: Yes Weakness of Legs: Both Weakness of Arms/Hands: Both  Permission Sought/Granted                  Emotional Assessment Appearance:: Appears stated age     Orientation: : Oriented to Self, Oriented to Place, Oriented to  Time, Oriented to Situation Alcohol / Substance Use: Not Applicable Psych Involvement: No (comment)  Admission diagnosis:  Ileus (HCC) [K56.7] Partial intestinal obstruction, unspecified cause (HCC) [K56.600] NSTEMI (non-ST elevated myocardial infarction) [facility]) [I21.4] Patient Active Problem List   Diagnosis Date Noted   Coronary  artery disease involving native coronary artery of native heart with unstable angina pectoris ([place])    AKI (acute kidney injury) ([place])     Pure hypercholesterolemia    NSTEMI (non-ST elevated myocardial infarction) ([place]) 08/10/2021   Elevated troponin    Ileus ([place]) 08/09/2021   Non-ST elevation (NSTEMI) myocardial infarction [facility])    Type 2 diabetes mellitus with hyperlipidemia (HCC)    Hypertriglyceridemia    Intractable abdominal pain    Nausea and vomiting    History of pulmonary embolism    Statin intolerance    Hematochezia 05/07/2021   Small bowel obstruction ([place]) 05/01/2021   SBO (small bowel obstruction) ([place]) 05/01/2021   Yeast infection 12/15/2020   Elevated LFTs 12/15/2020   Acute hypoxemic respiratory failure ([place]) 12/13/2020   Hyponatremia 12/13/2020   Asthma 12/13/2020   Diabetes ([place]) 12/13/2020   Acute cholangitis 12/12/2020   PCP:  [name], MD Pharmacy:   [facility] [place] - [address] [place] [address] Phone: [phone] Fax: [phone]     Social Determinants of Health (SDOH) Interventions    Readmission Risk Interventions Readmission Risk Prevention Plan 08/13/2021  Transportation Screening Complete  PCP or Specialist Appt within 3-5 Days Complete  HRI or [place] Complete  Social Work Consult for [place] Planning/Counseling Complete  Palliative Care Screening Not Applicable  Medication Review Press photographer) Complete  Some recent data might be hidden

## 2021-08-13 NOTE — Progress Notes (Signed)
Progress Note  Patient Name: Randy Liu Date of Encounter: 08/13/2021  Attending physician: Hughie Closs, MD Primary care provider: Felton Clinton, MD  Subjective: Randy Liu is a 70 y.o. male who was seen and examined at bedside  No chest pain or shortness of breath Wife present at bedside. Reviewed the findings of the left heart catheterization with patient and wife Case discussed and reviewed with his nurse.  Objective: Vital Signs in the last 24 hours: Temp:  [97.5 F (36.4 C)-98.6 F (37 C)] 97.7 F (36.5 C) (11/30 0738) Pulse Rate:  [53-67] 55 (11/30 0738) Resp:  [12-20] 16 (11/30 0738) BP: (125-150)/(54-78) 143/67 (11/30 0738) SpO2:  [95 %-99 %] 97 % (11/30 0738) Weight:  [127.4 kg] 127.4 kg (11/30 0439)  Intake/Output:  Intake/Output Summary (Last 24 hours) at 08/13/2021 1013 Last data filed at 08/13/2021 0647 Gross per 24 hour  Intake 1176.07 ml  Output 1100 ml  Net 76.07 ml    Net IO Since Admission: 2,599.67 mL [08/13/21 1013]  Weights:  Filed Weights   08/11/21 0300 08/12/21 0406 08/13/21 0439  Weight: 124.5 kg 128.3 kg 127.4 kg    Telemetry: Personally reviewed.  Sinus bradycardia.   Physical examination: PHYSICAL EXAM: Vitals with BMI 08/13/2021 08/13/2021 08/13/2021  Height - - -  Weight - 280 lbs 14 oz -  BMI - 37.06 -  Systolic 143 129 419  Diastolic 67 61 66  Pulse 55 56 58    CONSTITUTIONAL: Appears older than stated age, hemodynamically stable, well-developed and well-nourished. No acute distress.  SKIN: Skin is warm and dry. No rash noted. No cyanosis. No pallor. No jaundice HEAD: Normocephalic and atraumatic.  EYES: No scleral icterus MOUTH/THROAT: Moist oral membranes.  NECK: No JVD present. No thyromegaly noted. No carotid bruits  LYMPHATIC: No visible cervical adenopathy.  CHEST Normal respiratory effort. No intercostal retractions  LUNGS: Clear to auscultation bilaterally.  No stridor. No wheezes. No rales.   CARDIOVASCULAR: Regular, bradycardic, positive S1-S2, no murmurs rubs or gallops appreciated ABDOMINAL: Obese, soft, nontender, nondistended, bowel sounds in all 4 quadrants, no ascites.   EXTREMITIES: Trace bilateral pitting edema, warm to touch.  HEMATOLOGIC: No significant bruising NEUROLOGIC: Oriented to person, place, and time. Nonfocal. Normal muscle tone.  PSYCHIATRIC: Normal mood and affect. Normal behavior. Cooperative  Lab Results: Hematology Recent Labs  Lab 08/09/21 0801 08/10/21 0204 08/12/21 0209  WBC 7.1 10.7* 6.6  RBC 4.34 4.43 3.58*  HGB 13.4 13.4 10.9*  HCT 41.0 41.9 33.5*  MCV 94.5 94.6 93.6  MCH 30.9 30.2 30.4  MCHC 32.7 32.0 32.5  RDW 15.4 15.8* 15.3  PLT 271 292 210    Chemistry Recent Labs  Lab 08/09/21 0801 08/11/21 0327 08/11/21 1220 08/12/21 0209 08/13/21 0354  NA 136   < > 135 135 133*  K 4.3   < > 4.5 3.9 4.0  CL 100   < > 101 104 106  CO2 27   < > 26 25 20*  GLUCOSE 213*   < > 185* 147* 193*  BUN 33*   < > 32* 29* 22  CREATININE 1.28*   < > 1.63* 1.41* 1.25*  CALCIUM 9.3   < > 8.9 8.7* 8.8*  PROT 7.1  --   --   --   --   ALBUMIN 3.3*  --   --   --   --   AST 23  --   --   --   --   ALT 21  --   --   --   --  ALKPHOS 42  --   --   --   --   BILITOT 0.4  --   --   --   --   GFRNONAA 59*   < > 44* 53* >60  ANIONGAP 9   < > 8 6 7    < > = values in this interval not displayed.     Cardiac Enzymes: Cardiac Panel (last 3 results) Recent Labs    08/10/21 1038 08/10/21 1330  TROPONINIHS 723* 731*    BNP (last 3 results) Recent Labs    08/09/21 1435  BNP 138.4*    ProBNP (last 3 results) No results for input(s): PROBNP in the last 8760 hours.   DDimer No results for input(s): DDIMER in the last 168 hours.   Hemoglobin A1c:  Lab Results  Component Value Date   HGBA1C 8.0 (H) 08/09/2021   MPG 183 08/09/2021    TSH No results for input(s): TSH in the last 8760 hours.  Lipid Panel     Component Value Date/Time    CHOL 223 (H) 08/10/2021 0204   TRIG 313 (H) 08/10/2021 0204   HDL 24 (L) 08/10/2021 0204   CHOLHDL 9.3 08/10/2021 0204   VLDL 63 (H) 08/10/2021 0204   LDLCALC 136 (H) 08/10/2021 0204    Imaging: CARDIAC CATHETERIZATION  Result Date: 08/12/2021 Left Heart Catheterization 08/12/21: LV: 113/6, EDP 18 mmHg.  Ao 112/50, mean 75 mmHg.  No pressure gradient across the aortic valve. LM: Large vessel, 10 to 20% stenosis. LAD: Large vessel, it is occluded after giving origin to a small D1.  Mid to distal LAD is supplied by contralateral collaterals from the RCA.  D1 is diffusely diseased and is occluded in the mid to distal segment.  Mid to distal LAD appears to be widely patent with mild disease and a good target for bypass. RI: Small vessel, no significant disease. CX: Large vessel.  Gives origin to large OM1 which is a high-grade 95% proximal stenosis followed by 20 to 30% diffuse mid segment stenosis.  Distal vessel is a good target for bypass.  There is a large OM 2 that is ulcerated and occluded in the proximal and mid segment and appears to be diffusely diseased in the proximal and mid segment.  It is a large vessel and distal vessel appears to be a good target for bypass. RCA: Dominant, large vessel.  Gives origin to 2 PDA branches, proximal PDA branch is smooth and normal.  After this distal RCA has a high-grade 90 to 95% bifurcation stenosis involving the PL branch.  Distal RCA gives excellent collaterals to the LAD. Impression: Mild increase in LVEDP.  LV gram not performed to conserve contrast.  Multivessel disease, ideally speaking would benefit from CABG.  However patient is essentially bedbound and wheelchair-bound due to obesity, peripheral neuropathy and generalized deconditioning.  Discussed with wife, patient does not appear to be a good candidate for CABG as his recovery would be extremely complicated.  Best option is to proceed with multivessel PCI electively, in spite of two-vessel disease,  EF appears to be preserved.  I would like him to recuperate from his acute renal failure.  I have discussed all the options with patient's wife who is in agreement and favors conservative therapy/PCI over surgical therapy.  50 mL contrast utilized. Patient presently on Eliquis for PE, I have started him on aspirin along with Plavix for now.    CARDIAC DATABASE: EKG: 08/09/2021: NSR, 66 bpm, left axis deviation, poor R  wave progression, without underlying injury pattern.   08/09/2021: NSR, 67 bpm, ST depressions in the high lateral leads suggestive of lateral ischemia.  08/10/2021: NSR, 73bpm, subtle ST depression high lateral and lateral leads, PRWP, without injury pattern.    Echocardiogram: 08/09/2021: 1. Left ventricular ejection fraction, by estimation, is 55 to 60%. The left ventricle has normal function. Left ventricular endocardial border not optimally defined to evaluate regional wall motion. Left ventricular diastolic parameters are indeterminate. 2D images were obtain on 08/09/2021 LVEF 45-50% w/ RWMA  along the anterolateral and inferolateral walls. Definity images were  obtained 08/10/2021 which notes improvement in LVEF 55-60% without  regional wall motion abnormalities.   2. Right ventricular systolic function is normal. The right ventricular  size is normal. Mildly increased right ventricular wall thickness.   3. A small pericardial effusion is present. There is no evidence of  cardiac tamponade.   4. The mitral valve is normal in structure. Trivial mitral valve  regurgitation. No evidence of mitral stenosis.   5. The aortic valve is tricuspid. Aortic valve regurgitation is not  visualized. No aortic stenosis is present.   6. The inferior vena cava is normal in size with <50% respiratory  variability, suggesting right atrial pressure of 8 mmHg.    Stress Testing:  None   Heart Catheterization: Left Heart Catheterization 08/12/21:  LV: 113/6, EDP 18 mmHg.  Ao 112/50, mean  75 mmHg.  No pressure gradient across the aortic valve. LM: Large vessel, 10 to 20% stenosis. LAD: Large vessel, it is occluded after giving origin to a small D1.  Mid to distal LAD is supplied by contralateral collaterals from the RCA.  D1 is diffusely diseased and is occluded in the mid to distal segment.  Mid to distal LAD appears to be widely patent with mild disease and a good target for bypass. RI: Small vessel, no significant disease. CX: Large vessel.  Gives origin to large OM1 which is a high-grade 95% proximal stenosis followed by 20 to 30% diffuse mid segment stenosis.  Distal vessel is a good target for bypass.  There is a large OM 2 that is ulcerated and occluded in the proximal and mid segment and appears to be diffusely diseased in the proximal and mid segment.  It is a large vessel and distal vessel appears to be a good target for bypass. RCA: Dominant, large vessel.  Gives origin to 2 PDA branches, proximal PDA branch is smooth and normal.  After this distal RCA has a high-grade 90 to 95% bifurcation stenosis involving the PL branch.  Distal RCA gives excellent collaterals to the LAD.   Impression: Mild increase in LVEDP.  LV gram not performed to conserve contrast.  Multivessel disease, ideally speaking would benefit from CABG.  However patient is essentially bedbound and wheelchair-bound due to obesity, peripheral neuropathy and generalized deconditioning.  Discussed with wife, patient does not appear to be a good candidate for CABG as his recovery would be extremely complicated.  Best option is to proceed with multivessel PCI electively, in spite of two-vessel disease, EF appears to be preserved.  I would like him to recuperate from his acute renal failure.  I have discussed all the options with patient's wife who is in agreement and favors conservative therapy/PCI over surgical therapy.  50 mL contrast utilized. Patient presently on Eliquis for PE, I have started him on aspirin along  with Plavix for now.  Scheduled Meds:  apixaban  5 mg Oral BID   aspirin  EC  81 mg Oral Daily   carvedilol  12.5 mg Oral BID   clopidogrel  75 mg Oral Daily   ezetimibe  10 mg Oral Daily   fenofibrate  160 mg Oral Daily   fluticasone  1 spray Each Nare Daily   icosapent Ethyl  1 g Oral BID   insulin aspart  0-15 Units Subcutaneous TID WC   insulin aspart  0-5 Units Subcutaneous QHS   insulin glargine-yfgn  10 Units Subcutaneous Daily   latanoprost  1 drop Left Eye QHS   metoCLOPramide  2.5 mg Oral QHS   montelukast  10 mg Oral QHS   pantoprazole  40 mg Oral Daily   simvastatin  10 mg Oral q1800   sodium chloride  1 application Both Eyes QHS   sodium chloride flush  3 mL Intravenous Q12H   sodium chloride flush  3 mL Intravenous Q12H    Continuous Infusions:  sodium chloride      PRN Meds: sodium chloride, acetaminophen, bisacodyl, dextromethorphan, meclizine, melatonin, ondansetron (ZOFRAN) IV, phenol, polyvinyl alcohol, sodium chloride flush   IMPRESSION & RECOMMENDATIONS: Randy Liu is a 70 y.o. 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  Impression:  NSTEMI: Epigastric discomfort - not entirely cardiac; but hx limited to due poor functional capacity at baseline.  Dynamic EKG changes, w/ uptrend of hs troponin.  Left heart catheterization results reviewed with the patient's wife. Shared decision between interventional cardiology and the patient's wife is to stage percutaneous interventions given his underlying obstructive CAD, see cath report for additional details.  Since the patient is currently asymptomatic, has collateral  circulation, and given his recent GI issues and renal insufficiency, the plan is to do it as outpatient. Continue aspirin, Zetia, simvastatin, beta-blockers.   Echo - personally reviewed. LVEF and RWMA have improved since yday night once  A1c 7.9 LDL 136mg /dL; spoke to wife and patient they are will to try Simvastatin.  Will need to start Maud as outpatient given his CAD. TOC visit 08/20/2021 at 9 AM at [facility] cardiovascular.  Established coronary artery disease without angina pectoris: See above  Acute kidney injury: Resolved Serum creatinine back to baseline -as it was on admission. Further management per primary team   Hyperlipidemia: Prior to this hospitalization was not on statin therapy; due statin intolerance.  According to his wife he has been on Crestor, Lipitor, and Pravastatin which has resulted in myalgias. Given the NSTEMI both are agreeable to try Simvastatin as an alternative and consider PCSK9 as outpatient.  Fasting lipid profile - reviewed TAG and LDL not well controlled.  Continue Zetia 10 mg p.o. daily.  Hypertriglyceridemia: Continue fenofibrate & Vascepa    Insulin-dependent diabetes mellitus type 2 with complications of neuropathy & hyperglycemia. Currently managed per primary team.   Intractable abdominal pain, nausea, vomiting:  Per primary team.   Hx of PE: on Eliquis at home.   Will defer management of his other co-morbid conditions and active problem list to primary team for now.   Plan of care discussed w/ nursing staff and primary team after morning rounds.   Patient's questions and concerns were addressed to his satisfaction. He voices understanding of the instructions provided during this encounter.   This note  was created using a voice recognition software as a result there may be grammatical errors inadvertently enclosed that do not reflect the nature of this encounter. Every attempt is made to correct such errors.  Total time spent: 35  minutes.   Mechele Claude [facility]  Pager: [phone] Office: [phone] 08/13/2021, 10:13 AM

## 2021-08-20 ENCOUNTER — Other Ambulatory Visit: Payer: Self-pay

## 2021-08-20 ENCOUNTER — Encounter: Payer: Self-pay | Admitting: Student

## 2021-08-20 ENCOUNTER — Ambulatory Visit: Payer: Medicare Other | Admitting: Student

## 2021-08-20 VITALS — BP 142/68 | HR 65 | Ht 73.0 in | Wt 280.0 lb

## 2021-08-20 DIAGNOSIS — I10 Essential (primary) hypertension: Secondary | ICD-10-CM

## 2021-08-20 DIAGNOSIS — I252 Old myocardial infarction: Secondary | ICD-10-CM

## 2021-08-20 DIAGNOSIS — I2510 Atherosclerotic heart disease of native coronary artery without angina pectoris: Secondary | ICD-10-CM

## 2021-08-20 DIAGNOSIS — E1169 Type 2 diabetes mellitus with other specified complication: Secondary | ICD-10-CM

## 2021-08-20 DIAGNOSIS — E785 Hyperlipidemia, unspecified: Secondary | ICD-10-CM

## 2021-08-20 DIAGNOSIS — E7800 Pure hypercholesterolemia, unspecified: Secondary | ICD-10-CM

## 2021-08-20 DIAGNOSIS — Z86711 Personal history of pulmonary embolism: Secondary | ICD-10-CM

## 2021-08-20 MED ORDER — REPATHA SURECLICK 140 MG/ML ~~LOC~~ SOAJ: 1.0000 mL | SUBCUTANEOUS | 3 refills | Status: DC

## 2021-08-20 MED ORDER — LOSARTAN POTASSIUM 25 MG PO TABS: 25.0000 mg | ORAL_TABLET | Freq: Every day | ORAL | 3 refills | Status: DC

## 2021-08-20 NOTE — Progress Notes (Signed)
Primary Physician/Referring:  [name], MD  Patient ID: Randy Liu, male    DOB: 09-30-1950, 70 y.o.   MRN: [id number]  No chief complaint on file.  HPI:    Randy Liu  is a 70 y.o. Insulin-dependent diabetes mellitus type 2, chronic HFpEF, hyperlipidemia (previous statin intolerance), hypertriglyceridemia, GERD, history of PE on oral anticoagulation, predominantly bedbound due to peripheral neuropathy due to diabetes and COVID-19 infection, obesity due to excess calories.  Patient admitted 08/09/2021 - 08/13/2021 when he presented with nausea and vomiting secondary to partial small bowel obstruction.  During hospitalization patient developed chest pain and troponin became elevated, he therefore underwent cardiac catheterization 08/12/2021 for NSTEMI.  Patient was found to have multivessel disease, shared decision at the time between interventional cardiology and the patient's wife was that patient was not a candidate for CABG given poor functional status and multiple medical comorbidities. Rather decided to consider outpatient staged PCI.   Patient was subsequently discharged with low-dose aspirin, carvedilol, Vascepa, simvastatin Plavix as well as Eliquis given history of PE.  He presents today for follow-up.  Prior to most recent hospitalization patient was primarily wheelchair-bound, however he had difficulty with transfers into the wheelchair and was mostly bedbound.  Since discharge he has been unable to tolerate transferring to the wheelchair, therefore has been strictly bedbound.  Patient is working with physical therapy to improve strength, however he is struggling due to deconditioning and peripheral neuropathy.  Patient is essentially asymptomatic from a cardiovascular standpoint.  Denies chest pain, palpitations, syncope, near syncope.  Denies orthopnea, PND.  Patient does have chronic bilateral leg edema which is stable.  He also has dyspnea on exertion, which is  also chronic and stable.  Patient's mobility is very limited and therefore he presents today in a stretcher with transport team for the office visit.  Past Medical History:  Diagnosis Date   Asthma    CHF (congestive heart failure) ([place])    Diabetes mellitus without complication ([place])    Hypertension    Past Surgical History:  Procedure Laterality Date   BIOPSY  12/14/2020   Procedure: BIOPSY;  Surgeon: Jackquline Denmark, MD;  Location: [facility];  Service: Endoscopy;;   CHOLECYSTECTOMY N/A 12/17/2020   Procedure: LAPAROSCOPIC CHOLECYSTECTOMY, LIVER BIOPSY, PRIMARY UMBILICAL HERNIA REPAIR;  Surgeon: Armandina Gemma, MD;  Location: [facility];  Service: General;  Laterality: N/A;   COLONOSCOPY WITH PROPOFOL N/A 05/08/2021   Procedure: COLONOSCOPY WITH PROPOFOL;  Surgeon: Carol Ada, MD;  Location: [facility];  Service: Endoscopy;  Laterality: N/A;   ENDOSCOPIC RETROGRADE CHOLANGIOPANCREATOGRAPHY (ERCP) WITH PROPOFOL N/A 12/14/2020   Procedure: ENDOSCOPIC RETROGRADE CHOLANGIOPANCREATOGRAPHY (ERCP) WITH PROPOFOL;  Surgeon: Jackquline Denmark, MD;  Location: [facility];  Service: Endoscopy;  Laterality: N/A;   LEFT HEART CATH AND CORONARY ANGIOGRAPHY N/A 08/12/2021   Procedure: LEFT HEART CATH AND CORONARY ANGIOGRAPHY;  Surgeon: Adrian Prows, MD;  Location: [facility];  Service: Cardiovascular;  Laterality: N/A;   POLYPECTOMY  05/08/2021   Procedure: POLYPECTOMY;  Surgeon: Carol Ada, MD;  Location: [facility];  Service: Endoscopy;;   REMOVAL OF STONES  12/14/2020   Procedure: REMOVAL OF STONES;  Surgeon: Jackquline Denmark, MD;  Location: [facility];  Service: Endoscopy;;   SPHINCTEROTOMY  12/14/2020   Procedure: Joan Mayans;  Surgeon: Jackquline Denmark, MD;  Location: [facility];  Service: Endoscopy;;   No family history on file.  Social History   Tobacco Use   Smoking status: Former   Smokeless tobacco: Never  Substance Use Topics  Alcohol use: Not Currently   Marital Status: Married    ROS  Review of Systems  Constitutional: Negative for malaise/fatigue and weight gain.  Cardiovascular:  Positive for dyspnea on exertion (chronic, stable) and leg swelling (chronic, stable). Negative for chest pain, claudication, near-syncope, orthopnea, palpitations, paroxysmal nocturnal dyspnea and syncope.  Neurological:  Negative for dizziness.   Objective  Blood pressure (!) 142/68, pulse 65, height 6\' 1"  (1.854 m), weight 280 lb (127 kg), SpO2 96 %.  Vitals with BMI 08/20/2021 08/13/2021 08/13/2021  Height 6\' 1"  - -  Weight 280 lbs - -  BMI XX123456 - -  Systolic [id number] A999333  Diastolic 68 62 67  Pulse 65 56 55      Physical Exam Vitals reviewed.  Constitutional:      Appearance: He is obese.     Comments: Bedbound, presents in stretcher.  Neck:     Vascular: No carotid bruit or JVD.  Cardiovascular:     Rate and Rhythm: Normal rate and regular rhythm.     Pulses: Intact distal pulses.     Heart sounds: S1 normal and S2 normal. No murmur heard.   No gallop.  Pulmonary:     Effort: Pulmonary effort is normal. No respiratory distress.     Breath sounds: No wheezing, rhonchi or rales.  Musculoskeletal:     Right lower leg: Edema (trace pitting) present.     Left lower leg: Edema (rrace pitting) present.  Skin:    General: Skin is warm and dry.  Neurological:     Mental Status: He is alert.   Laboratory examination:   Recent Labs    08/11/21 1220 08/12/21 0209 08/13/21 0354  NA 135 135 133*  K 4.5 3.9 4.0  CL 101 104 106  CO2 26 25 20*  GLUCOSE 185* 147* 193*  BUN 32* 29* 22  CREATININE 1.63* 1.41* 1.25*  CALCIUM 8.9 8.7* 8.8*  GFRNONAA 44* 53* >60   estimated creatinine clearance is 74.6 mL/min (A) (by C-G formula based on SCr of 1.25 mg/dL (H)).  CMP Latest Ref Rng & Units 08/13/2021 08/12/2021 08/11/2021  Glucose 70 - 99 mg/dL 193(H) 147(H) 185(H)  BUN 8 - 23 mg/dL 22 29(H) 32(H)  Creatinine 0.61 - 1.24 mg/dL 1.25(H) 1.41(H) 1.63(H)  Sodium 135 - 145  mmol/L 133(L) 135 135  Potassium 3.5 - 5.1 mmol/L 4.0 3.9 4.5  Chloride 98 - 111 mmol/L 106 104 101  CO2 22 - 32 mmol/L 20(L) 25 26  Calcium 8.9 - 10.3 mg/dL 8.8(L) 8.7(L) 8.9  Total Protein 6.5 - 8.1 g/dL - - -  Total Bilirubin 0.3 - 1.2 mg/dL - - -  Alkaline Phos 38 - 126 U/L - - -  AST 15 - 41 U/L - - -  ALT 0 - 44 U/L - - -   CBC Latest Ref Rng & Units 08/12/2021 08/10/2021 08/09/2021  WBC 4.0 - 10.5 K/uL 6.6 10.7(H) 7.1  Hemoglobin 13.0 - 17.0 g/dL 10.9(L) 13.4 13.4  Hematocrit 39.0 - 52.0 % 33.5(L) 41.9 41.0  Platelets 150 - 400 K/uL 210 292 271    Lipid Panel Recent Labs    08/10/21 0204  CHOL 223*  TRIG 313*  LDLCALC 136*  VLDL 63*  HDL 24*  CHOLHDL 9.3    HEMOGLOBIN A1C Lab Results  Component Value Date   HGBA1C 8.0 (H) 08/09/2021   MPG 183 08/09/2021   TSH No results for input(s): TSH in the last 8760 hours.  External labs:  08/19/2021: Sodium 138, potassium 4.8, BUN 27, creatinine 1.13, GFR >60 Hgb 12.1, HCT 36.7, MCV 92.0, platelet 213  Allergies   Allergies  Allergen Reactions   Metoclopramide Itching    Loss of Balance; Urinary Incontinence   Nsaids     Other reaction(s): Other (See Comments) Renal Insufficiency Kidney issues    Amoxicillin-Pot Clavulanate Diarrhea and Nausea And Vomiting    Other reaction(s): Other (See Comments) Abdomen Pain Abdomen pain    Statins     Other reaction(s): Other (See Comments) Myalgias and Myositis myalgias    Allopurinol Rash   Empagliflozin     Other reaction(s): Other (See Comments) Fainting, weakness, lightheaded, falling   Tiotropium     Other reaction(s): Other (See Comments) Urinary retention    Medications Prior to Visit:   Outpatient Medications Prior to Visit  Medication Sig Dispense Refill   acetaminophen (TYLENOL) 500 MG tablet Take 1,000 mg by mouth every 8 (eight) hours as needed for moderate pain.     Alpha-Lipoic Acid 300 MG TABS Take 600 mg by mouth daily.     aspirin EC 81  MG EC tablet Take 1 tablet (81 mg total) by mouth daily. Swallow whole. 30 tablet 0   benzonatate (TESSALON) 200 MG capsule Take 200 mg by mouth every 8 (eight) hours as needed for cough.     bisacodyl (DULCOLAX) 10 MG suppository Place 1 suppository (10 mg total) rectally daily as needed for up to 10 doses for moderate constipation. 10 suppository 0   carvedilol (COREG) 12.5 MG tablet Take 12.5 mg by mouth 2 (two) times daily with a meal.     Cholecalciferol 25 MCG (1000 UT) capsule Take 4,000 Units by mouth daily.     colchicine-probenecid 0.5-500 MG tablet Take 1 tablet by mouth daily.     cyanocobalamin (,VITAMIN B-12,) 1000 MCG/ML injection Inject 1,000 mcg into the skin every 30 (thirty) days.     doxycycline (VIBRA-TABS) 100 MG tablet Take 100 mg by mouth 2 (two) times daily.     ELIQUIS 5 MG TABS tablet Take 1 tablet (5 mg total) by mouth 2 (two) times daily. Resume on 05/11/21 (Patient taking differently: Take 5 mg by mouth 2 (two) times daily.) 60 tablet    fenofibrate 160 MG tablet Take 160 mg by mouth daily.     fluticasone (FLONASE) 50 MCG/ACT nasal spray Place 1 spray into both nostrils daily.     guaiFENesin (MUCINEX) 600 MG 12 hr tablet Take 600 mg by mouth 2 (two) times daily as needed for cough.     Icosapent Ethyl (VASCEPA) 0.5 g CAPS Take 1 g by mouth 2 (two) times daily. 240 capsule 0   lansoprazole (PREVACID) 30 MG capsule Take 30 mg by mouth in the morning and at bedtime.     latanoprost (XALATAN) 0.005 % ophthalmic solution Place 1 drop into the left eye at bedtime.     magnesium oxide (MAG-OX) 400 MG tablet Take 400 mg by mouth 2 (two) times daily.     meclizine (ANTIVERT) 25 MG tablet Take 25 mg by mouth 3 (three) times daily as needed for nausea.     metoCLOPramide (REGLAN) 5 MG tablet Take 2.5 mg by mouth at bedtime.     montelukast (SINGULAIR) 10 MG tablet Take 10 mg by mouth at bedtime.     nitroGLYCERIN (NITROSTAT) 0.4 MG SL tablet Place 0.4 mg under the tongue every  5 (five) minutes as needed for chest pain.  NOVOLOG MIX 70/30 FLEXPEN (70-30) 100 UNIT/ML FlexPen Inject 30 Units into the skin 2 (two) times daily with a meal. (Patient taking differently: Inject 40-85 Units into the skin 2 (two) times daily with a meal. Sliding scale) 15 mL 11   ondansetron (ZOFRAN) 8 MG tablet Take 8 mg by mouth every 8 (eight) hours as needed for nausea or vomiting.     Polyethylene Glycol 400 (BLINK TEARS) 0.25 % SOLN Place 1 drop into both eyes daily as needed (dry eyes).     PROAIR RESPICLICK 123XX123 (90 Base) MCG/ACT AEPB Inhale 2 puffs into the lungs every 6 (six) hours as needed (sob/wheezing).     Probiotic Product (ALIGN) 4 MG CAPS Take 4 mg by mouth daily.     simvastatin (ZOCOR) 10 MG tablet Take 1 tablet (10 mg total) by mouth daily at 6 PM. 30 tablet 0   clopidogrel (PLAVIX) 75 MG tablet Take 1 tablet (75 mg total) by mouth daily. 30 tablet 0   sodium chloride (MURO 128) 5 % ophthalmic ointment Place 1 application into both eyes at bedtime.     No facility-administered medications prior to visit.   Final Medications at End of Visit    Current Meds  Medication Sig   acetaminophen (TYLENOL) 500 MG tablet Take 1,000 mg by mouth every 8 (eight) hours as needed for moderate pain.   Alpha-Lipoic Acid 300 MG TABS Take 600 mg by mouth daily.   aspirin EC 81 MG EC tablet Take 1 tablet (81 mg total) by mouth daily. Swallow whole.   benzonatate (TESSALON) 200 MG capsule Take 200 mg by mouth every 8 (eight) hours as needed for cough.   bisacodyl (DULCOLAX) 10 MG suppository Place 1 suppository (10 mg total) rectally daily as needed for up to 10 doses for moderate constipation.   carvedilol (COREG) 12.5 MG tablet Take 12.5 mg by mouth 2 (two) times daily with a meal.   Cholecalciferol 25 MCG (1000 UT) capsule Take 4,000 Units by mouth daily.   colchicine-probenecid 0.5-500 MG tablet Take 1 tablet by mouth daily.   cyanocobalamin (,VITAMIN B-12,) 1000 MCG/ML injection Inject  1,000 mcg into the skin every 30 (thirty) days.   doxycycline (VIBRA-TABS) 100 MG tablet Take 100 mg by mouth 2 (two) times daily.   ELIQUIS 5 MG TABS tablet Take 1 tablet (5 mg total) by mouth 2 (two) times daily. Resume on 05/11/21 (Patient taking differently: Take 5 mg by mouth 2 (two) times daily.)   Evolocumab (REPATHA SURECLICK) XX123456 MG/ML SOAJ Inject 1 mL into the skin every 14 (fourteen) days.   fenofibrate 160 MG tablet Take 160 mg by mouth daily.   fluticasone (FLONASE) 50 MCG/ACT nasal spray Place 1 spray into both nostrils daily.   guaiFENesin (MUCINEX) 600 MG 12 hr tablet Take 600 mg by mouth 2 (two) times daily as needed for cough.   Icosapent Ethyl (VASCEPA) 0.5 g CAPS Take 1 g by mouth 2 (two) times daily.   lansoprazole (PREVACID) 30 MG capsule Take 30 mg by mouth in the morning and at bedtime.   latanoprost (XALATAN) 0.005 % ophthalmic solution Place 1 drop into the left eye at bedtime.   losartan (COZAAR) 25 MG tablet Take 1 tablet (25 mg total) by mouth daily.   magnesium oxide (MAG-OX) 400 MG tablet Take 400 mg by mouth 2 (two) times daily.   meclizine (ANTIVERT) 25 MG tablet Take 25 mg by mouth 3 (three) times daily as needed for nausea.  metoCLOPramide (REGLAN) 5 MG tablet Take 2.5 mg by mouth at bedtime.   montelukast (SINGULAIR) 10 MG tablet Take 10 mg by mouth at bedtime.   nitroGLYCERIN (NITROSTAT) 0.4 MG SL tablet Place 0.4 mg under the tongue every 5 (five) minutes as needed for chest pain.   NOVOLOG MIX 70/30 FLEXPEN (70-30) 100 UNIT/ML FlexPen Inject 30 Units into the skin 2 (two) times daily with a meal. (Patient taking differently: Inject 40-85 Units into the skin 2 (two) times daily with a meal. Sliding scale)   ondansetron (ZOFRAN) 8 MG tablet Take 8 mg by mouth every 8 (eight) hours as needed for nausea or vomiting.   Polyethylene Glycol 400 (BLINK TEARS) 0.25 % SOLN Place 1 drop into both eyes daily as needed (dry eyes).   PROAIR RESPICLICK 123XX123 (90 Base) MCG/ACT  AEPB Inhale 2 puffs into the lungs every 6 (six) hours as needed (sob/wheezing).   Probiotic Product (ALIGN) 4 MG CAPS Take 4 mg by mouth daily.   simvastatin (ZOCOR) 10 MG tablet Take 1 tablet (10 mg total) by mouth daily at 6 PM.   [DISCONTINUED] clopidogrel (PLAVIX) 75 MG tablet Take 1 tablet (75 mg total) by mouth daily.   Radiology:   No results found.  Cardiac Studies:   Echocardiogram 08/10/2021:  1. Left ventricular ejection fraction, by estimation, is 55 to 60%. The  left ventricle has normal function. Left ventricular endocardial border  not optimally defined to evaluate regional wall motion. Left ventricular  diastolic parameters are  indeterminate 2D images were obtain on 08/09/2021 LVEF 45-50% w/ RWMA  along the anterolateral and inferolateral walls. Definity images were  obtained 08/10/2021 which notes improvement in LVEF 55-60% without  regional wall motion abnormalities.   2. Right ventricular systolic function is normal. The right ventricular  size is normal. Mildly increased right ventricular wall thickness.   3. A small pericardial effusion is present. There is no evidence of  cardiac tamponade.   4. The mitral valve is normal in structure. Trivial mitral valve  regurgitation. No evidence of mitral stenosis.   5. The aortic valve is tricuspid. Aortic valve regurgitation is not  visualized. No aortic stenosis is present.   6. The inferior vena cava is normal in size with <50% respiratory  variability, suggesting right atrial pressure of 8 mmHg.  Left Heart Catheterization 08/12/21:  LV: 113/6, EDP 18 mmHg.  Ao 112/50, mean 75 mmHg.  No pressure gradient across the aortic valve. LM: Large vessel, 10 to 20% stenosis. LAD: Large vessel, it is occluded after giving origin to a small D1.  Mid to distal LAD is supplied by contralateral collaterals from the RCA.  D1 is diffusely diseased and is occluded in the mid to distal segment.  Mid to distal LAD appears to be widely  patent with mild disease and a good target for bypass. RI: Small vessel, no significant disease. CX: Large vessel.  Gives origin to large OM1 which is a high-grade 95% proximal stenosis followed by 20 to 30% diffuse mid segment stenosis.  Distal vessel is a good target for bypass.  There is a large OM 2 that is ulcerated and occluded in the proximal and mid segment and appears to be diffusely diseased in the proximal and mid segment.  It is a large vessel and distal vessel appears to be a good target for bypass. RCA: Dominant, large vessel.  Gives origin to 2 PDA branches, proximal PDA branch is smooth and normal.  After this distal RCA  has a high-grade 90 to 95% bifurcation stenosis involving the PL branch.  Distal RCA gives excellent collaterals to the LAD.   Impression: Mild increase in LVEDP.  LV gram not performed to conserve contrast.  Multivessel disease, ideally speaking would benefit from CABG.  However patient is essentially bedbound and wheelchair-bound due to obesity, peripheral neuropathy and generalized deconditioning.  Discussed with wife, patient does not appear to be a good candidate for CABG as his recovery would be extremely complicated.  Best option is to proceed with multivessel PCI electively, in spite of two-vessel disease, EF appears to be preserved.  I would like him to recuperate from his acute renal failure.  I have discussed all the options with patient's wife who is in agreement and favors conservative therapy/PCI over surgical therapy.  50 mL contrast utilized. Patient presently on Eliquis for PE, I have started him on aspirin along with Plavix for now.  EKG:   08/10/2021: NSR, 73bpm, subtle ST depression high lateral and lateral leads, PRWP, without injury pattern.  Assessment     ICD-10-CM   1. Coronary artery disease involving native coronary artery of native heart without angina pectoris  I25.10     2. History of non-ST elevation myocardial infarction (NSTEMI)   I25.2     3. Essential hypertension  99991111 Basic metabolic panel    4. Hypercholesteremia  E78.00     5. History of pulmonary embolism  Z86.711     6. Type 2 diabetes mellitus with hyperlipidemia (HCC)  E11.69    E78.5        Medications Discontinued During This Encounter  Medication Reason   clopidogrel (PLAVIX) 75 MG tablet Discontinued by provider    Meds ordered this encounter  Medications   losartan (COZAAR) 25 MG tablet    Sig: Take 1 tablet (25 mg total) by mouth daily.    Dispense:  30 tablet    Refill:  3   Evolocumab (REPATHA SURECLICK) [id number] MG/ML SOAJ    Sig: Inject 1 mL into the skin every 14 (fourteen) days.    Dispense:  2 mL    Refill:  3    Recommendations:   Randy Liu is a 70 y.o. Insulin-dependent diabetes mellitus type 2, chronic HFpEF, hyperlipidemia (previous statin intolerance), hypertriglyceridemia, GERD, history of PE on oral anticoagulation, predominantly bedbound due to peripheral neuropathy due to diabetes and COVID-19 infection, obesity due to excess calories.  Patient admitted 08/09/2021 - 08/13/2021 when he presented with nausea and vomiting secondary to partial small bowel obstruction.  During hospitalization patient developed chest pain and troponin became elevated, he therefore underwent cardiac catheterization 08/12/2021 for NSTEMI.  Patient was found to have multivessel disease, shared decision at the time between interventional cardiology and the patient's wife was that patient was not a candidate for CABG given poor functional status and multiple medical comorbidities. Rather decided to consider outpatient staged PCI.   Patient was subsequently discharged with low-dose aspirin, carvedilol, Vascepa, simvastatin Plavix as well as Eliquis given history of PE.  He presents today for follow-up.  Reviewed at length with patient and his wife hospitalization and cardiovascular testing results, particularly cardiac catheterization.  Patient and his  wife had multiple questions, which were addressed to their satisfaction.  In regard to CAD, patient is asymptomatic.  Given multiple medical comorbidities and declining functional status as he is now bedbound discussed with both Dr. Einar Gip and Dr. Terri Skains who agree with medical management at this time.  Patient is at high risk  given for complications of PCI, therefore we will hold off at this time.  Patient is certainly not a candidate for CABG.  Patient is his wife agree with medical management and no procedural intervention at this time.  Patient is currently tolerating simvastatin without issue.  However given underlying CAD and uncontrolled lipids would recommend initiation of PCSK9 inhibitor. Will start [place].Patient is tolerating medical therapy including carvedilol, simvastatin, aspirin, as well as Plavix, Vascepa.  Patient is also taking Eliquis, will therefore discontinue Plavix at this time.  He may continue Eliquis and aspirin alone.  We will also continue carvedilol, simvastatin.  Patient's blood pressure is mildly elevated in the office today and I personally reviewed external records, renal function has returned to normal.  We will therefore start losartan 25 mg p.o. once daily and repeat BMP in 1 week.  Patient has close follow-up with PCP who makes house calls, therefore we will follow-up in the office for [place] in 6 months, sooner if needed for CAD.  Patient was seen in collaboration with Dr. Einar Gip and Dr. Terri Skains who are in agreement with the plan.  During this visit I reviewed and updated: Tobacco history  allergies medication reconciliation  medical history  surgical history  family history  social history.  This note was created using a voice recognition software as a result there may be grammatical errors inadvertently enclosed that do not reflect the nature of this encounter. Every attempt is made to correct such errors.   Alethia Berthold, PA-C 08/20/2021, 3:48 PM Office:  [phone]

## 2021-08-27 ENCOUNTER — Other Ambulatory Visit: Payer: Self-pay

## 2021-08-27 MED ORDER — REPATHA SURECLICK 140 MG/ML ~~LOC~~ SOAJ: 1.0000 mL | SUBCUTANEOUS | 3 refills | Status: DC

## 2021-08-31 ENCOUNTER — Inpatient Hospital Stay (HOSPITAL_BASED_OUTPATIENT_CLINIC_OR_DEPARTMENT_OTHER)
Admission: EM | Admit: 2021-08-31 | Discharge: 2021-09-07 | DRG: 281 | Disposition: A | Discharge status: Home Health | Payer: Medicare Other | Attending: Internal Medicine | Admitting: Internal Medicine

## 2021-08-31 ENCOUNTER — Emergency Department (HOSPITAL_BASED_OUTPATIENT_CLINIC_OR_DEPARTMENT_OTHER): Payer: Medicare Other

## 2021-08-31 ENCOUNTER — Encounter (HOSPITAL_BASED_OUTPATIENT_CLINIC_OR_DEPARTMENT_OTHER): Payer: Self-pay | Admitting: Emergency Medicine

## 2021-08-31 ENCOUNTER — Other Ambulatory Visit: Payer: Self-pay

## 2021-08-31 DIAGNOSIS — K257 Chronic gastric ulcer without hemorrhage or perforation: Secondary | ICD-10-CM | POA: Diagnosis not present

## 2021-08-31 DIAGNOSIS — E785 Hyperlipidemia, unspecified: Secondary | ICD-10-CM | POA: Diagnosis present

## 2021-08-31 DIAGNOSIS — E1143 Type 2 diabetes mellitus with diabetic autonomic (poly)neuropathy: Secondary | ICD-10-CM | POA: Diagnosis not present

## 2021-08-31 DIAGNOSIS — R1084 Generalized abdominal pain: Secondary | ICD-10-CM

## 2021-08-31 DIAGNOSIS — N281 Cyst of kidney, acquired: Secondary | ICD-10-CM | POA: Diagnosis present

## 2021-08-31 DIAGNOSIS — R112 Nausea with vomiting, unspecified: Secondary | ICD-10-CM | POA: Diagnosis present

## 2021-08-31 DIAGNOSIS — I5032 Chronic diastolic (congestive) heart failure: Secondary | ICD-10-CM | POA: Diagnosis not present

## 2021-08-31 DIAGNOSIS — R778 Other specified abnormalities of plasma proteins: Secondary | ICD-10-CM

## 2021-08-31 DIAGNOSIS — I700 Atherosclerosis of aorta: Secondary | ICD-10-CM | POA: Diagnosis present

## 2021-08-31 DIAGNOSIS — I21A1 Myocardial infarction type 2: Secondary | ICD-10-CM | POA: Diagnosis not present

## 2021-08-31 DIAGNOSIS — E781 Pure hyperglyceridemia: Secondary | ICD-10-CM | POA: Diagnosis present

## 2021-08-31 DIAGNOSIS — K219 Gastro-esophageal reflux disease without esophagitis: Secondary | ICD-10-CM | POA: Diagnosis not present

## 2021-08-31 DIAGNOSIS — Z87891 Personal history of nicotine dependence: Secondary | ICD-10-CM

## 2021-08-31 DIAGNOSIS — Z794 Long term (current) use of insulin: Secondary | ICD-10-CM

## 2021-08-31 DIAGNOSIS — K3184 Gastroparesis: Secondary | ICD-10-CM | POA: Diagnosis present

## 2021-08-31 DIAGNOSIS — K567 Ileus, unspecified: Secondary | ICD-10-CM | POA: Diagnosis not present

## 2021-08-31 DIAGNOSIS — I252 Old myocardial infarction: Secondary | ICD-10-CM | POA: Diagnosis not present

## 2021-08-31 DIAGNOSIS — J45909 Unspecified asthma, uncomplicated: Secondary | ICD-10-CM | POA: Diagnosis not present

## 2021-08-31 DIAGNOSIS — L729 Follicular cyst of the skin and subcutaneous tissue, unspecified: Secondary | ICD-10-CM | POA: Diagnosis present

## 2021-08-31 DIAGNOSIS — Z20822 Contact with and (suspected) exposure to covid-19: Secondary | ICD-10-CM | POA: Diagnosis not present

## 2021-08-31 DIAGNOSIS — K30 Functional dyspepsia: Secondary | ICD-10-CM | POA: Diagnosis present

## 2021-08-31 DIAGNOSIS — R5381 Other malaise: Secondary | ICD-10-CM | POA: Diagnosis present

## 2021-08-31 DIAGNOSIS — N1831 Chronic kidney disease, stage 3a: Secondary | ICD-10-CM

## 2021-08-31 DIAGNOSIS — I214 Non-ST elevation (NSTEMI) myocardial infarction: Secondary | ICD-10-CM | POA: Diagnosis present

## 2021-08-31 DIAGNOSIS — I2510 Atherosclerotic heart disease of native coronary artery without angina pectoris: Secondary | ICD-10-CM

## 2021-08-31 DIAGNOSIS — K5710 Diverticulosis of small intestine without perforation or abscess without bleeding: Secondary | ICD-10-CM | POA: Diagnosis present

## 2021-08-31 DIAGNOSIS — N179 Acute kidney failure, unspecified: Secondary | ICD-10-CM | POA: Diagnosis present

## 2021-08-31 DIAGNOSIS — Z886 Allergy status to analgesic agent status: Secondary | ICD-10-CM

## 2021-08-31 DIAGNOSIS — Z993 Dependence on wheelchair: Secondary | ICD-10-CM

## 2021-08-31 DIAGNOSIS — R7989 Other specified abnormal findings of blood chemistry: Secondary | ICD-10-CM

## 2021-08-31 DIAGNOSIS — Z86718 Personal history of other venous thrombosis and embolism: Secondary | ICD-10-CM

## 2021-08-31 DIAGNOSIS — I3139 Other pericardial effusion (noninflammatory): Secondary | ICD-10-CM | POA: Diagnosis not present

## 2021-08-31 DIAGNOSIS — I130 Hypertensive heart and chronic kidney disease with heart failure and stage 1 through stage 4 chronic kidney disease, or unspecified chronic kidney disease: Secondary | ICD-10-CM

## 2021-08-31 DIAGNOSIS — K862 Cyst of pancreas: Secondary | ICD-10-CM | POA: Diagnosis present

## 2021-08-31 DIAGNOSIS — Z6835 Body mass index (BMI) 35.0-35.9, adult: Secondary | ICD-10-CM

## 2021-08-31 DIAGNOSIS — E871 Hypo-osmolality and hyponatremia: Secondary | ICD-10-CM | POA: Diagnosis present

## 2021-08-31 DIAGNOSIS — I25110 Atherosclerotic heart disease of native coronary artery with unstable angina pectoris: Secondary | ICD-10-CM | POA: Diagnosis not present

## 2021-08-31 DIAGNOSIS — Z888 Allergy status to other drugs, medicaments and biological substances status: Secondary | ICD-10-CM

## 2021-08-31 DIAGNOSIS — Z7982 Long term (current) use of aspirin: Secondary | ICD-10-CM

## 2021-08-31 DIAGNOSIS — E1169 Type 2 diabetes mellitus with other specified complication: Secondary | ICD-10-CM | POA: Diagnosis not present

## 2021-08-31 DIAGNOSIS — Z8616 Personal history of COVID-19: Secondary | ICD-10-CM

## 2021-08-31 DIAGNOSIS — Z7401 Bed confinement status: Secondary | ICD-10-CM

## 2021-08-31 DIAGNOSIS — K317 Polyp of stomach and duodenum: Secondary | ICD-10-CM | POA: Diagnosis present

## 2021-08-31 DIAGNOSIS — R079 Chest pain, unspecified: Secondary | ICD-10-CM

## 2021-08-31 DIAGNOSIS — Z86711 Personal history of pulmonary embolism: Secondary | ICD-10-CM

## 2021-08-31 DIAGNOSIS — Z79899 Other long term (current) drug therapy: Secondary | ICD-10-CM

## 2021-08-31 DIAGNOSIS — K5909 Other constipation: Secondary | ICD-10-CM | POA: Diagnosis present

## 2021-08-31 DIAGNOSIS — E669 Obesity, unspecified: Secondary | ICD-10-CM | POA: Diagnosis not present

## 2021-08-31 DIAGNOSIS — Z880 Allergy status to penicillin: Secondary | ICD-10-CM

## 2021-08-31 DIAGNOSIS — E1122 Type 2 diabetes mellitus with diabetic chronic kidney disease: Secondary | ICD-10-CM | POA: Diagnosis present

## 2021-08-31 DIAGNOSIS — K2970 Gastritis, unspecified, without bleeding: Secondary | ICD-10-CM | POA: Diagnosis present

## 2021-08-31 DIAGNOSIS — Z7901 Long term (current) use of anticoagulants: Secondary | ICD-10-CM

## 2021-08-31 HISTORY — DX: Sleep apnea, unspecified: G47.30

## 2021-08-31 LAB — CBC WITH DIFFERENTIAL/PLATELET
Abs Immature Granulocytes: 0.03 10*3/uL (ref 0.00–0.07)
Basophils Absolute: 0 10*3/uL (ref 0.0–0.1)
Basophils Relative: 0 %
Eosinophils Absolute: 0.2 10*3/uL (ref 0.0–0.5)
Eosinophils Relative: 2 %
HCT: 41.2 % (ref 39.0–52.0)
Hemoglobin: 13.4 g/dL (ref 13.0–17.0)
Immature Granulocytes: 1 %
Lymphocytes Relative: 25 %
Lymphs Abs: 1.6 10*3/uL (ref 0.7–4.0)
MCH: 31 pg (ref 26.0–34.0)
MCHC: 32.5 g/dL (ref 30.0–36.0)
MCV: 95.4 fL (ref 80.0–100.0)
Monocytes Absolute: 0.4 10*3/uL (ref 0.1–1.0)
Monocytes Relative: 7 %
Neutro Abs: 4 10*3/uL (ref 1.7–7.7)
Neutrophils Relative %: 65 %
Platelets: 217 10*3/uL (ref 150–400)
RBC: 4.32 MIL/uL (ref 4.22–5.81)
RDW: 15.6 % — ABNORMAL HIGH (ref 11.5–15.5)
WBC: 6.2 10*3/uL (ref 4.0–10.5)
nRBC: 0 % (ref 0.0–0.2)

## 2021-08-31 LAB — URINALYSIS, ROUTINE W REFLEX MICROSCOPIC
Bilirubin Urine: NEGATIVE
Glucose, UA: NEGATIVE mg/dL
Hgb urine dipstick: NEGATIVE
Ketones, ur: NEGATIVE mg/dL
Leukocytes,Ua: NEGATIVE
Nitrite: NEGATIVE
Protein, ur: NEGATIVE mg/dL
Specific Gravity, Urine: 1.02 (ref 1.005–1.030)
pH: 7 (ref 5.0–8.0)

## 2021-08-31 LAB — COMPREHENSIVE METABOLIC PANEL
ALT: 33 U/L (ref 0–44)
AST: 36 U/L (ref 15–41)
Albumin: 3.6 g/dL (ref 3.5–5.0)
Alkaline Phosphatase: 40 U/L (ref 38–126)
Anion gap: 9 (ref 5–15)
BUN: 35 mg/dL — ABNORMAL HIGH (ref 8–23)
CO2: 26 mmol/L (ref 22–32)
Calcium: 9.6 mg/dL (ref 8.9–10.3)
Chloride: 103 mmol/L (ref 98–111)
Creatinine, Ser: 1.62 mg/dL — ABNORMAL HIGH (ref 0.61–1.24)
GFR, Estimated: 45 mL/min — ABNORMAL LOW (ref >60)
Glucose, Bld: 217 mg/dL — ABNORMAL HIGH (ref 70–99)
Potassium: 5 mmol/L (ref 3.5–5.1)
Sodium: 138 mmol/L (ref 135–145)
Total Bilirubin: 0.5 mg/dL (ref 0.3–1.2)
Total Protein: 7.3 g/dL (ref 6.5–8.1)

## 2021-08-31 LAB — PROTIME-INR
INR: 1.3 — ABNORMAL HIGH (ref 0.8–1.2)
Prothrombin Time: 16.2 seconds — ABNORMAL HIGH (ref 11.4–15.2)

## 2021-08-31 LAB — APTT: aPTT: 33 seconds (ref 24–36)

## 2021-08-31 LAB — RESP PANEL BY RT-PCR (FLU A&B, COVID) ARPGX2
Influenza A by PCR: NEGATIVE
Influenza B by PCR: NEGATIVE
SARS Coronavirus 2 by RT PCR: NEGATIVE

## 2021-08-31 LAB — GLUCOSE, CAPILLARY: Glucose-Capillary: 170 mg/dL — ABNORMAL HIGH (ref 70–99)

## 2021-08-31 LAB — TROPONIN I (HIGH SENSITIVITY)
Troponin I (High Sensitivity): 186 ng/L (ref <18)
Troponin I (High Sensitivity): 215 ng/L (ref <18)
Troponin I (High Sensitivity): 387 ng/L (ref <18)

## 2021-08-31 LAB — LIPASE, BLOOD: Lipase: 27 U/L (ref 11–51)

## 2021-08-31 MED ORDER — ICOSAPENT ETHYL 1 G PO CAPS
1.0000 g | ORAL_CAPSULE | Freq: Two times a day (BID) | ORAL | Status: DC
  Administered 2021-09-01 – 2021-09-07 (×13): 1 g via ORAL
  Filled 2021-08-31 (×15): qty 1

## 2021-08-31 MED ORDER — IOHEXOL 300 MG/ML  SOLN
100.0000 mL | Freq: Once | INTRAMUSCULAR | Status: AC | PRN
  Administered 2021-08-31: 15:00:00 100 mL via INTRAVENOUS

## 2021-08-31 MED ORDER — ASPIRIN EC 81 MG PO TBEC
81.0000 mg | DELAYED_RELEASE_TABLET | Freq: Every day | ORAL | Status: DC
  Administered 2021-09-01 – 2021-09-07 (×7): 81 mg via ORAL
  Filled 2021-08-31 (×7): qty 1

## 2021-08-31 MED ORDER — METOCLOPRAMIDE HCL 5 MG/ML IJ SOLN
10.0000 mg | Freq: Once | INTRAMUSCULAR | Status: AC
  Administered 2021-08-31: 17:00:00 10 mg via INTRAVENOUS
  Filled 2021-08-31: qty 2

## 2021-08-31 MED ORDER — NITROGLYCERIN 0.4 MG SL SUBL
0.4000 mg | SUBLINGUAL_TABLET | SUBLINGUAL | Status: DC | PRN
  Administered 2021-09-01 – 2021-09-07 (×2): 0.4 mg via SUBLINGUAL
  Filled 2021-08-31 (×3): qty 1

## 2021-08-31 MED ORDER — SODIUM CHLORIDE 0.9 % IV SOLN: 250.0000 mL | INTRAVENOUS | Status: DC | PRN

## 2021-08-31 MED ORDER — LACTATED RINGERS IV BOLUS
500.0000 mL | Freq: Once | INTRAVENOUS | Status: AC
  Administered 2021-08-31: 17:00:00 500 mL via INTRAVENOUS

## 2021-08-31 MED ORDER — ACETAMINOPHEN 650 MG RE SUPP: 650.0000 mg | Freq: Four times a day (QID) | RECTAL | Status: DC | PRN

## 2021-08-31 MED ORDER — SODIUM CHLORIDE 0.9% FLUSH
3.0000 mL | Freq: Two times a day (BID) | INTRAVENOUS | Status: DC
  Administered 2021-09-01 – 2021-09-07 (×11): 3 mL via INTRAVENOUS

## 2021-08-31 MED ORDER — ONDANSETRON HCL 4 MG/2ML IJ SOLN
4.0000 mg | Freq: Four times a day (QID) | INTRAMUSCULAR | Status: DC | PRN
  Administered 2021-09-01 – 2021-09-07 (×5): 4 mg via INTRAVENOUS
  Filled 2021-08-31 (×5): qty 2

## 2021-08-31 MED ORDER — LATANOPROST 0.005 % OP SOLN
1.0000 [drp] | Freq: Every day | OPHTHALMIC | Status: DC
  Administered 2021-09-01 – 2021-09-05 (×6): 1 [drp] via OPHTHALMIC
  Filled 2021-08-31: qty 2.5

## 2021-08-31 MED ORDER — ONDANSETRON HCL 4 MG PO TABS: 4.0000 mg | ORAL_TABLET | Freq: Four times a day (QID) | ORAL | Status: DC | PRN

## 2021-08-31 MED ORDER — CARVEDILOL 12.5 MG PO TABS
12.5000 mg | ORAL_TABLET | Freq: Two times a day (BID) | ORAL | Status: DC
  Administered 2021-09-01: 08:00:00 12.5 mg via ORAL
  Filled 2021-08-31: qty 1

## 2021-08-31 MED ORDER — INSULIN ASPART 100 UNIT/ML IJ SOLN
0.0000 [IU] | Freq: Three times a day (TID) | INTRAMUSCULAR | Status: DC
Start: 2021-09-01 — End: 2021-09-05
  Administered 2021-09-01 – 2021-09-02 (×6): 3 [IU] via SUBCUTANEOUS
  Administered 2021-09-03: 17:00:00 2 [IU] via SUBCUTANEOUS
  Administered 2021-09-03: 10:00:00 3 [IU] via SUBCUTANEOUS
  Administered 2021-09-03: 22:00:00 2 [IU] via SUBCUTANEOUS
  Administered 2021-09-03: 13:00:00 3 [IU] via SUBCUTANEOUS
  Administered 2021-09-04: 13:00:00 5 [IU] via SUBCUTANEOUS
  Administered 2021-09-04 – 2021-09-05 (×4): 3 [IU] via SUBCUTANEOUS

## 2021-08-31 MED ORDER — SENNOSIDES-DOCUSATE SODIUM 8.6-50 MG PO TABS: 1.0000 | ORAL_TABLET | Freq: Every evening | ORAL | Status: DC | PRN

## 2021-08-31 MED ORDER — SODIUM CHLORIDE 0.9% FLUSH: 3.0000 mL | INTRAVENOUS | Status: DC | PRN

## 2021-08-31 MED ORDER — ONDANSETRON HCL 4 MG/2ML IJ SOLN
4.0000 mg | Freq: Once | INTRAMUSCULAR | Status: AC
  Administered 2021-08-31: 15:00:00 4 mg via INTRAVENOUS
  Filled 2021-08-31: qty 2

## 2021-08-31 MED ORDER — INSULIN ASPART PROT & ASPART (70-30 MIX) 100 UNIT/ML ~~LOC~~ SUSP
40.0000 [IU] | Freq: Two times a day (BID) | SUBCUTANEOUS | Status: DC
  Administered 2021-09-01 – 2021-09-04 (×8): 40 [IU] via SUBCUTANEOUS
  Filled 2021-08-31: qty 10

## 2021-08-31 MED ORDER — POLYVINYL ALCOHOL 1.4 % OP SOLN
1.0000 [drp] | Freq: Every day | OPHTHALMIC | Status: DC | PRN
Start: 2021-08-31 — End: 2021-09-07

## 2021-08-31 MED ORDER — HEPARIN (PORCINE) 25000 UT/250ML-% IV SOLN
1650.0000 [IU]/h | INTRAVENOUS | Status: DC
  Administered 2021-08-31: 21:00:00 1400 [IU]/h via INTRAVENOUS
  Filled 2021-08-31: qty 250

## 2021-08-31 MED ORDER — ACETAMINOPHEN 325 MG PO TABS
650.0000 mg | ORAL_TABLET | Freq: Four times a day (QID) | ORAL | Status: DC | PRN
  Administered 2021-09-03 – 2021-09-05 (×3): 650 mg via ORAL
  Filled 2021-08-31 (×3): qty 2

## 2021-08-31 MED ORDER — LOSARTAN POTASSIUM 25 MG PO TABS
25.0000 mg | ORAL_TABLET | Freq: Every day | ORAL | Status: DC
  Administered 2021-09-01: 08:00:00 25 mg via ORAL
  Filled 2021-08-31: qty 1

## 2021-08-31 NOTE — ED Notes (Signed)
Pt tolerating PO fluids

## 2021-08-31 NOTE — ED Notes (Signed)
Report given to Vernona Rieger RN at [facility] and Jasa RN with [facility]

## 2021-08-31 NOTE — ED Provider Notes (Signed)
Patient's CT is negative for bowel obstruction.  He is not having any abdominal pain at this time patient symptoms may have been related to gastroparesis.  His second troponin is elevated at 215 and spoke with Dr. Odis Hollingshead who reported that patient can be admitted for heparin and they will follow and trend troponins.  Speaking with the patient he and his wife want to check a third troponin to see if it continues to go up to see if they would want to be admitted versus going home.  Third troponin is elevated at 387.  We will start patient on heparin and admit to [facility].  He remains chest pain-free at this time.   Gwyneth Sprout, MD 08/31/21 1901

## 2021-08-31 NOTE — ED Triage Notes (Signed)
Per ems pt from home , abdominal pain , nausea , emesis x 2 days , diabetic . Pt is bedridden per ems . EKG unremarkable per ems .

## 2021-08-31 NOTE — H&P (Signed)
History and Physical    Randy Liu [id number] DOB: 04/12/1951 DOA: 08/31/2021  PCP: [name], MD   Patient coming from: Home  Chief Complaint: nausea&vomiting, chest pain  HPI: Randy Liu is a 70 y.o. male with medical history significant for  HFpEF, DM2, HLD, GERD, DVT/PE on anticoagulation.  Went to [place] ER with complaints of mild abdominal pain, chest pain and nausea and vomiting.  He was very concerned as he had a bowel obstruction recently but is found to the emergency room ninth of the obstruction on CT scan.  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  Patient is bedbound and a poor surgical candidate so the decision was made for medical therapy.  He followed up with cardiology as an outpatient recently and had medication adjustments.  Today he reported having multiple doses of nausea and vomiting with the chest pressure in the central substernal region that did not radiate.  He denies having any shortness of breath.  At this time his abdominal pain nausea and vomiting are resolved and he does not have chest pain at this time.  He was found in the emergency room to have elevated troponin initially at 186 that then increased to over 350 and he was placed on heparin infusion and transferred to [facility] for further evaluation.  Cardiology was consulted, Dr. Terri Skains, who will follow patient  ED Course: Patient been hemodynamically stable in the emergency room.  Her troponin was 186 and then it increased to 215 and then increased again to 387.  CBC was unremarkable.  Creatinine was 1.62 which is about his baseline.  Glucose 217.  Electrolytes and LFTs normal.  COVID test negative.  Hospitalist service asked to observe and manage patient overnight  Review of Systems:  General: Denies fever, chills, weight loss, night sweats.  Denies dizziness.  Denies  change in appetite HENT: Denies head trauma, headache, denies change in hearing, tinnitus.  Denies nasal congestion or bleeding.  Denies sore throat, sores in mouth.  Denies difficulty swallowing Eyes: Denies blurry vision, pain in eye, drainage.  Denies discoloration of eyes. Neck: Denies pain.  Denies swelling.  Denies pain with movement. Cardiovascular: Reports chest pain. Denies palpitations. Denies orthopnea Respiratory: Denies shortness of breath, cough.  Denies wheezing.  Denies sputum production Gastrointestinal: Reports nausea and vomiting. Denies abdominal pain, swelling. Denies diarrhea. Denies melena. Denies hematemesis. Musculoskeletal: Denies pain or swelling of joints. Denies deformity  Genitourinary: Denies pelvic pain.  Denies urinary frequency or hesitancy.  Denies dysuria.  Skin: Denies rash.  Denies petechiae, purpura, ecchymosis. Neurological: Denies headache.  Denies syncope.  Denies seizure activity.  Denies weakness or paresthesia.  Denies slurred speech, drooping face.  Denies visual change. Psychiatric: Denies depression, anxiety.  Denies suicidal thoughts or ideation.  Denies hallucinations.  Past Medical History:  Diagnosis Date   Asthma    CHF (congestive heart failure) ([place])    Diabetes mellitus without complication ([place])    Hypertension     Past Surgical History:  Procedure Laterality Date   BIOPSY  12/14/2020   Procedure: BIOPSY;  Surgeon: Jackquline Denmark, MD;  Location: [facility];  Service: Endoscopy;;   CHOLECYSTECTOMY N/A 12/17/2020   Procedure: LAPAROSCOPIC CHOLECYSTECTOMY, LIVER BIOPSY, PRIMARY UMBILICAL HERNIA REPAIR;  Surgeon: Armandina Gemma, MD;  Location: [facility] ORS;  Service: General;  Laterality: N/A;   COLONOSCOPY WITH PROPOFOL N/A 05/08/2021   Procedure: COLONOSCOPY WITH PROPOFOL;  Surgeon: Carol Ada, MD;  Location: [facility];  Service: Endoscopy;  Laterality: N/A;   ENDOSCOPIC RETROGRADE CHOLANGIOPANCREATOGRAPHY (ERCP) WITH PROPOFOL N/A 12/14/2020    Procedure: ENDOSCOPIC RETROGRADE CHOLANGIOPANCREATOGRAPHY (ERCP) WITH PROPOFOL;  Surgeon: Jackquline Denmark, MD;  Location: [facility];  Service: Endoscopy;  Laterality: N/A;   LEFT HEART CATH AND CORONARY ANGIOGRAPHY N/A 08/12/2021   Procedure: LEFT HEART CATH AND CORONARY ANGIOGRAPHY;  Surgeon: Adrian Prows, MD;  Location: [facility];  Service: Cardiovascular;  Laterality: N/A;   POLYPECTOMY  05/08/2021   Procedure: POLYPECTOMY;  Surgeon: Carol Ada, MD;  Location: [facility];  Service: Endoscopy;;   REMOVAL OF STONES  12/14/2020   Procedure: REMOVAL OF STONES;  Surgeon: Jackquline Denmark, MD;  Location: [facility];  Service: Endoscopy;;   SPHINCTEROTOMY  12/14/2020   Procedure: Joan Mayans;  Surgeon: Jackquline Denmark, MD;  Location: [facility];  Service: Endoscopy;;    Social History  reports that he has quit smoking. He has never used smokeless tobacco. He reports that he does not currently use alcohol. He reports that he does not use drugs.  Allergies  Allergen Reactions   Metoclopramide Itching    Loss of Balance; Urinary Incontinence   Nsaids     Other reaction(s): Other (See Comments) Renal Insufficiency Kidney issues    Amoxicillin-Pot Clavulanate Diarrhea and Nausea And Vomiting    Other reaction(s): Other (See Comments) Abdomen Pain Abdomen pain    Statins     Other reaction(s): Other (See Comments) Myalgias and Myositis myalgias    Allopurinol Rash   Empagliflozin     Other reaction(s): Other (See Comments) Fainting, weakness, lightheaded, falling   Tiotropium     Other reaction(s): Other (See Comments) Urinary retention    History reviewed. No pertinent family history.   Prior to Admission medications   Medication Sig Start Date End Date Taking? Authorizing Provider  acetaminophen (TYLENOL) 500 MG tablet Take 1,000 mg by mouth every 8 (eight) hours as needed for moderate pain.    [provider]  Alpha-Lipoic Acid 300 MG TABS Take 600 mg by  mouth daily.    [provider]  aspirin EC 81 MG EC tablet Take 1 tablet (81 mg total) by mouth daily. Swallow whole. 08/14/21 09/13/21  Darliss Cheney, MD  benzonatate (TESSALON) 200 MG capsule Take 200 mg by mouth every 8 (eight) hours as needed for cough. 11/18/20   [provider]  bisacodyl (DULCOLAX) 10 MG suppository Place 1 suppository (10 mg total) rectally daily as needed for up to 10 doses for moderate constipation. 05/06/21   Antonieta Pert, MD  carvedilol (COREG) 12.5 MG tablet Take 12.5 mg by mouth 2 (two) times daily with a meal. 06/12/21   [provider]  Cholecalciferol 25 MCG (1000 UT) capsule Take 4,000 Units by mouth daily.    [provider]  colchicine-probenecid 0.5-500 MG tablet Take 1 tablet by mouth daily. 11/21/20   [provider]  cyanocobalamin (,VITAMIN B-12,) 1000 MCG/ML injection Inject 1,000 mcg into the skin every 30 (thirty) days. 09/23/20   [provider]  doxycycline (VIBRA-TABS) 100 MG tablet Take 100 mg by mouth 2 (two) times daily. 08/04/21   [provider]  ELIQUIS 5 MG TABS tablet Take 1 tablet (5 mg total) by mouth 2 (two) times daily. Resume on 05/11/21 Patient taking differently: Take 5 mg by mouth 2 (two) times daily. 05/11/21   Antonieta Pert, MD  Evolocumab (REPATHA SURECLICK) [id number] MG/ML SOAJ Inject 1 mL into the skin  every 14 (fourteen) days. 08/27/21   Adrian Prows, MD  fenofibrate 160 MG tablet Take 160 mg by mouth daily. 12/04/20   [provider]  fluticasone (FLONASE) 50 MCG/ACT nasal spray Place 1 spray into both nostrils daily. 07/08/21   [provider]  guaiFENesin (MUCINEX) 600 MG 12 hr tablet Take 600 mg by mouth 2 (two) times daily as needed for cough.    [provider]  Icosapent Ethyl (VASCEPA) 0.5 g CAPS Take 1 g by mouth 2 (two) times daily. 08/13/21 09/12/21  Darliss Cheney, MD  lansoprazole (PREVACID) 30 MG capsule Take 30 mg by mouth in the morning and at  bedtime. 12/02/20   [provider]  latanoprost (XALATAN) 0.005 % ophthalmic solution Place 1 drop into the left eye at bedtime. 10/29/20   [provider]  losartan (COZAAR) 25 MG tablet Take 1 tablet (25 mg total) by mouth daily. 08/20/21 12/18/21  Cantwell, Celeste C, PA-C  magnesium oxide (MAG-OX) 400 MG tablet Take 400 mg by mouth 2 (two) times daily. 10/09/20   [provider]  meclizine (ANTIVERT) 25 MG tablet Take 25 mg by mouth 3 (three) times daily as needed for nausea.    [provider]  metoCLOPramide (REGLAN) 5 MG tablet Take 2.5 mg by mouth at bedtime. 10/09/20   [provider]  montelukast (SINGULAIR) 10 MG tablet Take 10 mg by mouth at bedtime. 09/28/20   [provider]  nitroGLYCERIN (NITROSTAT) 0.4 MG SL tablet Place 0.4 mg under the tongue every 5 (five) minutes as needed for chest pain.    [provider]  NOVOLOG MIX 70/30 FLEXPEN (70-30) 100 UNIT/ML FlexPen Inject 30 Units into the skin 2 (two) times daily with a meal. Patient taking differently: Inject 40-85 Units into the skin 2 (two) times daily with a meal. Sliding scale 05/09/21   Antonieta Pert, MD  ondansetron (ZOFRAN) 8 MG tablet Take 8 mg by mouth every 8 (eight) hours as needed for nausea or vomiting.    [provider]  Polyethylene Glycol 400 (BLINK TEARS) 0.25 % SOLN Place 1 drop into both eyes daily as needed (dry eyes).    [provider]  PROAIR RESPICLICK 123XX123 (90 Base) MCG/ACT AEPB Inhale 2 puffs into the lungs every 6 (six) hours as needed (sob/wheezing). 04/03/21   [provider]  Probiotic Product (ALIGN) 4 MG CAPS Take 4 mg by mouth daily.    [provider]  simvastatin (ZOCOR) 10 MG tablet Take 1 tablet (10 mg total) by mouth daily at 6 PM. 08/13/21 09/12/21  Darliss Cheney, MD  sodium chloride (MURO 128) 5 % ophthalmic ointment Place 1 application into both eyes at bedtime.    [provider]    Physical  Exam: Vitals:   08/31/21 2100 08/31/21 2130 08/31/21 2200 08/31/21 2300  BP: 128/73 127/69 115/67 (!) 100/53  Pulse: 68 64  68  Resp: 14 18 18 19   Temp:    98.5 F (36.9 C)  TempSrc:    Oral  SpO2: 97% 96% 97% 96%  Weight:    122.2 kg  Height:    6\' 1"  (1.854 m)    Constitutional: NAD, calm, comfortable Vitals:   08/31/21 2100 08/31/21 2130 08/31/21 2200 08/31/21 2300  BP: 128/73 127/69 115/67 (!) 100/53  Pulse: 68 64  68  Resp: 14 18 18 19   Temp:    98.5 F (36.9 C)  TempSrc:    Oral  SpO2: 97% 96% 97% 96%  Weight:    122.2 kg  Height:    6\' 1"  (1.854 m)   General: WDWN, Alert and oriented x3.  Eyes: EOMI, PERRL, conjunctivae normal.  Sclera nonicteric HENT:  [place]/AT, external ears normal.  Nares patent without epistasis.  Mucous membranes are moist. Neck: Soft, normal range of motion, supple, no masses, Trachea midline Respiratory: clear to auscultation bilaterally, no wheezing, no crackles. Normal respiratory effort. No accessory muscle use.  Cardiovascular: Regular rate and rhythm, no murmurs / rubs / gallops. 2+ pedal pulses.  Abdomen: Soft, no tenderness, nondistended, no rebound or guarding. Obese. Bowel sounds normoactive Musculoskeletal: FROM upper extremiteis. Limited movement of lower extremities. No cyanosis.  Skin: Warm, dry, intact no rashes, lesions, ulcers. No induration Neurologic: CN 2-12 grossly intact.  Normal speech. Strength 5/5 in upper extremities.   Psychiatric: Normal judgment and insight.  Normal mood.    Labs on Admission: I have personally reviewed following labs and imaging studies  CBC: Recent Labs  Lab 08/31/21 1325  WBC 6.2  NEUTROABS 4.0  HGB 13.4  HCT 41.2  MCV 95.4  PLT 217    Basic Metabolic Panel: Recent Labs  Lab 08/31/21 1325  NA 138  K 5.0  CL 103  CO2 26  GLUCOSE 217*  BUN 35*  CREATININE 1.62*  CALCIUM 9.6    GFR: Estimated Creatinine Clearance: 56.4 mL/min (A) (by C-G formula based on SCr of 1.62 mg/dL  (H)).  Liver Function Tests: Recent Labs  Lab 08/31/21 1325  AST 36  ALT 33  ALKPHOS 40  BILITOT 0.5  PROT 7.3  ALBUMIN 3.6    Urine analysis:    Component Value Date/Time   COLORURINE YELLOW 08/31/2021 1325   APPEARANCEUR CLEAR 08/31/2021 1325   LABSPEC 1.020 08/31/2021 1325   PHURINE 7.0 08/31/2021 1325   GLUCOSEU NEGATIVE 08/31/2021 1325   HGBUR NEGATIVE 08/31/2021 1325   BILIRUBINUR NEGATIVE 08/31/2021 1325   KETONESUR NEGATIVE 08/31/2021 1325   PROTEINUR NEGATIVE 08/31/2021 1325   NITRITE NEGATIVE 08/31/2021 1325   LEUKOCYTESUR NEGATIVE 08/31/2021 1325    Radiological Exams on Admission: CT ABDOMEN PELVIS W CONTRAST  Result Date: 08/31/2021 CLINICAL DATA:  Left lower quadrant abdominal pain. EXAM: CT ABDOMEN AND PELVIS WITH CONTRAST TECHNIQUE: Multidetector CT imaging of the abdomen and pelvis was performed using the standard protocol following bolus administration of intravenous contrast. CONTRAST:  09/02/2021 OMNIPAQUE IOHEXOL 300 MG/ML  SOLN COMPARISON:  CT abdomen and pelvis 08/09/2021. CT abdomen and pelvis 06/10/2019. FINDINGS: Lower chest: There is atelectasis in the lung bases. Small pericardial effusion is unchanged. Hepatobiliary: No focal liver abnormality is seen. Status post cholecystectomy. No biliary dilatation. Pancreas: There is an 11 mm hypodense area in the uncinate process of the pancreas which is stable from 08/09/2021, but new from 06/10/2019. The pancreas is otherwise within normal limits. Spleen: Normal in size without focal abnormality. Adrenals/Urinary Tract: There is an 8.2 cm cyst in the left kidney similar to the prior study. There is a stable 2 cm cyst in the inferior right kidney. There is no hydronephrosis. Adrenal glands and bladder are within normal limits. Stomach/Bowel: Stomach is within normal limits. Appendix appears normal. No evidence of bowel wall thickening, distention, or inflammatory changes. Vascular/Lymphatic: Aortic atherosclerosis. No  enlarged abdominal or pelvic lymph nodes. Reproductive: Prostate is unremarkable. Other: There is a small fat containing left inguinal hernia. There is no ascites. There is mild body wall edema. Musculoskeletal: No acute or significant osseous findings. IMPRESSION: 1. Grossly unchanged 8.2 cm left  inferior pole renal cyst. 2. Stable small pericardial effusion. 3. Mild body wall edema. 4. Hypodense lesion in the uncinate process of the pancreas, unchanged from 08/09/2021 and new from 06/10/2019. Recommend follow-up pancreatic MRI. 5. Aortic Atherosclerosis (ICD10-I70.0). Electronically Signed   By: Ronney Asters M.D.   On: 08/31/2021 15:37   DG Chest Portable 1 View  Result Date: 08/31/2021 CLINICAL DATA:  Chest pain. EXAM: PORTABLE CHEST 1 VIEW COMPARISON:  Chest radiograph 08/09/2021 FINDINGS: Interval removal of enteric tube. No focal consolidation, pleural effusion, or pneumothorax. Stable cardiomediastinal silhouette. Aortic calcifications. Slight rightward deviation of the trachea at the thoracic inlet, consistent with left thyroid goiter demonstrated on prior CT 02/29/2020. IMPRESSION: 1. Interval removal of enteric tube. 2. No acute cardiopulmonary abnormality. 3. Aortic Atherosclerosis (ICD10-I70.0). Electronically Signed   By: Ileana Roup M.D.   On: 08/31/2021 15:28    EKG: Independently reviewed.  EKG shows normal sinus rhythm with left anterior fascicular block.  There is no acute ST elevation or depression.  No signs of ischemia.  QTc 392  Assessment/Plan Principal Problem:   NSTEMI (non-ST elevated myocardial infarction)  Mr. Sookdeo with left heart catheterization that showed multivessel CAD that was going to be treated medically.  Tonight with patient having chest pain with nausea and vomiting and elevated troponin levels he has been placed on cardiac telemetry floor for observation and further trending of troponins.  Cardiology has been consulted and will see patient to make further  recommendations to suggesting medical therapy versus left heart cath with stent placement.  Patient is placed on heparin infusion for anticoagulation overnight  Active Problems:   Coronary artery disease involving native coronary artery of native heart with unstable angina pectoris  Nitroglycerin sublingually as needed for chest pain    Type 2 diabetes mellitus with hyperlipidemia Continue basal insulin.  Monitor blood sugars with meals and bedtime.  Corrective insulin provided for glycemic control.    CKD (chronic kidney disease) stage 3, GFR 30-59 ml/min  Stable.  Monitor electrolytes and renal function labs in morning    Nausea and vomiting Zofran provided as needed.   DVT prophylaxis: Patient is on heparin infusion for NSTEMI Code Status:   Full code Family Communication:  Diagnosis and plan discussed with patient.  He verbalized understanding agrees with plan.  Further recommendations to follow as clinical indicated Disposition Plan:   Patient is from:  Home  Anticipated DC to:  Home  Anticipated DC date:  Anticipate less than 2 midnight stay in the hospital although this may change depending on cardiology recommendations  Consults called:  Cardiology consulted by ER physician  Admission status:    Observation  Yevonne Aline Priyanka Causey MD [facility]  How to contact the [facility] Attending or Consulting provider [place] [facility] or covering provider during after hours [place], for this patient?   Check the care team in [facility] and look for a) attending/consulting [facility] provider listed and b) the [facility] team listed Log into [email or web address] and use [place]'s universal password to access. If you do not have the password, please contact the hospital operator. Locate the [facility] provider you are looking for under [facility] and page to a number that you can be directly reached. If you still have difficulty reaching the provider, please page the [facility] (Director on Call) for the Hospitalists listed on  amion for assistance.  08/31/2021, 11:50 PM

## 2021-08-31 NOTE — Progress Notes (Signed)
ANTICOAGULATION CONSULT NOTE - Initial Consult  Pharmacy Consult for Heparin Indication: chest pain/ACS  Allergies  Allergen Reactions   Metoclopramide Itching    Loss of Balance; Urinary Incontinence   Nsaids     Other reaction(s): Other (See Comments) Renal Insufficiency Kidney issues    Amoxicillin-Pot Clavulanate Diarrhea and Nausea And Vomiting    Other reaction(s): Other (See Comments) Abdomen Pain Abdomen pain    Statins     Other reaction(s): Other (See Comments) Myalgias and Myositis myalgias    Allopurinol Rash   Empagliflozin     Other reaction(s): Other (See Comments) Fainting, weakness, lightheaded, falling   Tiotropium     Other reaction(s): Other (See Comments) Urinary retention    Patient Measurements: Height: 6\' 1"  (185.4 cm) Weight: 123.8 kg (273 lb) IBW/kg (Calculated) : 79.9 Heparin Dosing Weight: 107.1 kg  Vital Signs: Temp: 97.6 F (36.4 C) (12/18 1331) Temp Source: Oral (12/18 1331) BP: 139/66 (12/18 1845) Pulse Rate: 63 (12/18 1845)  Labs: Recent Labs    08/31/21 1325 08/31/21 1338 08/31/21 1548 08/31/21 1809  HGB 13.4  --   --   --   HCT 41.2  --   --   --   PLT 217  --   --   --   CREATININE 1.62*  --   --   --   TROPONINIHS  --  186* 215* 387*    Estimated Creatinine Clearance: 56.8 mL/min (A) (by C-G formula based on SCr of 1.62 mg/dL (H)).   Medical History: Past Medical History:  Diagnosis Date   Asthma    CHF (congestive heart failure) (HCC)    Diabetes mellitus without complication ([facility])    Hypertension     Medications:  (Not in a hospital admission)  Scheduled:  Infusions:  PRN:   Assessment: 70 yom with a history of HFpEF, DM2, HLD, GERD, DVT/PE on anticoagulation. Patient is presenting with abdominal pain. Heparin per pharmacy consult placed for chest pain/ACS.  Patient with a history of small bowel obstruction/partial ileus. However, CT is negative for bowel obstruction.  Patient is on apixaban  prior to arrival. Last dose 12/17pm. Will require aPTT monitoring due to likely falsely high anti-Xa level secondary to DOAC use.  hsTrop 186>>215>>387 Hgb 13.4; plt 215  Goal of Therapy:  Heparin level 0.3-0.7 units/ml aPTT 66-102 seconds Monitor platelets by anticoagulation protocol: Yes   Plan:  No initial heparin bolus Start heparin infusion at 1400 units/hr Check aPTT & anti-Xa level in 6 hours and daily while on heparin Continue to monitor via aPTT until levels are correlated Continue to monitor H&H and platelets  09/02/21, PharmD, BCPS 08/31/2021 8:01 PM ED Clinical Pharmacist -  [phone]

## 2021-08-31 NOTE — Progress Notes (Signed)
ON-CALL CARDIOLOGY 08/31/21  Patient's name: Randy Liu.   MRN: [id number].    DOB: [birth year] Primary care provider: Felton Clinton, MD. Primary cardiologist: Tessa Lerner, DO, [facility]  Interaction regarding this patient's care today: Called by ED physician Dr. Alvira Monday regarding the patient's ED visit at [facility].  Patient presents with abdominal pain, nausea and vomiting.  He has history of small bowel obstruction/partial ileus in the past.  Recently hospitalized at [facility] earlier this year with similar complaints along with uptrending troponin suggestive of NSTEMI.  Patient did undergo invasive angiography and was noted to have multivessel CAD.  Since the patient is predominantly bedbound, unable to transfer, and multiple comorbid conditions he was felt to be a poor surgical candidate and the initial plan was to consider staged outpatient PCI.  When he presented to the office for follow-up he was asymptomatic. Given his worsening overall functional status, immobility, needing EMS transfer to and from doctor's appointments the shared decision between medical providers, patient, and his wife was medical management and improving his comorbid conditions.  Initial troponin today is noted to be 186.  EKG shows sinus rhythm without underlying injury pattern.  Recommend trending troponins for now.  CT of the abdomen still pending.  Further recommendations to follow.  No charge.   Delilah Shan [facility]  Pager: [phone] Office: [phone] 08/31/21 3:16 PM

## 2021-08-31 NOTE — Progress Notes (Signed)
Pt has home CPAP unit. ?

## 2021-08-31 NOTE — ED Provider Notes (Signed)
[facility] EMERGENCY DEPARTMENT Provider Note   CSN: [id number] Arrival date & time: 08/31/21  1320     History Chief Complaint  Patient presents with   Abdominal Pain    Emesis     Randy Liu is a 70 y.o. male.  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   He began to have nausea and vomiting on Friday night, felt somewhat better during Saturday during the day, but then Saturday night developed nausea and vomiting which has continued today.  Reports has had 3 episodes of emesis today, the last of which appeared to be more green and "like bile."  Reports he has had some shortness of breath since last Wednesday.  Describes abdominal discomfort as a tightness going across the center of his abdomen, as well as sensation of burning chest pain which she associates with several episodes of nausea and vomiting.  The chest pain does not radiate to his neck, back, or arms.  He denies diarrhea, but does report he has had some constipation.  He was given a suppository by his wife and was able to have a bowel movement on Saturday, but otherwise has not had normal bowel movement since before Thursday.  He reports he is passing flatus but wife reports he has not today.  He feels like his abdomen is distended.  Cough for several weeks. No fever.    Past Medical History:  Diagnosis Date   Asthma    CHF (congestive heart failure) ([place])    Diabetes mellitus without complication ([place])    Hypertension     Patient Active Problem List   Diagnosis Date Noted   Atherosclerosis of native coronary  artery of native heart without angina pectoris    Coronary artery disease involving native coronary artery of native heart with unstable angina pectoris ([place])    AKI (acute kidney injury) ([place])    Pure hypercholesterolemia    NSTEMI (non-ST elevated myocardial infarction) ([place]) 08/10/2021   Elevated troponin    Ileus ([place]) 08/09/2021   Non-ST elevation (NSTEMI) myocardial infarction ([place])    Type 2 diabetes mellitus with hyperlipidemia (H[facility])    Hypertriglyceridemia    Intractable abdominal pain    Nausea and vomiting    History of pulmonary embolism    Statin intolerance    Hematochezia 05/07/2021   Small bowel obstruction ([place]) 05/01/2021   SBO (small bowel obstruction) ([place]) 05/01/2021   Yeast infection 12/15/2020   Elevated LFTs 12/15/2020   Acute hypoxemic respiratory failure ([place]) 12/13/2020   Hyponatremia 12/13/2020   Asthma 12/13/2020   Diabetes ([place]) 12/13/2020   Acute cholangitis 12/12/2020    Past Surgical History:  Procedure Laterality Date   BIOPSY  12/14/2020   Procedure: BIOPSY;  Surgeon: Jackquline Denmark, MD;  Location: [facility];  Service: Endoscopy;;   CHOLECYSTECTOMY N/A 12/17/2020   Procedure: LAPAROSCOPIC CHOLECYSTECTOMY, LIVER BIOPSY, PRIMARY UMBILICAL HERNIA REPAIR;  Surgeon: Armandina Gemma, MD;  Location: [facility] ORS;  Service: General;  Laterality: N/A;   COLONOSCOPY WITH PROPOFOL N/A 05/08/2021   Procedure: COLONOSCOPY WITH PROPOFOL;  Surgeon: Carol Ada, MD;  Location: [facility];  Service: Endoscopy;  Laterality: N/A;   ENDOSCOPIC RETROGRADE CHOLANGIOPANCREATOGRAPHY (ERCP) WITH PROPOFOL  N/A 12/14/2020   Procedure: ENDOSCOPIC RETROGRADE CHOLANGIOPANCREATOGRAPHY (ERCP) WITH PROPOFOL;  Surgeon: Lynann Bologna, MD;  Location: [facility];  Service: Endoscopy;  Laterality: N/A;   LEFT HEART CATH AND CORONARY ANGIOGRAPHY N/A 08/12/2021   Procedure: LEFT HEART CATH AND CORONARY ANGIOGRAPHY;  Surgeon: Yates Decamp, MD;  Location: [facility]AS[facility];  Service:  Cardiovascular;  Laterality: N/A;   POLYPECTOMY  05/08/2021   Procedure: POLYPECTOMY;  Surgeon: Jeani Hawking, MD;  Location: [facility];  Service: Endoscopy;;   REMOVAL OF STONES  12/14/2020   Procedure: REMOVAL OF STONES;  Surgeon: Lynann Bologna, MD;  Location: [facility];  Service: Endoscopy;;   SPHINCTEROTOMY  12/14/2020   Procedure: Dennison Mascot;  Surgeon: Lynann Bologna, MD;  Location: [facility];  Service: Endoscopy;;       No family history on file.  Social History   Tobacco Use   Smoking status: Former   Smokeless tobacco: Never  Substance Use Topics   Alcohol use: Not Currently   Drug use: Never    Home Medications Prior to Admission medications   Medication Sig Start Date End Date Taking? Authorizing Provider  acetaminophen (TYLENOL) 500 MG tablet Take 1,000 mg by mouth every 8 (eight) hours as needed for moderate pain.    [provider]  Alpha-Lipoic Acid 300 MG TABS Take 600 mg by mouth daily.    [provider]  aspirin EC 81 MG EC tablet Take 1 tablet (81 mg total) by mouth daily. Swallow whole. 08/14/21 09/13/21  Hughie Closs, MD  benzonatate (TESSALON) 200 MG capsule Take 200 mg by mouth every 8 (eight) hours as needed for cough. 11/18/20   [provider]  bisacodyl (DULCOLAX) 10 MG suppository Place 1 suppository (10 mg total) rectally daily as needed for up to 10 doses for moderate constipation. 05/06/21   Lanae Boast, MD  carvedilol (COREG) 12.5 MG tablet Take 12.5 mg by mouth 2 (two) times daily with a meal. 06/12/21   [provider]  Cholecalciferol 25 MCG (1000 UT) capsule Take 4,000 Units by mouth daily.    [provider]  colchicine-probenecid 0.5-500 MG tablet Take 1 tablet by mouth daily. 11/21/20   [provider]  cyanocobalamin (,VITAMIN B-12,) 1000 MCG/ML injection Inject 1,000 mcg into the skin every 30 (thirty) days. 09/23/20   [provider]  doxycycline (VIBRA-TABS) 100 MG tablet Take  100 mg by mouth 2 (two) times daily. 08/04/21   [provider]  ELIQUIS 5 MG TABS tablet Take 1 tablet (5 mg total) by mouth 2 (two) times daily. Resume on 05/11/21 Patient taking differently: Take 5 mg by mouth 2 (two) times daily. 05/11/21   Lanae Boast, MD  Evolocumab (REPATHA SURECLICK) 140 MG/ML SOAJ Inject 1 mL into the skin every 14 (fourteen) days. 08/27/21   Yates Decamp, MD  fenofibrate 160 MG tablet Take 160 mg by mouth daily. 12/04/20   [provider]  fluticasone (FLONASE) 50 MCG/ACT nasal spray Place 1 spray into both nostrils daily. 07/08/21   [provider]  guaiFENesin (MUCINEX) 600 MG 12 hr tablet Take 600 mg by mouth 2 (two) times daily as needed for cough.    [provider]  Icosapent Ethyl (VASCEPA) 0.5 g CAPS Take 1 g by mouth 2 (two) times daily. 08/13/21 09/12/21  Hughie Closs, MD  lansoprazole (PREVACID) 30 MG capsule Take 30 mg by mouth in the morning and at bedtime. 12/02/20   [provider]  latanoprost (XALATAN) 0.005 % ophthalmic solution Place  1 drop into the left eye at bedtime. 10/29/20   [provider]  losartan (COZAAR) 25 MG tablet Take 1 tablet (25 mg total) by mouth daily. 08/20/21 12/18/21  Cantwell, Celeste C, PA-C  magnesium oxide (MAG-OX) 400 MG tablet Take 400 mg by mouth 2 (two) times daily. 10/09/20   [provider]  meclizine (ANTIVERT) 25 MG tablet Take 25 mg by mouth 3 (three) times daily as needed for nausea.    [provider]  metoCLOPramide (REGLAN) 5 MG tablet Take 2.5 mg by mouth at bedtime. 10/09/20   [provider]  montelukast (SINGULAIR) 10 MG tablet Take 10 mg by mouth at bedtime. 09/28/20   [provider]  nitroGLYCERIN (NITROSTAT) 0.4 MG SL tablet Place 0.4 mg under the tongue every 5 (five) minutes as needed for chest pain.    [provider]  NOVOLOG MIX 70/30 FLEXPEN (70-30) 100 UNIT/ML FlexPen Inject 30 Units into the skin 2 (two) times daily  with a meal. Patient taking differently: Inject 40-85 Units into the skin 2 (two) times daily with a meal. Sliding scale 05/09/21   Antonieta Pert, MD  ondansetron (ZOFRAN) 8 MG tablet Take 8 mg by mouth every 8 (eight) hours as needed for nausea or vomiting.    [provider]  Polyethylene Glycol 400 (BLINK TEARS) 0.25 % SOLN Place 1 drop into both eyes daily as needed (dry eyes).    [provider]  PROAIR RESPICLICK 123XX123 (90 Base) MCG/ACT AEPB Inhale 2 puffs into the lungs every 6 (six) hours as needed (sob/wheezing). 04/03/21   [provider]  Probiotic Product (ALIGN) 4 MG CAPS Take 4 mg by mouth daily.    [provider]  simvastatin (ZOCOR) 10 MG tablet Take 1 tablet (10 mg total) by mouth daily at 6 PM. 08/13/21 09/12/21  Darliss Cheney, MD  sodium chloride (MURO 128) 5 % ophthalmic ointment Place 1 application into both eyes at bedtime.    [provider]    Allergies    Metoclopramide, Nsaids, Amoxicillin-pot clavulanate, Statins, Allopurinol, Empagliflozin, and Tiotropium  Review of Systems   Review of Systems  Constitutional:  Positive for fatigue. Negative for fever.  HENT:  Negative for sore throat.   Eyes:  Negative for visual disturbance.  Respiratory:  Positive for cough and shortness of breath.   Cardiovascular:  Positive for chest pain.  Gastrointestinal:  Positive for abdominal pain, constipation, nausea and vomiting. Negative for diarrhea.  Genitourinary:  Negative for difficulty urinating and dysuria.  Musculoskeletal:  Negative for back pain and neck stiffness.  Skin:  Negative for rash.  Neurological:  Negative for syncope and headaches.   Physical Exam Updated Vital Signs BP (!) 142/70    Pulse 64    Temp 97.6 F (36.4 C) (Oral)    Resp 16    Ht 6\' 1"  (1.854 m)    Wt 123.8 kg    SpO2 96%    BMI 36.02 kg/m   Physical Exam Vitals and nursing note reviewed.  Constitutional:      General: He is not in acute distress.     Appearance: He is well-developed. He is not diaphoretic.  HENT:     Head: Normocephalic and atraumatic.  Eyes:     Conjunctiva/sclera: Conjunctivae normal.  Cardiovascular:     Rate and Rhythm: Normal rate and regular rhythm.     Heart sounds: Normal heart sounds. No murmur heard.   No friction rub. No gallop.  Pulmonary:  Effort: Pulmonary effort is normal. No respiratory distress.     Breath sounds: Normal breath sounds. No wheezing or rales.  Abdominal:     General: There is distension.     Palpations: Abdomen is soft.     Tenderness: There is generalized abdominal tenderness and tenderness in the left lower quadrant. There is no guarding.  Musculoskeletal:     Cervical back: Normal range of motion.  Skin:    General: Skin is warm and dry.  Neurological:     Mental Status: He is alert and oriented to person, place, and time.    ED Results / Procedures / Treatments   Labs (all labs ordered are listed, but only abnormal results are displayed) Labs Reviewed  CBC WITH DIFFERENTIAL/PLATELET - Abnormal; Notable for the following components:      Result Value   RDW 15.6 (*)    All other components within normal limits  COMPREHENSIVE METABOLIC PANEL - Abnormal; Notable for the following components:   Glucose, Bld 217 (*)    BUN 35 (*)    Creatinine, Ser 1.62 (*)    GFR, Estimated 45 (*)    All other components within normal limits  TROPONIN I (HIGH SENSITIVITY) - Abnormal; Notable for the following components:   Troponin I (High Sensitivity) 186 (*)    All other components within normal limits  RESP PANEL BY RT-PCR (FLU A&B, COVID) ARPGX2  URINALYSIS, ROUTINE W REFLEX MICROSCOPIC  LIPASE, BLOOD    EKG EKG Interpretation  Date/Time:  Sunday August 31 2021 13:33:56 EST Ventricular Rate:  66 PR Interval:  182 QRS Duration: 104 QT Interval:  374 QTC Calculation: 392 R Axis:   -52 Text Interpretation: Sinus rhythm Left anterior fascicular block Abnormal R-wave  progression, late transition Nonspecific repol abnormality, diffuse leads No significant change since last tracing Confirmed by Alyxander Kollmann ([id number]) on 08/31/2021 1:42:33 PM  Radiology No results found.  Procedures Procedures   Medications Ordered in ED Medications  iohexol (OMNIPAQUE) 300 MG/ML solution 100 mL (has no administration in time range)    ED Course  I have reviewed the triage vital signs and the nursing notes.  Pertinent labs & imaging results that were available during my care of the patient were reviewed by me and considered in my medical decision making (see chart for details).    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   DDx includes appendicitis, pancreatitis, cholangitis, choledocolithiasis, pyelonephritis, nephrolithiasis, diverticulitis, SBO, AAA, ACS.  Similar presentation to prior admission per patient/family.  Troponin elevated to 186 (previously was 190 and increased to 700s during admission)- will consult Dr. Terri Skains of Cardiology given elevation, recent discussions of medical management vs multivessel PCI for known CAD.   Suspect recommendations may change depending on work up for other abnormalities.    CT abdomen pelvis ordered to evaluate for SBO or diverticulitis.  Will hold on heparin at this time given pending CT findings, will see trend of troponin, is also on eliquis.   CT pending at time of transfer of care.  Final Clinical Impression(s) / ED Diagnoses Final diagnoses:  Generalized abdominal pain  Nausea and  vomiting, unspecified vomiting type  Elevated troponin  Chest pain, unspecified type    Rx / DC Orders ED Discharge Orders     None        Gareth Morgan, MD 08/31/21 1450

## 2021-09-01 DIAGNOSIS — K862 Cyst of pancreas: Secondary | ICD-10-CM | POA: Diagnosis present

## 2021-09-01 DIAGNOSIS — E871 Hypo-osmolality and hyponatremia: Secondary | ICD-10-CM | POA: Diagnosis present

## 2021-09-01 DIAGNOSIS — Q399 Congenital malformation of esophagus, unspecified: Secondary | ICD-10-CM | POA: Diagnosis not present

## 2021-09-01 DIAGNOSIS — N1831 Chronic kidney disease, stage 3a: Secondary | ICD-10-CM | POA: Diagnosis present

## 2021-09-01 DIAGNOSIS — I3139 Other pericardial effusion (noninflammatory): Secondary | ICD-10-CM | POA: Diagnosis present

## 2021-09-01 DIAGNOSIS — I21A1 Myocardial infarction type 2: Secondary | ICD-10-CM | POA: Diagnosis present

## 2021-09-01 DIAGNOSIS — R112 Nausea with vomiting, unspecified: Secondary | ICD-10-CM | POA: Diagnosis not present

## 2021-09-01 DIAGNOSIS — E1122 Type 2 diabetes mellitus with diabetic chronic kidney disease: Secondary | ICD-10-CM | POA: Diagnosis present

## 2021-09-01 DIAGNOSIS — N179 Acute kidney failure, unspecified: Secondary | ICD-10-CM | POA: Diagnosis present

## 2021-09-01 DIAGNOSIS — Z20822 Contact with and (suspected) exposure to covid-19: Secondary | ICD-10-CM | POA: Diagnosis present

## 2021-09-01 DIAGNOSIS — I5032 Chronic diastolic (congestive) heart failure: Secondary | ICD-10-CM | POA: Diagnosis present

## 2021-09-01 DIAGNOSIS — R778 Other specified abnormalities of plasma proteins: Secondary | ICD-10-CM | POA: Diagnosis not present

## 2021-09-01 DIAGNOSIS — I214 Non-ST elevation (NSTEMI) myocardial infarction: Secondary | ICD-10-CM | POA: Diagnosis present

## 2021-09-01 DIAGNOSIS — E1169 Type 2 diabetes mellitus with other specified complication: Secondary | ICD-10-CM | POA: Diagnosis present

## 2021-09-01 DIAGNOSIS — E669 Obesity, unspecified: Secondary | ICD-10-CM | POA: Diagnosis present

## 2021-09-01 DIAGNOSIS — K3184 Gastroparesis: Secondary | ICD-10-CM | POA: Diagnosis present

## 2021-09-01 DIAGNOSIS — K219 Gastro-esophageal reflux disease without esophagitis: Secondary | ICD-10-CM | POA: Diagnosis present

## 2021-09-01 DIAGNOSIS — Z8616 Personal history of COVID-19: Secondary | ICD-10-CM | POA: Diagnosis not present

## 2021-09-01 DIAGNOSIS — K257 Chronic gastric ulcer without hemorrhage or perforation: Secondary | ICD-10-CM | POA: Diagnosis present

## 2021-09-01 DIAGNOSIS — K567 Ileus, unspecified: Secondary | ICD-10-CM | POA: Diagnosis present

## 2021-09-01 DIAGNOSIS — J45909 Unspecified asthma, uncomplicated: Secondary | ICD-10-CM | POA: Diagnosis present

## 2021-09-01 DIAGNOSIS — Z6835 Body mass index (BMI) 35.0-35.9, adult: Secondary | ICD-10-CM | POA: Diagnosis not present

## 2021-09-01 DIAGNOSIS — I700 Atherosclerosis of aorta: Secondary | ICD-10-CM | POA: Diagnosis present

## 2021-09-01 DIAGNOSIS — E1143 Type 2 diabetes mellitus with diabetic autonomic (poly)neuropathy: Secondary | ICD-10-CM | POA: Diagnosis present

## 2021-09-01 DIAGNOSIS — I25110 Atherosclerotic heart disease of native coronary artery with unstable angina pectoris: Secondary | ICD-10-CM | POA: Diagnosis present

## 2021-09-01 DIAGNOSIS — I130 Hypertensive heart and chronic kidney disease with heart failure and stage 1 through stage 4 chronic kidney disease, or unspecified chronic kidney disease: Secondary | ICD-10-CM | POA: Diagnosis present

## 2021-09-01 DIAGNOSIS — K2970 Gastritis, unspecified, without bleeding: Secondary | ICD-10-CM | POA: Diagnosis not present

## 2021-09-01 DIAGNOSIS — K30 Functional dyspepsia: Secondary | ICD-10-CM | POA: Diagnosis not present

## 2021-09-01 DIAGNOSIS — E781 Pure hyperglyceridemia: Secondary | ICD-10-CM | POA: Diagnosis present

## 2021-09-01 DIAGNOSIS — R1114 Bilious vomiting: Secondary | ICD-10-CM | POA: Diagnosis not present

## 2021-09-01 DIAGNOSIS — R1084 Generalized abdominal pain: Secondary | ICD-10-CM

## 2021-09-01 DIAGNOSIS — K259 Gastric ulcer, unspecified as acute or chronic, without hemorrhage or perforation: Secondary | ICD-10-CM | POA: Diagnosis not present

## 2021-09-01 DIAGNOSIS — K317 Polyp of stomach and duodenum: Secondary | ICD-10-CM | POA: Diagnosis not present

## 2021-09-01 DIAGNOSIS — I252 Old myocardial infarction: Secondary | ICD-10-CM | POA: Diagnosis not present

## 2021-09-01 LAB — CBC
HCT: 39.5 % (ref 39.0–52.0)
Hemoglobin: 12.6 g/dL — ABNORMAL LOW (ref 13.0–17.0)
MCH: 30.4 pg (ref 26.0–34.0)
MCHC: 31.9 g/dL (ref 30.0–36.0)
MCV: 95.2 fL (ref 80.0–100.0)
Platelets: 221 10*3/uL (ref 150–400)
RBC: 4.15 MIL/uL — ABNORMAL LOW (ref 4.22–5.81)
RDW: 15.3 % (ref 11.5–15.5)
WBC: 8.3 10*3/uL (ref 4.0–10.5)
nRBC: 0 % (ref 0.0–0.2)

## 2021-09-01 LAB — BASIC METABOLIC PANEL
Anion gap: 8 (ref 5–15)
BUN: 28 mg/dL — ABNORMAL HIGH (ref 8–23)
CO2: 26 mmol/L (ref 22–32)
Calcium: 9.4 mg/dL (ref 8.9–10.3)
Chloride: 102 mmol/L (ref 98–111)
Creatinine, Ser: 1.58 mg/dL — ABNORMAL HIGH (ref 0.61–1.24)
GFR, Estimated: 46 mL/min — ABNORMAL LOW (ref >60)
Glucose, Bld: 169 mg/dL — ABNORMAL HIGH (ref 70–99)
Potassium: 4.6 mmol/L (ref 3.5–5.1)
Sodium: 136 mmol/L (ref 135–145)

## 2021-09-01 LAB — TROPONIN I (HIGH SENSITIVITY)
Troponin I (High Sensitivity): 692 ng/L (ref <18)
Troponin I (High Sensitivity): 934 ng/L (ref <18)

## 2021-09-01 LAB — APTT
aPTT: 63 seconds — ABNORMAL HIGH (ref 24–36)
aPTT: 37 seconds — ABNORMAL HIGH (ref 24–36)

## 2021-09-01 LAB — GLUCOSE, CAPILLARY
Glucose-Capillary: 170 mg/dL — ABNORMAL HIGH (ref 70–99)
Glucose-Capillary: 165 mg/dL — ABNORMAL HIGH (ref 70–99)
Glucose-Capillary: 186 mg/dL — ABNORMAL HIGH (ref 70–99)
Glucose-Capillary: 110 mg/dL — ABNORMAL HIGH (ref 70–99)

## 2021-09-01 LAB — HEPARIN LEVEL (UNFRACTIONATED)
Heparin Unfractionated: >1.1 IU/mL — ABNORMAL HIGH (ref 0.30–0.70)
Heparin Unfractionated: >1.1 IU/mL — ABNORMAL HIGH (ref 0.30–0.70)
Heparin Unfractionated: >1.1 IU/mL — ABNORMAL HIGH (ref 0.30–0.70)

## 2021-09-01 LAB — BRAIN NATRIURETIC PEPTIDE: B Natriuretic Peptide: 146.8 pg/mL — ABNORMAL HIGH (ref 0.0–100.0)

## 2021-09-01 MED ORDER — CARVEDILOL 3.125 MG PO TABS
3.1250 mg | ORAL_TABLET | Freq: Two times a day (BID) | ORAL | Status: DC
  Administered 2021-09-01 – 2021-09-07 (×10): 3.125 mg via ORAL
  Filled 2021-09-01 (×12): qty 1

## 2021-09-01 MED ORDER — APIXABAN 5 MG PO TABS
5.0000 mg | ORAL_TABLET | Freq: Two times a day (BID) | ORAL | Status: DC
  Administered 2021-09-01 – 2021-09-07 (×11): 5 mg via ORAL
  Filled 2021-09-01 (×11): qty 1

## 2021-09-01 NOTE — Consult Note (Signed)
WOC Nurse Consult Note: Patient receiving care in [facility] [phone] Reason for Consult: cyst on right shoulder Wound type: Sebaceous cyst on the left mid upper back that was drained previously and the wife has been taking care of the wound at home. We will continue present wound care as follows: Clean the left mid upper back wound with NS. Pack with moistened plain gauze packing strip [facility] [place] # [phone]) and cover with a foam dressing. Change packing daily. Pressure Injury POA:NA Measurement: 2.5 cm x 1 cm x 0.5 cm Wound bed: beefy red with serosanguinous drainage on the packing strip removed.   Monitor the wound area(s) for worsening of condition such as: Signs/symptoms of infection, increase in size, development of or worsening of odor, development of pain, or increased pain at the affected locations.   Notify the medical team if any of these develop.  Thank you for the consult. WOC nurse will not follow at this time.   Please re-consult the WOC team if needed.  Renaldo Reel Katrinka Blazing, MSN, RN, CMSRN, Angus Seller, [facility] Wound Treatment Associate Pager [phone]

## 2021-09-01 NOTE — Progress Notes (Addendum)
PROGRESS NOTE    Engel Petrossian  [id number] DOB: February 16, 1951 DOA: 08/31/2021 PCP: [name], MD   Chief Complain: Nausea, vomiting, chest pain  Brief Narrative: Patient is a 70 year old male with history of diastolic congestive heart failure, diabetes type 2, hyperlipidemia, GERD, DVT, PE on anticoagulation who presented with complaints of abdominal pain, chest pain, nausea, vomiting.  He was recently admitted at [facility], found to have elevated troponin at the time and underwent left heart cath which showed multivessel coronary artery disease for which she was recommended medical management.  He multiple episodes of nausea, vomiting, chest pressure at home.  On presentation his troponins were gradually increasing so he was transferred to [facility] for further evaluation.  Cardiology consulted and following.  Currently on heparin drip.  Plan is to continue medical management, continue heparin drip for 48 hours and discharged to  home.  Assessment & Plan:   Principal Problem:   NSTEMI (non-ST elevated myocardial infarction) ([place]) Active Problems:   Type 2 diabetes mellitus with hyperlipidemia (HCC)   Nausea and vomiting   Coronary artery disease involving native coronary artery of native heart with unstable angina pectoris (HCC)   CKD (chronic kidney disease) stage 3, GFR 30-59 ml/min ([place])   Chest pressure/NSTEMI: Presented with chest pressure, abdominal symptoms.  Elevated troponin at presentation.  Plan for medical management. Denies any chest pain today.  Continue heparin drip , plan for 48 hours.  Continue as needed nitroglycerin for chest pain  Coronary artery disease: Last admission cardiac cath showed multivessel coronary disease, medical management has been recommended.  Cardiology following here.  On carvedilol, vascepa, losartan.  History of statin intolerance  History of diastolic congestive heart failure: Currently euvolemic, mild elevated BNP.  Continue current  medications  Diabetes type 2: Continue current insulin regimen.  Monitor blood sugars  CKD stage IIIa: Continue function at baseline.  Follows with nephrology at [facility]  Hypertension: Takes carvedilol, losartan at home.  Currently blood pressure soft.  Dose of carvedilol decreased to 3.125 mg twice daily  Nausea/vomiting: Resolved  Debility/deconditioning: Chronically bedbound, nonambulatory due to severe neuropathy.  Requested for PT/OT evaluation  Morbid obesity: BMI 35.5  Recent history of infected sebaceous cyst excision lesion: Wound examined at the bedside.  Appears noninfected, clean.  Wound care has been following.  No antibiotics indicated for now.           DVT prophylaxis: Heparin IV Code Status: Full code Family Communication: Wife at the bedside Patient status: Inpatient  Dispo: The patient is from: Home              Anticipated d/c is to: Home              Anticipated d/c date is: Tomorrow  Consultants: Cardiology  Procedures: None  Antimicrobials:  Anti-infectives (From admission, onward)    None       Subjective: Patient seen and examined at the bedside this morning.  Hemodynamically stable comfortable.  Lying on bed.  Denies chest pain, nausea or vomiting.  Wife at the bedside.  No new complaints today  Objective: Vitals:   09/01/21 0900 09/01/21 1000 09/01/21 1100 09/01/21 1135  BP:    99/62  Pulse: 75 66 66 68  Resp:    18  Temp:    97.6 F (36.4 C)  TempSrc:    Oral  SpO2: 95% 97% 96% 97%  Weight:      Height:        Intake/Output Summary (Last  24 hours) at 09/01/2021 1323 Last data filed at 09/01/2021 9702 Gross per 24 hour  Intake 325.27 ml  Output --  Net 325.27 ml   Filed Weights   08/31/21 1327 08/31/21 2300  Weight: 123.8 kg 122.2 kg    Examination:  General exam: Overall comfortable, not in distress, morbidly obese, deconditioned, chronically ill looking HEENT: PERRL Respiratory system:  no wheezes or  crackles  Cardiovascular system: S1 & S2 heard, RRR.  Gastrointestinal system: Abdomen is nondistended, soft and nontender. Central nervous system: Alert and oriented Extremities: No edema, no clubbing ,no cyanosis Skin: No rashes, no ulcers,no icterus , clean sebaceous cyst excision wound on the back    Data Reviewed: I have personally reviewed following labs and imaging studies  CBC: Recent Labs  Lab 08/31/21 1325 09/01/21 0224  WBC 6.2 8.3  NEUTROABS 4.0  --   HGB 13.4 12.6*  HCT 41.2 39.5  MCV 95.4 95.2  PLT 217 221   Basic Metabolic Panel: Recent Labs  Lab 08/31/21 1325 09/01/21 0224  NA 138 136  K 5.0 4.6  CL 103 102  CO2 26 26  GLUCOSE 217* 169*  BUN 35* 28*  CREATININE 1.62* 1.58*  CALCIUM 9.6 9.4   GFR: Estimated Creatinine Clearance: 57.9 mL/min (A) (by C-G formula based on SCr of 1.58 mg/dL (H)). Liver Function Tests: Recent Labs  Lab 08/31/21 1325  AST 36  ALT 33  ALKPHOS 40  BILITOT 0.5  PROT 7.3  ALBUMIN 3.6   Recent Labs  Lab 08/31/21 1325  LIPASE 27   No results for input(s): AMMONIA in the last 168 hours. Coagulation Profile: Recent Labs  Lab 08/31/21 2044  INR 1.3*   Cardiac Enzymes: No results for input(s): CKTOTAL, CKMB, CKMBINDEX, TROPONINI in the last 168 hours. BNP (last 3 results) No results for input(s): PROBNP in the last 8760 hours. HbA1C: No results for input(s): HGBA1C in the last 72 hours. CBG: Recent Labs  Lab 08/31/21 2329 09/01/21 0746 09/01/21 1131  GLUCAP 170* 170* 165*   Lipid Profile: No results for input(s): CHOL, HDL, LDLCALC, TRIG, CHOLHDL, LDLDIRECT in the last 72 hours. Thyroid Function Tests: No results for input(s): TSH, T4TOTAL, FREET4, T3FREE, THYROIDAB in the last 72 hours. Anemia Panel: No results for input(s): VITAMINB12, FOLATE, FERRITIN, TIBC, IRON, RETICCTPCT in the last 72 hours. Sepsis Labs: No results for input(s): PROCALCITON, LATICACIDVEN in the last 168 hours.  Recent Results  (from the past 240 hour(s))  Resp Panel by RT-PCR (Flu A&B, Covid) Nasopharyngeal Swab     Status: None   Collection Time: 08/31/21  1:46 PM   Specimen: Nasopharyngeal Swab; Nasopharyngeal(NP) swabs in vial transport medium  Result Value Ref Range Status   SARS Coronavirus 2 by RT PCR NEGATIVE NEGATIVE Final    Comment: (NOTE) SARS-CoV-2 target nucleic acids are NOT DETECTED.  The SARS-CoV-2 RNA is generally detectable in upper respiratory specimens during the acute phase of infection. The lowest concentration of SARS-CoV-2 viral copies this assay can detect is 138 copies/mL. A negative result does not preclude SARS-Cov-2 infection and should not be used as the sole basis for treatment or other patient management decisions. A negative result may occur with  improper specimen collection/handling, submission of specimen other than nasopharyngeal swab, presence of viral mutation(s) within the areas targeted by this assay, and inadequate number of viral copies(<138 copies/mL). A negative result must be combined with clinical observations, patient history, and epidemiological information. The expected result is Negative.  Fact Sheet for  Patients:  EntrepreneurPulse.com.au  Fact Sheet for Healthcare Providers:  IncredibleEmployment.be  This test is no t yet approved or cleared by the [place] FDA and  has been authorized for detection and/or diagnosis of SARS-CoV-2 by FDA under an Emergency Use Authorization (EUA). This EUA will remain  in effect (meaning this test can be used) for the duration of the COVID-19 declaration under Section 564(b)(1) of the Act, 21 U.S.C.section 360bbb-3(b)(1), unless the authorization is terminated  or revoked sooner.       Influenza A by PCR NEGATIVE NEGATIVE Final   Influenza B by PCR NEGATIVE NEGATIVE Final    Comment: (NOTE) The Xpert Xpress SARS-CoV-2/FLU/RSV plus assay is intended as an aid in the  diagnosis of influenza from Nasopharyngeal swab specimens and should not be used as a sole basis for treatment. Nasal washings and aspirates are unacceptable for Xpert Xpress SARS-CoV-2/FLU/RSV testing.  Fact Sheet for Patients: EntrepreneurPulse.com.au  Fact Sheet for Healthcare Providers: IncredibleEmployment.be  This test is not yet approved or cleared by the [place] FDA and has been authorized for detection and/or diagnosis of SARS-CoV-2 by FDA under an Emergency Use Authorization (EUA). This EUA will remain in effect (meaning this test can be used) for the duration of the COVID-19 declaration under Section 564(b)(1) of the Act, 21 U.S.C. section 360bbb-3(b)(1), unless the authorization is terminated or revoked.  Performed at [facility], [address], [place], [place] [address]          Radiology Studies: CT ABDOMEN PELVIS W CONTRAST  Result Date: 08/31/2021 CLINICAL DATA:  Left lower quadrant abdominal pain. EXAM: CT ABDOMEN AND PELVIS WITH CONTRAST TECHNIQUE: Multidetector CT imaging of the abdomen and pelvis was performed using the standard protocol following bolus administration of intravenous contrast. CONTRAST:  146mL OMNIPAQUE IOHEXOL 300 MG/ML  SOLN COMPARISON:  CT abdomen and pelvis 08/09/2021. CT abdomen and pelvis 06/10/2019. FINDINGS: Lower chest: There is atelectasis in the lung bases. Small pericardial effusion is unchanged. Hepatobiliary: No focal liver abnormality is seen. Status post cholecystectomy. No biliary dilatation. Pancreas: There is an 11 mm hypodense area in the uncinate process of the pancreas which is stable from 08/09/2021, but new from 06/10/2019. The pancreas is otherwise within normal limits. Spleen: Normal in size without focal abnormality. Adrenals/Urinary Tract: There is an 8.2 cm cyst in the left kidney similar to the prior study. There is a stable 2 cm cyst in the inferior right kidney.  There is no hydronephrosis. Adrenal glands and bladder are within normal limits. Stomach/Bowel: Stomach is within normal limits. Appendix appears normal. No evidence of bowel wall thickening, distention, or inflammatory changes. Vascular/Lymphatic: Aortic atherosclerosis. No enlarged abdominal or pelvic lymph nodes. Reproductive: Prostate is unremarkable. Other: There is a small fat containing left inguinal hernia. There is no ascites. There is mild body wall edema. Musculoskeletal: No acute or significant osseous findings. IMPRESSION: 1. Grossly unchanged 8.2 cm left inferior pole renal cyst. 2. Stable small pericardial effusion. 3. Mild body wall edema. 4. Hypodense lesion in the uncinate process of the pancreas, unchanged from 08/09/2021 and new from 06/10/2019. Recommend follow-up pancreatic MRI. 5. Aortic Atherosclerosis (ICD10-I70.0). Electronically Signed   By: Ronney Asters M.D.   On: 08/31/2021 15:37   DG Chest Portable 1 View  Result Date: 08/31/2021 CLINICAL DATA:  Chest pain. EXAM: PORTABLE CHEST 1 VIEW COMPARISON:  Chest radiograph 08/09/2021 FINDINGS: Interval removal of enteric tube. No focal consolidation, pleural effusion, or pneumothorax. Stable cardiomediastinal silhouette. Aortic calcifications. Slight rightward  deviation of the trachea at the thoracic inlet, consistent with left thyroid goiter demonstrated on prior CT 02/29/2020. IMPRESSION: 1. Interval removal of enteric tube. 2. No acute cardiopulmonary abnormality. 3. Aortic Atherosclerosis (ICD10-I70.0). Electronically Signed   By: Ileana Roup M.D.   On: 08/31/2021 15:28        Scheduled Meds:  aspirin EC  81 mg Oral Daily   carvedilol  12.5 mg Oral BID WC   icosapent Ethyl  1 g Oral BID   insulin aspart  0-15 Units Subcutaneous TID WC & HS   insulin aspart protamine- aspart  40 Units Subcutaneous BID WC   latanoprost  1 drop Left Eye QHS   losartan  25 mg Oral Daily   sodium chloride flush  3 mL Intravenous Q12H    Continuous Infusions:  sodium chloride     heparin 1,550 Units/hr (09/01/21 0514)     LOS: 0 days    Time spent:35 mins. More than 50% of that time was spent in counseling and/or coordination of care.      Shelly Coss, MD [facility] P12/19/2022, 1:23 PM

## 2021-09-01 NOTE — Progress Notes (Addendum)
Pt called nurse to bedside complaining of 5/10 right shoulder pain radiating down to his arm. Pt also complained of feeling nauseous. EKG was performed. Given patient 1 dose of Nitroglycerin SL and IV Zofran. After 5 minutes, Pt stated that the pain has subsided and nausea is still there but not as bad. Vital signs are WNL. MD has been notified.

## 2021-09-01 NOTE — Progress Notes (Signed)
Pt has home CPAP unit. No assistance needed.Marland Kitchen

## 2021-09-01 NOTE — Progress Notes (Signed)
ANTICOAGULATION CONSULT NOTE  Pharmacy Consult for Heparin Indication: chest pain/ACS  Allergies  Allergen Reactions   Metoclopramide Itching    Loss of Balance; Urinary Incontinence   Nsaids     Other reaction(s): Other (See Comments) Renal Insufficiency Kidney issues    Amoxicillin-Pot Clavulanate Diarrhea and Nausea And Vomiting    Other reaction(s): Other (See Comments) Abdomen Pain Abdomen pain    Statins     Other reaction(s): Other (See Comments) Myalgias and Myositis myalgias    Allopurinol Rash   Empagliflozin     Other reaction(s): Other (See Comments) Fainting, weakness, lightheaded, falling   Tiotropium     Other reaction(s): Other (See Comments) Urinary retention    Patient Measurements: Height: 6\' 1"  (185.4 cm) Weight: 122.2 kg (269 lb 6.4 oz) IBW/kg (Calculated) : 79.9 Heparin Dosing Weight: 107.1 kg  Vital Signs: Temp: 98.5 F (36.9 C) (12/18 2300) Temp Source: Oral (12/18 2300) BP: 100/53 (12/18 2300) Pulse Rate: 68 (12/18 2300)  Labs: Recent Labs    08/31/21 1325 08/31/21 1338 08/31/21 1809 08/31/21 2044 08/31/21 2323 08/31/21 2358 09/01/21 0224  HGB 13.4  --   --   --   --   --  12.6*  HCT 41.2  --   --   --   --   --  39.5  PLT 217  --   --   --   --   --  221  APTT  --   --   --  33  --   --  63*  LABPROT  --   --   --  16.2*  --   --   --   INR  --   --   --  1.3*  --   --   --   HEPARINUNFRC  --   --   --   --  >1.10*  --  >1.10*  CREATININE 1.62*  --   --   --   --   --  1.58*  TROPONINIHS  --    < > 387*  --   --  692* 934*   < > = values in this interval not displayed.     Estimated Creatinine Clearance: 57.9 mL/min (A) (by C-G formula based on SCr of 1.58 mg/dL (H)).   Medical History: Past Medical History:  Diagnosis Date   Asthma    CHF (congestive heart failure) ([place])    Diabetes mellitus without complication ([place])    Hypertension     Medications:  Medications Prior to Admission  Medication Sig Dispense  Refill Last Dose   acetaminophen (TYLENOL) 500 MG tablet Take 1,000 mg by mouth every 8 (eight) hours as needed for moderate pain.      Alpha-Lipoic Acid 300 MG TABS Take 600 mg by mouth daily.      aspirin EC 81 MG EC tablet Take 1 tablet (81 mg total) by mouth daily. Swallow whole. 30 tablet 0    benzonatate (TESSALON) 200 MG capsule Take 200 mg by mouth every 8 (eight) hours as needed for cough.      bisacodyl (DULCOLAX) 10 MG suppository Place 1 suppository (10 mg total) rectally daily as needed for up to 10 doses for moderate constipation. 10 suppository 0    carvedilol (COREG) 12.5 MG tablet Take 12.5 mg by mouth 2 (two) times daily with a meal.      Cholecalciferol 25 MCG (1000 UT) capsule Take 4,000 Units by mouth daily.  colchicine-probenecid 0.5-500 MG tablet Take 1 tablet by mouth daily.      cyanocobalamin (,VITAMIN B-12,) 1000 MCG/ML injection Inject 1,000 mcg into the skin every 30 (thirty) days.      doxycycline (VIBRA-TABS) 100 MG tablet Take 100 mg by mouth 2 (two) times daily.      ELIQUIS 5 MG TABS tablet Take 1 tablet (5 mg total) by mouth 2 (two) times daily. Resume on 05/11/21 (Patient taking differently: Take 5 mg by mouth 2 (two) times daily.) 60 tablet     Evolocumab (REPATHA SURECLICK) 140 MG/ML SOAJ Inject 1 mL into the skin every 14 (fourteen) days. 2 mL 3    fenofibrate 160 MG tablet Take 160 mg by mouth daily.      fluticasone (FLONASE) 50 MCG/ACT nasal spray Place 1 spray into both nostrils daily.      guaiFENesin (MUCINEX) 600 MG 12 hr tablet Take 600 mg by mouth 2 (two) times daily as needed for cough.      Icosapent Ethyl (VASCEPA) 0.5 g CAPS Take 1 g by mouth 2 (two) times daily. 240 capsule 0    lansoprazole (PREVACID) 30 MG capsule Take 30 mg by mouth in the morning and at bedtime.      latanoprost (XALATAN) 0.005 % ophthalmic solution Place 1 drop into the left eye at bedtime.      losartan (COZAAR) 25 MG tablet Take 1 tablet (25 mg total) by mouth daily.  30 tablet 3    magnesium oxide (MAG-OX) 400 MG tablet Take 400 mg by mouth 2 (two) times daily.      meclizine (ANTIVERT) 25 MG tablet Take 25 mg by mouth 3 (three) times daily as needed for nausea.      metoCLOPramide (REGLAN) 5 MG tablet Take 2.5 mg by mouth at bedtime.      montelukast (SINGULAIR) 10 MG tablet Take 10 mg by mouth at bedtime.      nitroGLYCERIN (NITROSTAT) 0.4 MG SL tablet Place 0.4 mg under the tongue every 5 (five) minutes as needed for chest pain.      NOVOLOG MIX 70/30 FLEXPEN (70-30) 100 UNIT/ML FlexPen Inject 30 Units into the skin 2 (two) times daily with a meal. (Patient taking differently: Inject 40-85 Units into the skin 2 (two) times daily with a meal. Sliding scale) 15 mL 11    ondansetron (ZOFRAN) 8 MG tablet Take 8 mg by mouth every 8 (eight) hours as needed for nausea or vomiting.      Polyethylene Glycol 400 (BLINK TEARS) 0.25 % SOLN Place 1 drop into both eyes daily as needed (dry eyes).      PROAIR RESPICLICK 108 (90 Base) MCG/ACT AEPB Inhale 2 puffs into the lungs every 6 (six) hours as needed (sob/wheezing).      Probiotic Product (ALIGN) 4 MG CAPS Take 4 mg by mouth daily.      simvastatin (ZOCOR) 10 MG tablet Take 1 tablet (10 mg total) by mouth daily at 6 PM. 30 tablet 0    sodium chloride (MURO 128) 5 % ophthalmic ointment Place 1 application into both eyes at bedtime.       Scheduled:  Infusions:  PRN:   Assessment: 70 yom with a history of HFpEF, DM2, HLD, GERD, DVT/PE on anticoagulation. Patient is presenting with abdominal pain. Heparin per pharmacy consult placed for chest pain/ACS.  Patient with a history of small bowel obstruction/partial ileus. However, CT is negative for bowel obstruction.  Patient is on apixaban prior to arrival.  Last dose 12/17pm. Will require aPTT monitoring due to likely falsely high anti-Xa level secondary to DOAC use.  hsTrop 186>>215>>387 Hgb 13.4; plt 215  12/19 AM update:  aPTT below goal Trop rising  (692>>934)  Goal of Therapy:  Heparin level 0.3-0.7 units/ml aPTT 66-102 seconds Monitor platelets by anticoagulation protocol: Yes   Plan:  Inc heparin to 1550 units/hr Check aPTT & anti-Xa level in 8 hours and daily while on heparin Continue to monitor via aPTT until levels are correlated Continue to monitor H&H and platelets  Narda Bonds, PharmD, BCPS Clinical Pharmacist Phone: [phone]

## 2021-09-01 NOTE — Consult Note (Addendum)
CARDIOLOGY CONSULT NOTE  Patient ID: Randy Liu MRN: [id number] DOB/AGE: Jan 21, 1951 70 y.o.  Admit date: 08/31/2021 Referring Physician:  Shelly Coss, MD Primary Physician:  [name], MD Reason for Consultation: NSTEMI  Patient ID: Randy Liu, male    DOB: 1951-07-27, 70 y.o.   MRN: [id number]  Chief Complaint  Patient presents with   Abdominal Pain    Emesis    HPI:    Randy Liu  is a 70 y.o. Insulin-dependent diabetes mellitus type 2, chronic HFpEF, hyperlipidemia (previous statin intolerance), hypertriglyceridemia, GERD, history of PE on oral anticoagulation, predominantly bedbound due to peripheral neuropathy due to diabetes and COVID-19 infection, obesity due to excess calories.   Patient admitted 08/09/2021 - 08/13/2021 when he presented with nausea and vomiting secondary to partial small bowel obstruction.  During hospitalization patient developed chest pain and troponin became elevated, he therefore underwent cardiac catheterization 08/12/2021 for NSTEMI.  Patient was found to have multivessel disease, shared decision at the time was that patient is not a good candidate for invasive management, therefore recommended medical management.  Patient followed up outpatient was recommended to start losartan 25 mg daily as well as Repatha.  Subsequently presented to [place] department 08/31/2021 with nausea and vomiting intermittently over the previous 3 days.  However yesterday morning patient developed substernal chest pain which he described as "burning" with a severity of 7/10.  Given concern for small bowel obstruction this prompted patient to seek care in the ED.  CT scan of the abdomen showed no bowel obstruction, however patient reported was elevated initially at 186, and has continued to trend up to 934 this morning.  Patient is seen and examined at bedside with his wife present.  He is resting comfortably in bed.  He has had no  recurrence of chest pain since yesterday morning.  Past Medical History:  Diagnosis Date   Asthma    CHF (congestive heart failure) ([place])    Diabetes mellitus without complication ([place])    Hypertension    Past Surgical History:  Procedure Laterality Date   BIOPSY  12/14/2020   Procedure: BIOPSY;  Surgeon: Jackquline Denmark, MD;  Location: [facility];  Service: Endoscopy;;   CHOLECYSTECTOMY N/A 12/17/2020   Procedure: LAPAROSCOPIC CHOLECYSTECTOMY, LIVER BIOPSY, PRIMARY UMBILICAL HERNIA REPAIR;  Surgeon: Armandina Gemma, MD;  Location: [facility];  Service: General;  Laterality: N/A;   COLONOSCOPY WITH PROPOFOL N/A 05/08/2021   Procedure: COLONOSCOPY WITH PROPOFOL;  Surgeon: Carol Ada, MD;  [facility];  Service: Endoscopy;  Laterality: N/A;   ENDOSCOPIC RETROGRADE CHOLANGIOPANCREATOGRAPHY (ERCP) WITH PROPOFOL N/A 12/14/2020   Procedure: ENDOSCOPIC RETROGRADE CHOLANGIOPANCREATOGRAPHY (ERCP) WITH PROPOFOL;  Surgeon: Jackquline Denmark, MD;  [facility];  Service: Endoscopy;  Laterality: N/A;   LEFT HEART CATH AND CORONARY ANGIOGRAPHY N/A 08/12/2021   Procedure: LEFT HEART CATH AND CORONARY ANGIOGRAPHY;  Surgeon: Adrian Prows, MD;  Location: [facility];  Service: Cardiovascular;  Laterality: N/A;   POLYPECTOMY  05/08/2021   Procedure: POLYPECTOMY;  Surgeon: Carol Ada, MD;  [facility];  Service: Endoscopy;;   REMOVAL OF STONES  12/14/2020   Procedure: REMOVAL OF STONES;  Surgeon: Jackquline Denmark, MD;  [facility];  Service: Endoscopy;;   SPHINCTEROTOMY  12/14/2020   Procedure: Joan Mayans;  Surgeon: Jackquline Denmark, MD;  [facility];  Service: Endoscopy;;   History reviewed. No pertinent family history. Social History   Tobacco Use   Smoking status: Former   Smokeless tobacco: Never  Substance Use Topics  Alcohol use: Not Currently    Marital Sttus: Married  ROS  Review of Systems  Constitutional: Negative for malaise/fatigue and weight  gain.  HENT: Negative.    Cardiovascular:  Positive for chest pain (now resolved). Negative for claudication, leg swelling, near-syncope, orthopnea, palpitations, paroxysmal nocturnal dyspnea and syncope.  Respiratory:  Negative for shortness of breath.   Endocrine: Negative.   Hematologic/Lymphatic: Negative.   Skin: Negative.   Musculoskeletal: Negative.   Gastrointestinal:  Positive for nausea (now resolved) and vomiting (now resolved).  Genitourinary: Negative.   Neurological:  Negative for dizziness.  Allergic/Immunologic: Negative.   Objective   Vitals with BMI 09/01/2021 08/31/2021 08/31/2021  Height - 6\' 1"  -  Weight - 269 lbs 6 oz -  BMI - [id number] -  Systolic 0000000 [id number]  Diastolic 65 53 67  Pulse 70 68 -    Blood pressure 125/65, pulse 70, temperature (!) 97.5 F (36.4 C), temperature source Oral, resp. rate 18, height 6\' 1"  (1.854 m), weight 122.2 kg, SpO2 95 %.    Physical Exam Vitals reviewed.  Constitutional:      Appearance: He is obese.  HENT:     Head: Normocephalic and atraumatic.     Right Ear: External ear normal.     Left Ear: External ear normal.     Nose: Nose normal.     Mouth/Throat:     Mouth: Mucous membranes are moist.  Eyes:     Extraocular Movements: Extraocular movements intact.     Conjunctiva/sclera: Conjunctivae normal.  Neck:     Vascular: No carotid bruit.  Cardiovascular:     Rate and Rhythm: Normal rate and regular rhythm.     Pulses: Intact distal pulses.     Heart sounds: S1 normal and S2 normal. No murmur heard.   No gallop.  Pulmonary:     Effort: Pulmonary effort is normal. No respiratory distress.     Breath sounds: No wheezing, rhonchi or rales.  Abdominal:     General: Bowel sounds are normal. There is no distension.     Tenderness: There is abdominal tenderness (mildly tender to palpation).  Musculoskeletal:     Right lower leg: Edema (trace) present.     Left lower leg: Edema (trace) present.  Skin:    General: Skin  is warm and dry.  Neurological:     General: No focal deficit present.     Mental Status: He is alert and oriented to person, place, and time.   Laboratory examination:   Recent Labs    08/13/21 0354 08/31/21 1325 09/01/21 0224  NA 133* 138 136  K 4.0 5.0 4.6  CL 106 103 102  CO2 20* 26 26  GLUCOSE 193* 217* 169*  BUN 22 35* 28*  CREATININE 1.25* 1.62* 1.58*  CALCIUM 8.8* 9.6 9.4  GFRNONAA >60 45* 46*   estimated creatinine clearance is 57.9 mL/min (A) (by C-G formula based on SCr of 1.58 mg/dL (H)).  CMP Latest Ref Rng & Units 09/01/2021 08/31/2021 08/13/2021  Glucose 70 - 99 mg/dL 169(H) 217(H) 193(H)  BUN 8 - 23 mg/dL 28(H) 35(H) 22  Creatinine 0.61 - 1.24 mg/dL 1.58(H) 1.62(H) 1.25(H)  Sodium 135 - 145 mmol/L 136 138 133(L)  Potassium 3.5 - 5.1 mmol/L 4.6 5.0 4.0  Chloride 98 - 111 mmol/L 102 103 106  CO2 22 - 32 mmol/L 26 26 20(L)  Calcium 8.9 - 10.3 mg/dL 9.4 9.6 8.8(L)  Total Protein 6.5 - 8.1 g/dL - 7.3 -  Total Bilirubin 0.3 - 1.2 mg/dL - 0.5 -  Alkaline Phos 38 - 126 U/L - 40 -  AST 15 - 41 U/L - 36 -  ALT 0 - 44 U/L - 33 -   CBC Latest Ref Rng & Units 09/01/2021 08/31/2021 08/12/2021  WBC 4.0 - 10.5 K/uL 8.3 6.2 6.6  Hemoglobin 13.0 - 17.0 g/dL 12.6(L) 13.4 10.9(L)  Hematocrit 39.0 - 52.0 % 39.5 41.2 33.5(L)  Platelets 150 - 400 K/uL 221 217 210   Lipid Panel Recent Labs    08/10/21 0204  CHOL 223*  TRIG 313*  LDLCALC 136*  VLDL 63*  HDL 24*  CHOLHDL 9.3    HEMOGLOBIN A1C Lab Results  Component Value Date   HGBA1C 8.0 (H) 08/09/2021   MPG 183 08/09/2021   TSH No results for input(s): TSH in the last 8760 hours. BNP (last 3 results) Recent Labs    08/09/21 1435  BNP 138.4*   Results for orders placed or performed during the hospital encounter of 08/31/21 (from the past 48 hour(s))  CBC with Differential     Status: Abnormal   Collection Time: 08/31/21  1:25 PM  Result Value Ref Range   WBC 6.2 4.0 - 10.5 K/uL   RBC 4.32 4.22 - 5.81  MIL/uL   Hemoglobin 13.4 13.0 - 17.0 g/dL   HCT 41.2 39.0 - 52.0 %   MCV 95.4 80.0 - 100.0 fL   MCH 31.0 26.0 - 34.0 pg   MCHC 32.5 30.0 - 36.0 g/dL   RDW 15.6 (H) 11.5 - 15.5 %   Platelets 217 150 - 400 K/uL   nRBC 0.0 0.0 - 0.2 %   Neutrophils Relative % 65 %   Neutro Abs 4.0 1.7 - 7.7 K/uL   Lymphocytes Relative 25 %   Lymphs Abs 1.6 0.7 - 4.0 K/uL   Monocytes Relative 7 %   Monocytes Absolute 0.4 0.1 - 1.0 K/uL   Eosinophils Relative 2 %   Eosinophils Absolute 0.2 0.0 - 0.5 K/uL   Basophils Relative 0 %   Basophils Absolute 0.0 0.0 - 0.1 K/uL   Immature Granulocytes 1 %   Abs Immature Granulocytes 0.03 0.00 - 0.07 K/uL    Comment: Performed at [facility], [address] [place], [place] [address]  Comprehensive metabolic panel     Status: Abnormal   Collection Time: 08/31/21  1:25 PM  Result Value Ref Range   Sodium 138 135 - 145 mmol/L   Potassium 5.0 3.5 - 5.1 mmol/L   Chloride 103 98 - 111 mmol/L   CO2 26 22 - 32 mmol/L   Glucose, Bld 217 (H) 70 - 99 mg/dL    Comment: Glucose reference range applies only to samples taken after fasting for at least 8 hours.   BUN 35 (H) 8 - 23 mg/dL   Creatinine, Ser 1.62 (H) 0.61 - 1.24 mg/dL   Calcium 9.6 8.9 - 10.3 mg/dL   Total Protein 7.3 6.5 - 8.1 g/dL   Albumin 3.6 3.5 - 5.0 g/dL   AST 36 15 - 41 U/L   ALT 33 0 - 44 U/L   Alkaline Phosphatase 40 38 - 126 U/L   Total Bilirubin 0.5 0.3 - 1.2 mg/dL   GFR, Estimated 45 (L) >60 mL/min    Comment: (NOTE) Calculated using the CKD-EPI Creatinine Equation (2021)    Anion gap 9 5 - 15    Comment: Performed at [facility], [address] [place], [place] [address]  Urinalysis, Routine w reflex microscopic Urine, Clean Catch     Status: None   Collection Time: 08/31/21  1:25 PM  Result Value Ref Range   Color, Urine YELLOW YELLOW   APPearance CLEAR CLEAR   Specific Gravity, Urine 1.020 1.005 - 1.030   pH 7.0 5.0 - 8.0   Glucose, UA NEGATIVE NEGATIVE  mg/dL   Hgb urine dipstick NEGATIVE NEGATIVE   Bilirubin Urine NEGATIVE NEGATIVE   Ketones, ur NEGATIVE NEGATIVE mg/dL   Protein, ur NEGATIVE NEGATIVE mg/dL   Nitrite NEGATIVE NEGATIVE   Leukocytes,Ua NEGATIVE NEGATIVE    Comment: Microscopic not done on urines with negative protein, blood, leukocytes, nitrite, or glucose < 500 mg/dL. Performed at [facility], [address] [place], [place] [address]   Lipase, blood     Status: None   Collection Time: 08/31/21  1:25 PM  Result Value Ref Range   Lipase 27 11 - 51 U/L    Comment: Performed at [facility], [address] [place], [place] [address]  Troponin I (High Sensitivity)     Status: Abnormal   Collection Time: 08/31/21  1:38 PM  Result Value Ref Range   Troponin I (High Sensitivity) 186 (HH) <18 ng/L    Comment: CRITICAL RESULT CALLED TO, READ BACK BY AND VERIFIED WITH: Gaetano Net, RN AT 1416 ON [id number] BY BOWLBY, J (NOTE) Elevated high sensitivity troponin I (hsTnI) values and significant  changes across serial measurements may suggest ACS but many other  chronic and acute conditions are known to elevate hsTnI results.  Refer to the Links section for chest pain algorithms and additional  guidance. Performed at [facility], [address] [place], [place] [address]   Resp Panel by RT-PCR (Flu A&B, Covid) Nasopharyngeal Swab     Status: None   Collection Time: 08/31/21  1:46 PM   Specimen: Nasopharyngeal Swab; Nasopharyngeal(NP) swabs in vial transport medium  Result Value Ref Range   SARS Coronavirus 2 by RT PCR NEGATIVE NEGATIVE    Comment: (NOTE) SARS-CoV-2 target nucleic acids are NOT DETECTED.  The SARS-CoV-2 RNA is generally detectable in upper respiratory specimens during the acute phase of infection. The lowest concentration of SARS-CoV-2 viral copies this assay can detect is 138 copies/mL. A negative result does not preclude SARS-Cov-2 infection and should not be used as  the sole basis for treatment or other patient management decisions. A negative result may occur with  improper specimen collection/handling, submission of specimen other than nasopharyngeal swab, presence of viral mutation(s) within the areas targeted by this assay, and inadequate number of viral copies(<138 copies/mL). A negative result must be combined with clinical observations, patient history, and epidemiological information. The expected result is Negative.  Fact Sheet for Patients:  BloggerCourse.com  Fact Sheet for Healthcare Providers:  SeriousBroker.it  This test is no t yet approved or cleared by the [place] FDA and  has been authorized for detection and/or diagnosis of SARS-CoV-2 by FDA under an Emergency Use Authorization (EUA). This EUA will remain  in effect (meaning this test can be used) for the duration of the COVID-19 declaration under Section 564(b)(1) of the Act, 21 U.S.C.section 360bbb-3(b)(1), unless the authorization is terminated  or revoked sooner.       Influenza A by PCR NEGATIVE NEGATIVE   Influenza B by PCR NEGATIVE NEGATIVE    Comment: (NOTE) The Xpert Xpress SARS-CoV-2/FLU/RSV plus assay is intended as  an aid in the diagnosis of influenza from Nasopharyngeal swab specimens and should not be used as a sole basis for treatment. Nasal washings and aspirates are unacceptable for Xpert Xpress SARS-CoV-2/FLU/RSV testing.  Fact Sheet for Patients: EntrepreneurPulse.com.au  Fact Sheet for Healthcare Providers: IncredibleEmployment.be  This test is not yet approved or cleared by the [place] FDA and has been authorized for detection and/or diagnosis of SARS-CoV-2 by FDA under an Emergency Use Authorization (EUA). This EUA will remain in effect (meaning this test can be used) for the duration of the COVID-19 declaration under Section 564(b)(1) of the Act, 21  U.S.C. section 360bbb-3(b)(1), unless the authorization is terminated or revoked.  Performed at [facility], [address] [place], [place] [address]   Troponin I (High Sensitivity)     Status: Abnormal   Collection Time: 08/31/21  3:48 PM  Result Value Ref Range   Troponin I (High Sensitivity) 215 (HH) <18 ng/L    Comment: CRITICAL RESULT CALLED TO, READ BACK BY AND VERIFIED WITH:  NANNY, M RN @1616  08/31/21 EDENSCA DELTA CHECK NOTED (NOTE) Elevated high sensitivity troponin I (hsTnI) values and significant  changes across serial measurements may suggest ACS but many other  chronic and acute conditions are known to elevate hsTnI results.  Refer to the Links section for chest pain algorithms and additional  guidance. Performed at [facility], [address] [place], [place] [address]   Troponin I (High Sensitivity)     Status: Abnormal   Collection Time: 08/31/21  6:09 PM  Result Value Ref Range   Troponin I (High Sensitivity) 387 (HH) <18 ng/L    Comment: DELTA CHECK NOTED CRITICAL RESULT CALLED TO, READ BACK BY AND VERIFIED WITH:  NANNY,M RN @1850  08/31/21 EDENSCA (NOTE) Elevated high sensitivity troponin I (hsTnI) values and significant  changes across serial measurements may suggest ACS but many other  chronic and acute conditions are known to elevate hsTnI results.  Refer to the Links section for chest pain algorithms and additional  guidance. Performed at [facility], [address] [place], [place] [address]   APTT     Status: None   Collection Time: 08/31/21  8:44 PM  Result Value Ref Range   aPTT 33 24 - 36 seconds    Comment: Performed at [facility], [address] [place], [place] [address]  Protime-INR     Status: Abnormal   Collection Time: 08/31/21  8:44 PM  Result Value Ref Range   Prothrombin Time 16.2 (H) 11.4 - 15.2 seconds   INR 1.3 (H) 0.8 - 1.2    Comment: (NOTE) INR goal varies based on  device and disease states. Performed at [facility], [address] [place], [place] [address]   Heparin level (unfractionated)     Status: Abnormal   Collection Time: 08/31/21 11:23 PM  Result Value Ref Range   Heparin Unfractionated >1.10 (H) 0.30 - 0.70 IU/mL    Comment: (NOTE) The clinical reportable range upper limit is being lowered to >1.10 to align with the FDA approved guidance for the current laboratory assay.  If heparin results are below expected values, and patient dosage has  been confirmed, suggest follow up testing of antithrombin III levels. Performed at [facility], [place] [address], [place], [place] [address]   Glucose, capillary     Status: Abnormal   Collection Time: 08/31/21 11:29 PM  Result Value  Ref Range   Glucose-Capillary 170 (H) 70 - 99 mg/dL    Comment: Glucose reference range applies only to samples taken after fasting for at least 8 hours.  Troponin I (High Sensitivity)     Status: Abnormal   Collection Time: 08/31/21 11:58 PM  Result Value Ref Range   Troponin I (High Sensitivity) 692 (HH) <18 ng/L    Comment: CRITICAL RESULT CALLED TO, READ BACK BY AND VERIFIED WITH: Aurora Mask, RN [phone] 09/01/21 A GASKINS (NOTE) Elevated high sensitivity troponin I (hsTnI) values and significant  changes across serial measurements may suggest ACS but many other  chronic and acute conditions are known to elevate hsTnI results.  Refer to the Links section for chest pain algorithms and additional  guidance. Performed at [facility], [place] [address], [place], [place] [id number]   Basic metabolic panel     Status: Abnormal   Collection Time: 09/01/21  2:24 AM  Result Value Ref Range   Sodium 136 135 - 145 mmol/L   Potassium 4.6 3.5 - 5.1 mmol/L   Chloride 102 98 - 111 mmol/L   CO2 26 22 - 32 mmol/L   Glucose, Bld 169 (H) 70 - 99 mg/dL    Comment: Glucose reference range applies only to samples taken after fasting for at least 8 hours.    BUN 28 (H) 8 - 23 mg/dL   Creatinine, Ser 1.58 (H) 0.61 - 1.24 mg/dL   Calcium 9.4 8.9 - 10.3 mg/dL   GFR, Estimated 46 (L) >60 mL/min    Comment: (NOTE) Calculated using the CKD-EPI Creatinine Equation (2021)    Anion gap 8 5 - 15    Comment: Performed at [facility] [address], [place], [place] [address]  CBC     Status: Abnormal   Collection Time: 09/01/21  2:24 AM  Result Value Ref Range   WBC 8.3 4.0 - 10.5 K/uL   RBC 4.15 (L) 4.22 - 5.81 MIL/uL   Hemoglobin 12.6 (L) 13.0 - 17.0 g/dL   HCT 39.5 39.0 - 52.0 %   MCV 95.2 80.0 - 100.0 fL   MCH 30.4 26.0 - 34.0 pg   MCHC 31.9 30.0 - 36.0 g/dL   RDW 15.3 11.5 - 15.5 %   Platelets 221 150 - 400 K/uL   nRBC 0.0 0.0 - 0.2 %    Comment: Performed at [facility], [place] [address], [place], [place] [address]  Heparin level (unfractionated)     Status: Abnormal   Collection Time: 09/01/21  2:24 AM  Result Value Ref Range   Heparin Unfractionated >1.10 (H) 0.30 - 0.70 IU/mL    Comment: (NOTE) The clinical reportable range upper limit is being lowered to >1.10 to align with the FDA approved guidance for the current laboratory assay.  If heparin results are below expected values, and patient dosage has  been confirmed, suggest follow up testing of antithrombin III levels. Performed at [facility], [place] [address], [place],  [address]   APTT     Status: Abnormal   Collection Time: 09/01/21  2:24 AM  Result Value Ref Range   aPTT 63 (H) 24 - 36 seconds    Comment:        IF BASELINE aPTT IS ELEVATED, SUGGEST PATIENT RISK ASSESSMENT BE USED TO DETERMINE APPROPRIATE ANTICOAGULANT THERAPY. Performed at [facility], [place] [address] [place], [place] [address]   Troponin I (High Sensitivity)  Status: Abnormal   Collection Time: 09/01/21  2:24 AM  Result Value Ref Range   Troponin I (High Sensitivity) 934 (HH) <18 ng/L    Comment: CRITICAL VALUE NOTED.  VALUE IS CONSISTENT WITH PREVIOUSLY  REPORTED AND CALLED VALUE. (NOTE) Elevated high sensitivity troponin I (hsTnI) values and significant  changes across serial measurements may suggest ACS but many other  chronic and acute conditions are known to elevate hsTnI results.  Refer to the Links section for chest pain algorithms and additional  guidance. Performed at [facility], [place] [address], [place], [place] [address]   Glucose, capillary     Status: Abnormal   Collection Time: 09/01/21  7:46 AM  Result Value Ref Range   Glucose-Capillary 170 (H) 70 - 99 mg/dL    Comment: Glucose reference range applies only to samples taken after fasting for at least 8 hours.    Medications and allergies   Allergies  Allergen Reactions   Metoclopramide Itching    Loss of Balance; Urinary Incontinence   Nsaids     Other reaction(s): Other (See Comments) Renal Insufficiency Kidney issues    Amoxicillin-Pot Clavulanate Diarrhea and Nausea And Vomiting    Other reaction(s): Other (See Comments) Abdomen Pain Abdomen pain    Statins     Other reaction(s): Other (See Comments) Myalgias and Myositis myalgias    Allopurinol Rash   Empagliflozin     Other reaction(s): Other (See Comments) Fainting, weakness, lightheaded, falling   Tiotropium     Other reaction(s): Other (See Comments) Urinary retention     No outpatient medications have been marked as taking for the 08/31/21 encounter [facility] Encounter).    Scheduled Meds:  aspirin EC  81 mg Oral Daily   carvedilol  12.5 mg Oral BID WC   icosapent Ethyl  1 g Oral BID   insulin aspart  0-15 Units Subcutaneous TID WC & HS   insulin aspart protamine- aspart  40 Units Subcutaneous BID WC   latanoprost  1 drop Left Eye QHS   losartan  25 mg Oral Daily   sodium chloride flush  3 mL Intravenous Q12H   Continuous Infusions:  sodium chloride     heparin 1,550 Units/hr (09/01/21 0514)   PRN Meds:.sodium chloride, acetaminophen **OR** acetaminophen, nitroGLYCERIN,  ondansetron **OR** ondansetron (ZOFRAN) IV, polyvinyl alcohol, senna-docusate, sodium chloride flush   I/O last 3 completed shifts: In: 85.3 [I.V.:85.3] Out: -  No intake/output data recorded.    Radiology:   CT ABDOMEN PELVIS W CONTRAST 08/31/2021 1. Grossly unchanged 8.2 cm left inferior pole renal cyst.  2. Stable small pericardial effusion.  3. Mild body wall edema.  4. Hypodense lesion in the uncinate process of the pancreas, unchanged from 08/09/2021 and new from 06/10/2019. Recommend follow-up pancreatic MRI.  5. Aortic Atherosclerosis (ICD10-I70.0).  DG Chest Portable 08/31/2021 Interval removal of enteric tube. No focal consolidation, pleural effusion, or pneumothorax. Stable cardiomediastinal silhouette. Aortic calcifications. Slight rightward deviation of the trachea at the thoracic inlet, consistent with left thyroid goiter demonstrated on prior CT 02/29/2020.  IMPRESSION:  1. Interval removal of enteric tube.  2. No acute cardiopulmonary abnormality.  3. Aortic Atherosclerosis (ICD10-I70.0).  Cardiac Studies:   Echocardiogram 08/10/2021:  1. Left ventricular ejection fraction, by estimation, is 55 to 60%. The  left ventricle has normal function. Left ventricular endocardial border  not optimally defined to evaluate regional wall motion. Left ventricular  diastolic parameters are  indeterminate 2D images were obtain on 08/09/2021 LVEF 45-50% w/  RWMA  along the anterolateral and inferolateral walls. Definity images were  obtained 08/10/2021 which notes improvement in LVEF 55-60% without  regional wall motion abnormalities.   2. Right ventricular systolic function is normal. The right ventricular  size is normal. Mildly increased right ventricular wall thickness.   3. A small pericardial effusion is present. There is no evidence of  cardiac tamponade.   4. The mitral valve is normal in structure. Trivial mitral valve  regurgitation. No evidence of mitral stenosis.    5. The aortic valve is tricuspid. Aortic valve regurgitation is not  visualized. No aortic stenosis is present.   6. The inferior vena cava is normal in size with <50% respiratory  variability, suggesting right atrial pressure of 8 mmHg.   Left Heart Catheterization 08/12/21:  LV: 113/6, EDP 18 mmHg.  Ao 112/50, mean 75 mmHg.  No pressure gradient across the aortic valve. LM: Large vessel, 10 to 20% stenosis. LAD: Large vessel, it is occluded after giving origin to a small D1.  Mid to distal LAD is supplied by contralateral collaterals from the RCA.  D1 is diffusely diseased and is occluded in the mid to distal segment.  Mid to distal LAD appears to be widely patent with mild disease and a good target for bypass. RI: Small vessel, no significant disease. CX: Large vessel.  Gives origin to large OM1 which is a high-grade 95% proximal stenosis followed by 20 to 30% diffuse mid segment stenosis.  Distal vessel is a good target for bypass.  There is a large OM 2 that is ulcerated and occluded in the proximal and mid segment and appears to be diffusely diseased in the proximal and mid segment.  It is a large vessel and distal vessel appears to be a good target for bypass. RCA: Dominant, large vessel.  Gives origin to 2 PDA branches, proximal PDA branch is smooth and normal.  After this distal RCA has a high-grade 90 to 95% bifurcation stenosis involving the PL branch.  Distal RCA gives excellent collaterals to the LAD.   Impression: Mild increase in LVEDP.  LV gram not performed to conserve contrast.  Multivessel disease, ideally speaking would benefit from CABG.  However patient is essentially bedbound and wheelchair-bound due to obesity, peripheral neuropathy and generalized deconditioning.  Discussed with wife, patient does not appear to be a good candidate for CABG as his recovery would be extremely complicated.  Best option is to proceed with multivessel PCI electively, in spite of two-vessel disease,  EF appears to be preserved.  I would like him to recuperate from his acute renal failure.  I have discussed all the options with patient's wife who is in agreement and favors conservative therapy/PCI over surgical therapy.  50 mL contrast utilized. Patient presently on Eliquis for PE, I have started him on aspirin along with Plavix for now.  EKG: 08/31/2021: Sinus rhythm at a rate of 65 bpm.  Left axis.  Poor R wave progression, cannot exclude anteroseptal infarct old.  Subtle ST depression in lateral leads, unchanged compared to previous EKG 08/10/2021.  08/10/2021: NSR, 73bpm, subtle ST depression high lateral and lateral leads, PRWP, without injury pattern.  Assessment   Randy Liu  is a 70 y.o. Insulin-dependent diabetes mellitus type 2, chronic HFpEF, hyperlipidemia (previous statin intolerance), hypertriglyceridemia, GERD, history of PE on oral anticoagulation, predominantly bedbound due to peripheral neuropathy due to diabetes and COVID-19 infection, obesity due to excess calories.  Presents with nausea and vomiting as well as chest  pain with elevated troponin.  NSTEMI/CAD HFpEF  Hyperlipidemia CKD Stage 3, GFR 30-59 mL/min History of pulmonary embolism   Recommendations:   NSTEMI/CAD Reviewed his angiograms with interventional cardiologist Dr. Virgina Jock who continues to feel patient is a poor candidate for PCI and recommends continued medical management.  Given elevated troponin recommend 48 hours of heparin infusion, then can discontinue and transition patient back to home Eliquis given history of pulmonary embolism.  Troponin elevation likely due to supply demand ischemia in the setting of multivessel CAD and acute GI symptoms Continue carvedilol, Vascepa, losartan.  He has history of statin intolerance, therefore was started on Repatha at last office visit. Will follow up on getting him started outpatient.   HFpEF  Clinically euvolemic and only mildly elevated BNP.   Continue carvedilol, losartan.   Hyperlipidemia History of statin intolerance  Continue Vascepa  Follow up on Repatha initiation outpatient   CKD Stage 3, GFR 30-59 mL/min Relatively stable.  Continue to monitor closely  Patient follows with nephrology outpatient at [facility]   Patient was seen in collaboration with Dr. Terri Skains. He also reviewed patient's chart and examined the patient. Dr. Terri Skains is in agreement of the plan.    Alethia Berthold, PA-C 09/01/2021, 7:55 AM Office: [phone]   ADDENDUM: Patient seen and examined independently. Note reviewed with the Lawerance Cruel, PA and relevant changes were made to her note if needed.  I personally reviewed laboratory data, imaging studies and relevant notes.  I independently examined the patient and formulated the important aspects of the plan.  I have personally discussed the plan with the patient and his wife [name] Comments or changes to the note/plan are indicated below.  Patient's original symptoms were nausea and vomiting which started last Wednesday12/14/2022 which they have been monitoring and addressing at home.  Until yesterday he started having substernal discomfort and came to [facility] for further evaluation and management.  Cardiology was asked to consult given his history of CAD as per the recent angiography, elevated troponin levels, and his presentation.  Patient has been asymptomatic since arrival to [facility].  Hemodynamics remained relatively stable.  EKG shows sinus rhythm without underlying injury pattern.  High sensitive troponins have up trended.  Currently on IV heparin drip resting comfortably.  My Exam:  Vitals with BMI 09/01/2021 09/01/2021 09/01/2021  Height - - -  Weight - - -  BMI - - -  Systolic [id number]  Diastolic 59 49 59  Pulse 61 87 62    CONSTITUTIONAL: Appears older than stated age, hemodynamically stable, no acute distress.   SKIN: Skin is warm and dry.  No rash noted. No cyanosis. No pallor. No jaundice HEAD: Normocephalic and atraumatic.  EYES: No scleral icterus MOUTH/THROAT: Dry oral membranes.  NECK: Difficult to evaluate JVP due to short neck stature and adipose tissue.  No thyromegaly noted. No carotid bruits  LYMPHATIC: No visible cervical adenopathy.  CHEST Normal respiratory effort. No intercostal retractions  LUNGS: Clear to auscultation bilaterally.  No stridor. No wheezes. No rales.  CARDIOVASCULAR: Regular rate and rhythm, positive S1-S2, no murmurs rubs or gallops appreciated. ABDOMINAL: Obese, soft, nontender, nondistended, positive bowel sounds in all 4 quadrants, no apparent ascites.  EXTREMITIES: Trace bilateral edema, warm to touch, no peripheral edema  HEMATOLOGIC: No significant bruising NEUROLOGIC: Oriented to person, place, and time. Nonfocal. Normal muscle tone.  PSYCHIATRIC: Normal mood and affect. Normal behavior. Cooperative  08/01/2021: NSR, 64 bpm, consider lateral ischemia, without  underlying injury pattern.  Assessment & Plan:  Non-STEMI: Had a long discussion with the patient and his wife [name] during morning rounds.  Patient has been having GI symptoms since last week which has resulted into intermittent nausea and vomiting.  They decided to seek medical attention yesterday when he started experiencing substernal discomfort.  Since arrival to the ED patient underwent CT of the abdomen which did not show any acute pathology.  Given the patient's multivessel CAD, cardiovascular risk factors, and high sensitive troponins trending up patient was transferred to [facility] for further evaluation and management.  Clinically he is not having chest pain and EKG does not show myocardial injury pattern.  I suspect that he does have underlying supply demand ischemia given the degree of CAD at baseline.  Since the patient is predominantly bedbound he would not be an ideal surgical candidate.  Patient's films  have been reviewed by interventional cardiology again during this hospitalization and it is felt that given his presentation medical management would be superior to percutaneous interventions.  Recommend 48-hour of IV heparin drip, close monitoring for dysrhythmias on telemetry, up titration of GDMT and antianginal therapy as hemodynamics and laboratory values allow, continue Repatha as outpatient and Vascepa.  [name] is agreeable with the plan of care and is thankful.  Echocardiogram less than 1 month ago notes LVEF 55 to 60% without any significant valvular heart disease.  Given his recurrent GI symptoms and insulin-dependent diabetes mellitus type 2.  Recommend being evaluated for gastroparesis.  Plan of care discussed with attending physician as well.  We will follow the patient with you.  Time Spent with Patient: I have spent a total of 84 minutes with patient reviewing hospital notes, telemetry, EKGs, labs and examining the patient as well as establishing an assessment and plan that was discussed with the patient.  Complex decision making given his multivessel CAD, discussing treatment options with his wife [name], reviewing films with interventional cardiologist Dr. Virgina Jock. 50% of time was spent in direct patient care.  This note was created using a voice recognition software as a result there may be grammatical errors inadvertently enclosed that do not reflect the nature of this encounter. Every attempt is made to correct such errors.  Signed, Mechele Claude [facility]  Pager: [phone] Office: [phone] 09/01/2021 6:08 PM

## 2021-09-01 NOTE — Progress Notes (Signed)
°  Transition of Care [facility]) Screening Note   Patient Details  Name: Randy Liu Date of Birth: Feb 25, 1951   Transition of Care [facility]) CM/SW Contact:    Delilah Shan, LCSWA Phone Number: 09/01/2021, 4:30 PM    Transition of Care Department [facility]) has reviewed patient and no TOC needs have been identified at this time. We will continue to monitor patient advancement through interdisciplinary progression rounds. If new patient transition needs arise, please place a TOC consult.

## 2021-09-02 DIAGNOSIS — K2970 Gastritis, unspecified, without bleeding: Secondary | ICD-10-CM | POA: Diagnosis not present

## 2021-09-02 DIAGNOSIS — I2510 Atherosclerotic heart disease of native coronary artery without angina pectoris: Secondary | ICD-10-CM

## 2021-09-02 DIAGNOSIS — Z7401 Bed confinement status: Secondary | ICD-10-CM

## 2021-09-02 DIAGNOSIS — K3184 Gastroparesis: Secondary | ICD-10-CM | POA: Diagnosis not present

## 2021-09-02 DIAGNOSIS — I130 Hypertensive heart and chronic kidney disease with heart failure and stage 1 through stage 4 chronic kidney disease, or unspecified chronic kidney disease: Secondary | ICD-10-CM

## 2021-09-02 DIAGNOSIS — K317 Polyp of stomach and duodenum: Secondary | ICD-10-CM

## 2021-09-02 DIAGNOSIS — N183 Chronic kidney disease, stage 3 unspecified: Secondary | ICD-10-CM

## 2021-09-02 DIAGNOSIS — Z794 Long term (current) use of insulin: Secondary | ICD-10-CM

## 2021-09-02 DIAGNOSIS — K259 Gastric ulcer, unspecified as acute or chronic, without hemorrhage or perforation: Secondary | ICD-10-CM

## 2021-09-02 DIAGNOSIS — R112 Nausea with vomiting, unspecified: Secondary | ICD-10-CM

## 2021-09-02 LAB — GLUCOSE, CAPILLARY
Glucose-Capillary: 156 mg/dL — ABNORMAL HIGH (ref 70–99)
Glucose-Capillary: 107 mg/dL — ABNORMAL HIGH (ref 70–99)
Glucose-Capillary: 153 mg/dL — ABNORMAL HIGH (ref 70–99)
Glucose-Capillary: 171 mg/dL — ABNORMAL HIGH (ref 70–99)

## 2021-09-02 LAB — TROPONIN I (HIGH SENSITIVITY)
Troponin I (High Sensitivity): 493 ng/L (ref <18)
Troponin I (High Sensitivity): 580 ng/L (ref <18)

## 2021-09-02 MED ORDER — METOCLOPRAMIDE HCL 5 MG PO TABS
2.5000 mg | ORAL_TABLET | Freq: Every day | ORAL | Status: DC
  Administered 2021-09-02: 21:00:00 2.5 mg via ORAL
  Filled 2021-09-02: qty 1

## 2021-09-02 MED ORDER — GLUCERNA SHAKE PO LIQD
237.0000 mL | Freq: Three times a day (TID) | ORAL | Status: DC
  Administered 2021-09-02 – 2021-09-07 (×7): 237 mL via ORAL

## 2021-09-02 MED ORDER — RANOLAZINE ER 500 MG PO TB12
500.0000 mg | ORAL_TABLET | Freq: Two times a day (BID) | ORAL | Status: DC
  Administered 2021-09-02 – 2021-09-07 (×11): 500 mg via ORAL
  Filled 2021-09-02 (×11): qty 1

## 2021-09-02 MED ORDER — PANTOPRAZOLE SODIUM 40 MG PO TBEC
40.0000 mg | DELAYED_RELEASE_TABLET | Freq: Every day | ORAL | Status: DC
  Administered 2021-09-02: 12:00:00 40 mg via ORAL
  Filled 2021-09-02: qty 1

## 2021-09-02 MED ORDER — ALUM & MAG HYDROXIDE-SIMETH 200-200-20 MG/5ML PO SUSP
30.0000 mL | ORAL | Status: DC | PRN
  Administered 2021-09-02 (×2): 30 mL via ORAL
  Filled 2021-09-02 (×2): qty 30

## 2021-09-02 NOTE — Consult Note (Signed)
[facility] Consult: 11:31 AM 09/02/2021  LOS: 1 day    Referring Provider: Dr. Tawanna Solo Primary Care Physician:  Burman Freestone, MD Primary Gastroenterologist:  None.  Has been seen as inpatient by [facility], Dr. Benson Norway, Dr. Lyndel Safe.  Outpt visit w Dr Carlton Adam at [facility] in 12/2019.      Reason for Consultation: Chest pain, nausea and vomiting   HPI: Randy Liu is a 70 y.o. male.  IDDM.  Chronic heart failure with preserved EF.  Hyperlipidemia.  Hypertriglyceridemia.  GERD.  History PE.  COVID-19 infection early 2021.  Obesity.   Bed/wheelchair bound due to peripheral neuropathy. GI past hx gastroparesis, cholangitis.  Umbilical hernia repair.  Ileus versus P SBO. 12/13/2020 abdominal ultrasound: Fatty liver.  Gallbladder sludge.  6.5 mm CBD. 12/13/2020 MRI/MRCP: gallbladder and CBD sludge.  CBD 6 to 7 mm.  Normal intrahepatic ducts.  Heterogeneous liver enhancement.  Cystic lesions of the pancreas, largest 12 mm in size, query small sidebranch IPMN's, suggest MR RI/MRCP in 2 years 12/14/2020 ERCP (Dr Lyndel Safe as courtesy for [facility]): Performed sphincterotomy and balloon extraction of CBD stone with observation of a sending cholangitis and periampullary diverticula.  Cystic duct patent, cholelithiasis. 12/17/2020 laparoscopic cholecystectomy and liver biopsy.  Dr Harlow Asa.  Path: Liver with mildly active steatohepatitis hepatitis, grade 1/3 stage I-II/IV of portal and centrilobular fibrosis w fibrous septa.   05/08/2021 colonoscopy.  For hematochezia.  Dr Benson Norway. 15 mm polyp removed at ascending colon, 2 subcentimeter polyps removed from the descending and ascending colon.  Path: Tubular adenomas, benign colonic mucosa, no high-grade dysplasia.  Admission 11/26 -08/13/2021 for abdominal pain, nausea vomiting.  Symptoms  similar to presentation a few years back with SBO.  CTAP showed ileus vs partial SBO.  Symptoms and abnormal x-ray findings resolved with NG tube, able to tolerate regular diet.  Noted to have elevated beta troponins and underwent cardiac cath revealing multivessel disease.  Due to his wheelchair/bedbound status, obesity, deconditioning, neuropathy CABG was not pursued though would be the most effective therapy and plan for possible multivessel PCI as outpatient.  Was started on Plavix, aspirin and Vascepa.  Plavix ultimately discontinued after cards office visit on 12/7.  Wife provides most of the history.  The history of bowel obstruction dates back a couple of years and there was question ileus versus PSBO.  Patient has chronic issues with constipation.  Can go a few days without a bowel movement.  MiraLAX effective but the last time they used it he had copious diarrhea so they have not used it since.  Rectal and p.o. Dulcolax also helpful.  Had not had a bowel movement and was given Dulcolax PR resulting in small bowel movement.  No blood, no melena. Nausea, queasy dizziness, vomiting has been going on for months if not a year or more.  Had a gastric emptying study around 2020 at [facility] confirming gastroparesis.  In the last few weeks, since starting Vascepa, more frequent episodes of nausea with and without vomiting of partially digested food.  Zofran and Antivert are  sometimes helpful in controlling symptoms but when the vomiting gets severe, neither of these is helpful.  Return to the ED 2 days ago with 3 days worsening nausea, vomiting, abdominal pain.  Describes substernal chest pain as burning, 7/10 intensity. CTAP: Shows stable left renal cyst, small, stable pericardial effusion.  Mild body wall edema.  Stable hypodense lesion at pancreatic uncinate. GFR diminished at 45.  Normal sodium.  Normal LFTs, lipase.  Glucose 217. BNP 146.  Troponins 934 .Marland Kitchen. 493 ..  580. WBCs normal.  Hgb  12.6.  Platelets 221.  Overnight patient had left chest and shoulder pain for about an hour, improved with SL NTG, Maalox.  No new associated EKG changes.  Currently cardiology wants him to recover from his acute renal failure before pursuing PCI.  Dr. Rayetta Pigg initiated Ranexa for antianginal therapy  Wife is hoping that any necessary specialty testing can be performed while patient is in the hospital because it is very expensive and difficult to get him to outpatient appointments and procedures.  Current meds include Eliquis bid, last dose this morning.  ASA 81 mg Take Prevacid 30 mg daily, Reglan 5 mg HS, Zofran prn at home,  Many of the patient's close and secondary relatives suffer from stomach issues, nothing specific but they have diarrhea and GI complaints.  No gastrointestinal cancers. Lives at home in [facility].  No alcohol, no cigarettes.    Past Medical History:  Diagnosis Date   Asthma    CHF (congestive heart failure) ([place])    Diabetes mellitus without complication ([place])    Hypertension     Past Surgical History:  Procedure Laterality Date   BIOPSY  12/14/2020   Procedure: BIOPSY;  Surgeon: Jackquline Denmark, MD;  Location: [facility];  Service: Endoscopy;;   CHOLECYSTECTOMY N/A 12/17/2020   Procedure: LAPAROSCOPIC CHOLECYSTECTOMY, LIVER BIOPSY, PRIMARY UMBILICAL HERNIA REPAIR;  Surgeon: Armandina Gemma, MD;  Location: [facility];  Service: General;  Laterality: N/A;   COLONOSCOPY WITH PROPOFOL N/A 05/08/2021   Procedure: COLONOSCOPY WITH PROPOFOL;  Surgeon: Carol Ada, MD;  Location: [facility];  Service: Endoscopy;  Laterality: N/A;   ENDOSCOPIC RETROGRADE CHOLANGIOPANCREATOGRAPHY (ERCP) WITH PROPOFOL N/A 12/14/2020   Procedure: ENDOSCOPIC RETROGRADE CHOLANGIOPANCREATOGRAPHY (ERCP) WITH PROPOFOL;  Surgeon: Jackquline Denmark, MD;  Location: [facility];  Service: Endoscopy;  Laterality: N/A;   LEFT HEART CATH AND CORONARY ANGIOGRAPHY N/A 08/12/2021   Procedure: LEFT HEART CATH AND  CORONARY ANGIOGRAPHY;  Surgeon: Adrian Prows, MD;  Location: [facility];  Service: Cardiovascular;  Laterality: N/A;   POLYPECTOMY  05/08/2021   Procedure: POLYPECTOMY;  Surgeon: Carol Ada, MD;  Location: [facility];  Service: Endoscopy;;   REMOVAL OF STONES  12/14/2020   Procedure: REMOVAL OF STONES;  Surgeon: Jackquline Denmark, MD;  Location: [facility];  Service: Endoscopy;;   SPHINCTEROTOMY  12/14/2020   Procedure: Joan Mayans;  Surgeon: Jackquline Denmark, MD;  Location: [facility];  Service: Endoscopy;;    Prior to Admission medications   Medication Sig Start Date End Date Taking? Authorizing Provider  acetaminophen (TYLENOL) 500 MG tablet Take 1,000 mg by mouth every 8 (eight) hours as needed for moderate pain.   Yes [provider]  Alpha-Lipoic Acid 300 MG TABS Take 600 mg by mouth daily.   Yes [provider]  aspirin EC 81 MG EC tablet Take 1 tablet (81 mg total) by mouth daily. Swallow whole. 08/14/21 09/13/21 Yes Pahwani, Einar Grad, MD  benzonatate (TESSALON) 200 MG capsule Take 200 mg by mouth every 8 (  eight) hours as needed for cough. 11/18/20  Yes [provider]  bisacodyl (DULCOLAX) 10 MG suppository Place 1 suppository (10 mg total) rectally daily as needed for up to 10 doses for moderate constipation. 05/06/21  Yes Antonieta Pert, MD  carvedilol (COREG) 25 MG tablet Take 12.5 mg by mouth 2 (two) times daily with a meal.   Yes [provider]  Cholecalciferol 25 MCG (1000 UT) capsule Take 4,000 Units by mouth daily.   Yes [provider]  colchicine-probenecid 0.5-500 MG tablet Take 1 tablet by mouth daily. 11/21/20  Yes [provider]  cyanocobalamin (,VITAMIN B-12,) 1000 MCG/ML injection Inject 1,000 mcg into the skin every 30 (thirty) days. 09/23/20  Yes [provider]  ELIQUIS 5 MG TABS tablet Take 1 tablet (5 mg total) by mouth 2 (two) times daily. Resume on 05/11/21 Patient taking differently: Take 5 mg by mouth 2  (two) times daily. 05/11/21  Yes Kc, Maren Beach, MD  Evolocumab (REPATHA SURECLICK) [id number] MG/ML SOAJ Inject 1 mL into the skin every 14 (fourteen) days. 08/27/21  Yes Adrian Prows, MD  fenofibrate 160 MG tablet Take 160 mg by mouth daily. 12/04/20  Yes [provider]  fluticasone (FLONASE) 50 MCG/ACT nasal spray Place 1 spray into both nostrils daily. 07/08/21  Yes [provider]  guaiFENesin (MUCINEX) 600 MG 12 hr tablet Take 600 mg by mouth 2 (two) times daily as needed for cough.   Yes [provider]  lansoprazole (PREVACID) 30 MG capsule Take 30 mg by mouth in the morning and at bedtime. 12/02/20  Yes [provider]  latanoprost (XALATAN) 0.005 % ophthalmic solution Place 1 drop into the left eye at bedtime. 10/29/20  Yes [provider]  losartan (COZAAR) 25 MG tablet Take 1 tablet (25 mg total) by mouth daily. 08/20/21 12/18/21 Yes Cantwell, Celeste C, PA-C  magnesium oxide (MAG-OX) 400 MG tablet Take 400 mg by mouth 2 (two) times daily. 10/09/20  Yes [provider]  meclizine (ANTIVERT) 25 MG tablet Take 25 mg by mouth 3 (three) times daily as needed for nausea.   Yes [provider]  metoCLOPramide (REGLAN) 5 MG tablet Take 2.5 mg by mouth at bedtime. 10/09/20  Yes [provider]  montelukast (SINGULAIR) 10 MG tablet Take 10 mg by mouth at bedtime. 09/28/20  Yes [provider]  nitroGLYCERIN (NITROSTAT) 0.4 MG SL tablet Place 0.4 mg under the tongue every 5 (five) minutes as needed for chest pain.   Yes [provider]  NOVOLOG MIX 70/30 FLEXPEN (70-30) 100 UNIT/ML FlexPen Inject 30 Units into the skin 2 (two) times daily with a meal. Patient taking differently: Inject 40-85 Units into the skin 2 (two) times daily with a meal. Sliding scale 05/09/21  Yes Kc, Ramesh, MD  ondansetron (ZOFRAN) 8 MG tablet Take 8 mg by mouth every 8 (eight) hours as needed for nausea or vomiting.   Yes [provider]   Polyethylene Glycol 400 (BLINK TEARS) 0.25 % SOLN Place 1 drop into both eyes daily as needed (dry eyes).   Yes [provider]  PROAIR RESPICLICK 123XX123 (90 Base) MCG/ACT AEPB Inhale 2 puffs into the lungs every 6 (six) hours as needed (sob/wheezing). 04/03/21  Yes [provider]  Probiotic Product (ALIGN) 4 MG CAPS Take 4 mg by mouth daily.   Yes [provider]  simvastatin (ZOCOR) 10 MG tablet Take 1 tablet (10 mg total) by mouth daily at 6 PM. 08/13/21 09/12/21 Yes Pahwani,  Daleen Bo, MD  sodium chloride (MURO 128) 5 % ophthalmic ointment Place 1 application into both eyes at bedtime.   Yes [provider]  VASCEPA 1 g capsule Take 1 g by mouth 2 (two) times daily. 08/13/21  Yes [provider]    Scheduled Meds:  apixaban  5 mg Oral BID   aspirin EC  81 mg Oral Daily   carvedilol  3.125 mg Oral BID WC   icosapent Ethyl  1 g Oral BID   insulin aspart  0-15 Units Subcutaneous TID WC & HS   insulin aspart protamine- aspart  40 Units Subcutaneous BID WC   latanoprost  1 drop Left Eye QHS   ranolazine  500 mg Oral BID   sodium chloride flush  3 mL Intravenous Q12H   Infusions:  sodium chloride     PRN Meds: sodium chloride, acetaminophen **OR** acetaminophen, alum & mag hydroxide-simeth, nitroGLYCERIN, ondansetron **OR** ondansetron (ZOFRAN) IV, polyvinyl alcohol, senna-docusate, sodium chloride flush   Allergies as of 08/31/2021 - Review Complete 08/31/2021  Allergen Reaction Noted   Metoclopramide Itching 08/21/2016   Nsaids  01/22/2014   Amoxicillin-pot clavulanate Diarrhea and Nausea And Vomiting 12/04/2014   Statins  04/19/2015   Allopurinol Rash 09/30/2015   Empagliflozin  06/30/2018   Tiotropium  08/24/2019    History reviewed. No pertinent family history.  Social History   Socioeconomic History   Marital status: Married    Spouse name: Not on file   Number of children: Not on file   Years of education: Not on file   Highest  education level: Not on file  Occupational History   Not on file  Tobacco Use   Smoking status: Former   Smokeless tobacco: Never  Substance and Sexual Activity   Alcohol use: Not Currently   Drug use: Never   Sexual activity: Not on file  Other Topics Concern   Not on file  Social History Narrative   Not on file   Social Determinants of Health   Financial Resource Strain: Not on file  Food Insecurity: Not on file  Transportation Needs: Not on file  Physical Activity: Not on file  Stress: Not on file  Social Connections: Not on file  Intimate Partner Violence: Not on file    REVIEW OF SYSTEMS: Constitutional: No new weakness or fatigue ENT:  No nose bleeds Pulm: No shortness of breath or cough CV:  No palpitations, no LE edema.  See HPI. GU:  No hematuria, no frequency GI: See HPI. Heme: No unusual bleeding or bruising Transfusions: None. Neuro:  No headaches, no peripheral tingling or numbness.  No syncope, no seizures.  No dizziness. Derm:  No itching, no rash or sores.  Endocrine:  No sweats or chills.  No polyuria or dysuria Immunization: Has been vaccinated for COVID-19. Travel:  None beyond local counties in last few months.    PHYSICAL EXAM: Vital signs in last 24 hours: Vitals:   09/02/21 0739 09/02/21 0832  BP: 114/63 131/70  Pulse: 65   Resp: 18   Temp: (!) 97.5 F (36.4 C)   SpO2: 96%    Wt Readings from Last 3 Encounters:  08/31/21 122.2 kg  08/20/21 127 kg  08/13/21 127.4 kg    General: Obese, does not look acutely ill.  Not uncomfortable. Head: No facial asymmetry or swelling Eyes: No scleral icterus or conjunctival pallor. Ears: Not hard of hearing Nose: No discharge or congestion Mouth: Good dentition.  Mucosa moist, pink, clear.  Tongue  Neck: No JVD Lungs: Clear to auscultation in front.  No labored breathing or cough Heart: RRR without MRG.  S1, S2 present Abdomen: Obese.  Not tender.  Bowel sounds normal but hypoactive.  No HSM,  masses, bruits, hernias appreciated.   Rectal: Deferred Musc/Skeltl: No joint redness, swelling or gross deformity. Extremities: Nonpitting lower extremity/pedal edema. Neurologic: Alert.  Oriented x3.  No tremors.  Did not test limb strength. Skin: No rash, no sores, no telangiectasia Psych: Calm, cooperative.  Intake/Output from previous day: 12/19 0701 - 12/20 0700 In: 240 [P.O.:237; I.V.:3] Out: 400 [Urine:400] Intake/Output this shift: No intake/output data recorded.  LAB RESULTS: Recent Labs    08/31/21 1325 09/01/21 0224  WBC 6.2 8.3  HGB 13.4 12.6*  HCT 41.2 39.5  PLT 217 221   BMET Lab Results  Component Value Date   NA 136 09/01/2021   NA 138 08/31/2021   NA 133 (L) 08/13/2021   K 4.6 09/01/2021   K 5.0 08/31/2021   K 4.0 08/13/2021   CL 102 09/01/2021   CL 103 08/31/2021   CL 106 08/13/2021   CO2 26 09/01/2021   CO2 26 08/31/2021   CO2 20 (L) 08/13/2021   GLUCOSE 169 (H) 09/01/2021   GLUCOSE 217 (H) 08/31/2021   GLUCOSE 193 (H) 08/13/2021   BUN 28 (H) 09/01/2021   BUN 35 (H) 08/31/2021   BUN 22 08/13/2021   CREATININE 1.58 (H) 09/01/2021   CREATININE 1.62 (H) 08/31/2021   CREATININE 1.25 (H) 08/13/2021   CALCIUM 9.4 09/01/2021   CALCIUM 9.6 08/31/2021   CALCIUM 8.8 (L) 08/13/2021   LFT Recent Labs    08/31/21 1325  PROT 7.3  ALBUMIN 3.6  AST 36  ALT 33  ALKPHOS 40  BILITOT 0.5   PT/INR Lab Results  Component Value Date   INR 1.3 (H) 08/31/2021   INR 1.2 08/09/2021   INR 1.4 (H) 12/13/2020   Hepatitis Panel No results for input(s): HEPBSAG, HCVAB, HEPAIGM, HEPBIGM in the last 72 hours. C-Diff No components found for: CDIFF Lipase     Component Value Date/Time   LIPASE 27 08/31/2021 1325    Drugs of Abuse  No results found for: LABOPIA, COCAINSCRNUR, LABBENZ, AMPHETMU, THCU, LABBARB   RADIOLOGY STUDIES: CT ABDOMEN PELVIS W CONTRAST  Result Date: 08/31/2021 CLINICAL DATA:  Left lower quadrant abdominal pain. EXAM: CT  ABDOMEN AND PELVIS WITH CONTRAST TECHNIQUE: Multidetector CT imaging of the abdomen and pelvis was performed using the standard protocol following bolus administration of intravenous contrast. CONTRAST:  156mL OMNIPAQUE IOHEXOL 300 MG/ML  SOLN COMPARISON:  CT abdomen and pelvis 08/09/2021. CT abdomen and pelvis 06/10/2019. FINDINGS: Lower chest: There is atelectasis in the lung bases. Small pericardial effusion is unchanged. Hepatobiliary: No focal liver abnormality is seen. Status post cholecystectomy. No biliary dilatation. Pancreas: There is an 11 mm hypodense area in the uncinate process of the pancreas which is stable from 08/09/2021, but new from 06/10/2019. The pancreas is otherwise within normal limits. Spleen: Normal in size without focal abnormality. Adrenals/Urinary Tract: There is an 8.2 cm cyst in the left kidney similar to the prior study. There is a stable 2 cm cyst in the inferior right kidney. There is no hydronephrosis. Adrenal glands and bladder are within normal limits. Stomach/Bowel: Stomach is within normal limits. Appendix appears normal. No evidence of bowel wall thickening, distention, or inflammatory changes. Vascular/Lymphatic: Aortic atherosclerosis. No enlarged abdominal or pelvic lymph nodes. Reproductive: Prostate is unremarkable. Other: There is a small fat  containing left inguinal hernia. There is no ascites. There is mild body wall edema. Musculoskeletal: No acute or significant osseous findings. IMPRESSION: 1. Grossly unchanged 8.2 cm left inferior pole renal cyst. 2. Stable small pericardial effusion. 3. Mild body wall edema. 4. Hypodense lesion in the uncinate process of the pancreas, unchanged from 08/09/2021 and new from 06/10/2019. Recommend follow-up pancreatic MRI. 5. Aortic Atherosclerosis (ICD10-I70.0). Electronically Signed   By: Ronney Asters M.D.   On: 08/31/2021 15:37   DG Chest Portable 1 View  Result Date: 08/31/2021 CLINICAL DATA:  Chest pain. EXAM: PORTABLE  CHEST 1 VIEW COMPARISON:  Chest radiograph 08/09/2021 FINDINGS: Interval removal of enteric tube. No focal consolidation, pleural effusion, or pneumothorax. Stable cardiomediastinal silhouette. Aortic calcifications. Slight rightward deviation of the trachea at the thoracic inlet, consistent with left thyroid goiter demonstrated on prior CT 02/29/2020. IMPRESSION: 1. Interval removal of enteric tube. 2. No acute cardiopulmonary abnormality. 3. Aortic Atherosclerosis (ICD10-I70.0). Electronically Signed   By: Ileana Roup M.D.   On: 08/31/2021 15:28      IMPRESSION:   Acute on chronic nonbloody, not coffee ground nausea and vomiting in patient with gastric emptying study confirming gastroparesis in the past.  Symptoms have worsened since initiating Vascepa last month.  Compliant with using daily Prevacid and bedtime Reglan.     Constipation  History ileus vs SBO.  Current CT scan not showing either.  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  LFTs, Lipase normal.    Multivessel CAD.  Although CABG would be preferred therapy, patient's comorbidities prohibit this.  At present plan is for outpatient PCI once renal function allows.  Just started on Ranexa for ongoing chest pain and significantly elevated troponins.  On chronic low-dose aspirin and Eliquis, Plavix discontinued a couple of weeks ago.    PLAN:       Per Dr  Jerilynn Mages.  EGD?  If so on or off Eliquis?     Azucena Freed  09/02/2021, 11:31 AM Phone [phone]

## 2021-09-02 NOTE — Evaluation (Signed)
Occupational Therapy Evaluation Patient Details Name: Randy Liu MRN: [id number] DOB: [birth year] Today's Date: 09/02/2021   History of Present Illness Patient is a 70 year old male with history of diastolic congestive heart failure, diabetes type 2, hyperlipidemia, GERD, DVT, PE on anticoagulation who presented with complaints of abdominal pain, chest pain, nausea, vomiting and elevated troponins. Patient also endorsed chronic history of gastroparesis, upper abdominal discomfort requesting GI consultation. CT negative for ileus or SBO.   Clinical Impression   PTA, pt was living at home with his wife. Pt was working with PT to progress with transfers to w/c. Pt reports it has been 2 months since he has transferred to the w/c, largely due to increased vertigo. Pt reports vertigo is worse with rolling toward the right. Pt is mostly bedbound and only sits EOB when working with PT (2x/week), otherwise pt is in his hospital bed and wife assists with bathing, dressing, toileting.   Pt noted to have R eyelid droop and what appeared to by slight droop of the right side of pt's face, he demonstrates right lateral lean preference, RLE weakness. Pt's wife reports this is baseline. 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 Pt has not had further imaging of the brain since this date and wife was unable to provide timeframe of when she noted RLE weakness, R eyelid droop and R facial droop. Notified RN.   Pt currently limited this session secondary to reports of increased nausea with changes to [facility] elevation. Educated pt and wife on importance of mobility in the bed and sitting in more upright position. Currently recommend HHOT at d/c and recommending a hoyer lift. Pt will continue to benefit from skilled OT services to maximize safety and independence with ADL/IADL and functional mobility. Will continue to follow acutely and progress as tolerated.       Recommendations for follow up  therapy are one component of a multi-disciplinary discharge planning process, led by the attending physician.  Recommendations may be updated based on patient status, additional functional criteria and insurance authorization.   Follow Up Recommendations  Home health OT    Assistance Recommended at Discharge    Functional Status Assessment  Patient has had a recent decline in their functional status and demonstrates the ability to make significant improvements in function in a reasonable and predictable amount of time.  Equipment Recommendations  Other (comment) (dependent lift)    Recommendations for Other Services       Precautions / Restrictions Precautions Precautions: Fall Precaution Comments: pt is nonambulatory at baseline Restrictions Weight Bearing Restrictions: No      Mobility Bed Mobility               General bed mobility comments: pt assisted with repositioning higher in bed, did not assess rolling this date secondary to increased nausea    Transfers                   General transfer comment: dependent, deferred this date      Balance                                           ADL either performed or assessed with clinical judgement   ADL Overall ADL's : Needs assistance/impaired Eating/Feeding: Set up;Bed level   Grooming: Set up;Bed level   Upper Body Bathing: Minimal assistance;Bed level   Lower  Body Bathing: Maximal assistance;Bed level   Upper Body Dressing : Moderate assistance;Bed level   Lower Body Dressing: Maximal assistance;Bed level     Toilet Transfer Details (indicate cue type and reason): pt would require bed pan Toileting- Clothing Manipulation and Hygiene: Maximal assistance         General ADL Comments: pt limited to bed level. Pt is bedbound at baseline, but has been transferring to sit EOB with PT. Pt limited this session secondary to nausea with sitting HOB in more upright position      Vision Ability to See in Adequate Light: 0 Adequate Patient Visual Report: No change from baseline Vision Assessment?: Yes Ocular Range of Motion: Restricted on the right Alignment/Gaze Preference: Head tilt (toward right) Tracking/Visual Pursuits: Unable to hold eye position out of midline Saccades: Additional eye shifts occurred during testing;Undershoots;Decreased speed of saccadic movement Visual Fields: No apparent deficits Additional Comments: noted R eyelid droop, pt reports this is baseline. Pt reports he has been dealing with vertigo that is worse with rolling toward the right. Pt has hx of detached retina, denies any acute changes     Perception     Praxis      Pertinent Vitals/Pain Pain Assessment: No/denies pain     Hand Dominance Right   Extremity/Trunk Assessment Upper Extremity Assessment Upper Extremity Assessment: Overall WFL for tasks assessed   Lower Extremity Assessment Lower Extremity Assessment: Defer to PT evaluation       Communication Communication Communication: HOH (bilateral hearing aides)   Cognition Arousal/Alertness: Awake/alert Behavior During Therapy: WFL for tasks assessed/performed Overall Cognitive Status: Within Functional Limits for tasks assessed                                 General Comments: not formally assessed     General Comments  pt's wife present during session. VSS HR 60s, spO2 >94% RA BP 126/74    Exercises     Shoulder Instructions      Home Living Family/patient expects to be discharged to:: Private residence Living Arrangements: Spouse/significant other Available Help at Discharge: Family;Available 24 hours/day Type of Home: House Home Access: Ramped entrance     Home Layout: One level     Bathroom Shower/Tub: Occupational psychologist: Handicapped height Bathroom Accessibility: Yes   Home Equipment: Hospital bed;Wheelchair - power;Wheelchair - manual;Hand held shower  head   Additional Comments: Uses public transportation for appointments      Prior Functioning/Environment Prior Level of Function : Needs assist       Physical Assist : Mobility (physical) Mobility (physical): Bed mobility;Transfers   Mobility Comments: 2 months since transfer to wheelchair, needs stretcher transfer to medical diagnostics, part of [facility] house call program ADLs Comments: toilets, bathes and eats in the bed        OT Problem List: Decreased activity tolerance;Impaired balance (sitting and/or standing);Obesity      OT Treatment/Interventions: Self-care/ADL training;Therapeutic exercise;DME and/or AE instruction;Patient/family education;Balance training    OT Goals(Current goals can be found in the care plan section) Acute Rehab OT Goals Patient Stated Goal: to get better and be able to transfer to/from w/c OT Goal Formulation: With patient Time For Goal Achievement: 09/16/21 Potential to Achieve Goals: Good ADL Goals Pt Will Perform Grooming: sitting;with modified independence Additional ADL Goal #1: Pt will complete bed mobility with modA in preparation for ADL. Additional ADL Goal #2: Pt will complete lateral scoot  transfers with minA in preparation for toilet transfer.  OT Frequency: Min 1X/week   Barriers to D/C:            Co-evaluation PT/OT/SLP Co-Evaluation/Treatment: Yes Reason for Co-Treatment: Complexity of the patient's impairments (multi-system involvement);For patient/therapist safety;To address functional/ADL transfers   OT goals addressed during session: ADL's and self-care      AM-PAC OT "6 Clicks" Daily Activity     Outcome Measure Help from another person eating meals?: A Little Help from another person taking care of personal grooming?: A Little Help from another person toileting, which includes using toliet, bedpan, or urinal?: A Lot Help from another person bathing (including washing, rinsing, drying)?: A Lot Help from another  person to put on and taking off regular upper body clothing?: A Lot Help from another person to put on and taking off regular lower body clothing?: A Lot 6 Click Score: 14   End of Session Nurse Communication: Mobility status  Activity Tolerance: Patient tolerated treatment well;Other (comment) (limited by nausea) Patient left: in bed;with call bell/phone within reach;with bed alarm set;with family/visitor present;Other (comment) (with bed in chair position)  OT Visit Diagnosis: Other abnormalities of gait and mobility (R26.89);Muscle weakness (generalized) (M62.81)                Time: 1051-1130 OT Time Calculation (min): 39 min Charges:  OT General Charges $OT Visit: 1 Visit OT Evaluation $OT Eval Moderate Complexity: 1 Mod OT Treatments $Self Care/Home Management : 8-22 mins  Helene Kelp OTR/L Acute Rehabilitation Services Office: [place] 09/02/2021, 1:00 PM

## 2021-09-02 NOTE — Evaluation (Signed)
Physical Therapy Evaluation Patient Details Name: Randy Liu MRN: [id number] DOB: [birth year] Today's Date: 09/02/2021  History of Present Illness  Patient is a 70 year old male with history of diastolic congestive heart failure, diabetes type 2, hyperlipidemia, GERD, DVT, PE on anticoagulation who presented with complaints of abdominal pain, chest pain, nausea, vomiting and elevated troponins. Patient also endorsed chronic history of gastroparesis, upper abdominal discomfort requesting GI consultation. CT negative for ileus or SBO.   Clinical Impression  PTA pt living with wife in single story home with ramped entrance. Pt and wife report that for the past 2-3 weeks pt has been experiencing increased dizziness and nausea with positional change. Prior to that pt was tolerating coming to seated at [facility] with PT working on sitting balance, but has been limited to strengthening exercises in bed. Pt bathes, toilets and eats in the bed. Pt has house calls by physicians group and requires PTAR transfer for diagnostics.   Pt presents with R facial droop and R sided weakness, and R lateral lean. Pt wife reports that pt received MRI in 2021 when he was found to be negative for CVA but cavernoma was identified. Wife unable to tell if facial droop is worse than prior.   Therapy recommending return home with return to HHPT with current agency. Also recommend hoyer lift to improve pt transfer to wheelchair and tolerance to upright for improved lung, and GI function. PT will continue to follow acutely.      Recommendations for follow up therapy are one component of a multi-disciplinary discharge planning process, led by the attending physician.  Recommendations may be updated based on patient status, additional functional criteria and insurance authorization.  Follow Up Recommendations Home health PT    Assistance Recommended at Discharge Frequent or constant Supervision/Assistance  Functional Status  Assessment Patient has had a recent decline in their functional status and demonstrates the ability to make significant improvements in function in a reasonable and predictable amount of time.  Equipment Recommendations  Other (comment) (hoyer lift)       Precautions / Restrictions Precautions Precautions: Fall Precaution Comments: pt is nonambulatory at baseline Restrictions Weight Bearing Restrictions: No      Mobility  Bed Mobility               General bed mobility comments: pt assisted with repositioning higher in bed, did not assess rolling this date secondary to increased nausea    Transfers                   General transfer comment: dependent, deferred this date           Pertinent Vitals/Pain Pain Assessment: No/denies pain    Home Living Family/patient expects to be discharged to:: Private residence Living Arrangements: Spouse/significant other Available Help at Discharge: Family;Available 24 hours/day Type of Home: House Home Access: Ramped entrance       Home Layout: One level Home Equipment: Hospital bed;Wheelchair - power;Wheelchair - manual;Hand held shower head Additional Comments: uses PT[facility] transport for appointments    Prior Function Prior Level of Function : Needs assist       Physical Assist : Mobility (physical) Mobility (physical): Bed mobility;Transfers   Mobility Comments: 2 months since transfer to wheelchair, needs stretcher transfer to medical diagnostics, part of [facility] house call program ADLs Comments: toilets, bathes and eats in the bed     Hand Dominance   Dominant Hand: Right    Extremity/Trunk Assessment   Upper Extremity Assessment Upper Extremity  Assessment: Defer to OT evaluation    Lower Extremity Assessment Lower Extremity Assessment: RLE deficits/detail;LLE deficits/detail RLE Deficits / Details: ROM WFL, strength grossly 2-3/5 RLE Sensation: history of peripheral neuropathy LLE Deficits / Details:  ROM WFL, strength grossly 4/5 LLE Sensation: history of peripheral neuropathy       Communication   Communication: HOH (bilateral hearing aides)  Cognition Arousal/Alertness: Awake/alert Behavior During Therapy: WFL for tasks assessed/performed Overall Cognitive Status: Within Functional Limits for tasks assessed                                 General Comments: not formally assessed        General Comments General comments (skin integrity, edema, etc.): pt's wife present during session. VSS HR 60s, spO2 >94% RA BP 126/74    Exercises General Exercises - Lower Extremity Ankle Circles/Pumps: AROM;10 reps;Seated Long Arc Quad: AAROM;Both;10 reps;Seated   Assessment/Plan    PT Assessment Patient needs continued PT services  PT Problem List Decreased strength;Decreased range of motion;Decreased activity tolerance;Decreased balance;Decreased mobility;Impaired sensation;Cardiopulmonary status limiting activity       PT Treatment Interventions DME instruction;Stair training;Functional mobility training;Therapeutic activities;Therapeutic exercise;Balance training;Cognitive remediation;Patient/family education    PT Goals (Current goals can be found in the Care Plan section)  Acute Rehab PT Goals Patient Stated Goal: improved QOL PT Goal Formulation: With patient Time For Goal Achievement: 09/16/21 Potential to Achieve Goals: Good    Frequency Min 3X/week    Co-evaluation PT/OT/SLP Co-Evaluation/Treatment: Yes Reason for Co-Treatment: Complexity of the patient's impairments (multi-system involvement) PT goals addressed during session: Mobility/safety with mobility OT goals addressed during session: ADL's and self-care       AM-PAC PT "6 Clicks" Mobility  Outcome Measure Help needed turning from your back to your side while in a flat bed without using bedrails?: Total Help needed moving from lying on your back to sitting on the side of a flat bed without  using bedrails?: Total Help needed moving to and from a bed to a chair (including a wheelchair)?: Total Help needed standing up from a chair using your arms (e.g., wheelchair or bedside chair)?: Total Help needed to walk in hospital room?: Total Help needed climbing 3-5 steps with a railing? : Total 6 Click Score: 6    End of Session   Activity Tolerance: Other (comment) (limited by dizziness and nausea) Patient left: in bed;Other (comment);with call bell/phone within reach;with bed alarm set;with family/visitor present (placed in chair position) Nurse Communication: Mobility status;Other (comment) (R sided weakness) PT Visit Diagnosis: Other abnormalities of gait and mobility (R26.89);Muscle weakness (generalized) (M62.81);Difficulty in walking, not elsewhere classified (R26.2);Other symptoms and signs involving the nervous system (R29.898)    Time: 1051-1130 PT Time Calculation (min) (ACUTE ONLY): 39 min   Charges:   PT Evaluation $PT Eval Moderate Complexity: 1 Mod          Teniqua Marron B. Migdalia Dk PT, DPT Acute Rehabilitation Services Pager [phone] Office [phone]   [place] 09/02/2021, 1:39 PM

## 2021-09-02 NOTE — Plan of Care (Signed)
°  Problem: Education: °Goal: Knowledge of General Education information will improve °Description: Including pain rating scale, medication(s)/side effects and non-pharmacologic comfort measures °Outcome: Progressing °  °Problem: Clinical Measurements: °Goal: Ability to maintain clinical measurements within normal limits will improve °Outcome: Progressing °  °Problem: Clinical Measurements: °Goal: Cardiovascular complication will be avoided °Outcome: Progressing °  °Problem: Activity: °Goal: Risk for activity intolerance will decrease °Outcome: Progressing °  °Problem: Pain Managment: °Goal: General experience of comfort will improve °Outcome: Progressing °  °Problem: Safety: °Goal: Ability to remain free from injury will improve °Outcome: Progressing °  °Problem: Skin Integrity: °Goal: Risk for impaired skin integrity will decrease °Outcome: Progressing °  °

## 2021-09-02 NOTE — Progress Notes (Signed)
PROGRESS NOTE    Pal Shell  YBO:[id number] DOB: [birth year] DOA: 08/31/2021 PCP: Felton Clinton, MD   Chief Complain: Nausea, vomiting, chest pain  Brief Narrative: Patient is a 70 year old male with history of diastolic congestive heart failure, diabetes type 2, hyperlipidemia, GERD, DVT, PE on anticoagulation who presented with complaints of abdominal pain, chest pain, nausea, vomiting.  He was recently admitted at [facility], found to have elevated troponin at the time and underwent left heart cath which showed multivessel coronary artery disease for which she was recommended medical management.  He multiple episodes of nausea, vomiting, chest pressure at home.  On presentation his troponins were gradually increasing so he was transferred to [facility] for further evaluation.  Cardiology were consulted and following.  Currently on heparin drip.  Patient also endorsed chronic history of gastroparesis, upper abdominal discomfort requesting GI consultation.  GI evaluating him today.  Assessment & Plan:   Principal Problem:   NSTEMI (non-ST elevated myocardial infarction) (HCC) Active Problems:   Type 2 diabetes mellitus with hyperlipidemia (HCC)   Nausea and vomiting   Coronary artery disease involving native coronary artery of native heart with unstable angina pectoris (HCC)   CKD (chronic kidney disease) stage 3, GFR 30-59 ml/min (HCC)   Generalized abdominal pain   Chest pressure/NSTEMI: Presented with chest pressure, abdominal symptoms.  Elevated troponin at presentation.  Plan for medical management. Denies any chest pain today.  Initially started on heparin stopped.  Continue as needed nitroglycerin for chest pain.  Cardiology started on Ranexa  Coronary artery disease: Last admission cardiac cath showed multivessel coronary disease, medical management has been recommended.  Cardiology following here.  On carvedilol, vascepa, losartan.  History of statin intolerance  Upper GI  symptoms: Has long history of gastroparesis, upper GI symptoms which could be contributing to chest pain.  Patient and wife requesting for GI consultation. [facility] consulted.  Chest pain improved with Maalox, continue Protonix.  Also on reglan  History of diastolic congestive heart failure: Currently euvolemic, mild elevated BNP.  Continue current medications  Diabetes type 2: Continue current insulin regimen.  Monitor blood sugars.On insulin at home  CKD stage IIIa: Continue function at baseline.  Follows with nephrology at [facility]  Hypertension: Takes carvedilol, losartan at home.  Currently blood pressure soft.  Dose of carvedilol decreased to 3.125 mg twice daily  Nausea/vomiting: Resolved  Debility/deconditioning: Chronically bedbound, nonambulatory due to severe neuropathy.  Requested for PT/OT evaluation  Morbid obesity: BMI 35.5  Recent history of infected sebaceous cyst excision lesion: Wound examined at the bedside.  Appears noninfected, clean.  Wound care has been following.  No antibiotics indicated for now.           DVT prophylaxis: Heparin IV Code Status: Full code Family Communication: Wife at the bedside Patient status: Inpatient  Dispo: The patient is from: Home              Anticipated d/c is to: Home               Anticipated d/c date is: Likely tomorrow.  Patient and his wife do not think that he is ready for discharge today because of upper GI symptoms and requesting for GI consultation  Consultants: Cardiology  Procedures: None  Antimicrobials:  Anti-infectives (From admission, onward)    None       Subjective:  Patient seen and examined the bedside this morning.  Hemodynamically stable.  Lying in bed.  Wife at the bedside.  He  had issues with upper GI symptoms last night and developed chest pain which improved with Maalox   Objective: Vitals:   09/01/21 2015 09/01/21 2324 09/02/21 0416 09/02/21 0739  BP: (!) 115/59 (!) 134/58 (!)  112/56 114/63  Pulse: 61 61 64 65  Resp: 18 18 18 18   Temp: 97.8 F (36.6 C) 98 F (36.7 C) 97.7 F (36.5 C) (!) 97.5 F (36.4 C)  TempSrc: Oral Oral Oral Oral  SpO2: 98% 98% 98% 96%  Weight:      Height:        Intake/Output Summary (Last 24 hours) at 09/02/2021 0755 Last data filed at 09/01/2021 2043 Gross per 24 hour  Intake 240 ml  Output 400 ml  Net -160 ml   Filed Weights   08/31/21 1327 08/31/21 2300  Weight: 123.8 kg 122.2 kg    Examination:   General exam: Morbidly obese, overall comfortable, not in distress, chronically HEENT: PERRL Respiratory system:  no wheezes or crackles  Cardiovascular system: S1 & S2 heard, RRR.  Gastrointestinal system: Abdomen is nondistended, soft and nontender. Central nervous system: Alert and oriented Extremities: No edema, no clubbing ,no cyanosis Skin: No rashes, no ulcers,no icterus.   clean sebaceous cyst excision wound on the back   Data Reviewed: I have personally reviewed following labs and imaging studies  CBC: Recent Labs  Lab 08/31/21 1325 09/01/21 0224  WBC 6.2 8.3  NEUTROABS 4.0  --   HGB 13.4 12.6*  HCT 41.2 39.5  MCV 95.4 95.2  PLT 217 [id number]   Basic Metabolic Panel: Recent Labs  Lab 08/31/21 1325 09/01/21 0224  NA 138 136  K 5.0 4.6  CL 103 102  CO2 26 26  GLUCOSE 217* 169*  BUN 35* 28*  CREATININE 1.62* 1.58*  CALCIUM 9.6 9.4   GFR: Estimated Creatinine Clearance: 57.9 mL/min (A) (by C-G formula based on SCr of 1.58 mg/dL (H)). Liver Function Tests: Recent Labs  Lab 08/31/21 1325  AST 36  ALT 33  ALKPHOS 40  BILITOT 0.5  PROT 7.3  ALBUMIN 3.6   Recent Labs  Lab 08/31/21 1325  LIPASE 27   No results for input(s): AMMONIA in the last 168 hours. Coagulation Profile: Recent Labs  Lab 08/31/21 2044  INR 1.3*   Cardiac Enzymes: No results for input(s): CKTOTAL, CKMB, CKMBINDEX, TROPONINI in the last 168 hours. BNP (last 3 results) No results for input(s): PROBNP in the last  8760 hours. HbA1C: No results for input(s): HGBA1C in the last 72 hours. CBG: Recent Labs  Lab 09/01/21 0746 09/01/21 1131 09/01/21 1623 09/01/21 2128 09/02/21 0738  GLUCAP 170* 165* 186* 110* 156*   Lipid Profile: No results for input(s): CHOL, HDL, LDLCALC, TRIG, CHOLHDL, LDLDIRECT in the last 72 hours. Thyroid Function Tests: No results for input(s): TSH, T4TOTAL, FREET4, T3FREE, THYROIDAB in the last 72 hours. Anemia Panel: No results for input(s): VITAMINB12, FOLATE, FERRITIN, TIBC, IRON, RETICCTPCT in the last 72 hours. Sepsis Labs: No results for input(s): PROCALCITON, LATICACIDVEN in the last 168 hours.  Recent Results (from the past 240 hour(s))  Resp Panel by RT-PCR (Flu A&B, Covid) Nasopharyngeal Swab     Status: None   Collection Time: 08/31/21  1:46 PM   Specimen: Nasopharyngeal Swab; Nasopharyngeal(NP) swabs in vial transport medium  Result Value Ref Range Status   SARS Coronavirus 2 by RT PCR NEGATIVE NEGATIVE Final    Comment: (NOTE) SARS-CoV-2 target nucleic acids are NOT DETECTED.  The SARS-CoV-2 RNA is generally detectable in  upper respiratory specimens during the acute phase of infection. The lowest concentration of SARS-CoV-2 viral copies this assay can detect is 138 copies/mL. A negative result does not preclude SARS-Cov-2 infection and should not be used as the sole basis for treatment or other patient management decisions. A negative result may occur with  improper specimen collection/handling, submission of specimen other than nasopharyngeal swab, presence of viral mutation(s) within the areas targeted by this assay, and inadequate number of viral copies(<138 copies/mL). A negative result must be combined with clinical observations, patient history, and epidemiological information. The expected result is Negative.  Fact Sheet for Patients:  EntrepreneurPulse.com.au  Fact Sheet for Healthcare Providers:   IncredibleEmployment.be  This test is no t yet approved or cleared by the [place] FDA and  has been authorized for detection and/or diagnosis of SARS-CoV-2 by FDA under an Emergency Use Authorization (EUA). This EUA will remain  in effect (meaning this test can be used) for the duration of the COVID-19 declaration under Section 564(b)(1) of the Act, 21 U.S.C.section 360bbb-3(b)(1), unless the authorization is terminated  or revoked sooner.       Influenza A by PCR NEGATIVE NEGATIVE Final   Influenza B by PCR NEGATIVE NEGATIVE Final    Comment: (NOTE) The Xpert Xpress SARS-CoV-2/FLU/RSV plus assay is intended as an aid in the diagnosis of influenza from Nasopharyngeal swab specimens and should not be used as a sole basis for treatment. Nasal washings and aspirates are unacceptable for Xpert Xpress SARS-CoV-2/FLU/RSV testing.  Fact Sheet for Patients: EntrepreneurPulse.com.au  Fact Sheet for Healthcare Providers: IncredibleEmployment.be  This test is not yet approved or cleared by the [place] FDA and has been authorized for detection and/or diagnosis of SARS-CoV-2 by FDA under an Emergency Use Authorization (EUA). This EUA will remain in effect (meaning this test can be used) for the duration of the COVID-19 declaration under Section 564(b)(1) of the Act, 21 U.S.C. section 360bbb-3(b)(1), unless the authorization is terminated or revoked.  Performed at [facility], [address], [place],  [address]          Radiology Studies: CT ABDOMEN PELVIS W CONTRAST  Result Date: 08/31/2021 CLINICAL DATA:  Left lower quadrant abdominal pain. EXAM: CT ABDOMEN AND PELVIS WITH CONTRAST TECHNIQUE: Multidetector CT imaging of the abdomen and pelvis was performed using the standard protocol following bolus administration of intravenous contrast. CONTRAST:  124mL OMNIPAQUE IOHEXOL 300 MG/ML  SOLN  COMPARISON:  CT abdomen and pelvis 08/09/2021. CT abdomen and pelvis 06/10/2019. FINDINGS: Lower chest: There is atelectasis in the lung bases. Small pericardial effusion is unchanged. Hepatobiliary: No focal liver abnormality is seen. Status post cholecystectomy. No biliary dilatation. Pancreas: There is an 11 mm hypodense area in the uncinate process of the pancreas which is stable from 08/09/2021, but new from 06/10/2019. The pancreas is otherwise within normal limits. Spleen: Normal in size without focal abnormality. Adrenals/Urinary Tract: There is an 8.2 cm cyst in the left kidney similar to the prior study. There is a stable 2 cm cyst in the inferior right kidney. There is no hydronephrosis. Adrenal glands and bladder are within normal limits. Stomach/Bowel: Stomach is within normal limits. Appendix appears normal. No evidence of bowel wall thickening, distention, or inflammatory changes. Vascular/Lymphatic: Aortic atherosclerosis. No enlarged abdominal or pelvic lymph nodes. Reproductive: Prostate is unremarkable. Other: There is a small fat containing left inguinal hernia. There is no ascites. There is mild body wall edema. Musculoskeletal: No acute or significant osseous findings. IMPRESSION:  1. Grossly unchanged 8.2 cm left inferior pole renal cyst. 2. Stable small pericardial effusion. 3. Mild body wall edema. 4. Hypodense lesion in the uncinate process of the pancreas, unchanged from 08/09/2021 and new from 06/10/2019. Recommend follow-up pancreatic MRI. 5. Aortic Atherosclerosis (ICD10-I70.0). Electronically Signed   By: Ronney Asters M.D.   On: 08/31/2021 15:37   DG Chest Portable 1 View  Result Date: 08/31/2021 CLINICAL DATA:  Chest pain. EXAM: PORTABLE CHEST 1 VIEW COMPARISON:  Chest radiograph 08/09/2021 FINDINGS: Interval removal of enteric tube. No focal consolidation, pleural effusion, or pneumothorax. Stable cardiomediastinal silhouette. Aortic calcifications. Slight rightward deviation of  the trachea at the thoracic inlet, consistent with left thyroid goiter demonstrated on prior CT 02/29/2020. IMPRESSION: 1. Interval removal of enteric tube. 2. No acute cardiopulmonary abnormality. 3. Aortic Atherosclerosis (ICD10-I70.0). Electronically Signed   By: Ileana Roup M.D.   On: 08/31/2021 15:28        Scheduled Meds:  apixaban  5 mg Oral BID   aspirin EC  81 mg Oral Daily   carvedilol  3.125 mg Oral BID WC   icosapent Ethyl  1 g Oral BID   insulin aspart  0-15 Units Subcutaneous TID WC & HS   insulin aspart protamine- aspart  40 Units Subcutaneous BID WC   latanoprost  1 drop Left Eye QHS   sodium chloride flush  3 mL Intravenous Q12H   Continuous Infusions:  sodium chloride       LOS: 1 day    Time spent:35 mins. More than 50% of that time was spent in counseling and/or coordination of care.      Shelly Coss, MD [facility] P12/20/2022, 7:55 AM

## 2021-09-02 NOTE — Plan of Care (Signed)
°  Problem: Education: Goal: Knowledge of General Education information will improve Description: Including pain rating scale, medication(s)/side effects and non-pharmacologic comfort measures 09/02/2021 1848 by Mamie Nick I, RN Outcome: Progressing 09/02/2021 1847 by Mamie Nick I, RN Outcome: Progressing   Problem: Health Behavior/Discharge Planning: Goal: Ability to manage health-related needs will improve 09/02/2021 1848 by Mamie Nick I, RN Outcome: Progressing 09/02/2021 1847 by Mamie Nick I, RN Outcome: Progressing   Problem: Clinical Measurements: Goal: Ability to maintain clinical measurements within normal limits will improve 09/02/2021 1848 by Mamie Nick I, RN Outcome: Progressing 09/02/2021 1847 by Mamie Nick I, RN Outcome: Progressing Goal: Will remain free from infection 09/02/2021 1848 by Mamie Nick I, RN Outcome: Progressing 09/02/2021 1847 by Mamie Nick I, RN Outcome: Progressing Goal: Diagnostic test results will improve 09/02/2021 1848 by Mamie Nick I, RN Outcome: Progressing 09/02/2021 1847 by Mamie Nick I, RN Outcome: Progressing Goal: Respiratory complications will improve 09/02/2021 1848 by Mamie Nick I, RN Outcome: Progressing 09/02/2021 1847 by Mamie Nick I, RN Outcome: Progressing Goal: Cardiovascular complication will be avoided 09/02/2021 1848 by Mamie Nick I, RN Outcome: Progressing 09/02/2021 1847 by Mamie Nick I, RN Outcome: Progressing   Problem: Activity: Goal: Risk for activity intolerance will decrease 09/02/2021 1848 by Mamie Nick I, RN Outcome: Progressing 09/02/2021 1847 by Mamie Nick I, RN Outcome: Progressing   Problem: Nutrition: Goal: Adequate nutrition will be maintained 09/02/2021 1848 by Mamie Nick I, RN Outcome: Progressing 09/02/2021 1847 by Mamie Nick I, RN Outcome: Progressing   Problem:  Coping: Goal: Level of anxiety will decrease 09/02/2021 1848 by Mamie Nick I, RN Outcome: Progressing 09/02/2021 1847 by Mamie Nick I, RN Outcome: Progressing   Problem: Elimination: Goal: Will not experience complications related to bowel motility 09/02/2021 1848 by Mamie Nick I, RN Outcome: Progressing 09/02/2021 1847 by Mamie Nick I, RN Outcome: Progressing Goal: Will not experience complications related to urinary retention 09/02/2021 1848 by Mamie Nick I, RN Outcome: Progressing 09/02/2021 1847 by Mamie Nick I, RN Outcome: Progressing   Problem: Pain Managment: Goal: General experience of comfort will improve 09/02/2021 1848 by Mamie Nick I, RN Outcome: Progressing 09/02/2021 1847 by Mamie Nick I, RN Outcome: Progressing   Problem: Safety: Goal: Ability to remain free from injury will improve 09/02/2021 1848 by Mamie Nick I, RN Outcome: Progressing 09/02/2021 1847 by Mamie Nick I, RN Outcome: Progressing   Problem: Skin Integrity: Goal: Risk for impaired skin integrity will decrease 09/02/2021 1848 by Mamie Nick I, RN Outcome: Progressing 09/02/2021 1847 by Mamie Nick I, RN Outcome: Progressing

## 2021-09-02 NOTE — TOC Initial Note (Signed)
Transition of Care [facility]) - Initial/Assessment Note    Patient Details  Name: Garcia Carrisalez MRN: [id number] Date of Birth: December 12, 1950  Transition of [facility]) CM/SW Contact:    Bethena Roys, RN Phone Number: 09/02/2021, 3:37 PM  Clinical Narrative:  Risk for readmission assessment completed. Prior to hospitalization patient was from home with spouse. Patient is currently active with [facility] for RN/PT services. Patient will need resumption orders for [facility] RN, PT, OT and F2F once stable to transition home. Case Manager will continue to follow for additional transition of care needs.              Expected Discharge Plan: [place] Barriers to Discharge: No Barriers Identified   Patient Goals and CMS Choice Patient states their goals for this hospitalization and ongoing recovery are:: to return home      Expected Discharge Plan and Services Expected Discharge Plan: [place] In-house Referral: NA Discharge Planning Services: CM Consult Post Acute Care Choice: [place], Resumption of Svcs/PTA Provider                   DME Arranged: N/A DME Agency: NA       HH Arranged: RN, Disease Management, PT, OT HH Agency: [facility] Date HH Agency Contacted: 09/02/21 Time HH Agency Contacted: 1536 Representative spoke with at [place]: Levada Dy  Prior Living Arrangements/Services   Lives with:: Self, Spouse Patient language and need for interpreter reviewed:: Yes Do you feel safe going back to the place where you live?: Yes      Need for Family Participation in Patient Care: Yes (Comment) Care giver support system in place?: Yes (comment) Current home services: Home OT, Home RN (Active with [facility]) Criminal Activity/Legal Involvement Pertinent to Current Situation/Hospitalization: No - Comment as needed  Activities of Daily Living Home Assistive Devices/Equipment: Wheelchair, Hearing  aid ADL Screening (condition at time of admission) Patient's cognitive ability adequate to safely complete daily activities?: Yes Is the patient deaf or have difficulty hearing?: Yes Does the patient have difficulty seeing, even when wearing glasses/contacts?: No Does the patient have difficulty concentrating, remembering, or making decisions?: No Patient able to express need for assistance with ADLs?: Yes Does the patient have difficulty dressing or bathing?: Yes Independently performs ADLs?: No Communication: Independent Dressing (OT): Needs assistance Is this a change from baseline?: Pre-admission baseline Grooming: Needs assistance Is this a change from baseline?: Pre-admission baseline Feeding: Independent Bathing: Needs assistance Is this a change from baseline?: Pre-admission baseline Toileting: Needs assistance Is this a change from baseline?: Pre-admission baseline In/Out Bed: Needs assistance Is this a change from baseline?: Pre-admission baseline Walks in Home: Needs assistance Is this a change from baseline?: Pre-admission baseline Does the patient have difficulty walking or climbing stairs?: Yes Weakness of Legs: Both Weakness of Arms/Hands: Both  Permission Sought/Granted Permission sought to share information with : Family Supports, Customer service manager, Case Optician, dispensing granted to share information with : Yes, Verbal Permission Granted     Permission granted to share info w AGENCY: [facility]        Emotional Assessment Appearance:: Appears stated age       Alcohol / Substance Use: Not Applicable Psych Involvement: No (comment)  Admission diagnosis:  Generalized abdominal pain [R10.84] Elevated troponin [R77.8] NSTEMI (non-ST elevated myocardial infarction) ([place]) [I21.4] Chest pain, unspecified type [R07.9] Nausea and vomiting, unspecified vomiting type [R11.2] Patient Active Problem List  Diagnosis Date Noted    Intractable nausea and vomiting    Atherosclerotic heart disease    Long-term insulin use (HCC)    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)    Class 2 severe obesity due to excess calories with serious comorbidity and body mass index (BMI) of 35.0 to 35.9 in adult [facility])    Bedbound    Generalized abdominal pain    CKD (chronic kidney disease) stage 3, GFR 30-59 ml/min (HCC) 08/31/2021   Atherosclerosis of native coronary artery of native heart without angina pectoris    Coronary artery disease involving native coronary artery of native heart with unstable angina pectoris (HCC)    AKI (acute kidney injury) (HCC)    Pure hypercholesterolemia    NSTEMI (non-ST elevated myocardial infarction) (HCC) 08/10/2021   Elevated troponin    Ileus (HCC) 08/09/2021   Non-ST elevation (NSTEMI) myocardial infarction [facility]    Type 2 diabetes mellitus with hyperlipidemia (HCC)    Hypertriglyceridemia    Intractable abdominal pain    Nausea and vomiting    History of pulmonary embolism    Statin intolerance    Hematochezia 05/07/2021   Small bowel obstruction (HCC) 05/01/2021   SBO (small bowel obstruction) (HCC) 05/01/2021   Yeast infection 12/15/2020   Elevated LFTs 12/15/2020   Acute hypoxemic respiratory failure (HCC) 12/13/2020   Hyponatremia 12/13/2020   Asthma 12/13/2020   Diabetes (HCC) 12/13/2020   Acute cholangitis 12/12/2020   PCP:  Zoila Shutter, MD Pharmacy:   [facility] [place], [place] - [address] [place] [address] Phone: [phone] Fax: [phone]    Readmission Risk Interventions Readmission Risk Prevention Plan 09/02/2021 08/13/2021  Transportation Screening Complete Complete  PCP or Specialist Appt within 3-5 Days - Complete  HRI or Home Care Consult - Complete  Social Work Consult for Recovery Care Planning/Counseling - Complete  Palliative Care Screening - Not Applicable  Medication  Review Oceanographer) Complete Complete  HRI or Home Care Consult Complete -  SW Recovery Care/Counseling Consult Complete -  Palliative Care Screening Not Applicable -  Skilled Nursing Facility Not Applicable -  Some recent data might be hidden

## 2021-09-02 NOTE — Progress Notes (Signed)
EKG unchanged from priors (still suggestive of ischemia in lateral leads but no STEMI).  CP improved to 3/10 after NTG, though SBP down to 105 post NTG so dont want to try any more NTG at this time.  RN trying Maalox since pt thinks it might "just be indigestion" (I have my doubts given known multivessel CAD, trop elevations, etc).  Will get serial trops to continue to trend.

## 2021-09-02 NOTE — Plan of Care (Signed)

## 2021-09-02 NOTE — Progress Notes (Addendum)
Subjective:  Patient lying in bed resting comfortably with his wife, Randy Liu, present at bedside Patient reports last night at approximately 11:30 PM he developed left-sided shoulder pain lasting approximately an hour.  Patient's pain improved with sublingual nitroglycerin, and subsequently resolved with administration of Maalox.  Repeat EKG unchanged compared to previous Troponin thus far peaked at 934.  Have trended troponin 934 --> 493 --> 580  Intake/Output from previous day:  I/O last 3 completed shifts: In: 325.3 [P.O.:237; I.V.:88.3] Out: 400 [Urine:400] No intake/output data recorded.  Blood pressure 114/63, pulse 65, temperature (!) 97.5 F (36.4 C), temperature source Oral, resp. rate 18, height 6' 1"  (1.854 m), weight 122.2 kg, SpO2 96 %. Physical Exam Vitals reviewed.  Constitutional:      General: He is not in acute distress.    Appearance: He is obese.  HENT:     Head: Normocephalic and atraumatic.  Neck:     Vascular: No carotid bruit.     Comments: Difficult to evaluate JVD due to short neck and adipose tissue Cardiovascular:     Rate and Rhythm: Normal rate and regular rhythm.     Pulses: Intact distal pulses.     Heart sounds: S1 normal and S2 normal. No murmur heard.   No gallop.  Pulmonary:     Effort: Pulmonary effort is normal. No respiratory distress.     Breath sounds: No wheezing, rhonchi or rales.  Abdominal:     General: Bowel sounds are normal.     Tenderness: There is no abdominal tenderness.  Musculoskeletal:     Right lower leg: Edema (trace) present.     Left lower leg: Edema (trace) present.  Skin:    General: Skin is warm and dry.  Neurological:     Mental Status: He is alert and oriented to person, place, and time.   Lab Results: BMP BNP (last 3 results) Recent Labs    08/09/21 1435 09/01/21 0224  BNP 138.4* 146.8*    ProBNP (last 3 results) No results for input(s): PROBNP in the last 8760 hours. BMP Latest Ref Rng & Units  09/01/2021 08/31/2021 08/13/2021  Glucose 70 - 99 mg/dL 169(H) 217(H) 193(H)  BUN 8 - 23 mg/dL 28(H) 35(H) 22  Creatinine 0.61 - 1.24 mg/dL 1.58(H) 1.62(H) 1.25(H)  Sodium 135 - 145 mmol/L 136 138 133(L)  Potassium 3.5 - 5.1 mmol/L 4.6 5.0 4.0  Chloride 98 - 111 mmol/L 102 103 106  CO2 22 - 32 mmol/L 26 26 20(L)  Calcium 8.9 - 10.3 mg/dL 9.4 9.6 8.8(L)   Hepatic Function Latest Ref Rng & Units 08/31/2021 08/09/2021 05/02/2021  Total Protein 6.5 - 8.1 g/dL 7.3 7.1 6.8  Albumin 3.5 - 5.0 g/dL 3.6 3.3(L) 3.5  AST 15 - 41 U/L 36 23 26  ALT 0 - 44 U/L 33 21 29  Alk Phosphatase 38 - 126 U/L 40 42 36(L)  Total Bilirubin 0.3 - 1.2 mg/dL 0.5 0.4 0.8  Bilirubin, Direct 0.0 - 0.2 mg/dL - - -   CBC Latest Ref Rng & Units 09/01/2021 08/31/2021 08/12/2021  WBC 4.0 - 10.5 K/uL 8.3 6.2 6.6  Hemoglobin 13.0 - 17.0 g/dL 12.6(L) 13.4 10.9(L)  Hematocrit 39.0 - 52.0 % 39.5 41.2 33.5(L)  Platelets 150 - 400 K/uL 221 217 210   Lipid Panel     Component Value Date/Time   CHOL 223 (H) 08/10/2021 0204   TRIG 313 (H) 08/10/2021 0204   HDL 24 (L) 08/10/2021 0204   CHOLHDL 9.3 08/10/2021  0204   VLDL 63 (H) 08/10/2021 0204   LDLCALC 136 (H) 08/10/2021 1245   Cardiac Panel (last 3 results) No results for input(s): CKTOTAL, CKMB, TROPONINI, RELINDX in the last 72 hours.  HEMOGLOBIN A1C Lab Results  Component Value Date   HGBA1C 8.0 (H) 08/09/2021   MPG 183 08/09/2021   TSH No results for input(s): TSH in the last 8760 hours. Imaging: CT ABDOMEN PELVIS W CONTRAST 08/31/2021 1. Grossly unchanged 8.2 cm left inferior pole renal cyst.  2. Stable small pericardial effusion.  3. Mild body wall edema.  4. Hypodense lesion in the uncinate process of the pancreas, unchanged from 08/09/2021 and new from 06/10/2019. Recommend follow-up pancreatic MRI.  5. Aortic Atherosclerosis (ICD10-I70.0).   DG Chest Portable 08/31/2021 Interval removal of enteric tube. No focal consolidation, pleural effusion, or  pneumothorax. Stable cardiomediastinal silhouette. Aortic calcifications. Slight rightward deviation of the trachea at the thoracic inlet, consistent with left thyroid goiter demonstrated on prior CT 02/29/2020.  IMPRESSION:  1. Interval removal of enteric tube.  2. No acute cardiopulmonary abnormality.  3. Aortic Atherosclerosis (ICD10-I70.0).  Cardiac Studies: Echocardiogram 08/10/2021:  1. Left ventricular ejection fraction, by estimation, is 55 to 60%. The  left ventricle has normal function. Left ventricular endocardial border  not optimally defined to evaluate regional wall motion. Left ventricular  diastolic parameters are  indeterminate 2D images were obtain on 08/09/2021 LVEF 45-50% w/ RWMA  along the anterolateral and inferolateral walls. Definity images were  obtained 08/10/2021 which notes improvement in LVEF 55-60% without  regional wall motion abnormalities.   2. Right ventricular systolic function is normal. The right ventricular  size is normal. Mildly increased right ventricular wall thickness.   3. A small pericardial effusion is present. There is no evidence of  cardiac tamponade.   4. The mitral valve is normal in structure. Trivial mitral valve  regurgitation. No evidence of mitral stenosis.   5. The aortic valve is tricuspid. Aortic valve regurgitation is not  visualized. No aortic stenosis is present.   6. The inferior vena cava is normal in size with <50% respiratory  variability, suggesting right atrial pressure of 8 mmHg.   Left Heart Catheterization 08/12/21:  LV: 113/6, EDP 18 mmHg.  Ao 112/50, mean 75 mmHg.  No pressure gradient across the aortic valve. LM: Large vessel, 10 to 20% stenosis. LAD: Large vessel, it is occluded after giving origin to a small D1.  Mid to distal LAD is supplied by contralateral collaterals from the RCA.  D1 is diffusely diseased and is occluded in the mid to distal segment.  Mid to distal LAD appears to be widely patent with mild  disease and a good target for bypass. RI: Small vessel, no significant disease. CX: Large vessel.  Gives origin to large OM1 which is a high-grade 95% proximal stenosis followed by 20 to 30% diffuse mid segment stenosis.  Distal vessel is a good target for bypass.  There is a large OM 2 that is ulcerated and occluded in the proximal and mid segment and appears to be diffusely diseased in the proximal and mid segment.  It is a large vessel and distal vessel appears to be a good target for bypass. RCA: Dominant, large vessel.  Gives origin to 2 PDA branches, proximal PDA branch is smooth and normal.  After this distal RCA has a high-grade 90 to 95% bifurcation stenosis involving the PL branch.  Distal RCA gives excellent collaterals to the LAD.   Impression: Mild increase in  LVEDP.  LV gram not performed to conserve contrast.  Multivessel disease, ideally speaking would benefit from CABG.  However patient is essentially bedbound and wheelchair-bound due to obesity, peripheral neuropathy and generalized deconditioning.  Discussed with wife, patient does not appear to be a good candidate for CABG as his recovery would be extremely complicated.  Best option is to proceed with multivessel PCI electively, in spite of two-vessel disease, EF appears to be preserved.  I would like him to recuperate from his acute renal failure.  I have discussed all the options with patient's wife who is in agreement and favors conservative therapy/PCI over surgical therapy.  50 mL contrast utilized. Patient presently on Eliquis for PE, I have started him on aspirin along with Plavix for now.   EKG: 08/31/2021: Sinus rhythm at a rate of 65 bpm.  Left axis.  Poor R wave progression, cannot exclude anteroseptal infarct old.  Subtle ST depression in lateral leads, unchanged compared to previous EKG 08/10/2021.   08/10/2021: NSR, 73bpm, subtle ST depression high lateral and lateral leads, PRWP, without injury pattern.   Scheduled  Meds:  apixaban  5 mg Oral BID   aspirin EC  81 mg Oral Daily   carvedilol  3.125 mg Oral BID WC   icosapent Ethyl  1 g Oral BID   insulin aspart  0-15 Units Subcutaneous TID WC & HS   insulin aspart protamine- aspart  40 Units Subcutaneous BID WC   latanoprost  1 drop Left Eye QHS   sodium chloride flush  3 mL Intravenous Q12H   Continuous Infusions:  sodium chloride     PRN Meds:.sodium chloride, acetaminophen **OR** acetaminophen, alum & mag hydroxide-simeth, nitroGLYCERIN, ondansetron **OR** ondansetron (ZOFRAN) IV, polyvinyl alcohol, senna-docusate, sodium chloride flush  Assessment/Plan:  Have discussed at length with patient and his wife Randy Liu recommendations for medical management given that patient is not an ideal surgical candidate nor candidate for a percutaneous interventions per interventional cardiology.  They are in agreement with the plan.  Given patient's multivessel CAD and continued episodes of chest pain we will start Ranexa 500 mg p.o. twice daily.  Antianginal options are limited as patient's blood pressure remains soft.  We will continue Coreg at this time, however could consider switching patient to metoprolol outpatient which may allow for addition of Imdur in the future.   Will continue medical management on an outpatient basis. Will continued up titration of GDMT and antianginal therapy as hemodynamics and laboratory values allow. Will continue Repatha and Vascepa as outpatient as well.   Patient was seen in collaboration with Dr. Terri Skains. He also reviewed patient's chart and examined the patient. Dr. Terri Skains is in agreement of the plan.    Alethia Berthold, PA-C 09/02/2021, 8:08 AM Office: [phone]  ADDENDUM: Patient seen and examined independently. Note reviewed with the Lawerance Cruel, PA and relevant changes were made to her note if needed.  I personally reviewed laboratory data, imaging studies and relevant notes.  I independently examined the  patient and formulated the important aspects of the plan.  I have personally discussed the plan with the patient and wife Randy Liu.  Comments or changes to the note/plan are indicated below.  My Exam:  Vitals with BMI 09/02/2021 09/02/2021 09/02/2021  Height - - -  Weight - - -  BMI - - -  Systolic 462 703 500  Diastolic 66 70 63  Pulse 65 - 65    CONSTITUTIONAL: Appears older than stated age, hemodynamically stable, no acute  distress.   SKIN: Skin is warm and dry. No rash noted. No cyanosis. No pallor. No jaundice HEAD: Normocephalic and atraumatic.  EYES: No scleral icterus MOUTH/THROAT: Dry oral membranes.  NECK: Difficult to evaluate JVP due to short neck stature and adipose tissue.  No thyromegaly noted. No carotid bruits  LYMPHATIC: No visible cervical adenopathy.  CHEST Normal respiratory effort. No intercostal retractions  LUNGS: Clear to auscultation bilaterally.  No stridor. No wheezes. No rales.  CARDIOVASCULAR: Regular rate and rhythm, positive S1-S2, no murmurs rubs or gallops appreciated. ABDOMINAL: Obese, soft, nontender, nondistended, positive bowel sounds in all 4 quadrants, no apparent ascites.  EXTREMITIES: Trace bilateral edema, warm to touch, no peripheral edema  HEMATOLOGIC: No significant bruising NEUROLOGIC: Oriented to person, place, and time. Nonfocal. Normal muscle tone.  PSYCHIATRIC: Normal mood and affect. Normal behavior. 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  Presented to the hospital predominantly with GI symptoms associated with chest pain.  Troponins were checked which noted to be elevated most likely secondary  to supply demand ischemia given his underlying multivessel CAD.  It has been discussed in the past that given his poor functional capacity and predominately being bedbound he would not be an ideal candidate for surgical revascularization.  Percutaneous interventions may also be challenging and after discussing the risks, benefits, and alternatives patient and family would like to continue with medical therapy.  Recommend completing 48 hours of IV heparin in the setting of recent NSTEMI.  Patient will be discharged home on Eliquis and aspirin.  Triple therapy not indicated.  Yesterday night he had an episode of shoulder pain and chest discomfort.  Troponins elevated but downtrending.  EKG no significant change compared to prior findings.  Symptoms resolved after 1 sublingual nitroglycerin tablet and Maalox.  Blood pressures are soft unable to uptitrate antianginal therapy.  Continue carvedilol.  We will add Ranexa 500 mg p.o. twice daily.  Monitor for now.  Continue with aspirin, Vascepa, PCSK9 inhibitors.  Patient's last 2 episodes have been secondary to GI symptoms.  He has underlying gastroparesis and is currently on Reglan as outpatient.  It would be beneficial to be reevaluated by GI to see if additional medications can be provided to help his underlying gastroparesis such that he has less episodes of nausea and vomiting.  Will defer additional management to primary team whether it be inpatient or outpatient.  We will follow the patient with you.  Time Spent with Patient: I have spent a total of 35 minutes with patient reviewing hospital notes, telemetry, EKGs, labs and examining the patient as well as establishing an assessment and plan that was discussed with the patient.  > 50% of time was spent in direct patient care.  This note was created using a voice recognition software as a result there may be grammatical errors inadvertently enclosed that do not reflect the nature of this encounter.  Every attempt is made to correct such errors.  Signed, Mechele Claude [facility]  Pager: [phone] Office: [phone] 09/02/2021 12:37 PM

## 2021-09-02 NOTE — Progress Notes (Signed)
Pt has home CPAP unit, no assistance needed.  

## 2021-09-03 DIAGNOSIS — Q399 Congenital malformation of esophagus, unspecified: Secondary | ICD-10-CM

## 2021-09-03 DIAGNOSIS — K30 Functional dyspepsia: Secondary | ICD-10-CM | POA: Diagnosis not present

## 2021-09-03 DIAGNOSIS — I5032 Chronic diastolic (congestive) heart failure: Secondary | ICD-10-CM

## 2021-09-03 DIAGNOSIS — K3184 Gastroparesis: Secondary | ICD-10-CM | POA: Diagnosis not present

## 2021-09-03 DIAGNOSIS — K259 Gastric ulcer, unspecified as acute or chronic, without hemorrhage or perforation: Secondary | ICD-10-CM | POA: Diagnosis not present

## 2021-09-03 LAB — GLUCOSE, CAPILLARY
Glucose-Capillary: 182 mg/dL — ABNORMAL HIGH (ref 70–99)
Glucose-Capillary: 181 mg/dL — ABNORMAL HIGH (ref 70–99)
Glucose-Capillary: 184 mg/dL — ABNORMAL HIGH (ref 70–99)
Glucose-Capillary: 121 mg/dL — ABNORMAL HIGH (ref 70–99)
Glucose-Capillary: 152 mg/dL — ABNORMAL HIGH (ref 70–99)

## 2021-09-03 MED ORDER — POLYETHYLENE GLYCOL 3350 17 G PO PACK
17.0000 g | PACK | Freq: Every day | ORAL | Status: DC
  Administered 2021-09-03 – 2021-09-07 (×3): 17 g via ORAL
  Filled 2021-09-03 (×5): qty 1

## 2021-09-03 MED ORDER — SENNA 8.6 MG PO TABS
1.0000 | ORAL_TABLET | Freq: Two times a day (BID) | ORAL | Status: DC
  Administered 2021-09-03 – 2021-09-07 (×7): 8.6 mg via ORAL
  Filled 2021-09-03 (×9): qty 1

## 2021-09-03 MED ORDER — METOCLOPRAMIDE HCL 5 MG PO TABS
5.0000 mg | ORAL_TABLET | Freq: Two times a day (BID) | ORAL | Status: DC
  Administered 2021-09-03 – 2021-09-07 (×9): 5 mg via ORAL
  Filled 2021-09-03 (×9): qty 1

## 2021-09-03 MED ORDER — PANTOPRAZOLE SODIUM 40 MG PO TBEC
40.0000 mg | DELAYED_RELEASE_TABLET | Freq: Two times a day (BID) | ORAL | Status: DC
  Administered 2021-09-03 – 2021-09-07 (×9): 40 mg via ORAL
  Filled 2021-09-03 (×9): qty 1

## 2021-09-03 MED ORDER — BISACODYL 10 MG RE SUPP
10.0000 mg | Freq: Once | RECTAL | Status: AC
  Administered 2021-09-03: 13:00:00 10 mg via RECTAL
  Filled 2021-09-03: qty 1

## 2021-09-03 MED ORDER — METOCLOPRAMIDE HCL 5 MG PO TABS: 5.0000 mg | ORAL_TABLET | Freq: Every day | ORAL | Status: DC

## 2021-09-03 NOTE — Progress Notes (Signed)
Progress Note  Patient Name: Randy Liu Date of Encounter: 09/03/2021  Attending physician: Shelly Coss, MD Primary care provider: Burman Freestone, MD Primary Cardiologist: Rex Kras, DO, [facility]  Subjective: Randy Liu is a 70 y.o. male who was seen and examined at bedside  Wife present at bedside. Denies anginal discomfort. Nausea and vomiting (bilious). Case discussed and reviewed with his nurse.  Objective: Vital Signs in the last 24 hours: Temp:  [97.4 F (36.3 C)-97.9 F (36.6 C)] 97.7 F (36.5 C) (12/21 1614) Pulse Rate:  [64-80] 68 (12/21 1614) Resp:  [18-20] 18 (12/21 1614) BP: (110-145)/(56-108) 134/108 (12/21 1720) SpO2:  [95 %-97 %] 97 % (12/21 1614)  Intake/Output:  Intake/Output Summary (Last 24 hours) at 09/03/2021 1736 Last data filed at 09/03/2021 0440 Gross per 24 hour  Intake --  Output 1000 ml  Net -1000 ml    Net IO Since Admission: -1,674.73 mL [09/03/21 1736]  Weights:  Filed Weights   08/31/21 1327 08/31/21 2300  Weight: 123.8 kg 122.2 kg    Telemetry: Personally reviewed.  Sinus  Physical examination: PHYSICAL EXAM: Vitals with BMI 09/03/2021 09/03/2021 09/03/2021  Height - - -  Weight - - -  BMI - - -  Systolic [id number] [id number]  Diastolic [id number] 75 68  Pulse - 68 80    CONSTITUTIONAL: Appears older than stated age, hemodynamically stable, no acute distress.   SKIN: Skin is warm and dry. No rash noted. No cyanosis. No pallor. No jaundice HEAD: Normocephalic and atraumatic.  EYES: No scleral icterus MOUTH/THROAT: Dry oral membranes.  NECK: Difficult to evaluate JVP due to short neck stature and adipose tissue.  No thyromegaly noted. No carotid bruits  LYMPHATIC: No visible cervical adenopathy.  CHEST Normal respiratory effort. No intercostal retractions  LUNGS: Clear to auscultation bilaterally.  No stridor. No wheezes. No rales.  CARDIOVASCULAR: Regular rate and rhythm, positive S1-S2, no murmurs rubs or gallops  appreciated. ABDOMINAL: Obese, soft, nontender, nondistended, positive bowel sounds in all 4 quadrants, no apparent ascites.  EXTREMITIES: Trace bilateral edema, warm to touch, no peripheral edema  HEMATOLOGIC: No significant bruising NEUROLOGIC: Oriented to person, place, and time. Nonfocal. Normal muscle tone.  PSYCHIATRIC: Normal mood and affect. Normal behavior. Cooperative  Lab Results: Hematology Recent Labs  Lab 08/31/21 1325 09/01/21 0224  WBC 6.2 8.3  RBC 4.32 4.15*  HGB 13.4 12.6*  HCT 41.2 39.5  MCV 95.4 95.2  MCH 31.0 30.4  MCHC 32.5 31.9  RDW 15.6* 15.3  PLT 217 221    Chemistry Recent Labs  Lab 08/31/21 1325 09/01/21 0224  NA 138 136  K 5.0 4.6  CL 103 102  CO2 26 26  GLUCOSE 217* 169*  BUN 35* 28*  CREATININE 1.62* 1.58*  CALCIUM 9.6 9.4  PROT 7.3  --   ALBUMIN 3.6  --   AST 36  --   ALT 33  --   ALKPHOS 40  --   BILITOT 0.5  --   GFRNONAA 45* 46*  ANIONGAP 9 8     Cardiac Enzymes: Cardiac Panel (last 3 results) Recent Labs    09/01/21 0224 09/02/21 0204 09/02/21 0434  TROPONINIHS 934* 493* 580*    BNP (last 3 results) Recent Labs    08/09/21 1435 09/01/21 0224  BNP 138.4* 146.8*    ProBNP (last 3 results) No results for input(s): PROBNP in the last 8760 hours.   DDimer No results for input(s): DDIMER in the last 168 hours.   Hemoglobin A1c:  Lab Results  Component Value Date   HGBA1C 8.0 (H) 08/09/2021   MPG 183 08/09/2021    TSH No results for input(s): TSH in the last 8760 hours.  Lipid Panel     Component Value Date/Time   CHOL 223 (H) 08/10/2021 0204   TRIG 313 (H) 08/10/2021 0204   HDL 24 (L) 08/10/2021 0204   CHOLHDL 9.3 08/10/2021 0204   VLDL 63 (H) 08/10/2021 0204   LDLCALC 136 (H) 08/10/2021 0204    Imaging: No results found.  Imaging: CT ABDOMEN PELVIS W CONTRAST 08/31/2021 1. Grossly unchanged 8.2 cm left inferior pole renal cyst.  2. Stable small pericardial effusion.  3. Mild body wall  edema.  4. Hypodense lesion in the uncinate process of the pancreas, unchanged from 08/09/2021 and new from 06/10/2019. Recommend follow-up pancreatic MRI.  5. Aortic Atherosclerosis (ICD10-I70.0).   DG Chest Portable 08/31/2021 Interval removal of enteric tube. No focal consolidation, pleural effusion, or pneumothorax. Stable cardiomediastinal silhouette. Aortic calcifications. Slight rightward deviation of the trachea at the thoracic inlet, consistent with left thyroid goiter demonstrated on prior CT 02/29/2020.  IMPRESSION:  1. Interval removal of enteric tube.  2. No acute cardiopulmonary abnormality.  3. Aortic Atherosclerosis (ICD10-I70.0).   Cardiac Studies: Echocardiogram 08/10/2021:  1. Left ventricular ejection fraction, by estimation, is 55 to 60%. The  left ventricle has normal function. Left ventricular endocardial border  not optimally defined to evaluate regional wall motion. Left ventricular  diastolic parameters are  indeterminate 2D images were obtain on 08/09/2021 LVEF 45-50% w/ RWMA  along the anterolateral and inferolateral walls. Definity images were  obtained 08/10/2021 which notes improvement in LVEF 55-60% without  regional wall motion abnormalities.   2. Right ventricular systolic function is normal. The right ventricular  size is normal. Mildly increased right ventricular wall thickness.   3. A small pericardial effusion is present. There is no evidence of  cardiac tamponade.   4. The mitral valve is normal in structure. Trivial mitral valve  regurgitation. No evidence of mitral stenosis.   5. The aortic valve is tricuspid. Aortic valve regurgitation is not  visualized. No aortic stenosis is present.   6. The inferior vena cava is normal in size with <50% respiratory  variability, suggesting right atrial pressure of 8 mmHg.   Left Heart Catheterization 08/12/21:  LV: 113/6, EDP 18 mmHg.  Ao 112/50, mean 75 mmHg.  No pressure gradient across the aortic  valve. LM: Large vessel, 10 to 20% stenosis. LAD: Large vessel, it is occluded after giving origin to a small D1.  Mid to distal LAD is supplied by contralateral collaterals from the RCA.  D1 is diffusely diseased and is occluded in the mid to distal segment.  Mid to distal LAD appears to be widely patent with mild disease and a good target for bypass. RI: Small vessel, no significant disease. CX: Large vessel.  Gives origin to large OM1 which is a high-grade 95% proximal stenosis followed by 20 to 30% diffuse mid segment stenosis.  Distal vessel is a good target for bypass.  There is a large OM 2 that is ulcerated and occluded in the proximal and mid segment and appears to be diffusely diseased in the proximal and mid segment.  It is a large vessel and distal vessel appears to be a good target for bypass. RCA: Dominant, large vessel.  Gives origin to 2 PDA branches, proximal PDA branch is smooth and normal.  After this distal RCA has a high-grade 90  to 95% bifurcation stenosis involving the PL branch.  Distal RCA gives excellent collaterals to the LAD.   Impression: Mild increase in LVEDP.  LV gram not performed to conserve contrast.  Multivessel disease, ideally speaking would benefit from CABG.  However patient is essentially bedbound and wheelchair-bound due to obesity, peripheral neuropathy and generalized deconditioning.  Discussed with wife, patient does not appear to be a good candidate for CABG as his recovery would be extremely complicated.  Best option is to proceed with multivessel PCI electively, in spite of two-vessel disease, EF appears to be preserved.  I would like him to recuperate from his acute renal failure.  I have discussed all the options with patient's wife who is in agreement and favors conservative therapy/PCI over surgical therapy.  50 mL contrast utilized. Patient presently on Eliquis for PE, I have started him on aspirin along with Plavix for now.   EKG: 08/31/2021: Sinus  rhythm at a rate of 65 bpm.  Left axis.  Poor R wave progression, cannot exclude anteroseptal infarct old.  Subtle ST depression in lateral leads, unchanged compared to previous EKG 08/10/2021.   08/10/2021: NSR, 73bpm, subtle ST depression high lateral and lateral leads, PRWP, without injury pattern.  Scheduled Meds:  apixaban  5 mg Oral BID   aspirin EC  81 mg Oral Daily   carvedilol  3.125 mg Oral BID WC   feeding supplement (GLUCERNA SHAKE)  237 mL Oral TID BM   icosapent Ethyl  1 g Oral BID   insulin aspart  0-15 Units Subcutaneous TID WC & HS   insulin aspart protamine- aspart  40 Units Subcutaneous BID WC   latanoprost  1 drop Left Eye QHS   metoCLOPramide  5 mg Oral BID   pantoprazole  40 mg Oral BID   polyethylene glycol  17 g Oral Daily   ranolazine  500 mg Oral BID   senna  1 tablet Oral BID   sodium chloride flush  3 mL Intravenous Q12H    Continuous Infusions:  sodium chloride      PRN Meds: sodium chloride, acetaminophen **OR** acetaminophen, alum & mag hydroxide-simeth, nitroGLYCERIN, ondansetron **OR** ondansetron (ZOFRAN) IV, polyvinyl alcohol, senna-docusate, sodium chloride flush   IMPRESSION & RECOMMENDATIONS: Levie Jeanpierre is a 70 y.o. Caucasian male whose past medical history and cardiac risk factors include:  Insulin-dependent diabetes mellitus type 2, chronic HFpEF, hyperlipidemia (previous statin intolerance), hypertriglyceridemia, GERD, history of PE on oral anticoagulation, predominantly bedbound due to peripheral neuropathy due to diabetes and COVID-19 infection, obesity due to excess calories.  Impression: Non-STEMI Multivessel CAD Chronic HFpEF Hypertension with HFpEF with CKD Hyperlipidemia Insulin-dependent diabetes mellitus with complications of neuropathy and gastroparesis. Long-term insulin use History of PE on oral anticoagulation History of COVID-19 infection. Obesity due to excess calories. Bedbound  Plan: This is patient's  second hospitalization which was triggered by nausea and vomiting followed by chest pain.  Patient underwent diagnostic imaging which did not show evidence of partial small bowel obstruction or ileus per report and therefore cardiac evaluation was pursued.  High sensitive troponins were checked which were elevated and patient was treated for non-STEMI in the setting of known multivessel CAD (with IV Heparin).  Patient's chest discomfort has improved with up titration of PPIs, Maalox, Reglan, and antianginal therapy.  He is currently asymptomatic from a cardiovascular standpoint.  No significant arrhythmias on telemetry as of this morning.  Antianginal therapy includes beta-blocker and Ranexa.  If patient has elevated blood pressure could consider isosorbide  dinitrate in the future.  Educated on the importance of glycemic control, management of hypertriglyceridemia, and hyperlipidemia.  He was started on Vascepa as well as PCSK9 inhibitors.  Patient is noted to have multivessel CAD, bedbound at baseline, and no rest discomfort unless in the setting of GI upset.  I have reviewed the case with interventional cardiology during his hospitalization and they have also reviewed the films to see if any percutaneous interventions would be beneficial.  However, given the complexity of his CAD and chronic comorbid conditions medical management is recommended unless a change in clinical status.  Patient is not an ideal candidate for surgery.  Both patient and wife are agreeable and would like to continue medical therapy for symptom relief and improving his modifiable risk factors.  Cardiology will follow peripherally during his hospitalization.  Patient is unable to come to the office visits due to physical limitations as he is bedbound.  [name] informs me that the last office visit costed them approximately $1400 for him to be transported via EMS to the office.  Keeping that in mind I will schedule a  telehealth visit going forward until unless he needs to be seen in the office to help minimize cost to the patient.  [name] is thankful.  Outpatient visit is currently scheduled for 09/16/2021 this will be changed to telehealth.  Patient's questions and concerns were addressed to his satisfaction. He voices understanding of the instructions provided during this encounter.   This note was created using a voice recognition software as a result there may be grammatical errors inadvertently enclosed that do not reflect the nature of this encounter. Every attempt is made to correct such errors.  Mechele Claude [facility]  Pager: [phone] Office: [phone] 09/03/2021, 5:36 PM

## 2021-09-03 NOTE — Plan of Care (Signed)

## 2021-09-03 NOTE — Progress Notes (Signed)
RT added sterile water to pt's home CPAP and placed CPAP within pt's reach. Pt stated he will place himself on when he is ready. RT will monitor as needed.

## 2021-09-03 NOTE — Progress Notes (Signed)
Daily Rounding Note  09/03/2021, 11:15 AM  LOS: 2 days   SUBJECTIVE:   Chief complaint:  non bloody n/v, acute on chronic.  Gastroparesis.  N/V of green bilious material this AM.  Several d since last BM.   No abd pain  OBJECTIVE:         Vital signs in last 24 hours:    Temp:  [97.4 F (36.3 C)-98.3 F (36.8 C)] 97.4 F (36.3 C) (12/21 1101) Pulse Rate:  [64-80] 80 (12/21 1101) Resp:  [18-20] 20 (12/21 1101) BP: (110-135)/(56-72) 130/68 (12/21 1101) SpO2:  [95 %-97 %] 95 % (12/21 1101) Last BM Date: 08/30/21 Filed Weights   08/31/21 1327 08/31/21 2300  Weight: 123.8 kg 122.2 kg   General: looks comfortable, obese, not toxic or in distress   Heart: NSR in 70s Chest: no dyspnea or cough Abdomen: soft,  NT, BS active.    Extremities: trace LE edema Neuro/Psych:  oriented x 3.  Calm, pleasant.    Intake/Output from previous day: 12/20 0701 - 12/21 0700 In: -  Out: 1600 [Urine:1600]  Intake/Output this shift: No intake/output data recorded.  Lab Results: Recent Labs    08/31/21 1325 09/01/21 0224  WBC 6.2 8.3  HGB 13.4 12.6*  HCT 41.2 39.5  PLT 217 221   BMET Recent Labs    08/31/21 1325 09/01/21 0224  NA 138 136  K 5.0 4.6  CL 103 102  CO2 26 26  GLUCOSE 217* 169*  BUN 35* 28*  CREATININE 1.62* 1.58*  CALCIUM 9.6 9.4   LFT Recent Labs    08/31/21 1325  PROT 7.3  ALBUMIN 3.6  AST 36  ALT 33  ALKPHOS 40  BILITOT 0.5   PT/INR Recent Labs    08/31/21 2044  LABPROT 16.2*  INR 1.3*   Hepatitis Panel No results for input(s): HEPBSAG, HCVAB, HEPAIGM, HEPBIGM in the last 72 hours.  Studies/Results: No results found.  Scheduled Meds:  apixaban  5 mg Oral BID   aspirin EC  81 mg Oral Daily   carvedilol  3.125 mg Oral BID WC   feeding supplement (GLUCERNA SHAKE)  237 mL Oral TID BM   icosapent Ethyl  1 g Oral BID   insulin aspart  0-15 Units Subcutaneous TID WC & HS   insulin  aspart protamine- aspart  40 Units Subcutaneous BID WC   latanoprost  1 drop Left Eye QHS   metoCLOPramide  5 mg Oral QHS   pantoprazole  40 mg Oral BID   ranolazine  500 mg Oral BID   sodium chloride flush  3 mL Intravenous Q12H   Continuous Infusions:  sodium chloride     PRN Meds:.sodium chloride, acetaminophen **OR** acetaminophen, alum & mag hydroxide-simeth, nitroGLYCERIN, ondansetron **OR** ondansetron (ZOFRAN) IV, polyvinyl alcohol, senna-docusate, sodium chloride flush   ASSESMENT:   Acute on chronic nausea and vomiting in patient with established diagnosis of gastroparesis.  For now no plans for EGD.   PPI up dose to twice daily (uses Prevacid at home), Reglan at HS.  Wife corrected the record to say the GES study in [facility] was up to 20 yrs ago, not in 2020.    History ileus versus SBO, current CT negative for either.  Chronic constipation, several d w/O BM.    Pancreatic IPMN.  Multivessel CAD with angina.  Not a candidate for CABG due to comorbidities.  Cardiology waiting for renal function and medical problems to  improve before pursuing PCI.  Remains on Eliquis, 81 ASA.  Ranexa recently added.   PLAN     Add daytime dose Reglan 5 mg.  Give Dulcolax PR, if not effective: tap water enema.      Randy Liu  09/03/2021, 11:15 AM Phone [phone]

## 2021-09-03 NOTE — Progress Notes (Signed)
PROGRESS NOTE    Randy Liu  [id number] DOB: 11/27/1950 DOA: 08/31/2021 PCP: Burman Freestone, MD   Chief Complain: Nausea, vomiting, chest pain  Brief Narrative: Patient is a 70 year old male with history of diastolic congestive heart failure, diabetes type 2, hyperlipidemia, GERD, DVT, PE on anticoagulation who presented with complaints of abdominal pain, chest pain, nausea, vomiting.  He was recently admitted at [facility], found to have elevated troponin at the time and underwent left heart cath which showed multivessel coronary artery disease for which she was recommended medical management.  He had multiple episodes of nausea, vomiting, chest pressure at home.  On presentation his troponins were gradually increasing so he was transferred to [facility] for further evaluation.  Cardiology were consulted and following.  Started  on heparin drip,now stopped.  Patient also endorsed chronic history of gastroparesis, upper abdominal discomfort requesting GI consultation.  GI following and the plan is for EGD tomorrow or day after.  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  Elevated troponin at presentation.  Plan for medical management. Denies any  chest pain today.  Initially started on heparin stopped.  Continue as needed nitroglycerin for chest pain.  Cardiology started on Ranexa  Coronary artery disease: On Last admission, cardiac cath showed multivessel coronary disease, medical management has been recommended.  Cardiology following here.  On carvedilol, vascepa, losartan at home.  History of statin intolerance  Upper GI symptoms: Has long history of gastroparesis, upper GI symptoms with  N/V which could be contributing to chest pain.  Patient and wife requested for GI consultation. [facility] consulted.  Chest pain improved with Maalox, continue Protonix.  Also on reglan.Plan for EGD likely on Friday or tomorrow as per schedule.  History of diastolic congestive heart failure: Currently euvolemic, mildly elevated BNP.  Continue current medications  Diabetes type 2: Continue current insulin regimen.  Monitor blood sugars.On insulin at home  CKD stage IIIa: Continue function at baseline.  Follows with nephrology at [facility]  Hypertension: Takes carvedilol, losartan at home.  Currently blood pressure soft.  Dose of carvedilol decreased to 3.125 mg twice daily.Losartan on hold  Constipation:Continue bowel regimen  Debility/deconditioning: Chronically bedbound, nonambulatory due to severe neuropathy.  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  Appears noninfected, clean.  Wound care has been following.  No antibiotics indicated for now.           DVT prophylaxis:Eliquis Code Status: Full code Family Communication: Wife at the bedside Patient status: Inpatient  Dispo: The patient is from: Home  Anticipated d/c is to: Home               Anticipated d/c date is: Awaiting EGD  Consultants: Cardiology  Procedures: None  Antimicrobials:  Anti-infectives (From  admission, onward)    None       Subjective:  Patient seen and examined at bedside this mrng.He complained of nausea and vomited this mrng ,was dizzy but now feeling better. Complaining of lack of  bowel movement   Objective: Vitals:   09/03/21 0739 09/03/21 0957 09/03/21 1101 09/03/21 1614  BP: 133/71 131/72 130/68 (!) 145/75  Pulse: 65 69 80 68  Resp: 18  20 18   Temp:   (!) 97.4 F (36.3 C) 97.7 F (36.5 C)  TempSrc:   Oral Oral  SpO2: 97%  95% 97%  Weight:      Height:        Intake/Output Summary (Last 24 hours) at 09/03/2021 1641 Last data filed at 09/03/2021 0440 Gross per 24 hour  Intake --  Output 1000 ml  Net -1000 ml   Filed Weights   08/31/21 1327 08/31/21 2300  Weight: 123.8 kg 122.2 kg    Examination:  General exam: morbidly obese,deconditioned,chronically ill looking HEENT: PERRL Respiratory system:  no wheezes or crackles  Cardiovascular system: S1 & S2 heard, RRR.  Gastrointestinal system: Abdomen is nondistended, soft and nontender. Central nervous system: Alert and oriented Extremities: No edema, no clubbing ,no cyanosis Skin: No rashes, no ulcers,no icterus  ,  clean sebaceous cyst excision wound on the back   Data Reviewed: I have personally reviewed following labs and imaging studies  CBC: Recent Labs  Lab 08/31/21 1325 09/01/21 0224  WBC 6.2 8.3  NEUTROABS 4.0  --   HGB 13.4 12.6*  HCT 41.2 39.5  MCV 95.4 95.2  PLT 217 [id number]   Basic Metabolic Panel: Recent Labs  Lab 08/31/21 1325 09/01/21 0224  NA 138 136  K 5.0 4.6  CL 103 102  CO2 26 26  GLUCOSE 217* 169*  BUN 35* 28*  CREATININE 1.62* 1.58*  CALCIUM 9.6 9.4   GFR: Estimated Creatinine Clearance: 57.9 mL/min (A) (by C-G formula based on SCr of 1.58 mg/dL (H)). Liver Function Tests: Recent Labs  Lab 08/31/21 1325  AST 36  ALT 33  ALKPHOS 40  BILITOT 0.5  PROT 7.3  ALBUMIN 3.6   Recent Labs  Lab 08/31/21 1325  LIPASE 27   No results for input(s):  AMMONIA in the last 168 hours. Coagulation Profile: Recent Labs  Lab 08/31/21 2044  INR 1.3*   Cardiac Enzymes: No results for input(s): CKTOTAL, CKMB, CKMBINDEX, TROPONINI in the last 168 hours. BNP (last 3 results) No results for input(s): PROBNP in the last 8760 hours. HbA1C: No results for input(s): HGBA1C in the last 72 hours. CBG: Recent Labs  Lab 09/02/21 2126 09/03/21 0736 09/03/21 0917 09/03/21 1104 09/03/21 1611  GLUCAP 171* 182* 181* 184* 121*   Lipid Profile: No results for input(s): CHOL, HDL, LDLCALC, TRIG, CHOLHDL, LDLDIRECT in the last 72 hours. Thyroid Function Tests: No results for input(s): TSH, T4TOTAL, FREET4, T3FREE, THYROIDAB in the last 72 hours. Anemia Panel: No results for input(s): VITAMINB12, FOLATE, FERRITIN, TIBC, IRON, RETICCTPCT in the last 72 hours. Sepsis Labs: No results for input(s): PROCALCITON, LATICACIDVEN in the last 168 hours.  Recent Results (from the past 240 hour(s))  Resp Panel by RT-PCR (Flu A&B, Covid) Nasopharyngeal Swab     Status: None   Collection Time: 08/31/21  1:46  PM   Specimen: Nasopharyngeal Swab; Nasopharyngeal(NP) swabs in vial transport medium  Result Value Ref Range Status   SARS Coronavirus 2 by RT PCR NEGATIVE NEGATIVE Final    Comment: (NOTE) SARS-CoV-2 target nucleic acids are NOT DETECTED.  The SARS-CoV-2 RNA is generally detectable in upper respiratory specimens during the acute phase of infection. The lowest concentration of SARS-CoV-2 viral copies this assay can detect is 138 copies/mL. A negative result does not preclude SARS-Cov-2 infection and should not be used as the sole basis for treatment or other patient management decisions. A negative result may occur with  improper specimen collection/handling, submission of specimen other than nasopharyngeal swab, presence of viral mutation(s) within the areas targeted by this assay, and inadequate number of viral copies(<138 copies/mL). A negative  result must be combined with clinical observations, patient history, and epidemiological information. The expected result is Negative.  Fact Sheet for Patients:  BloggerCourse.com  Fact Sheet for Healthcare Providers:  SeriousBroker.it  This test is no t yet approved or cleared by the [place] FDA and  has been authorized for detection and/or diagnosis of SARS-CoV-2 by FDA under an Emergency Use Authorization (EUA). This EUA will remain  in effect (meaning this test can be used) for the duration of the COVID-19 declaration under Section 564(b)(1) of the Act, 21 U.S.C.section 360bbb-3(b)(1), unless the authorization is terminated  or revoked sooner.       Influenza A by PCR NEGATIVE NEGATIVE Final   Influenza B by PCR NEGATIVE NEGATIVE Final    Comment: (NOTE) The Xpert Xpress SARS-CoV-2/FLU/RSV plus assay is intended as an aid in the diagnosis of influenza from Nasopharyngeal swab specimens and should not be used as a sole basis for treatment. Nasal washings and aspirates are unacceptable for Xpert Xpress SARS-CoV-2/FLU/RSV testing.  Fact Sheet for Patients: BloggerCourse.com  Fact Sheet for Healthcare Providers: SeriousBroker.it  This test is not yet approved or cleared by the [place] FDA and has been authorized for detection and/or diagnosis of SARS-CoV-2 by FDA under an Emergency Use Authorization (EUA). This EUA will remain in effect (meaning this test can be used) for the duration of the COVID-19 declaration under Section 564(b)(1) of the Act, 21 U.S.C. section 360bbb-3(b)(1), unless the authorization is terminated or revoked.  Performed at [facility], [address] [place], [place] [address]          Radiology Studies: No results found.      Scheduled Meds:  apixaban  5 mg Oral BID   aspirin EC  81 mg Oral Daily   carvedilol   3.125 mg Oral BID WC   feeding supplement (GLUCERNA SHAKE)  237 mL Oral TID BM   icosapent Ethyl  1 g Oral BID   insulin aspart  0-15 Units Subcutaneous TID WC & HS   insulin aspart protamine- aspart  40 Units Subcutaneous BID WC   latanoprost  1 drop Left Eye QHS   metoCLOPramide  5 mg Oral BID   pantoprazole  40 mg Oral BID   polyethylene glycol  17 g Oral Daily   ranolazine  500 mg Oral BID   senna  1 tablet Oral BID   sodium chloride flush  3 mL Intravenous Q12H   Continuous Infusions:  sodium chloride       LOS: 2 days    Time spent:35 mins. More than 50% of that time was spent in counseling and/or coordination of care.      Burnadette Pop, MD [facility] P12/21/2022, 4:41 PM

## 2021-09-04 DIAGNOSIS — K3184 Gastroparesis: Secondary | ICD-10-CM | POA: Diagnosis not present

## 2021-09-04 DIAGNOSIS — I5032 Chronic diastolic (congestive) heart failure: Secondary | ICD-10-CM | POA: Diagnosis not present

## 2021-09-04 DIAGNOSIS — K259 Gastric ulcer, unspecified as acute or chronic, without hemorrhage or perforation: Secondary | ICD-10-CM | POA: Diagnosis not present

## 2021-09-04 DIAGNOSIS — Q399 Congenital malformation of esophagus, unspecified: Secondary | ICD-10-CM | POA: Diagnosis not present

## 2021-09-04 DIAGNOSIS — I214 Non-ST elevation (NSTEMI) myocardial infarction: Secondary | ICD-10-CM | POA: Diagnosis not present

## 2021-09-04 DIAGNOSIS — R112 Nausea with vomiting, unspecified: Secondary | ICD-10-CM | POA: Diagnosis not present

## 2021-09-04 LAB — GLUCOSE, CAPILLARY
Glucose-Capillary: 179 mg/dL — ABNORMAL HIGH (ref 70–99)
Glucose-Capillary: 217 mg/dL — ABNORMAL HIGH (ref 70–99)
Glucose-Capillary: 185 mg/dL — ABNORMAL HIGH (ref 70–99)
Glucose-Capillary: 90 mg/dL (ref 70–99)

## 2021-09-04 LAB — CBC WITH DIFFERENTIAL/PLATELET
Abs Immature Granulocytes: 0.04 10*3/uL (ref 0.00–0.07)
Basophils Absolute: 0 10*3/uL (ref 0.0–0.1)
Basophils Relative: 0 %
Eosinophils Absolute: 0.2 10*3/uL (ref 0.0–0.5)
Eosinophils Relative: 2 %
HCT: 40.7 % (ref 39.0–52.0)
Hemoglobin: 13.5 g/dL (ref 13.0–17.0)
Immature Granulocytes: 0 %
Lymphocytes Relative: 20 %
Lymphs Abs: 1.8 10*3/uL (ref 0.7–4.0)
MCH: 31.2 pg (ref 26.0–34.0)
MCHC: 33.2 g/dL (ref 30.0–36.0)
MCV: 94 fL (ref 80.0–100.0)
Monocytes Absolute: 0.9 10*3/uL (ref 0.1–1.0)
Monocytes Relative: 10 %
Neutro Abs: 6 10*3/uL (ref 1.7–7.7)
Neutrophils Relative %: 68 %
Platelets: 235 10*3/uL (ref 150–400)
RBC: 4.33 MIL/uL (ref 4.22–5.81)
RDW: 15 % (ref 11.5–15.5)
WBC: 9 10*3/uL (ref 4.0–10.5)
nRBC: 0 % (ref 0.0–0.2)

## 2021-09-04 LAB — BASIC METABOLIC PANEL
Anion gap: 10 (ref 5–15)
BUN: 23 mg/dL (ref 8–23)
CO2: 23 mmol/L (ref 22–32)
Calcium: 9.4 mg/dL (ref 8.9–10.3)
Chloride: 99 mmol/L (ref 98–111)
Creatinine, Ser: 1.44 mg/dL — ABNORMAL HIGH (ref 0.61–1.24)
GFR, Estimated: 52 mL/min — ABNORMAL LOW (ref >60)
Glucose, Bld: 138 mg/dL — ABNORMAL HIGH (ref 70–99)
Potassium: 4.4 mmol/L (ref 3.5–5.1)
Sodium: 132 mmol/L — ABNORMAL LOW (ref 135–145)

## 2021-09-04 MED ORDER — SODIUM CHLORIDE 0.9 % IV SOLN: INTRAVENOUS | Status: DC

## 2021-09-04 MED ORDER — BISACODYL 5 MG PO TBEC
10.0000 mg | DELAYED_RELEASE_TABLET | Freq: Once | ORAL | Status: AC
  Administered 2021-09-04: 13:00:00 10 mg via ORAL
  Filled 2021-09-04: qty 2

## 2021-09-04 NOTE — Plan of Care (Signed)
°  Problem: Education: Goal: Knowledge of General Education information will improve Description: Including pain rating scale, medication(s)/side effects and non-pharmacologic comfort measures Outcome: Progressing   Problem: Clinical Measurements: Goal: Ability to maintain clinical measurements within normal limits will improve Outcome: Progressing   Problem: Clinical Measurements: Goal: Cardiovascular complication will be avoided Outcome: Progressing   Problem: Clinical Measurements: Goal: Respiratory complications will improve Outcome: Progressing   Problem: Clinical Measurements: Goal: Diagnostic test results will improve Outcome: Progressing   Problem: Activity: Goal: Risk for activity intolerance will decrease Outcome: Progressing   Problem: Safety: Goal: Ability to remain free from injury will improve Outcome: Progressing   Problem: Skin Integrity: Goal: Risk for impaired skin integrity will decrease Outcome: Progressing   Problem: Elimination: Goal: Will not experience complications related to bowel motility Outcome: Progressing

## 2021-09-04 NOTE — Progress Notes (Signed)
° °       Daily Rounding Note ° °09/04/2021, 10:44 AM ° LOS: 3 days  ° °SUBJECTIVE:   °Chief complaint:   Chronic N/V, constipation  ° °Tolerating full liquids last night and this morning.  Had moderate brown stool yesterday following Dulcolax suppository.  No significant abdominal pain today.  Overall feeling better.  Asking if he will be able to go home tomorrow after the upper endoscopy. ° °OBJECTIVE:        ° Vital signs in last 24 hours:    °Temp:  [97.4 °F (36.3 °C)-98.1 °F (36.7 °C)] 98.1 °F (36.7 °C) (12/22 0410) °Pulse Rate:  [68-80] 72 (12/22 1006) °Resp:  [16-20] 18 (12/22 0410) °BP: (112-145)/(60-108) 119/64 (12/22 1006) °SpO2:  [95 %-97 %] 95 % (12/22 0410) °Last BM Date: 09/03/21 °Filed Weights  ° 08/31/21 1327 08/31/21 2300  °Weight: 123.8 kg 122.2 kg  ° °General: Looks about the same.  Not acutely ill-appearing.  A bit more alert today.  Comfortable. °Heart: RRR. °Chest: No labored breathing or cough °Abdomen: Nontender, nondistended.  Active bowel sounds. °Extremities: No CCE. °Neuro/Psych: Appropriate.  Not confused.  No tremors. ° °Intake/Output from previous day: °12/21 0701 - 12/22 0700 °In: 240 [P.O.:240] °Out: 700 [Urine:700] ° °Intake/Output this shift: °No intake/output data recorded. ° °Lab Results: °Recent Labs  °  09/04/21 °0359  °WBC 9.0  °HGB 13.5  °HCT 40.7  °PLT 235  ° °BMET °Recent Labs  °  09/04/21 °0359  °NA 132*  °K 4.4  °CL 99  °CO2 23  °GLUCOSE 138*  °BUN 23  °CREATININE 1.44*  °CALCIUM 9.4  ° °LFT °No results for input(s): PROT, ALBUMIN, AST, ALT, ALKPHOS, BILITOT, BILIDIR, IBILI in the last 72 hours. °PT/INR °No results for input(s): LABPROT, INR in the last 72 hours. °Hepatitis Panel °No results for input(s): HEPBSAG, HCVAB, HEPAIGM, HEPBIGM in the last 72 hours. ° °Studies/Results: °No results found. ° °Scheduled Meds: ° apixaban  5 mg Oral BID  ° aspirin EC  81 mg Oral Daily  ° carvedilol  3.125 mg Oral BID WC  ° feeding  supplement (GLUCERNA SHAKE)  237 mL Oral TID BM  ° icosapent Ethyl  1 g Oral BID  ° insulin aspart  0-15 Units Subcutaneous TID WC & HS  ° insulin aspart protamine- aspart  40 Units Subcutaneous BID WC  ° latanoprost  1 drop Left Eye QHS  ° metoCLOPramide  5 mg Oral BID  ° pantoprazole  40 mg Oral BID  ° polyethylene glycol  17 g Oral Daily  ° ranolazine  500 mg Oral BID  ° senna  1 tablet Oral BID  ° sodium chloride flush  3 mL Intravenous Q12H  ° °Continuous Infusions: ° sodium chloride    ° °PRN Meds:.sodium chloride, acetaminophen **OR** acetaminophen, alum & mag hydroxide-simeth, nitroGLYCERIN, ondansetron **OR** ondansetron (ZOFRAN) IV, polyvinyl alcohol, senna-docusate, sodium chloride flush ° ° °ASSESMENT:  ° °Acute on chronic nausea and vomiting in pt diagnosed with gastroparesis many years ago. ° °   Small bowel ileus, less likely PSBO.   Chronic constipation.  Suspect intestinal hypomotility. ° °Non-STEMI, multiple CAD.  Not a candidate for CABG.  Plan cardiac stenting when renal function and general health allows.  On Ranexa, apixaban. ° °IDDM. ° °Neuropathy causing loss of ambulation, wheelchair/bedbound. ° °Hyponatremia. ° °AKI, mild.  GFR 52 up from 45. ° ° °PLAN  ° °   EGD set for 1045 tomorrow.  Plans to hold Eliquis.  Full   liquids today, n.p.o. after midnight.  Placing orders for 1 dose of oral Dulcolax in addition to daily MiraLAX, senokot.  Leave Reglan 5 mg po bid in place.      Jennye Moccasin  09/04/2021, 10:44 AM Phone [phone]

## 2021-09-04 NOTE — Care Management Important Message (Signed)
Important Message  Patient Details  Name: Randy Liu MRN: [id number] Date of Birth: [birth year]   Medicare Important Message Given:  Yes     Renie Ora 09/04/2021, 11:30 AM

## 2021-09-04 NOTE — H&P (View-Only) (Signed)
Daily Rounding Note  09/04/2021, 10:44 AM  LOS: 3 days   SUBJECTIVE:   Chief complaint:   Chronic N/V, constipation   Tolerating full liquids last night and this morning.  Had moderate brown stool yesterday following Dulcolax suppository.  No significant abdominal pain today.  Overall feeling better.  Asking if he will be able to go home tomorrow after the upper endoscopy.  OBJECTIVE:         Vital signs in last 24 hours:    Temp:  [97.4 F (36.3 C)-98.1 F (36.7 C)] 98.1 F (36.7 C) (12/22 0410) Pulse Rate:  [68-80] 72 (12/22 1006) Resp:  [16-20] 18 (12/22 0410) BP: (112-145)/(60-108) 119/64 (12/22 1006) SpO2:  [95 %-97 %] 95 % (12/22 0410) Last BM Date: 09/03/21 Filed Weights   08/31/21 1327 08/31/21 2300  Weight: 123.8 kg 122.2 kg   General: Looks about the same.  Not acutely ill-appearing.  A bit more alert today.  Comfortable. Heart: RRR. Chest: No labored breathing or cough Abdomen: Nontender, nondistended.  Active bowel sounds. Extremities: No CCE. Neuro/Psych: Appropriate.  Not confused.  No tremors.  Intake/Output from previous day: 12/21 0701 - 12/22 0700 In: 240 [P.O.:240] Out: 700 [Urine:700]  Intake/Output this shift: No intake/output data recorded.  Lab Results: Recent Labs    09/04/21 0359  WBC 9.0  HGB 13.5  HCT 40.7  PLT 235   BMET Recent Labs    09/04/21 0359  NA 132*  K 4.4  CL 99  CO2 23  GLUCOSE 138*  BUN 23  CREATININE 1.44*  CALCIUM 9.4   LFT No results for input(s): PROT, ALBUMIN, AST, ALT, ALKPHOS, BILITOT, BILIDIR, IBILI in the last 72 hours. PT/INR No results for input(s): LABPROT, INR in the last 72 hours. Hepatitis Panel No results for input(s): HEPBSAG, HCVAB, HEPAIGM, HEPBIGM in the last 72 hours.  Studies/Results: No results found.  Scheduled Meds:  apixaban  5 mg Oral BID   aspirin EC  81 mg Oral Daily   carvedilol  3.125 mg Oral BID WC   feeding  supplement (GLUCERNA SHAKE)  237 mL Oral TID BM   icosapent Ethyl  1 g Oral BID   insulin aspart  0-15 Units Subcutaneous TID WC & HS   insulin aspart protamine- aspart  40 Units Subcutaneous BID WC   latanoprost  1 drop Left Eye QHS   metoCLOPramide  5 mg Oral BID   pantoprazole  40 mg Oral BID   polyethylene glycol  17 g Oral Daily   ranolazine  500 mg Oral BID   senna  1 tablet Oral BID   sodium chloride flush  3 mL Intravenous Q12H   Continuous Infusions:  sodium chloride     PRN Meds:.sodium chloride, acetaminophen **OR** acetaminophen, alum & mag hydroxide-simeth, nitroGLYCERIN, ondansetron **OR** ondansetron (ZOFRAN) IV, polyvinyl alcohol, senna-docusate, sodium chloride flush   ASSESMENT:   Acute on chronic nausea and vomiting in pt diagnosed with gastroparesis many years ago.     Small bowel ileus, less likely PSBO.   Chronic constipation.  Suspect intestinal hypomotility.  Non-STEMI, multiple CAD.  Not a candidate for CABG.  Plan cardiac stenting when renal function and general health allows.  On Ranexa, apixaban.  IDDM.  Neuropathy causing loss of ambulation, wheelchair/bedbound.  Hyponatremia.  AKI, mild.  GFR 52 up from 45.   PLAN      EGD set for 1045 tomorrow.  Plans to hold Eliquis.  Full  liquids today, n.p.o. after midnight.  Placing orders for 1 dose of oral Dulcolax in addition to daily MiraLAX, senokot.  Leave Reglan 5 mg po bid in place.      Randy Liu  09/04/2021, 10:44 AM Phone [phone]

## 2021-09-04 NOTE — Progress Notes (Signed)
PROGRESS NOTE    Randy Liu  [id number] DOB: Oct 31, 1950 DOA: 08/31/2021 PCP: Burman Freestone, MD   Chief complaint.  Nausea vomiting. Brief Narrative:  Patient is a 70 year old male with history of diastolic congestive heart failure, diabetes type 2, hyperlipidemia, GERD, DVT, PE on anticoagulation who presented with complaints of abdominal pain, chest pain, nausea, vomiting.  He was recently admitted at [facility], found to have elevated troponin at the time and underwent left heart cath which showed multivessel coronary artery disease for which she was recommended medical management.  He had multiple episodes of nausea, vomiting, chest pressure at home.  On presentation his troponins were gradually increasing so he was transferred to [facility] for further evaluation.  Cardiology were consulted and following.  Started  on heparin drip,now stopped.  Patient also endorsed chronic history of gastroparesis, upper abdominal discomfort requesting GI consultation.  GI following and the plan is for EGD 12/23.   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 Coronary artery disease. Patient has been evaluated by cardiology, patient has known coronary  disease, planning for conservative treatment. Patient currently does not have any chest pain.  Nausea vomiting secondary to diabetic gastroparesis. Patient condition is similar to his symptoms when he had a recurrent gastroparesis in the past.  Currently he is on liquid diet, nausea vomiting is getting better.  GI is planning for EGD tomorrow.  Most likely patient be discharged home tomorrow or Saturday.  Chronic diastolic congestive heart failure. Condition stable, no evidence of volume overload.  Type 2 diabetes. Continue current regimen.  Chronic kidney disease stage IIIa. Renal function still stable.  Morbid obesity. Patient has a BMI of 35.5 with multiple comorbidities.  History of PE/DVT. Continue Eliquis.   DVT prophylaxis: Eliquis Code Status: full Family Communication: Wife updated at the bedside. Disposition Plan:    Status is: Inpatient  Remains inpatient appropriate because: Severity of disease, pending procedure.        I/O last 3 completed shifts: In: 240 [P.O.:240] Out: 1700 [Urine:1700] No intake/output data recorded.     Consultants:   Cardiology.  GI.  Procedures: None  Antimicrobials: None  Subjective: Patient doing much better, he is on liquid diet, no additional nausea vomiting. Denies any additional chest pain, no short of breath or cough. No fever chills No dysuria or hematuria.  Objective: Vitals:   09/03/21 1947 09/03/21 2318 09/04/21 0410 09/04/21 1006  BP:  124/64 118/63 119/64  Pulse:  70 71 72  Resp: 16 18 18    Temp:  97.8 F (36.6 C) 98.1 F (36.7 C)   TempSrc:  Oral Oral   SpO2:  96% 95%   Weight:      Height:  Intake/Output Summary (Last 24 hours) at 09/04/2021 1424 Last data filed at 09/04/2021 0400 Gross per 24 hour  Intake 240 ml  Output 700 ml  Net -460 ml   Filed Weights   08/31/21 1327 08/31/21 2300  Weight: 123.8 kg 122.2 kg    Examination:  General exam: Appears calm and comfortable   Respiratory system: Clear to auscultation. Respiratory effort normal. Cardiovascular system: S1 & S2 heard, RRR. No JVD, murmurs, rubs, gallops or clicks. No pedal edema. Gastrointestinal system: Abdomen is nondistended, soft and nontender. No organomegaly or masses felt. Normal bowel sounds heard. Central nervous system: Alert and oriented. No focal neurological deficits. Extremities: Symmetric 5 x 5 power. Skin: No rashes, lesions or ulcers Psychiatry: Judgement and insight appear normal. Mood & affect appropriate.     Data Reviewed: I have personally reviewed following labs and imaging studies  CBC: Recent Labs  Lab 08/31/21 1325 09/01/21 0224 09/04/21 0359  WBC 6.2 8.3 9.0  NEUTROABS 4.0  --  6.0  HGB 13.4 12.6* 13.5  HCT 41.2 39.5 40.7  MCV 95.4 95.2 94.0  PLT 217 221 [id number]   Basic Metabolic Panel: Recent Labs  Lab 08/31/21 1325 09/01/21 0224 09/04/21 0359  NA 138 136 132*  K 5.0 4.6 4.4  CL 103 102 99  CO2 26 26 23   GLUCOSE 217* 169* 138*  BUN 35* 28* 23  CREATININE 1.62* 1.58* 1.44*  CALCIUM 9.6 9.4 9.4   GFR: Estimated Creatinine Clearance: 63.5 mL/min (A) (by C-G formula based on SCr of 1.44 mg/dL (H)). Liver Function Tests: Recent Labs  Lab 08/31/21 1325  AST 36  ALT 33  ALKPHOS 40  BILITOT 0.5  PROT 7.3  ALBUMIN 3.6   Recent Labs  Lab 08/31/21 1325  LIPASE 27   No results for input(s): AMMONIA in the last 168 hours. Coagulation Profile: Recent Labs  Lab 08/31/21 2044  INR 1.3*   Cardiac Enzymes: No results for input(s): CKTOTAL, CKMB, CKMBINDEX, TROPONINI in the last 168 hours. BNP (last 3 results) No results for input(s): PROBNP in the last 8760 hours. HbA1C: No results for input(s): HGBA1C in the last 72 hours. CBG: Recent Labs  Lab 09/03/21 1104 09/03/21 1611 09/03/21 2135 09/04/21 0749 09/04/21 1146  GLUCAP 184* 121* 152* 179* 217*   Lipid Profile: No results for input(s): CHOL, HDL, LDLCALC, TRIG, CHOLHDL, LDLDIRECT in  the last 72 hours. Thyroid Function Tests: No results for input(s): TSH, T4TOTAL, FREET4, T3FREE, THYROIDAB in the last 72 hours. Anemia Panel: No results for input(s): VITAMINB12, FOLATE, FERRITIN, TIBC, IRON, RETICCTPCT in the last 72 hours. Sepsis Labs: No results for input(s): PROCALCITON, LATICACIDVEN in the last 168 hours.  Recent Results (from the past 240 hour(s))  Resp Panel by RT-PCR (Flu A&B, Covid) Nasopharyngeal Swab     Status: None   Collection Time: 08/31/21  1:46 PM   Specimen: Nasopharyngeal Swab; Nasopharyngeal(NP) swabs in vial transport medium  Result Value Ref Range Status   SARS Coronavirus 2 by RT PCR NEGATIVE NEGATIVE Final    Comment: (NOTE) SARS-CoV-2 target nucleic acids are NOT DETECTED.  The SARS-CoV-2 RNA is generally detectable in upper respiratory specimens during the acute phase of infection. The lowest concentration of SARS-CoV-2 viral copies this assay can detect is 138 copies/mL. A negative result does not preclude SARS-Cov-2 infection and should not be used as the sole basis for treatment or other patient management decisions. A negative result may occur with  improper specimen collection/handling, submission of specimen other than  nasopharyngeal swab, presence of viral mutation(s) within the areas targeted by this assay, and inadequate number of viral copies(<138 copies/mL). A negative result must be combined with clinical observations, patient history, and epidemiological information. The expected result is Negative.  Fact Sheet for Patients:  BloggerCourse.com  Fact Sheet for Healthcare Providers:  SeriousBroker.it  This test is no t yet approved or cleared by the [place] FDA and  has been authorized for detection and/or diagnosis of SARS-CoV-2 by FDA under an Emergency Use Authorization (EUA). This EUA will remain  in effect (meaning this test can be used) for the duration of  the COVID-19 declaration under Section 564(b)(1) of the Act, 21 U.S.C.section 360bbb-3(b)(1), unless the authorization is terminated  or revoked sooner.       Influenza A by PCR NEGATIVE NEGATIVE Final   Influenza B by PCR NEGATIVE NEGATIVE Final    Comment: (NOTE) The Xpert Xpress SARS-CoV-2/FLU/RSV plus assay is intended as an aid in the diagnosis of influenza from Nasopharyngeal swab specimens and should not be used as a sole basis for treatment. Nasal washings and aspirates are unacceptable for Xpert Xpress SARS-CoV-2/FLU/RSV testing.  Fact Sheet for Patients: BloggerCourse.com  Fact Sheet for Healthcare Providers: SeriousBroker.it  This test is not yet approved or cleared by the [place] FDA and has been authorized for detection and/or diagnosis of SARS-CoV-2 by FDA under an Emergency Use Authorization (EUA). This EUA will remain in effect (meaning this test can be used) for the duration of the COVID-19 declaration under Section 564(b)(1) of the Act, 21 U.S.C. section 360bbb-3(b)(1), unless the authorization is terminated or revoked.  Performed at [facility], [address], [place], [place] [address]          Radiology Studies: No results found.      Scheduled Meds:  apixaban  5 mg Oral BID   aspirin EC  81 mg Oral Daily   carvedilol  3.125 mg Oral BID WC   feeding supplement (GLUCERNA SHAKE)  237 mL Oral TID BM   icosapent Ethyl  1 g Oral BID   insulin aspart  0-15 Units Subcutaneous TID WC & HS   insulin aspart protamine- aspart  40 Units Subcutaneous BID WC   latanoprost  1 drop Left Eye QHS   metoCLOPramide  5 mg Oral BID   pantoprazole  40 mg Oral BID   polyethylene glycol  17 g Oral Daily   ranolazine  500 mg Oral BID   senna  1 tablet Oral BID   sodium chloride flush  3 mL Intravenous Q12H   Continuous Infusions:  sodium chloride       LOS: 3 days    Time spent: 64  MINUTES    Marrion Coy, MD [facility]   To contact the attending provider between 7A-7P or the covering provider during after hours 7P-7A, please log into the web site [email or web address] and access using universal [place] password for that web site. If you do not have the password, please call the hospital operator.  09/04/2021, 2:24 PM

## 2021-09-04 NOTE — Progress Notes (Signed)
Physical Therapy Treatment Patient Details Name: Randy Liu MRN: [id number] DOB: [birth year] Today's Date: 09/04/2021   History of Present Illness Patient is a 70 year old male with history of diastolic congestive heart failure, diabetes type 2, hyperlipidemia, GERD, DVT, PE on anticoagulation who presented with complaints of abdominal pain, chest pain, nausea, vomiting and elevated troponins. Patient also endorsed chronic history of gastroparesis, upper abdominal discomfort requesting GI consultation. CT negative for ileus or SBO.    PT Comments    With encouragement of wife pt agreeable to attempt to sit EoB. Pt educated on gaze stabilization to minimize dizziness with positional change. Pt able to utilize with little to no dizziness with movement. Pt is modA for rolling and total A for sidelying to sit and sit to sidelying. Pt able to tolerate 10+ minutes sitting EoB working on UE/LE and core strengthening in seated. Once returned to bed pt able to assist in rolling for pericare due to male Purewick leakage. D/c plan remains appropriate at this time PT will continue to follow acutely.     Recommendations for follow up therapy are one component of a multi-disciplinary discharge planning process, led by the attending physician.  Recommendations may be updated based on patient status, additional functional criteria and insurance authorization.  Follow Up Recommendations  Home health PT     Assistance Recommended at Discharge Frequent or constant Supervision/Assistance  Equipment Recommendations  Other (comment) (hoyer lift)    Recommendations for Other Services       Precautions / Restrictions Precautions Precautions: Fall Precaution Comments: pt is nonambulatory at baseline Restrictions Weight Bearing Restrictions: No     Mobility  Bed Mobility Overal bed mobility: Needs Assistance Bed Mobility: Rolling;Sidelying to Sit;Sit to Sidelying Rolling: Mod assist;+2 for  physical assistance Sidelying to sit: Total assist;+2 for physical assistance     Sit to sidelying: Total assist;+2 for physical assistance General bed mobility comments: discussed use of gaze stabilization to decrease dizziness and nausea with positional change. Provided pt with target about 8 feet away.    Transfers                   General transfer comment: dependent, deferred this date        Balance Overall balance assessment: Needs assistance Sitting-balance support: Feet unsupported;Bilateral upper extremity supported;Single extremity supported Sitting balance-Leahy Scale: Poor Sitting balance - Comments: initially pt required external posterior support, eventually pt able to maintain balance with UE support holding onto foot board of bed for approximately 10 minutes                                    Cognition Arousal/Alertness: Awake/alert Behavior During Therapy: WFL for tasks assessed/performed Overall Cognitive Status: Within Functional Limits for tasks assessed                                          Exercises General Exercises - Upper Extremity Elbow Flexion: Strengthening Elbow Extension: Strengthening General Exercises - Lower Extremity Long Arc QuadSinclair Ship;Both;10 reps;Seated Hip Flexion/Marching: AAROM;Both;5 reps;Seated Toe Raises: AROM;Both;10 reps;Seated Heel Raises: AROM;Both;10 reps;Seated Other Exercises Other Exercises: core activation x5    General Comments General comments (skin integrity, edema, etc.): pt's wife present throughout session, provides encouragement      Pertinent Vitals/Pain Pain Assessment: No/denies pain  PT Goals (current goals can now be found in the care plan section) Acute Rehab PT Goals Patient Stated Goal: improved QOL PT Goal Formulation: With patient Time For Goal Achievement: 09/16/21 Potential to Achieve Goals: Good Progress towards PT goals: Progressing toward  goals    Frequency    Min 3X/week      PT Plan Current plan remains appropriate       AM-PAC PT "6 Clicks" Mobility   Outcome Measure  Help needed turning from your back to your side while in a flat bed without using bedrails?: Total Help needed moving from lying on your back to sitting on the side of a flat bed without using bedrails?: Total Help needed moving to and from a bed to a chair (including a wheelchair)?: Total Help needed standing up from a chair using your arms (e.g., wheelchair or bedside chair)?: Total Help needed to walk in hospital room?: Total Help needed climbing 3-5 steps with a railing? : Total 6 Click Score: 6    End of Session   Activity Tolerance: Patient tolerated treatment well Patient left: in bed;with call bell/phone within reach;with bed alarm set;with family/visitor present Nurse Communication: Mobility status PT Visit Diagnosis: Other abnormalities of gait and mobility (R26.89);Muscle weakness (generalized) (M62.81);Difficulty in walking, not elsewhere classified (R26.2);Other symptoms and signs involving the nervous system (I71.245)     Time: 1339-1410 PT Time Calculation (min) (ACUTE ONLY): 31 min  Charges:  $Therapeutic Exercise: 8-22 mins $Therapeutic Activity: 8-22 mins                     Quayshawn Nin B. Beverely Risen PT, DPT Acute Rehabilitation Services Pager [phone] Office [phone]    Elon Alas Fleet 09/04/2021, 4:14 PM

## 2021-09-05 ENCOUNTER — Inpatient Hospital Stay (HOSPITAL_COMMUNITY): Payer: Medicare Other | Admitting: Certified Registered Nurse Anesthetist

## 2021-09-05 ENCOUNTER — Encounter (HOSPITAL_COMMUNITY)
Admission: EM | Disposition: A | Discharge status: Home Health | Payer: Self-pay | Source: Home / Self Care | Attending: Internal Medicine

## 2021-09-05 ENCOUNTER — Encounter (HOSPITAL_COMMUNITY): Payer: Self-pay | Admitting: Internal Medicine

## 2021-09-05 DIAGNOSIS — K317 Polyp of stomach and duodenum: Secondary | ICD-10-CM | POA: Diagnosis not present

## 2021-09-05 DIAGNOSIS — R778 Other specified abnormalities of plasma proteins: Secondary | ICD-10-CM | POA: Diagnosis not present

## 2021-09-05 DIAGNOSIS — I5032 Chronic diastolic (congestive) heart failure: Secondary | ICD-10-CM | POA: Diagnosis not present

## 2021-09-05 DIAGNOSIS — K259 Gastric ulcer, unspecified as acute or chronic, without hemorrhage or perforation: Secondary | ICD-10-CM | POA: Diagnosis not present

## 2021-09-05 DIAGNOSIS — K3184 Gastroparesis: Secondary | ICD-10-CM | POA: Diagnosis not present

## 2021-09-05 DIAGNOSIS — K2970 Gastritis, unspecified, without bleeding: Secondary | ICD-10-CM | POA: Diagnosis not present

## 2021-09-05 DIAGNOSIS — R112 Nausea with vomiting, unspecified: Secondary | ICD-10-CM | POA: Diagnosis not present

## 2021-09-05 DIAGNOSIS — Q399 Congenital malformation of esophagus, unspecified: Secondary | ICD-10-CM | POA: Diagnosis not present

## 2021-09-05 HISTORY — PX: POLYPECTOMY: SHX5525

## 2021-09-05 HISTORY — PX: BIOPSY: SHX5522

## 2021-09-05 HISTORY — PX: HEMOSTASIS CLIP PLACEMENT: SHX6857

## 2021-09-05 HISTORY — PX: ESOPHAGOGASTRODUODENOSCOPY: SHX5428

## 2021-09-05 LAB — GLUCOSE, CAPILLARY
Glucose-Capillary: 151 mg/dL — ABNORMAL HIGH (ref 70–99)
Glucose-Capillary: 143 mg/dL — ABNORMAL HIGH (ref 70–99)
Glucose-Capillary: 174 mg/dL — ABNORMAL HIGH (ref 70–99)
Glucose-Capillary: 165 mg/dL — ABNORMAL HIGH (ref 70–99)
Glucose-Capillary: 217 mg/dL — ABNORMAL HIGH (ref 70–99)

## 2021-09-05 SURGERY — EGD (ESOPHAGOGASTRODUODENOSCOPY)
Anesthesia: Monitor Anesthesia Care

## 2021-09-05 MED ORDER — LACTATED RINGERS IV SOLN: INTRAVENOUS | Status: DC

## 2021-09-05 MED ORDER — LACTATED RINGERS IV SOLN
INTRAVENOUS | Status: AC | PRN
  Administered 2021-09-05: 1000 mL via INTRAVENOUS

## 2021-09-05 MED ORDER — PHENYLEPHRINE 40 MCG/ML (10ML) SYRINGE FOR IV PUSH (FOR BLOOD PRESSURE SUPPORT)
PREFILLED_SYRINGE | INTRAVENOUS | Status: DC | PRN
  Administered 2021-09-05 (×2): 160 ug via INTRAVENOUS
  Administered 2021-09-05: 80 ug via INTRAVENOUS

## 2021-09-05 MED ORDER — INSULIN ASPART 100 UNIT/ML IJ SOLN
0.0000 [IU] | INTRAMUSCULAR | Status: DC
Start: 2021-09-06 — End: 2021-09-05

## 2021-09-05 MED ORDER — PROPOFOL 500 MG/50ML IV EMUL
INTRAVENOUS | Status: DC | PRN
  Administered 2021-09-05: 100 ug/kg/min via INTRAVENOUS

## 2021-09-05 MED ORDER — PANTOPRAZOLE SODIUM 40 MG PO TBEC: 40.0000 mg | DELAYED_RELEASE_TABLET | Freq: Two times a day (BID) | ORAL | 0 refills | Status: AC

## 2021-09-05 MED ORDER — INSULIN NPH (HUMAN) (ISOPHANE) 100 UNIT/ML ~~LOC~~ SUSP
20.0000 [IU] | Freq: Once | SUBCUTANEOUS | Status: DC
  Filled 2021-09-05: qty 10

## 2021-09-05 MED ORDER — INSULIN NPH (HUMAN) (ISOPHANE) 100 UNIT/ML ~~LOC~~ SUSP
28.0000 [IU] | Freq: Once | SUBCUTANEOUS | Status: DC
Start: 2021-09-05 — End: 2021-09-05

## 2021-09-05 MED ORDER — PHENYLEPHRINE 40 MCG/ML (10ML) SYRINGE FOR IV PUSH (FOR BLOOD PRESSURE SUPPORT)
PREFILLED_SYRINGE | INTRAVENOUS | Status: AC
  Filled 2021-09-05: qty 10

## 2021-09-05 MED ORDER — INSULIN NPH (HUMAN) (ISOPHANE) 100 UNIT/ML ~~LOC~~ SUSP
20.0000 [IU] | Freq: Two times a day (BID) | SUBCUTANEOUS | Status: DC
  Administered 2021-09-05 – 2021-09-07 (×4): 20 [IU] via SUBCUTANEOUS
  Filled 2021-09-05: qty 10

## 2021-09-05 MED ORDER — INSULIN ASPART 100 UNIT/ML IJ SOLN
0.0000 [IU] | INTRAMUSCULAR | Status: DC
  Administered 2021-09-05: 22:00:00 3 [IU] via SUBCUTANEOUS
  Administered 2021-09-06: 22:00:00 1 [IU] via SUBCUTANEOUS
  Administered 2021-09-06 (×4): 2 [IU] via SUBCUTANEOUS
  Administered 2021-09-06: 09:00:00 1 [IU] via SUBCUTANEOUS
  Administered 2021-09-07: 12:00:00 2 [IU] via SUBCUTANEOUS
  Administered 2021-09-07: 01:00:00 1 [IU] via SUBCUTANEOUS

## 2021-09-05 MED ORDER — CARVEDILOL 3.125 MG PO TABS
3.1250 mg | ORAL_TABLET | Freq: Two times a day (BID) | ORAL | 0 refills | Status: DC
Start: 2021-09-05 — End: 2021-10-02

## 2021-09-05 MED ORDER — POLYETHYLENE GLYCOL 3350 17 G PO PACK: 17.0000 g | PACK | Freq: Every day | ORAL | 0 refills | Status: DC

## 2021-09-05 MED ORDER — SUCRALFATE 1 GM/10ML PO SUSP
1.0000 g | Freq: Two times a day (BID) | ORAL | Status: DC
  Administered 2021-09-05 – 2021-09-07 (×5): 1 g via ORAL
  Filled 2021-09-05 (×5): qty 10

## 2021-09-05 MED ORDER — LIDOCAINE 2% (20 MG/ML) 5 ML SYRINGE
INTRAMUSCULAR | Status: DC | PRN
  Administered 2021-09-05: 50 mg via INTRAVENOUS

## 2021-09-05 MED ORDER — PROPOFOL 10 MG/ML IV BOLUS
INTRAVENOUS | Status: DC | PRN
  Administered 2021-09-05: 20 mg via INTRAVENOUS

## 2021-09-05 MED ORDER — ONDANSETRON 4 MG PO TBDP: 4.0000 mg | ORAL_TABLET | Freq: Three times a day (TID) | ORAL | 0 refills | Status: AC | PRN

## 2021-09-05 MED ORDER — SUCRALFATE 1 GM/10ML PO SUSP: 1.0000 g | Freq: Two times a day (BID) | ORAL | 0 refills | Status: AC

## 2021-09-05 MED ORDER — RANOLAZINE ER 500 MG PO TB12: 500.0000 mg | ORAL_TABLET | Freq: Two times a day (BID) | ORAL | 0 refills | Status: DC

## 2021-09-05 NOTE — Significant Event (Addendum)
As RN was going to DC pt, he started having vomiting again.  Will cancel DC for the moment, and put on IVF.  Update: pt not eating, missed the 70/30 earlier, CBG 215.  Will DC the AC/HS SSI and 70/30.  Instead putting him on NPH 20u BID and sensitive SSI Q4H.  (Due to not really able to take PO from N/V at the moment).

## 2021-09-05 NOTE — Progress Notes (Signed)
Patient has home cpap for use and will self place when ready.  

## 2021-09-05 NOTE — Anesthesia Preprocedure Evaluation (Signed)
Anesthesia Evaluation  Patient identified by MRN, date of birth, ID band Patient awake    Reviewed: Allergy & Precautions, H&P , NPO status , Patient's Chart, lab work & pertinent test results  Airway Mallampati: II   Neck ROM: full    Dental   Pulmonary asthma , sleep apnea , former smoker,    breath sounds clear to auscultation       Cardiovascular hypertension, + angina + CAD, + Past MI, +CHF and + DVT   Rhythm:regular Rate:Normal  Recent NSTEMI. Cath reveals multivessel CAD.  Cardiology states that pt would benefit from CABG, but is a poor candidate.  Plan is medical management.  Pt has preserved EF.   Neuro/Psych    GI/Hepatic   Endo/Other  diabetes, Type 2  Renal/GU Renal InsufficiencyRenal disease     Musculoskeletal   Abdominal   Peds  Hematology   Anesthesia Other Findings   Reproductive/Obstetrics                             Anesthesia Physical Anesthesia Plan  ASA: 3  Anesthesia Plan: MAC   Post-op Pain Management:    Induction: Intravenous  PONV Risk Score and Plan: 1 and Propofol infusion and Treatment may vary due to age or medical condition  Airway Management Planned: Nasal Cannula  Additional Equipment:   Intra-op Plan:   Post-operative Plan:   Informed Consent: I have reviewed the patients History and Physical, chart, labs and discussed the procedure including the risks, benefits and alternatives for the proposed anesthesia with the patient or authorized representative who has indicated his/her understanding and acceptance.     Dental advisory given  Plan Discussed with: CRNA, Anesthesiologist and Surgeon  Anesthesia Plan Comments:         Anesthesia Quick Evaluation

## 2021-09-05 NOTE — Progress Notes (Addendum)
Patient and wife, [name], received verbal and written discharge instructions. All questions answered at this time. Wife, [name], will be taking all patient care belongings and personal devices home (CPAP machine and hearing aids) with her in a private vehicle. Patient will be transported via [facility] to private residence.

## 2021-09-05 NOTE — TOC Transition Note (Signed)
Transition of Care [facility]) - CM/SW Discharge Note   Patient Details  Name: Randy Liu MRN: [id number] Date of Birth: June 19, 1951  Transition of Care [facility]) CM/SW Contact:  Gala Lewandowsky, RN Phone Number: 09/05/2021, 1:41 PM   Clinical Narrative: Case Manager received order for hoyer lift for home. Case Manager called spouse and she is agreeable to equipment. Referral submitted to [employer] for home delivery. Chip Boer is aware that the patient will transition home today. Wife has asked for either [facility] or [facility] transportation home- wife aware that transportation may be delayed due to the time of day. CSW called [facility] for transport. No further needs identified.    Final next level of care: Home w Home Health Services Barriers to Discharge: No Barriers Identified   Patient Goals and CMS Choice Patient states their goals for this hospitalization and ongoing recovery are:: to return home     Discharge Plan and Services In-house Referral: NA Discharge Planning Services: CM Consult Post Acute Care Choice: Home Health, Resumption of Svcs/PTA Provider          DME Arranged: N/A DME Agency: NA       HH Arranged: RN, Disease Management, PT, OT HH Agency: [facility] Date HH Agency Contacted: 09/02/21 Time HH Agency Contacted: 1536 Representative spoke with at [facility] Agency: Angela   Readmission Risk Interventions Readmission Risk Prevention Plan 09/02/2021 08/13/2021  Transportation Screening Complete Complete  PCP or Specialist Appt within 3-5 Days - Complete  HRI or Home Care Consult - Complete  Social Work Consult for Recovery Care Planning/Counseling - Complete  Palliative Care Screening - Not Applicable  Medication Review [employer]) Complete Complete  HRI or Home Care Consult Complete -  SW Recovery Care/Counseling Consult Complete -  Palliative Care Screening Not Applicable -  Skilled Nursing Facility Not Applicable -  Some recent data might  be hidden

## 2021-09-05 NOTE — Transfer of Care (Signed)
Immediate Anesthesia Transfer of Care Note  Patient: Randy Liu  Procedure(s) Performed: ESOPHAGOGASTRODUODENOSCOPY (EGD) BIOPSY POLYPECTOMY HEMOSTASIS CLIP PLACEMENT  Patient Location: PACU  Anesthesia Type:MAC  Level of Consciousness: awake and drowsy  Airway & Oxygen Therapy: Patient Spontanous Breathing and Patient connected to nasal cannula oxygen  Post-op Assessment: Report given to RN and Post -op Vital signs reviewed and stable  Post vital signs: Reviewed and stable  Last Vitals:  Vitals Value Taken Time  BP 114/55 09/05/21 0935  Temp 36.6 C 09/05/21 0930  Pulse 77 09/05/21 0936  Resp 21 09/05/21 0936  SpO2 95 % 09/05/21 0936  Vitals shown include unvalidated device data.  Last Pain:  Vitals:   09/05/21 0813  TempSrc: Oral  PainSc: 0-No pain      Patients Stated Pain Goal: 0 (27/74/12 8786)  Complications: No notable events documented.

## 2021-09-05 NOTE — Op Note (Signed)
[facility] Patient Name: Randy Liu Procedure Date : 09/05/2021 MRN: [id number] Attending MD: Justice Britain , MD Date of Birth: 07/01/1951 CSN: [id number] Age: 70 Admit Type: Inpatient Procedure:                Upper GI endoscopy Indications:              Indigestion, Gastroparesis, Unexplained chest pain,                            Nausea with vomiting Providers:                Justice Britain, MD, Jerene Pitch Person, Tyna Jaksch Technician Referring MD:             [facility] Medicines:                Monitored Anesthesia Care Complications:            No immediate complications. Estimated Blood Loss:     Estimated blood loss was minimal. Procedure:                Pre-Anesthesia Assessment:                           - Prior to the procedure, a History and Physical                            was performed, and patient medications and                            allergies were reviewed. The patient's tolerance of                            previous anesthesia was also reviewed. The risks                            and benefits of the procedure and the sedation                            options and risks were discussed with the patient.                            All questions were answered, and informed consent                            was obtained. Prior Anticoagulants: The patient has                            taken Eliquis (apixaban), last dose was 1 day prior                            to procedure. ASA Grade Assessment: III - A patient  with severe systemic disease. After reviewing the                            risks and benefits, the patient was deemed in                            satisfactory condition to undergo the procedure.                           After obtaining informed consent, the endoscope was                            passed under direct vision. Throughout the                             procedure, the patient's blood pressure, pulse, and                            oxygen saturations were monitored continuously. The                            GIF-H190 ([id number]) Olympus endoscope was introduced                            through the mouth, and advanced to the second part                            of duodenum. The upper GI endoscopy was                            accomplished without difficulty. The patient                            tolerated the procedure. Scope In: Scope Out: Findings:      The examined esophagus was significantly tortuous.      Normal mucosa was found in the entire esophagus however. Biopsies were       taken with a cold forceps for histology to rule out EoE.      The Z-line was regular and was found 45 cm from the incisors.      Two non-bleeding superficial gastric ulcers with a clean ulcer base       (Forrest Class III) were found on the greater curvature of the stomach.       The largest lesion was 7 mm in largest dimension.      Segmental moderate inflammation characterized by nodular erosions,       erythema and granularity were found in the entire examined stomach.       Biopsies were taken with a cold forceps for histology and Helicobacter       pylori testing.      The pylorus was normal.      A single 8 mm sessile polyp with no bleeding was found in the second       portion of the duodenum directly across from the major papilla. The       polyp was removed with a cold snare. Resection  and retrieval were       complete. To prevent bleeding after the polypectomy, three hemostatic       clips were successfully placed (MR conditional). There was no bleeding       at the end of the procedure.      A large diverticulum was found within the region of the major papilla.      No other gross lesions were noted in the duodenal bulb, in the first       portion of the duodenum and in the second portion of the duodenum.       Biopsies  were taken with a cold forceps for histology. Impression:               - Tortuous esophagus. Though overall normal mucosa                            was found in the entire esophagus - biopsied.                           - Z-line regular, 45 cm from the incisors.                           - 2 non-bleeding gastric ulcers with a clean ulcer                            base (Forrest Class III). Gastritis with some                            nodular erosions also present. Biopsied.                           - Normal pylorus.                           - A single duodenal polyp. Resected and retrieved.                            Clips (MR conditional) were placed.                           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                           - No other gross lesions in the duodenal bulb, in                            the first portion of the duodenum and in the second                            portion of the duodenum. Biopsied. Recommendation:           - The patient will be observed post-procedure,                            until all discharge criteria are met.                           -  Return patient to hospital ward for ongoing care.                           - Advance diet as tolerated.                           - We did today's procedure on anticoagulation                            having been received yesterday. Although would be                            ideal to hold anticoagulation for 24 hours, with                            his clinical case and underlying heart disease, I                            am not sure that we can do that (reason why we did                            procedure today on blood thinner having been                            received), so OK to continue anticoagulation this                            evening.                           - Recommend continuing PPI twice daily.                           - Add Carafate twice daily for  81-monthto aid in                            healing.                           - Continue Gastroparesis treatment with Reglan as                            currently in the hospital as outpatient.                           - Await pathology results.                           - Observe patient's clinical course.                           - [facility].                           - If pathology shows evidence of duodenal adenoma                            then recommend within 1 year a follow up of the                            site, if the patient is clinically stable from a                            cardiovascular perspective.                           - The findings and recommendations were discussed                            with the patient.                           - The findings and recommendations were discussed                            with the patient's family.                           - The findings and recommendations were discussed                            with the referring physician. Procedure Code(s):        --- Professional ---                           [id number], Esophagogastroduodenoscopy, flexible,                            transoral; with removal of tumor(s), polyp(s), or                            other lesion(s) by snare technique Diagnosis Code(s):        --- Professional ---                           Q39.9, Congenital malformation of esophagus,                            unspecified                           K25.9, Gastric ulcer, unspecified as acute or                            chronic, without hemorrhage or perforation                           K29.70,  Gastritis, unspecified, without bleeding                           K31.7, Polyp of stomach and duodenum                           K30, Functional dyspepsia                           K31.84,  Gastroparesis                           R07.9, Chest pain, unspecified                           R11.2, Nausea with vomiting, unspecified                           K57.10, Diverticulosis of small intestine without                            perforation or abscess without bleeding CPT copyright 2019 American [employer] Association. All rights reserved. The codes documented in this report are preliminary and upon coder review may  be revised to meet current compliance requirements. Justice Britain, MD 09/05/2021 9:38:50 AM Number of Addenda: 0

## 2021-09-05 NOTE — Interval H&P Note (Signed)
History and Physical Interval Note:  09/05/2021 8:53 AM  Randy Liu  has presented today for surgery, with the diagnosis of Abodminal Pain, Nausea, Vomiting, Atypical chest pain in setting of recent Demand ischemia.  The various methods of treatment have been discussed with the patient and family. After consideration of risks, benefits and other options for treatment, the patient has consented to  Procedure(s): ESOPHAGOGASTRODUODENOSCOPY (EGD) (N/A) as a surgical intervention.  The patient's history has been reviewed, patient examined, no change in status, stable for surgery.  I have reviewed the patient's chart and labs.  Questions were answered to the patient's satisfaction.     [employer]

## 2021-09-05 NOTE — Anesthesia Postprocedure Evaluation (Signed)
Anesthesia Post Note  Patient: Rowdy Guerrini  Procedure(s) Performed: ESOPHAGOGASTRODUODENOSCOPY (EGD) BIOPSY POLYPECTOMY HEMOSTASIS CLIP PLACEMENT     Patient location during evaluation: Endoscopy Anesthesia Type: MAC Level of consciousness: awake and alert Pain management: pain level controlled Vital Signs Assessment: post-procedure vital signs reviewed and stable Respiratory status: spontaneous breathing, nonlabored ventilation, respiratory function stable and patient connected to nasal cannula oxygen Cardiovascular status: stable and blood pressure returned to baseline Postop Assessment: no apparent nausea or vomiting Anesthetic complications: no   No notable events documented.  Last Vitals:  Vitals:   09/05/21 0945 09/05/21 1121  BP: (!) 98/55 127/68  Pulse: 70 69  Resp: 14 15  Temp: 36.7 C 36.4 C  SpO2: 95% 97%    Last Pain:  Vitals:   09/05/21 1121  TempSrc: Oral  PainSc:                  [place]

## 2021-09-05 NOTE — Discharge Summary (Addendum)
Physician Discharge Summary  Patient ID: Randy Liu MRN: [id number] DOB/AGE: [birth year] 70 y.o.  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  He was recently admitted at [facility], found to have elevated troponin at the time and underwent left heart cath which showed multivessel coronary artery disease for which she was recommended medical management.  He had multiple episodes of nausea, vomiting, chest pressure at home.  On presentation his troponins were gradually increasing so he was transferred to [facility] for further evaluation.  Cardiology were consulted and following.  Started  on heparin drip,now stopped.  Patient also endorsed chronic history of gastroparesis, upper abdominal discomfort requesting GI consultation.  EGD was performed, showed 2 nonbleeding ulcers, gastritis.  Patient be treated with sucralfate as well as PPI  twice a day.  Follow-up with GI as outpatient.  Non-STEMI with chest pain. Coronary artery disease. Patient has been evaluated by cardiology, patient has known coronary disease, planning for conservative treatment. Patient currently does not have any chest pain. 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 Gastric ulcer disease. He was treated with Reglan, PPI and sucralfate.  Patient is symptomatically improved.  Advance to full liquid diet today, at discharge, will place patient on soft diet and advance back to heart healthy diet when condition improves. Patient be followed by GI as outpatient.   Chronic diastolic congestive heart failure. Condition stable, no evidence of volume overload.  Type 2 diabetes. Continue current regimen.  Chronic kidney disease stage IIIa. Renal function still stable.  Morbid obesity. Patient has a BMI of 35.5 with multiple comorbidities.   History of PE/DVT. Continue Eliquis.       Consults: cardiology and GI  Significant Diagnostic Studies:  EGD Tortuous esophagus. Though overall normal mucosa was found in the entire esophagus - biopsied. - Z-line regular, 45 cm from the incisors. - 2 non-bleeding gastric ulcers with a clean ulcer base (Forrest Class III). Gastritis with some nodular erosions also present. Biopsied. - Normal pylorus. - A single duodenal polyp. Resected and retrieved. Clips (MR conditional) were placed. 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 - No other gross lesions in the duodenal bulb, in the first portion of the duodenum and in the second portion of the duodenum. Biopsied. Impression: - The patient will be observed post-procedure, until all discharge criteria are met. - Return patient to hospital ward for ongoing care. - Advance diet  as tolerated. - We did today's procedure on anticoagulation having been received  yesterday. Although would be ideal to hold anticoagulation for 24 hours, with his clinical case and underlying heart disease, I am not sure that we can do that (reason why we did procedure today on blood thinner having been received), so OK to continue anticoagulation this evening. - Recommend continuing PPI twice daily. - Add Carafate twice daily for 5-monthto aid in healing. - Continue Gastroparesis treatment with Reglan as currently in the hospital as outpatient. - Await pathology results. - Observe patient's clinical course. - [facility]. - If pathology shows evidence of duodenal adenoma then recommend within 1 year a follow up of the site, if the patient is clinically stable from a cardiovascular perspective  Treatments: EGD, symptomatic treatment  Discharge Exam: Blood pressure 127/68, pulse 69, temperature 97.6 F (36.4 C), temperature source Oral, resp. rate 15, height _0  (1.854 m), weight 122.2 kg, SpO2 97 %. General appearance: alert and cooperative Resp: clear to auscultation bilaterally Cardio: regular rate and rhythm, S1, S2 normal, no murmur, click, rub or gallop GI: soft, non-tender; bowel sounds normal; no masses,  no organomegaly Extremities: extremities normal, atraumatic, no cyanosis or edema  Disposition: Discharge disposition: 01-Home or Self Care       Discharge Instructions     Diet general   Complete by: As directed    Soft die x 2 days then heart healthy dist   Discharge wound care:   Complete by: As directed    Clean with saline, pack with moistured gauze, cover with foam dressing   Increase activity slowly   Complete by: As directed       Allergies as of 09/05/2021       Reactions   Metoclopramide Itching   Loss of Balance; Urinary Incontinence   Nsaids    Other reaction(s): Other (See Comments) Renal Insufficiency Kidney issues   Amoxicillin-pot  Clavulanate Diarrhea, Nausea And Vomiting   Other reaction(s): Other (See Comments) Abdomen Pain Abdomen pain   Statins    Other reaction(s): Other (See Comments) Myalgias and Myositis myalgias   Allopurinol Rash   Empagliflozin    Other reaction(s): Other (See Comments) Fainting, weakness, lightheaded, falling   Tiotropium    Other reaction(s): Other (See Comments) Urinary retention        Medication List     STOP taking these medications    lansoprazole 30 MG capsule Commonly known as: PREVACID       TAKE these medications    acetaminophen 500 MG tablet Commonly known as: TYLENOL Take 1,000 mg by mouth every 8 (eight) hours as needed for moderate pain.   Align 4 MG Caps Take 4 mg by mouth daily.   Alpha-Lipoic Acid 300 MG Tabs Take 600 mg by mouth daily.   aspirin 81 MG EC tablet Take 1 tablet (81 mg total) by mouth daily. Swallow whole.   benzonatate 200 MG capsule Commonly known as: TESSALON Take 200 mg by mouth every 8 (eight) hours as needed for cough.   bisacodyl 10 MG suppository Commonly known as: DULCOLAX Place 1 suppository (10 mg total) rectally daily as needed for up to 10 doses for moderate constipation.   Blink Tears 0.25 % Soln Generic drug: Polyethylene Glycol 400 Place 1 drop into both eyes daily as needed (dry eyes).   carvedilol 3.125 MG tablet Commonly known as: COREG Take 1 tablet (3.125  mg total) by mouth 2 (two) times daily with a meal. What changed:  medication strength how much to take Another medication with the same name was removed. Continue taking this medication, and follow the directions you see here.   Cholecalciferol 25 MCG (1000 UT) capsule Take 4,000 Units by mouth daily.   colchicine-probenecid 0.5-500 MG tablet Take 1 tablet by mouth daily.   cyanocobalamin 1000 MCG/ML injection Commonly known as: (VITAMIN B-12) Inject 1,000 mcg into the skin every 30 (thirty) days.   Eliquis 5 MG Tabs tablet Generic  drug: apixaban Take 1 tablet (5 mg total) by mouth 2 (two) times daily. Resume on 05/11/21 What changed: additional instructions   fenofibrate 160 MG tablet Take 160 mg by mouth daily.   fluticasone 50 MCG/ACT nasal spray Commonly known as: FLONASE Place 1 spray into both nostrils daily.   guaiFENesin 600 MG 12 hr tablet Commonly known as: MUCINEX Take 600 mg by mouth 2 (two) times daily as needed for cough.   latanoprost 0.005 % ophthalmic solution Commonly known as: XALATAN Place 1 drop into the left eye at bedtime.   losartan 25 MG tablet Commonly known as: COZAAR Take 1 tablet (25 mg total) by mouth daily.   magnesium oxide 400 MG tablet Commonly known as: MAG-OX Take 400 mg by mouth 2 (two) times daily.   meclizine 25 MG tablet Commonly known as: ANTIVERT Take 25 mg by mouth 3 (three) times daily as needed for nausea.   metoCLOPramide 5 MG tablet Commonly known as: REGLAN Take 2.5 mg by mouth at bedtime.   montelukast 10 MG tablet Commonly known as: SINGULAIR Take 10 mg by mouth at bedtime.   nitroGLYCERIN 0.4 MG SL tablet Commonly known as: NITROSTAT Place 0.4 mg under the tongue every 5 (five) minutes as needed for chest pain.   NovoLOG Mix 70/30 FlexPen (70-30) 100 UNIT/ML FlexPen Generic drug: insulin aspart protamine - aspart Inject 30 Units into the skin 2 (two) times daily with a meal. What changed:  how much to take additional instructions   ondansetron 4 MG disintegrating tablet Commonly known as: ZOFRAN-ODT Take 1 tablet (4 mg total) by mouth every 8 (eight) hours as needed for nausea or vomiting.   ondansetron 8 MG tablet Commonly known as: ZOFRAN Take 8 mg by mouth every 8 (eight) hours as needed for nausea or vomiting.   pantoprazole 40 MG tablet Commonly known as: PROTONIX Take 1 tablet (40 mg total) by mouth 2 (two) times daily.   polyethylene glycol 17 g packet Commonly known as: MIRALAX / GLYCOLAX Take 17 g by mouth daily.    ProAir RespiClick 962 (90 Base) MCG/ACT Aepb Generic drug: Albuterol Sulfate Inhale 2 puffs into the lungs every 6 (six) hours as needed (sob/wheezing).   ranolazine 500 MG 12 hr tablet Commonly known as: RANEXA Take 1 tablet (500 mg total) by mouth 2 (two) times daily.   Repatha SureClick 229 MG/ML Soaj Generic drug: Evolocumab Inject 1 mL into the skin every 14 (fourteen) days.   simvastatin 10 MG tablet Commonly known as: ZOCOR Take 1 tablet (10 mg total) by mouth daily at 6 PM.   sodium chloride 5 % ophthalmic ointment Commonly known as: MURO 798 Place 1 application into both eyes at bedtime.   sucralfate 1 GM/10ML suspension Commonly known as: CARAFATE Take 10 mLs (1 g total) by mouth 2 (two) times daily.   Vascepa 1 g capsule Generic drug: icosapent Ethyl Take 1 g by mouth 2 (two)  times daily.               Durable Medical Equipment  (From admission, onward)           Start     Ordered   09/05/21 1329  For home use only DME Other see comment  Once       Comments: Harrel Lemon Lift  Question:  Length of Need  Answer:  Lifetime   09/05/21 1329              Discharge Care Instructions  (From admission, onward)           Start     Ordered   09/05/21 0000  Discharge wound care:       Comments: Clean with saline, pack with moistured gauze, cover with foam dressing   09/05/21 1338            Follow-up Information     Cantwell, Celeste C, PA-C Follow up on 09/16/2021.   Specialty: Cardiology Why: Appointment at 2:30 PM.  Please arrive 15 minutes early and bring all medications with you. Contact information: [place] [address] [phone]         Mansouraty, Telford Nab., MD Follow up in 2 week(s).   Specialties: Gastroenterology, Internal Medicine Contact information: [address] [place] [address] [phone]         Burman Freestone, MD Follow up in 1 week(s).   Specialty: Internal  Medicine Contact information: [address] [place] [address] [phone]                32 minutes Signed: Sharen Hones 09/05/2021, 1:38 PM

## 2021-09-05 NOTE — Anesthesia Procedure Notes (Addendum)
Procedure Name: MAC Date/Time: 09/05/2021 9:04 AM Performed by: Inda Coke, CRNA Pre-anesthesia Checklist: Patient identified, Emergency Drugs available, Suction available, Timeout performed and Patient being monitored Patient Re-evaluated:Patient Re-evaluated prior to induction Oxygen Delivery Method: Nasal cannula Induction Type: IV induction Dental Injury: Teeth and Oropharynx as per pre-operative assessment

## 2021-09-06 DIAGNOSIS — I5032 Chronic diastolic (congestive) heart failure: Secondary | ICD-10-CM | POA: Diagnosis not present

## 2021-09-06 DIAGNOSIS — R1114 Bilious vomiting: Secondary | ICD-10-CM | POA: Diagnosis not present

## 2021-09-06 DIAGNOSIS — I214 Non-ST elevation (NSTEMI) myocardial infarction: Secondary | ICD-10-CM | POA: Diagnosis not present

## 2021-09-06 LAB — GLUCOSE, CAPILLARY
Glucose-Capillary: 134 mg/dL — ABNORMAL HIGH (ref 70–99)
Glucose-Capillary: 147 mg/dL — ABNORMAL HIGH (ref 70–99)
Glucose-Capillary: 152 mg/dL — ABNORMAL HIGH (ref 70–99)
Glucose-Capillary: 156 mg/dL — ABNORMAL HIGH (ref 70–99)
Glucose-Capillary: 166 mg/dL — ABNORMAL HIGH (ref 70–99)
Glucose-Capillary: 176 mg/dL — ABNORMAL HIGH (ref 70–99)
Glucose-Capillary: 190 mg/dL — ABNORMAL HIGH (ref 70–99)

## 2021-09-06 NOTE — Progress Notes (Signed)
Patient has home CPAP and is able to place it on himself. Patient and wife stated that they are waiting on [facility] for a hide home. Instructed family that if they needed help with CPAP to notify respiratory staff.

## 2021-09-06 NOTE — Social Work (Signed)
Pt medically ready for DC home. [facility] requested for transport.

## 2021-09-06 NOTE — Discharge Summary (Signed)
Physician Discharge Summary  Patient ID: Randy Liu MRN: [id number] DOB/AGE: 1951/09/02 70 y.o.  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  He was recently admitted at [facility], found to have elevated troponin at the time and underwent left heart cath which showed multivessel coronary artery disease for which she was recommended medical management.  He had multiple episodes of nausea, vomiting, chest pressure at home.  On presentation his troponins were gradually increasing so he was transferred to [facility] for further evaluation.  Cardiology were consulted and following.  Started  on heparin drip,now stopped.  Patient also endorsed chronic history of gastroparesis, upper abdominal discomfort requesting GI consultation.  EGD was performed, showed 2 nonbleeding ulcers, gastritis.  Patient be treated with sucralfate as well as PPI  twice a day.  Follow-up with GI as outpatient.   Non-STEMI with chest pain. Coronary artery disease. Patient has been evaluated by cardiology, patient has known coronary disease, planning for conservative treatment. Patient currently does not have any chest pain. 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 Gastric ulcer disease. He was treated with Reglan, PPI and sucralfate.  Patient is symptomatically improved.  Advance to full liquid diet today, at discharge, will place patient on soft diet and advance back to heart healthy diet when condition improves. Patient be followed by GI as outpatient.   Chronic diastolic congestive heart failure. Condition stable, no evidence of volume overload.  Type 2 diabetes. Continue current regimen.  Chronic kidney disease stage IIIa. Renal function still stable.  Morbid obesity. Patient has a BMI of 35.5 with multiple comorbidities.   History of PE/DVT. Continue Eliquis.    Patient was discharged yesterday, he did not go as he vomited 1 time.  He feels much better today, he was able to eat a soft diet without any nausea vomiting.  At this point, he is medically stable to be discharged.  Consults: GI  Significant Diagnostic Studies:  EGD Tortuous esophagus. Though overall normal mucosa was found in the entire esophagus - biopsied. - Z-line regular, 45 cm from the incisors. - 2 non-bleeding gastric ulcers with a clean ulcer base (Forrest Class III). Gastritis with some nodular erosions also present. Biopsied. - Normal pylorus. - A single duodenal polyp. Resected and retrieved. Clips (MR conditional) were placed. 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 - No other gross lesions in the duodenal bulb, in the first  portion of the duodenum and in the second portion of the duodenum. Biopsied. Impression: - The patient will be observed  post-procedure, until all discharge criteria are met. - Return patient to hospital ward for ongoing care. - Advance diet as tolerated. - We did today's procedure on anticoagulation having been received yesterday. Although would be ideal to hold anticoagulation for 24 hours, with his clinical case and underlying heart disease, I am not sure that we can do that (reason why we did procedure today on blood thinner having been received), so OK to continue anticoagulation this evening. - Recommend continuing PPI twice daily. - Add Carafate twice daily for 67-monthto aid in healing. - Continue Gastroparesis treatment with Reglan as currently in the hospital as outpatient. - Await pathology results. - Observe patient's clinical course. - [facility]. - If pathology shows evidence of duodenal adenoma then recommend within 1 year a follow up of the site, if the patient is clinically stable from a cardiovascular perspective   Treatments: EGD, symptomatic treatment  Discharge Exam: Blood pressure 118/62, pulse 65, temperature (!) 97.5 F (36.4 C), temperature source Oral, resp. rate 12, height 6' 1"  (1.854 m), weight 122.2 kg, SpO2 97 %. General appearance: alert and cooperative Resp: clear to auscultation bilaterally Cardio: regular rate and rhythm, S1, S2 normal, no murmur, click, rub or gallop GI: soft, non-tender; bowel sounds normal; no masses,  no organomegaly Extremities: extremities normal, atraumatic, no cyanosis or edema  Disposition: Discharge disposition: 01-Home or Self Care       Discharge Instructions     Diet general   Complete by: As directed    Soft die x 2 days then heart healthy dist   Discharge wound care:   Complete by: As directed    Clean with saline, pack with moistured gauze, cover with foam dressing   Discharge wound care:   Complete by: As directed    As ordered previously    Increase activity slowly   Complete by: As directed       Allergies as of 09/06/2021       Reactions   Metoclopramide Itching   Loss of Balance; Urinary Incontinence   Nsaids    Other reaction(s): Other (See Comments) Renal Insufficiency Kidney issues   Amoxicillin-pot Clavulanate Diarrhea, Nausea And Vomiting   Other reaction(s): Other (See Comments) Abdomen Pain Abdomen pain   Statins    Other reaction(s): Other (See Comments) Myalgias and Myositis myalgias   Allopurinol Rash   Empagliflozin    Other reaction(s): Other (See Comments) Fainting, weakness, lightheaded, falling   Tiotropium    Other reaction(s): Other (See Comments) Urinary retention        Medication List     STOP taking these medications    lansoprazole 30 MG capsule Commonly known as: PREVACID       TAKE these medications    acetaminophen 500 MG tablet Commonly known as: TYLENOL Take 1,000 mg by mouth every 8 (eight) hours as needed for moderate pain.   Align 4 MG Caps Take 4 mg by mouth daily.   Alpha-Lipoic Acid 300 MG Tabs Take 600 mg by mouth daily.   aspirin 81 MG EC tablet Take 1 tablet (81 mg total) by mouth daily. Swallow whole.   benzonatate 200 MG capsule Commonly known as: TESSALON Take 200 mg by mouth every 8 (eight) hours as needed for cough.   bisacodyl 10 MG suppository Commonly  known as: DULCOLAX Place 1 suppository (10 mg total) rectally daily as needed for up to 10 doses for moderate constipation.   Blink Tears 0.25 % Soln Generic drug: Polyethylene Glycol 400 Place 1 drop into both eyes daily as needed (dry eyes).   carvedilol 3.125 MG tablet Commonly known as: COREG Take 1 tablet (3.125 mg total) by mouth 2 (two) times daily with a meal. What changed:  medication strength how much to take Another medication with the same name was removed. Continue taking this medication, and follow the directions you see here.   Cholecalciferol 25 MCG (1000 UT)  capsule Take 4,000 Units by mouth daily.   colchicine-probenecid 0.5-500 MG tablet Take 1 tablet by mouth daily.   cyanocobalamin 1000 MCG/ML injection Commonly known as: (VITAMIN B-12) Inject 1,000 mcg into the skin every 30 (thirty) days.   Eliquis 5 MG Tabs tablet Generic drug: apixaban Take 1 tablet (5 mg total) by mouth 2 (two) times daily. Resume on 05/11/21 What changed: additional instructions   fenofibrate 160 MG tablet Take 160 mg by mouth daily.   fluticasone 50 MCG/ACT nasal spray Commonly known as: FLONASE Place 1 spray into both nostrils daily.   guaiFENesin 600 MG 12 hr tablet Commonly known as: MUCINEX Take 600 mg by mouth 2 (two) times daily as needed for cough.   latanoprost 0.005 % ophthalmic solution Commonly known as: XALATAN Place 1 drop into the left eye at bedtime.   losartan 25 MG tablet Commonly known as: COZAAR Take 1 tablet (25 mg total) by mouth daily.   magnesium oxide 400 MG tablet Commonly known as: MAG-OX Take 400 mg by mouth 2 (two) times daily.   meclizine 25 MG tablet Commonly known as: ANTIVERT Take 25 mg by mouth 3 (three) times daily as needed for nausea.   metoCLOPramide 5 MG tablet Commonly known as: REGLAN Take 2.5 mg by mouth at bedtime.   montelukast 10 MG tablet Commonly known as: SINGULAIR Take 10 mg by mouth at bedtime.   nitroGLYCERIN 0.4 MG SL tablet Commonly known as: NITROSTAT Place 0.4 mg under the tongue every 5 (five) minutes as needed for chest pain.   NovoLOG Mix 70/30 FlexPen (70-30) 100 UNIT/ML FlexPen Generic drug: insulin aspart protamine - aspart Inject 30 Units into the skin 2 (two) times daily with a meal. What changed:  how much to take additional instructions   ondansetron 4 MG disintegrating tablet Commonly known as: ZOFRAN-ODT Take 1 tablet (4 mg total) by mouth every 8 (eight) hours as needed for nausea or vomiting.   ondansetron 8 MG tablet Commonly known as: ZOFRAN Take 8 mg by  mouth every 8 (eight) hours as needed for nausea or vomiting.   pantoprazole 40 MG tablet Commonly known as: PROTONIX Take 1 tablet (40 mg total) by mouth 2 (two) times daily.   polyethylene glycol 17 g packet Commonly known as: MIRALAX / GLYCOLAX Take 17 g by mouth daily.   ProAir RespiClick 409 (90 Base) MCG/ACT Aepb Generic drug: Albuterol Sulfate Inhale 2 puffs into the lungs every 6 (six) hours as needed (sob/wheezing).   ranolazine 500 MG 12 hr tablet Commonly known as: RANEXA Take 1 tablet (500 mg total) by mouth 2 (two) times daily.   Repatha SureClick 811 MG/ML Soaj Generic drug: Evolocumab Inject 1 mL into the skin every 14 (fourteen) days.   simvastatin 10 MG tablet Commonly known as: ZOCOR Take 1 tablet (10 mg total) by mouth daily at 6 PM.  sodium chloride 5 % ophthalmic ointment Commonly known as: MURO 767 Place 1 application into both eyes at bedtime.   sucralfate 1 GM/10ML suspension Commonly known as: CARAFATE Take 10 mLs (1 g total) by mouth 2 (two) times daily.   Vascepa 1 g capsule Generic drug: icosapent Ethyl Take 1 g by mouth 2 (two) times daily.               Durable Medical Equipment  (From admission, onward)           Start     Ordered   09/05/21 1329  For home use only DME Other see comment  Once       Comments: Harrel Lemon Lift  Question:  Length of Need  Answer:  Lifetime   09/05/21 1329              Discharge Care Instructions  (From admission, onward)           Start     Ordered   09/06/21 0000  Discharge wound care:       Comments: As ordered previously   09/06/21 1350   09/05/21 0000  Discharge wound care:       Comments: Clean with saline, pack with moistured gauze, cover with foam dressing   09/05/21 1338            Follow-up Information     Cantwell, Celeste C, PA-C Follow up on 09/16/2021.   Specialty: Cardiology Why: Appointment at 2:30 PM.  Please arrive 15 minutes early and bring all medications  with you. Contact information: [place] [address] [phone]         Mansouraty, Telford Nab., MD Follow up in 2 week(s).   Specialties: Gastroenterology, Internal Medicine Contact information: [address] [place] [address] [phone]         Burman Freestone, MD Follow up in 1 week(s).   Specialty: Internal Medicine Contact information: [address] [place] [address] [facility] Follow up.   Why: Hoyer Lift to be delivered to the home- Contact information: [place] [address] [place] Follow up.   Specialty: Home Health Services Why: Physical Therapy and Occupational Therapy and Registered Nurse. Contact information: [place] [address] [phone]                 Signed: Sharen Hones 09/06/2021, 1:51 PM

## 2021-09-07 DIAGNOSIS — R1114 Bilious vomiting: Secondary | ICD-10-CM | POA: Diagnosis not present

## 2021-09-07 DIAGNOSIS — I214 Non-ST elevation (NSTEMI) myocardial infarction: Secondary | ICD-10-CM | POA: Diagnosis not present

## 2021-09-07 DIAGNOSIS — I5032 Chronic diastolic (congestive) heart failure: Secondary | ICD-10-CM | POA: Diagnosis not present

## 2021-09-07 LAB — GLUCOSE, CAPILLARY
Glucose-Capillary: 120 mg/dL — ABNORMAL HIGH (ref 70–99)
Glucose-Capillary: 123 mg/dL — ABNORMAL HIGH (ref 70–99)
Glucose-Capillary: 137 mg/dL — ABNORMAL HIGH (ref 70–99)
Glucose-Capillary: 164 mg/dL — ABNORMAL HIGH (ref 70–99)

## 2021-09-07 MED ORDER — LACTULOSE 10 GM/15ML PO SOLN
20.0000 g | Freq: Once | ORAL | Status: AC
  Administered 2021-09-07: 10:00:00 20 g via ORAL
  Filled 2021-09-07: qty 30

## 2021-09-07 MED ORDER — LACTATED RINGERS IV SOLN: INTRAVENOUS | Status: DC

## 2021-09-07 NOTE — Progress Notes (Signed)
Patient tried to have BM on bedpan after lactulose, he did not make any stool. He did have large amounts of gas. [facility] arrived prior to getting tap water enema. Patient and wife due to waiting 20~ hours for transport did not want to cancel transport and reschedule to wait to get the enema and have BM prior to leaving hospital. I told them they could get an enema OTC and give it to him at home. This is what she plans on doing. Patient VS were stable, assisted [facility] with transfer and patient left the unit.

## 2021-09-07 NOTE — Progress Notes (Signed)
Spoke to MD about patient having no gas/BM in a week with last BM being very small compared to normal per wife, who told me it was been 2 weeks since he has actually had a normal BM. Bowel sounds faint/hypoactive on the left quadrants, patient still having nausea with intake. MD ordered Lactulose once time does which was given, and discussed ordering abd x-ray to check for obstruction, but was not ordered due to previoue abd CT being negative on 12/18.  Patient stable, nausea improved with Zofran and ginger ale, will continue to monitor. Awaiting [facility] for d/c home with [facility].

## 2021-09-07 NOTE — Social Work (Signed)
°  CSW noticed that pt was still at hospital. CSW spoke with charge RN Debbe Bales and she stated that she had [facility] and they stated that they are backed up and pt is still on list. Pt is just wanting on transport.

## 2021-09-07 NOTE — Discharge Summary (Signed)
Physician Discharge Summary  Patient ID: Randy Liu MRN: [id number] DOB/AGE: Jul 17, 1951 70 y.o.  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 Gastric ulcer disease. Morbid obesity. History of PE/DVT.  Discharged Condition: good  Hospital Course:  Please see yesterday's discharge summary for hospital course. Patient was discharged yesterday, was not transported due to transportation not available.  Patient is still medically stable today.  Consults: None  Significant Diagnostic Studies:   Treatments:   Discharge Exam: Blood pressure 116/78, pulse 79, temperature 98.3 F (36.8 C), temperature source Oral, resp. rate 18, height 6\' 1"  (1.854 m), weight 122.2 kg, SpO2 96 %. General appearance: alert and cooperative Resp: clear to auscultation bilaterally Cardio: regular rate and rhythm, S1, S2 normal, no murmur, click, rub or gallop GI: soft, non-tender; bowel sounds normal; no masses,  no organomegaly Extremities: extremities normal, atraumatic, no cyanosis or edema  Disposition: Discharge disposition: 01-Home or Self  Care       Discharge Instructions     Diet general   Complete by: As directed    Soft die x 2 days then heart healthy dist   Discharge wound care:   Complete by: As directed    Clean with saline, pack with moistured gauze, cover with foam dressing   Discharge wound care:   Complete by: As directed    As ordered previously   Increase activity slowly   Complete by: As directed       Allergies as of 09/07/2021       Reactions   Metoclopramide Itching   Loss of Balance; Urinary Incontinence   Nsaids    Other reaction(s): Other (See Comments) Renal Insufficiency Kidney issues   Amoxicillin-pot Clavulanate Diarrhea, Nausea And Vomiting   Other reaction(s): Other (See Comments) Abdomen Pain Abdomen pain   Statins    Other reaction(s): Other (See Comments) Myalgias and Myositis myalgias   Allopurinol Rash   Empagliflozin    Other reaction(s): Other (See Comments) Fainting, weakness, lightheaded, falling   Tiotropium    Other reaction(s): Other (See Comments) Urinary retention        Medication List     STOP taking these medications    lansoprazole 30 MG capsule Commonly known as: PREVACID       TAKE these medications    acetaminophen 500 MG tablet Commonly known as: TYLENOL Take 1,000 mg by mouth every 8 (eight) hours as needed for moderate pain.   Align 4 MG Caps Take 4 mg by mouth daily.   Alpha-Lipoic Acid 300 MG Tabs Take 600 mg by mouth daily.   aspirin 81 MG EC tablet Take 1 tablet (81 mg total) by mouth daily. Swallow whole.  benzonatate 200 MG capsule Commonly known as: TESSALON Take 200 mg by mouth every 8 (eight) hours as needed for cough.   bisacodyl 10 MG suppository Commonly known as: DULCOLAX Place 1 suppository (10 mg total) rectally daily as needed for up to 10 doses for moderate constipation.   Blink Tears 0.25 % Soln Generic drug: Polyethylene Glycol 400 Place 1 drop into both eyes daily as needed (dry eyes).    carvedilol 3.125 MG tablet Commonly known as: COREG Take 1 tablet (3.125 mg total) by mouth 2 (two) times daily with a meal. What changed:  medication strength how much to take Another medication with the same name was removed. Continue taking this medication, and follow the directions you see here.   Cholecalciferol 25 MCG (1000 UT) capsule Take 4,000 Units by mouth daily.   colchicine-probenecid 0.5-500 MG tablet Take 1 tablet by mouth daily.   cyanocobalamin 1000 MCG/ML injection Commonly known as: (VITAMIN B-12) Inject 1,000 mcg into the skin every 30 (thirty) days.   Eliquis 5 MG Tabs tablet Generic drug: apixaban Take 1 tablet (5 mg total) by mouth 2 (two) times daily. Resume on 05/11/21 What changed: additional instructions   fenofibrate 160 MG tablet Take 160 mg by mouth daily.   fluticasone 50 MCG/ACT nasal spray Commonly known as: FLONASE Place 1 spray into both nostrils daily.   guaiFENesin 600 MG 12 hr tablet Commonly known as: MUCINEX Take 600 mg by mouth 2 (two) times daily as needed for cough.   latanoprost 0.005 % ophthalmic solution Commonly known as: XALATAN Place 1 drop into the left eye at bedtime.   losartan 25 MG tablet Commonly known as: COZAAR Take 1 tablet (25 mg total) by mouth daily.   magnesium oxide 400 MG tablet Commonly known as: MAG-OX Take 400 mg by mouth 2 (two) times daily.   meclizine 25 MG tablet Commonly known as: ANTIVERT Take 25 mg by mouth 3 (three) times daily as needed for nausea.   metoCLOPramide 5 MG tablet Commonly known as: REGLAN Take 2.5 mg by mouth at bedtime.   montelukast 10 MG tablet Commonly known as: SINGULAIR Take 10 mg by mouth at bedtime.   nitroGLYCERIN 0.4 MG SL tablet Commonly known as: NITROSTAT Place 0.4 mg under the tongue every 5 (five) minutes as needed for chest pain.   NovoLOG Mix 70/30 FlexPen (70-30) 100 UNIT/ML FlexPen Generic drug: insulin aspart protamine - aspart Inject 30 Units  into the skin 2 (two) times daily with a meal. What changed:  how much to take additional instructions   ondansetron 4 MG disintegrating tablet Commonly known as: ZOFRAN-ODT Take 1 tablet (4 mg total) by mouth every 8 (eight) hours as needed for nausea or vomiting.   ondansetron 8 MG tablet Commonly known as: ZOFRAN Take 8 mg by mouth every 8 (eight) hours as needed for nausea or vomiting.   pantoprazole 40 MG tablet Commonly known as: PROTONIX Take 1 tablet (40 mg total) by mouth 2 (two) times daily.   polyethylene glycol 17 g packet Commonly known as: MIRALAX / GLYCOLAX Take 17 g by mouth daily.   ProAir RespiClick [id number] (90 Base) MCG/ACT Aepb Generic drug: Albuterol Sulfate Inhale 2 puffs into the lungs every 6 (six) hours as needed (sob/wheezing).   ranolazine 500 MG 12 hr tablet Commonly known as: RANEXA Take 1 tablet (500 mg total) by mouth 2 (two) times daily.   Repatha SureClick XX123456 MG/ML Soaj Generic drug: Evolocumab Inject 1 mL into the skin  every 14 (fourteen) days.   simvastatin 10 MG tablet Commonly known as: ZOCOR Take 1 tablet (10 mg total) by mouth daily at 6 PM.   sodium chloride 5 % ophthalmic ointment Commonly known as: MURO 0000000 Place 1 application into both eyes at bedtime.   sucralfate 1 GM/10ML suspension Commonly known as: CARAFATE Take 10 mLs (1 g total) by mouth 2 (two) times daily.   Vascepa 1 g capsule Generic drug: icosapent Ethyl Take 1 g by mouth 2 (two) times daily.               Durable Medical Equipment  (From admission, onward)           Start     Ordered   09/05/21 1329  For home use only DME Other see comment  Once       Comments: Harrel Lemon Lift  Question:  Length of Need  Answer:  Lifetime   09/05/21 1329              Discharge Care Instructions  (From admission, onward)           Start     Ordered   09/06/21 0000  Discharge wound care:       Comments: As ordered previously   09/06/21 1350    09/05/21 0000  Discharge wound care:       Comments: Clean with saline, pack with moistured gauze, cover with foam dressing   09/05/21 1338            Follow-up Information     Cantwell, Celeste C, PA-C Follow up on 09/16/2021.   Specialty: Cardiology Why: Appointment at 2:30 PM.  Please arrive 15 minutes early and bring all medications with you. Contact information: [place] [address] [phone]         Mansouraty, Telford Nab., MD Follow up in 2 week(s).   Specialties: Gastroenterology, Internal Medicine Contact information: [address]  [place] [address] [phone]         Burman Freestone, MD Follow up in 1 week(s).   Specialty: Internal Medicine Contact information: [id number] [place] [address] [facility] Follow up.   Why: Hoyer Lift to be delivered to the home- Contact information: [place] [address] [place] Follow up.   Specialty: Home Health Services Why: Physical Therapy and Occupational Therapy and Registered Nurse. Contact information: [place] [address] [phone]                 Signed: Sharen Hones 09/07/2021, 1:43 PM

## 2021-09-07 NOTE — Progress Notes (Signed)
Notified Angie with [facility] that pt has been D/C.

## 2021-09-08 ENCOUNTER — Encounter (HOSPITAL_COMMUNITY): Payer: Self-pay | Admitting: Gastroenterology

## 2021-09-09 ENCOUNTER — Encounter: Payer: Self-pay | Admitting: Gastroenterology

## 2021-09-09 LAB — SURGICAL PATHOLOGY

## 2021-09-16 ENCOUNTER — Other Ambulatory Visit: Payer: Self-pay

## 2021-09-16 ENCOUNTER — Encounter: Payer: Self-pay | Admitting: Student

## 2021-09-16 ENCOUNTER — Ambulatory Visit: Payer: Medicare Other | Admitting: Student

## 2021-09-16 VITALS — BP 118/68 | HR 97 | Resp 16 | Ht 73.0 in | Wt 261.0 lb

## 2021-09-16 DIAGNOSIS — I2510 Atherosclerotic heart disease of native coronary artery without angina pectoris: Secondary | ICD-10-CM

## 2021-09-16 DIAGNOSIS — I252 Old myocardial infarction: Secondary | ICD-10-CM

## 2021-09-16 DIAGNOSIS — E7800 Pure hypercholesterolemia, unspecified: Secondary | ICD-10-CM

## 2021-09-16 DIAGNOSIS — I10 Essential (primary) hypertension: Secondary | ICD-10-CM

## 2021-09-16 NOTE — Progress Notes (Signed)
I connected with the patient on 09/16/21 by a telephone call and verified that I am speaking with the correct person using two identifiers.     I offered the patient a video enabled application for a virtual visit. Unfortunately, this could not be accomplished due to technical difficulties/lack of video enabled phone/computer. I discussed the limitations of evaluation and management by telemedicine and the availability of in person appointments. The patient expressed understanding and agreed to proceed.   This visit type was conducted due to national recommendations for restrictions regarding the COVID-19 Pandemic (e.g. social distancing).  This format is felt to be most appropriate for this patient at this time.  All issues noted in this document were discussed and addressed.  No physical exam was performed (except for noted visual exam findings with Tele health visits).  The patient has consented to conduct a Tele health visit and understands insurance will be billed.   Primary Physician/Referring:  Burman Freestone, MD  Patient ID: Randy Liu, male    DOB: 1951-03-17, 71 y.o.   MRN: [id number]  No chief complaint on file.   HPI:    Randy Liu  is a 71 y.o. Insulin-dependent diabetes mellitus type 2, chronic HFpEF, hyperlipidemia (previous statin intolerance), hypertriglyceridemia, GERD, history of PE on oral anticoagulation, predominantly bedbound due to peripheral neuropathy due to diabetes and COVID-19 infection, obesity due to excess calories.  Patient admitted 08/09/2021 - 08/13/2021 when he presented with nausea and vomiting secondary to partial small bowel obstruction.  During hospitalization patient developed chest pain and troponin became elevated, he therefore underwent cardiac catheterization 08/12/2021 for NSTEMI.  Patient was found to have multivessel disease, shared decision at the time between interventional cardiology and the patient's wife was that patient was  not a candidate for CABG given poor functional status and multiple medical comorbidities, opted for medical management.   Patient was again admitted with NSTEMI and nausea/vomiting 08/31/2021 - 09/07/2021.  Patient's symptoms improved with up titration of antianginal therapy and treatment for gastric ulcer.  Patient states he has been doing well at home since discharge.  He remains fatigued, however this is improving and he has had no recurrence of chest pain, nausea, or vomiting.  Blood pressure is well controlled.  Denies chest pain, palpitations, dizziness, lightheadedness, syncope, near syncope.  Denies orthopnea, PND.  He has returned to doing in-home physical therapy without issue.  Patient is tolerating current medications without issue.  Past Medical History:  Diagnosis Date   Asthma    CHF (congestive heart failure) ([place])    Diabetes mellitus without complication ([place])    Hypertension    Sleep apnea    Past Surgical History:  Procedure Laterality Date   BIOPSY  12/14/2020   Procedure: BIOPSY;  Surgeon: Jackquline Denmark, MD;  Location: [facility];  Service: Endoscopy;;   BIOPSY  09/05/2021   Procedure: BIOPSY;  Surgeon: Irving Copas., MD;  Location: [place];  Service: Gastroenterology;;   CHOLECYSTECTOMY N/A 12/17/2020   Procedure: LAPAROSCOPIC CHOLECYSTECTOMY, LIVER BIOPSY, PRIMARY UMBILICAL HERNIA REPAIR;  Surgeon: Armandina Gemma, MD;  Location: [facility];  Service: General;  Laterality: N/A;   COLONOSCOPY WITH PROPOFOL N/A 05/08/2021   Procedure: COLONOSCOPY WITH PROPOFOL;  Surgeon: Carol Ada, MD;  Location: [facility];  Service: Endoscopy;  Laterality: N/A;   ENDOSCOPIC RETROGRADE CHOLANGIOPANCREATOGRAPHY (ERCP) WITH PROPOFOL N/A 12/14/2020   Procedure: ENDOSCOPIC RETROGRADE CHOLANGIOPANCREATOGRAPHY (ERCP) WITH PROPOFOL;  Surgeon: Jackquline Denmark, MD;  [facility];  Service: Endoscopy;  Laterality: N/A;  ESOPHAGOGASTRODUODENOSCOPY N/A 09/05/2021   Procedure:  ESOPHAGOGASTRODUODENOSCOPY (EGD);  Surgeon: Irving Copas., MD;  Location: [place];  Service: Gastroenterology;  Laterality: N/A;   HEMOSTASIS CLIP PLACEMENT  09/05/2021   Procedure: HEMOSTASIS CLIP PLACEMENT;  Surgeon: Irving Copas., MD;  Location: [place];  Service: Gastroenterology;;   LEFT HEART CATH AND CORONARY ANGIOGRAPHY N/A 08/12/2021   Procedure: LEFT HEART CATH AND CORONARY ANGIOGRAPHY;  Surgeon: Adrian Prows, MD;  Location: [place] [facility];  Service: Cardiovascular;  Laterality: N/A;   POLYPECTOMY  05/08/2021   Procedure: POLYPECTOMY;  Surgeon: Carol Ada, MD;  Location: [facility];  Service: Endoscopy;;   POLYPECTOMY  09/05/2021   Procedure: POLYPECTOMY;  Surgeon: Irving Copas., MD;  Location: [place];  Service: Gastroenterology;;   REMOVAL OF STONES  12/14/2020   Procedure: REMOVAL OF STONES;  Surgeon: Jackquline Denmark, MD;  [facility];  Service: Endoscopy;;   SPHINCTEROTOMY  12/14/2020   Procedure: Joan Mayans;  Surgeon: Jackquline Denmark, MD;  [facility];  Service: Endoscopy;;   Family History  Problem Relation Age of Onset   Heart failure Mother    Heart disease Mother    Cancer Father     Social History   Tobacco Use   Smoking status: Former   Smokeless tobacco: Never  Substance Use Topics   Alcohol use: Not Currently   Marital Status: Married   ROS  Review of Systems  Constitutional: Negative for malaise/fatigue and weight gain.  Cardiovascular:  Positive for dyspnea on exertion (chronic, stable) and leg swelling (chronic, stable). Negative for chest pain (no recurrence since discharge), claudication, near-syncope, orthopnea, palpitations, paroxysmal nocturnal dyspnea and syncope.  Neurological:  Negative for dizziness.   Objective  Vitals are obtained using patient's home monitoring devices and reported to our office given nature of today's telehealth visit  Blood pressure 118/68, pulse 97,  resp. rate 16, height 6\' 1"  (1.854 m), weight 261 lb (118.4 kg), SpO2 96 %.  Vitals with BMI 09/16/2021 09/07/2021 09/07/2021  Height 6\' 1"  - -  Weight 261 lbs - -  BMI [id number] - -  Systolic 123456 [id number] -  Diastolic 68 78 -  Pulse 97 - 79    Unable to perform physical exam due to telehealth nature of today's visit.  Below was physical exam from last outpatient office visit for reference:  Physical Exam Vitals reviewed.  Constitutional:      Appearance: He is obese.     Comments: Bedbound, presents in stretcher.  Neck:     Vascular: No carotid bruit or JVD.  Cardiovascular:     Rate and Rhythm: Normal rate and regular rhythm.     Pulses: Intact distal pulses.     Heart sounds: S1 normal and S2 normal. No murmur heard.   No gallop.  Pulmonary:     Effort: Pulmonary effort is normal. No respiratory distress.     Breath sounds: No wheezing, rhonchi or rales.  Musculoskeletal:     Right lower leg: Edema (trace pitting) present.     Left lower leg: Edema (rrace pitting) present.  Skin:    General: Skin is warm and dry.  Neurological:     Mental Status: He is alert.   Laboratory examination:   Recent Labs    08/31/21 1325 09/01/21 0224 09/04/21 0359  NA 138 136 132*  K 5.0 4.6 4.4  CL 103 102 99  CO2 26 26 23   GLUCOSE 217* 169* 138*  BUN 35* 28* 23  CREATININE 1.62*  1.58* 1.44*  CALCIUM 9.6 9.4 9.4  GFRNONAA 45* 46* 52*   estimated creatinine clearance is 62.5 mL/min (A) (by C-G formula based on SCr of 1.44 mg/dL (H)).  CMP Latest Ref Rng & Units 09/04/2021 09/01/2021 08/31/2021  Glucose 70 - 99 mg/dL 138(H) 169(H) 217(H)  BUN 8 - 23 mg/dL 23 28(H) 35(H)  Creatinine 0.61 - 1.24 mg/dL 1.44(H) 1.58(H) 1.62(H)  Sodium 135 - 145 mmol/L 132(L) 136 138  Potassium 3.5 - 5.1 mmol/L 4.4 4.6 5.0  Chloride 98 - 111 mmol/L 99 102 103  CO2 22 - 32 mmol/L 23 26 26   Calcium 8.9 - 10.3 mg/dL 9.4 9.4 9.6  Total Protein 6.5 - 8.1 g/dL - - 7.3  Total Bilirubin 0.3 - 1.2 mg/dL - - 0.5   Alkaline Phos 38 - 126 U/L - - 40  AST 15 - 41 U/L - - 36  ALT 0 - 44 U/L - - 33   CBC Latest Ref Rng & Units 09/04/2021 09/01/2021 08/31/2021  WBC 4.0 - 10.5 K/uL 9.0 8.3 6.2  Hemoglobin 13.0 - 17.0 g/dL 13.5 12.6(L) 13.4  Hematocrit 39.0 - 52.0 % 40.7 39.5 41.2  Platelets 150 - 400 K/uL 235 221 217    Lipid Panel Recent Labs    08/10/21 0204  CHOL 223*  TRIG 313*  LDLCALC 136*  VLDL 63*  HDL 24*  CHOLHDL 9.3    HEMOGLOBIN A1C Lab Results  Component Value Date   HGBA1C 8.0 (H) 08/09/2021   MPG 183 08/09/2021   TSH No results for input(s): TSH in the last 8760 hours.  External labs:  08/19/2021: Sodium 138, potassium 4.8, BUN 27, creatinine 1.13, GFR >60 Hgb 12.1, HCT 36.7, MCV 92.0, platelet 213  Allergies   Allergies  Allergen Reactions   Metoclopramide Itching    Loss of Balance; Urinary Incontinence   Nsaids     Other reaction(s): Other (See Comments) Renal Insufficiency Kidney issues    Amoxicillin-Pot Clavulanate Diarrhea and Nausea And Vomiting    Other reaction(s): Other (See Comments) Abdomen Pain Abdomen pain    Statins     Other reaction(s): Other (See Comments) Myalgias and Myositis myalgias    Allopurinol Rash   Empagliflozin     Other reaction(s): Other (See Comments) Fainting, weakness, lightheaded, falling   Tiotropium     Other reaction(s): Other (See Comments) Urinary retention    Medications Prior to Visit:   Outpatient Medications Prior to Visit  Medication Sig Dispense Refill   acetaminophen (TYLENOL) 500 MG tablet Take 1,000 mg by mouth every 8 (eight) hours as needed for moderate pain.     Alpha-Lipoic Acid 300 MG TABS Take 600 mg by mouth daily.     benzonatate (TESSALON) 200 MG capsule Take 200 mg by mouth every 8 (eight) hours as needed for cough.     bisacodyl (DULCOLAX) 10 MG suppository Place 1 suppository (10 mg total) rectally daily as needed for up to 10 doses for moderate constipation. 10 suppository 0    carvedilol (COREG) 3.125 MG tablet Take 1 tablet (3.125 mg total) by mouth 2 (two) times daily with a meal. 60 tablet 0   Cholecalciferol 25 MCG (1000 UT) capsule Take 4,000 Units by mouth daily.     colchicine-probenecid 0.5-500 MG tablet Take 1 tablet by mouth daily.     cyanocobalamin (,VITAMIN B-12,) 1000 MCG/ML injection Inject 1,000 mcg into the skin every 30 (thirty) days.     ELIQUIS 5 MG TABS tablet Take 1 tablet (  5 mg total) by mouth 2 (two) times daily. Resume on 05/11/21 (Patient taking differently: Take 5 mg by mouth 2 (two) times daily.) 60 tablet    Evolocumab (REPATHA SURECLICK) XX123456 MG/ML SOAJ Inject 1 mL into the skin every 14 (fourteen) days. 2 mL 3   fenofibrate 160 MG tablet Take 160 mg by mouth daily.     fluticasone (FLONASE) 50 MCG/ACT nasal spray Place 1 spray into both nostrils daily.     guaiFENesin (MUCINEX) 600 MG 12 hr tablet Take 600 mg by mouth 2 (two) times daily as needed for cough.     latanoprost (XALATAN) 0.005 % ophthalmic solution Place 1 drop into the left eye at bedtime.     losartan (COZAAR) 25 MG tablet Take 1 tablet (25 mg total) by mouth daily. 30 tablet 3   magnesium oxide (MAG-OX) 400 MG tablet Take 400 mg by mouth 2 (two) times daily.     meclizine (ANTIVERT) 25 MG tablet Take 25 mg by mouth 3 (three) times daily as needed for nausea.     metoCLOPramide (REGLAN) 5 MG tablet Take 2.5 mg by mouth at bedtime.     montelukast (SINGULAIR) 10 MG tablet Take 10 mg by mouth at bedtime.     nitroGLYCERIN (NITROSTAT) 0.4 MG SL tablet Place 0.4 mg under the tongue every 5 (five) minutes as needed for chest pain.     NOVOLOG MIX 70/30 FLEXPEN (70-30) 100 UNIT/ML FlexPen Inject 30 Units into the skin 2 (two) times daily with a meal. (Patient taking differently: Inject 40-85 Units into the skin 2 (two) times daily with a meal. Sliding scale) 15 mL 11   ondansetron (ZOFRAN) 8 MG tablet Take 8 mg by mouth every 8 (eight) hours as needed for nausea or vomiting.      ondansetron (ZOFRAN-ODT) 4 MG disintegrating tablet Take 1 tablet (4 mg total) by mouth every 8 (eight) hours as needed for nausea or vomiting. 20 tablet 0   pantoprazole (PROTONIX) 40 MG tablet Take 1 tablet (40 mg total) by mouth 2 (two) times daily. 60 tablet 0   polyethylene glycol (MIRALAX / GLYCOLAX) 17 g packet Take 17 g by mouth daily. 14 each 0   Polyethylene Glycol 400 (BLINK TEARS) 0.25 % SOLN Place 1 drop into both eyes daily as needed (dry eyes).     PROAIR RESPICLICK 123XX123 (90 Base) MCG/ACT AEPB Inhale 2 puffs into the lungs every 6 (six) hours as needed (sob/wheezing).     Probiotic Product (ALIGN) 4 MG CAPS Take 4 mg by mouth daily.     ranolazine (RANEXA) 500 MG 12 hr tablet Take 1 tablet (500 mg total) by mouth 2 (two) times daily. 60 tablet 0   simvastatin (ZOCOR) 10 MG tablet Take 1 tablet (10 mg total) by mouth daily at 6 PM. 30 tablet 0   sodium chloride (MURO 128) 5 % ophthalmic ointment Place 1 application into both eyes at bedtime.     sucralfate (CARAFATE) 1 GM/10ML suspension Take 10 mLs (1 g total) by mouth 2 (two) times daily. 420 mL 0   VASCEPA 1 g capsule Take 1 g by mouth 2 (two) times daily.     No facility-administered medications prior to visit.   Final Medications at End of Visit    Current Meds  Medication Sig   acetaminophen (TYLENOL) 500 MG tablet Take 1,000 mg by mouth every 8 (eight) hours as needed for moderate pain.   Alpha-Lipoic Acid 300 MG TABS Take 600  mg by mouth daily.   benzonatate (TESSALON) 200 MG capsule Take 200 mg by mouth every 8 (eight) hours as needed for cough.   bisacodyl (DULCOLAX) 10 MG suppository Place 1 suppository (10 mg total) rectally daily as needed for up to 10 doses for moderate constipation.   carvedilol (COREG) 3.125 MG tablet Take 1 tablet (3.125 mg total) by mouth 2 (two) times daily with a meal.   Cholecalciferol 25 MCG (1000 UT) capsule Take 4,000 Units by mouth daily.   colchicine-probenecid 0.5-500 MG tablet Take 1  tablet by mouth daily.   cyanocobalamin (,VITAMIN B-12,) 1000 MCG/ML injection Inject 1,000 mcg into the skin every 30 (thirty) days.   ELIQUIS 5 MG TABS tablet Take 1 tablet (5 mg total) by mouth 2 (two) times daily. Resume on 05/11/21 (Patient taking differently: Take 5 mg by mouth 2 (two) times daily.)   Evolocumab (REPATHA SURECLICK) [id number] MG/ML SOAJ Inject 1 mL into the skin every 14 (fourteen) days.   fenofibrate 160 MG tablet Take 160 mg by mouth daily.   fluticasone (FLONASE) 50 MCG/ACT nasal spray Place 1 spray into both nostrils daily.   guaiFENesin (MUCINEX) 600 MG 12 hr tablet Take 600 mg by mouth 2 (two) times daily as needed for cough.   latanoprost (XALATAN) 0.005 % ophthalmic solution Place 1 drop into the left eye at bedtime.   losartan (COZAAR) 25 MG tablet Take 1 tablet (25 mg total) by mouth daily.   magnesium oxide (MAG-OX) 400 MG tablet Take 400 mg by mouth 2 (two) times daily.   meclizine (ANTIVERT) 25 MG tablet Take 25 mg by mouth 3 (three) times daily as needed for nausea.   metoCLOPramide (REGLAN) 5 MG tablet Take 2.5 mg by mouth at bedtime.   montelukast (SINGULAIR) 10 MG tablet Take 10 mg by mouth at bedtime.   nitroGLYCERIN (NITROSTAT) 0.4 MG SL tablet Place 0.4 mg under the tongue every 5 (five) minutes as needed for chest pain.   NOVOLOG MIX 70/30 FLEXPEN (70-30) 100 UNIT/ML FlexPen Inject 30 Units into the skin 2 (two) times daily with a meal. (Patient taking differently: Inject 40-85 Units into the skin 2 (two) times daily with a meal. Sliding scale)   ondansetron (ZOFRAN) 8 MG tablet Take 8 mg by mouth every 8 (eight) hours as needed for nausea or vomiting.   ondansetron (ZOFRAN-ODT) 4 MG disintegrating tablet Take 1 tablet (4 mg total) by mouth every 8 (eight) hours as needed for nausea or vomiting.   pantoprazole (PROTONIX) 40 MG tablet Take 1 tablet (40 mg total) by mouth 2 (two) times daily.   polyethylene glycol (MIRALAX / GLYCOLAX) 17 g packet Take 17 g by mouth  daily.   Polyethylene Glycol 400 (BLINK TEARS) 0.25 % SOLN Place 1 drop into both eyes daily as needed (dry eyes).   PROAIR RESPICLICK 123XX123 (90 Base) MCG/ACT AEPB Inhale 2 puffs into the lungs every 6 (six) hours as needed (sob/wheezing).   Probiotic Product (ALIGN) 4 MG CAPS Take 4 mg by mouth daily.   ranolazine (RANEXA) 500 MG 12 hr tablet Take 1 tablet (500 mg total) by mouth 2 (two) times daily.   simvastatin (ZOCOR) 10 MG tablet Take 1 tablet (10 mg total) by mouth daily at 6 PM.   sodium chloride (MURO 128) 5 % ophthalmic ointment Place 1 application into both eyes at bedtime.   sucralfate (CARAFATE) 1 GM/10ML suspension Take 10 mLs (1 g total) by mouth 2 (two) times daily.   VASCEPA  1 g capsule Take 1 g by mouth 2 (two) times daily.   Radiology:   No results found.  Cardiac Studies:   Echocardiogram 08/10/2021:  1. Left ventricular ejection fraction, by estimation, is 55 to 60%. The  left ventricle has normal function. Left ventricular endocardial border  not optimally defined to evaluate regional wall motion. Left ventricular  diastolic parameters are  indeterminate 2D images were obtain on 08/09/2021 LVEF 45-50% w/ RWMA  along the anterolateral and inferolateral walls. Definity images were  obtained 08/10/2021 which notes improvement in LVEF 55-60% without  regional wall motion abnormalities.   2. Right ventricular systolic function is normal. The right ventricular  size is normal. Mildly increased right ventricular wall thickness.   3. A small pericardial effusion is present. There is no evidence of  cardiac tamponade.   4. The mitral valve is normal in structure. Trivial mitral valve  regurgitation. No evidence of mitral stenosis.   5. The aortic valve is tricuspid. Aortic valve regurgitation is not  visualized. No aortic stenosis is present.   6. The inferior vena cava is normal in size with <50% respiratory  variability, suggesting right atrial pressure of 8  mmHg.  Left Heart Catheterization 08/12/21:  LV: 113/6, EDP 18 mmHg.  Ao 112/50, mean 75 mmHg.  No pressure gradient across the aortic valve. LM: Large vessel, 10 to 20% stenosis. LAD: Large vessel, it is occluded after giving origin to a small D1.  Mid to distal LAD is supplied by contralateral collaterals from the RCA.  D1 is diffusely diseased and is occluded in the mid to distal segment.  Mid to distal LAD appears to be widely patent with mild disease and a good target for bypass. RI: Small vessel, no significant disease. CX: Large vessel.  Gives origin to large OM1 which is a high-grade 95% proximal stenosis followed by 20 to 30% diffuse mid segment stenosis.  Distal vessel is a good target for bypass.  There is a large OM 2 that is ulcerated and occluded in the proximal and mid segment and appears to be diffusely diseased in the proximal and mid segment.  It is a large vessel and distal vessel appears to be a good target for bypass. RCA: Dominant, large vessel.  Gives origin to 2 PDA branches, proximal PDA branch is smooth and normal.  After this distal RCA has a high-grade 90 to 95% bifurcation stenosis involving the PL branch.  Distal RCA gives excellent collaterals to the LAD.   Impression: Mild increase in LVEDP.  LV gram not performed to conserve contrast.  Multivessel disease, ideally speaking would benefit from CABG.  However patient is essentially bedbound and wheelchair-bound due to obesity, peripheral neuropathy and generalized deconditioning.  Discussed with wife, patient does not appear to be a good candidate for CABG as his recovery would be extremely complicated.  Best option is to proceed with multivessel PCI electively, in spite of two-vessel disease, EF appears to be preserved.  I would like him to recuperate from his acute renal failure.  I have discussed all the options with patient's wife who is in agreement and favors conservative therapy/PCI over surgical therapy.  50 mL  contrast utilized. Patient presently on Eliquis for PE, I have started him on aspirin along with Plavix for now.  EKG:  08/31/2021: Sinus rhythm at a rate of 65 bpm.  Left axis.  Poor R wave progression, cannot exclude anteroseptal infarct old.  Subtle ST depression in lateral leads, unchanged compared to  previous EKG 08/10/2021.  08/10/2021: NSR, 73bpm, subtle ST depression high lateral and lateral leads, PRWP, without injury pattern.  Assessment     ICD-10-CM   1. Coronary artery disease involving native coronary artery of native heart without angina pectoris  I25.10     2. History of non-ST elevation myocardial infarction (NSTEMI)  I25.2     3. Essential hypertension  I10     4. Hypercholesteremia  E78.00        There are no discontinued medications.   No orders of the defined types were placed in this encounter.   Recommendations:   Randy Liu is a 71 y.o. Insulin-dependent diabetes mellitus type 2, chronic HFpEF, hyperlipidemia (previous statin intolerance), hypertriglyceridemia, GERD, history of PE on oral anticoagulation, predominantly bedbound due to peripheral neuropathy due to diabetes and COVID-19 infection, obesity due to excess calories.  Patient admitted 08/09/2021 - 08/13/2021 when he presented with nausea and vomiting secondary to partial small bowel obstruction.  During hospitalization patient developed chest pain and troponin became elevated, he therefore underwent cardiac catheterization 08/12/2021 for NSTEMI.  Patient was found to have multivessel disease, shared decision at the time between interventional cardiology and the patient's wife was that patient was not a candidate for CABG given poor functional status and multiple medical comorbidities, opted for medical management.   Patient was again admitted with NSTEMI and nausea/vomiting 08/31/2021 - 09/07/2021.  Patient's symptoms improved with up titration of antianginal therapy and treatment for gastric  ulcer.  He now presents for follow-up.  Patient has had no recurrence of chest pain since discharge from the hospital and it tolerating current Medical regimen without issue.  Blood pressure is well controlled.  We will continue guideline directed therapy for CAD including carvedilol, simvastatin, losartan, as well as Repatha and Vascepa.  Patient is not presently on aspirin as he is on Eliquis given history of DVT.  We will also continue antianginal therapy including Ranexa, could consider addition of isosorbide if additional antianginal therapy or hypertension control is needed.  He is tolerating Eliquis without bleeding diathesis.  No changes are made at today's office visit. Will continue medical management of CAD    Alethia Berthold, PA-C 09/16/2021, 3:16 PM Office: [phone]

## 2021-09-17 ENCOUNTER — Telehealth: Payer: Self-pay

## 2021-09-17 NOTE — Telephone Encounter (Signed)
Yes

## 2021-09-19 ENCOUNTER — Other Ambulatory Visit: Payer: Self-pay

## 2021-09-19 MED ORDER — SIMVASTATIN 10 MG PO TABS: 10.0000 mg | ORAL_TABLET | Freq: Every day | ORAL | 0 refills | Status: DC

## 2021-09-19 NOTE — Telephone Encounter (Signed)
Rx has been refilled.  

## 2021-10-02 ENCOUNTER — Other Ambulatory Visit: Payer: Self-pay | Admitting: Student

## 2021-11-20 ENCOUNTER — Encounter (HOSPITAL_COMMUNITY): Payer: Self-pay

## 2021-11-20 ENCOUNTER — Inpatient Hospital Stay (HOSPITAL_COMMUNITY)
Admission: EM | Admit: 2021-11-20 | Discharge: 2021-11-27 | DRG: 246 | Disposition: A | Discharge status: Home Health | Payer: Medicare Other | Attending: Internal Medicine | Admitting: Internal Medicine

## 2021-11-20 ENCOUNTER — Emergency Department (HOSPITAL_COMMUNITY): Payer: Medicare Other

## 2021-11-20 ENCOUNTER — Other Ambulatory Visit: Payer: Self-pay

## 2021-11-20 DIAGNOSIS — I5021 Acute systolic (congestive) heart failure: Secondary | ICD-10-CM | POA: Diagnosis present

## 2021-11-20 DIAGNOSIS — Z794 Long term (current) use of insulin: Secondary | ICD-10-CM

## 2021-11-20 DIAGNOSIS — Z9049 Acquired absence of other specified parts of digestive tract: Secondary | ICD-10-CM

## 2021-11-20 DIAGNOSIS — Z79899 Other long term (current) drug therapy: Secondary | ICD-10-CM

## 2021-11-20 DIAGNOSIS — E1169 Type 2 diabetes mellitus with other specified complication: Secondary | ICD-10-CM | POA: Diagnosis present

## 2021-11-20 DIAGNOSIS — I10 Essential (primary) hypertension: Secondary | ICD-10-CM

## 2021-11-20 DIAGNOSIS — E1122 Type 2 diabetes mellitus with diabetic chronic kidney disease: Secondary | ICD-10-CM | POA: Diagnosis present

## 2021-11-20 DIAGNOSIS — Z886 Allergy status to analgesic agent status: Secondary | ICD-10-CM

## 2021-11-20 DIAGNOSIS — Z86711 Personal history of pulmonary embolism: Secondary | ICD-10-CM

## 2021-11-20 DIAGNOSIS — R062 Wheezing: Secondary | ICD-10-CM

## 2021-11-20 DIAGNOSIS — Z8719 Personal history of other diseases of the digestive system: Secondary | ICD-10-CM

## 2021-11-20 DIAGNOSIS — G4730 Sleep apnea, unspecified: Secondary | ICD-10-CM | POA: Diagnosis present

## 2021-11-20 DIAGNOSIS — E871 Hypo-osmolality and hyponatremia: Secondary | ICD-10-CM | POA: Diagnosis present

## 2021-11-20 DIAGNOSIS — I509 Heart failure, unspecified: Secondary | ICD-10-CM

## 2021-11-20 DIAGNOSIS — G629 Polyneuropathy, unspecified: Secondary | ICD-10-CM | POA: Diagnosis present

## 2021-11-20 DIAGNOSIS — I25110 Atherosclerotic heart disease of native coronary artery with unstable angina pectoris: Secondary | ICD-10-CM | POA: Diagnosis present

## 2021-11-20 DIAGNOSIS — I9589 Other hypotension: Secondary | ICD-10-CM | POA: Diagnosis not present

## 2021-11-20 DIAGNOSIS — Z880 Allergy status to penicillin: Secondary | ICD-10-CM

## 2021-11-20 DIAGNOSIS — Z993 Dependence on wheelchair: Secondary | ICD-10-CM

## 2021-11-20 DIAGNOSIS — I214 Non-ST elevation (NSTEMI) myocardial infarction: Secondary | ICD-10-CM | POA: Diagnosis not present

## 2021-11-20 DIAGNOSIS — E1165 Type 2 diabetes mellitus with hyperglycemia: Secondary | ICD-10-CM | POA: Diagnosis not present

## 2021-11-20 DIAGNOSIS — R262 Difficulty in walking, not elsewhere classified: Secondary | ICD-10-CM | POA: Diagnosis present

## 2021-11-20 DIAGNOSIS — L723 Sebaceous cyst: Secondary | ICD-10-CM | POA: Diagnosis present

## 2021-11-20 DIAGNOSIS — I5023 Acute on chronic systolic (congestive) heart failure: Secondary | ICD-10-CM

## 2021-11-20 DIAGNOSIS — J45909 Unspecified asthma, uncomplicated: Secondary | ICD-10-CM | POA: Diagnosis present

## 2021-11-20 DIAGNOSIS — I130 Hypertensive heart and chronic kidney disease with heart failure and stage 1 through stage 4 chronic kidney disease, or unspecified chronic kidney disease: Secondary | ICD-10-CM | POA: Diagnosis present

## 2021-11-20 DIAGNOSIS — Z8249 Family history of ischemic heart disease and other diseases of the circulatory system: Secondary | ICD-10-CM

## 2021-11-20 DIAGNOSIS — K3184 Gastroparesis: Secondary | ICD-10-CM | POA: Diagnosis present

## 2021-11-20 DIAGNOSIS — E1143 Type 2 diabetes mellitus with diabetic autonomic (poly)neuropathy: Secondary | ICD-10-CM | POA: Diagnosis present

## 2021-11-20 DIAGNOSIS — N1831 Chronic kidney disease, stage 3a: Secondary | ICD-10-CM | POA: Diagnosis present

## 2021-11-20 DIAGNOSIS — Z888 Allergy status to other drugs, medicaments and biological substances status: Secondary | ICD-10-CM

## 2021-11-20 DIAGNOSIS — I452 Bifascicular block: Secondary | ICD-10-CM | POA: Diagnosis present

## 2021-11-20 DIAGNOSIS — Z7901 Long term (current) use of anticoagulants: Secondary | ICD-10-CM

## 2021-11-20 DIAGNOSIS — E782 Mixed hyperlipidemia: Secondary | ICD-10-CM | POA: Diagnosis present

## 2021-11-20 DIAGNOSIS — N179 Acute kidney failure, unspecified: Secondary | ICD-10-CM | POA: Diagnosis not present

## 2021-11-20 DIAGNOSIS — R079 Chest pain, unspecified: Principal | ICD-10-CM

## 2021-11-20 DIAGNOSIS — Z20822 Contact with and (suspected) exposure to covid-19: Secondary | ICD-10-CM | POA: Diagnosis present

## 2021-11-20 DIAGNOSIS — E785 Hyperlipidemia, unspecified: Secondary | ICD-10-CM | POA: Diagnosis present

## 2021-11-20 DIAGNOSIS — S21209A Unspecified open wound of unspecified back wall of thorax without penetration into thoracic cavity, initial encounter: Secondary | ICD-10-CM | POA: Diagnosis present

## 2021-11-20 DIAGNOSIS — Z87891 Personal history of nicotine dependence: Secondary | ICD-10-CM

## 2021-11-20 DIAGNOSIS — K5900 Constipation, unspecified: Secondary | ICD-10-CM | POA: Diagnosis not present

## 2021-11-20 DIAGNOSIS — E669 Obesity, unspecified: Secondary | ICD-10-CM | POA: Diagnosis present

## 2021-11-20 DIAGNOSIS — Z86718 Personal history of other venous thrombosis and embolism: Secondary | ICD-10-CM

## 2021-11-20 DIAGNOSIS — Z6834 Body mass index (BMI) 34.0-34.9, adult: Secondary | ICD-10-CM

## 2021-11-20 DIAGNOSIS — Z7401 Bed confinement status: Secondary | ICD-10-CM

## 2021-11-20 DIAGNOSIS — R570 Cardiogenic shock: Secondary | ICD-10-CM | POA: Diagnosis not present

## 2021-11-20 LAB — CBC WITH DIFFERENTIAL/PLATELET
Abs Immature Granulocytes: 0.04 10*3/uL (ref 0.00–0.07)
Basophils Absolute: 0 10*3/uL (ref 0.0–0.1)
Basophils Relative: 0 %
Eosinophils Absolute: 0.1 10*3/uL (ref 0.0–0.5)
Eosinophils Relative: 1 %
HCT: 40.6 % (ref 39.0–52.0)
Hemoglobin: 13.5 g/dL (ref 13.0–17.0)
Immature Granulocytes: 0 %
Lymphocytes Relative: 12 %
Lymphs Abs: 1.4 10*3/uL (ref 0.7–4.0)
MCH: 31.5 pg (ref 26.0–34.0)
MCHC: 33.3 g/dL (ref 30.0–36.0)
MCV: 94.6 fL (ref 80.0–100.0)
Monocytes Absolute: 0.9 10*3/uL (ref 0.1–1.0)
Monocytes Relative: 8 %
Neutro Abs: 8.6 10*3/uL — ABNORMAL HIGH (ref 1.7–7.7)
Neutrophils Relative %: 79 %
Platelets: 271 10*3/uL (ref 150–400)
RBC: 4.29 MIL/uL (ref 4.22–5.81)
RDW: 15.9 % — ABNORMAL HIGH (ref 11.5–15.5)
WBC: 11.1 10*3/uL — ABNORMAL HIGH (ref 4.0–10.5)
nRBC: 0 % (ref 0.0–0.2)

## 2021-11-20 LAB — COMPREHENSIVE METABOLIC PANEL
ALT: 12 U/L (ref 0–44)
AST: 19 U/L (ref 15–41)
Albumin: 3 g/dL — ABNORMAL LOW (ref 3.5–5.0)
Alkaline Phosphatase: 37 U/L — ABNORMAL LOW (ref 38–126)
Anion gap: 13 (ref 5–15)
BUN: 21 mg/dL (ref 8–23)
CO2: 21 mmol/L — ABNORMAL LOW (ref 22–32)
Calcium: 9.1 mg/dL (ref 8.9–10.3)
Chloride: 100 mmol/L (ref 98–111)
Creatinine, Ser: 1.51 mg/dL — ABNORMAL HIGH (ref 0.61–1.24)
GFR, Estimated: 49 mL/min — ABNORMAL LOW (ref >60)
Glucose, Bld: 236 mg/dL — ABNORMAL HIGH (ref 70–99)
Potassium: 4.8 mmol/L (ref 3.5–5.1)
Sodium: 134 mmol/L — ABNORMAL LOW (ref 135–145)
Total Bilirubin: 1.5 mg/dL — ABNORMAL HIGH (ref 0.3–1.2)
Total Protein: 6.3 g/dL — ABNORMAL LOW (ref 6.5–8.1)

## 2021-11-20 LAB — LIPASE, BLOOD: Lipase: 24 U/L (ref 11–51)

## 2021-11-20 LAB — BRAIN NATRIURETIC PEPTIDE: B Natriuretic Peptide: 101 pg/mL — ABNORMAL HIGH (ref 0.0–100.0)

## 2021-11-20 LAB — TROPONIN I (HIGH SENSITIVITY): Troponin I (High Sensitivity): 123 ng/L (ref <18)

## 2021-11-20 MED ORDER — ALUM & MAG HYDROXIDE-SIMETH 200-200-20 MG/5ML PO SUSP
30.0000 mL | Freq: Once | ORAL | Status: AC
  Administered 2021-11-20: 22:00:00 30 mL via ORAL
  Filled 2021-11-20: qty 30

## 2021-11-20 MED ORDER — LIDOCAINE VISCOUS HCL 2 % MT SOLN
15.0000 mL | Freq: Once | OROMUCOSAL | Status: AC
  Administered 2021-11-20: 22:00:00 15 mL via ORAL
  Filled 2021-11-20: qty 15

## 2021-11-20 NOTE — ED Provider Notes (Signed)
[facility]  Provider Note  CSN: [id number] Arrival date & time: 11/20/21 2126  History Chief Complaint  Patient presents with   Chest Pain    8/10 central chest pain started tonight.     Randy Liu is a 71 y.o. male presents today with chest pain.  He states that he began having central chest pain after eating tonight.  Rated 8/10.  He had 1 episode of vomiting.  EMS administered nitroglycerin, aspirin, and some fluids.  He felt much better afterwards.  He is currently chest pain-free.  He does report he has a history of chest pain and cardiac problems.  He has multivessel disease.  He is currently on anticoagulation.   Home Medications Prior to Admission medications   Medication Sig Start Date End Date Taking? Authorizing Provider  acetaminophen (TYLENOL) 500 MG tablet Take 1,000 mg by mouth every 8 (eight) hours as needed for moderate pain.    [provider]  Alpha-Lipoic Acid 300 MG TABS Take 600 mg by mouth daily.    [provider]  benzonatate (TESSALON) 200 MG capsule Take 200 mg by mouth every 8 (eight) hours as needed for cough. 11/18/20   [provider]  bisacodyl (DULCOLAX) 10 MG suppository Place 1 suppository (10 mg total) rectally daily as needed for up to 10 doses for moderate constipation. 05/06/21   Antonieta Pert, MD  carvedilol (COREG) 3.125 MG tablet TAKE ONE TABLET BY MOUTH TWICE A DAY WITH MEALS 10/02/21   Cantwell, Celeste C, PA-C  Cholecalciferol 25 MCG (1000 UT) capsule Take 4,000 Units by mouth daily.    [provider]  colchicine-probenecid 0.5-500 MG tablet Take 1 tablet by mouth daily. 11/21/20   [provider]  cyanocobalamin (,VITAMIN B-12,) 1000 MCG/ML injection Inject 1,000 mcg into the skin every 30 (thirty) days. 09/23/20   [provider]  ELIQUIS 5 MG TABS tablet Take 1 tablet (5 mg total) by mouth 2 (two) times daily. Resume on 05/11/21 Patient taking  differently: Take 5 mg by mouth 2 (two) times daily. 05/11/21   Antonieta Pert, MD  Evolocumab (REPATHA SURECLICK) [id number] MG/ML SOAJ Inject 1 mL into the skin every 14 (fourteen) days. 08/27/21   Adrian Prows, MD  fenofibrate 160 MG tablet Take 160 mg by mouth daily. 12/04/20   [provider]  fluticasone (FLONASE) 50 MCG/ACT nasal spray Place 1 spray into both nostrils daily. 07/08/21   [provider]  guaiFENesin (MUCINEX) 600 MG 12 hr tablet Take 600 mg by mouth 2 (two) times daily as needed for cough.    [provider]  latanoprost (XALATAN) 0.005 % ophthalmic solution Place 1 drop into the left eye at bedtime. 10/29/20   [provider]  losartan (COZAAR) 25 MG tablet Take 1 tablet (25 mg total) by mouth daily. 08/20/21 12/18/21  Cantwell, Celeste C, PA-C  magnesium oxide (MAG-OX) 400 MG tablet Take 400 mg by mouth 2 (two) times daily. 10/09/20   [provider]  meclizine (ANTIVERT) 25 MG tablet Take 25 mg by mouth 3 (three) times daily as needed for nausea.    [provider]  metoCLOPramide (REGLAN) 5 MG tablet Take 2.5 mg by mouth at bedtime. 10/09/20   [provider]  montelukast (SINGULAIR) 10 MG tablet Take 10 mg by mouth at bedtime. 09/28/20   [provider]  nitroGLYCERIN (NITROSTAT) 0.4 MG SL tablet Place 0.4 mg under the tongue every 5 (five) minutes as needed for  chest pain.    [provider]  NOVOLOG MIX 70/30 FLEXPEN (70-30) 100 UNIT/ML FlexPen Inject 30 Units into the skin 2 (two) times daily with a meal. Patient taking differently: Inject 40-85 Units into the skin 2 (two) times daily with a meal. Sliding scale 05/09/21   Antonieta Pert, MD  ondansetron (ZOFRAN) 8 MG tablet Take 8 mg by mouth every 8 (eight) hours as needed for nausea or vomiting.    [provider]  ondansetron (ZOFRAN-ODT) 4 MG disintegrating tablet Take 1 tablet (4 mg total) by mouth every 8 (eight) hours as needed for nausea or vomiting.  09/05/21   Sharen Hones, MD  pantoprazole (PROTONIX) 40 MG tablet Take 1 tablet (40 mg total) by mouth 2 (two) times daily. 09/05/21   Sharen Hones, MD  polyethylene glycol (MIRALAX / GLYCOLAX) 17 g packet Take 17 g by mouth daily. 09/05/21   Sharen Hones, MD  Polyethylene Glycol 400 (BLINK TEARS) 0.25 % SOLN Place 1 drop into both eyes daily as needed (dry eyes).    [provider]  PROAIR RESPICLICK 123XX123 (90 Base) MCG/ACT AEPB Inhale 2 puffs into the lungs every 6 (six) hours as needed (sob/wheezing). 04/03/21   [provider]  Probiotic Product (ALIGN) 4 MG CAPS Take 4 mg by mouth daily.    [provider]  ranolazine (RANEXA) 500 MG 12 hr tablet TAKE ONE TABLET BY MOUTH TWICE DAILY 10/02/21   Cantwell, Celeste C, PA-C  simvastatin (ZOCOR) 10 MG tablet Take 1 tablet (10 mg total) by mouth daily at 6 PM. 09/19/21 10/19/21  Cantwell, Celeste C, PA-C  sodium chloride (MURO 128) 5 % ophthalmic ointment Place 1 application into both eyes at bedtime.    [provider]  sucralfate (CARAFATE) 1 GM/10ML suspension Take 10 mLs (1 g total) by mouth 2 (two) times daily. 09/05/21   Sharen Hones, MD  VASCEPA 1 g capsule Take 1 g by mouth 2 (two) times daily. 08/13/21   [provider]     Allergies    Metoclopramide, Nsaids, Amoxicillin-pot clavulanate, Statins, Allopurinol, Empagliflozin, and Tiotropium   Review of Systems   Review of Systems  Constitutional:  Negative for chills and fever.  HENT:  Negative for ear pain and sore throat.   Eyes:  Negative for pain and visual disturbance.  Respiratory:  Negative for cough and shortness of breath.   Cardiovascular:  Positive for chest pain. Negative for palpitations.  Gastrointestinal:  Negative for abdominal pain and vomiting.  Genitourinary:  Negative for dysuria and hematuria.  Musculoskeletal:  Negative for arthralgias and back pain.  Skin:  Negative for color change and rash.  Neurological:  Negative for  seizures and syncope.  All other systems reviewed and are negative. Please see HPI for pertinent positives and negatives  Physical Exam BP 125/78    Pulse (!) 103    Temp 98.1 F (36.7 C) (Oral)    Ht 6\' 1"  (1.854 m)    Wt 119.3 kg    SpO2 96%    BMI 34.70 kg/m   Physical Exam Vitals and nursing note reviewed.  Constitutional:      General: He is not in acute distress.    Appearance: He is well-developed. He is obese.  HENT:     Head: Normocephalic and atraumatic.  Eyes:     Conjunctiva/sclera: Conjunctivae normal.  Cardiovascular:     Rate and Rhythm: Normal rate and regular rhythm.     Heart sounds: No murmur heard.  Pulmonary:     Effort: Pulmonary effort is normal. No respiratory distress.     Breath sounds: Normal breath sounds.  Abdominal:     Palpations: Abdomen is soft.     Tenderness: There is no abdominal tenderness.  Musculoskeletal:        General: No swelling.     Cervical back: Neck supple.  Skin:    General: Skin is warm and dry.     Capillary Refill: Capillary refill takes less than 2 seconds.  Neurological:     Mental Status: He is alert.  Psychiatric:        Mood and Affect: Mood normal.    ED Results / Procedures / Treatments   EKG EKG Interpretation  Date/Time:  Thursday November 20 2021 21:46:24 EST Ventricular Rate:  108 PR Interval:  173 QRS Duration: 115 QT Interval:  346 QTC Calculation: 464 R Axis:   266 Text Interpretation: Sinus tachycardia Incomplete RBBB and LAFB Anterior infarct, old Since prior ECG, rate has increased Confirmed by Gareth Morgan [phone]) on 11/20/2021 10:02:36 PM  Procedures Procedures  Medications Ordered in the ED Medications  alum & mag hydroxide-simeth (MAALOX/MYLANTA) 200-200-20 MG/5ML suspension 30 mL (30 mLs Oral Given 11/20/21 2159)    And  lidocaine (XYLOCAINE) 2 % viscous mouth solution 15 mL (15 mLs Oral Given 11/20/21 2159)     ED Course       MDM   This patient presents to the ED for concern of  chest pain, this involves an extensive number of treatment options, and is a complaint that carries with it a high risk of complications and morbidity.  The differential diagnosis includes ACS, unstable angina, GERD. Patients presentation is complicated by their history of multivessel disease, previous recommendation for CABG versus drug-eluting stents.  Additional history obtained: Additional history obtained from family and EMS  Records reviewed previous admission documents, Care Everywhere/External Records, and Primary Care Documents  Lab Tests: I Ordered, and personally interpreted labs.  The pertinent results include: Troponin was elevated at 123.  Second troponin was pending.  Remainder of labs are unremarkable.  Imaging Studies ordered: I ordered imaging studies including X-ray chest   I independently visualized and interpreted imaging which showed abnormalities I agree with the radiologist interpretation  EKG (personally reviewed and interpreted): No STEMI.   Medical Decision Making: Patient presented with chest pain.  He has a complex history that includes multivessel disease.  He has been catheterized within the last year and was found to have disease that would be best suited for CABG.  However, he is not a candidate for this operation.  Cardiology has recommended outpatient cath for stenting potentially.  Tonight he had a return of chest pain.  Mostly located in the middle of his chest.  His initial troponin was elevated at 123, this is down from previous but still elevated.  He has no acute findings on his EKG to suggest a STEMI.  Patient was signed out to the oncoming team while awaiting his second troponin.  Patient likely will need to be admitted for high risk chest pain.  Patient is currently on Eliquis.  Complexity of problems addressed: Patients presentation is most consistent with  acute presentation with potential threat to life or bodily  function  Disposition: TBD  Patient seen in conjunction with my attending, Dr. Billy Fischer.    Final Clinical Impression(s) / ED Diagnoses Final diagnoses:  Chest pain, unspecified type    Rx / DC Orders ED Discharge Orders  None         Jacelyn Pi, MD 11/20/21 [id number]    Gareth Morgan, MD 11/21/21 1120

## 2021-11-20 NOTE — ED Triage Notes (Signed)
Pt began having chest pain tonight after eating. Pt with emesisx1. EMS administered nitro, asa, 300 ml bolus. After nitro pt bp dropped to 90/50. Pt arrives A&OX4 and denies chest pain. Pt with extensive cardiac history ?

## 2021-11-20 NOTE — ED Notes (Signed)
CRITICAL VALUE STICKER ? ?CRITICAL VALUE:trop 123 ? ?RECEIVER (on-site recipient of call):Idalis Hoelting ? ?DATE & TIME NOTIFIED: 11/20/21 ? ?MESSENGER (representative from lab):amanda ? ?MD NOTIFIED: schlossman ? ?TIME OF NOTIFICATION:2255 ? ?RESPONSE:   ?

## 2021-11-21 ENCOUNTER — Inpatient Hospital Stay (HOSPITAL_COMMUNITY): Payer: Medicare Other

## 2021-11-21 ENCOUNTER — Emergency Department (HOSPITAL_COMMUNITY): Payer: Medicare Other

## 2021-11-21 ENCOUNTER — Inpatient Hospital Stay (HOSPITAL_COMMUNITY)
Admission: EM | Disposition: A | Discharge status: Home Health | Payer: Self-pay | Source: Home / Self Care | Attending: Internal Medicine

## 2021-11-21 DIAGNOSIS — I5021 Acute systolic (congestive) heart failure: Secondary | ICD-10-CM

## 2021-11-21 DIAGNOSIS — E1169 Type 2 diabetes mellitus with other specified complication: Secondary | ICD-10-CM

## 2021-11-21 DIAGNOSIS — I214 Non-ST elevation (NSTEMI) myocardial infarction: Secondary | ICD-10-CM

## 2021-11-21 DIAGNOSIS — N1831 Chronic kidney disease, stage 3a: Secondary | ICD-10-CM | POA: Diagnosis present

## 2021-11-21 DIAGNOSIS — Z888 Allergy status to other drugs, medicaments and biological substances status: Secondary | ICD-10-CM | POA: Diagnosis not present

## 2021-11-21 DIAGNOSIS — E669 Obesity, unspecified: Secondary | ICD-10-CM | POA: Diagnosis present

## 2021-11-21 DIAGNOSIS — J45909 Unspecified asthma, uncomplicated: Secondary | ICD-10-CM | POA: Diagnosis present

## 2021-11-21 DIAGNOSIS — E785 Hyperlipidemia, unspecified: Secondary | ICD-10-CM | POA: Diagnosis not present

## 2021-11-21 DIAGNOSIS — I9589 Other hypotension: Secondary | ICD-10-CM | POA: Diagnosis not present

## 2021-11-21 DIAGNOSIS — R570 Cardiogenic shock: Secondary | ICD-10-CM | POA: Diagnosis not present

## 2021-11-21 DIAGNOSIS — Z20822 Contact with and (suspected) exposure to covid-19: Secondary | ICD-10-CM | POA: Diagnosis present

## 2021-11-21 DIAGNOSIS — I452 Bifascicular block: Secondary | ICD-10-CM | POA: Diagnosis present

## 2021-11-21 DIAGNOSIS — G4730 Sleep apnea, unspecified: Secondary | ICD-10-CM | POA: Diagnosis present

## 2021-11-21 DIAGNOSIS — N179 Acute kidney failure, unspecified: Secondary | ICD-10-CM | POA: Diagnosis not present

## 2021-11-21 DIAGNOSIS — E1122 Type 2 diabetes mellitus with diabetic chronic kidney disease: Secondary | ICD-10-CM | POA: Diagnosis present

## 2021-11-21 DIAGNOSIS — E782 Mixed hyperlipidemia: Secondary | ICD-10-CM | POA: Diagnosis present

## 2021-11-21 DIAGNOSIS — J4520 Mild intermittent asthma, uncomplicated: Secondary | ICD-10-CM | POA: Diagnosis not present

## 2021-11-21 DIAGNOSIS — E1165 Type 2 diabetes mellitus with hyperglycemia: Secondary | ICD-10-CM | POA: Diagnosis not present

## 2021-11-21 DIAGNOSIS — I5023 Acute on chronic systolic (congestive) heart failure: Secondary | ICD-10-CM

## 2021-11-21 DIAGNOSIS — R262 Difficulty in walking, not elsewhere classified: Secondary | ICD-10-CM | POA: Diagnosis not present

## 2021-11-21 DIAGNOSIS — L723 Sebaceous cyst: Secondary | ICD-10-CM | POA: Diagnosis present

## 2021-11-21 DIAGNOSIS — I130 Hypertensive heart and chronic kidney disease with heart failure and stage 1 through stage 4 chronic kidney disease, or unspecified chronic kidney disease: Secondary | ICD-10-CM | POA: Diagnosis present

## 2021-11-21 DIAGNOSIS — Z79899 Other long term (current) drug therapy: Secondary | ICD-10-CM | POA: Diagnosis not present

## 2021-11-21 DIAGNOSIS — E871 Hypo-osmolality and hyponatremia: Secondary | ICD-10-CM

## 2021-11-21 DIAGNOSIS — Z794 Long term (current) use of insulin: Secondary | ICD-10-CM | POA: Diagnosis not present

## 2021-11-21 DIAGNOSIS — G629 Polyneuropathy, unspecified: Secondary | ICD-10-CM | POA: Diagnosis present

## 2021-11-21 DIAGNOSIS — E1143 Type 2 diabetes mellitus with diabetic autonomic (poly)neuropathy: Secondary | ICD-10-CM | POA: Diagnosis present

## 2021-11-21 DIAGNOSIS — I25110 Atherosclerotic heart disease of native coronary artery with unstable angina pectoris: Secondary | ICD-10-CM | POA: Diagnosis present

## 2021-11-21 DIAGNOSIS — I10 Essential (primary) hypertension: Secondary | ICD-10-CM

## 2021-11-21 DIAGNOSIS — K3184 Gastroparesis: Secondary | ICD-10-CM | POA: Diagnosis present

## 2021-11-21 HISTORY — PX: INTRAVASCULAR ULTRASOUND/IVUS: CATH118244

## 2021-11-21 HISTORY — PX: LEFT HEART CATH AND CORONARY ANGIOGRAPHY: CATH118249

## 2021-11-21 HISTORY — PX: CORONARY STENT INTERVENTION: CATH118234

## 2021-11-21 LAB — LIPID PANEL
Cholesterol: 151 mg/dL (ref 0–200)
HDL: 19 mg/dL — ABNORMAL LOW (ref >40)
LDL Cholesterol: 84 mg/dL (ref 0–99)
Total CHOL/HDL Ratio: 7.9 RATIO
Triglycerides: 241 mg/dL — ABNORMAL HIGH (ref <150)
VLDL: 48 mg/dL — ABNORMAL HIGH (ref 0–40)

## 2021-11-21 LAB — BASIC METABOLIC PANEL
Anion gap: 14 (ref 5–15)
BUN: 21 mg/dL (ref 8–23)
CO2: 18 mmol/L — ABNORMAL LOW (ref 22–32)
Calcium: 8.9 mg/dL (ref 8.9–10.3)
Chloride: 99 mmol/L (ref 98–111)
Creatinine, Ser: 1.47 mg/dL — ABNORMAL HIGH (ref 0.61–1.24)
GFR, Estimated: 50 mL/min — ABNORMAL LOW (ref >60)
Glucose, Bld: 224 mg/dL — ABNORMAL HIGH (ref 70–99)
Potassium: 4.8 mmol/L (ref 3.5–5.1)
Sodium: 131 mmol/L — ABNORMAL LOW (ref 135–145)

## 2021-11-21 LAB — TROPONIN I (HIGH SENSITIVITY): Troponin I (High Sensitivity): 887 ng/L (ref <18)

## 2021-11-21 LAB — LACTIC ACID, PLASMA: Lactic Acid, Venous: 2.2 mmol/L (ref 0.5–1.9)

## 2021-11-21 LAB — CBC
HCT: 38.6 % — ABNORMAL LOW (ref 39.0–52.0)
Hemoglobin: 13 g/dL (ref 13.0–17.0)
MCH: 32 pg (ref 26.0–34.0)
MCHC: 33.7 g/dL (ref 30.0–36.0)
MCV: 95.1 fL (ref 80.0–100.0)
Platelets: 222 10*3/uL (ref 150–400)
RBC: 4.06 MIL/uL — ABNORMAL LOW (ref 4.22–5.81)
RDW: 16 % — ABNORMAL HIGH (ref 11.5–15.5)
WBC: 8.8 10*3/uL (ref 4.0–10.5)
nRBC: 0 % (ref 0.0–0.2)

## 2021-11-21 LAB — BRAIN NATRIURETIC PEPTIDE: B Natriuretic Peptide: 314.3 pg/mL — ABNORMAL HIGH (ref 0.0–100.0)

## 2021-11-21 LAB — HEMOGLOBIN A1C
Hgb A1c MFr Bld: 6.7 % — ABNORMAL HIGH (ref 4.8–5.6)
Mean Plasma Glucose: 145.59 mg/dL

## 2021-11-21 LAB — GLUCOSE, CAPILLARY
Glucose-Capillary: 217 mg/dL — ABNORMAL HIGH (ref 70–99)
Glucose-Capillary: 210 mg/dL — ABNORMAL HIGH (ref 70–99)
Glucose-Capillary: 232 mg/dL — ABNORMAL HIGH (ref 70–99)
Glucose-Capillary: 245 mg/dL — ABNORMAL HIGH (ref 70–99)
Glucose-Capillary: 232 mg/dL — ABNORMAL HIGH (ref 70–99)

## 2021-11-21 LAB — RESP PANEL BY RT-PCR (FLU A&B, COVID) ARPGX2
Influenza A by PCR: NEGATIVE
Influenza B by PCR: NEGATIVE
SARS Coronavirus 2 by RT PCR: NEGATIVE

## 2021-11-21 LAB — ECHOCARDIOGRAM LIMITED
Height: 73 in
Single Plane A4C EF: 22.1 %
Weight: 4084.68 oz

## 2021-11-21 LAB — POCT ACTIVATED CLOTTING TIME
Activated Clotting Time: 341 seconds
Activated Clotting Time: 365 seconds

## 2021-11-21 LAB — MRSA NEXT GEN BY PCR, NASAL

## 2021-11-21 SURGERY — LEFT HEART CATH AND CORONARY ANGIOGRAPHY
Anesthesia: LOCAL

## 2021-11-21 MED ORDER — NOREPINEPHRINE BITARTRATE 1 MG/ML IV SOLN
INTRAVENOUS | Status: DC | PRN
  Administered 2021-11-21: 10 ug/kg/min via INTRAVENOUS

## 2021-11-21 MED ORDER — IOHEXOL 350 MG/ML SOLN
INTRAVENOUS | Status: DC | PRN
  Administered 2021-11-21: 80 mL

## 2021-11-21 MED ORDER — RISAQUAD PO CAPS
1.0000 | ORAL_CAPSULE | Freq: Every day | ORAL | Status: DC
  Administered 2021-11-22 – 2021-11-27 (×6): 1 via ORAL
  Filled 2021-11-21 (×7): qty 1

## 2021-11-21 MED ORDER — NITROGLYCERIN IN D5W 200-5 MCG/ML-% IV SOLN
0.0000 ug/min | INTRAVENOUS | Status: DC
  Administered 2021-11-21: 5 ug/min via INTRAVENOUS
  Filled 2021-11-21: qty 250

## 2021-11-21 MED ORDER — FENTANYL CITRATE (PF) 100 MCG/2ML IJ SOLN
INTRAMUSCULAR | Status: DC | PRN
  Administered 2021-11-21 (×2): 25 ug via INTRAVENOUS

## 2021-11-21 MED ORDER — MORPHINE SULFATE (PF) 2 MG/ML IV SOLN
INTRAVENOUS | Status: AC
  Filled 2021-11-21: qty 1

## 2021-11-21 MED ORDER — FUROSEMIDE 10 MG/ML IJ SOLN
INTRAMUSCULAR | Status: AC
  Filled 2021-11-21: qty 4

## 2021-11-21 MED ORDER — ACETAMINOPHEN 325 MG PO TABS
650.0000 mg | ORAL_TABLET | ORAL | Status: DC | PRN
  Administered 2021-11-26 – 2021-11-27 (×2): 650 mg via ORAL
  Filled 2021-11-21 (×2): qty 2

## 2021-11-21 MED ORDER — HEPARIN (PORCINE) 25000 UT/250ML-% IV SOLN
1550.0000 [IU]/h | INTRAVENOUS | Status: DC
  Administered 2021-11-21: 1400 [IU]/h via INTRAVENOUS
  Filled 2021-11-21: qty 250

## 2021-11-21 MED ORDER — INSULIN ASPART 100 UNIT/ML IJ SOLN
0.0000 [IU] | Freq: Three times a day (TID) | INTRAMUSCULAR | Status: DC
  Administered 2021-11-21 (×2): 3 [IU] via SUBCUTANEOUS
  Administered 2021-11-22 (×2): 2 [IU] via SUBCUTANEOUS
  Administered 2021-11-22 (×2): 3 [IU] via SUBCUTANEOUS
  Administered 2021-11-23: 1 [IU] via SUBCUTANEOUS
  Administered 2021-11-23 (×2): 2 [IU] via SUBCUTANEOUS
  Administered 2021-11-24: 1 [IU] via SUBCUTANEOUS
  Administered 2021-11-24: 3 [IU] via SUBCUTANEOUS
  Administered 2021-11-24: 2 [IU] via SUBCUTANEOUS
  Administered 2021-11-25: 1 [IU] via SUBCUTANEOUS
  Administered 2021-11-25 (×2): 2 [IU] via SUBCUTANEOUS
  Administered 2021-11-26 (×3): 1 [IU] via SUBCUTANEOUS
  Administered 2021-11-27: 2 [IU] via SUBCUTANEOUS

## 2021-11-21 MED ORDER — MORPHINE SULFATE (PF) 2 MG/ML IV SOLN
2.0000 mg | Freq: Once | INTRAVENOUS | Status: AC
  Administered 2021-11-21: 2 mg via INTRAVENOUS
  Filled 2021-11-21: qty 1

## 2021-11-21 MED ORDER — SODIUM CHLORIDE 0.9 % IV SOLN
INTRAVENOUS | Status: AC | PRN
  Administered 2021-11-21: 10 mL/h via INTRAVENOUS

## 2021-11-21 MED ORDER — ONDANSETRON HCL 4 MG/2ML IJ SOLN
INTRAMUSCULAR | Status: DC | PRN
  Administered 2021-11-21: 4 mg via INTRAVENOUS

## 2021-11-21 MED ORDER — FENTANYL CITRATE (PF) 100 MCG/2ML IJ SOLN
INTRAMUSCULAR | Status: AC
  Filled 2021-11-21: qty 2

## 2021-11-21 MED ORDER — HEPARIN SODIUM (PORCINE) 1000 UNIT/ML IJ SOLN
INTRAMUSCULAR | Status: DC | PRN
  Administered 2021-11-21: 6000 [IU] via INTRAVENOUS
  Administered 2021-11-21: 5000 [IU] via INTRAVENOUS

## 2021-11-21 MED ORDER — MIDAZOLAM HCL 2 MG/2ML IJ SOLN
INTRAMUSCULAR | Status: AC
  Filled 2021-11-21: qty 2

## 2021-11-21 MED ORDER — HYDRALAZINE HCL 20 MG/ML IJ SOLN: 10.0000 mg | INTRAMUSCULAR | Status: AC | PRN

## 2021-11-21 MED ORDER — ALPRAZOLAM 0.25 MG PO TABS
0.2500 mg | ORAL_TABLET | Freq: Two times a day (BID) | ORAL | Status: DC | PRN
  Administered 2021-11-22: 0.25 mg via ORAL
  Filled 2021-11-21: qty 1

## 2021-11-21 MED ORDER — ALBUTEROL SULFATE (2.5 MG/3ML) 0.083% IN NEBU: 2.5000 mg | INHALATION_SOLUTION | Freq: Four times a day (QID) | RESPIRATORY_TRACT | Status: DC | PRN

## 2021-11-21 MED ORDER — FUROSEMIDE 10 MG/ML IJ SOLN
40.0000 mg | Freq: Once | INTRAMUSCULAR | Status: AC
  Administered 2021-11-21: 40 mg via INTRAVENOUS

## 2021-11-21 MED ORDER — NITROGLYCERIN 0.4 MG SL SUBL: 0.4000 mg | SUBLINGUAL_TABLET | SUBLINGUAL | Status: DC | PRN

## 2021-11-21 MED ORDER — POLYETHYLENE GLYCOL 3350 17 G PO PACK
17.0000 g | PACK | Freq: Every day | ORAL | Status: DC
  Administered 2021-11-21 – 2021-11-25 (×5): 17 g via ORAL
  Filled 2021-11-21 (×7): qty 1

## 2021-11-21 MED ORDER — SODIUM CHLORIDE 0.9 % IV SOLN: 250.0000 mL | INTRAVENOUS | Status: DC | PRN

## 2021-11-21 MED ORDER — HEPARIN (PORCINE) 25000 UT/250ML-% IV SOLN
1400.0000 [IU]/h | INTRAVENOUS | Status: DC
  Administered 2021-11-21: 1400 [IU]/h via INTRAVENOUS
  Filled 2021-11-21: qty 250

## 2021-11-21 MED ORDER — MECLIZINE HCL 25 MG PO TABS
25.0000 mg | ORAL_TABLET | Freq: Three times a day (TID) | ORAL | Status: DC | PRN
  Filled 2021-11-21: qty 1

## 2021-11-21 MED ORDER — VERAPAMIL HCL 2.5 MG/ML IV SOLN
INTRAVENOUS | Status: DC | PRN
  Administered 2021-11-21: 10 mL via INTRA_ARTERIAL

## 2021-11-21 MED ORDER — LABETALOL HCL 5 MG/ML IV SOLN: 10.0000 mg | INTRAVENOUS | Status: AC | PRN

## 2021-11-21 MED ORDER — ONDANSETRON HCL 4 MG/2ML IJ SOLN
INTRAMUSCULAR | Status: AC
  Filled 2021-11-21: qty 2

## 2021-11-21 MED ORDER — LOSARTAN POTASSIUM 25 MG PO TABS
25.0000 mg | ORAL_TABLET | Freq: Every day | ORAL | Status: DC
  Administered 2021-11-22 – 2021-11-23 (×2): 25 mg via ORAL
  Filled 2021-11-21 (×3): qty 1

## 2021-11-21 MED ORDER — FUROSEMIDE 10 MG/ML IJ SOLN: 40.0000 mg | Freq: Once | INTRAMUSCULAR | Status: DC

## 2021-11-21 MED ORDER — ASPIRIN 81 MG PO CHEW: 81.0000 mg | CHEWABLE_TABLET | ORAL | Status: DC

## 2021-11-21 MED ORDER — VITAMIN D 25 MCG (1000 UNIT) PO TABS
4000.0000 [IU] | ORAL_TABLET | Freq: Every day | ORAL | Status: DC
  Administered 2021-11-22 – 2021-11-27 (×6): 4000 [IU] via ORAL
  Filled 2021-11-21 (×7): qty 4

## 2021-11-21 MED ORDER — ACETAMINOPHEN 325 MG PO TABS: 650.0000 mg | ORAL_TABLET | ORAL | Status: DC | PRN

## 2021-11-21 MED ORDER — PHENYLEPHRINE HCL (PRESSORS) 10 MG/ML IV SOLN
INTRAVENOUS | Status: AC
  Filled 2021-11-21: qty 1

## 2021-11-21 MED ORDER — ONDANSETRON 4 MG PO TBDP
4.0000 mg | ORAL_TABLET | Freq: Three times a day (TID) | ORAL | Status: DC | PRN
  Filled 2021-11-21: qty 1

## 2021-11-21 MED ORDER — MORPHINE SULFATE (PF) 2 MG/ML IV SOLN
INTRAVENOUS | Status: DC | PRN
  Administered 2021-11-21: 2 mg via INTRAVENOUS

## 2021-11-21 MED ORDER — ICOSAPENT ETHYL 1 G PO CAPS
1.0000 g | ORAL_CAPSULE | Freq: Two times a day (BID) | ORAL | Status: DC
  Administered 2021-11-22 – 2021-11-27 (×11): 1 g via ORAL
  Filled 2021-11-21 (×16): qty 1

## 2021-11-21 MED ORDER — ONDANSETRON HCL 4 MG PO TABS: 8.0000 mg | ORAL_TABLET | Freq: Three times a day (TID) | ORAL | Status: DC | PRN

## 2021-11-21 MED ORDER — SODIUM CHLORIDE 0.9% FLUSH: 3.0000 mL | INTRAVENOUS | Status: DC | PRN

## 2021-11-21 MED ORDER — SODIUM CHLORIDE 0.9% FLUSH
3.0000 mL | Freq: Two times a day (BID) | INTRAVENOUS | Status: DC
  Administered 2021-11-21 – 2021-11-27 (×11): 3 mL via INTRAVENOUS

## 2021-11-21 MED ORDER — LIDOCAINE HCL (PF) 1 % IJ SOLN
INTRAMUSCULAR | Status: DC | PRN
  Administered 2021-11-21: 2 mL

## 2021-11-21 MED ORDER — BISACODYL 10 MG RE SUPP: 10.0000 mg | Freq: Every day | RECTAL | Status: DC | PRN

## 2021-11-21 MED ORDER — EVOLOCUMAB 140 MG/ML ~~LOC~~ SOAJ: 1.0000 mL | SUBCUTANEOUS | Status: DC

## 2021-11-21 MED ORDER — HEPARIN SODIUM (PORCINE) 1000 UNIT/ML IJ SOLN
INTRAMUSCULAR | Status: AC
  Filled 2021-11-21: qty 10

## 2021-11-21 MED ORDER — ASPIRIN 325 MG PO TABS
ORAL_TABLET | ORAL | Status: DC | PRN
  Administered 2021-11-21: 325 mg via ORAL

## 2021-11-21 MED ORDER — ONDANSETRON HCL 4 MG/2ML IJ SOLN: 4.0000 mg | Freq: Four times a day (QID) | INTRAMUSCULAR | Status: DC | PRN

## 2021-11-21 MED ORDER — RANOLAZINE ER 500 MG PO TB12
500.0000 mg | ORAL_TABLET | Freq: Two times a day (BID) | ORAL | Status: DC
  Administered 2021-11-21 – 2021-11-22 (×3): 500 mg via ORAL
  Filled 2021-11-21 (×3): qty 1

## 2021-11-21 MED ORDER — SODIUM CHLORIDE 0.9 % IV SOLN: INTRAVENOUS | Status: DC

## 2021-11-21 MED ORDER — FENOFIBRATE 160 MG PO TABS
160.0000 mg | ORAL_TABLET | Freq: Every day | ORAL | Status: DC
  Administered 2021-11-22 – 2021-11-27 (×6): 160 mg via ORAL
  Filled 2021-11-21 (×7): qty 1

## 2021-11-21 MED ORDER — NOREPINEPHRINE 4 MG/250ML-% IV SOLN
INTRAVENOUS | Status: AC
Start: 2021-11-21 — End: ?
  Filled 2021-11-21: qty 250

## 2021-11-21 MED ORDER — LIDOCAINE HCL (PF) 1 % IJ SOLN
INTRAMUSCULAR | Status: AC
  Filled 2021-11-21: qty 30

## 2021-11-21 MED ORDER — CLOPIDOGREL BISULFATE 75 MG PO TABS
75.0000 mg | ORAL_TABLET | Freq: Every day | ORAL | Status: DC
  Administered 2021-11-21 – 2021-11-27 (×7): 75 mg via ORAL
  Filled 2021-11-21 (×7): qty 1

## 2021-11-21 MED ORDER — NITROGLYCERIN 1 MG/10 ML FOR IR/CATH LAB
INTRA_ARTERIAL | Status: DC | PRN
  Administered 2021-11-21: 50 ug via INTRACORONARY
  Administered 2021-11-21: 100 ug via INTRACORONARY

## 2021-11-21 MED ORDER — HEPARIN (PORCINE) IN NACL 1000-0.9 UT/500ML-% IV SOLN
INTRAVENOUS | Status: AC
Start: 2021-11-21 — End: ?
  Filled 2021-11-21: qty 500

## 2021-11-21 MED ORDER — MIDAZOLAM HCL 2 MG/2ML IJ SOLN
INTRAMUSCULAR | Status: DC | PRN
  Administered 2021-11-21 (×2): 1 mg via INTRAVENOUS

## 2021-11-21 MED ORDER — SODIUM CHLORIDE 0.9% FLUSH: 3.0000 mL | Freq: Two times a day (BID) | INTRAVENOUS | Status: DC

## 2021-11-21 MED ORDER — METOPROLOL TARTRATE 25 MG PO TABS: 25.0000 mg | ORAL_TABLET | Freq: Two times a day (BID) | ORAL | Status: DC

## 2021-11-21 MED ORDER — METOPROLOL TARTRATE 25 MG PO TABS
25.0000 mg | ORAL_TABLET | Freq: Once | ORAL | Status: AC
Start: 2021-11-21 — End: 2021-11-21
  Administered 2021-11-21: 25 mg via ORAL
  Filled 2021-11-21: qty 1

## 2021-11-21 MED ORDER — SODIUM CHLORIDE 0.9 % WEIGHT BASED INFUSION
3.0000 mL/kg/h | INTRAVENOUS | Status: DC
  Administered 2021-11-21: 3 mL/kg/h via INTRAVENOUS

## 2021-11-21 MED ORDER — MAGNESIUM OXIDE -MG SUPPLEMENT 400 (240 MG) MG PO TABS
400.0000 mg | ORAL_TABLET | Freq: Two times a day (BID) | ORAL | Status: DC
  Administered 2021-11-21 – 2021-11-27 (×13): 400 mg via ORAL
  Filled 2021-11-21 (×13): qty 1

## 2021-11-21 MED ORDER — NITROGLYCERIN 0.4 MG SL SUBL
0.4000 mg | SUBLINGUAL_TABLET | SUBLINGUAL | Status: DC | PRN
  Administered 2021-11-21: 0.4 mg via SUBLINGUAL
  Filled 2021-11-21: qty 1

## 2021-11-21 MED ORDER — COLCHICINE-PROBENECID 0.5-500 MG PO TABS
1.0000 | ORAL_TABLET | Freq: Every day | ORAL | Status: DC
  Administered 2021-11-21 – 2021-11-27 (×7): 1 via ORAL
  Filled 2021-11-21 (×7): qty 1

## 2021-11-21 MED ORDER — POLYVINYL ALCOHOL 1.4 % OP SOLN: 1.0000 [drp] | Freq: Every day | OPHTHALMIC | Status: DC | PRN

## 2021-11-21 MED ORDER — NITROGLYCERIN 1 MG/10 ML FOR IR/CATH LAB
INTRA_ARTERIAL | Status: AC
  Filled 2021-11-21: qty 10

## 2021-11-21 MED ORDER — IOHEXOL 300 MG/ML  SOLN
100.0000 mL | Freq: Once | INTRAMUSCULAR | Status: AC | PRN
  Administered 2021-11-21: 100 mL via INTRAVENOUS

## 2021-11-21 MED ORDER — SENNOSIDES-DOCUSATE SODIUM 8.6-50 MG PO TABS
1.0000 | ORAL_TABLET | Freq: Every day | ORAL | Status: DC
  Administered 2021-11-22 – 2021-11-27 (×6): 1 via ORAL
  Filled 2021-11-21 (×7): qty 1

## 2021-11-21 MED ORDER — ONDANSETRON HCL 4 MG/2ML IJ SOLN
4.0000 mg | Freq: Four times a day (QID) | INTRAMUSCULAR | Status: DC | PRN
  Administered 2021-11-22 – 2021-11-23 (×2): 4 mg via INTRAVENOUS
  Filled 2021-11-21 (×2): qty 2

## 2021-11-21 MED ORDER — ONDANSETRON HCL 4 MG/2ML IJ SOLN
4.0000 mg | Freq: Once | INTRAMUSCULAR | Status: AC
  Administered 2021-11-21: 4 mg via INTRAVENOUS
  Filled 2021-11-21: qty 2

## 2021-11-21 MED ORDER — PERFLUTREN LIPID MICROSPHERE
1.0000 mL | INTRAVENOUS | Status: AC | PRN
  Administered 2021-11-21: 4 mL via INTRAVENOUS
  Filled 2021-11-21: qty 10

## 2021-11-21 MED ORDER — CARVEDILOL 3.125 MG PO TABS
3.1250 mg | ORAL_TABLET | Freq: Two times a day (BID) | ORAL | Status: DC
  Administered 2021-11-21 – 2021-11-22 (×2): 3.125 mg via ORAL
  Filled 2021-11-21 (×2): qty 1

## 2021-11-21 MED ORDER — SODIUM CHLORIDE 0.9 % WEIGHT BASED INFUSION: 1.0000 mL/kg/h | INTRAVENOUS | Status: DC

## 2021-11-21 MED ORDER — VERAPAMIL HCL 2.5 MG/ML IV SOLN
INTRAVENOUS | Status: AC
  Filled 2021-11-21: qty 2

## 2021-11-21 MED ORDER — SODIUM CHLORIDE (HYPERTONIC) 5 % OP SOLN
1.0000 [drp] | Freq: Every day | OPHTHALMIC | Status: DC
  Administered 2021-11-21 – 2021-11-26 (×6): 1 [drp] via OPHTHALMIC
  Filled 2021-11-21 (×2): qty 15

## 2021-11-21 MED ORDER — SODIUM CHLORIDE 0.9 % IV SOLN: INTRAVENOUS | Status: AC

## 2021-11-21 MED ORDER — CHLORHEXIDINE GLUCONATE CLOTH 2 % EX PADS
6.0000 | MEDICATED_PAD | Freq: Every day | CUTANEOUS | Status: DC
  Administered 2021-11-21 – 2021-11-24 (×4): 6 via TOPICAL

## 2021-11-21 MED ORDER — MONTELUKAST SODIUM 10 MG PO TABS
10.0000 mg | ORAL_TABLET | Freq: Every day | ORAL | Status: DC
Start: 2021-11-21 — End: 2021-11-27
  Administered 2021-11-21 – 2021-11-26 (×6): 10 mg via ORAL
  Filled 2021-11-21 (×6): qty 1

## 2021-11-21 MED ORDER — LATANOPROST 0.005 % OP SOLN
1.0000 [drp] | Freq: Every day | OPHTHALMIC | Status: DC
  Administered 2021-11-21 – 2021-11-26 (×6): 1 [drp] via OPHTHALMIC
  Filled 2021-11-21 (×2): qty 2.5

## 2021-11-21 MED ORDER — HEPARIN (PORCINE) IN NACL 1000-0.9 UT/500ML-% IV SOLN
INTRAVENOUS | Status: AC
  Filled 2021-11-21: qty 500

## 2021-11-21 MED ORDER — PANTOPRAZOLE SODIUM 40 MG PO TBEC
40.0000 mg | DELAYED_RELEASE_TABLET | Freq: Two times a day (BID) | ORAL | Status: DC
  Administered 2021-11-21 – 2021-11-27 (×13): 40 mg via ORAL
  Filled 2021-11-21 (×13): qty 1

## 2021-11-21 MED ORDER — INSULIN ASPART PROT & ASPART (70-30 MIX) 100 UNIT/ML ~~LOC~~ SUSP
40.0000 [IU] | Freq: Two times a day (BID) | SUBCUTANEOUS | Status: DC
  Administered 2021-11-21 – 2021-11-27 (×11): 40 [IU] via SUBCUTANEOUS
  Filled 2021-11-21 (×2): qty 10

## 2021-11-21 SURGICAL SUPPLY — 30 items
BALLN EMERGE MR 2.0X8 (BALLOONS) ×3
BALLN SAPPHIRE 2.5X15 (BALLOONS) ×3
BALLN SAPPHIRE ~~LOC~~ 2.0X8 (BALLOONS) ×2 IMPLANT
BALLN SAPPHIRE ~~LOC~~ 3.5X12 (BALLOONS) ×2 IMPLANT
BALLN ~~LOC~~ EMERGE MR 2.5X12 (BALLOONS) ×3
BALLOON EMERGE MR 2.0X8 (BALLOONS) IMPLANT
BALLOON SAPPHIRE 2.5X15 (BALLOONS) IMPLANT
BALLOON ~~LOC~~ EMERGE MR 2.5X12 (BALLOONS) IMPLANT
CATH INFINITI 5 FR JL3.5 (CATHETERS) ×2 IMPLANT
CATH OPTICROSS HD (CATHETERS) ×2 IMPLANT
CATH VISTA GUIDE 6FR JR4 (CATHETERS) ×2 IMPLANT
DEVICE RAD COMP TR BAND LRG (VASCULAR PRODUCTS) ×2 IMPLANT
ELECT DEFIB PAD ADLT CADENCE (PAD) ×2 IMPLANT
GLIDESHEATH SLEND A-KIT 6F 22G (SHEATH) ×2 IMPLANT
GUIDEWIRE INQWIRE 1.5J.035X260 (WIRE) IMPLANT
INQWIRE 1.5J .035X260CM (WIRE) ×3
KIT ENCORE 26 ADVANTAGE (KITS) ×4 IMPLANT
KIT HEART LEFT (KITS) ×4 IMPLANT
MAT PREVALON FULL STRYKER (MISCELLANEOUS) ×2 IMPLANT
PACK CARDIAC CATHETERIZATION (CUSTOM PROCEDURE TRAY) ×4 IMPLANT
SHEATH PROBE COVER 6X72 (BAG) ×2 IMPLANT
SLED PULL BACK IVUS (MISCELLANEOUS) ×2 IMPLANT
STENT ONYX FRONTIER 2.5X18 (Permanent Stent) ×2 IMPLANT
STENT ONYX FRONTIER 3.0X18 (Permanent Stent) ×2 IMPLANT
TRANSDUCER W/STOPCOCK (MISCELLANEOUS) ×4 IMPLANT
TUBING CIL FLEX 10 FLL-RA (TUBING) ×4 IMPLANT
VALVE COPILOT STAT (MISCELLANEOUS) ×2 IMPLANT
WIRE ASAHI FIELDER XT 190CM (WIRE) ×2 IMPLANT
WIRE ASAHI PROWATER 180CM (WIRE) ×2 IMPLANT
WIRE HI TORQ BMW 190CM (WIRE) ×4 IMPLANT

## 2021-11-21 NOTE — H&P (Signed)
[place]   PATIENT NAME: Randy Liu    MR#:  [id number]  DATE OF BIRTH:  October 09, 1950  DATE OF ADMISSION:  11/20/2021  PRIMARY CARE PHYSICIAN: [name], MD   Patient is coming from: Home  REQUESTING/REFERRING PHYSICIAN: Orpah Greek, MD  CHIEF COMPLAINT:   Chief Complaint  Patient presents with   Chest Pain    8/10 central chest pain started tonight.     HISTORY OF PRESENT ILLNESS:  Randy Liu is a 71 y.o. male with medical history significant for type 2 diabetes mellitus, diastolic CHF, hypertension, coronary artery disease and history of PE on Eliquis, who presented to the ER with acute onset of intermittent midsternal chest pain graded 7-8/10 in severity felt as a sharp pain with associated nausea and vomiting.  He had similar episode on Wednesday night that was relieved with sublingual nitroglycerin.  Last night it occurred around 7:30 PM.  He has chronic cough and he denied any dyspnea or wheezing or diaphoresis with this pain.  The patient was given sublingual nitroglycerin by EMS as well as aspirin and fluids.  He was later on pain-free.    ED Course: When he came to the ER, heart rate was 107 with otherwise normal vital signs.  Labs revealed mild leukocytosis 11.1 with neutrophilia.  Influenza antigens and COVID-19 PCR came back negative.  High-sensitivity troponin I was 123 and later 887.  CMP revealed mild hyponatremia 134 and a CO2 was 21 with a blood glucose of 236 and creatinine 1.51 close to his baseline with stage IIIa chronic kidney disease and total bili 1.5 with total protein 6.3 albumin of 3.   EKG as reviewed by me : .EKG showed sinus tachycardia with a rate of 108 with incomplete right bundle branch block and left anterior fascicular block, poor R wave progressio and Q waves inferiorly.n. Imaging: Chest x-ray showed stable enlargement of the cardiomediastinal silhouette with no acute cardiopulmonary disease.  The patient was  given a GI cocktail, IV heparin bolus and drip, sublingual nitroglycerin and 4 mg of IV Zofran.  He will be admitted to a progressive unit bed for further evaluation and management. PAST MEDICAL HISTORY:   Past Medical History:  Diagnosis Date   Asthma    CHF (congestive heart failure) (HCC)    Diabetes mellitus without complication ([place])    Hypertension    Sleep apnea   -History pulmonary embolism on anticoagulation with Eliquis. -Coronary artery disease PAST SURGICAL HISTORY:   Past Surgical History:  Procedure Laterality Date   BIOPSY  12/14/2020   Procedure: BIOPSY;  Surgeon: Jackquline Denmark, MD;  Location: [facility];  Service: Endoscopy;;   BIOPSY  09/05/2021   Procedure: BIOPSY;  Surgeon: Irving Copas., MD;  Location: [facility];  Service: Gastroenterology;;   CHOLECYSTECTOMY N/A 12/17/2020   Procedure: LAPAROSCOPIC CHOLECYSTECTOMY, LIVER BIOPSY, PRIMARY UMBILICAL HERNIA REPAIR;  Surgeon: Armandina Gemma, MD;  Location: [facility];  Service: General;  Laterality: N/A;   COLONOSCOPY WITH PROPOFOL N/A 05/08/2021   Procedure: COLONOSCOPY WITH PROPOFOL;  Surgeon: Carol Ada, MD;  Location: [facility];  Service: Endoscopy;  Laterality: N/A;   ENDOSCOPIC RETROGRADE CHOLANGIOPANCREATOGRAPHY (ERCP) WITH PROPOFOL N/A 12/14/2020   Procedure: ENDOSCOPIC RETROGRADE CHOLANGIOPANCREATOGRAPHY (ERCP) WITH PROPOFOL;  Surgeon: Jackquline Denmark, MD;  Location: [facility];  Service: Endoscopy;  Laterality: N/A;   ESOPHAGOGASTRODUODENOSCOPY N/A 09/05/2021   Procedure: ESOPHAGOGASTRODUODENOSCOPY (EGD);  Surgeon: Irving Copas., MD;  Location: [facility];  Service: Gastroenterology;  Laterality: N/A;  HEMOSTASIS CLIP PLACEMENT  09/05/2021   Procedure: HEMOSTASIS CLIP PLACEMENT;  Surgeon: Irving Copas., MD;  Location: [facility];  Service: Gastroenterology;;   LEFT HEART CATH AND CORONARY ANGIOGRAPHY N/A 08/12/2021   Procedure: LEFT HEART CATH AND CORONARY ANGIOGRAPHY;   Surgeon: Adrian Prows, MD;  Location: [facility];  Service: Cardiovascular;  Laterality: N/A;   POLYPECTOMY  05/08/2021   Procedure: POLYPECTOMY;  Surgeon: Carol Ada, MD;  Location: [facility];  Service: Endoscopy;;   POLYPECTOMY  09/05/2021   Procedure: POLYPECTOMY;  Surgeon: Irving Copas., MD;  Location: [facility];  Service: Gastroenterology;;   REMOVAL OF STONES  12/14/2020   Procedure: REMOVAL OF STONES;  Surgeon: Jackquline Denmark, MD;  [facility];  Service: Endoscopy;;   SPHINCTEROTOMY  12/14/2020   Procedure: Joan Mayans;  Surgeon: Jackquline Denmark, MD;  Location: [facility];  Service: Endoscopy;;    SOCIAL HISTORY:   Social History   Tobacco Use   Smoking status: Former   Smokeless tobacco: Never  Substance Use Topics   Alcohol use: Not Currently    FAMILY HISTORY:   Family History  Problem Relation Age of Onset   Heart failure Mother    Heart disease Mother    Cancer Father     DRUG ALLERGIES:   Allergies  Allergen Reactions   Metoclopramide Itching    Loss of Balance; Urinary Incontinence   Nsaids     Other reaction(s): Other (See Comments) Renal Insufficiency Kidney issues    Amoxicillin-Pot Clavulanate Diarrhea and Nausea And Vomiting    Other reaction(s): Other (See Comments) Abdomen Pain Abdomen pain    Statins     Other reaction(s): Other (See Comments) Myalgias and Myositis myalgias    Allopurinol Rash   Empagliflozin     Other reaction(s): Other (See Comments) Fainting, weakness, lightheaded, falling   Tiotropium     Other reaction(s): Other (See Comments) Urinary retention    REVIEW OF SYSTEMS:   ROS As per history of present illness. All pertinent systems were reviewed above. Constitutional, HEENT, cardiovascular, respiratory, GI, GU, musculoskeletal, neuro, psychiatric, endocrine, integumentary and hematologic systems were reviewed and are otherwise negative/unremarkable except for positive findings  mentioned above in the HPI.   MEDICATIONS AT HOME:   Prior to Admission medications   Medication Sig Start Date End Date Taking? Authorizing Provider  acetaminophen (TYLENOL) 500 MG tablet Take 1,000 mg by mouth every 8 (eight) hours as needed for moderate pain.   Yes [provider]  Alpha-Lipoic Acid 300 MG TABS Take 600 mg by mouth daily.   Yes [provider]  aspirin EC 81 MG tablet Take 81 mg by mouth daily. Swallow whole.   Yes [provider]  benzonatate (TESSALON) 200 MG capsule Take 200 mg by mouth every 8 (eight) hours as needed for cough. 11/18/20  Yes [provider]  bisacodyl (DULCOLAX) 10 MG suppository Place 1 suppository (10 mg total) rectally daily as needed for up to 10 doses for moderate constipation. 05/06/21  Yes Antonieta Pert, MD  carvedilol (COREG) 3.125 MG tablet TAKE ONE TABLET BY MOUTH TWICE A DAY WITH MEALS Patient taking differently: Take 3.125 mg by mouth 2 (two) times daily with a meal. 10/02/21  Yes Cantwell, Celeste C, PA-C  Cholecalciferol 25 MCG (1000 UT) capsule Take 4,000 Units by mouth daily.   Yes [provider]  colchicine-probenecid 0.5-500 MG tablet Take 1 tablet by mouth daily. 11/21/20  Yes [provider]  cyanocobalamin (,VITAMIN B-12,) 1000 MCG/ML  injection Inject 1,000 mcg into the skin every 30 (thirty) days. 09/23/20  Yes [provider]  ELIQUIS 5 MG TABS tablet Take 1 tablet (5 mg total) by mouth 2 (two) times daily. Resume on 05/11/21 Patient taking differently: Take 5 mg by mouth 2 (two) times daily. 05/11/21  Yes Kc, Maren [place], MD  Evolocumab (REPATHA SURECLICK) XX123456 MG/ML SOAJ Inject 1 mL into the skin every 14 (fourteen) days. 08/27/21  Yes Adrian Prows, MD  fenofibrate 160 MG tablet Take 160 mg by mouth daily. 12/04/20  Yes [provider]  fluticasone (FLONASE) 50 MCG/ACT nasal spray Place 1 spray into both nostrils daily. 07/08/21  Yes [provider]  latanoprost  (XALATAN) 0.005 % ophthalmic solution Place 1 drop into the left eye at bedtime. 10/29/20  Yes [provider]  losartan (COZAAR) 25 MG tablet Take 1 tablet (25 mg total) by mouth daily. 08/20/21 12/18/21 Yes Cantwell, Celeste C, PA-C  magnesium oxide (MAG-OX) 400 MG tablet Take 400 mg by mouth 2 (two) times daily. 10/09/20  Yes [provider]  meclizine (ANTIVERT) 25 MG tablet Take 25 mg by mouth 3 (three) times daily as needed for nausea.   Yes [provider]  metoCLOPramide (REGLAN) 5 MG tablet Take 2.5 mg by mouth at bedtime. 10/09/20  Yes [provider]  montelukast (SINGULAIR) 10 MG tablet Take 10 mg by mouth at bedtime. 09/28/20  Yes [provider]  nitroGLYCERIN (NITROSTAT) 0.4 MG SL tablet Place 0.4 mg under the tongue every 5 (five) minutes as needed for chest pain.   Yes [provider]  NOVOLOG MIX 70/30 FLEXPEN (70-30) 100 UNIT/ML FlexPen Inject 30 Units into the skin 2 (two) times daily with a meal. Patient taking differently: Inject 40-85 Units into the skin 2 (two) times daily with a meal. Sliding scale 05/09/21  Yes Kc, Ramesh, MD  ondansetron (ZOFRAN) 8 MG tablet Take 8 mg by mouth every 8 (eight) hours as needed for nausea or vomiting.   Yes [provider]  ondansetron (ZOFRAN-ODT) 4 MG disintegrating tablet Take 1 tablet (4 mg total) by mouth every 8 (eight) hours as needed for nausea or vomiting. 09/05/21  Yes Sharen Hones, MD  pantoprazole (PROTONIX) 40 MG tablet Take 1 tablet (40 mg total) by mouth 2 (two) times daily. 09/05/21  Yes Sharen Hones, MD  Polyethylene Glycol 400 (BLINK TEARS) 0.25 % SOLN Place 1 drop into both eyes daily as needed (dry eyes).   Yes [provider]  PROAIR RESPICLICK 123XX123 (90 Base) MCG/ACT AEPB Inhale 2 puffs into the lungs every 6 (six) hours as needed (sob/wheezing). 04/03/21  Yes [provider]  Probiotic Product (ALIGN) 4 MG CAPS Take 4 mg by mouth daily.   Yes [provider]  ranolazine (RANEXA) 500 MG 12 hr tablet TAKE ONE TABLET BY MOUTH TWICE DAILY Patient taking differently: Take 500 mg by mouth 2 (two) times daily. 10/02/21  Yes Cantwell, Celeste C, PA-C  senna-docusate (SENOKOT-S) 8.6-50 MG tablet Take 1 tablet by mouth daily.   Yes [provider]  sodium chloride (MURO 128) 5 % ophthalmic ointment Place 1 application into both eyes at bedtime.   Yes [provider]  sucralfate (CARAFATE) 1 GM/10ML suspension Take 10 mLs (1 g total) by mouth 2 (two) times daily. 09/05/21  Yes Sharen Hones, MD  VASCEPA 1 g capsule Take 1 g by mouth 2 (two) times daily. 08/13/21  Yes [provider]  guaiFENesin ([place]) 600 MG 12  hr tablet Take 600 mg by mouth 2 (two) times daily as needed for cough. Patient not taking: Reported on 11/21/2021    [provider]  polyethylene glycol (MIRALAX / GLYCOLAX) 17 g packet Take 17 g by mouth daily. Patient not taking: Reported on 11/21/2021 09/05/21   Sharen Hones, MD  simvastatin (ZOCOR) 10 MG tablet Take 1 tablet (10 mg total) by mouth daily at 6 PM. Patient not taking: Reported on 11/21/2021 09/19/21 11/21/21  Cantwell, Anderson [place] C, PA-C      VITAL SIGNS:  Blood pressure 112/69, pulse 96, temperature 97.6 F (36.4 C), temperature source Oral, resp. rate 17, height 6\' 1"  (1.854 m), weight 115.8 kg, SpO2 96 %.  PHYSICAL EXAMINATION:  Physical Exam  GENERAL:  71 y.o.-year-old patient lying in the bed with no acute distress.  EYES: Pupils equal, round, reactive to light and accommodation. No scleral icterus. Extraocular muscles intact.  HEENT: Head atraumatic, normocephalic. Oropharynx and nasopharynx clear.  NECK:  Supple, no jugular venous distention. No thyroid enlargement, no tenderness.  LUNGS: Normal breath sounds bilaterally, no wheezing, rales,rhonchi or crepitation. No use of accessory muscles of respiration.  CARDIOVASCULAR: Regular rate and rhythm, S1, S2 normal. No murmurs,  rubs, or gallops.  ABDOMEN: Soft, nondistended, nontender. Bowel sounds present. No organomegaly or mass.  EXTREMITIES: No pedal edema, cyanosis, or clubbing.  NEUROLOGIC: Cranial nerves II through XII are intact. Muscle strength 5/5 in all extremities. Sensation intact. Gait not checked.  PSYCHIATRIC: The patient is alert and oriented x 3.  Normal affect and good eye contact. SKIN: No obvious rash, lesion, or ulcer.   LABORATORY PANEL:   CBC Recent Labs  Lab 11/20/21 2207  WBC 11.1*  HGB 13.5  HCT 40.6  PLT 271   ------------------------------------------------------------------------------------------------------------------  Chemistries  Recent Labs  Lab 11/20/21 2207  NA 134*  K 4.8  CL 100  CO2 21*  GLUCOSE 236*  BUN 21  CREATININE 1.51*  CALCIUM 9.1  AST 19  ALT 12  ALKPHOS 37*  BILITOT 1.5*   ------------------------------------------------------------------------------------------------------------------  Cardiac Enzymes No results for input(s): TROPONINI in the last 168 hours. ------------------------------------------------------------------------------------------------------------------  RADIOLOGY:  CT ABDOMEN PELVIS W CONTRAST  Result Date: 11/21/2021 CLINICAL DATA:  Diffuse abdominal pain. Concern for bowel obstruction. EXAM: CT ABDOMEN AND PELVIS WITH CONTRAST TECHNIQUE: Multidetector CT imaging of the abdomen and pelvis was performed using the standard protocol following bolus administration of intravenous contrast. RADIATION DOSE REDUCTION: This exam was performed according to the departmental dose-optimization program which includes automated exposure control, adjustment of the mA and/or kV according to patient size and/or use of iterative reconstruction technique. CONTRAST:  174mL OMNIPAQUE IOHEXOL 300 MG/ML  SOLN COMPARISON:  There are multiple prior CTs, dating back as far as 2015. The 2 most recent are CTs with contrast 08/31/2021 and 08/09/2021.  FINDINGS: Lower chest: Posterior basal linear atelectasis or scarring appears similar. The lung bases are clear of infiltrate. The cardiac size is normal. Small pericardial effusion is unchanged. Hepatobiliary: 19 cm length mildly steatotic liver without mass enhancement. The gallbladder is absent without biliary dilatation. Pancreas: 11.4 mm hypodense lesion is again seen in the uncinate process of the pancreas, stable over the 2 most recent prior studies but was not seen on CT exam from 06/10/2019 but was present and seems unchanged compared with the earliest study from last year on 12/12/2020. Follow-up pancreatic MRI recommendation is reiterated. Pancreas fatty replaced, atrophic and otherwise unremarkable. Spleen: No mass enhancement or splenomegaly. Adrenals/Urinary Tract: There is no  adrenal mass no mass enhancement of the kidneys. There is stable 8.2 cm cyst arising from the lower lateral left kidney. Stable 1.9 cm cyst lower lateral right kidney. There is no urinary stone or obstruction no focal bladder abnormality. Stomach/Bowel: Gastric wall is unremarkable. There are hemostatic clips in the descending duodenum new from the last CT , date of procedure 09/05/2021. Additional hemostatic clip descending colon. There are undigested medication tablets in the right-sided colon and in the rectum . There is no small bowel obstruction or inflammation. The appendix is normal. Left colonic diverticula without diverticulitis are present as before. There is moderate retained stool. The rectum is distended with dense stool. Posterior perirectal stranding could be dependent congestive change or indicate a mild stercoral proctitis. Vascular/Lymphatic: Aortic atherosclerosis. No enlarged abdominal or pelvic lymph nodes. Reproductive: Mild prostatomegaly. Other: There are small inguinal fat hernias larger on the left. There is no incarcerated hernia. There is no free air, hemorrhage or fluid. Musculoskeletal: There is  facet hypertrophy in the lumbar spine. Early hip DJD. IMPRESSION: 1. Moderate constipation, including with rectum moderately distended with dense stool. Posterior perirectal stranding has developed, new from the prior studies, and could be dependent pelvic congestive change or due to a mild stercoral proctitis. There is no evidence perforation or abscess. 2. No other evidence of acute abdominopelvic process. 3. 11.4 mm low-density lesion of the uncinate process of the pancreas. Although stable over last year's studies was not present in 2020. Pancreatic MRI recommendation is reiterated, unless this was already done elsewhere between the last CT and now. 4. Chronic pericardial effusion. 5. Renal cysts. 6. Additional chronic findings. Electronically Signed   By: Almira Bar M.D.   On: 11/21/2021 01:44   DG Chest Port 1 View  Result Date: 11/20/2021 CLINICAL DATA:  Chest pain. EXAM: PORTABLE CHEST 1 VIEW COMPARISON:  Chest x-ray 08/31/2021 FINDINGS: The cardiomediastinal silhouette is enlarged unchanged. There is no lung consolidation, pleural effusion or pneumothorax identified 3 cardiomediastinal silhouette is within normal limits. IMPRESSION: No acute cardiopulmonary process. Stable enlargement of the cardiomediastinal silhouette. Electronically Signed   By: Darliss Cheney M.D.   On: 11/20/2021 21:58      IMPRESSION AND PLAN:  Assessment and Plan: * NSTEMI (non-ST elevated myocardial infarction) [facility]) - The patient will be admitted to a PCU bed. - We will continue him on IV heparin.  He does have a history of coronary artery disease. - We will continue him on IV nitroglycerin drip and as needed IV morphine sulfate. - The patient be continued on beta-blocker therapy. - We will continue him on aspirin. - We will continue Ranexa.  He is intolerant to statin therapy. - 2D echo and cardiology consult will be obtained. - Dr. Rosemary Holms was notified about the patient and is aware.   Dyslipidemia - We  will continue fenofibrate and Vascepa.  Hyponatremia He will be hydrated with IV normal saline and will follow his sodium level.  Type 2 diabetes mellitus with hyperlipidemia (HCC) - We will place him on supplement coverage with NovoLog and we will continue his basal coverage.  Essential hypertension We will continue his beta-blocker therapy as well as ARB.  Asthma We will continue his albuterol nebulizer.  History of pulmonary embolism - Eliquis will be replaced with IV heparin for now.     DVT prophylaxis: IV heparin. Advanced Care Planning:  Code Status: full code. Family Communication:  The plan of care was discussed in details with the patient (and family). I  answered all questions. The patient agreed to proceed with the above mentioned plan. Further management will depend upon hospital course. Disposition Plan: Back to previous home environment Consults called: Cardiology with [facility] cardiology. All the records are reviewed and case discussed with ED provider.  Status is: Inpatient  At the time of the admission, it appears that the appropriate admission status for this patient is inpatient.  This is judged to be reasonable and necessary in order to provide the required intensity of service to ensure the patient's safety given the presenting symptoms, physical exam findings and initial radiographic and laboratory data in the context of comorbid conditions.  The patient requires inpatient status due to high intensity of service, high risk of further deterioration and high frequency of surveillance required.  I certify that at the time of admission, it is my clinical judgment that the patient will require inpatient hospital care extending more than 2 midnights.                            Dispo: The patient is from: Home              Anticipated d/c is to: Home              Patient currently is not medically stable to d/c.              Difficult to place patient: No  Christel Mormon M.D on 11/21/2021 at 5:42 AM  [facility]   From 7 PM-7 AM, contact night-coverage [email or web address]  CC: Primary care physician; [name], MD

## 2021-11-21 NOTE — Assessment & Plan Note (Addendum)
Continue albuterol nebulizer and Singulair.

## 2021-11-21 NOTE — Assessment & Plan Note (Addendum)
Not on beta blocker at this time.  ARB on hold.  Borderline low blood pressure.

## 2021-11-21 NOTE — ED Notes (Signed)
Gave report to [employer]. Questions answered. ?

## 2021-11-21 NOTE — Consult Note (Signed)
CARDIOLOGY CONSULT NOTE  Patient ID: Adhvaith Thousand MRN: [id number] DOB/AGE: [birth year] 71 y.o.  Admit date: 11/20/2021 Referring Physician: [facility] hospitalist Reason for Consultation:  NSTEMI  HPI:   71 y.o. 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  Patient has been having off-and-on chest pain, associated with nausea for last 10 days, with worsening overnight.  He was brought to [facility] emergency room, where he had at least 1 episode of night responsive chest pain.  Troponins were elevated and rising up to 800.  Chest pain currently controlled on IV nitroglycerin, with low normal blood pressures.  Patient was originally diagnosed with severe multivessel coronary artery disease in 07/2021 after hospitalization with non-STEMI.  While CABG would be ideal revascularization option, he was deemed to be not a surgical candidate due to his limiting comorbidities, and bedbound status with poor functional status.  For the same reasons, he was recommended medical management alone.  In addition, patient also was found to have nonbleeding gastric ulcers, and underwent clipping for a duodenal polyp, fortunately with no anemia.  Patient's recurrent hospitalizations with nausea and troponin elevation have been attributed to his gastroparesis and demand ischemia. However, patient's hospitalization overnight was with not responsive chest pain and rising troponins as described above.  Patient underwent CT abdomen with contrast given his abdominal pain, along with chest pain.  There was no acute abnormality found, but he was again found to have a 11.4 mm  low-density lesion in the uncinate process of pancreas.  This was noted to be stable over last year studies, but was not present in 2020.  Pancreatic MRI was again recommended.  Past Medical History:  Diagnosis Date   Asthma    CHF (congestive heart failure) ([place])    Diabetes mellitus without complication ([place])    Hypertension    Sleep apnea      Past Surgical History:  Procedure Laterality Date   BIOPSY  12/14/2020   Procedure: BIOPSY;  Surgeon: Jackquline Denmark, MD;  Location: [facility];  Service: Endoscopy;;   BIOPSY  09/05/2021   Procedure: BIOPSY;  Surgeon: Irving Copas., MD;  Location: [place];  Service: Gastroenterology;;   CHOLECYSTECTOMY N/A 12/17/2020   Procedure: LAPAROSCOPIC CHOLECYSTECTOMY, LIVER BIOPSY, PRIMARY UMBILICAL HERNIA REPAIR;  Surgeon: Armandina Gemma, MD;  Location: [facility];  Service: General;  Laterality: N/A;   COLONOSCOPY WITH PROPOFOL N/A 05/08/2021   Procedure: COLONOSCOPY WITH PROPOFOL;  Surgeon: Carol Ada, MD;  Location: [facility];  Service: Endoscopy;  Laterality: N/A;   ENDOSCOPIC RETROGRADE CHOLANGIOPANCREATOGRAPHY (ERCP) WITH PROPOFOL N/A 12/14/2020   Procedure: ENDOSCOPIC RETROGRADE CHOLANGIOPANCREATOGRAPHY (ERCP) WITH PROPOFOL;  Surgeon: Jackquline Denmark, MD;  [facility]: [facility];  Service: Endoscopy;  Laterality: N/A;   ESOPHAGOGASTRODUODENOSCOPY N/A 09/05/2021   Procedure: ESOPHAGOGASTRODUODENOSCOPY (EGD);  Surgeon: Irving Copas., MD;  Location: [place];  Service: Gastroenterology;  Laterality: N/A;   HEMOSTASIS CLIP PLACEMENT  09/05/2021   Procedure: HEMOSTASIS CLIP PLACEMENT;  Surgeon: Irving Copas., MD;  Location: [place];  Service: Gastroenterology;;   LEFT HEART CATH AND CORONARY ANGIOGRAPHY N/A 08/12/2021   Procedure: LEFT HEART CATH AND CORONARY ANGIOGRAPHY;  Surgeon: Adrian Prows, MD;  Location: [place] [facility];  Service: Cardiovascular;  Laterality: N/A;   POLYPECTOMY  05/08/2021  Procedure:  POLYPECTOMY;  Surgeon: Carol Ada, MD;  Location: [facility];  Service: Endoscopy;;   POLYPECTOMY  09/05/2021   Procedure: POLYPECTOMY;  Surgeon: Irving Copas., MD;  Location: [facility];  Service: Gastroenterology;;   REMOVAL OF STONES  12/14/2020   Procedure: REMOVAL OF STONES;  Surgeon: Jackquline Denmark, MD;  [facility]: [facility];  Service: Endoscopy;;   SPHINCTEROTOMY  12/14/2020   Procedure: Joan Mayans;  Surgeon: Jackquline Denmark, MD;  Location: [facility];  Service: Endoscopy;;      Family History  Problem Relation Age of Onset   Heart failure Mother    Heart disease Mother    Cancer Father      Social History: Social History   Socioeconomic History   Marital status: Married    Spouse name: Not on file   Number of children: 0   Years of education: Not on file   Highest education level: Not on file  Occupational History   Not on file  Tobacco Use   Smoking status: Former   Smokeless tobacco: Never  Vaping Use   Vaping Use: Never used  Substance and Sexual Activity   Alcohol use: Not Currently   Drug use: Never   Sexual activity: Not on file  Other Topics Concern   Not on file  Social History Narrative   Not on file   Social Determinants of Health   Financial Resource Strain: Not on file  Food Insecurity: Not on file  Transportation Needs: Not on file  Physical Activity: Not on file  Stress: Not on file  Social Connections: Not on file  Intimate Partner Violence: Not on file     Medications Prior to Admission  Medication Sig Dispense Refill Last Dose   acetaminophen (TYLENOL) 500 MG tablet Take 1,000 mg by mouth every 8 (eight) hours as needed for moderate pain.   11/20/2021   Alpha-Lipoic Acid 300 MG TABS Take 600 mg by mouth daily.   11/20/2021   aspirin EC 81 MG tablet Take 81 mg by mouth daily. Swallow whole.   11/20/2021   benzonatate (TESSALON) 200 MG capsule Take 200 mg by mouth every 8 (eight) hours as needed for cough.   Past Week    bisacodyl (DULCOLAX) 10 MG suppository Place 1 suppository (10 mg total) rectally daily as needed for up to 10 doses for moderate constipation. 10 suppository 0 Past Month   carvedilol (COREG) 3.125 MG tablet TAKE ONE TABLET BY MOUTH TWICE A DAY WITH MEALS (Patient taking differently: Take 3.125 mg by mouth 2 (two) times daily with a meal.) 180 tablet 1 11/20/2021 at 1800   Cholecalciferol 25 MCG (1000 UT) capsule Take 4,000 Units by mouth daily.   11/20/2021   colchicine-probenecid 0.5-500 MG tablet Take 1 tablet by mouth daily.   11/20/2021   cyanocobalamin (,VITAMIN B-12,) 1000 MCG/ML injection Inject 1,000 mcg into the skin every 30 (thirty) days.   11/10/2021   ELIQUIS 5 MG TABS tablet Take 1 tablet (5 mg total) by mouth 2 (two) times daily. Resume on 05/11/21 (Patient taking differently: Take 5 mg by mouth 2 (two) times daily.) 60 tablet  11/20/2021 at 1100   Evolocumab (REPATHA SURECLICK) [id number] MG/ML SOAJ Inject 1 mL into the skin every 14 (fourteen) days. 2 mL 3    fenofibrate 160 MG tablet Take 160 mg by mouth daily.   11/20/2021   fluticasone (FLONASE) 50 MCG/ACT nasal spray Place 1 spray into both nostrils daily.   Past Month   latanoprost (XALATAN)  0.005 % ophthalmic solution Place 1 drop into the left eye at bedtime.   Past Week   losartan (COZAAR) 25 MG tablet Take 1 tablet (25 mg total) by mouth daily. 30 tablet 3 11/20/2021   magnesium oxide (MAG-OX) 400 MG tablet Take 400 mg by mouth 2 (two) times daily.   11/20/2021   meclizine (ANTIVERT) 25 MG tablet Take 25 mg by mouth 3 (three) times daily as needed for nausea.   11/20/2021   metoCLOPramide (REGLAN) 5 MG tablet Take 2.5 mg by mouth at bedtime.   Past Week   montelukast (SINGULAIR) 10 MG tablet Take 10 mg by mouth at bedtime.   Past Week   nitroGLYCERIN (NITROSTAT) 0.4 MG SL tablet Place 0.4 mg under the tongue every 5 (five) minutes as needed for chest pain.   Past Week   NOVOLOG MIX 70/30 FLEXPEN (70-30) 100 UNIT/ML FlexPen Inject 30 Units into the  skin 2 (two) times daily with a meal. (Patient taking differently: Inject 40-85 Units into the skin 2 (two) times daily with a meal. Sliding scale) 15 mL 11 Past Week   ondansetron (ZOFRAN) 8 MG tablet Take 8 mg by mouth every 8 (eight) hours as needed for nausea or vomiting.   Past Month   ondansetron (ZOFRAN-ODT) 4 MG disintegrating tablet Take 1 tablet (4 mg total) by mouth every 8 (eight) hours as needed for nausea or vomiting. 20 tablet 0 Past Month   pantoprazole (PROTONIX) 40 MG tablet Take 1 tablet (40 mg total) by mouth 2 (two) times daily. 60 tablet 0 11/20/2021   Polyethylene Glycol 400 (BLINK TEARS) 0.25 % SOLN Place 1 drop into both eyes daily as needed (dry eyes).   Past Month   PROAIR RESPICLICK 123XX123 (90 Base) MCG/ACT AEPB Inhale 2 puffs into the lungs every 6 (six) hours as needed (sob/wheezing).   Past Month   Probiotic Product (ALIGN) 4 MG CAPS Take 4 mg by mouth daily.   11/20/2021   ranolazine (RANEXA) 500 MG 12 hr tablet TAKE ONE TABLET BY MOUTH TWICE DAILY (Patient taking differently: Take 500 mg by mouth 2 (two) times daily.) 180 tablet 1 11/20/2021   senna-docusate (SENOKOT-S) 8.6-50 MG tablet Take 1 tablet by mouth daily.   Past Week   sodium chloride (MURO 128) 5 % ophthalmic ointment Place 1 application into both eyes at bedtime.   Past Month   sucralfate (CARAFATE) 1 GM/10ML suspension Take 10 mLs (1 g total) by mouth 2 (two) times daily. 420 mL 0 11/20/2021   VASCEPA 1 g capsule Take 1 g by mouth 2 (two) times daily.   Past Week   guaiFENesin (MUCINEX) 600 MG 12 hr tablet Take 600 mg by mouth 2 (two) times daily as needed for cough. (Patient not taking: Reported on 11/21/2021)   Not Taking   polyethylene glycol (MIRALAX / GLYCOLAX) 17 g packet Take 17 g by mouth daily. (Patient not taking: Reported on 11/21/2021) 14 each 0 Not Taking   simvastatin (ZOCOR) 10 MG tablet Take 1 tablet (10 mg total) by mouth daily at 6 PM. (Patient not taking: Reported on 11/21/2021) 90 tablet 0 Not Taking     Review of Systems  Cardiovascular:  Positive for chest pain (Currently absent). Negative for dyspnea on exertion, leg swelling, palpitations and syncope.     Physical Exam: Physical Exam Vitals and nursing note reviewed.  Constitutional:      General: He is not in acute distress. Neck:     Vascular: No  JVD.  Cardiovascular:     Rate and Rhythm: Normal rate and regular rhythm.     Heart sounds: Normal heart sounds. No murmur heard. Pulmonary:     Effort: Pulmonary effort is normal.     Breath sounds: Normal breath sounds. No wheezing or rales.  Musculoskeletal:     Right lower leg: No edema.       Lab Results: Reviewed and interpreted:  Latest Reference Range & Units 11/20/21 22:07 11/20/21 23:05  B Natriuretic Peptide 0.0 - 100.0 pg/mL 101.0 (H)   Troponin I (High Sensitivity) <18 ng/L 123 (HH) 887 (HH)  ([place]): Data is critically high (H): Data is abnormally high  Imaging/tests reviewed and independently interpreted:  Left Heart Catheterization 08/12/21:  LV: 113/6, EDP 18 mmHg.  Ao 112/50, mean 75 mmHg.  No pressure gradient across the aortic valve. LM: Large vessel, 10 to 20% stenosis. LAD: Large vessel, it is occluded after giving origin to a small D1.  Mid to distal LAD is supplied by contralateral collaterals from the RCA.  D1 is diffusely diseased and is occluded in the mid to distal segment.  Mid to distal LAD appears to be widely patent with mild disease and a good target for bypass. RI: Small vessel, no significant disease. CX: Large vessel.  Gives origin to large OM1 which is a high-grade 95% proximal stenosis followed by 20 to 30% diffuse mid segment stenosis.  Distal vessel is a good target for bypass.  There is a large OM 2 that is ulcerated and occluded in the proximal and mid segment and appears to be diffusely diseased in the proximal and mid segment.  It is a large vessel and distal vessel appears to be a good target for bypass. RCA: Dominant, large  vessel.  Gives origin to 2 PDA branches, proximal PDA branch is smooth and normal.  After this distal RCA has a high-grade 90 to 95% bifurcation stenosis involving the PL branch.  Distal RCA gives excellent collaterals to the LAD.   Impression: Mild increase in LVEDP.  LV gram not performed to conserve contrast.  Multivessel disease, ideally speaking would benefit from CABG.  However patient is essentially bedbound and wheelchair-bound due to obesity, peripheral neuropathy and generalized deconditioning.  Discussed with wife, patient does not appear to be a good candidate for CABG as his recovery would be extremely complicated.  Best option is to proceed with multivessel PCI electively, in spite of two-vessel disease, EF appears to be preserved.  I would like him to recuperate from his acute renal failure.  I have discussed all the options with patient's wife who is in agreement and favors conservative therapy/PCI over surgical therapy.  50 mL contrast utilized. Patient presently on Eliquis for PE, I have started him on aspirin along with Plavix for now.   Echocardiogram 08/10/2021:  1. Left ventricular ejection fraction, by estimation, is 55 to 60%. The  left ventricle has normal function. Left ventricular endocardial border  not optimally defined to evaluate regional wall motion. Left ventricular  diastolic parameters are  indeterminate 2D images were obtain on 08/09/2021 LVEF 45-50% w/ RWMA  along the anterolateral and inferolateral walls. Definity images were  obtained 08/10/2021 which notes improvement in LVEF 55-60% without  regional wall motion abnormalities.   2. Right ventricular systolic function is normal. The right ventricular  size is normal. Mildly increased right ventricular wall thickness.   3. A small pericardial effusion is present. There is no evidence of  cardiac tamponade.  4. The mitral valve is normal in structure. Trivial mitral valve  regurgitation. No evidence of mitral  stenosis.   5. The aortic valve is tricuspid. Aortic valve regurgitation is not  visualized. No aortic stenosis is present.   6. The inferior vena cava is normal in size with <50% respiratory  variability, suggesting right atrial pressure of 8 mmHg.   CT abdomen w/contrast 11/21/2021: 1. Moderate constipation, including with rectum moderately distended with dense stool. Posterior perirectal stranding has developed, new from the prior studies, and could be dependent pelvic congestive change or due to a mild stercoral proctitis. There is no evidence perforation or abscess. 2. No other evidence of acute abdominopelvic process. 3. 11.4 mm low-density lesion of the uncinate process of the pancreas. Although stable over last year's studies was not present in 2020. Pancreatic MRI recommendation is reiterated, unless this was already done elsewhere between the last CT and now. 4. Chronic pericardial effusion. 5. Renal cysts. 6. Additional chronic findings.  Cardiac Studies:  Telemetry 11/21/2021: No arrhythmia  EKG 11/21/2021: Normal sinus rhythm Left anterior fascicular block Possible Anterolateral infarct , age undetermined Abnormal ECG When compared with   Assessment & Recommendations:  71 y.o. 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  Chest pain: Typical angina symptoms, responsive to nitroglycerin, with rising troponins up to 800. Findings suggestive of non-STEMI. Patient has had multiple recurrent hospitalizations with chest pain and elevated troponin, in the setting of known severe multivessel coronary artery  disease. Given his bedbound status and multiple comorbidities, he is not a surgical candidate. Thus far, medical management was recommended given his comorbidities, including nonbleeding gastric ulcers, renal dysfunction. However, at this point, I do not see any alternate option for adequately managing his obstructive coronary artery disease, other than revascularization. I explained to patient and wife in detail that it is not possible to achieve complete revascularization, given the nature of his multivessel disease, and risks of contrast-induced nephropathy, prolonged antiplatelet therapy, in the setting of known gastric ulcers. I proposed to perform straight strategy currently selective PCI to distal +/- mid RCA, and possibly staged PCI to OM1. I hope to achieve 1 month off aspirin, Plavix, Eliquis (on for h/o DVT), then stop aspirin and potentially even Plavix to minimize his bleeding risk in the setting of known nonbleeding ulcers without anemia. I made it clear to patient and his wife that proposed percutaneous coronary intervention will have no positive impact on his several multiple medical comorbidities.  Risks of procedure and antiplatelet therapy, including bleeding from GI ulcers, contrast induced nephropathy discussed in detail with patient and his wife. I also emphasized the importance of following up on pancreatic abnormality noted on CT scan.  Hopefully, we can get him through this non-STEMI hospitalization with revascularization, and follow-up with pancreatic MRI outpatient with his gastroenterologist. Continue Repatha, Vascepa, rest of the antianginal therapy. Continue management of diabetes as per primary team.  Management of rest of medical issues as per primary team.  Discussed interpretation of tests and management recommendations with the primary team  Time spent: 70 min    Nigel Mormon, MD Pager: [phone] Office: [phone]

## 2021-11-21 NOTE — Assessment & Plan Note (Addendum)
Hyperglycemia. On 70/30 and sliding scale insulin.  We will continue to monitor closely.  Continue diabetic diet.

## 2021-11-21 NOTE — Progress Notes (Signed)
Dr. Virgina Jock notified of wound to back that had bloody purulent drainage and foul odor coming from it. Order received for [facility] consult and to dress with gauze dressing for now. ?

## 2021-11-21 NOTE — Interval H&P Note (Signed)
History and Physical Interval Note: ? ?11/21/2021 ?11:00 AM ? ?Randy Liu  has presented today for surgery, with the diagnosis of nstemi.  The various methods of treatment have been discussed with the patient and family. After consideration of risks, benefits and other options for treatment, the patient has consented to  Procedure(s): ?LEFT HEART CATH AND CORONARY ANGIOGRAPHY (N/A) as a surgical intervention.  The patient's history has been reviewed, patient examined, no change in status, stable for surgery.  I have reviewed the patient's chart and labs.  Questions were answered to the patient's satisfaction.   ? ?2016 Appropriate Use Criteria for Coronary Revascularization in Patients With Acute Coronary Syndrome ?NSTEMI/UA ?High Risk (TIMI Score 5-7) ?NSTEMI/Unstable angina, stabilized patient at high risk ?Link Here: https://powell.info/ ?Indication: ? ?Revascularization by PCI or CABG of 1 or more arteries in a patient with NSTEMI or unstable angina with ?Stabilization after presentation ?High risk for clinical events ?A (7) Indication: 16; Score 7 ? ? ?Randy Liu ? ? ?

## 2021-11-21 NOTE — Assessment & Plan Note (Addendum)
Improved.  Latest sodium of 136

## 2021-11-21 NOTE — CV Procedure (Signed)
Complex RPDA and mid RCA PCI ?Intraprocedural hypotension requiring norepinephrine up to 30 mics, now off. ?Chest pain down to 2/10 from 10/10 during the procedure. ?EKG still shows ST elevation from no flow during the procedure. This will improve with time. ?Final angiogram shows excellent flow through RCA collateralizing LAD. ?Repeat EKG and re-evaluate in 30 min before transfer back to the floor. ?Full report to follow. ? ? ?Nigel Mormon, MD ?Pager: [phone] ?Office: [phone] ? ?

## 2021-11-21 NOTE — Progress Notes (Signed)
@  nd EKG done 38mins after 1st EKG per MD request.  Dr Jerilynn Mages. Patwardhan in to see patient and review 2nd EKG.  Pt CP remain a 2/10.  Order for Nitro IV to be resume at 18mcg.  MD will reassess later. ?

## 2021-11-21 NOTE — Assessment & Plan Note (Addendum)
Continue Eliquis.  He is also on aspirin and Plavix.  Tolerating well no mention of bleeding.

## 2021-11-21 NOTE — Progress Notes (Signed)
Patient underwent complex PCI to RPDA, RPL, and mid RCA.  Intraprocedural hypotension due to slow flow through RCA, which was also collateralizing LAD.  At the end of the procedure, we had excellent angiographic results with 0% residual stenosis at RPDA, R PLA, mid RCA, with TIMI-3 flow, collateralizing LAD all the way up to proximal LAD.  Nonflow-limiting linear dissection in proximal R PLA, with TIMI-3 flow seen. ? ?Intraprocedural hypotension, likely myocardial stunning, with persistent ST elevation at the end of the procedure, even after complete resolution of chest pain.  Patient had high blood pressure, 160/80 mmHg, with sinus tachycardia of 100-110 bpm.  I did give him metoprolol tartrate 25 mg p.o. dose.  However, after further evaluation, I hypothesized that the patient had acute heart failure due to myocardial stunning.  Therefore, I obtained stat echocardiogram that showed global hypokinesis, and mild RV systolic dysfunction as well.  LVEF 20-25%.  I do not have any preprocedure echocardiogram from today to compare with, so it is hard to say if all of this systolic dysfunction happen postprocedure or not.  His EF was near normal in 07/2021. ? ?Patient is not in shock, not in chest pain, and has normal blood pressures.  He has not made any urine output since the Cath Lab procedure.  Therefore, I requested Foley catheter to be placed, after which he put out about 300 cc of urine.  He did have some wheezing on lung exam.  Suspecting vascular congestion, I ordered a chest x-ray and gave IV Lasix 40 mg once. ? ?I anticipate that he will have some degree of acute kidney injury owing to intraprocedural hypotension, even with judicious use of 80 cc contrast for complex procedure. ? ?I recommend overnight ICU monitoring, with close emphasis on intake/output monitoring, hemodynamic assessment, pulmonary assessment to evaluate for any pulmonary edema requiring higher levels of respiratory support. ? ?I discussed  this at length with patient and his wife, who both agree with ICU monitoring.  Wife is very appreciative of efforts by entire team, and understands that Mr. Wesemann is critically ill.  Both her and I are optimistic that he will recover through this episode. ? ?At this point, I do not have any plans to perform PCI to OM1.  I think revascularization to his RCA, which collateralizes nearly entire LAD system, is best possible angiographic result which showed benefit him in the long run. ? ?CRITICAL CARE ?Performed by: Vernell Leep ?  ?Total critical care time: 120 minutes ?  ?Critical care time was exclusive of separately billable procedures and treating other patients. ?  ?Critical care was necessary to treat or prevent imminent or life-threatening deterioration. ?  ?Critical care was time spent personally by me on the following activities: development of treatment plan with patient and/or surrogate as well as nursing, discussions with consultants, evaluation of patient's response to treatment, examination of patient, obtaining history from patient or surrogate, ordering and performing treatments and interventions, ordering and review of laboratory studies, ordering and review of radiographic studies, pulse oximetry and re-evaluation of patient's condition. ?  ? ? ?Nigel Mormon, MD ?Pager: [phone] ?Office: [phone] ? ? ?

## 2021-11-21 NOTE — Assessment & Plan Note (Addendum)
Patient has history of multivessel coronary artery disease.  Patient underwent cardiac catheterization and PCI this admission on 11/21/2021-complex RPDA and mid RCA PCI followed by myocardial stunning.  Patient did have intraprocedural hypotension and required Levophed.  Was briefly in the ICU.  Cardiology followed the patient during hospitalization.  Continue aspirin Plavix Eliquis, fenofibrate Vascepa.  Ranexa has been stopped on discharge..  2D echocardiogram on 11/21/2021 showed ejection fraction of 20 to 25% compared to 2D echocardiogram from 08/10/2021 with EF of 55 to 60%.  Patient underwent repeat limited 2D echocardiogram on 11/25/2021 with improvement in LV ejection fraction to 40 to 45%.  Spoke with Dr.Tolia, Cardiology who recommended Toprol-XL 12.5 mg daily and spironolactone 12.5 mg daily.  Patient will need blood work prior to his next scheduled visit.  Spoke with the patient's wife about it as well.  Losartan and Coreg will be discontinued at this time.  Unable to put losartan due to borderline low blood pressure and renal failure.

## 2021-11-21 NOTE — Progress Notes (Signed)
ANTICOAGULATION CONSULT NOTE - Initial Consult ? ?Pharmacy Consult for Heparin (Apixaban on hold) ?Indication: chest pain/ACS, history of DVT/PE ? ?Allergies  ?Allergen Reactions  ? Metoclopramide Itching  ?  Loss of Balance; Urinary Incontinence  ? Nsaids   ?  Other reaction(s): Other (See Comments) ?Renal Insufficiency ?Kidney issues ?  ? Amoxicillin-Pot Clavulanate Diarrhea and Nausea And Vomiting  ?  Other reaction(s): Other (See Comments) ?Abdomen Pain ?Abdomen pain ?  ? Statins   ?  Other reaction(s): Other (See Comments) ?Myalgias and Myositis ?myalgias ?  ? Allopurinol Rash  ? Empagliflozin   ?  Other reaction(s): Other (See Comments) ?Fainting, weakness, lightheaded, falling  ? Tiotropium   ?  Other reaction(s): Other (See Comments) ?Urinary retention  ? ? ?Patient Measurements: ?Height: 6\' 1"  (185.4 cm) ?Weight: 119.3 kg (263 lb) ?IBW/kg (Calculated) : 79.9 ? ?Vital Signs: ?Temp: 98.1 ?F (36.7 ?C) (03/09 2137) ?Temp Source: Oral (03/09 2137) ?BP: 102/79 (03/10 0215) ?Pulse Rate: 104 (03/10 0215) ? ?Labs: ?Recent Labs  ?  11/20/21 ?2207 11/20/21 ?2305  ?HGB 13.5  --   ?HCT 40.6  --   ?PLT 271  --   ?CREATININE 1.51*  --   ?TROPONINIHS 123* 887*  ? ? ?Estimated Creatinine Clearance: 59.9 mL/min (A) (by C-G formula based on SCr of 1.51 mg/dL (H)). ? ? ?Medical History: ?Past Medical History:  ?Diagnosis Date  ? Asthma   ? CHF (congestive heart failure) ([place])   ? Diabetes mellitus without complication ([place])   ? Hypertension   ? Sleep apnea   ? ? ? ? ?Assessment: ?71 y/o M with chest pain and elevated trop to start heparin. On Apixaban PTA for history of DVT and PE. Last dose of Apixaban was 3/9 at 11:00 AM, ok to start heparin now. Anticipate using aPTT to dose for now.  ? ?Goal of Therapy:  ?Heparin level 0.3-0.7 units/ml ?aPTT 66-102 seconds ?Monitor platelets by anticoagulation protocol: Yes ?  ?Plan:  ?Start heparin drip at 1400 units/hr ?1200 heparin level and aPTT ?Daily CBC/Heparin level/aPTT ?Monitor  for bleeding ? ?Narda Bonds, PharmD, BCPS ?Clinical Pharmacist ?Phone: [phone] ? ? ? ?

## 2021-11-21 NOTE — Progress Notes (Addendum)
Patient seen and examined.  Admitted by nighttime hospitalist early morning hours with non-STEMI on heparin infusion.  Discussed with cardiology at the bedside.  Scheduled for cardiac cath today.  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  Troponin [id number].  Started on heparin and nitroglycerin, cardiology consulted and admitted to the hospital.  Patient was previously diagnosed with severe multivessel coronary artery disease, CABG is not an option due to comorbidities and bedbound status.  Also has significant constipation.  11.  For millimeter hypodense mass in the uncinate process of pancreas.  He has significant gastroparesis symptoms and was referred to [facility] and has an appointment on 3/20. ? ?Patient denied any complaints at this time.  Wife at the bedside.  Detailed discussion about comorbidities, risk of procedures, risk of bleeding with anticoagulation and antiplatelet therapies.  Patient is  scheduled for cardiac cath today.  We will start patient on scheduled laxative after the procedure.  Anticoagulation will be challenging.  Patient and wife understand. ? ? ?Total time spent: 30 minutes.  Same-day admission.  No charge visit. ? ?Patient developed myocardial stunning and low EF after the cardiac cath. Called by Dr Virgina Jock, he will transfer patient to ICU under his care . TRH will sign off. Plz call patient placement when he is able to come out of ICU.  ?

## 2021-11-21 NOTE — Assessment & Plan Note (Addendum)
Continue fenofibrate and Vascepa. ?

## 2021-11-21 NOTE — ED Provider Notes (Signed)
Signed out to me at shift change to follow-up on CT scan.  Patient was seen with chest pain, nausea and vomiting.  Patient had a first troponin that was elevated.  It was felt that the patient would require hospitalization but he does have extensive GI history including small bowel obstruction, CT scan was ordered before admission. ? ?Physical Exam  ?BP 102/79   Pulse (!) 104   Temp 98.1 ?F (36.7 ?C) (Oral)   Ht 6\' 1"  (1.854 m)   Wt 119.3 kg   SpO2 94%   BMI 34.70 kg/m?  ? ?Physical Exam ?Vitals and nursing note reviewed.  ?Constitutional:   ?   General: He is not in acute distress. ?   Appearance: He is well-developed.  ?HENT:  ?   Head: Normocephalic.  ?Eyes:  ?   Pupils: Pupils are equal, round, and reactive to light.  ?Pulmonary:  ?   Effort: Pulmonary effort is normal.  ?Musculoskeletal:  ?   Right lower leg: Edema present.  ?   Left lower leg: Edema present.  ?Skin: ?   General: Skin is warm and dry.  ?Neurological:  ?   Mental Status: He is alert.  ? ? ?Procedures  ?Marland KitchenCritical Care ?Performed by: Orpah Greek, MD ?Authorized by: Orpah Greek, MD  ? ?Critical care provider statement:  ?  Critical care time (minutes):  30 ?  Critical care was time spent personally by me on the following activities:  Development of treatment plan with patient or surrogate, discussions with consultants, evaluation of patient's response to treatment, examination of patient, ordering and review of laboratory studies, ordering and review of radiographic studies, ordering and performing treatments and interventions, pulse oximetry, re-evaluation of patient's condition, review of old charts and obtaining history from patient or surrogate ?  I assumed direction of critical care for this patient from another provider in my specialty: yes   ?  Care discussed with: admitting provider   ? ? ? ? ? ?ED Course / MDM  ?  ?Medical Decision Making ?Amount and/or Complexity of Data Reviewed ?Labs: ordered. ?Radiology:  ordered. ? ?Risk ?OTC drugs. ?Prescription drug management. ? ? ?CT scan to be performed at time of signout.  Ultimately, CT scan was performed and does not show small bowel obstruction or acute pathology. ? ?Patient did have recurrent chest pain.  Repeat EKG without ST elevations or significant change from prior.  Pain resolved after additional nitroglycerin sublingual. ? ?Discussed with Dr. Virgina Jock.  As patient is pain-free, recommends admit to hospitalist.  Initiate transition to heparin, IV nitro.  We will see patient in the morning to determine need for PCI. ? ?Discussed with Dr. Sidney Ace, hospitalist service. ? ? ? ? ?  ?Orpah Greek, MD ?11/21/21 0234 ? ?

## 2021-11-21 NOTE — Progress Notes (Signed)
?  Echocardiogram ?2D Echocardiogram has been performed. ? ?Randy Liu ?11/21/2021, 4:01 PM ?

## 2021-11-21 NOTE — Progress Notes (Signed)
ANTICOAGULATION CONSULT NOTE  ? ?Pharmacy Consult for Heparin (Apixaban on hold) ?Indication: chest pain/ACS, history of DVT/PE ? ?Allergies  ?Allergen Reactions  ? Metoclopramide Itching  ?  Loss of Balance; Urinary Incontinence  ? Nsaids   ?  Other reaction(s): Other (See Comments) ?Renal Insufficiency ?Kidney issues ?  ? Amoxicillin-Pot Clavulanate Diarrhea and Nausea And Vomiting  ?  Other reaction(s): Other (See Comments) ?Abdomen Pain ?Abdomen pain ?  ? Statins   ?  Other reaction(s): Other (See Comments) ?Myalgias and Myositis ?myalgias ?  ? Allopurinol Rash  ? Empagliflozin   ?  Other reaction(s): Other (See Comments) ?Fainting, weakness, lightheaded, falling  ? Tiotropium   ?  Other reaction(s): Other (See Comments) ?Urinary retention  ? ? ?Patient Measurements: ?Height: 6\' 1"  (185.4 cm) ?Weight: 115.8 kg (255 lb 4.7 oz) ?IBW/kg (Calculated) : 79.9 ? ?Vital Signs: ?Temp: 98.6 ?F (37 ?C) (03/10 0734) ?Temp Source: Axillary (03/10 0734) ?BP: 146/78 (03/10 1725) ?Pulse Rate: 86 (03/10 1725) ? ?Labs: ?Recent Labs  ?  11/20/21 ?2207 11/20/21 ?2305 11/21/21 ?[id number]  ?HGB 13.5  --  13.0  ?HCT 40.6  --  38.6*  ?PLT 271  --  222  ?CREATININE 1.51*  --  1.47*  ?TROPONINIHS 123* 887*  --   ? ? ? ?Estimated Creatinine Clearance: 60.6 mL/min (A) (by C-G formula based on SCr of 1.47 mg/dL (H)). ? ? ?Medical History: ?Past Medical History:  ?Diagnosis Date  ? Asthma   ? CHF (congestive heart failure) ([place])   ? Diabetes mellitus without complication ([place])   ? Hypertension   ? Sleep apnea   ? ? ? ? ?Assessment: ?71 y/o M with chest pain and elevated troponin. On Apixaban PTA for history of DVT and PE. Last dose of Apixaban was 3/9 at 11:00 AM. ? ?He is now s/p cath with RCA stents. Heparin to restart 8 hours post sheath removal (removed ~ 1:30pm) ? ? ?Goal of Therapy:  ?Heparin level 0.3-0.7 units/ml ?aPTT 66-102 seconds ?Monitor platelets by anticoagulation protocol: Yes ?  ?Plan:  ?Restart heparin drip 1400 units/hr at  9:30pm ?aPTT and heparin level in am with CBC ? ?Hildred Laser, PharmD ?Clinical Pharmacist ?**Pharmacist phone directory can now be found on [email or web address] ([email or web address]).  Listed under [place]. ? ? ? ? ? ? ?

## 2021-11-22 LAB — CBC
HCT: 39.8 % (ref 39.0–52.0)
Hemoglobin: 13.1 g/dL (ref 13.0–17.0)
MCH: 31.1 pg (ref 26.0–34.0)
MCHC: 32.9 g/dL (ref 30.0–36.0)
MCV: 94.5 fL (ref 80.0–100.0)
Platelets: 292 10*3/uL (ref 150–400)
RBC: 4.21 MIL/uL — ABNORMAL LOW (ref 4.22–5.81)
RDW: 15.9 % — ABNORMAL HIGH (ref 11.5–15.5)
WBC: 10.4 10*3/uL (ref 4.0–10.5)
nRBC: 0 % (ref 0.0–0.2)

## 2021-11-22 LAB — BASIC METABOLIC PANEL
Anion gap: 11 (ref 5–15)
BUN: 20 mg/dL (ref 8–23)
CO2: 25 mmol/L (ref 22–32)
Calcium: 9.2 mg/dL (ref 8.9–10.3)
Chloride: 99 mmol/L (ref 98–111)
Creatinine, Ser: 1.51 mg/dL — ABNORMAL HIGH (ref 0.61–1.24)
GFR, Estimated: 49 mL/min — ABNORMAL LOW (ref >60)
Glucose, Bld: 231 mg/dL — ABNORMAL HIGH (ref 70–99)
Potassium: 4.3 mmol/L (ref 3.5–5.1)
Sodium: 135 mmol/L (ref 135–145)

## 2021-11-22 LAB — GLUCOSE, CAPILLARY
Glucose-Capillary: 216 mg/dL — ABNORMAL HIGH (ref 70–99)
Glucose-Capillary: 208 mg/dL — ABNORMAL HIGH (ref 70–99)
Glucose-Capillary: 182 mg/dL — ABNORMAL HIGH (ref 70–99)
Glucose-Capillary: 145 mg/dL — ABNORMAL HIGH (ref 70–99)
Glucose-Capillary: 153 mg/dL — ABNORMAL HIGH (ref 70–99)

## 2021-11-22 LAB — HEPARIN LEVEL (UNFRACTIONATED): Heparin Unfractionated: >1.1 IU/mL — ABNORMAL HIGH (ref 0.30–0.70)

## 2021-11-22 LAB — APTT: aPTT: 56 seconds — ABNORMAL HIGH (ref 24–36)

## 2021-11-22 MED ORDER — FUROSEMIDE 10 MG/ML IJ SOLN
40.0000 mg | Freq: Every day | INTRAMUSCULAR | Status: DC
  Administered 2021-11-22 – 2021-11-23 (×2): 40 mg via INTRAVENOUS
  Filled 2021-11-22 (×2): qty 4

## 2021-11-22 MED ORDER — ASPIRIN EC 81 MG PO TBEC
81.0000 mg | DELAYED_RELEASE_TABLET | Freq: Every day | ORAL | Status: DC
  Administered 2021-11-22 – 2021-11-27 (×6): 81 mg via ORAL
  Filled 2021-11-22 (×6): qty 1

## 2021-11-22 MED ORDER — APIXABAN 5 MG PO TABS
5.0000 mg | ORAL_TABLET | Freq: Two times a day (BID) | ORAL | Status: DC
  Administered 2021-11-22 – 2021-11-27 (×11): 5 mg via ORAL
  Filled 2021-11-22 (×11): qty 1

## 2021-11-22 MED ORDER — IVABRADINE HCL 5 MG PO TABS
5.0000 mg | ORAL_TABLET | Freq: Two times a day (BID) | ORAL | Status: DC
  Administered 2021-11-22 – 2021-11-26 (×8): 5 mg via ORAL
  Filled 2021-11-22 (×11): qty 1

## 2021-11-22 NOTE — Plan of Care (Signed)

## 2021-11-22 NOTE — Progress Notes (Signed)
ANTICOAGULATION CONSULT NOTE  ? ?Pharmacy Consult for Heparin (Apixaban on hold) ?Indication: chest pain/ACS, history of DVT/PE ? ?Allergies  ?Allergen Reactions  ? Metoclopramide Itching  ?  Loss of Balance; Urinary Incontinence  ? Nsaids   ?  Other reaction(s): Other (See Comments) ?Renal Insufficiency ?Kidney issues ?  ? Amoxicillin-Pot Clavulanate Diarrhea and Nausea And Vomiting  ?  Other reaction(s): Other (See Comments) ?Abdomen Pain ?Abdomen pain ?  ? Statins   ?  Other reaction(s): Other (See Comments) ?Myalgias and Myositis ?myalgias ?  ? Allopurinol Rash  ? Empagliflozin   ?  Other reaction(s): Other (See Comments) ?Fainting, weakness, lightheaded, falling  ? Tiotropium   ?  Other reaction(s): Other (See Comments) ?Urinary retention  ? ? ?Patient Measurements: ?Height: 6\' 1"  (185.4 cm) ?Weight: 115.8 kg (255 lb 4.7 oz) ?IBW/kg (Calculated) : 79.9 ? ?Vital Signs: ?Temp: 97.8 ?F (36.6 ?C) (03/11 0000) ?Temp Source: Oral (03/11 0000) ?BP: 126/79 (03/11 0200) ?Pulse Rate: 84 (03/11 0200) ? ?Labs: ?Recent Labs  ?  11/20/21 ?2207 11/20/21 ?2305 11/21/21 ?[id number] 11/22/21 ?0131  ?HGB 13.5  --  13.0 13.1  ?HCT 40.6  --  38.6* 39.8  ?PLT 271  --  222 292  ?APTT  --   --   --  56*  ?HEPARINUNFRC  --   --   --  >1.10*  ?CREATININE 1.51*  --  1.47* 1.51*  ?TROPONINIHS 123* 887*  --   --   ? ? ? ?Estimated Creatinine Clearance: 59 mL/min (A) (by C-G formula based on SCr of 1.51 mg/dL (H)). ? ? ?Medical History: ?Past Medical History:  ?Diagnosis Date  ? Asthma   ? CHF (congestive heart failure) ([place])   ? Diabetes mellitus without complication ([place])   ? Hypertension   ? Sleep apnea   ? ? ? ? ?Assessment: ?71 y/o M with chest pain and elevated trop to start heparin. On Apixaban PTA for history of DVT and PE. Last dose of Apixaban was 3/9 at 11:00 AM, ok to start heparin now. Anticipate using aPTT to dose for now.  ? ?3/11 AM update:  ?aPTT below goal after re-start s/p cath ? ?Goal of Therapy:  ?Heparin level 0.3-0.7  units/ml ?aPTT 66-102 seconds ?Monitor platelets by anticoagulation protocol: Yes ?  ?Plan:  ?Inc heparin to 1550 units/hr ?1100 heparin level and aPTT ?Daily CBC/Heparin level/aPTT ?Monitor for bleeding ? ?Narda Bonds, PharmD, BCPS ?Clinical Pharmacist ?Phone: [phone] ? ? ? ?

## 2021-11-22 NOTE — Progress Notes (Signed)
Subjective:  Feels well No chest pain Off nitro drip now Excellent urine output overnight  Area of nonhealing wound on left upper back with purulent discharge, discovered yesterday by the nursing staff. Known to patient and wife, reportedly has "flare ups" from time to time.  Objective:  Vital Signs in the last 24 hours: Temp:  [97.8 F (36.6 C)-98.4 F (36.9 C)] 98.4 F (36.9 C) (03/11 0800) Pulse Rate:  [56-108] 86 (03/11 0600) Resp:  [13-28] 20 (03/11 0600) BP: (100-185)/(54-100) 104/72 (03/11 0600) SpO2:  [85 %-100 %] 94 % (03/11 0600)  Intake/Output from previous day: 03/10 0701 - 03/11 0700 In: 602.3 [P.O.:440; I.V.:162.3] Out: 2450 [Urine:2250; Emesis/NG output:200]  Physical Exam Vitals and nursing note reviewed.  Constitutional:      General: He is not in acute distress.    Comments: Looks much better, has pink color to his skin, than gray appearance yesterday  Neck:     Vascular: No JVD.  Cardiovascular:     Rate and Rhythm: Normal rate and regular rhythm.     Heart sounds: Normal heart sounds. No murmur heard. Pulmonary:     Effort: Pulmonary effort is normal.     Breath sounds: Normal breath sounds. No wheezing or rales.  Musculoskeletal:     Right lower leg: No edema.     Left lower leg: No edema.  Skin:    Comments: Left upper back wound, currently dressed      Cardiac Studies:  Telemetry 11/22/2021: No arrhythmia  EKG 11/22/2021: Sinus rhythm Low voltage Inferolateral infarct, age indeterminate  EKG 11/21/2021: Sinus tachycardia 107 bpm Acute inferolateral injury  EKG 11/20/2021; Sinus rhythm Low voltage RBBB, LAFB  EKG 09/01/2021: Sinus rhythm Inferolateral ischemia  Echocardiogram 11/21/2021:  1. Left ventricular ejection fraction, by estimation, is 20 to 25%. The  left ventricle has severely decreased function. The left ventricle  demonstrates global hypokinesis.   2. Right ventricular systolic function is mildly reduced.   3.  Chronic pericardial fat/effusion adjacent to RV, unchanged from  previous echocardiogram in 07/2021.   4. Compared to previous study in 07/2021, global hypokinesis, LV  dysfunction, and mild RV dysfunction are new findings.   Coronary intervention 11/21/2021: LM: Normal LAD: Prox 100% occlusion         Diag 1 70% stenosis, followed by 100% occlusion Lcx: 99% stenosis in prox OM1 with minimal flow distally, which has progressed from previous angiogram in 07/2021        OM2 occluded RCA: Prox 50% stenosis (Present but under appreciated on previous angiogram in 07/2021)         Mid 75% stenosis (Present but under appreciated on previous angiogram in 07/2021)         This lesion either was more severe and occlusive with wires and balloons or developed spasm or dissection to render it flow limiting during the procedure, thus requiring stenting         Distal RCA into RPDA with 95% stenosis         Ostial-prox RPLA 90% stenosis   Successful IVUS guided percutaneous coronary intervention      PTCA RPLA ostium with 2.0 X 8 mm balloon     PTA and stent placement 2.5 X 18 mm Onyx Frontier DES distal RCA/RPDA             Post dilatation with 2.5 X 8 mm  [place] balloon at 18 atm     PTCA and stent placement 3.0 X 18 mm Onyx Frontier  DES mid RCA             Post dilatation with 3.5 X 12 mm [place] balloon at 22 atm   Intraprocedural hypotension and shock requiring IV norepinephrine up to 30 mics, now completely weaned off Intraprocedural STE still persist, likely reflect intraprocedural no flow to RCA and to LAD via collaterals, likely causing myocardial stunning Chest pain down from 7/10 to 1-2/10   He will need close monitoring of his chest pain, EKG, hemodynamics, urine output   Complex procedure due to intra procedural chest pain and ST elevation, diagnostic challenges responsible for slow flow, intra procedural hypotension requiring norepinephrine   Echocardiogram 08/10/2021:  1. Left ventricular  ejection fraction, by estimation, is 55 to 60%. The  left ventricle has normal function. Left ventricular endocardial border  not optimally defined to evaluate regional wall motion. Left ventricular  diastolic parameters are  indeterminate 2D images were obtain on 08/09/2021 LVEF 45-50% w/ RWMA  along the anterolateral and inferolateral walls. Definity images were  obtained 08/10/2021 which notes improvement in LVEF 55-60% without  regional wall motion abnormalities.   2. Right ventricular systolic function is normal. The right ventricular  size is normal. Mildly increased right ventricular wall thickness.   3. A small pericardial effusion is present. There is no evidence of  cardiac tamponade.   4. The mitral valve is normal in structure. Trivial mitral valve  regurgitation. No evidence of mitral stenosis.   5. The aortic valve is tricuspid. Aortic valve regurgitation is not  visualized. No aortic stenosis is present.   6. The inferior vena cava is normal in size with <50% respiratory  variability, suggesting right atrial pressure of 8 mmHg.    Assessment & Recommendations:  71 y.o. Caucasian male  with insulin-dependent type 2 diabetes mellitus with gastroparesis, mixed hyperlipidemia, hypertriglyceridemia, h/o statin intolerance, HFpEF, CKD 3a, h/o DVT-now on eliquis, severe peripheral neuropathy, nonbleeding GI ulcers, s/p clipping for duodenal polyp, stable pancreatic uncinate process mass, poor functional status secondary to multiple medical problems, admitted with NSTEMI, HFrEF, now s/p successful complex PCI to RCA, probably infected nonhealing wound left upper back  NSTEMI: Recurrent angina leading to admission, high sensitive troponin 800. Severe multivessel disease, not a candidate for CABG due to multiple comorbidities and bedbound status. Underwent complex PCI to mid RCA, distal RCA/RPDA, PTCA to R PLA. Excellent final results with no residual stenosis at treated segments,  with retrograde collateralization all the way up to proximal LAD. Intraprocedural no flow, and cardiogenic shock, all resolved with final PCI results. Recommend aspirin, Plavix, Eliquis for 1 month.  Then stop aspirin and continue Plavix and Eliquis, if tolerated with no bleeding.  Plavix can be stopped, if any bleeding issues occur. Holding beta-blocker therapy due to acute systolic heart failure. With successful revascularization to RCA, I have not stopped his Ranexa. Continue statin and PCSK9 inhibitor. Continue management of risk factors, including diabetes.  Defer to primary team.  Systolic heart failure: EF 20-25%, reduced from 50-55% in 07/2021. It is unclear if this occurred in last few months due to ischemic cardiomyopathy-now acute on chronic, or it is entirely due to myocardial stunning-acute systolic heart failure, from no flow during the coronary intervention procedure. If it is the latter, I expect this to improve over a period of time. If it is the former ie due to ischemic cardiomyopathy, I expect this to improve over the next several weeks, as he now has excellent flow in the RCA which is also  collateralizing the LAD. Regardless, he will benefit from guideline directed medical therapy for heart failure. Will hold coreg for today, maybe resume tomorrow, at 3.125 mg bid.  I will likely start Entresto 24-26 mg twice daily tomorrow, with cautious attention to renal function.  Renal function has Added Corlanor 5 mg twice daily today. fortunately stayed stable in spite of his hypotension and contrast use on 11/21/2021.  We will have a better idea if he would develop contrast-induced nephropathy, by 11/23/2021. Discussed with pharmacy regarding SGLT2 inhibitors.  We decided that it was not in the best interest of the patient to use these medications, given risk of UTI in an otherwise bedbound patient. Continue strict I's/O monitoring.  Given his reduced LVEF, he remains at risk for  ventricular arrhythmias.  Due to risk of infections in a bedbound patient with a nonhealing wound on back, I remain highly skeptical of his candidacy for ICD in the long run, should the LVEF not improved.  I encouraged patient and his wife to discuss regarding the long-term goals and wishes regarding resuscitation.  Nonhealing wound: Present on left upper back, with recurrent flareups.  Defer management to primary team.  Wound care consult requested.  I will continue to follow the patient from cardiac standpoint. Patient can be moved to telemetry or progressive floor today.  I have communicated this to primary hospitalist team and appreciate their assistance.  High level complex decision making, discussions with family. Time spent: 110 min  Discussed interpretation of tests and management recommendations with the primary team   Nigel Mormon, MD Pager: [phone] Office: [phone]

## 2021-11-23 ENCOUNTER — Inpatient Hospital Stay (HOSPITAL_COMMUNITY): Payer: Medicare Other

## 2021-11-23 DIAGNOSIS — S21209A Unspecified open wound of unspecified back wall of thorax without penetration into thoracic cavity, initial encounter: Secondary | ICD-10-CM | POA: Diagnosis present

## 2021-11-23 LAB — BASIC METABOLIC PANEL
Anion gap: 11 (ref 5–15)
BUN: 24 mg/dL — ABNORMAL HIGH (ref 8–23)
CO2: 22 mmol/L (ref 22–32)
Calcium: 9.1 mg/dL (ref 8.9–10.3)
Chloride: 98 mmol/L (ref 98–111)
Creatinine, Ser: 1.62 mg/dL — ABNORMAL HIGH (ref 0.61–1.24)
GFR, Estimated: 45 mL/min — ABNORMAL LOW (ref >60)
Glucose, Bld: 163 mg/dL — ABNORMAL HIGH (ref 70–99)
Potassium: 3.7 mmol/L (ref 3.5–5.1)
Sodium: 131 mmol/L — ABNORMAL LOW (ref 135–145)

## 2021-11-23 LAB — CBC
HCT: 36.5 % — ABNORMAL LOW (ref 39.0–52.0)
Hemoglobin: 12.1 g/dL — ABNORMAL LOW (ref 13.0–17.0)
MCH: 30.9 pg (ref 26.0–34.0)
MCHC: 33.2 g/dL (ref 30.0–36.0)
MCV: 93.4 fL (ref 80.0–100.0)
Platelets: 258 10*3/uL (ref 150–400)
RBC: 3.91 MIL/uL — ABNORMAL LOW (ref 4.22–5.81)
RDW: 15.6 % — ABNORMAL HIGH (ref 11.5–15.5)
WBC: 7.9 10*3/uL (ref 4.0–10.5)
nRBC: 0 % (ref 0.0–0.2)

## 2021-11-23 LAB — BRAIN NATRIURETIC PEPTIDE: B Natriuretic Peptide: 880.6 pg/mL — ABNORMAL HIGH (ref 0.0–100.0)

## 2021-11-23 LAB — GLUCOSE, CAPILLARY
Glucose-Capillary: 170 mg/dL — ABNORMAL HIGH (ref 70–99)
Glucose-Capillary: 134 mg/dL — ABNORMAL HIGH (ref 70–99)
Glucose-Capillary: 158 mg/dL — ABNORMAL HIGH (ref 70–99)
Glucose-Capillary: 159 mg/dL — ABNORMAL HIGH (ref 70–99)

## 2021-11-23 MED ORDER — FUROSEMIDE 10 MG/ML IJ SOLN: 40.0000 mg | Freq: Every day | INTRAMUSCULAR | Status: DC

## 2021-11-23 MED ORDER — FUROSEMIDE 10 MG/ML IJ SOLN
40.0000 mg | Freq: Every day | INTRAMUSCULAR | Status: DC
Start: 2021-11-24 — End: 2021-11-25
  Administered 2021-11-24: 40 mg via INTRAVENOUS
  Filled 2021-11-23: qty 4

## 2021-11-23 NOTE — Assessment & Plan Note (Addendum)
Non healing wound on the left upper back with purulent drainage.  Flareup from time to time.  Continue dressing.  Care was consulted and there was some concern for malignancy.  Spoke with general surgery for consultation.

## 2021-11-23 NOTE — Assessment & Plan Note (Addendum)
Compensated at this time.  Will be discharged home on beta-blockers and spironolactone.  I spoke with cardiology prior to disposition.  Unable to use ARB due to borderline low blood pressure and renal failure.  Medications to be adjusted as outpatient.

## 2021-11-23 NOTE — Progress Notes (Signed)
Subjective:  Feels well No chest pain No breathing difficulty Remains off nitro drip  Urine output slower than 3/10 Gave lasix IV 40 mg last night and this morning. 500 CC urine output since 7 AM (Including output in Foley bag that is not documented in chart yet. Spoke with RN) Cr 1.51-->1.62  Area of nonhealing wound on left upper back with purulent discharge, discovered yesterday by the nursing staff. Known to patient and wife, reportedly has "flare ups" from time to time.  Objective:  Vital Signs in the last 24 hours: Temp:  [98 F (36.7 C)-99.2 F (37.3 C)] 98 F (36.7 C) (03/12 0804) Pulse Rate:  [79-93] 83 (03/12 0804) Resp:  [14-25] 20 (03/12 0804) BP: (93-110)/(55-75) 102/55 (03/12 0804) SpO2:  [90 %-97 %] 91 % (03/12 0804)  Intake/Output from previous day: 03/11 0701 - 03/12 0700 In: 100 [P.O.:100] Out: 305 [Urine:305]  Physical Exam Vitals and nursing note reviewed.  Constitutional:      General: He is not in acute distress.    Comments: Looks much better, has pink color to his skin, than gray appearance yesterday  Neck:     Vascular: No JVD.  Cardiovascular:     Rate and Rhythm: Normal rate and regular rhythm.     Heart sounds: Normal heart sounds. No murmur heard. Pulmonary:     Effort: Pulmonary effort is normal.     Breath sounds: Normal breath sounds. No wheezing or rales.  Musculoskeletal:     Right lower leg: No edema.     Left lower leg: No edema.  Skin:    Comments: Left upper back wound, currently dressed      Cardiac Studies:  Telemetry 11/23/2021: No arrhythmia  EKG 11/22/2021: Sinus rhythm Low voltage Inferolateral infarct, age indeterminate  EKG 11/21/2021: Sinus tachycardia 107 bpm Acute inferolateral injury  EKG 11/20/2021; Sinus rhythm Low voltage RBBB, LAFB  EKG 09/01/2021: Sinus rhythm Inferolateral ischemia  Echocardiogram 11/21/2021:  1. Left ventricular ejection fraction, by estimation, is 20 to 25%. The  left  ventricle has severely decreased function. The left ventricle  demonstrates global hypokinesis.   2. Right ventricular systolic function is mildly reduced.   3. Chronic pericardial fat/effusion adjacent to RV, unchanged from  previous echocardiogram in 07/2021.   4. Compared to previous study in 07/2021, global hypokinesis, LV  dysfunction, and mild RV dysfunction are new findings.   Coronary intervention 11/21/2021: LM: Normal LAD: Prox 100% occlusion         Diag 1 70% stenosis, followed by 100% occlusion Lcx: 99% stenosis in prox OM1 with minimal flow distally, which has progressed from previous angiogram in 07/2021        OM2 occluded RCA: Prox 50% stenosis (Present but under appreciated on previous angiogram in 07/2021)         Mid 75% stenosis (Present but under appreciated on previous angiogram in 07/2021)         This lesion either was more severe and occlusive with wires and balloons or developed spasm or dissection to render it flow limiting during the procedure, thus requiring stenting         Distal RCA into RPDA with 95% stenosis         Ostial-prox RPLA 90% stenosis   Successful IVUS guided percutaneous coronary intervention      PTCA RPLA ostium with 2.0 X 8 mm balloon     PTA and stent placement 2.5 X 18 mm Onyx Frontier DES distal RCA/RPDA  Post dilatation with 2.5 X 8 mm [place] balloon at 18 atm     PTCA and stent placement 3.0 X 18 mm Onyx Frontier DES mid RCA             Post dilatation with 3.5 X 12 mm [place] balloon at 22 atm   Intraprocedural hypotension and shock requiring IV norepinephrine up to 30 mics, now completely weaned off Intraprocedural STE still persist, likely reflect intraprocedural no flow to RCA and to LAD via collaterals, likely causing myocardial stunning Chest pain down from 7/10 to 1-2/10   He will need close monitoring of his chest pain, EKG, hemodynamics, urine output   Complex procedure due to intra procedural chest pain and ST  elevation, diagnostic challenges responsible for slow flow, intra procedural hypotension requiring norepinephrine   Echocardiogram 08/10/2021:  1. Left ventricular ejection fraction, by estimation, is 55 to 60%. The  left ventricle has normal function. Left ventricular endocardial border  not optimally defined to evaluate regional wall motion. Left ventricular  diastolic parameters are  indeterminate 2D images were obtain on 08/09/2021 LVEF 45-50% w/ RWMA  along the anterolateral and inferolateral walls. Definity images were  obtained 08/10/2021 which notes improvement in LVEF 55-60% without  regional wall motion abnormalities.   2. Right ventricular systolic function is normal. The right ventricular  size is normal. Mildly increased right ventricular wall thickness.   3. A small pericardial effusion is present. There is no evidence of  cardiac tamponade.   4. The mitral valve is normal in structure. Trivial mitral valve  regurgitation. No evidence of mitral stenosis.   5. The aortic valve is tricuspid. Aortic valve regurgitation is not  visualized. No aortic stenosis is present.   6. The inferior vena cava is normal in size with <50% respiratory  variability, suggesting right atrial pressure of 8 mmHg.    Assessment & Recommendations:  71 y.o. 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 Severe multivessel disease, not a candidate for CABG  due to multiple comorbidities and bedbound status. Underwent complex PCI to mid RCA, distal RCA/RPDA, PTCA to R PLA (11/21/2021) Excellent final results with no residual stenosis at treated segments, with retrograde collateralization all the way up to proximal LAD. Intraprocedural no flow, and cardiogenic shock, all resolved with final PCI results. Recommend aspirin, Plavix, Eliquis for 1 month.  Then stop aspirin and continue Plavix and Eliquis, if tolerated with no bleeding.  Plavix can be stopped, if any bleeding issues occur. Holding beta-blocker therapy due to acute systolic heart failure and low blood pressures. With successful revascularization to RCA, I have not stopped his Ranexa. Continue statin, Vascepa. Continue management of risk factors, including diabetes.  Defer to primary team.  Systolic heart failure: EF 20-25%, reduced from 50-55% in 07/2021. It is unclear if this occurred in last few months due to ischemic cardiomyopathy-now acute on chronic, or it is entirely due to myocardial stunning-acute systolic heart failure, from no flow during the coronary intervention procedure. If it is the latter, I expect this to improve over a period of time.  If it is the former ie due to ischemic cardiomyopathy, I expect this to improve over the next several weeks, as he now has excellent flow in the RCA which is also collateralizing the LAD. Regardless, he will benefit from guideline directed medical therapy for heart failure. BNP 100-->300-->800 Continue IV lasix 40 mg daily. Continue Corlanor 5 mg bid. Continue to hold coreg, Entresto for now, given ongoing renal function recovery, low normal blood pressures. Discussed with pharmacy regarding SGLT2 inhibitors.  We decided that it was not in the best interest of the patient to use these medications, given risk of UTI in an otherwise bedbound patient. Continue strict I's/O monitoring.  I remain cautious about renal function. Cr 1.62 today. He  still remains in the window of for contrast induced nephropathy/ATN given contrast loads on 11/21/2021 and intraprocedural hypotension. He has had a total of 500 cc urine output since 7 AM today. Continue IV lasix 40 mg daily for now. Discontinued hold losartan. Avoid hypotension. Encourage increasing oral intake. Continue I/O monitoring Foley catheter. I will remove Foley at the earliest possible, once renal function is stable.   Given his reduced LVEF, he remains at risk for ventricular arrhythmias.  Due to risk of infections in a bedbound patient with a nonhealing wound on back, I remain highly skeptical of his candidacy for ICD in the long run, should the LVEF not improve. Will consider Life Vest for now.  I encouraged patient and his wife to discuss regarding the long-term goals and wishes regarding resuscitation.  Chest Xray abnormality: Personally reviewed and compared CXR from 11/21/2021 and 11/23/2021.  Vascular congestion has improved. ?Mediastinal masses reported on CXR 11/21/2021, does not look much different from 11/21/2021.  Patient is stable from respiratory standpoint.  Will repeat CXR on 3/13 and consider CT chest if concern remains.    Nonhealing wound: Present on left upper back, with recurrent flareups.  Defer management to primary team.  Wound care consult requested.  High level complex decision making, discussions with family. Time spent: 90 min  Discussed interpretation of tests and management recommendations with the primary team   Nigel Mormon, MD Pager: [phone] Office: [phone]

## 2021-11-23 NOTE — Progress Notes (Signed)
PROGRESS NOTE    Randy Liu  [id number] DOB: [birth year] DOA: 11/20/2021 PCP: [name], MD    Brief Narrative:  Patient is 71 year old male with bedbound status, type 2 diabetes and gastroparesis, hyperlipidemia, statin intolerance, CKD stage IIIa, history of DVT on Eliquis, severe peripheral neuropathy, nonhealing GI ulcers status post duodenal polyp clipping, abnormal pancreatic mass and overall poor functional mobility presented to hospital with on and off chest pain for around 10 days prior to presentation.  Initial troponins were elevated up to 887.  Patient was started on heparin and nitroglycerin.  Cardiology was consulted and patient was admitted to the hospital.  Of note patient does have a history of severe multivessel coronary artery disease in the past and CABG was not an option for him due to comorbidities and bedbound status.  He was also noted to have hypodense lesion in the uncinate process of the pancreas.  During hospitalization, patient underwent cardiac catheterization with some myocardial stunning and low EF, unclear whether this was progressive low EF versus stenting after procedure.  Recently required Levophed in the ICU.  Patient remained stable in the ICU and was transferred out of ICU on 11/23/2021.     Assessment and Plan: * NSTEMI (non-ST elevated myocardial infarction) [facility]) Patient has history of multivessel coronary artery disease.  Patient underwent cardiac catheterization and PCI this admission on 11/21/2021-complex RPDA and mid RCA PCI followed by myocardial stunning.  Patient did have intraprocedural hypotension and required Levophed.  Was briefly in the ICU.  Cardiology on board.  Continue aspirin Plavix Eliquis, fenofibrate Vascepa, losartan nitroglycerin.  2D echocardiogram on 11/21/2021 showed ejection fraction of 20 to 25% compared to 2D echocardiogram from 08/10/2021 with EF of 55 to 60%..   Dyslipidemia Continue fenofibrate and  Vascepa.  Hyponatremia Mild and improved with hydration.  Latest sodium of 135.  Type 2 diabetes mellitus with hyperlipidemia (HCC) On 70/30 and sliding scale insulin.  We will continue to monitor closely.  Continue diabetic diet.  Essential hypertension Not on beta blocker at this time.  Continue losartan.  Asthma Continue albuterol nebulizer and Singulair.  Wound of back Non healing wound on the left upper back with purulent drainage.  Flareup from time to time.  Continue dressing.  Acute systolic heart failure (HCC) Continue IV Lasix, ARB.  Not on.  Cardiology on board.  History of pulmonary embolism Patient has been started on Eliquis.     DVT prophylaxis: SCD's Start: 11/21/21 1840 apixaban (ELIQUIS) tablet 5 mg   Code Status:     Code Status: Full Code  Disposition: Home with home health/uncertain Status is: Inpatient  Remains inpatient appropriate because: Status post cardiac intervention,   Family Communication: Spoke with the patient at bedside.  Consultants:  Cardiology  Procedures:  PCI to RPDA, RPL and mid RCA on 11/21/2021.  Antimicrobials:  None   Anti-infectives (From admission, onward)    None       Subjective: Today, patient was seen and examined at bedside.  Patient denies any chest pain, nausea, vomiting, fever.  Lying in bed.  Objective: Vitals:   11/23/21 0019 11/23/21 0400 11/23/21 0408 11/23/21 0804  BP: (!) 93/57  (!) 100/56 (!) 102/55  Pulse: 80 82 79 83  Resp: 20 20 20 20   Temp: 98.8 F (37.1 C)  98.8 F (37.1 C) 98 F (36.7 C)  TempSrc: Axillary  Axillary Axillary  SpO2: 90% 90% 91% 91%  Weight:      Height:  Intake/Output Summary (Last 24 hours) at 11/23/2021 1154 Last data filed at 11/23/2021 0807 Gross per 24 hour  Intake --  Output 405 ml  Net -405 ml   Filed Weights   11/20/21 2138 11/21/21 0427  Weight: 119.3 kg 115.8 kg   Body mass index is 33.68 kg/m.   Physical Examination:  General: Obese  built, not in obvious distress HENT:   No scleral pallor or icterus noted. Oral mucosa is moist.  Chest:  Diminished breath sounds bilaterally. No crackles or wheezes.  Left upper back wound on dressing. CVS: S1 &S2 heard. No murmur.  Regular rate and rhythm. Abdomen: Soft, nontender, nondistended.  Bowel sounds are heard.  Foley catheter in place.  Extremities: No cyanosis, clubbing or edema.  Peripheral pulses are palpable. Psych: Alert, awake and oriented, normal mood CNS:  No cranial nerve deficits.  Power equal in all extremities.   Skin: Warm and dry.  No rashes noted.  Data Reviewed:   CBC: Recent Labs  Lab 11/20/21 2207 11/21/21 0638 11/22/21 0131 11/23/21 0121  WBC 11.1* 8.8 10.4 7.9  NEUTROABS 8.6*  --   --   --   HGB 13.5 13.0 13.1 12.1*  HCT 40.6 38.6* 39.8 36.5*  MCV 94.6 95.1 94.5 93.4  PLT 271 222 292 0000000    Basic Metabolic Panel: Recent Labs  Lab 11/20/21 2207 11/21/21 0638 11/22/21 0131 11/23/21 1003  NA 134* 131* 135 131*  K 4.8 4.8 4.3 3.7  CL 100 99 99 98  CO2 21* 18* 25 22  GLUCOSE 236* 224* 231* 163*  BUN 21 21 20  24*  CREATININE 1.51* 1.47* 1.51* 1.62*  CALCIUM 9.1 8.9 9.2 9.1    Liver Function Tests: Recent Labs  Lab 11/20/21 2207  AST 19  ALT 12  ALKPHOS 37*  BILITOT 1.5*  PROT 6.3*  ALBUMIN 3.0*     Radiology Studies: DG Chest 1 View  Result Date: 11/21/2021 CLINICAL DATA:  wheezing and chest pain Principal Problem: NSTEMI (non-ST elevated myocardial infarction) EXAM: CHEST  1 VIEW COMPARISON:  Chest x-ray 11/20/2020 FINDINGS: The heart and mediastinal contours are unchanged. Hilar vascular prominence. Atherosclerotic plaque of the aorta. No focal consolidation. Increased interstitial markings. Likely trace left pleural effusion. No right pleural effusion. No pneumothorax. No acute osseous abnormality. IMPRESSION: Pulmonary edema with likely trace left pleural effusion. Electronically Signed   By: Iven Finn M.D.   On:  11/21/2021 19:03   DG CHEST PORT 1 VIEW  Result Date: 11/23/2021 CLINICAL DATA:  71 year old male with history of shortness of breath. Evaluate for pleural effusion or pneumothorax. EXAM: PORTABLE CHEST 1 VIEW COMPARISON:  Chest x-ray 11/21/2021. FINDINGS: Lung volumes are low. No consolidative airspace disease. No pleural effusions. No pneumothorax. No pulmonary nodule or mass noted. Pulmonary vasculature is normal. Heart size is normal. Prominent soft tissues adjacent to the trachea in the upper mediastinum. Atherosclerosis in the thoracic aorta. IMPRESSION: 1. Low lung volumes without radiographic evidence of acute cardiopulmonary disease. 2. Aortic atherosclerosis. 3. Prominent soft tissue density in the upper mediastinum adjacent to the trachea (left-greater-than-right). Findings are concerning for potential lymphadenopathy or mass. Further evaluation with contrast enhanced chest CT is suggested in the near future to better evaluate these findings. Electronically Signed   By: Vinnie Langton M.D.   On: 11/23/2021 09:01   ECHOCARDIOGRAM LIMITED  Result Date: 11/21/2021    ECHOCARDIOGRAM LIMITED REPORT   Patient Name:   Randy Liu Date of Exam: 11/21/2021 Medical Rec #:  [id number]  Height:       73.0 in Accession #:    [id number]         Weight:       255.3 lb Date of Birth:  1951/03/14          BSA:          2.387 m Patient Age:    70 years           BP:           163/81 mmHg Patient Gender: M                  HR:           102 bpm. Exam Location:  Inpatient Procedure: Limited Echo and Intracardiac Opacification Agent STAT ECHO Indications:     chest pain  History:         Patient has prior history of Echocardiogram examinations, most                  recent 08/10/2021. Chronic kidney disease; Risk                  Factors:Hypertension, Diabetes and Dyslipidemia.  Sonographer:     Johny Chess RDCS Referring Phys:  [id number] [facility] J PATWARDHAN Diagnosing Phys: Vernell Leep MD  IMPRESSIONS  1. Left ventricular ejection fraction, by estimation, is 20 to 25%. The left ventricle has severely decreased function. The left ventricle demonstrates global hypokinesis.  2. Right ventricular systolic function is mildly reduced.  3. Chronic pericardial fat/effusion adjacent to RV, unchanged from previous echocardiogram in 07/2021.  4. Compared to previous study in 07/2021, global hypokinesis, LV dysfunction, and mild RV dysfunction are new findings. FINDINGS  Left Ventricle: Left ventricular ejection fraction, by estimation, is 20 to 25%. The left ventricle has severely decreased function. The left ventricle demonstrates global hypokinesis. Definity contrast agent was given IV to delineate the left ventricular endocardial borders. Right Ventricle: Right ventricular systolic function is mildly reduced. Pericardium: Chronic pericardial fat/effusion adjacent to RV, unchanged from previous echocardiogram in 07/2021.  LV Volumes (MOD) LV vol d, MOD A4C: 112.0 ml LV vol s, MOD A4C: 87.3 ml LV SV MOD A4C:     112.0 ml Vernell Leep MD Electronically signed by Vernell Leep MD Signature Date/Time: 11/21/2021/5:36:29 PM    Final       LOS: 2 days    Flora Lipps, MD [facility] 11/23/2021, 11:54 AM

## 2021-11-23 NOTE — Hospital Course (Addendum)
Patient is 71 year old male with bedbound status, type 2 diabetes and gastroparesis, hyperlipidemia, statin intolerance, CKD stage IIIa, history of DVT on Eliquis, severe peripheral neuropathy, nonhealing GI ulcers status post duodenal polyp clipping, abnormal pancreatic mass and overall poor functional mobility presented to hospital with on and off chest pain for around 10 days prior to presentation.  Initial troponins were elevated up to 887.  Patient was started on heparin and nitroglycerin.  Cardiology was consulted and patient was admitted to the hospital.  Of note patient does have a history of severe multivessel coronary artery disease in the past and CABG was not an option for him due to comorbidities and bedbound status.  He was also noted to have hypodense lesion in the uncinate process of the pancreas.  During hospitalization, patient underwent cardiac catheterization with some myocardial stunning and low EF, unclear whether this was progressive low EF versus stenting after procedure.  Recently required Levophed in the ICU.  Patient remained stable in the ICU and was transferred out of ICU on 11/23/2021.  Cardiology following at this time.

## 2021-11-24 ENCOUNTER — Inpatient Hospital Stay (HOSPITAL_COMMUNITY): Payer: Medicare Other

## 2021-11-24 ENCOUNTER — Other Ambulatory Visit (HOSPITAL_COMMUNITY): Payer: Medicare Other

## 2021-11-24 ENCOUNTER — Encounter (HOSPITAL_COMMUNITY): Payer: Self-pay | Admitting: Cardiology

## 2021-11-24 ENCOUNTER — Other Ambulatory Visit (HOSPITAL_COMMUNITY): Payer: Self-pay

## 2021-11-24 DIAGNOSIS — R262 Difficulty in walking, not elsewhere classified: Secondary | ICD-10-CM | POA: Diagnosis present

## 2021-11-24 LAB — CBC
HCT: 33.3 % — ABNORMAL LOW (ref 39.0–52.0)
Hemoglobin: 11.3 g/dL — ABNORMAL LOW (ref 13.0–17.0)
MCH: 31.4 pg (ref 26.0–34.0)
MCHC: 33.9 g/dL (ref 30.0–36.0)
MCV: 92.5 fL (ref 80.0–100.0)
Platelets: 273 10*3/uL (ref 150–400)
RBC: 3.6 MIL/uL — ABNORMAL LOW (ref 4.22–5.81)
RDW: 15.7 % — ABNORMAL HIGH (ref 11.5–15.5)
WBC: 7.5 10*3/uL (ref 4.0–10.5)
nRBC: 0 % (ref 0.0–0.2)

## 2021-11-24 LAB — GLUCOSE, CAPILLARY
Glucose-Capillary: 221 mg/dL — ABNORMAL HIGH (ref 70–99)
Glucose-Capillary: 168 mg/dL — ABNORMAL HIGH (ref 70–99)
Glucose-Capillary: 203 mg/dL — ABNORMAL HIGH (ref 70–99)
Glucose-Capillary: 124 mg/dL — ABNORMAL HIGH (ref 70–99)
Glucose-Capillary: 109 mg/dL — ABNORMAL HIGH (ref 70–99)

## 2021-11-24 LAB — BASIC METABOLIC PANEL
Anion gap: 14 (ref 5–15)
BUN: 27 mg/dL — ABNORMAL HIGH (ref 8–23)
CO2: 22 mmol/L (ref 22–32)
Calcium: 8.9 mg/dL (ref 8.9–10.3)
Chloride: 97 mmol/L — ABNORMAL LOW (ref 98–111)
Creatinine, Ser: 1.6 mg/dL — ABNORMAL HIGH (ref 0.61–1.24)
GFR, Estimated: 45 mL/min — ABNORMAL LOW (ref >60)
Glucose, Bld: 147 mg/dL — ABNORMAL HIGH (ref 70–99)
Potassium: 3.7 mmol/L (ref 3.5–5.1)
Sodium: 133 mmol/L — ABNORMAL LOW (ref 135–145)

## 2021-11-24 MED FILL — Heparin Sod (Porcine)-NaCl IV Soln 1000 Unit/500ML-0.9%: INTRAVENOUS | Qty: 1000 | Status: AC

## 2021-11-24 NOTE — Plan of Care (Signed)
  Problem: Cardiovascular: Goal: Ability to achieve and maintain adequate cardiovascular perfusion will improve Outcome: Progressing Goal: Vascular access site(s) Level 0-1 will be maintained Outcome: Progressing   Problem: Health Behavior/Discharge Planning: Goal: Ability to safely manage health-related needs after discharge will improve Outcome: Progressing   

## 2021-11-24 NOTE — Progress Notes (Signed)
Subjective:  Feels well No chest pain No breathing difficulty   Objective:  Vital Signs in the last 24 hours: Temp:  [97.3 F (36.3 C)-99.1 F (37.3 C)] 98 F (36.7 C) (03/13 0751) Pulse Rate:  [67-83] 79 (03/13 1121) Resp:  [14-23] 17 (03/13 1121) BP: (85-107)/(51-61) 101/56 (03/13 1121) SpO2:  [91 %-98 %] 92 % (03/13 1121)  Intake/Output from previous day: 03/12 0701 - 03/13 0700 In: 120 [P.O.:120] Out: 700 [Urine:700]  Physical Exam Vitals and nursing note reviewed.  Constitutional:      General: He is not in acute distress.    Comments: Looks much better, has pink color to his skin, than gray appearance yesterday  Neck:     Vascular: No JVD.  Cardiovascular:     Rate and Rhythm: Normal rate and regular rhythm.     Heart sounds: Normal heart sounds. No murmur heard. Pulmonary:     Effort: Pulmonary effort is normal.     Breath sounds: Normal breath sounds. No wheezing or rales.  Musculoskeletal:     Right lower leg: No edema.     Left lower leg: No edema.  Skin:    Comments: Left upper back wound, currently dressed      Cardiac Studies:  Telemetry 11/23/2021: No arrhythmia  EKG 11/22/2021: Sinus rhythm Low voltage Inferolateral infarct, age indeterminate  EKG 11/21/2021: Sinus tachycardia 107 bpm Acute inferolateral injury  EKG 11/20/2021; Sinus rhythm Low voltage RBBB, LAFB  EKG 09/01/2021: Sinus rhythm Inferolateral ischemia  Echocardiogram 11/21/2021:  1. Left ventricular ejection fraction, by estimation, is 20 to 25%. The  left ventricle has severely decreased function. The left ventricle  demonstrates global hypokinesis.   2. Right ventricular systolic function is mildly reduced.   3. Chronic pericardial fat/effusion adjacent to RV, unchanged from  previous echocardiogram in 07/2021.   4. Compared to previous study in 07/2021, global hypokinesis, LV  dysfunction, and mild RV dysfunction are new findings.   Coronary intervention  11/21/2021: LM: Normal LAD: Prox 100% occlusion         Diag 1 70% stenosis, followed by 100% occlusion Lcx: 99% stenosis in prox OM1 with minimal flow distally, which has progressed from previous angiogram in 07/2021        OM2 occluded RCA: Prox 50% stenosis (Present but under appreciated on previous angiogram in 07/2021)         Mid 75% stenosis (Present but under appreciated on previous angiogram in 07/2021)         This lesion either was more severe and occlusive with wires and balloons or developed spasm or dissection to render it flow limiting during the procedure, thus requiring stenting         Distal RCA into RPDA with 95% stenosis         Ostial-prox RPLA 90% stenosis   Successful IVUS guided percutaneous coronary intervention      PTCA RPLA ostium with 2.0 X 8 mm balloon     PTA and stent placement 2.5 X 18 mm Onyx Frontier DES distal RCA/RPDA             Post dilatation with 2.5 X 8 mm [place] balloon at 18 atm     PTCA and stent placement 3.0 X 18 mm Onyx Frontier DES mid RCA             Post dilatation with 3.5 X 12 mm [place] balloon at 22 atm   Intraprocedural hypotension and shock requiring IV norepinephrine up to  75 mics, now completely weaned off Intraprocedural STE still persist, likely reflect intraprocedural no flow to RCA and to LAD via collaterals, likely causing myocardial stunning Chest pain down from 7/10 to 1-2/10   He will need close monitoring of his chest pain, EKG, hemodynamics, urine output   Complex procedure due to intra procedural chest pain and ST elevation, diagnostic challenges responsible for slow flow, intra procedural hypotension requiring norepinephrine   Echocardiogram 08/10/2021:  1. Left ventricular ejection fraction, by estimation, is 55 to 60%. The  left ventricle has normal function. Left ventricular endocardial border  not optimally defined to evaluate regional wall motion. Left ventricular  diastolic parameters are  indeterminate 2D images were  obtain on 08/09/2021 LVEF 45-50% w/ RWMA  along the anterolateral and inferolateral walls. Definity images were  obtained 08/10/2021 which notes improvement in LVEF 55-60% without  regional wall motion abnormalities.   2. Right ventricular systolic function is normal. The right ventricular  size is normal. Mildly increased right ventricular wall thickness.   3. A small pericardial effusion is present. There is no evidence of  cardiac tamponade.   4. The mitral valve is normal in structure. Trivial mitral valve  regurgitation. No evidence of mitral stenosis.   5. The aortic valve is tricuspid. Aortic valve regurgitation is not  visualized. No aortic stenosis is present.   6. The inferior vena cava is normal in size with <50% respiratory  variability, suggesting right atrial pressure of 8 mmHg.    Assessment & Recommendations:  71 y.o. 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 Severe multivessel disease, not a candidate for CABG due to multiple comorbidities and bedbound status. Underwent complex PCI to mid RCA, distal RCA/RPDA, PTCA to R PLA (11/21/2021) Excellent final results with no residual stenosis at treated segments, with retrograde collateralization all the way up to proximal LAD. Intraprocedural no flow, and cardiogenic shock, all resolved with final PCI results. Recommend aspirin, Plavix, Eliquis for 1 month.  Then stop aspirin and  continue Plavix and Eliquis, if tolerated with no bleeding.  Plavix can be stopped, if any bleeding issues occur. Holding beta-blocker therapy due to acute systolic heart failure and low blood pressures. With successful revascularization to RCA, I have not stopped his Ranexa. Continue statin, Vascepa. Continue management of risk factors, including diabetes.  Defer to primary team.  Systolic heart failure: EF 20-25%, reduced from 50-55% in 07/2021. It is unclear if this occurred in last few months due to ischemic cardiomyopathy-now acute on chronic, or it is entirely due to myocardial stunning-acute systolic heart failure, from no flow during the coronary intervention procedure. If it is the latter, I expect this to improve over a period of time. If it is the former ie due to ischemic cardiomyopathy, I expect this to improve over the next several weeks, as he now has excellent flow in the RCA which is also collateralizing the LAD. Regardless, he will benefit from guideline directed medical therapy for heart failure. BNP 100-->300-->800 Continue IV lasix 40 mg daily. Continue Corlanor 5 mg bid. Okay to resume coreg  3.125 mg bid today. Continue to hold Entresto for now given low normal blood pressure, stabilizing renal function. May add losartan in a day or two if blood pressure would allow.  Discussed with pharmacy regarding SGLT2 inhibitors.  We decided that it was not in the best interest of the patient to use these medications, given risk of UTI in an otherwise bedbound patient. Continue strict I's/O monitoring.  I think he has now passed the window for CIN without major increase in Cr.  Can discontinue Foley catheter. Need to monitor for urinary retention, Will defer to the primary team.   I will repeat limited echocardiogram today. I expect some improvement in EF if low EF on 3/10 was due to myocardial stunning. If EF remains low, he will need GDMT for heart failure.   Given his reduced  LVEF, he remains at risk for ventricular arrhythmias.  Due to risk of infections in a bedbound patient with a nonhealing wound on back, I remain highly skeptical of his candidacy for ICD in the long run. We will revisit discussion re: Life Vest after echocardiogram today.  Vascular congestion, pulmonary edema resolved. Unclear about the mediastinal findings on 3/12, not reported today.  Nonhealing wound: Present on left upper back, with recurrent flareups.  Defer management to primary team.  Wound care consult requested.   He will likely need short term rehab/facility placement  Discussed interpretation of tests and management recommendations with the primary team   Nigel Mormon, MD Pager: [phone] Office: [phone]

## 2021-11-24 NOTE — NC FL2 (Signed)
[place] LEVEL OF CARE SCREENING TOOL     IDENTIFICATION  Patient Name: Randy Liu Birthdate: 1951/04/01 Sex: male Admission Date (Current Location): 11/20/2021  [facility] and [place] Number:  Herbalist and Address:  [facility], [place] [address], [place],  [address]      Provider Number: [id number]  Attending Physician Name and Address:  Flora Lipps, MD  Relative Name and Phone Number:  [name] [phone]    Current Level of Care: Hospital Recommended Level of Care: [place] Prior Approval Number:    Date Approved/Denied:   PASRR Number: [id number]  Discharge Plan: SNF    Current Diagnoses: Patient Active Problem List   Diagnosis Date Noted   Ambulatory dysfunction 11/24/2021   Wound of back 11/23/2021   Dyslipidemia 11/21/2021   Essential hypertension 99991111   Acute systolic heart failure (HCC)    Chronic heart failure with preserved ejection fraction (HCC)    Intractable nausea and vomiting    Atherosclerotic heart disease    Long-term insulin use ([place])    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 ([place])    Class 2 severe obesity due to excess calories with serious comorbidity and body mass index (BMI) of 35.0 to 35.9 in adult [facility])    Bedbound    Generalized abdominal pain    Stage 3a chronic kidney disease (CKD) ([place]) 08/31/2021   Atherosclerosis of native coronary artery of native heart without angina pectoris    Coronary artery disease involving native coronary artery of native heart with unstable angina pectoris ([place])    AKI (acute kidney injury) ([place])    Pure hypercholesterolemia    NSTEMI (non-ST elevated myocardial infarction) ([place]) 08/10/2021   Elevated troponin    Ileus ([place]) 08/09/2021   Non-ST elevation (NSTEMI) myocardial infarction [facility])    Type 2 diabetes mellitus with hyperlipidemia (HCC)    Hypertriglyceridemia    Intractable  abdominal pain    Nausea and vomiting    History of pulmonary embolism    Statin intolerance    Hematochezia 05/07/2021   Small bowel obstruction ([place]) 05/01/2021   SBO (small bowel obstruction) ([place]) 05/01/2021   Yeast infection 12/15/2020   Elevated LFTs 12/15/2020   Acute hypoxemic respiratory failure ([place]) 12/13/2020   Hyponatremia 12/13/2020   Asthma 12/13/2020   Diabetes ([place]) 12/13/2020   Acute cholangitis 12/12/2020    Orientation RESPIRATION BLADDER Height & Weight     Self, Time, Situation, Place (WDL)  Normal Incontinent Weight: 255 lb 4.7 oz (115.8 kg) Height:  6\' 1"  (185.4 cm)  BEHAVIORAL SYMPTOMS/MOOD NEUROLOGICAL BOWEL NUTRITION STATUS      Incontinent Diet (Please see discharge summary)  AMBULATORY STATUS COMMUNICATION OF NEEDS Skin   Extensive Assist Verbally Other (Comment) (appropriate for ethnicity,wound/incision open or dehiced back left upper,foam lift dressing,clean.dry,intact,daily,dressing in place)                       Personal Care Assistance Level of Assistance  Bathing, Feeding, Dressing Bathing Assistance: Maximum assistance Feeding assistance: Limited assistance (Needs assist setting up) Dressing Assistance: Maximum assistance     Functional Limitations Info  Sight, Hearing, Speech Sight Info: Impaired Hearing Info: Impaired Speech Info: Adequate    SPECIAL CARE FACTORS FREQUENCY  PT (By licensed PT), OT (By licensed OT)     PT Frequency: 5x min weekly OT Frequency: 5x min weekly  Contractures Contractures Info: Not present    Additional Factors Info  Code Status, Allergies, Insulin Sliding Scale Code Status Info: FULL Allergies Info: Metoclopramide,Nsaids,Amoxicillin-pot Clavulanate,Statins,Allopurinol,Empagliflozin,Tiotropium   Insulin Sliding Scale Info: insulin aspart (novoLOG) injection 0-9 Units 3 times daily before meals and bedtime,insulin aspart protamine- aspart (NOVOLOG MIX 70/30) injection 40 Units 2  times daily with meals       Current Medications (11/24/2021):  This is the current hospital active medication list Current Facility-Administered Medications  Medication Dose Route Frequency Provider Last Rate Last Admin   0.9 %  sodium chloride infusion   Intravenous Continuous Mansy, Jan A, MD 999 mL/hr at 11/22/21 0000 Infusion Verify at 11/22/21 0000   0.9 %  sodium chloride infusion  250 mL Intravenous PRN Patwardhan, Manish J, MD       acetaminophen (TYLENOL) tablet 650 mg  650 mg Oral Q4H PRN Mansy, Jan A, MD       acidophilus (RISAQUAD) capsule 1 capsule  1 capsule Oral Daily Mansy, Jan A, MD   1 capsule at 11/24/21 1001   albuterol (PROVENTIL) (2.5 MG/3ML) 0.083% nebulizer solution 2.5 mg  2.5 mg Inhalation Q6H PRN Mansy, Jan A, MD       ALPRAZolam Duanne Moron) tablet 0.25 mg  0.25 mg Oral BID PRN Mansy, Jan A, MD   0.25 mg at 11/22/21 0920   apixaban (ELIQUIS) tablet 5 mg  5 mg Oral BID Patwardhan, Manish J, MD   5 mg at 11/24/21 1002   aspirin EC tablet 81 mg  81 mg Oral Daily Patwardhan, Manish J, MD   81 mg at 11/24/21 1001   bisacodyl (DULCOLAX) suppository 10 mg  10 mg Rectal Daily PRN Mansy, Jan A, MD       cholecalciferol (VITAMIN D3) tablet 4,000 Units  4,000 Units Oral Daily Mansy, Jan A, MD   4,000 Units at 11/24/21 1001   clopidogrel (PLAVIX) tablet 75 mg  75 mg Oral Daily Mansy, Jan A, MD   75 mg at 11/24/21 1001   colchicine-probenecid 0.5-500 MG per tablet 1 tablet  1 tablet Oral Daily Mansy, Jan A, MD   1 tablet at 11/24/21 1001   fenofibrate tablet 160 mg  160 mg Oral Daily Mansy, Jan A, MD   160 mg at 11/24/21 1001   furosemide (LASIX) injection 40 mg  40 mg Intravenous Daily [place], Susette Racer, [place]   40 mg at 11/24/21 1002   icosapent Ethyl (VASCEPA) 1 g capsule 1 g  1 g Oral BID Mansy, Jan A, MD   1 g at 11/24/21 1001   insulin aspart (novoLOG) injection 0-9 Units  0-9 Units Subcutaneous TID AC & HS Mansy, Jan A, MD   3 Units at 11/24/21 1245   insulin aspart protamine-  aspart (NOVOLOG MIX 70/30) injection 40 Units  40 Units Subcutaneous BID WC Mansy, Jan A, MD   40 Units at 11/24/21 0930   ivabradine (CORLANOR) tablet 5 mg  5 mg Oral BID WC Patwardhan, Manish J, MD   5 mg at 11/24/21 0830   latanoprost (XALATAN) 0.005 % ophthalmic solution 1 drop  1 drop Left Eye QHS Mansy, Jan A, MD   1 drop at 11/23/21 2109   magnesium oxide (MAG-OX) tablet 400 mg  400 mg Oral BID Mansy, Jan A, MD   400 mg at 11/24/21 1001   meclizine (ANTIVERT) tablet 25 mg  25 mg Oral TID PRN Mansy, Arvella Merles, MD       montelukast (SINGULAIR) tablet  10 mg  10 mg Oral QHS Mansy, Jan A, MD   10 mg at 11/23/21 2103   nitroGLYCERIN (NITROSTAT) SL tablet 0.4 mg  0.4 mg Sublingual Q5 min PRN Orpah Greek, MD   0.4 mg at 11/21/21 0117   nitroGLYCERIN (NITROSTAT) SL tablet 0.4 mg  0.4 mg Sublingual Q5 Min x 3 PRN Mansy, Jan A, MD       ondansetron [facility]) injection 4 mg  4 mg Intravenous Q6H PRN Patwardhan, Manish J, MD   4 mg at 11/23/21 1336   ondansetron (ZOFRAN) tablet 8 mg  8 mg Oral Q8H PRN Mansy, Jan A, MD       ondansetron (ZOFRAN-ODT) disintegrating tablet 4 mg  4 mg Oral Q8H PRN Mansy, Jan A, MD       pantoprazole (PROTONIX) EC tablet 40 mg  40 mg Oral BID Mansy, Jan A, MD   40 mg at 11/24/21 1001   polyethylene glycol (MIRALAX / GLYCOLAX) packet 17 g  17 g Oral QHS Barb Merino, MD   17 g at 11/23/21 2103   polyvinyl alcohol (LIQUIFILM TEARS) 1.4 % ophthalmic solution 1 drop  1 drop Both Eyes Daily PRN Mansy, Jan A, MD       senna-docusate (Senokot-S) tablet 1 tablet  1 tablet Oral Daily Mansy, Jan A, MD   1 tablet at 11/24/21 1001   sodium chloride (MURO 128) 5 % ophthalmic solution 1 drop  1 drop Both Eyes QHS Mansy, Jan A, MD   1 drop at 11/23/21 2108   sodium chloride flush (NS) 0.9 % injection 3 mL  3 mL Intravenous Q12H Patwardhan, Manish J, MD   3 mL at 11/24/21 1003   sodium chloride flush (NS) 0.9 % injection 3 mL  3 mL Intravenous PRN Patwardhan, Reynold Bowen, MD          Discharge Medications: Please see discharge summary for a list of discharge medications.  Relevant Imaging Results:  Relevant Lab Results:   Additional Information [phone], Both Covid Vaccines 2 boosters  Milas Gain, LCSWA

## 2021-11-24 NOTE — Progress Notes (Signed)
Discussed with pt and wife MI, stents, Plavix importance, diet specifically reducing sodium, signs of fluid/HF, NTG. Pt and wife receptive. He is bedbound and therefore n/a for CRPII. We did discuss PT seeing him inpt to ensure he is strong enough to roll in bed for wife to care for him at d/c. CR will sign off.  ?[id number] ?Ethelda Chick CES, ACSM ?10:17 AM ?11/24/2021 ? ?
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
## 2021-11-24 NOTE — Assessment & Plan Note (Signed)
Wheelchair-bound status.  We will get PT evaluation.

## 2021-11-24 NOTE — Consult Note (Signed)
WOC Nurse Consult Note: ?Patient receiving care in [place] ?Reason for Consult:Chronic wound on upper back.  ?Wound type: Non-healing wound to the mid upper back from an old cyst removal over 2 years ago. This wound is tunneling at 3 o'clock 3 cm, small wound proximal to the main wound with no drainage or tunneling.  ?Upper chest non-healing wound that is approximately 3 cm x 2 cm crusted over. Spouse removed some of the crusting to reveal a friable cauliflower appearing wound that she states has been there for a long time. She states it will crust over, the crusting will come off then it comes back. This lesion is very suspicious of a cancerous lesion. [facility] sent to Dr. Louanne Belton to assess and if agreeable to please consult general surgery or the appropriate consult to complete a biopsy of this area.  ?Pressure Injury POA: NA ?Dressing procedure/placement/frequency: ?Clean the back wound with NS then place a cut to fit piece of plain gauze packing moistened with NS into the 3 cm tunneling. Then place a piece of Aquacel Advantage Kellie Simmering # [id number]) over the wound and secure with a foam dressing. Change this dressing daily.  ?Clean the chest wound with NS, pat dry then place a cut to fit piece of Xeroform gauze over the wound and secure with foam dressing. Change this dressing daily. ? ?Monitor the wound area(s) for worsening of condition such as: ?Signs/symptoms of infection, increase in size, development of or worsening of odor, ?development of pain, or increased pain at the affected locations.   ?Notify the medical team if any of these develop. ? ?Thank you for the consult. [facility] nurse will not follow at this time.   ?Please re-consult the [facility] team if needed. ? ?Cathlean Marseilles. Tamala Julian, MSN, RN, CMSRN, AGCNS, WTA ?Wound Treatment Associate ?Pager [phone]   ? ? ?  ?

## 2021-11-24 NOTE — Progress Notes (Signed)
Pt has home CPAP.  

## 2021-11-24 NOTE — Progress Notes (Signed)
?PROGRESS NOTE ? ? ? ?Randy Liu  [id number] DOB: [birth year] DOA: 11/20/2021 ?PCP: [name], MD  ? ? ?Brief Narrative:  ?Patient is 71 year old male with bedbound status, type 2 diabetes and gastroparesis, hyperlipidemia, statin intolerance, CKD stage IIIa, history of DVT on Eliquis, severe peripheral neuropathy, nonhealing GI ulcers status post duodenal polyp clipping, abnormal pancreatic mass and overall poor functional mobility presented to hospital with on and off chest pain for around 10 days prior to presentation.  Initial troponins were elevated up to 887.  Patient was started on heparin and nitroglycerin.  Cardiology was consulted and patient was admitted to the hospital.  Of note patient does have a history of severe multivessel coronary artery disease in the past and CABG was not an option for him due to comorbidities and bedbound status.  He was also noted to have hypodense lesion in the uncinate process of the pancreas.  During hospitalization, patient underwent cardiac catheterization with some myocardial stunning and low EF, unclear whether this was progressive low EF versus stenting after procedure.  Recently required Levophed in the ICU.  Patient remained stable in the ICU and was transferred out of ICU on 11/23/2021.  Cardiology following at this time.  ? ?  ?Assessment and Plan: ?* NSTEMI (non-ST elevated myocardial infarction) ([place]) ?Patient has history of multivessel coronary artery disease.  Patient underwent cardiac catheterization and PCI this admission on 11/21/2021-complex RPDA and mid RCA PCI followed by myocardial stunning.  Patient did have intraprocedural hypotension and required Levophed.  Was briefly in the ICU.  Cardiology on board.  Continue aspirin Plavix Eliquis, fenofibrate Vascepa, losartan nitroglycerin.  2D echocardiogram on 11/21/2021 showed ejection fraction of 20 to 25% compared to 2D echocardiogram from 08/10/2021 with EF of 55 to 60%..  This time Cardiology  monitoring his blood pressure and and urine output.  Likely component of contrast-induced injury as well.  Likely that patient would need LifeVest on discharge.  Spoke at the progression rounds ? ? ?Dyslipidemia ?Continue fenofibrate and Vascepa. ? ?Hyponatremia ?Mild and improved with hydration.  Latest sodium of 133. ? ?Type 2 diabetes mellitus with hyperlipidemia ([place]) ?Hyperglycemia. On 70/30 and sliding scale insulin.  We will continue to monitor closely.  Continue diabetic diet. ? ?Acute systolic heart failure ([facility]) ?Continue IV Lasix. Cardiology on board.  Creatinine elevated at 1.6.  On gentle IV fluid as well.  Follow cardiology recommendations. ? ?Essential hypertension ?Not on beta blocker at this time.  ARB on hold.  Borderline low blood pressure. ? ?Asthma ?Continue albuterol nebulizer and Singulair. ? ?Wound of back ?Non healing wound on the left upper back with purulent drainage.  Flareup from time to time.  Continue dressing.  Care was consulted and there was some concern for malignancy.  Spoke with general surgery for consultation. ? ?History of pulmonary embolism ?Continue Eliquis.  He is also on aspirin and Plavix.  Tolerating well no mention of bleeding. ? ?Ambulatory dysfunction ?Wheelchair-bound status.  We will get PT evaluation. ? ? ? ? DVT prophylaxis: SCD's Start: 11/21/21 1840 ?apixaban (ELIQUIS) tablet 5 mg  ? ?Code Status:   ?  Code Status: Full Code ? ?Disposition: Home with home health/uncertain when okay with cardiology. ? ?Status is: Inpatient ? ?Remains inpatient appropriate because: Status post cardiac intervention, hypotension, renal insufficiency, need for LifeVest ? ? Family Communication:  ?Spoke with the patient's wife at bedside. ? ?Consultants:  ?Cardiology ? ?Procedures:  ?PCI to RPDA, RPL and mid RCA on 11/21/2021. ? ?Antimicrobials:  ?None ? ? ?  Anti-infectives (From admission, onward)  ? ? None  ? ?  ? ? ?Subjective: ?Today, patient was seen and examined at bedside.   Denies any chest pain, dizziness lightheadedness but has some discharge from the back.  Has a Foley catheter in place and patient is mostly wheelchair-bound ? ?Objective: ?Vitals:  ? 11/24/21 0002 11/24/21 0359 11/24/21 0751 11/24/21 1121  ?BP: (!) 99/59 (!) 100/58 (!) 90/56 (!) 101/56  ?Pulse: 75 67 77 79  ?Resp: 19 15 19 17   ?Temp: 99.1 ?F (37.3 ?C) 97.9 ?F (36.6 ?C) 98 ?F (36.7 ?C)   ?TempSrc: Axillary Axillary Oral Oral  ?SpO2: 91% 95% 91% 92%  ?Weight:      ?Height:      ? ? ?Intake/Output Summary (Last 24 hours) at 11/24/2021 1235 ?Last data filed at 11/24/2021 1015 ?Gross per 24 hour  ?Intake 120 ml  ?Output 675 ml  ?Net -555 ml  ? ?Filed Weights  ? 11/20/21 2138 11/21/21 0427  ?Weight: 119.3 kg 115.8 kg  ? ?Body mass index is 33.68 kg/m?.  ? ?Physical Examination: ? ?General: Obese built, not in obvious distress ?HENT:   No scleral pallor or icterus noted. Oral mucosa is moist.  ?Chest:  Diminished breath sounds bilaterally. No crackles or wheezes.  Left upper back wound with dressing. ?CVS: S1 &S2 heard. No murmur.  Regular rate and rhythm. ?Abdomen: Soft, nontender, nondistended.  Bowel sounds are heard.  Foley catheter in place. ?Extremities: No cyanosis, clubbing or edema.  Peripheral pulses are palpable. ?Psych: Alert, awake and oriented, normal mood ?CNS:  No cranial nerve deficits.  Moves extremities. ?Skin: Warm and dry.  No rashes noted. ? ? ?Data Reviewed:  ? ?CBC: ?Recent Labs  ?Lab 11/20/21 ?2207 11/21/21 ?[id number] 11/22/21 ?0131 11/23/21 ?0121 11/24/21 ?0141  ?WBC 11.1* 8.8 10.4 7.9 7.5  ?NEUTROABS 8.6*  --   --   --   --   ?HGB 13.5 13.0 13.1 12.1* 11.3*  ?HCT 40.6 38.6* 39.8 36.5* 33.3*  ?MCV 94.6 95.1 94.5 93.4 92.5  ?PLT 271 222 292 258 273  ? ? ?Basic Metabolic Panel: ?Recent Labs  ?Lab 11/20/21 ?2207 11/21/21 ?[id number] 11/22/21 ?0131 11/23/21 ?1003 11/24/21 ?0141  ?NA 134* 131* 135 131* 133*  ?K 4.8 4.8 4.3 3.7 3.7  ?CL 100 99 99 98 97*  ?CO2 21* 18* 25 22 22   ?GLUCOSE 236* 224* 231* 163* 147*  ?BUN  21 21 20  24* 27*  ?CREATININE 1.51* 1.47* 1.51* 1.62* 1.60*  ?CALCIUM 9.1 8.9 9.2 9.1 8.9  ? ? ?Liver Function Tests: ?Recent Labs  ?Lab 11/20/21 ?2207  ?AST 19  ?ALT 12  ?ALKPHOS 37*  ?BILITOT 1.5*  ?PROT 6.3*  ?ALBUMIN 3.0*  ? ? ? ?Radiology Studies: ?DG Chest Port 1 View ? ?Result Date: 11/24/2021 ?CLINICAL DATA:  CHF. EXAM: PORTABLE CHEST 1 VIEW COMPARISON:  11/23/2021 FINDINGS: Stable cardiomediastinal contours. Low lung volumes. No pleural effusion or edema. No airspace opacities identified. Scar versus platelike atelectasis noted in the left base. IMPRESSION: Scar versus platelike atelectasis in the left base. Electronically Signed   By: Kerby Moors M.D.   On: 11/24/2021 08:03  ? ?DG CHEST PORT 1 VIEW ? ?Result Date: 11/23/2021 ?CLINICAL DATA:  71 year old male with history of shortness of breath. Evaluate for pleural effusion or pneumothorax. EXAM: PORTABLE CHEST 1 VIEW COMPARISON:  Chest x-ray 11/21/2021. FINDINGS: Lung volumes are low. No consolidative airspace disease. No pleural effusions. No pneumothorax. No pulmonary nodule or mass noted. Pulmonary vasculature is normal. Heart  size is normal. Prominent soft tissues adjacent to the trachea in the upper mediastinum. Atherosclerosis in the thoracic aorta. IMPRESSION: 1. Low lung volumes without radiographic evidence of acute cardiopulmonary disease. 2. Aortic atherosclerosis. 3. Prominent soft tissue density in the upper mediastinum adjacent to the trachea (left-greater-than-right). Findings are concerning for potential lymphadenopathy or mass. Further evaluation with contrast enhanced chest CT is suggested in the near future to better evaluate these findings. Electronically Signed   By: Vinnie Langton M.D.   On: 11/23/2021 09:01   ? ? ? LOS: 3 days  ? ? ?Flora Lipps, MD ?[facility] ?11/24/2021, 12:35 PM  ?  ?

## 2021-11-24 NOTE — TOC Initial Note (Signed)
Transition of Care (TOC) - Initial/Assessment Note  ? ? ?Patient Details  ?Name: Randy Liu ?MRN: [id number] ?Date of Birth: 1951-02-23 ? ?Transition of Care (TOC) CM/SW Contact:    ?Milas Gain, LCSWA ?Phone Number: ?11/24/2021, 4:01 PM ? ?Clinical Narrative:                 ? ? ?CSW received consult for possible SNF placement at time of discharge. CSW spoke with patient and patients spouse melody at bedside regarding PT recommendation of SNF placement at time of discharge. Patient expressed understanding of PT recommendation and is agreeable to SNF placement at time of discharge. Patient reports preference for [facility] . Patient with patients spouse gave CSW permission to fax out initial referral to [facility], [facility], and [facility].CSW discussed insurance authorization process and will provide patient with medicare compare SNF ratings list with accepted SNF bed offers when available.Patients spouse reports patient has received both covid vaccines as well as 2 boosters. No further questions reported at this time. CSW to continue to follow and assist with discharge planning needs.  ? ?Expected Discharge Plan: [place] ?Barriers to Discharge: Continued Medical Work up ? ? ?Patient Goals and CMS Choice ?Patient states their goals for this hospitalization and ongoing recovery are:: SNF ?CMS Medicare.gov Compare Post Acute Care list provided to:: Patient (patient and spouse) ?Choice offered to / list presented to : Patient, Spouse ? ?Expected Discharge Plan and Services ?Expected Discharge Plan: [place] ?In-house Referral: Clinical Social Work ?  ?  ?Living arrangements for the past 2 months: [place] ?                ?  ?  ?  ?  ?  ?  ?  ?  ?  ?  ? ?Prior Living Arrangements/Services ?Living arrangements for the past 2 months: [place] ?Lives with:: Self, Spouse ?Patient language and need for interpreter reviewed:: Yes ?Do you feel  safe going back to the place where you live?: No   SNF  ?Need for Family Participation in Patient Care: Yes (Comment) ?Care giver support system in place?: Yes (comment) ?  ?Criminal Activity/Legal Involvement Pertinent to Current Situation/Hospitalization: No - Comment as needed ? ?Activities of Daily Living ?  ?ADL Screening (condition at time of admission) ?Patient's cognitive ability adequate to safely complete daily activities?: Yes ?Is the patient deaf or have difficulty hearing?: No ?Does the patient have difficulty seeing, even when wearing glasses/contacts?: No ?Does the patient have difficulty concentrating, remembering, or making decisions?: No ?Patient able to express need for assistance with ADLs?: Yes ?Does the patient have difficulty dressing or bathing?: Yes ?Independently performs ADLs?: No ?Communication: Independent ?Dressing (OT): Needs assistance ?Is this a change from baseline?: Pre-admission baseline ?Grooming: Needs assistance ?Is this a change from baseline?: Pre-admission baseline ?Feeding: Needs assistance ?Is this a change from baseline?: Pre-admission baseline ?Bathing: Needs assistance ?Is this a change from baseline?: Pre-admission baseline ?Toileting: Needs assistance ?Is this a change from baseline?: Pre-admission baseline ?In/Out Bed: Needs assistance ?Is this a change from baseline?: Pre-admission baseline ?Walks in Home: Needs assistance ?Is this a change from baseline?: Pre-admission baseline ?Does the patient have difficulty walking or climbing stairs?: Yes ?Weakness of Legs: Both ?Weakness of Arms/Hands: None ? ?Permission Sought/Granted ?Permission sought to share information with : Case Manager, Family Supports, Customer service manager ?Permission granted to share information with : Yes, Verbal Permission Granted ? Share Information with NAME: [name] ? Permission granted  to share info w AGENCY: SNF ? Permission granted to share info w Relationship: spouse ? Permission  granted to share info w Contact Information: [name] [phone] ? ?Emotional Assessment ?Appearance:: Appears stated age ?Attitude/Demeanor/Rapport: Gracious ?Affect (typically observed): Calm ?Orientation: : Oriented to Self, Oriented to Place, Oriented to  Time, Oriented to Situation (WDL) ?Alcohol / Substance Use: Not Applicable ?Psych Involvement: No (comment) ? ?Admission diagnosis:  NSTEMI (non-ST elevated myocardial infarction) ([place]) [I21.4] ?Chest pain, unspecified type [R07.9] ?Patient Active Problem List  ? Diagnosis Date Noted  ? Ambulatory dysfunction 11/24/2021  ? Wound of back 11/23/2021  ? Dyslipidemia 11/21/2021  ? Essential hypertension 11/21/2021  ? Acute systolic heart failure ([place])   ? Chronic heart failure with preserved ejection fraction ([place])   ? Intractable nausea and vomiting   ? Atherosclerotic heart disease   ? Long-term insulin use ([place])   ? 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 Class 2 severe obesity due to excess calories with serious comorbidity and body mass index (BMI) of 35.0 to 35.9 in adult [facility])   ? Bedbound   ? Generalized abdominal pain   ? Stage 3a chronic kidney disease (CKD) ([place]) 08/31/2021  ? Atherosclerosis of native coronary artery of native heart without angina pectoris   ? Coronary artery disease involving native coronary artery of native heart with unstable angina pectoris ([place])   ? AKI (acute kidney injury) ([place])   ? Pure hypercholesterolemia   ? NSTEMI (non-ST elevated myocardial infarction) ([place]) 08/10/2021  ? Elevated troponin   ? Ileus ([place]) 08/09/2021  ? Non-ST elevation (NSTEMI) myocardial infarction [facility])   ? Type 2 diabetes mellitus with hyperlipidemia ([place])   ? Hypertriglyceridemia   ? Intractable abdominal pain   ? Nausea and vomiting   ? History of pulmonary embolism   ? Statin intolerance   ? Hematochezia 05/07/2021  ? Small bowel obstruction ([place]) 05/01/2021  ? SBO (small bowel obstruction) ([place]) 05/01/2021  ?  Yeast infection 12/15/2020  ? Elevated LFTs 12/15/2020  ? Acute hypoxemic respiratory failure ([place]) 12/13/2020  ? Hyponatremia 12/13/2020  ? Asthma 12/13/2020  ? Diabetes ([place]) 12/13/2020  ? Acute cholangitis 12/12/2020  ? ?PCP:  [name], MD ?Pharmacy:   ?[facility] [place], [place] - [address] ?[address] ?[place] [address] ?Phone: [phone] Fax: [phone] ? ? ? ? ?Social Determinants of Health (SDOH) Interventions ?  ? ?Readmission Risk Interventions ?Readmission Risk Prevention Plan 09/02/2021 08/13/2021  ?Transportation Screening Complete Complete  ?PCP or Specialist Appt within 3-5 Days - Complete  ?[place] or Home Care Consult - Complete  ?Social Work Consult for [place] Planning/Counseling - Complete  ?Palliative Care Screening - Not Applicable  ?Medication Review Press photographer) Complete Complete  ?[place] or Home Care Consult Complete -  ?SW Recovery Care/Counseling Consult Complete -  ?Palliative Care Screening Not Applicable -  ?[place] Not Applicable -  ?Some recent data might be hidden  ? ? ? ?

## 2021-11-25 ENCOUNTER — Other Ambulatory Visit (HOSPITAL_COMMUNITY): Payer: Self-pay

## 2021-11-25 ENCOUNTER — Inpatient Hospital Stay (HOSPITAL_COMMUNITY): Payer: Medicare Other

## 2021-11-25 LAB — ECHOCARDIOGRAM LIMITED
Height: 73 in
S' Lateral: 3.6 cm
Weight: 4084.68 oz

## 2021-11-25 LAB — GLUCOSE, CAPILLARY
Glucose-Capillary: 120 mg/dL — ABNORMAL HIGH (ref 70–99)
Glucose-Capillary: 144 mg/dL — ABNORMAL HIGH (ref 70–99)
Glucose-Capillary: 154 mg/dL — ABNORMAL HIGH (ref 70–99)
Glucose-Capillary: 169 mg/dL — ABNORMAL HIGH (ref 70–99)

## 2021-11-25 LAB — RESP PANEL BY RT-PCR (FLU A&B, COVID) ARPGX2
Influenza A by PCR: NEGATIVE
Influenza B by PCR: NEGATIVE
SARS Coronavirus 2 by RT PCR: NEGATIVE

## 2021-11-25 LAB — BASIC METABOLIC PANEL
Anion gap: 8 (ref 5–15)
BUN: 31 mg/dL — ABNORMAL HIGH (ref 8–23)
CO2: 27 mmol/L (ref 22–32)
Calcium: 9.2 mg/dL (ref 8.9–10.3)
Chloride: 99 mmol/L (ref 98–111)
Creatinine, Ser: 1.78 mg/dL — ABNORMAL HIGH (ref 0.61–1.24)
GFR, Estimated: 40 mL/min — ABNORMAL LOW (ref >60)
Glucose, Bld: 101 mg/dL — ABNORMAL HIGH (ref 70–99)
Potassium: 3.4 mmol/L — ABNORMAL LOW (ref 3.5–5.1)
Sodium: 134 mmol/L — ABNORMAL LOW (ref 135–145)

## 2021-11-25 LAB — CBC
HCT: 32.7 % — ABNORMAL LOW (ref 39.0–52.0)
Hemoglobin: 11 g/dL — ABNORMAL LOW (ref 13.0–17.0)
MCH: 31.2 pg (ref 26.0–34.0)
MCHC: 33.6 g/dL (ref 30.0–36.0)
MCV: 92.6 fL (ref 80.0–100.0)
Platelets: 294 10*3/uL (ref 150–400)
RBC: 3.53 MIL/uL — ABNORMAL LOW (ref 4.22–5.81)
RDW: 15.7 % — ABNORMAL HIGH (ref 11.5–15.5)
WBC: 8 10*3/uL (ref 4.0–10.5)
nRBC: 0 % (ref 0.0–0.2)

## 2021-11-25 LAB — MAGNESIUM: Magnesium: 1.9 mg/dL (ref 1.7–2.4)

## 2021-11-25 MED ORDER — PERFLUTREN LIPID MICROSPHERE
1.0000 mL | INTRAVENOUS | Status: AC | PRN
  Administered 2021-11-25: 7 mL via INTRAVENOUS
  Filled 2021-11-25: qty 10

## 2021-11-25 MED ORDER — POTASSIUM CHLORIDE CRYS ER 20 MEQ PO TBCR
40.0000 meq | EXTENDED_RELEASE_TABLET | Freq: Once | ORAL | Status: AC
  Administered 2021-11-25: 40 meq via ORAL
  Filled 2021-11-25: qty 2

## 2021-11-25 NOTE — TOC Benefit Eligibility Note (Signed)
Patient Advocate Encounter ? ?Insurance verification completed.   ? ?The patient is currently admitted and upon discharge could be taking Corlanor 5 mg. ? ?The current 30 day co-pay is, $306.70.  ? ?The patient is insured through [employer] Medicare Part D  ? ? ? ?Lyndel Safe, CPhT ?Pharmacy Patient Advocate Specialist ?[facility] Patient Advocate Team ?Direct Number: [phone]  Fax: [phone] ? ? ? ? ? ?  ?

## 2021-11-25 NOTE — Progress Notes (Signed)
EF improved to 40-45%. Can discontinue corlanor.  ?Continue Aspirin, plavix, eliquis for 1 month. Then, discontinue Aspirin. ?Holding off beta blocker given low normal blood pressure. ?Pre-renal AKI. Encourage increase oral intake. ?Dr. Terri Skains will peripherally follow the patient. ?Will arrange outpatient follow up. ? ? ?Nigel Mormon, MD ?Pager: [phone] ?Office: [phone] ? ?

## 2021-11-25 NOTE — Progress Notes (Signed)
Echocardiogram ?2D Echocardiogram has been performed. ? ?Randy Liu ?11/25/2021, 10:46 AM ?

## 2021-11-25 NOTE — Progress Notes (Signed)
Patient uses his home CPAP without assistance. RT will monitor as needed. ?

## 2021-11-25 NOTE — Progress Notes (Signed)
Subjective:  ?Feels well ?No chest pain ?No breathing difficulty ? ? ?Objective:  ?Vital Signs in the last 24 hours: ?Temp:  [97.9 ?F (36.6 ?C)-99.8 ?F (37.7 ?C)] 97.9 ?F (36.6 ?C) (03/14 0450) ?Pulse Rate:  [63-79] 69 (03/14 0759) ?Resp:  [17-20] 18 (03/14 0759) ?BP: (93-101)/(52-58) 101/58 (03/14 0759) ?SpO2:  [91 %-95 %] 95 % (03/14 0759) ? ?Intake/Output from previous day: ?03/13 0701 - 03/14 0700 ?In: 120 [P.O.:120] ?Out: 750 [Urine:750] ? ?Physical Exam ?Vitals and nursing note reviewed.  ?Constitutional:   ?   General: He is not in acute distress. ?Neck:  ?   Vascular: No JVD.  ?Cardiovascular:  ?   Rate and Rhythm: Normal rate and regular rhythm.  ?   Heart sounds: Normal heart sounds. No murmur heard. ?Pulmonary:  ?   Effort: Pulmonary effort is normal.  ?   Breath sounds: Normal breath sounds. No wheezing or rales.  ?Musculoskeletal:  ?   Right lower leg: No edema.  ?   Left lower leg: No edema.  ?Skin: ?   Comments: Left upper back wound, currently dressed  ? ? ? ? ?Cardiac Studies: ? ?Telemetry 11/23/2021: ?No arrhythmia ? ?EKG 11/22/2021: ?Sinus rhythm ?Low voltage ?Inferolateral infarct, age indeterminate ? ?EKG 11/21/2021: ?Sinus tachycardia 107 bpm ?Acute inferolateral injury ? ?EKG 11/20/2021; ?Sinus rhythm ?Low voltage ?RBBB, LAFB ? ?EKG 09/01/2021: ?Sinus rhythm ?Inferolateral ischemia ? ?Echocardiogram 11/21/2021: ? 1. Left ventricular ejection fraction, by estimation, is 20 to 25%. The  ?left ventricle has severely decreased function. The left ventricle  ?demonstrates global hypokinesis.  ? 2. Right ventricular systolic function is mildly reduced.  ? 3. Chronic pericardial fat/effusion adjacent to RV, unchanged from  ?previous echocardiogram in 07/2021.  ? 4. Compared to previous study in 07/2021, global hypokinesis, LV  ?dysfunction, and mild RV dysfunction are new findings.  ? ?Coronary intervention 11/21/2021: ?LM: Normal ?LAD: Prox 100% occlusion ?        Diag 1 70% stenosis, followed by 100%  occlusion ?Lcx: 99% stenosis in prox OM1 with minimal flow distally, which has progressed from previous angiogram in 07/2021 ?       OM2 occluded ?RCA: Prox 50% stenosis (Present but under appreciated on previous angiogram in 07/2021) ?        Mid 75% stenosis (Present but under appreciated on previous angiogram in 07/2021) ?        This lesion either was more severe and occlusive with wires and balloons or developed spasm or dissection to render it flow limiting during the procedure, thus requiring stenting ?        Distal RCA into RPDA with 95% stenosis ?        Ostial-prox RPLA 90% stenosis ?  ?Successful IVUS guided percutaneous coronary intervention  ?    PTCA RPLA ostium with 2.0 X 8 mm balloon ?    PTA and stent placement 2.5 X 18 mm Onyx Frontier DES distal RCA/RPDA ?            Post dilatation with 2.5 X 8 mm [place] balloon at 18 atm ?    PTCA and stent placement 3.0 X 18 mm Onyx Frontier DES mid RCA ?            Post dilatation with 3.5 X 12 mm [place] balloon at 22 atm ?  ?Intraprocedural hypotension and shock requiring IV norepinephrine up to 30 mics, now completely weaned off ?Intraprocedural STE still persist, likely reflect intraprocedural no flow to RCA and  to LAD via collaterals, likely causing myocardial stunning ?Chest pain down from 7/10 to 1-2/10 ?  ?He will need close monitoring of his chest pain, EKG, hemodynamics, urine output ?  ?Complex procedure due to intra procedural chest pain and ST elevation, diagnostic challenges responsible for slow flow, intra procedural hypotension requiring norepinephrine ?  ?Echocardiogram 08/10/2021: ? 1. Left ventricular ejection fraction, by estimation, is 55 to 60%. The  ?left ventricle has normal function. Left ventricular endocardial border  ?not optimally defined to evaluate regional wall motion. Left ventricular  ?diastolic parameters are  ?indeterminate 2D images were obtain on 08/09/2021 LVEF 45-50% w/ RWMA  ?along the anterolateral and inferolateral walls.  Definity images were  ?obtained 08/10/2021 which notes improvement in LVEF 55-60% without  ?regional wall motion abnormalities.  ? 2. Right ventricular systolic function is normal. The right ventricular  ?size is normal. Mildly increased right ventricular wall thickness.  ? 3. A small pericardial effusion is present. There is no evidence of  ?cardiac tamponade.  ? 4. The mitral valve is normal in structure. Trivial mitral valve  ?regurgitation. No evidence of mitral stenosis.  ? 5. The aortic valve is tricuspid. Aortic valve regurgitation is not  ?visualized. No aortic stenosis is present.  ? 6. The inferior vena cava is normal in size with <50% respiratory  ?variability, suggesting right atrial pressure of 8 mmHg.  ? ? ?Assessment & Recommendations: ? ?71 y.o. 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 ?NSTEMI: ?Recurrent angina leading to admission, high sensitive troponin 800 on admission (11/20/2021). ?Severe multivessel disease, not a candidate for CABG due to multiple comorbidities and bedbound status. ?Underwent complex PCI to mid RCA, distal RCA/RPDA, PTCA to R PLA (11/21/2021) ?Excellent final results with no residual stenosis at treated segments, with retrograde collateralization all the way up to proximal LAD. ?Intraprocedural no flow, and cardiogenic shock, all resolved with final PCI results. ?Recommend aspirin, Plavix, Eliquis for 1 month.  Then stop aspirin and continue Plavix and Eliquis, if tolerated with no bleeding.  Plavix can be stopped, if any  bleeding issues occur. ?Holding beta-blocker therapy due to acute systolic heart failure and low blood pressures. ?With successful revascularization to RCA, I have stopped his Ranexa. (Correction from previous note) ?Continue statin, Vascepa. ?Continue management of risk factors, including diabetes.  Defer to primary team. ? ?Systolic heart failure: ?EF 20-25%, reduced from 50-55% in 07/2021. ?It is unclear if this occurred in last few months due to ischemic cardiomyopathy-now acute on chronic, or it is entirely due to myocardial stunning-acute systolic heart failure, from no flow during the coronary intervention procedure. If it is the latter, I expect this to improve over a period of time. If it is the former ie due to ischemic cardiomyopathy, I expect this to improve over the next several weeks, as he now has excellent flow in the RCA which is also collateralizing the LAD. Regardless, he will benefit from guideline directed medical therapy for heart failure. ?BNP 100-->300-->800 ?Appears fairly euvolumic. With now rising Cr (1.78 today, low normal BP, I am holding his lasix and coreg today. ?Encourage increased PO intake.  ?Continue Corlanor 5 mg bid. ?  Continue to hold Entresto for now given low normal blood pressure, stabilizing renal function. ?May add losartan in a day or two if blood pressure would allow.  ?Discussed with pharmacy regarding SGLT2 inhibitors.  We decided that it was not in the best interest of the patient to use these medications, given risk of UTI in an otherwise bedbound patient. ?Continue strict I's/O monitoring. ? ?Limited echocardiogram pending.  ?I expect some improvement in EF if low EF on 3/10 was due to myocardial stunning. If EF remains low, he will need GDMT for heart failure.  ? ?Given his reduced LVEF, he remains at risk for ventricular arrhythmias.  Due to risk of infections in a bedbound patient with a nonhealing wound on back, I remain highly skeptical of his candidacy for ICD  in the long run. ?We will revisit discussion re: Life Vest after limited echocardiogram. ? ?Nonhealing wound: ?Present on left upper back, with recurrent flareups.  Defer management to primary team.  Wound care

## 2021-11-25 NOTE — Plan of Care (Signed)

## 2021-11-25 NOTE — TOC Progression Note (Addendum)
Transition of Care (TOC) - Progression Note  ? ? ?Patient Details  ?Name: Randy Liu ?MRN: [id number] ?Date of Birth: 12-20-1950 ? ?Transition of Care (TOC) CM/SW Contact  ?Milas Gain, LCSWA ?Phone Number: ?11/25/2021, 11:53 AM ? ?Clinical Narrative:    ? ?No current SNF bed offers for patient. CSW awaiting on determination from [facility] and [facility] to see if they can offer SNF bed for patient.  ? ?Update-[facility] could not offer SNF bed. Still awaiting on determination with [facility]. ?CSW updated patient spouse. Patients spouse gave CSW permission to fax out initial referral [facility]. CSW awaiting determination.CSW will continue to follow and assist with patients dc planning needs. ? ?Expected Discharge Plan: [place] ?Barriers to Discharge: Continued Medical Work up ? ?Expected Discharge Plan and Services ?Expected Discharge Plan: [place] ?In-house Referral: Clinical Social Work ?  ?  ?Living arrangements for the past 2 months: [place] ?                ?  ?  ?  ?  ?  ?  ?  ?  ?  ?  ? ? ?Social Determinants of Health (SDOH) Interventions ?  ? ?Readmission Risk Interventions ?Readmission Risk Prevention Plan 09/02/2021 08/13/2021  ?Transportation Screening Complete Complete  ?PCP or Specialist Appt within 3-5 Days - Complete  ?[place] or Home Care Consult - Complete  ?Social Work Consult for [place] Planning/Counseling - Complete  ?Palliative Care Screening - Not Applicable  ?Medication Review Press photographer) Complete Complete  ?[place] or Home Care Consult Complete -  ?SW Recovery Care/Counseling Consult Complete -  ?Palliative Care Screening Not Applicable -  ?[place] Not Applicable -  ?Some recent data might be hidden  ? ? ?

## 2021-11-25 NOTE — Progress Notes (Signed)
?PROGRESS NOTE ? ? ? ?Randy Liu  [id number] DOB: [birth year] DOA: 11/20/2021 ?PCP: [name], MD  ? ? ?Brief Narrative:  ?Patient is 71 year old male with bedbound status, type 2 diabetes and gastroparesis, hyperlipidemia, statin intolerance, CKD stage IIIa, history of DVT on Eliquis, severe peripheral neuropathy, nonhealing GI ulcers status post duodenal polyp clipping, abnormal pancreatic mass and overall poor functional mobility presented to hospital with on and off chest pain for around 10 days prior to presentation.  Initial troponins were elevated up to 887.  Patient was started on heparin and nitroglycerin.  Cardiology was consulted and patient was admitted to the hospital.  Of note patient does have a history of severe multivessel coronary artery disease in the past and CABG was not an option for him due to comorbidities and bedbound status.  He was also noted to have hypodense lesion in the uncinate process of the pancreas.  During hospitalization, patient underwent cardiac catheterization with some myocardial stunning and low EF, unclear whether this was progressive low EF versus stenting after procedure.  Recently required Levophed in the ICU.  Patient remained stable in the ICU and was transferred out of ICU on 11/23/2021.  Cardiology following at this time.  ? ?  ?Assessment and Plan: ?* NSTEMI (non-ST elevated myocardial infarction) ([place]) ?Patient has history of multivessel coronary artery disease.  Patient underwent cardiac catheterization and PCI this admission on 11/21/2021-complex RPDA and mid RCA PCI followed by myocardial stunning.  Patient did have intraprocedural hypotension and required Levophed.  Was briefly in the ICU.  Cardiology on board.  Continue aspirin Plavix Eliquis, fenofibrate Vascepa.  Ranexa has been stopped.  2D echocardiogram on 11/21/2021 showed ejection fraction of 20 to 25% compared to 2D echocardiogram from 08/10/2021 with EF of 55 to 60%.  Cardiology planning to  repeat a 2D echocardiogram again at this time to decide about LifeVest. ? ?Acute systolic heart failure ([place]) ?Continue IV Lasix. Cardiology on board.  Creatinine elevated at 1.7 from 1.6.  Cardiology following.  Cardiology recommends to hold Lasix and Coreg today.  Continue to hold Entresto due to low normal blood pressure and renal function.  Plan to add losartan in day or 2 if the blood pressure improved ? ?Hyponatremia ?Mild and improved with hydration.  Latest sodium of 134. ? ?Type 2 diabetes mellitus with hyperlipidemia ([place]) ?Hyperglycemia. On 70/30 and sliding scale insulin.  We will continue to monitor closely.  Continue diabetic diet.  Latest POC glucose of 144. ? ?Wound of back ?Non healing wound on the left upper back with purulent drainage.  Flareup from time to time.  Continue dressing.  Care was consulted and there was some concern for malignancy.  Spoke with general surgery for consulted yesterday and recommended no need for antibiotic for the sebaceous cyst flareup.  Anterior chest wall lesion needs to be seen by dermatology as outpatient. ? ?Essential hypertension ?  Borderline low blood pressure.  Not on ARB Coreg/Lasix. ? ?Dyslipidemia ?Continue fenofibrate and Vascepa. ? ?Asthma ?Continue albuterol nebulizer and Singulair.  No obvious wheezes ? ?History of pulmonary embolism ?Continue Eliquis.  He is also on aspirin and Plavix.  Tolerating well no mention of bleeding. ? ?Ambulatory dysfunction ?Wheelchair-bound status.  Physical therapy has been consulted and recommended skilled nursing facility placement. ? ? ? ? DVT prophylaxis: SCD's Start: 11/21/21 1840 ?apixaban (ELIQUIS) tablet 5 mg  ? ?Code Status:   ?  Code Status: Full Code ? ?Disposition: Home with home health/uncertain when okay with cardiology. ? ?  Status is: Inpatient ? ?Remains inpatient appropriate because: Status post cardiac intervention, hypotension, renal insufficiency, possible need for LifeVest ? ? Family Communication:   ?Spoke with the patient's wife at bedside. ? ?Consultants:  ?Cardiology ?Verbal consultation with general surgery. ? ?Procedures:  ?PCI to RPDA, RPL and mid RCA on 11/21/2021. ? ?Antimicrobials:  ?None ? ? ?Anti-infectives (From admission, onward)  ? ? None  ? ?  ? ? ?Subjective: ?Today, patient was seen and examined at bedside.  Denies any shortness of breath, chest pain, fever, chills or rigor.  Sleeping with CPAP on.  Has had a bowel movement. ? ?Objective: ?Vitals:  ? 11/25/21 0008 11/25/21 0450 11/25/21 0759 11/25/21 1151  ?BP: (!) 94/52 (!) 95/56 (!) 101/58 (!) 104/56  ?Pulse: 63 67 69 72  ?Resp: 18 19 18  (!) 22  ?Temp: 97.9 ?F (36.6 ?C) 97.9 ?F (36.6 ?C)  98.1 ?F (36.7 ?C)  ?TempSrc: Oral Oral  Oral  ?SpO2: 95% 91% 95% 94%  ?Weight:      ?Height:      ? ? ?Intake/Output Summary (Last 24 hours) at 11/25/2021 1351 ?Last data filed at 11/25/2021 0452 ?Gross per 24 hour  ?Intake 120 ml  ?Output 250 ml  ?Net -130 ml  ? ?Filed Weights  ? 11/20/21 2138 11/21/21 0427  ?Weight: 119.3 kg 115.8 kg  ? ?Body mass index is 33.68 kg/m?.  ? ?Physical Examination: ? ?General: Obese built, not in obvious distress ?HENT:   No scleral pallor or icterus noted. Oral mucosa is moist.  ?Chest:  Clear breath sounds.  Diminished breath sounds bilaterally. No crackles or wheezes.  Upper back with sinus drainage.  Anterior chest wall with dressing. ?CVS: S1 &S2 heard. No murmur.  Regular rate and rhythm. ?Abdomen: Soft, nontender, nondistended.  Bowel sounds are heard.  External urinary catheter in place. ?Extremities: No cyanosis, clubbing or edema.  Peripheral pulses are palpable. ?Psych: Alert, awake and communicative, ?CNS:  No cranial nerve deficits.  Moves extremities ?Skin: Warm and dry.  Back sinus, anterior chest wall lesion with dressing ? ?Data Reviewed:  ? ?CBC: ?Recent Labs  ?Lab 11/20/21 ?2207 11/21/21 ?[id number] 11/22/21 ?0131 11/23/21 ?0121 11/24/21 ?0141 11/25/21 ?[id number]  ?WBC 11.1* 8.8 10.4 7.9 7.5 8.0  ?NEUTROABS 8.6*  --   --    --   --   --   ?HGB 13.5 13.0 13.1 12.1* 11.3* 11.0*  ?HCT 40.6 38.6* 39.8 36.5* 33.3* 32.7*  ?MCV 94.6 95.1 94.5 93.4 92.5 92.6  ?PLT 271 222 292 258 273 294  ? ? ?Basic Metabolic Panel: ?Recent Labs  ?Lab 11/21/21 ?[id number] 11/22/21 ?0131 11/23/21 ?1003 11/24/21 ?0141 11/25/21 ?0253  ?NA 131* 135 131* 133* 134*  ?K 4.8 4.3 3.7 3.7 3.4*  ?CL 99 99 98 97* 99  ?CO2 18* 25 22 22 27   ?GLUCOSE 224* 231* 163* 147* 101*  ?BUN 21 20 24* 27* 31*  ?CREATININE 1.47* 1.51* 1.62* 1.60* 1.78*  ?CALCIUM 8.9 9.2 9.1 8.9 9.2  ?MG  --   --   --   --  1.9  ? ? ?Liver Function Tests: ?Recent Labs  ?Lab 11/20/21 ?2207  ?AST 19  ?ALT 12  ?ALKPHOS 37*  ?BILITOT 1.5*  ?PROT 6.3*  ?ALBUMIN 3.0*  ? ? ? ?Radiology Studies: ?DG Chest Port 1 View ? ?Result Date: 11/24/2021 ?CLINICAL DATA:  CHF. EXAM: PORTABLE CHEST 1 VIEW COMPARISON:  11/23/2021 FINDINGS: Stable cardiomediastinal contours. Low lung volumes. No pleural effusion or edema. No airspace opacities identified. Scar versus  platelike atelectasis noted in the left base. IMPRESSION: Scar versus platelike atelectasis in the left base. Electronically Signed   By: Kerby Moors M.D.   On: 11/24/2021 08:03   ? ? ? LOS: 4 days  ? ? ?Flora Lipps, MD ?[facility] ?11/25/2021, 1:51 PM  ?  ?

## 2021-11-25 NOTE — Care Management Important Message (Signed)
Important Message ? ?Patient Details  ?Name: Randy Liu ?MRN: [id number] ?Date of Birth: 07/21/1951 ? ? ?Medicare Important Message Given:  Yes ? ? ? ? ?[name] ?11/25/2021, 9:48 AM ?

## 2021-11-26 DIAGNOSIS — K5900 Constipation, unspecified: Secondary | ICD-10-CM

## 2021-11-26 LAB — GLUCOSE, CAPILLARY
Glucose-Capillary: 145 mg/dL — ABNORMAL HIGH (ref 70–99)
Glucose-Capillary: 147 mg/dL — ABNORMAL HIGH (ref 70–99)
Glucose-Capillary: 109 mg/dL — ABNORMAL HIGH (ref 70–99)
Glucose-Capillary: 133 mg/dL — ABNORMAL HIGH (ref 70–99)

## 2021-11-26 LAB — BASIC METABOLIC PANEL
Anion gap: 12 (ref 5–15)
BUN: 30 mg/dL — ABNORMAL HIGH (ref 8–23)
CO2: 26 mmol/L (ref 22–32)
Calcium: 9.3 mg/dL (ref 8.9–10.3)
Chloride: 99 mmol/L (ref 98–111)
Creatinine, Ser: 1.65 mg/dL — ABNORMAL HIGH (ref 0.61–1.24)
GFR, Estimated: 44 mL/min — ABNORMAL LOW (ref >60)
Glucose, Bld: 123 mg/dL — ABNORMAL HIGH (ref 70–99)
Potassium: 4 mmol/L (ref 3.5–5.1)
Sodium: 137 mmol/L (ref 135–145)

## 2021-11-26 LAB — CBC
HCT: 34.6 % — ABNORMAL LOW (ref 39.0–52.0)
Hemoglobin: 11.6 g/dL — ABNORMAL LOW (ref 13.0–17.0)
MCH: 31.4 pg (ref 26.0–34.0)
MCHC: 33.5 g/dL (ref 30.0–36.0)
MCV: 93.5 fL (ref 80.0–100.0)
Platelets: 321 10*3/uL (ref 150–400)
RBC: 3.7 MIL/uL — ABNORMAL LOW (ref 4.22–5.81)
RDW: 15.9 % — ABNORMAL HIGH (ref 11.5–15.5)
WBC: 7.6 10*3/uL (ref 4.0–10.5)
nRBC: 0 % (ref 0.0–0.2)

## 2021-11-26 LAB — MAGNESIUM: Magnesium: 2.1 mg/dL (ref 1.7–2.4)

## 2021-11-26 MED ORDER — POLYETHYLENE GLYCOL 3350 17 G PO PACK
17.0000 g | PACK | Freq: Two times a day (BID) | ORAL | Status: DC
Start: 2021-11-26 — End: 2021-11-27
  Administered 2021-11-26: 17 g via ORAL

## 2021-11-26 MED ORDER — BISACODYL 10 MG RE SUPP
10.0000 mg | Freq: Every day | RECTAL | Status: DC
  Administered 2021-11-26: 10 mg via RECTAL
  Filled 2021-11-26: qty 1

## 2021-11-26 NOTE — Progress Notes (Signed)
?PROGRESS NOTE ? ? ? ?Randy Liu  [id number] DOB: [birth year] DOA: 11/20/2021 ?PCP: [name], MD  ? ? ?Brief Narrative:  ?Patient is 71 year old male with bedbound status, type 2 diabetes and gastroparesis, hyperlipidemia, statin intolerance, CKD stage IIIa, history of DVT on Eliquis, severe peripheral neuropathy, nonhealing GI ulcers status post duodenal polyp clipping, abnormal pancreatic mass and overall poor functional mobility presented to hospital with on and off chest pain for around 10 days prior to presentation.  Initial troponins were elevated up to 887.  Patient was started on heparin and nitroglycerin.  Cardiology was consulted and patient was admitted to the hospital.  Of note patient does have a history of severe multivessel coronary artery disease in the past and CABG was not an option for him due to comorbidities and bedbound status.  He was also noted to have hypodense lesion in the uncinate process of the pancreas.  During hospitalization, patient underwent cardiac catheterization with some myocardial stunning and low EF, unclear whether this was progressive low EF versus stenting after procedure.  Recently required Levophed in the ICU.  Patient remained stable in the ICU and was transferred out of ICU on 11/23/2021.  Cardiology following at this time.  ? ?  ?Assessment and Plan: ?* NSTEMI (non-ST elevated myocardial infarction) ([place]) ?Patient has history of multivessel coronary artery disease.  Patient underwent cardiac catheterization and PCI this admission on 11/21/2021-complex RPDA and mid RCA PCI followed by myocardial stunning.  Patient did have intraprocedural hypotension and required Levophed.  Was briefly in the ICU.  Cardiology on board.  Continue aspirin Plavix Eliquis, fenofibrate Vascepa.  Ranexa has been stopped.  2D echocardiogram on 11/21/2021 showed ejection fraction of 20 to 25% compared to 2D echocardiogram from 08/10/2021 with EF of 55 to 60%.  Patient underwent  repeat limited 2D echocardiogram on 11/25/2021 with improvement in LV ejection fraction to 40 to 45%.  We will follow cardiology recommendations  ? ?Acute systolic heart failure ([place]) ?. Cardiology on board.  Creatinine elevated with a creatinine of 1.6 today from 1.7 yesterday. Lasix and Coreg on hold.. Continue to hold Entresto due to low normal blood pressure and renal function.  Plan to add losartan in day or 2 if the blood pressure improved as per cardiology. ? ?Hyponatremia ?Improved.  Latest sodium of 137. ? ?Type 2 diabetes mellitus with hyperlipidemia ([place]) ? On 70/30 and sliding scale insulin.  Continue diabetic diet.  Latest POC glucose of 145. ? ?Wound of back ?Non healing wound on the left upper back with purulent drainage.  Flareup from time to time.  Likely sebaceous cyst sinus.  Continue dressing.  General surgery was consulted and there was some concern for malignancy on the lesion on the front.. Anterior chest wall lesion needs to be seen by dermatology as outpatient will biopsy.  Patient is currently on anticoagulation and is inpatient ? ?Essential hypertension ?Coreg, Lasix on hold.  Blood pressure has improved today. ? ?Dyslipidemia ?Continue fenofibrate and Vascepa. ? ?Asthma ?Continue albuterol nebulizer and Singulair.  No obvious wheezes ? ?Constipation ?Complains of intermittent dull gaseous pain and incomplete evacuation.  Patient is bedbound.  We will change MiraLAX to twice a day.  Add Dulcolax suppository daily. ? ?History of pulmonary embolism ?Continue Eliquis.  He is also on aspirin and Plavix.  Tolerating well no mention of bleeding. ? ?Ambulatory dysfunction ?Wheelchair-bound status.  Physical therapy has been consulted and recommended skilled nursing facility placement. ? ? ? ? DVT prophylaxis: SCD's Start: 11/21/21 1840 ?  apixaban (ELIQUIS) tablet 5 mg  ? ?Code Status:   ?  Code Status: Full Code ? ?Disposition: Skilled nursing facility as per PT evaluation. ? ?Status is:  Inpatient ? ?Remains inpatient appropriate because: Status post cardiac intervention, hypotension, renal insufficiency, awaiting for placement. ? ? Family Communication:  ?Spoke with the patient's wife at bedside and updated [facility]ART about the clinical condition of the patient. ? ?Consultants:  ?Cardiology ?Verbal consultation with general surgery. ? ?Procedures:  ?PCI to RPDA, RPL and mid RCA on 11/21/2021. ? ?Antimicrobials:  ?None ? ? ?Anti-infectives (From admission, onward)  ? ? None  ? ?  ? ? ?Subjective: ?Today, patient was seen and examined at bedside.  Complains of abdominal discomfort yesterday though he had a small bowel movement.  Does not feel like he is able to evacuate adequately.  Denies any shortness of breath, dyspnea chest pain.  [name] at bedside. ? ?Objective: ?Vitals:  ? 11/25/21 2050 11/25/21 2352 11/26/21 0521 11/26/21 0752  ?BP: (!) 107/58 (!) 111/56 105/64 114/60  ?Pulse: 76 63 62   ?Resp: 18 20 11 18   ?Temp: 98.1 ?F (36.7 ?C) 97.8 ?F (36.6 ?C) 97.9 ?F (36.6 ?C)   ?TempSrc: Oral Oral Oral   ?SpO2: 97% 95% 94% 94%  ?Weight:   118.6 kg   ?Height:      ? ? ?Intake/Output Summary (Last 24 hours) at 11/26/2021 0903 ?Last data filed at 11/25/2021 2350 ?Gross per 24 hour  ?Intake 840 ml  ?Output 350 ml  ?Net 490 ml  ? ?Filed Weights  ? 11/20/21 2138 11/21/21 0427 11/26/21 0521  ?Weight: 119.3 kg 115.8 kg 118.6 kg  ? ?Body mass index is 34.5 kg/m?.  ? ?Physical Examination: ? ?General: Obese built, not in obvious distress ?HENT:   No scleral pallor or icterus noted. Oral mucosa is moist.  ?Chest:  Clear breath sounds.  Diminished breath sounds bilaterally. No crackles or wheezes.  Upper back with sinus drainage, anterior chest wall dressing. ?CVS: S1 &S2 heard. No murmur.  Regular rate and rhythm. ?Abdomen: Soft, nontender, nondistended.  Bowel sounds are heard.  External urinary catheter in place. ?Extremities: No cyanosis, clubbing or edema.  Peripheral pulses are palpable. ?Psych: Alert, awake  and communicative. ?CNS:  No cranial nerve deficits.  Moves all extremities. ?Skin: Warm and dry.   ? ?Data Reviewed:  ? ?CBC: ?Recent Labs  ?Lab 11/20/21 ?2207 11/21/21 ?[id number] 11/22/21 ?0131 11/23/21 ?0121 11/24/21 ?0141 11/25/21 ?[id number] 11/26/21 ?[id number]  ?WBC 11.1*   < > 10.4 7.9 7.5 8.0 7.6  ?NEUTROABS 8.6*  --   --   --   --   --   --   ?HGB 13.5   < > 13.1 12.1* 11.3* 11.0* 11.6*  ?HCT 40.6   < > 39.8 36.5* 33.3* 32.7* 34.6*  ?MCV 94.6   < > 94.5 93.4 92.5 92.6 93.5  ?PLT 271   < > 292 258 273 294 321  ? < > = values in this interval not displayed.  ? ? ?Basic Metabolic Panel: ?Recent Labs  ?Lab 11/22/21 ?0131 11/23/21 ?1003 11/24/21 ?0141 11/25/21 ?[id number] 11/26/21 ?[id number]  ?NA 135 131* 133* 134* 137  ?K 4.3 3.7 3.7 3.4* 4.0  ?CL 99 98 97* 99 99  ?CO2 25 22 22 27 26   ?GLUCOSE 231* 163* 147* 101* 123*  ?BUN 20 24* 27* 31* 30*  ?CREATININE 1.51* 1.62* 1.60* 1.78* 1.65*  ?CALCIUM 9.2 9.1 8.9 9.2 9.3  ?MG  --   --   --  1.9 2.1  ? ? ?Liver Function Tests: ?Recent Labs  ?Lab 11/20/21 ?2207  ?AST 19  ?ALT 12  ?ALKPHOS 37*  ?BILITOT 1.5*  ?PROT 6.3*  ?ALBUMIN 3.0*  ? ? ? ?Radiology Studies: ?ECHOCARDIOGRAM LIMITED ? ?Result Date: 11/25/2021 ?   ECHOCARDIOGRAM LIMITED REPORT   Patient Name:   LOWELL ORDOYNE Date of Exam: 11/25/2021 Medical Rec #:  [id number]          Height:       73.0 in Accession #:    [id number]         Weight:       255.3 lb Date of Birth:  05-03-1951          BSA:          2.387 m? Patient Age:    75 years           BP:           95/56 mmHg Patient Gender: M                  HR:           67 bpm. Exam Location:  Inpatient Procedure: 2D Echo, Limited Echo and Intracardiac Opacification Agent Indications:    CHF  History:        Patient has prior history of Echocardiogram examinations, most                 recent 11/21/2021. CHF; Risk Factors:Diabetes and Hypertension.  Sonographer:    Arlyss Gandy Referring Phys: [id number] [facility] J PATWARDHAN  Sonographer Comments: Image acquisition challenging due to patient  body habitus. IMPRESSIONS  1. Mild global hypokinesis with severe anterolateral hypokinesis. LVEF 40-45%.. Left ventricular ejection fraction, by estimation, is 40 to 45%. Comparison(s): Compared to previous study

## 2021-11-26 NOTE — Progress Notes (Signed)
Pt had a large BM with bright red leakage on the sheets. Stool was not dark in color. I messaged Dr. Marlowe Sax to ask if she wanted a fecal occult test and she said to just monitor the pt for now and let her know if the bloody stools continue. Will continue to monitor.  ?

## 2021-11-26 NOTE — TOC Progression Note (Signed)
Transition of Care (TOC) - Progression Note  ? ? ?Patient Details  ?Name: Randy Liu ?MRN: [id number] ?Date of Birth: 1951/03/26 ? ?Transition of Care (TOC) CM/SW Contact  ?Delilah Shan, LCSWA ?Phone Number: ?11/26/2021, 11:38 AM ? ?Clinical Narrative:    ? ?CSW called all pending SNF facilities and patient has no SNF bed offers. CSW informed patients spouse. Patients spouse confirmed she would like to take patient home with hh. CSW informed CM and MD. All questions answered. No further questions reported at this time. ? ?Expected Discharge Plan: Skilled Nursing Facility ?Barriers to Discharge: Continued Medical Work up ? ?Expected Discharge Plan and Services ?Expected Discharge Plan: Skilled Nursing Facility ?In-house Referral: Clinical Social Work ?  ?  ?Living arrangements for the past 2 months: Single Family Home ?                ?  ?  ?  ?  ?  ?  ?  ?  ?  ?  ? ? ?Social Determinants of Health (SDOH) Interventions ?  ? ?Readmission Risk Interventions ?Readmission Risk Prevention Plan 11/25/2021 09/02/2021 08/13/2021  ?Transportation Screening Complete Complete Complete  ?PCP or Specialist Appt within 3-5 Days - - Complete  ?[place] or Home Care Consult - - Complete  ?Social Work Consult for Recovery Care Planning/Counseling - - Complete  ?Palliative Care Screening - - Not Applicable  ?Medication Review Oceanographer) - Complete Complete  ?HRI or Home Care Consult Complete Complete -  ?SW Recovery Care/Counseling Consult Complete Complete -  ?Palliative Care Screening - Not Applicable -  ?Skilled Nursing Facility Complete Not Applicable -  ?Some recent data might be hidden  ? ? ?

## 2021-11-26 NOTE — Plan of Care (Signed)

## 2021-11-26 NOTE — Assessment & Plan Note (Signed)
Complains of intermittent dull gaseous pain and incomplete evacuation.  Patient is bedbound.  We will change MiraLAX to twice a day.  Add Dulcolax suppository daily. ?

## 2021-11-26 NOTE — TOC Progression Note (Signed)
Transition of Care (TOC) - Progression Note  ? ? ?Patient Details  ?Name: Randy Liu ?MRN: [id number] ?Date of Birth: [birth year] ? ?Transition of Care (TOC) CM/SW Contact  ?Pollie Friar, RN ?Phone Number: ?11/26/2021, 12:33 PM ? ?Clinical Narrative:    ?Per SW wife has decided to take the patient home when medically ready with the resumption of [facility] services through [place] called and spoke to patients wife and she verified the plan. She states she has all needed DME at home.  ?She asked to resume services with [facility]. CM has asked MD for home health orders. CM spoke to [place] with [facility] and she verified they have been seeing him and are happy to continue with [facility] services for him. ?Pt will need PTAR home when medically ready. CM has verified the address in the system.  ? ? ?Expected Discharge Plan: [place] ?Barriers to Discharge: Continued Medical Work up ? ?Expected Discharge Plan and Services ?Expected Discharge Plan: [place] ?In-house Referral: Clinical Social Work ?Discharge Planning Services: CM Consult ?Post Acute Care Choice: Home Health ?Living arrangements for the past 2 months: [place] ?                ?  ?  ?  ?  ?  ?HH Arranged: PT, OT ?[place] Agency: [facility] ?Date HH Agency Contacted: 11/26/21 ?  ?Representative spoke with at [place]: Levada Dy ? ? ?Social Determinants of Health (SDOH) Interventions ?  ? ?Readmission Risk Interventions ?Readmission Risk Prevention Plan 11/25/2021 09/02/2021 08/13/2021  ?Transportation Screening Complete Complete Complete  ?PCP or Specialist Appt within 3-5 Days - - Complete  ?[place] or Home Care Consult - - Complete  ?Social Work Consult for [place] Planning/Counseling - - Complete  ?Palliative Care Screening - - Not Applicable  ?Medication Review Press photographer) - Complete Complete  ?[place] or Home Care Consult Complete Complete -  ?SW Recovery Care/Counseling Consult Complete  Complete -  ?Palliative Care Screening - Not Applicable -  ?Skilled Nursing Facility Complete Not Applicable -  ?Some recent data might be hidden  ? ? ?

## 2021-11-27 ENCOUNTER — Other Ambulatory Visit: Payer: Self-pay

## 2021-11-27 ENCOUNTER — Other Ambulatory Visit: Payer: Self-pay | Admitting: Cardiology

## 2021-11-27 DIAGNOSIS — K5900 Constipation, unspecified: Secondary | ICD-10-CM

## 2021-11-27 DIAGNOSIS — R262 Difficulty in walking, not elsewhere classified: Secondary | ICD-10-CM

## 2021-11-27 LAB — BASIC METABOLIC PANEL
Anion gap: 9 (ref 5–15)
BUN: 25 mg/dL — ABNORMAL HIGH (ref 8–23)
CO2: 26 mmol/L (ref 22–32)
Calcium: 9.2 mg/dL (ref 8.9–10.3)
Chloride: 101 mmol/L (ref 98–111)
Creatinine, Ser: 1.49 mg/dL — ABNORMAL HIGH (ref 0.61–1.24)
GFR, Estimated: 50 mL/min — ABNORMAL LOW (ref >60)
Glucose, Bld: 79 mg/dL (ref 70–99)
Potassium: 4.1 mmol/L (ref 3.5–5.1)
Sodium: 136 mmol/L (ref 135–145)

## 2021-11-27 LAB — CBC
HCT: 34.2 % — ABNORMAL LOW (ref 39.0–52.0)
Hemoglobin: 11 g/dL — ABNORMAL LOW (ref 13.0–17.0)
MCH: 30.1 pg (ref 26.0–34.0)
MCHC: 32.2 g/dL (ref 30.0–36.0)
MCV: 93.7 fL (ref 80.0–100.0)
Platelets: 330 10*3/uL (ref 150–400)
RBC: 3.65 MIL/uL — ABNORMAL LOW (ref 4.22–5.81)
RDW: 15.9 % — ABNORMAL HIGH (ref 11.5–15.5)
WBC: 8.3 10*3/uL (ref 4.0–10.5)
nRBC: 0 % (ref 0.0–0.2)

## 2021-11-27 LAB — GLUCOSE, CAPILLARY
Glucose-Capillary: 100 mg/dL — ABNORMAL HIGH (ref 70–99)
Glucose-Capillary: 169 mg/dL — ABNORMAL HIGH (ref 70–99)

## 2021-11-27 LAB — MAGNESIUM: Magnesium: 2.2 mg/dL (ref 1.7–2.4)

## 2021-11-27 MED ORDER — ASPIRIN EC 81 MG PO TBEC: 81.0000 mg | DELAYED_RELEASE_TABLET | Freq: Every day | ORAL | 0 refills | Status: DC

## 2021-11-27 MED ORDER — METOPROLOL SUCCINATE ER 25 MG PO TB24: 12.5000 mg | ORAL_TABLET | Freq: Every day | ORAL | 2 refills | Status: DC

## 2021-11-27 MED ORDER — POLYETHYLENE GLYCOL 3350 17 G PO PACK: 17.0000 g | PACK | Freq: Two times a day (BID) | ORAL | 0 refills | Status: AC

## 2021-11-27 MED ORDER — SPIRONOLACTONE 25 MG PO TABS: 12.5000 mg | ORAL_TABLET | Freq: Every day | ORAL | 2 refills | Status: DC

## 2021-11-27 MED ORDER — CLOPIDOGREL BISULFATE 75 MG PO TABS: 75.0000 mg | ORAL_TABLET | Freq: Every day | ORAL | 2 refills | Status: AC

## 2021-11-27 NOTE — Discharge Summary (Signed)
?Physician Discharge Summary ?  ?Patient: Randy Liu MRN: [id number] DOB: 1951/08/17  ?Admit date:     11/20/2021  ?Discharge date: 11/27/21  ?Discharge Physician: Corrie Mckusick Zaul Hubers  ? ?PCP: [name], MD  ? ?Recommendations at discharge:  ? ?Follow-up with Dr. Terri Skains Cardiology as has been scheduled on 12/04/21.  Patient will need blood work prior to cardiology visit. ?Follow-up recommended with her dermatology as outpatient to address the anterior chest wall lesion which might need biopsy. ? ?Discharge Diagnoses: ?Principal Problem: ?  NSTEMI (non-ST elevated myocardial infarction) ([place]) ?Active Problems: ?  Acute systolic heart failure ([place]) ?  Hyponatremia ?  Type 2 diabetes mellitus with hyperlipidemia ([place]) ?  Dyslipidemia ?  Essential hypertension ?  Wound of back ?  Asthma ?  Constipation ?  History of pulmonary embolism ?  Ambulatory dysfunction ? ?Resolved Problems: ?  * No resolved hospital problems. * ? ?Hospital Course: ?Patient is 71 year old male with bedbound status, type 2 diabetes and gastroparesis, hyperlipidemia, statin intolerance, CKD stage IIIa, history of DVT on Eliquis, severe peripheral neuropathy, nonhealing GI ulcers status post duodenal polyp clipping, abnormal pancreatic mass and overall poor functional mobility presented to hospital with on and off chest pain for around 10 days prior to presentation.  Initial troponins were elevated up to 887.  Patient was started on heparin and nitroglycerin.  Cardiology was consulted and patient was admitted to the hospital.  Of note patient does have a history of severe multivessel coronary artery disease in the past and CABG was not an option for him due to comorbidities and bedbound status.  He was also noted to have hypodense lesion in the uncinate process of the pancreas.  During hospitalization, patient underwent cardiac catheterization with some myocardial stunning and low EF, unclear whether this was progressive low EF versus stenting  after procedure.  Recently required Levophed in the ICU.  Patient remained stable in the ICU and was transferred out of ICU on 11/23/2021.  Cardiology followed the patient during hospitalization and repeat echocardiogram showed improved LV function.  He did have renal failure during hospitalization. ? ?Assessment and Plan: ?* NSTEMI (non-ST elevated myocardial infarction) ([place]) ?Patient has history of multivessel coronary artery disease.  Patient underwent cardiac catheterization and PCI this admission on 11/21/2021-complex RPDA and mid RCA PCI followed by myocardial stunning.  Patient did have intraprocedural hypotension and required Levophed.  Was briefly in the ICU.  Cardiology followed the patient during hospitalization.  Continue aspirin Plavix Eliquis, fenofibrate Vascepa.  Ranexa has been stopped on discharge..  2D echocardiogram on 11/21/2021 showed ejection fraction of 20 to 25% compared to 2D echocardiogram from 08/10/2021 with EF of 55 to 60%.  Patient underwent repeat limited 2D echocardiogram on 11/25/2021 with improvement in LV ejection fraction to 40 to 45%.  Spoke with Dr.Tolia, Cardiology who recommended Toprol-XL 12.5 mg daily and spironolactone 12.5 mg daily.  Patient will need blood work prior to his next scheduled visit.  Spoke with the patient's wife about it as well.  Losartan and Coreg will be discontinued at this time.  Unable to put losartan due to borderline low blood pressure and renal failure. ? ?Acute systolic heart failure ([place]) ?Compensated at this time.  Will be discharged home on beta-blockers and spironolactone.  I spoke with cardiology prior to disposition.  Unable to use ARB due to borderline low blood pressure and renal failure.  Medications to be adjusted as outpatient. ? ?Hyponatremia ?Improved.  Latest sodium of 136 ? ?Type 2 diabetes mellitus  with hyperlipidemia ([facility]) ? On 70/30 at home.  ? ?Wound of back ?Non healing wound on the left upper back with drainage.  Flareup from  time to time.  Likely sebaceous cyst sinus with drainage as per general surgery. Anterior chest wall lesion needs to be seen by dermatology as outpatient with potential  biopsy.  Patient is currently on anticoagulation ? ?Essential hypertension ?Coreg, Lasix on hold.  Spironolactone and Toprol on discharge. ? ?Dyslipidemia ?Continue fenofibrate and Vascepa. ? ?Asthma ?Continue albuterol inhaler and Singulair.  ? ?Constipation ?Complains of intermittent dull gaseous pain and incomplete evacuation.  Patient is bedbound.  We will change MiraLAX to twice a day.  Add Dulcolax suppository daily. ? ?History of pulmonary embolism ?Continue Eliquis.  He is also on aspirin and Plavix.  Spoke with the patient's wife regarding risk of bleeding the risk of the need for medical attention if that were to happen. ? ?Ambulatory dysfunction ?Wheelchair-bound status.  Physical therapy has been consulted and recommended skilled nursing facility placement.  At this time patient is unable to go to a skilled nursing facility so patient will be discharged home. ? ? ?Consultants: Cardiology ? ?Procedures performed:  ?PCI to RPDA, RPL and mid RCA on 11/21/2021.  ? ?Disposition: Home.  Had a prolonged discussion with the patient's wife at bedside regarding the plan for disposition and follow-up ? ?Diet recommendation:  ?Discharge Diet Orders (From admission, onward)  ? ?  Start     Ordered  ? 11/27/21 0000  Diet - low sodium heart healthy       ? 11/27/21 1117  ? ?  ?  ? ?  ? ?Cardiac diet ?DISCHARGE MEDICATION: ?Allergies as of 11/27/2021   ? ?   Reactions  ? Metoclopramide Itching  ? Loss of Balance; Urinary Incontinence  ? Nsaids   ? Other reaction(s): Other (See Comments) ?Renal Insufficiency ?Kidney issues  ? Amoxicillin-pot Clavulanate Diarrhea, Nausea And Vomiting  ? Other reaction(s): Other (See Comments) ?Abdomen Pain ?Abdomen pain  ? Statins   ? Other reaction(s): Other (See Comments) ?Myalgias and Myositis ?myalgias  ? Allopurinol  Rash  ? Empagliflozin   ? Other reaction(s): Other (See Comments) ?Fainting, weakness, lightheaded, falling  ? Tiotropium   ? Other reaction(s): Other (See Comments) ?Urinary retention  ? ?  ? ?  ?Medication List  ?  ? ?STOP taking these medications   ? ?carvedilol 3.125 MG tablet ?Commonly known as: COREG ?  ?losartan 25 MG tablet ?Commonly known as: COZAAR ?  ?ondansetron 8 MG tablet ?Commonly known as: ZOFRAN ?  ?ranolazine 500 MG 12 hr tablet ?Commonly known as: RANEXA ?  ? ?  ? ?TAKE these medications   ? ?acetaminophen 500 MG tablet ?Commonly known as: TYLENOL ?Take 1,000 mg by mouth every 8 (eight) hours as needed for moderate pain. ?  ?Align 4 MG Caps ?Take 4 mg by mouth daily. ?  ?Alpha-Lipoic Acid 300 MG Tabs ?Take 600 mg by mouth daily. ?  ?aspirin EC 81 MG tablet ?Take 1 tablet (81 mg total) by mouth daily. Stop after 30 days ?What changed: additional instructions ?Notes to patient: Stop aspirin on 12/21/2021 ?  ?benzonatate 200 MG capsule ?Commonly known as: TESSALON ?Take 200 mg by mouth every 8 (eight) hours as needed for cough. ?  ?bisacodyl 10 MG suppository ?Commonly known as: DULCOLAX ?Place 1 suppository (10 mg total) rectally daily as needed for up to 10 doses for moderate constipation. ?  ?Blink Tears 0.25 % Soln ?Generic  drug: Polyethylene Glycol 400 ?Place 1 drop into both eyes daily as needed (dry eyes). ?  ?Cholecalciferol 25 MCG (1000 UT) capsule ?Take 4,000 Units by mouth daily. ?  ?clopidogrel 75 MG tablet ?Commonly known as: PLAVIX ?Take 1 tablet (75 mg total) by mouth daily. ?  ?colchicine-probenecid 0.5-500 MG tablet ?Take 1 tablet by mouth daily. ?  ?cyanocobalamin 1000 MCG/ML injection ?Commonly known as: (VITAMIN B-12) ?Inject 1,000 mcg into the skin every 30 (thirty) days. ?  ?Eliquis 5 MG Tabs tablet ?Generic drug: apixaban ?Take 1 tablet (5 mg total) by mouth 2 (two) times daily. Resume on 05/11/21 ?What changed: additional instructions ?  ?fenofibrate 160 MG tablet ?Take 160 mg by  mouth daily. ?  ?fluticasone 50 MCG/ACT nasal spray ?Commonly known as: FLONASE ?Place 1 spray into both nostrils daily. ?  ?latanoprost 0.005 % ophthalmic solution ?Commonly known as: XALATAN ?Place 1 drop
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
## 2021-11-27 NOTE — Care Management Important Message (Signed)
Important Message ? ?Patient Details  ?Name: Randy Liu ?MRN: [id number] ?Date of Birth: [birth year] ? ? ?Medicare Important Message Given:  Yes ? ? ? ? ?[name] ?11/27/2021, 11:27 AM ?

## 2021-11-27 NOTE — TOC Transition Note (Signed)
Transition of Care (TOC) - CM/SW Discharge Note ? ? ?Patient Details  ?Name: Randy Liu ?MRN: [id number] ?Date of Birth: [birth year] ? ?Transition of Care [facility]) CM/SW Contact:  ?Ninfa Meeker, RN ?Phone Number: ?11/27/2021, 11:40 AM ? ? ?Clinical Narrative:   Patient will discharge home with wife. CM has confirmed address and arrange for PTAR transport. Medical Necessity form completed, Bedside RN notified. No further TOC needs identified. .  ? ? ? ?  ?Barriers to Discharge: Continued Medical Work up ? ? ?Patient Goals and CMS Choice ?Patient states their goals for this hospitalization and ongoing recovery are:: SNF ?CMS Medicare.gov Compare Post Acute Care list provided to:: Patient Represenative (must comment) ?Choice offered to / list presented to : Spouse ? ?Discharge Placement ?  ?           ?  ?  ?  ?  ? ?Discharge Plan and Services ?In-house Referral: Clinical Social Work ?Discharge Planning Services: CM Consult ?Post Acute Care Choice: Home Health          ?  ?  ?  ?  ?  ?HH Arranged: PT, OT ?[place] Agency: [facility] ?Date HH Agency Contacted: 11/26/21 ?  ?Representative spoke with at [place]: Levada Dy ? ?Social Determinants of Health (SDOH) Interventions ?  ? ? ?Readmission Risk Interventions ?Readmission Risk Prevention Plan 11/25/2021 09/02/2021 08/13/2021  ?Transportation Screening Complete Complete Complete  ?PCP or Specialist Appt within 3-5 Days - - Complete  ?[place] or Home Care Consult - - Complete  ?Social Work Consult for [place] Planning/Counseling - - Complete  ?Palliative Care Screening - - Not Applicable  ?Medication Review Press photographer) - Complete Complete  ?[place] or Home Care Consult Complete Complete -  ?SW Recovery Care/Counseling Consult Complete Complete -  ?Palliative Care Screening - Not Applicable -  ?Skilled Nursing Facility Complete Not Applicable -  ?Some recent data might be hidden  ? ? ? ? ? ?

## 2021-12-03 ENCOUNTER — Telehealth: Payer: Self-pay

## 2021-12-03 NOTE — Telephone Encounter (Signed)
They are in care everywhere

## 2021-12-03 NOTE — Telephone Encounter (Signed)
Can we have the labs faxed to our office.  ?Please prep for his tele-visit tomorrow ? ?ST

## 2021-12-04 ENCOUNTER — Encounter: Payer: Self-pay | Admitting: Cardiology

## 2021-12-04 ENCOUNTER — Other Ambulatory Visit: Payer: Self-pay | Admitting: Student

## 2021-12-04 ENCOUNTER — Ambulatory Visit: Payer: Medicare Other | Admitting: Cardiology

## 2021-12-04 ENCOUNTER — Other Ambulatory Visit: Payer: Self-pay

## 2021-12-04 VITALS — BP 124/72 | HR 71 | Wt 261.0 lb

## 2021-12-04 DIAGNOSIS — E7800 Pure hypercholesterolemia, unspecified: Secondary | ICD-10-CM

## 2021-12-04 DIAGNOSIS — I2510 Atherosclerotic heart disease of native coronary artery without angina pectoris: Secondary | ICD-10-CM

## 2021-12-04 DIAGNOSIS — I5022 Chronic systolic (congestive) heart failure: Secondary | ICD-10-CM

## 2021-12-04 DIAGNOSIS — Z86711 Personal history of pulmonary embolism: Secondary | ICD-10-CM

## 2021-12-04 DIAGNOSIS — I255 Ischemic cardiomyopathy: Secondary | ICD-10-CM

## 2021-12-04 DIAGNOSIS — E1169 Type 2 diabetes mellitus with other specified complication: Secondary | ICD-10-CM

## 2021-12-04 DIAGNOSIS — I252 Old myocardial infarction: Secondary | ICD-10-CM

## 2021-12-04 DIAGNOSIS — I10 Essential (primary) hypertension: Secondary | ICD-10-CM

## 2021-12-04 MED ORDER — ENTRESTO 24-26 MG PO TABS: 1.0000 | ORAL_TABLET | Freq: Two times a day (BID) | ORAL | 0 refills | Status: DC

## 2021-12-04 NOTE — Progress Notes (Signed)
? ?Telephone visit note ? ?Subjective:  ? ?Randy Liu, male    DOB: May 21, 1951, 71 y.o.   MRN: [id number] ? ? ?I connected with the patient on 12/04/21 by a telephone call and verified that I am speaking with the correct person using two identifiers.  ?   ?I offered the patient a video enabled application for a virtual visit. Unfortunately, this could not be accomplished due to technical difficulties/lack of video enabled phone/computer. I discussed the limitations of evaluation and management by telemedicine and the availability of in person appointments. The patient and wife expressed understanding and agreed to proceed.  ? ?This visit type was conducted as the patient is predominantly bedbound and medical transportation to and from the medical office building is expensive for the patient and his family.  This format is felt to be most appropriate for this patient at this time.  All issues noted in this document were discussed and addressed.  No physical exam was performed. ?The patient has consented to conduct a Tele health visit and understands insurance will be billed.  ? ?Chief complaint:  ?Recent hospitalization, CAD, heart failure management ? ?71 y.o. Caucasian male who is chronically bedbound, type 2 diabetes, gastroparesis, hyperlipidemia, statin intolerance, CKD stage IIIa, history of DVT on Eliquis, severe peripheral neuropathy, nonhealing GI ulcers status post duodenal polyp clipping, CAD status post PCI, ischemic cardiomyopathy, chronic HFrEF, sleep apnea on CPAP.  ? ?Patient presented to the hospital in March 2023 for chest pain and ruled in for ACS.  Patient was taken to the Cath Lab and was found to have multivessel CAD and given his underlying diabetes surgical revascularization would be ideal.  However given his bedbound status and inability to proceed with median sternotomy has shared decision was to proceed with high risk PCI.  Please see the cath report noted below for further  reference. ? ?Post coronary intervention he had an echocardiogram which noted reduced LVEF it was felt that it was likely due to myocardial stunning from no flow phenomena during his recent heart catheterization.  He was monitored closely in the ICU, required inotrope and vasopressor support, and is GDMT had to be titrated given his renal function and soft blood pressures.  Prior to discharge she had another limited echocardiogram which noted improvement in LVEF. ? ?Given his chronic comorbid conditions and immobility due to peripheral neuropathy patient has a primary care provider that comes to his house Ms. [name], NP.  They recently had labs done [facility] on 12/02/2021 which were independently reviewed and noted below for further reference.  Renal function has improved and BNP elevated.  Clinically patient denies any chest pain or heart failure symptoms.  Patient states that " he feels pretty good."  ? ? ?Past Medical History:  ?Diagnosis Date  ? Asthma   ? Cardiomyopathy ([place])   ? CHF (congestive heart failure) ([place])   ? Coronary artery disease   ? Diabetes mellitus without complication ([place])   ? History of DVT (deep vein thrombosis)   ? History of ulcer disease   ? Hyperlipidemia   ? Hypertension   ? Peripheral neuropathy   ? Sleep apnea   ? ? ?Past Surgical History:  ?Procedure Laterality Date  ? BIOPSY  12/14/2020  ? Procedure: BIOPSY;  Surgeon: Jackquline Denmark, MD;  Location: [facility];  Service: Endoscopy;;  ? BIOPSY  09/05/2021  ? Procedure: BIOPSY;  Surgeon: Rush Landmark Telford Nab., MD;  Location: [place];  Service: Gastroenterology;;  ? CHOLECYSTECTOMY N/A 12/17/2020  ? Procedure: LAPAROSCOPIC CHOLECYSTECTOMY, LIVER BIOPSY, PRIMARY UMBILICAL HERNIA REPAIR;  Surgeon: Armandina Gemma, MD;  Location: [facility];  Service: General;  Laterality: N/A;  ? COLONOSCOPY WITH PROPOFOL N/A 05/08/2021  ? Procedure: COLONOSCOPY WITH PROPOFOL;  Surgeon: Carol Ada, MD;   Location: [facility];  Service: Endoscopy;  Laterality: N/A;  ? CORONARY STENT INTERVENTION N/A 11/21/2021  ? Procedure: CORONARY STENT INTERVENTION;  Surgeon: Nigel Mormon, MD;  Location: [facility];  Service: Cardiovascular;  Laterality: N/A;  ? ENDOSCOPIC RETROGRADE CHOLANGIOPANCREATOGRAPHY (ERCP) WITH PROPOFOL N/A 12/14/2020  ? Procedure: ENDOSCOPIC RETROGRADE CHOLANGIOPANCREATOGRAPHY (ERCP) WITH PROPOFOL;  Surgeon: Jackquline Denmark, MD;  Location: [facility];  Service: Endoscopy;  Laterality: N/A;  ? ESOPHAGOGASTRODUODENOSCOPY N/A 09/05/2021  ? Procedure: ESOPHAGOGASTRODUODENOSCOPY (EGD);  Surgeon: Irving Copas., MD;  Location: [facility];  Service: Gastroenterology;  Laterality: N/A;  ? HEMOSTASIS CLIP PLACEMENT  09/05/2021  ? Procedure: HEMOSTASIS CLIP PLACEMENT;  Surgeon: Irving Copas., MD;  Location: [facility];  Service: Gastroenterology;;  ? INTRAVASCULAR ULTRASOUND/IVUS N/A 11/21/2021  ? Procedure: Intravascular Ultrasound/IVUS;  Surgeon: Nigel Mormon, MD;  Location: [place] [facility];  Service: Cardiovascular;  Laterality: N/A;  ? LEFT HEART CATH AND CORONARY ANGIOGRAPHY N/A 08/12/2021  ? Procedure: LEFT HEART CATH AND CORONARY ANGIOGRAPHY;  Surgeon: Adrian Prows, MD;  Location: [place] [facility];  Service: Cardiovascular;  Laterality: N/A;  ? LEFT HEART CATH AND CORONARY ANGIOGRAPHY N/A 11/21/2021  ? Procedure: LEFT HEART CATH AND CORONARY ANGIOGRAPHY;  Surgeon: Nigel Mormon, MD;  Location: [place] [facility];  Service: Cardiovascular;  Laterality: N/A;  ? POLYPECTOMY  05/08/2021  ? Procedure: POLYPECTOMY;  Surgeon: Carol Ada, MD;  [facility];  Service: Endoscopy;;  ? POLYPECTOMY  09/05/2021  ? Procedure: POLYPECTOMY;  Surgeon: Rush Landmark Telford Nab., MD;  Location: [facility];  Service: Gastroenterology;;  ? REMOVAL OF STONES  12/14/2020  ? Procedure: REMOVAL OF STONES;  Surgeon: Jackquline Denmark, MD;  Location: [facility];  Service:  Endoscopy;;  ? SPHINCTEROTOMY  12/14/2020  ? Procedure: SPHINCTEROTOMY;  Surgeon: Jackquline Denmark, MD;  Location: [facility];  Service: Endoscopy;;  ? ? ?Social History  ? ?Socioeconomic History  ? Marital status: Married  ?  Spouse name: Not on file  ? Number of children: 0  ? Years of education: Not on file  ? Highest education level: Not on file  ?Occupational History  ? Not on file  ?Tobacco Use  ? Smoking status: Never  ? Smokeless tobacco: Never  ?Vaping Use  ? Vaping Use: Never used  ?Substance and Sexual Activity  ? Alcohol use: Not Currently  ? Drug use: Never  ? Sexual activity: Not on file  ?Other Topics Concern  ? Not on file  ?Social History Narrative  ? Not on file  ? ?Social Determinants of Health  ? ?Financial Resource Strain: Not on file  ?Food Insecurity: Not on file  ?Transportation Needs: Not on file  ?Physical Activity: Not on file  ?Stress: Not on file  ?Social Connections: Not on file  ?Intimate Partner Violence: Not on file  ? ? ?Family History  ?Problem Relation Age of Onset  ? Heart failure Mother   ? Heart disease Mother   ? Cancer Father   ? ? ?Current Outpatient Medications on File Prior to Visit  ?Medication Sig Dispense Refill  ? acetaminophen (TYLENOL) 500 MG tablet Take 1,000 mg by mouth every 8 (eight) hours as needed for moderate  pain.    ? Alpha-Lipoic Acid 300 MG TABS Take 600 mg by mouth daily.    ? aspirin EC 81 MG tablet Take 1 tablet (81 mg total) by mouth daily. Stop after 30 days 30 tablet 0  ? benzonatate (TESSALON) 200 MG capsule Take 200 mg by mouth every 8 (eight) hours as needed for cough.    ? bisacodyl (DULCOLAX) 10 MG suppository Place 1 suppository (10 mg total) rectally daily as needed for up to 10 doses for moderate constipation. 10 suppository 0  ? Cholecalciferol 25 MCG (1000 UT) capsule Take 4,000 Units by mouth daily.    ? clopidogrel (PLAVIX) 75 MG tablet Take 1 tablet (75 mg total) by mouth daily. 30 tablet 2  ? cyanocobalamin (,VITAMIN B-12,) 1000 MCG/ML  injection Inject 1,000 mcg into the skin every 30 (thirty) days.    ? ELIQUIS 5 MG TABS tablet Take 1 tablet (5 mg total) by mouth 2 (two) times daily. Resume on 05/11/21 (Patient taking differently: Take 5 mg by

## 2021-12-05 NOTE — Telephone Encounter (Signed)
OK to refill medication please advise

## 2021-12-05 NOTE — Telephone Encounter (Signed)
Yes please refill 

## 2021-12-15 ENCOUNTER — Other Ambulatory Visit: Payer: Self-pay

## 2021-12-15 DIAGNOSIS — I2510 Atherosclerotic heart disease of native coronary artery without angina pectoris: Secondary | ICD-10-CM

## 2021-12-15 DIAGNOSIS — I5022 Chronic systolic (congestive) heart failure: Secondary | ICD-10-CM

## 2021-12-17 ENCOUNTER — Telehealth: Payer: Self-pay | Admitting: Cardiology

## 2021-12-17 DIAGNOSIS — I5022 Chronic systolic (congestive) heart failure: Secondary | ICD-10-CM

## 2021-12-17 DIAGNOSIS — I255 Ischemic cardiomyopathy: Secondary | ICD-10-CM

## 2021-12-17 DIAGNOSIS — I252 Old myocardial infarction: Secondary | ICD-10-CM

## 2021-12-17 MED ORDER — TORSEMIDE 10 MG PO TABS
10.0000 mg | ORAL_TABLET | Freq: Every morning | ORAL | 0 refills | Status: DC
Start: 2021-12-17 — End: 2021-12-17

## 2021-12-17 MED ORDER — TORSEMIDE 10 MG PO TABS: 10.0000 mg | ORAL_TABLET | ORAL | 0 refills | Status: DC

## 2021-12-17 NOTE — Telephone Encounter (Signed)
ON-CALL CARDIOLOGY ?12/17/21 ? ?Patient's name: Randy Liu.   ?MRN: [id number].    ?DOB: [birth year] ?Primary care provider: [name], MD. ?Primary cardiologist: Rex Kras, DO, [facility] ? ?Interaction regarding this patient's care today: ?Received a phone call at the office notifying us that his NT proBNP is greater than 4000. ? ?External Labs: ?Collected: 12/16/2021 ?Sodium 136, potassium 4.6, chloride 102, bicarb 23, BUN 25, creatinine 1.41 mg/dL. ?eGFR 53 mL/min per 1.73 m? ?NT proBNP 4924 (no prior baseline).  ?Magnesium level 1.9 ? ?Per his wife, Randy Liu's SBP has been ranging 95-161mHG.  ?Has been talking toprol xl 12.554mpo qday, aldactone 12.37m53mo qday, entresto 24/53m23m bid (couple doses).  ? ?No new LE swelling.  ?Lungs are clear based on home health care nurse.  ?Unable to check daily weight as patient is bed bound.  ? ?Impression: ?  ICD-10-CM   ?1. Chronic HFrEF (heart failure with reduced ejection fraction) (HCC)  I50.W26.37ic metabolic panel  ?  Magnesium  ?  Pro b natriuretic peptide (BNP)  ?  Pro b natriuretic peptide (BNP)  ?  Magnesium  ?  Basic metabolic panel  ?  torsemide (DEMADEX) 10 MG tablet  ?  DISCONTINUED: torsemide (DEMADEX) 10 MG tablet  ?  ?2. Ischemic cardiomyopathy  I25.C58.8ic metabolic panel  ?  Magnesium  ?  Pro b natriuretic peptide (BNP)  ?  Pro b natriuretic peptide (BNP)  ?  Magnesium  ?  Basic metabolic panel  ?  torsemide (DEMADEX) 10 MG tablet  ?  DISCONTINUED: torsemide (DEMADEX) 10 MG tablet  ?  ?3. Hx of non-ST elevation myocardial infarction (NSTEMI)  I25.2   ?  ? ? ?Meds ordered this encounter  ?Medications  ? DISCONTD: torsemide (DEMADEX) 10 MG tablet  ?  Sig: Take 1 tablet (10 mg total) by mouth in the morning.  ?  Dispense:  30 tablet  ?  Refill:  0  ? torsemide (DEMADEX) 10 MG tablet  ?  Sig: Take 1 tablet (10 mg total) by mouth every other day for 30 doses.  ?  Dispense:  30 tablet  ?  Refill:  0  ? ? ?Orders Placed This Encounter  ?Procedures  ? Basic  metabolic panel  ? Magnesium  ? Pro b natriuretic peptide (BNP)  ? ? ?Recommendations: ?Given his recent non-STEMI, ischemic cardiomyopathy and chronic HFrEF patient was started on Entresto 24/26 mg p.o. twice daily.  However, due to soft blood pressures the patient's wife has not been administrating this on a regular basis. ? ?Labs performed recently notes elevated NT proBNP.  No prior levels for comparison.  Clinically patient's wife denies any significant change in clinical status with regards to being more short of breath, lower extremity swelling, orthopnea, paroxysmal nocturnal dyspnea. ? ?Home health care nurse which attends his wounds has noted clear lung sounds. ? ?He has been compliant with dual antiplatelet therapy given his recent coronary interventions. ? ?In order to facilitate diuresis we will hold off on spironolactone 12.5 mg p.o. daily and Toprol-XL 12.5 mg p.o. daily.  Start torsemide 10 mg p.o. q. other day.  Continue Entresto 24/26 mg half a tablet twice daily for now.  Labs in 1 week to evaluate kidney function and electrolytes. ? ?Independently reviewed labs performed at outside facility as noted above, spoke to the patient's wife who is a primary caregiver, spoke to support staff medical assistance pharmacist to better coordinate his care, medication changes as discussed above, complex medical decision  making. ? ?Since he is bedbound and office visits require EMS transport which is quite expensive for the patient/family.  We will continue to manage him medically via telemedicine. ? ?Telephone encounter total time: 30 minutes. ? ?Rex Kras, DO, FACC ? ?Pager: [phone] ?Office: [phone] ? ?

## 2021-12-22 ENCOUNTER — Telehealth: Payer: Medicare Other | Admitting: Cardiology

## 2021-12-25 ENCOUNTER — Other Ambulatory Visit: Payer: Self-pay | Admitting: Student

## 2021-12-26 ENCOUNTER — Encounter: Payer: Self-pay | Admitting: Cardiology

## 2021-12-26 ENCOUNTER — Ambulatory Visit: Payer: Medicare Other | Admitting: Cardiology

## 2021-12-26 VITALS — BP 118/60 | HR 91 | Resp 16 | Ht 73.0 in

## 2021-12-26 DIAGNOSIS — I10 Essential (primary) hypertension: Secondary | ICD-10-CM

## 2021-12-26 DIAGNOSIS — Z86711 Personal history of pulmonary embolism: Secondary | ICD-10-CM

## 2021-12-26 DIAGNOSIS — E7800 Pure hypercholesterolemia, unspecified: Secondary | ICD-10-CM

## 2021-12-26 DIAGNOSIS — I5042 Chronic combined systolic (congestive) and diastolic (congestive) heart failure: Secondary | ICD-10-CM

## 2021-12-26 DIAGNOSIS — I252 Old myocardial infarction: Secondary | ICD-10-CM

## 2021-12-26 DIAGNOSIS — E1169 Type 2 diabetes mellitus with other specified complication: Secondary | ICD-10-CM

## 2021-12-26 DIAGNOSIS — I255 Ischemic cardiomyopathy: Secondary | ICD-10-CM

## 2021-12-26 DIAGNOSIS — I2510 Atherosclerotic heart disease of native coronary artery without angina pectoris: Secondary | ICD-10-CM

## 2021-12-26 NOTE — Progress Notes (Signed)
? ?Telephone visit note ? ?Subjective:  ? ?Randy Liu, male    DOB: 1951/05/02, 71 y.o.   MRN: [id number] ? ?I connected with the patient on 12/26/21 by a telephone call and verified that I am speaking with the correct person using two identifiers.  ?   ?Unfortunately, virtual video visit could not be accomplished due to technical difficulties/lack of video enabled phone/computer. I discussed the limitations of evaluation and management by telemedicine and the availability of in person appointments. The patient and wife expressed understanding and agreed to proceed.  ? ?This visit type was conducted as the patient is predominantly bedbound and medical transportation to and from the medical office building is expensive for the patient and his family.  This format is felt to be most appropriate for this patient at this time.  All issues noted in this document were discussed and addressed.  No physical exam was performed. ?The patient has consented to conduct a Tele health visit and understands insurance will be billed.  ? ?Chief complaint:  ?Heart disease and heart failure management. ? ?71 y.o. Caucasian male who is chronically bedbound, type 2 diabetes, gastroparesis, hyperlipidemia, statin intolerance, CKD stage IIIa, history of DVT on Eliquis, severe peripheral neuropathy, nonhealing GI ulcers status post duodenal polyp clipping, CAD status post PCI, ischemic cardiomyopathy, chronic systolic and diastolic heart failure, sleep apnea on CPAP.  ? ?In March 2023 patient presented to the hospital with chest pain and ruled in for ACS.  He was taken to Cath Lab and found to have multivessel CAD.  Given multivessel CAD surgical revascularization would be ideal; however, because he is bedbound and inability to proceed with median sternotomy history decision was to proceed with high risk PCI.  Post coronary intervention echo noted reduced LVEF it was felt that it was likely due to myocardial stunning from no flow  phenomena during his recent angiography.  He was monitored closely in the ICU and required inotropic and vasopressor support, eventually his GDMT was uptitrated and discharged home. ? ?Shared decision was to arrange telephone visits to decrease the cost of medical transportation that the patient incurs to and from office visits (about $1440 each time). ? ?During the telephone encounter in March 2023 patient was started on Entresto 24/26 mg p.o. twice daily.  However due to soft blood pressures patient's wife was only giving him the medication intermittently.  Denzal has a home visiting physician who checks up on him and also has labs done periodically.  Labs collected on December 16, 2021 noted elevated NT proBNP of 4924 with no prior baseline for comparison.  And therefore he was recommended to hold spironolactone and Toprol-XL and start Entresto regularly and torsemide 10 mg every other day to help facilitate diuresis. ? ?During today's telephone encounter Mandela's wife provides majority of history of present illness. He is feeling better. No shortness of breath,chest pain, leg swelling. They have noticed his UOP has increased since the last medication changes. He is tolerating Entresto and Torsemide well. SBP ranges between 97-128mmHG and DBP 58-67mmHg and pulse 75-93bpm.  ? ?Past Medical History:  ?Diagnosis Date  ? Asthma   ? Cardiomyopathy ([place])   ? CHF (congestive heart failure) ([place])   ? Coronary artery disease   ? Diabetes mellitus without complication ([place])   ? History of DVT (deep vein thrombosis)   ? History of ulcer disease   ? Hyperlipidemia   ? Hypertension   ? Peripheral neuropathy   ? Sleep apnea   ? ? ?  Past Surgical History:  ?Procedure Laterality Date  ? BIOPSY  12/14/2020  ? Procedure: BIOPSY;  Surgeon: Jackquline Denmark, MD;  [facility]: [facility];  Service: Endoscopy;;  ? BIOPSY  09/05/2021  ? Procedure: BIOPSY;  Surgeon: Irving Copas., MD;  Location: [place];  Service:  Gastroenterology;;  ? CHOLECYSTECTOMY N/A 12/17/2020  ? Procedure: LAPAROSCOPIC CHOLECYSTECTOMY, LIVER BIOPSY, PRIMARY UMBILICAL HERNIA REPAIR;  Surgeon: Armandina [place], MD;  [facility]: [facility];  Service: General;  Laterality: N/A;  ? COLONOSCOPY WITH PROPOFOL N/A 05/08/2021  ? Procedure: COLONOSCOPY WITH PROPOFOL;  Surgeon: Carol Ada, MD;  [facility];  Service: Endoscopy;  Laterality: N/A;  ? CORONARY STENT INTERVENTION N/A 11/21/2021  ? Procedure: CORONARY STENT INTERVENTION;  Surgeon: Nigel Mormon, MD;  Location: [place] [facility];  Service: Cardiovascular;  Laterality: N/A;  ? ENDOSCOPIC RETROGRADE CHOLANGIOPANCREATOGRAPHY (ERCP) WITH PROPOFOL N/A 12/14/2020  ? Procedure: ENDOSCOPIC RETROGRADE CHOLANGIOPANCREATOGRAPHY (ERCP) WITH PROPOFOL;  Surgeon: Jackquline Denmark, MD;  [facility];  Service: Endoscopy;  Laterality: N/A;  ? ESOPHAGOGASTRODUODENOSCOPY N/A 09/05/2021  ? Procedure: ESOPHAGOGASTRODUODENOSCOPY (EGD);  Surgeon: Irving Copas., MD;  Location: [place];  Service: Gastroenterology;  Laterality: N/A;  ? HEMOSTASIS CLIP PLACEMENT  09/05/2021  ? Procedure: HEMOSTASIS CLIP PLACEMENT;  Surgeon: Irving Copas., MD;  Location: [place];  Service: Gastroenterology;;  ? INTRAVASCULAR ULTRASOUND/IVUS N/A 11/21/2021  ? Procedure: Intravascular Ultrasound/IVUS;  Surgeon: Nigel Mormon, MD;  Location: [place] [facility];  Service: Cardiovascular;  Laterality: N/A;  ? LEFT HEART CATH AND CORONARY ANGIOGRAPHY N/A 08/12/2021  ? Procedure: LEFT HEART CATH AND CORONARY ANGIOGRAPHY;  Surgeon: Adrian Prows, MD;  Location: [place] [facility];  Service: Cardiovascular;  Laterality: N/A;  ? LEFT HEART CATH AND CORONARY ANGIOGRAPHY N/A 11/21/2021  ? Procedure: LEFT HEART CATH AND CORONARY ANGIOGRAPHY;  Surgeon: Nigel Mormon, MD;  Location: [place] [facility];  Service: Cardiovascular;  Laterality: N/A;  ? POLYPECTOMY  05/08/2021  ? Procedure: POLYPECTOMY;  Surgeon:  Carol Ada, MD;  [facility];  Service: Endoscopy;;  ? POLYPECTOMY  09/05/2021  ? Procedure: POLYPECTOMY;  Surgeon: Rush Landmark Telford Nab., MD;  Location: [place];  Service: Gastroenterology;;  ? REMOVAL OF STONES  12/14/2020  ? Procedure: REMOVAL OF STONES;  Surgeon: Jackquline Denmark, MD;  [facility];  Service: Endoscopy;;  ? SPHINCTEROTOMY  12/14/2020  ? Procedure: SPHINCTEROTOMY;  Surgeon: Jackquline Denmark, MD;  Location[facility];  Service: Endoscopy;;  ? ? ?Social History  ? ?Socioeconomic History  ? Marital status: Married  ?  Spouse name: Not on file  ? Number of children: 0  ? Years of education: Not on file  ? Highest education level: Not on file  ?Occupational History  ? Not on file  ?Tobacco Use  ? Smoking status: Never  ? Smokeless tobacco: Never  ?Vaping Use  ? Vaping Use: Never used  ?Substance and Sexual Activity  ? Alcohol use: Not Currently  ? Drug use: Never  ? Sexual activity: Not on file  ?Other Topics Concern  ? Not on file  ?Social History Narrative  ? Not on file  ? ?Social Determinants of Health  ? ?Financial Resource Strain: Not on file  ?Food Insecurity: Not on file  ?Transportation Needs: Not on file  ?Physical Activity: Not on file  ?Stress: Not on file  ?Social Connections: Not on file  ?Intimate Partner Violence: Not on file  ? ? ?Family History  ?Problem Relation Age of Onset  ? Heart failure Mother   ?  Heart disease Mother   ? Cancer Father   ? ? ?Current Outpatient Medications on File Prior to Visit  ?Medication Sig Dispense Refill  ? acetaminophen (TYLENOL) 500 MG tablet Take 1,000 mg by mouth every 8 (eight) hours as needed for moderate pain.    ? Alpha-Lipoic Acid 300 MG TABS Take 600 mg by mouth daily.    ? benzonatate (TESSALON) 200 MG capsule Take 200 mg by mouth every 8 (eight) hours as needed for cough.    ? bisacodyl (DULCOLAX) 10 MG suppository Place 1 suppository (10 mg total) rectally daily as needed for up to 10 doses for moderate constipation.  10 suppository 0  ? Cholecalciferol 25 MCG (1000 UT) capsule Take 4,000 Units by mouth daily.    ? clopidogrel (PLAVIX) 75 MG tablet Take 1 tablet (75 mg total) by mouth daily. 30 tablet 2  ? colchicine-probenecid 0.5-500

## 2021-12-29 ENCOUNTER — Telehealth: Payer: Self-pay

## 2021-12-29 NOTE — Telephone Encounter (Signed)
Patient's PCP : Vinnie Langton, NP called and stated that she wants to know exactly what labs you wants drawn for patient, because he will be in to see her soon and does not want to miss getting these labs for you. Please advise.  ?

## 2021-12-29 NOTE — Telephone Encounter (Signed)
Ntprobnp, BMP and Mg.  ? ?They had check a NT-proBNP on April 4th via [facility] (that is order I would perfer) and not BNP.  ? ?Dr. Terri Skains

## 2021-12-30 NOTE — Telephone Encounter (Signed)
Called NP back, this has been taken care of.

## 2022-01-08 ENCOUNTER — Other Ambulatory Visit: Payer: Self-pay | Admitting: Cardiology

## 2022-01-08 DIAGNOSIS — I5022 Chronic systolic (congestive) heart failure: Secondary | ICD-10-CM

## 2022-01-15 ENCOUNTER — Telehealth: Payer: Self-pay

## 2022-01-15 NOTE — Telephone Encounter (Signed)
Ronica from [facility] called asking about pt lab orders. She mention pt wife called to inform him what they need to draw. Ronica asked moving forward if we could call they to let them know what to draw instead if the pt wife, due that pt wife is not sure what labs he needs. Please advise. ?

## 2022-01-21 NOTE — Telephone Encounter (Signed)
Randy Liu,  ?We discussed this already last week - he needs BMP, Mg level, and NT-proBNP.  ?Please get the contact information for Ms. Ronica so we can reach out to her as needed and provide close loop communication.  ? ?Dr. Odis Hollingshead

## 2022-01-22 ENCOUNTER — Encounter: Payer: Self-pay | Admitting: Cardiology

## 2022-01-22 ENCOUNTER — Ambulatory Visit: Payer: Medicare Other | Admitting: Cardiology

## 2022-01-22 VITALS — BP 103/59 | HR 68 | Ht 73.0 in | Wt 261.0 lb

## 2022-01-22 DIAGNOSIS — I2510 Atherosclerotic heart disease of native coronary artery without angina pectoris: Secondary | ICD-10-CM

## 2022-01-22 DIAGNOSIS — I5042 Chronic combined systolic (congestive) and diastolic (congestive) heart failure: Secondary | ICD-10-CM

## 2022-01-22 DIAGNOSIS — E7800 Pure hypercholesterolemia, unspecified: Secondary | ICD-10-CM

## 2022-01-22 DIAGNOSIS — I10 Essential (primary) hypertension: Secondary | ICD-10-CM

## 2022-01-22 DIAGNOSIS — E1169 Type 2 diabetes mellitus with other specified complication: Secondary | ICD-10-CM

## 2022-01-22 DIAGNOSIS — I255 Ischemic cardiomyopathy: Secondary | ICD-10-CM

## 2022-01-22 DIAGNOSIS — I252 Old myocardial infarction: Secondary | ICD-10-CM

## 2022-01-22 NOTE — Progress Notes (Signed)
? ?Telephone visit note ? ?Subjective:  ? ?Randy Liu, male    DOB: 1951-04-26, 71 y.o.   MRN: [id number] ? ?I connected with the patient on 01/22/22 by a telephone call and verified that I am speaking with the correct person using two identifiers.  ?   ?Unfortunately, virtual video visit could not be accomplished due to technical difficulties/lack of video enabled phone/computer. I discussed the limitations of evaluation and management by telemedicine and the availability of in person appointments. The patient and wife expressed understanding and agreed to proceed.  ? ?This visit type was conducted as the patient is predominantly bedbound and medical transportation to and from the medical office building is expensive for the patient and his family.  This format is felt to be most appropriate for this patient at this time.  All issues noted in this document were discussed and addressed.  No physical exam was performed. ?The patient has consented to conduct a Tele health visit and understands insurance will be billed.  ? ?Chief complaint:  ?CAD and heart failure management and discuss test results ? ?71 y.o. Caucasian male who is chronically bedbound, type 2 diabetes, gastroparesis, hyperlipidemia, statin intolerance, CKD stage IIIa, history of DVT on Eliquis, severe peripheral neuropathy, nonhealing GI ulcers status post duodenal polyp clipping, CAD status post PCI, ischemic cardiomyopathy, chronic systolic and diastolic heart failure, sleep apnea on CPAP.  ? ?In March 2023 patient presented to the hospital and ruled in for ACS.  He underwent angiography and found to have multivessel CAD.  Given multivessel CAD surgical revascularization would be ideal; however, because he is bedbound and inability to proceed with median sternotomy and the shared decision was to proceed with high risk PCI.  Post coronary intervention echo noted reduced LVEF it was felt that it was likely due to myocardial stunning from no  flow phenomena during his recent angiography.  He was monitored closely in the ICU and required inotropic and vasopressor support, eventually his GDMT was uptitrated and discharged home. ? ?Shared decision was to arrange telephone visits to decrease the cost of medical transportation that the patient incurs to and from office visits (about $1440 each time). ? ?Since last office visit patient is doing well from a cardiovascular standpoint.  He denies angina pectoris, weight gain, orthopnea, paroxysmal nocturnal dyspnea or lower extremity swelling.  He was recently evaluated by his primary care provider Dr. Tommi Rumps who informed the patient's wife that his lungs sound clear, no significant swelling in the lower extremities, and heart sounds are regular. ? ?During last encounter we added Toprol-XL 12.5 mg p.o. daily which she is tolerating well.  Heart rate is usually between 60-70 bpm.  At home systolic blood pressures range between 93-125 mmHg. ? ?Past Medical History:  ?Diagnosis Date  ? Asthma   ? Cardiomyopathy ([place])   ? CHF (congestive heart failure) ([place])   ? Coronary artery disease   ? Diabetes mellitus without complication ([place])   ? History of DVT (deep vein thrombosis)   ? History of ulcer disease   ? Hyperlipidemia   ? Hypertension   ? Peripheral neuropathy   ? Sleep apnea   ? ? ?Past Surgical History:  ?Procedure Laterality Date  ? BIOPSY  12/14/2020  ? Procedure: BIOPSY;  Surgeon: Jackquline Denmark, MD;  [facility];  Service: Endoscopy;;  ? BIOPSY  09/05/2021  ? Procedure: BIOPSY;  Surgeon: Irving Copas., MD;  Location: [facility];  Service: Gastroenterology;;  ? CHOLECYSTECTOMY N/A 12/17/2020  ?  Procedure: LAPAROSCOPIC CHOLECYSTECTOMY, LIVER BIOPSY, PRIMARY UMBILICAL HERNIA REPAIR;  Surgeon: Armandina [facility], MD;  [facility];  Service: General;  Laterality: N/A;  ? COLONOSCOPY WITH PROPOFOL N/A 05/08/2021  ? Procedure: COLONOSCOPY WITH PROPOFOL;  Surgeon: Carol Ada, MD;  [facility];  Service: Endoscopy;  Laterality: N/A;  ? CORONARY STENT INTERVENTION N/A 11/21/2021  ? Procedure: CORONARY STENT INTERVENTION;  Surgeon: Nigel Mormon, MD;  Location: [place] [facility];  Service: Cardiovascular;  Laterality: N/A;  ? ENDOSCOPIC RETROGRADE CHOLANGIOPANCREATOGRAPHY (ERCP) WITH PROPOFOL N/A 12/14/2020  ? Procedure: ENDOSCOPIC RETROGRADE CHOLANGIOPANCREATOGRAPHY (ERCP) WITH PROPOFOL;  Surgeon: Jackquline Denmark, MD;  [facility];  Service: Endoscopy;  Laterality: N/A;  ? ESOPHAGOGASTRODUODENOSCOPY N/A 09/05/2021  ? Procedure: ESOPHAGOGASTRODUODENOSCOPY (EGD);  Surgeon: Irving Copas., MD;  Location: [facility];  Service: Gastroenterology;  Laterality: N/A;  ? HEMOSTASIS CLIP PLACEMENT  09/05/2021  ? Procedure: HEMOSTASIS CLIP PLACEMENT;  Surgeon: Irving Copas., MD;  Location: [facility];  Service: Gastroenterology;;  ? INTRAVASCULAR ULTRASOUND/IVUS N/A 11/21/2021  ? Procedure: Intravascular Ultrasound/IVUS;  Surgeon: Nigel Mormon, MD;  Location: [facility] [facility];  Service: Cardiovascular;  Laterality: N/A;  ? LEFT HEART CATH AND CORONARY ANGIOGRAPHY N/A 08/12/2021  ? Procedure: LEFT HEART CATH AND CORONARY ANGIOGRAPHY;  Surgeon: Adrian Prows, MD;  Location: [place] [facility];  Service: Cardiovascular;  Laterality: N/A;  ? LEFT HEART CATH AND CORONARY ANGIOGRAPHY N/A 11/21/2021  ? Procedure: LEFT HEART CATH AND CORONARY ANGIOGRAPHY;  Surgeon: Nigel Mormon, MD;  Location: [place] [facility];  Service: Cardiovascular;  Laterality: N/A;  ? POLYPECTOMY  05/08/2021  ? Procedure: POLYPECTOMY;  Surgeon: Carol Ada, MD;  [facility];  Service: Endoscopy;;  ? POLYPECTOMY  09/05/2021  ? Procedure: POLYPECTOMY;  Surgeon: Rush Landmark Telford Nab., MD;  Location: [facility];  Service: Gastroenterology;;  ? REMOVAL OF STONES  12/14/2020  ? Procedure: REMOVAL OF STONES;  Surgeon: Jackquline Denmark, MD;  [facility];  Service: Endoscopy;;  ?  SPHINCTEROTOMY  12/14/2020  ? Procedure: SPHINCTEROTOMY;  Surgeon: Jackquline Denmark, MD;  Location[facility];  Service: Endoscopy;;  ? ? ?Social History  ? ?Socioeconomic History  ? Marital status: Married  ?  Spouse name: Not on file  ? Number of children: 0  ? Years of education: Not on file  ? Highest education level: Not on file  ?Occupational History  ? Not on file  ?Tobacco Use  ? Smoking status: Never  ? Smokeless tobacco: Never  ?Vaping Use  ? Vaping Use: Never used  ?Substance and Sexual Activity  ? Alcohol use: Not Currently  ? Drug use: Never  ? Sexual activity: Not on file  ?Other Topics Concern  ? Not on file  ?Social History Narrative  ? Not on file  ? ?Social Determinants of Health  ? ?Financial Resource Strain: Not on file  ?Food Insecurity: Not on file  ?Transportation Needs: Not on file  ?Physical Activity: Not on file  ?Stress: Not on file  ?Social Connections: Not on file  ?Intimate Partner Violence: Not on file  ? ? ?Family History  ?Problem Relation Age of Onset  ? Heart failure Mother   ? Heart disease Mother   ? Cancer Father   ? ? ?Current Outpatient Medications on File Prior to Visit  ?Medication Sig Dispense Refill  ? acetaminophen (TYLENOL) 500 MG tablet Take 1,000 mg by mouth every 8 (eight) hours as needed for moderate pain.    ? Alpha-Lipoic Acid 300 MG  TABS Take 600 mg by mouth daily.    ? benzonatate (TESSALON) 200 MG capsule Take 200 mg by mouth every 8 (eight) hours as needed for cough.    ? bisacodyl (DULCOLAX) 10 MG suppository Place 1 suppository (10 mg total) rectally daily as needed for up to 10 doses for moderate constipation. 10 suppository 0  ? Cholecalciferol 25 MCG (1000 UT) capsule Take 4,000 Units by mouth daily.    ? clopidogrel (PLAVIX) 75 MG tablet Take 75 mg by mouth daily.    ? colchicine-probenecid 0.5-500 MG tablet Take 1 tablet by mouth daily.    ? cyanocobalamin (,VITAMIN B-12,) 1000 MCG/ML injection Inject 1,000 mcg into the skin every 30 (thirty) days.    ?  ELIQUIS 5 MG TABS tablet Take 1 tablet (5 mg total) by mouth 2 (two) times daily. Resume on 05/11/21 (Patient taking differently: Take 5 mg by mouth 2 (two) times daily.) 60 tablet   ? ENTRESTO 24-26 MG TAKE

## 2022-01-26 ENCOUNTER — Encounter (HOSPITAL_COMMUNITY): Payer: Self-pay

## 2022-01-26 ENCOUNTER — Emergency Department (HOSPITAL_COMMUNITY)
Admission: EM | Admit: 2022-01-26 | Discharge: 2022-01-27 | Disposition: A | Discharge status: Home / Self Care | Payer: Medicare Other | Attending: Emergency Medicine | Admitting: Emergency Medicine

## 2022-01-26 ENCOUNTER — Emergency Department (HOSPITAL_COMMUNITY): Payer: Medicare Other

## 2022-01-26 DIAGNOSIS — I2510 Atherosclerotic heart disease of native coronary artery without angina pectoris: Secondary | ICD-10-CM | POA: Diagnosis not present

## 2022-01-26 DIAGNOSIS — E875 Hyperkalemia: Secondary | ICD-10-CM | POA: Diagnosis not present

## 2022-01-26 DIAGNOSIS — D72829 Elevated white blood cell count, unspecified: Secondary | ICD-10-CM | POA: Diagnosis not present

## 2022-01-26 DIAGNOSIS — I509 Heart failure, unspecified: Secondary | ICD-10-CM | POA: Diagnosis not present

## 2022-01-26 DIAGNOSIS — N189 Chronic kidney disease, unspecified: Secondary | ICD-10-CM | POA: Diagnosis not present

## 2022-01-26 DIAGNOSIS — J45909 Unspecified asthma, uncomplicated: Secondary | ICD-10-CM | POA: Diagnosis not present

## 2022-01-26 DIAGNOSIS — Z794 Long term (current) use of insulin: Secondary | ICD-10-CM | POA: Insufficient documentation

## 2022-01-26 DIAGNOSIS — R600 Localized edema: Secondary | ICD-10-CM | POA: Diagnosis not present

## 2022-01-26 DIAGNOSIS — D631 Anemia in chronic kidney disease: Secondary | ICD-10-CM | POA: Diagnosis not present

## 2022-01-26 DIAGNOSIS — R109 Unspecified abdominal pain: Secondary | ICD-10-CM | POA: Diagnosis not present

## 2022-01-26 DIAGNOSIS — Z7901 Long term (current) use of anticoagulants: Secondary | ICD-10-CM | POA: Diagnosis not present

## 2022-01-26 DIAGNOSIS — R112 Nausea with vomiting, unspecified: Secondary | ICD-10-CM | POA: Diagnosis present

## 2022-01-26 DIAGNOSIS — R197 Diarrhea, unspecified: Secondary | ICD-10-CM | POA: Diagnosis not present

## 2022-01-26 DIAGNOSIS — E860 Dehydration: Secondary | ICD-10-CM | POA: Insufficient documentation

## 2022-01-26 DIAGNOSIS — E1122 Type 2 diabetes mellitus with diabetic chronic kidney disease: Secondary | ICD-10-CM | POA: Diagnosis not present

## 2022-01-26 DIAGNOSIS — I130 Hypertensive heart and chronic kidney disease with heart failure and stage 1 through stage 4 chronic kidney disease, or unspecified chronic kidney disease: Secondary | ICD-10-CM | POA: Diagnosis not present

## 2022-01-26 DIAGNOSIS — Z79899 Other long term (current) drug therapy: Secondary | ICD-10-CM | POA: Insufficient documentation

## 2022-01-26 HISTORY — DX: Anal fissure, unspecified: K60.2

## 2022-01-26 LAB — CBC WITH DIFFERENTIAL/PLATELET
Abs Immature Granulocytes: 0.04 10*3/uL (ref 0.00–0.07)
Basophils Absolute: 0 10*3/uL (ref 0.0–0.1)
Basophils Relative: 0 %
Eosinophils Absolute: 0.1 10*3/uL (ref 0.0–0.5)
Eosinophils Relative: 1 %
HCT: 38.6 % — ABNORMAL LOW (ref 39.0–52.0)
Hemoglobin: 12 g/dL — ABNORMAL LOW (ref 13.0–17.0)
Immature Granulocytes: 0 %
Lymphocytes Relative: 10 %
Lymphs Abs: 1.2 10*3/uL (ref 0.7–4.0)
MCH: 30.3 pg (ref 26.0–34.0)
MCHC: 31.1 g/dL (ref 30.0–36.0)
MCV: 97.5 fL (ref 80.0–100.0)
Monocytes Absolute: 1 10*3/uL (ref 0.1–1.0)
Monocytes Relative: 8 %
Neutro Abs: 9.7 10*3/uL — ABNORMAL HIGH (ref 1.7–7.7)
Neutrophils Relative %: 81 %
Platelets: 299 10*3/uL (ref 150–400)
RBC: 3.96 MIL/uL — ABNORMAL LOW (ref 4.22–5.81)
RDW: 15 % (ref 11.5–15.5)
WBC: 12.1 10*3/uL — ABNORMAL HIGH (ref 4.0–10.5)
nRBC: 0 % (ref 0.0–0.2)

## 2022-01-26 LAB — COMPREHENSIVE METABOLIC PANEL
ALT: 18 U/L (ref 0–44)
AST: 30 U/L (ref 15–41)
Albumin: 2.8 g/dL — ABNORMAL LOW (ref 3.5–5.0)
Alkaline Phosphatase: 34 U/L — ABNORMAL LOW (ref 38–126)
Anion gap: 10 (ref 5–15)
BUN: 34 mg/dL — ABNORMAL HIGH (ref 8–23)
CO2: 21 mmol/L — ABNORMAL LOW (ref 22–32)
Calcium: 8.4 mg/dL — ABNORMAL LOW (ref 8.9–10.3)
Chloride: 105 mmol/L (ref 98–111)
Creatinine, Ser: 1.64 mg/dL — ABNORMAL HIGH (ref 0.61–1.24)
GFR, Estimated: 44 mL/min — ABNORMAL LOW (ref >60)
Glucose, Bld: 185 mg/dL — ABNORMAL HIGH (ref 70–99)
Potassium: 5.4 mmol/L — ABNORMAL HIGH (ref 3.5–5.1)
Sodium: 136 mmol/L (ref 135–145)
Total Bilirubin: 1.6 mg/dL — ABNORMAL HIGH (ref 0.3–1.2)
Total Protein: 5.9 g/dL — ABNORMAL LOW (ref 6.5–8.1)

## 2022-01-26 LAB — LIPASE, BLOOD: Lipase: 22 U/L (ref 11–51)

## 2022-01-26 LAB — MAGNESIUM: Magnesium: 2.2 mg/dL (ref 1.7–2.4)

## 2022-01-26 MED ORDER — LACTATED RINGERS IV BOLUS
500.0000 mL | Freq: Once | INTRAVENOUS | Status: AC
  Administered 2022-01-26: 500 mL via INTRAVENOUS

## 2022-01-26 MED ORDER — MECLIZINE HCL 25 MG PO TABS
25.0000 mg | ORAL_TABLET | Freq: Once | ORAL | Status: AC
  Administered 2022-01-26: 25 mg via ORAL
  Filled 2022-01-26: qty 1

## 2022-01-26 NOTE — ED Triage Notes (Signed)
Pt c/o NVD x3 days, pts wife concerned with bright red blood in stool, hx fissures, on blood thinners. Pt received 4mg  zofran  and NS en route ?

## 2022-01-26 NOTE — ED Notes (Signed)
Patient transported to CT 

## 2022-01-26 NOTE — ED Provider Notes (Signed)
?[place] ?Provider Note ? ? ?CSN: [id number] ?Arrival date & time: 01/26/22  1823 ? ?  ? ?History ? ?Chief Complaint  ?Patient presents with  ? Emesis  ? Diarrhea  ? ? ?Randy Liu is a 71 y.o. male. ? ? ?Emesis ?Associated symptoms: abdominal pain and diarrhea   ?Diarrhea ?Associated symptoms: abdominal pain and vomiting   ?Patient presenting for nausea, vomiting, and diarrhea for the past 3 days.  Medical history includes asthma, diabetes, SBO, CAD, HLD, PE, CKD, HTN, CHF, gastroparesis, constipation, and anal fissures.  He is chronically bedbound.  History is provided by both patient and his wife, who he lives with.  Both of them had symptoms of enteritis over the weekend.  Patient had intermittent vomiting and diarrhea in the past 2 days.  Early this morning, at around 4 AM, he had multiple episodes of diarrhea.  He was given a nausea medication as well as some Imodium.  Around lunchtime, he did eat some protein drinks and protein bar.  After that, he had worsened nausea and vomiting.  He also had severe volume and frequency of diarrhea.  During this most recent episode of diarrhea, there were some red streaks in his stool.  Due to the severity of the diarrhea, patient's wife called 911.  She is concerned of dehydration.  Currently, patient does take Plavix and Eliquis.  Patient was given a small bolus of IV fluids and 4 mg of Zofran prior to arrival.  Currently, he denies any nausea.  He states that he has some generalized abdominal soreness.  He also states that his chronic vertigo has worsened with his recent symptoms. ?  ? ?Home Medications ?Prior to Admission medications   ?Medication Sig Start Date End Date Taking? Authorizing Provider  ?acetaminophen (TYLENOL) 500 MG tablet Take 1,000 mg by mouth every 8 (eight) hours as needed for moderate pain.    [provider]  ?Alpha-Lipoic Acid 300 MG TABS Take 600 mg by mouth daily.    [provider]  ?benzonatate (TESSALON) 200 MG capsule Take 200 mg by mouth every 8 (eight) hours as needed for cough. 11/18/20   [provider]  ?bisacodyl (DULCOLAX) 10 MG suppository Place 1 suppository (10 mg total) rectally daily as needed for up to 10 doses for moderate constipation. 05/06/21   Antonieta Pert, MD  ?Cholecalciferol 25 MCG (1000 UT) capsule Take 4,000 Units by mouth daily.    [provider]  ?clopidogrel (PLAVIX) 75 MG tablet Take 75 mg by mouth daily. 01/15/22   [provider]  ?colchicine-probenecid 0.5-500 MG tablet Take 1 tablet by mouth daily. 11/21/20   [provider]  ?cyanocobalamin (,VITAMIN B-12,) 1000 MCG/ML injection Inject 1,000 mcg into the skin every 30 (thirty) days. 09/23/20   [provider]  ?ELIQUIS 5 MG TABS tablet Take 1 tablet (5 mg total) by mouth 2 (two) times daily. Resume on 05/11/21 ?Patient taking differently: Take 5 mg by mouth 2 (two) times daily. 05/11/21   Antonieta Pert, MD  ?Delene Loll 24-26 MG TAKE ONE TABLET BY MOUTH TWICE DAILY 01/08/22   Rex Kras, DO  ?Evolocumab (REPATHA SURECLICK) [id number] MG/ML SOAJ Inject 1 mL into the skin every 14 (fourteen) days. 08/27/21   Adrian Prows, MD  ?fenofibrate 160 MG tablet Take 160 mg by mouth daily. 12/04/20   [provider]  ?fluticasone (FLONASE) 50 MCG/ACT nasal spray Place 1 spray into both nostrils as needed. 07/08/21   [provider]  ?  latanoprost (XALATAN) 0.005 % ophthalmic solution Place 1 drop into the left eye at bedtime. 10/29/20   [provider]  ?loratadine (CLARITIN) 10 MG tablet Take 1 tablet by mouth daily. 01/20/22   [provider]  ?magnesium oxide (MAG-OX) 400 MG tablet Take 400 mg by mouth 2 (two) times daily. 10/09/20   [provider]  ?meclizine (ANTIVERT) 25 MG tablet Take 25 mg by mouth 3 (three) times daily as needed for nausea.    [provider]  ?Melatonin 5 MG CHEW Chew 1 tablet by mouth daily. 01/20/22   [provider]  ?metoCLOPramide (REGLAN) 5 MG tablet Take 2.5 mg by mouth at bedtime. 10/09/20   [provider]  ?metoprolol succinate (TOPROL-XL) 25 MG 24 hr tablet Take 12.5 mg by mouth daily. 12/31/21   [provider]  ?montelukast (SINGULAIR) 10 MG tablet Take 10 mg by mouth at bedtime. 09/28/20   [provider]  ?nitroGLYCERIN (NITROSTAT) 0.4 MG SL tablet Place 0.4 mg under the tongue every 5 (five) minutes as needed for chest pain.    [provider]  ?NOVOLOG MIX 70/30 FLEXPEN (70-30) 100 UNIT/ML FlexPen Inject 30 Units into the skin 2 (two) times daily with a meal. ?Patient taking differently: Inject 40-85 Units into the skin 2 (two) times daily with a meal. Sliding scale 05/09/21   Antonieta Pert, MD  ?ondansetron (ZOFRAN-ODT) 4 MG disintegrating tablet Take 1 tablet (4 mg total) by mouth every 8 (eight) hours as needed for nausea or vomiting. 09/05/21   Sharen Hones, MD  ?pantoprazole (PROTONIX) 40 MG tablet Take 1 tablet (40 mg total) by mouth 2 (two) times daily. 09/05/21   Sharen Hones, MD  ?polyethylene glycol (MIRALAX / GLYCOLAX) 17 g packet Take 17 g by mouth 2 (two) times daily. ?Patient taking differently: Take 17 g by mouth as needed. 11/27/21   Pokhrel, Corrie Mckusick, MD  ?Polyethylene Glycol 400 (BLINK TEARS) 0.25 % SOLN Place 1 drop into both eyes daily as needed (dry eyes).    [provider]  ?PROAIR RESPICLICK 123XX123 (90 Base) MCG/ACT AEPB Inhale 2 puffs into the lungs every 6 (six) hours as needed (sob/wheezing). 04/03/21   [provider]  ?Probiotic Product (ALIGN) 4 MG CAPS Take 4 mg by mouth daily.    [provider]  ?senna-docusate (SENOKOT-S) 8.6-50 MG tablet Take 1 tablet by mouth daily.    [provider]  ?sodium chloride (MURO 128) 5 % ophthalmic ointment Place 1 application. into both eyes as needed.    [provider]  ?sucralfate (CARAFATE) 1 GM/10ML suspension Take 10 mLs (1 g total) by mouth 2 (two) times daily. 09/05/21    Sharen Hones, MD  ?torsemide (DEMADEX) 10 MG tablet Take 1 tablet (10 mg total) by mouth every other day for 30 doses. 12/17/21 02/14/22  Tolia, Sunit, DO  ?VASCEPA 1 g capsule Take 1 capsule (1 g total) by mouth 2 (two) times daily. 12/05/21 03/05/22  Rex Kras, DO  ?   ? ?Allergies    ?Metoclopramide, Nsaids, Amoxicillin-pot clavulanate, Statins, Allopurinol, Empagliflozin, and Tiotropium   ? ?Review of Systems   ?Review of Systems  ?Gastrointestinal:  Positive for abdominal pain, blood in stool, diarrhea, nausea and vomiting.  ?Neurological:  Positive for dizziness.  ?Hematological:  Bruises/bleeds easily (On Eliquis).  ? ?Physical Exam ?Updated Vital Signs ?BP 109/78   Pulse 86   Temp 97.6 ?F (36.4 ?C) (Oral)   Resp 10   SpO2 96%  ?Physical  Exam ?Vitals and nursing note reviewed. Exam conducted with a chaperone present.  ?Constitutional:   ?   General: He is not in acute distress. ?   Appearance: He is well-developed. He is obese. He is ill-appearing (Chronically). He is not toxic-appearing or diaphoretic.  ?HENT:  ?   Head: Normocephalic and atraumatic.  ?   Right Ear: External ear normal.  ?   Left Ear: External ear normal.  ?   Nose: Nose normal.  ?   Mouth/Throat:  ?   Mouth: Mucous membranes are moist.  ?   Pharynx: Oropharynx is clear.  ?Eyes:  ?   General: No scleral icterus. ?   Extraocular Movements: Extraocular movements intact.  ?   Conjunctiva/sclera: Conjunctivae normal.  ?Cardiovascular:  ?   Rate and Rhythm: Normal rate and regular rhythm.  ?   Heart sounds: No murmur heard. ?Pulmonary:  ?   Effort: Pulmonary effort is normal. No respiratory distress.  ?Abdominal:  ?   Palpations: Abdomen is soft.  ?   Tenderness: There is abdominal tenderness (Bilateral lower flanks). There is no guarding or rebound.  ?Genitourinary: ?   Comments: Small fissure at 6 o'clock position, no active bleeding, no external or internal hemorrhoids, no blood or melena on DRE ?Musculoskeletal:     ?   General: No swelling.   ?   Cervical back: Normal range of motion and neck supple.  ?   Right lower leg: Edema present.  ?   Left lower leg: Edema present.  ?Skin: ?   General: Skin is warm and dry.  ?   Capillary Refill: Cap

## 2022-01-27 ENCOUNTER — Other Ambulatory Visit: Payer: Self-pay | Admitting: Pharmacist

## 2022-01-27 ENCOUNTER — Telehealth: Payer: Self-pay | Admitting: Pharmacist

## 2022-01-27 DIAGNOSIS — R112 Nausea with vomiting, unspecified: Secondary | ICD-10-CM | POA: Diagnosis not present

## 2022-01-27 DIAGNOSIS — I5042 Chronic combined systolic (congestive) and diastolic (congestive) heart failure: Secondary | ICD-10-CM

## 2022-01-27 LAB — URINALYSIS, ROUTINE W REFLEX MICROSCOPIC
Bilirubin Urine: NEGATIVE
Glucose, UA: NEGATIVE mg/dL
Hgb urine dipstick: NEGATIVE
Ketones, ur: 5 mg/dL — AB
Leukocytes,Ua: NEGATIVE
Nitrite: NEGATIVE
Protein, ur: NEGATIVE mg/dL
Specific Gravity, Urine: 1.019 (ref 1.005–1.030)
pH: 5 (ref 5.0–8.0)

## 2022-01-27 MED ORDER — METOCLOPRAMIDE HCL 5 MG PO TABS
2.5000 mg | ORAL_TABLET | Freq: Every day | ORAL | Status: DC
  Administered 2022-01-27: 2.5 mg via ORAL
  Filled 2022-01-27: qty 0.5

## 2022-01-27 MED ORDER — MONTELUKAST SODIUM 10 MG PO TABS
10.0000 mg | ORAL_TABLET | Freq: Every day | ORAL | Status: DC
  Administered 2022-01-27: 10 mg via ORAL
  Filled 2022-01-27: qty 1

## 2022-01-27 MED ORDER — PANTOPRAZOLE SODIUM 40 MG PO TBEC
40.0000 mg | DELAYED_RELEASE_TABLET | Freq: Two times a day (BID) | ORAL | Status: DC
Start: 2022-01-27 — End: 2022-01-27
  Administered 2022-01-27: 40 mg via ORAL
  Filled 2022-01-27: qty 1

## 2022-01-27 MED ORDER — SODIUM ZIRCONIUM CYCLOSILICATE 10 G PO PACK
10.0000 g | PACK | Freq: Once | ORAL | Status: AC
  Administered 2022-01-27: 10 g via ORAL
  Filled 2022-01-27: qty 1

## 2022-01-27 MED ORDER — APIXABAN 5 MG PO TABS
5.0000 mg | ORAL_TABLET | Freq: Two times a day (BID) | ORAL | Status: DC
  Administered 2022-01-27: 5 mg via ORAL
  Filled 2022-01-27: qty 1

## 2022-01-27 MED ORDER — SACUBITRIL-VALSARTAN 24-26 MG PO TABS
1.0000 | ORAL_TABLET | Freq: Two times a day (BID) | ORAL | Status: DC
  Administered 2022-01-27: 1 via ORAL
  Filled 2022-01-27: qty 1

## 2022-01-27 NOTE — ED Notes (Signed)
Ptar called 

## 2022-01-27 NOTE — Telephone Encounter (Signed)
Future lab orders to be faxed to [phone] for future requests  ?ATTN: [name], NP and Laurence Compton ? ? ?

## 2022-01-27 NOTE — Telephone Encounter (Signed)
Has been done! 

## 2022-01-27 NOTE — Discharge Instructions (Signed)
Continue to take your home antinausea medicines as needed.  Stay hydrated.  Return to the emergency department at any time for any new or worsening symptoms. ?

## 2022-02-06 ENCOUNTER — Ambulatory Visit: Payer: Medicare Other | Admitting: Cardiology

## 2022-02-06 ENCOUNTER — Encounter: Payer: Self-pay | Admitting: Cardiology

## 2022-02-06 VITALS — BP 106/54 | HR 73 | Resp 16 | Ht 73.0 in | Wt 261.0 lb

## 2022-02-06 DIAGNOSIS — I2510 Atherosclerotic heart disease of native coronary artery without angina pectoris: Secondary | ICD-10-CM

## 2022-02-06 DIAGNOSIS — E7800 Pure hypercholesterolemia, unspecified: Secondary | ICD-10-CM

## 2022-02-06 DIAGNOSIS — I5042 Chronic combined systolic (congestive) and diastolic (congestive) heart failure: Secondary | ICD-10-CM

## 2022-02-06 DIAGNOSIS — I255 Ischemic cardiomyopathy: Secondary | ICD-10-CM

## 2022-02-06 DIAGNOSIS — E1169 Type 2 diabetes mellitus with other specified complication: Secondary | ICD-10-CM

## 2022-02-06 DIAGNOSIS — I10 Essential (primary) hypertension: Secondary | ICD-10-CM

## 2022-02-06 DIAGNOSIS — I252 Old myocardial infarction: Secondary | ICD-10-CM

## 2022-02-06 DIAGNOSIS — Z86711 Personal history of pulmonary embolism: Secondary | ICD-10-CM

## 2022-02-06 NOTE — Progress Notes (Signed)
Telephone visit note  Subjective:   Randy Liu, male    DOB: 10/24/1950, 71 y.o.   MRN: [id number]  I connected with the patient on 02/06/22 by a telephone call and verified that I am speaking with the correct person using two identifiers.     Virtual video visit could not be accomplished due to technical difficulties/lack of video enabled phone/computer. I discussed the limitations of evaluation and management by telemedicine and the availability of in person appointments. The patient and wife expressed understanding and agreed to proceed.   This visit type was conducted as the patient is predominantly bedbound and medical transportation to and from the medical office building is expensive for the patient and his family.  This format is felt to be most appropriate for this patient at this time.  All issues noted in this document were discussed and addressed.  No physical exam was performed. The patient has consented to conduct a Tele health visit and understands insurance will be billed.   Chief complaint:  CAD follow-up and discuss test results  71 y.o. Caucasian male who is chronically bedbound, type 2 diabetes, gastroparesis, hyperlipidemia, statin intolerance, CKD stage IIIa, history of DVT on Eliquis, severe peripheral neuropathy, nonhealing GI ulcers status post duodenal polyp clipping, CAD status post PCI, ischemic cardiomyopathy, chronic systolic and diastolic heart failure, sleep apnea on CPAP.   In March 2023 patient presented to the hospital and ruled in for ACS.  He underwent angiography and found to have multivessel CAD.  Given multivessel CAD surgical revascularization would be ideal; however, because he is bedbound and inability to proceed with median sternotomy and the shared decision was to proceed with high risk PCI.  Post coronary intervention echo noted reduced LVEF it was felt that it was likely due to myocardial stunning from no flow phenomena during his recent  angiography.  He was monitored closely in the ICU and required inotropic and vasopressor support, eventually his GDMT was uptitrated and discharged home.  Because of medical transportation to and from the office is an approximate $1440 and therefore to make the care more affordable telephone encounters have been performed in the recent past.  At the last office visit patient was doing well from a cardiovascular standpoint.  The shared decision was to change his torsemide from Monday Wednesday Friday to 5 mg p.o. daily.  He has tolerated the change well without any side effects or intolerances.  Unfortunately he had a stomach virus for which she had gone to the ED in the recent past documentation reviewed as part of today's visit.  Patient was evaluated this past week by his home supervising physician Dr. Tommi Rumps and based on my discussion with his wife no new cardiac findings were noted on physical examination and in fact he was progressing well.  Recent labs from Care Everywhere reviewed NT proBNP continues to downtrend and renal function remains stable.  Patient denies anginal discomfort or heart failure symptoms.  Past Medical History:  Diagnosis Date   Anal fissure    Asthma    Cardiomyopathy ([place])    CHF (congestive heart failure) (HCC)    Coronary artery disease    Diabetes mellitus without complication ([place])    History of DVT (deep vein thrombosis)    History of ulcer disease    Hyperlipidemia    Hypertension    Peripheral neuropathy    Sleep apnea     Past Surgical History:  Procedure Laterality Date   BIOPSY  12/14/2020  Procedure: BIOPSY;  Surgeon: Jackquline Denmark, MD;  Location: [facility];  Service: Endoscopy;;   BIOPSY  09/05/2021   Procedure: BIOPSY;  Surgeon: Irving Copas., MD;  Location: [facility];  Service: Gastroenterology;;   CHOLECYSTECTOMY N/A 12/17/2020   Procedure: LAPAROSCOPIC CHOLECYSTECTOMY, LIVER BIOPSY, PRIMARY UMBILICAL HERNIA REPAIR;  Surgeon:  Armandina Gemma, MD;  Location: [facility];  Service: General;  Laterality: N/A;   COLONOSCOPY WITH PROPOFOL N/A 05/08/2021   Procedure: COLONOSCOPY WITH PROPOFOL;  Surgeon: Carol Ada, MD;  Location: [facility];  Service: Endoscopy;  Laterality: N/A;   CORONARY STENT INTERVENTION N/A 11/21/2021   Procedure: CORONARY STENT INTERVENTION;  Surgeon: Nigel Mormon, MD;  Location: [facility];  Service: Cardiovascular;  Laterality: N/A;   ENDOSCOPIC RETROGRADE CHOLANGIOPANCREATOGRAPHY (ERCP) WITH PROPOFOL N/A 12/14/2020   Procedure: ENDOSCOPIC RETROGRADE CHOLANGIOPANCREATOGRAPHY (ERCP) WITH PROPOFOL;  Surgeon: Jackquline Denmark, MD;  Location: [facility];  Service: Endoscopy;  Laterality: N/A;   ESOPHAGOGASTRODUODENOSCOPY N/A 09/05/2021   Procedure: ESOPHAGOGASTRODUODENOSCOPY (EGD);  Surgeon: Irving Copas., MD;  Location: [facility];  Service: Gastroenterology;  Laterality: N/A;   HEMOSTASIS CLIP PLACEMENT  09/05/2021   Procedure: HEMOSTASIS CLIP PLACEMENT;  Surgeon: Irving Copas., MD;  Location: [facility];  Service: Gastroenterology;;   INTRAVASCULAR ULTRASOUND/IVUS N/A 11/21/2021   Procedure: Intravascular Ultrasound/IVUS;  Surgeon: Nigel Mormon, MD;  Location: [facility];  Service: Cardiovascular;  Laterality: N/A;   LEFT HEART CATH AND CORONARY ANGIOGRAPHY N/A 08/12/2021   Procedure: LEFT HEART CATH AND CORONARY ANGIOGRAPHY;  Surgeon: Adrian Prows, MD;  Location: [facility];  Service: Cardiovascular;  Laterality: N/A;   LEFT HEART CATH AND CORONARY ANGIOGRAPHY N/A 11/21/2021   Procedure: LEFT HEART CATH AND CORONARY ANGIOGRAPHY;  Surgeon: Nigel Mormon, MD;  Location: [facility];  Service: Cardiovascular;  Laterality: N/A;   POLYPECTOMY  05/08/2021   Procedure: POLYPECTOMY;  Surgeon: Carol Ada, MD;  Location: [facility];  Service: Endoscopy;;   POLYPECTOMY  09/05/2021   Procedure: POLYPECTOMY;  Surgeon: Irving Copas.,  MD;  Location: [facility];  Service: Gastroenterology;;   REMOVAL OF STONES  12/14/2020   Procedure: REMOVAL OF STONES;  Surgeon: Jackquline Denmark, MD;  Location: [facility];  Service: Endoscopy;;   SPHINCTEROTOMY  12/14/2020   Procedure: Joan Mayans;  Surgeon: Jackquline Denmark, MD;  Location: [facility];  Service: Endoscopy;;    Social History   Socioeconomic History   Marital status: Married    Spouse name: Not on file   Number of children: 0   Years of education: Not on file   Highest education level: Not on file  Occupational History   Not on file  Tobacco Use   Smoking status: Never   Smokeless tobacco: Never  Vaping Use   Vaping Use: Never used  Substance and Sexual Activity   Alcohol use: Not Currently   Drug use: Never   Sexual activity: Not on file  Other Topics Concern   Not on file  Social History Narrative   Not on file   Social Determinants of Health   Financial Resource Strain: Not on file  Food Insecurity: Not on file  Transportation Needs: Not on file  Physical Activity: Not on file  Stress: Not on file  Social Connections: Not on file  Intimate Partner Violence: Not on file    Family History  Problem Relation Age of Onset   Heart failure Mother    Heart disease Mother    Cancer Father  Current Outpatient Medications on File Prior to Visit  Medication Sig Dispense Refill   acetaminophen (TYLENOL) 500 MG tablet Take 1,000 mg by mouth every 8 (eight) hours as needed for moderate pain.     Alpha-Lipoic Acid 300 MG TABS Take 600 mg by mouth daily.     benzonatate (TESSALON) 200 MG capsule Take 200 mg by mouth every 8 (eight) hours as needed for cough.     bisacodyl (DULCOLAX) 10 MG suppository Place 1 suppository (10 mg total) rectally daily as needed for up to 10 doses for moderate constipation. 10 suppository 0   Cholecalciferol 25 MCG (1000 UT) capsule Take 4,000 Units by mouth daily.     clopidogrel (PLAVIX) 75 MG tablet Take 75 mg by mouth  daily.     colchicine-probenecid 0.5-500 MG tablet Take 1 tablet by mouth daily.     cyanocobalamin (,VITAMIN B-12,) 1000 MCG/ML injection Inject 1,000 mcg into the skin every 30 (thirty) days.     ELIQUIS 5 MG TABS tablet Take 1 tablet (5 mg total) by mouth 2 (two) times daily. Resume on 05/11/21 (Patient taking differently: Take 5 mg by mouth 2 (two) times daily.) 60 tablet    ENTRESTO 24-26 MG TAKE ONE TABLET BY MOUTH TWICE DAILY 60 tablet 0   Evolocumab (REPATHA SURECLICK) 831 MG/ML SOAJ Inject 1 mL into the skin every 14 (fourteen) days. 2 mL 3   fenofibrate 160 MG tablet Take 160 mg by mouth daily.     fluticasone (FLONASE) 50 MCG/ACT nasal spray Place 1 spray into both nostrils as needed.     latanoprost (XALATAN) 0.005 % ophthalmic solution Place 1 drop into the left eye at bedtime.     loratadine (CLARITIN) 10 MG tablet Take 1 tablet by mouth daily.     magnesium oxide (MAG-OX) 400 MG tablet Take 400 mg by mouth 2 (two) times daily.     meclizine (ANTIVERT) 25 MG tablet Take 25 mg by mouth 3 (three) times daily as needed for nausea.     Melatonin 5 MG CHEW Chew 1 tablet by mouth daily.     metoCLOPramide (REGLAN) 5 MG tablet Take 2.5 mg by mouth at bedtime.     metoprolol succinate (TOPROL-XL) 25 MG 24 hr tablet Take 12.5 mg by mouth daily.     montelukast (SINGULAIR) 10 MG tablet Take 10 mg by mouth at bedtime.     nitroGLYCERIN (NITROSTAT) 0.4 MG SL tablet Place 0.4 mg under the tongue every 5 (five) minutes as needed for chest pain.     NOVOLOG MIX 70/30 FLEXPEN (70-30) 100 UNIT/ML FlexPen Inject 30 Units into the skin 2 (two) times daily with a meal. (Patient taking differently: Inject 40-85 Units into the skin 2 (two) times daily with a meal. Sliding scale) 15 mL 11   ondansetron (ZOFRAN-ODT) 4 MG disintegrating tablet Take 1 tablet (4 mg total) by mouth every 8 (eight) hours as needed for nausea or vomiting. 20 tablet 0   pantoprazole (PROTONIX) 40 MG tablet Take 1 tablet (40 mg  total) by mouth 2 (two) times daily. 60 tablet 0   polyethylene glycol (MIRALAX / GLYCOLAX) 17 g packet Take 17 g by mouth 2 (two) times daily. (Patient taking differently: Take 17 g by mouth as needed.) 14 each 0   Polyethylene Glycol 400 (BLINK TEARS) 0.25 % SOLN Place 1 drop into both eyes daily as needed (dry eyes).     PROAIR RESPICLICK 517 (90 Base) MCG/ACT AEPB Inhale 2 puffs into  the lungs every 6 (six) hours as needed (sob/wheezing).     Probiotic Product (ALIGN) 4 MG CAPS Take 4 mg by mouth daily.     senna-docusate (SENOKOT-S) 8.6-50 MG tablet Take 1 tablet by mouth daily.     sodium chloride (MURO 128) 5 % ophthalmic ointment Place 1 application. into both eyes as needed.     sucralfate (CARAFATE) 1 GM/10ML suspension Take 10 mLs (1 g total) by mouth 2 (two) times daily. 420 mL 0   torsemide (DEMADEX) 5 MG tablet Take 5 mg by mouth daily.     VASCEPA 1 g capsule Take 1 capsule (1 g total) by mouth 2 (two) times daily. 180 capsule 0   No current facility-administered medications on file prior to visit.    CARDIAC DATABASE:  Echocardiogram: 08/10/2021: LVEF 55 to 60%, trivial MR, estimated RAP 8 mmHg. 11/21/2021: LVEF 20-25%, severely reduced, global hypokinesis, RV systolic function mildly reduced, chronic pericardial fat pad.  11/25/2021: LVEF 40-45%.   Heart Catheterization: Coronary intervention 11/21/2021: LM: Normal LAD: Prox 100% occlusion         Diag 1 70% stenosis, followed by 100% occlusion Lcx: 99% stenosis in prox OM1 with minimal flow distally, which has progressed from previous angiogram in 07/2021        OM2 occluded RCA: Prox 50% stenosis (Present but under appreciated on previous angiogram in 07/2021)         Mid 75% stenosis (Present but under appreciated on previous angiogram in 07/2021)         This lesion either was more severe and occlusive with wires and balloons or developed spasm or dissection to render it flow limiting during the procedure, thus  requiring stenting         Distal RCA into RPDA with 95% stenosis         Ostial-prox RPLA 90% stenosis   Successful IVUS guided percutaneous coronary intervention      PTCA RPLA ostium with 2.0 X 8 mm balloon     PTA and stent placement 2.5 X 18 mm Onyx Frontier DES distal RCA/RPDA             Post dilatation with 2.5 X 8 mm [place] balloon at 18 atm     PTCA and stent placement 3.0 X 18 mm Onyx Frontier DES mid RCA             Post dilatation with 3.5 X 12 mm [place] balloon at 22 atm   Intraprocedural hypotension and shock requiring IV norepinephrine up to 30 mics, now completely weaned off Intraprocedural STE still persist, likely reflect intraprocedural no flow to RCA and to LAD via collaterals, likely causing myocardial stunning Chest pain down from 7/10 to 1-2/10   He will need close monitoring of his chest pain, EKG, hemodynamics, urine output   Complex procedure due to intra procedural chest pain and ST elevation, diagnostic challenges responsible for slow flow, intra procedural hypotension requiring norepinephrine  External Labs: Collected: 12/02/2021 [facility] available in Care Everywhere. BNP 747. Sodium 136, potassium 5.1, chloride 102, bicarb 25 BUN 19, creatinine 1.3.  Albumin 3.1. AST 11, ALT 7, alkaline phosphatase 31  External Labs: Collected: 12/16/2021 Sodium 136, potassium 4.6, chloride 102, bicarb 23, BUN 25, creatinine 1.41 mg/dL. eGFR 53 mL/min per 1.73 m NT proBNP 4924 (no prior baseline).  Magnesium level 1.9  External Labs: Collected: 12/23/2021: Sodium 134, potassium 4.9, chloride 100, bicarb 23, BUN 33, creatinine 1.5,  EGFR 49 B natruretic peptide 251  External Labs: Collected: 01/20/2022 available in Care Everywhere. A1c 7.2 Sodium 136, potassium 5.1, chloride 102, bicarb 25, BUN 35, creatinine 1.5, EGFR 49 Magnesium 2. NT proBNP 2246  External Labs: Collected: 02/04/2022 available in Care Everywhere. NT proBNP  1591. Sodium 139, potassium 4.5, chloride 104, bicarb 26, BUN 32, creatinine 1.54, EGFR 48  Review of Systems  Cardiovascular:  Negative for chest pain, dyspnea on exertion, leg swelling, orthopnea, palpitations, paroxysmal nocturnal dyspnea and syncope.  Respiratory:  Negative for cough, shortness of breath and wheezing.   Gastrointestinal:  Negative for nausea.     Vitals:   02/06/22 1450  BP: (!) 106/54  Pulse: 73  Resp: 16  SpO2: 95%   (Measured by the patient using a home BP monitor)  Physical Exam: Not performed, as this is a telephone visit  Daquane Aguilar is a 71 y.o. male whose past medical history and cardiac risk factors include: who is chronically bedbound, type 2 diabetes, gastroparesis, hyperlipidemia, statin intolerance, CKD stage IIIa, history of DVT on Eliquis, severe peripheral neuropathy, nonhealing GI ulcers status post duodenal polyp clipping, CAD status post PCI, ischemic cardiomyopathy, chronic systolic and diastolic heart failure, sleep apnea on CPAP.     ICD-10-CM   1. Chronic combined systolic and diastolic CHF (congestive heart failure) (HCC)  I50.42     2. Ischemic cardiomyopathy  I25.5     3. Hx of non-ST elevation myocardial infarction (NSTEMI)  I25.2     4. Coronary artery disease involving native coronary artery of native heart without angina pectoris  I25.10     5. Essential hypertension  I10     6. Hypercholesteremia  E78.00     7. Type 2 diabetes mellitus with hyperlipidemia (HCC)  E11.69    E78.5     8. History of pulmonary embolism  Z86.711       Recommendations: Patient has a complex past medical history including combined systolic and diastolic heart failure, ischemic cardiomyopathy, history of non-STEMI status post coronary intervention and other cardiovascular risk factors as noted above.  Since he is predominantly bedbound and transportation to and from the office is quite expensive for the patient that shared decision has  been to evaluate him telephonically and his visiting primary helps with performing a cardiovascular examination and pertinent labs.  Labs from Care Everywhere independently reviewed dated Feb 04, 2022.  After changing the torsemide from Monday Wednesday and Friday to 5 mg p.o. daily patient's NT proBNP improved and renal function remains stable.  He denies any anginal discomfort or heart failure symptoms.  Based on Dr. Shanda Bumps physical examination no significant cardiovascular findings per patient's wife.  Since last office visit he was evaluated in the emergency room department for gastritis and is recovering well.  Continue current medical therapy.  Patient has remained stable from a cardiovascular standpoint.  Recommend 1 month follow-up with labs prior to the visit.  Would like to recheck his NT proBNP, CMP, fasting lipids, and direct LDL.  Total phone encounter 20 minutes.  No orders of the defined types were placed in this encounter.  --Continue cardiac medications as reconciled in final medication list. --Return in about 4 weeks (around 03/06/2022) for Follow up, CAD, heart failure management.. Or sooner if needed. --Continue follow-up with your primary care physician regarding the management of your other chronic comorbid conditions.  Patient's questions and concerns were addressed to his satisfaction. He voices understanding of the instructions provided during this encounter.  This note was created using a voice recognition software as a result there may be grammatical errors inadvertently enclosed that do not reflect the nature of this encounter. Every attempt is made to correct such errors.  Rex Kras, [place], [facility]  Pager: [phone] Office: [phone]

## 2022-02-10 ENCOUNTER — Other Ambulatory Visit: Payer: Self-pay | Admitting: Cardiology

## 2022-02-10 DIAGNOSIS — I255 Ischemic cardiomyopathy: Secondary | ICD-10-CM

## 2022-02-10 DIAGNOSIS — I5022 Chronic systolic (congestive) heart failure: Secondary | ICD-10-CM

## 2022-02-12 ENCOUNTER — Other Ambulatory Visit: Payer: Self-pay | Admitting: Cardiology

## 2022-02-12 DIAGNOSIS — I5022 Chronic systolic (congestive) heart failure: Secondary | ICD-10-CM

## 2022-02-18 ENCOUNTER — Ambulatory Visit: Payer: Medicare Other | Admitting: Student

## 2022-02-26 ENCOUNTER — Other Ambulatory Visit: Payer: Self-pay | Admitting: Cardiology

## 2022-03-06 ENCOUNTER — Ambulatory Visit: Payer: Medicare Other | Admitting: Cardiology

## 2022-03-06 ENCOUNTER — Encounter: Payer: Self-pay | Admitting: Cardiology

## 2022-03-06 VITALS — BP 104/53 | HR 71 | Resp 16 | Ht 73.0 in

## 2022-03-06 DIAGNOSIS — I5042 Chronic combined systolic (congestive) and diastolic (congestive) heart failure: Secondary | ICD-10-CM

## 2022-03-06 DIAGNOSIS — E7800 Pure hypercholesterolemia, unspecified: Secondary | ICD-10-CM

## 2022-03-06 DIAGNOSIS — I255 Ischemic cardiomyopathy: Secondary | ICD-10-CM

## 2022-03-06 DIAGNOSIS — I10 Essential (primary) hypertension: Secondary | ICD-10-CM

## 2022-03-06 DIAGNOSIS — I2510 Atherosclerotic heart disease of native coronary artery without angina pectoris: Secondary | ICD-10-CM

## 2022-03-06 DIAGNOSIS — Z86711 Personal history of pulmonary embolism: Secondary | ICD-10-CM

## 2022-03-06 DIAGNOSIS — E1169 Type 2 diabetes mellitus with other specified complication: Secondary | ICD-10-CM

## 2022-03-06 DIAGNOSIS — I252 Old myocardial infarction: Secondary | ICD-10-CM

## 2022-03-10 ENCOUNTER — Telehealth: Payer: Self-pay

## 2022-03-12 ENCOUNTER — Other Ambulatory Visit: Payer: Self-pay | Admitting: Cardiology

## 2022-03-12 DIAGNOSIS — I5022 Chronic systolic (congestive) heart failure: Secondary | ICD-10-CM

## 2022-03-18 ENCOUNTER — Other Ambulatory Visit: Payer: Self-pay

## 2022-03-18 ENCOUNTER — Telehealth: Payer: Self-pay

## 2022-03-18 MED ORDER — MAGNESIUM OXIDE 400 MG PO TABS: 400.0000 mg | ORAL_TABLET | Freq: Three times a day (TID) | ORAL | 3 refills | Status: AC

## 2022-03-18 NOTE — Telephone Encounter (Signed)
For review: No response needed  Randy Clinton, NP called stating that patient's wife said you had changed dosage to patients Magnesium that is different from your last office notes. NP Vickki Muff would like to know what changes were made.   After speaking with Dr. Odis Hollingshead, patient is to take 400mg  TID, I will send it in to pharmacy.

## 2022-03-26 ENCOUNTER — Other Ambulatory Visit: Payer: Self-pay | Admitting: Cardiology

## 2022-04-07 ENCOUNTER — Other Ambulatory Visit: Payer: Self-pay | Admitting: Cardiology

## 2022-04-07 NOTE — Telephone Encounter (Signed)
Error

## 2022-04-10 ENCOUNTER — Other Ambulatory Visit: Payer: Self-pay | Admitting: Cardiology

## 2022-04-10 DIAGNOSIS — I5022 Chronic systolic (congestive) heart failure: Secondary | ICD-10-CM

## 2022-04-10 DIAGNOSIS — I255 Ischemic cardiomyopathy: Secondary | ICD-10-CM

## 2022-04-15 ENCOUNTER — Inpatient Hospital Stay (HOSPITAL_COMMUNITY)
Admission: EM | Admit: 2022-04-15 | Discharge: 2022-04-19 | DRG: 280 | Disposition: A | Discharge status: Home Health | Payer: Medicare Other | Attending: Internal Medicine | Admitting: Internal Medicine

## 2022-04-15 ENCOUNTER — Other Ambulatory Visit: Payer: Self-pay

## 2022-04-15 ENCOUNTER — Encounter (HOSPITAL_COMMUNITY): Payer: Self-pay

## 2022-04-15 ENCOUNTER — Emergency Department (HOSPITAL_COMMUNITY): Payer: Medicare Other

## 2022-04-15 ENCOUNTER — Telehealth: Payer: Self-pay | Admitting: Cardiology

## 2022-04-15 DIAGNOSIS — Z886 Allergy status to analgesic agent status: Secondary | ICD-10-CM

## 2022-04-15 DIAGNOSIS — J45909 Unspecified asthma, uncomplicated: Secondary | ICD-10-CM | POA: Diagnosis present

## 2022-04-15 DIAGNOSIS — E1169 Type 2 diabetes mellitus with other specified complication: Secondary | ICD-10-CM | POA: Diagnosis present

## 2022-04-15 DIAGNOSIS — I255 Ischemic cardiomyopathy: Secondary | ICD-10-CM | POA: Diagnosis present

## 2022-04-15 DIAGNOSIS — I9589 Other hypotension: Secondary | ICD-10-CM | POA: Diagnosis present

## 2022-04-15 DIAGNOSIS — I10 Essential (primary) hypertension: Secondary | ICD-10-CM | POA: Diagnosis present

## 2022-04-15 DIAGNOSIS — Z86711 Personal history of pulmonary embolism: Secondary | ICD-10-CM | POA: Diagnosis present

## 2022-04-15 DIAGNOSIS — R079 Chest pain, unspecified: Secondary | ICD-10-CM | POA: Diagnosis not present

## 2022-04-15 DIAGNOSIS — Z794 Long term (current) use of insulin: Secondary | ICD-10-CM

## 2022-04-15 DIAGNOSIS — S21209A Unspecified open wound of unspecified back wall of thorax without penetration into thoracic cavity, initial encounter: Secondary | ICD-10-CM | POA: Diagnosis present

## 2022-04-15 DIAGNOSIS — I252 Old myocardial infarction: Secondary | ICD-10-CM

## 2022-04-15 DIAGNOSIS — I130 Hypertensive heart and chronic kidney disease with heart failure and stage 1 through stage 4 chronic kidney disease, or unspecified chronic kidney disease: Secondary | ICD-10-CM | POA: Diagnosis present

## 2022-04-15 DIAGNOSIS — Z86010 Personal history of colonic polyps: Secondary | ICD-10-CM

## 2022-04-15 DIAGNOSIS — Z888 Allergy status to other drugs, medicaments and biological substances status: Secondary | ICD-10-CM

## 2022-04-15 DIAGNOSIS — Z993 Dependence on wheelchair: Secondary | ICD-10-CM

## 2022-04-15 DIAGNOSIS — E785 Hyperlipidemia, unspecified: Secondary | ICD-10-CM | POA: Diagnosis present

## 2022-04-15 DIAGNOSIS — I214 Non-ST elevation (NSTEMI) myocardial infarction: Secondary | ICD-10-CM | POA: Diagnosis not present

## 2022-04-15 DIAGNOSIS — E875 Hyperkalemia: Secondary | ICD-10-CM | POA: Diagnosis present

## 2022-04-15 DIAGNOSIS — I2510 Atherosclerotic heart disease of native coronary artery without angina pectoris: Secondary | ICD-10-CM | POA: Diagnosis present

## 2022-04-15 DIAGNOSIS — Z7401 Bed confinement status: Secondary | ICD-10-CM

## 2022-04-15 DIAGNOSIS — S20409A Unspecified superficial injuries of unspecified back wall of thorax, initial encounter: Secondary | ICD-10-CM | POA: Diagnosis present

## 2022-04-15 DIAGNOSIS — I5033 Acute on chronic diastolic (congestive) heart failure: Secondary | ICD-10-CM | POA: Diagnosis present

## 2022-04-15 DIAGNOSIS — Z7902 Long term (current) use of antithrombotics/antiplatelets: Secondary | ICD-10-CM

## 2022-04-15 DIAGNOSIS — R269 Unspecified abnormalities of gait and mobility: Secondary | ICD-10-CM | POA: Diagnosis present

## 2022-04-15 DIAGNOSIS — Z86718 Personal history of other venous thrombosis and embolism: Secondary | ICD-10-CM

## 2022-04-15 DIAGNOSIS — E1143 Type 2 diabetes mellitus with diabetic autonomic (poly)neuropathy: Secondary | ICD-10-CM | POA: Diagnosis present

## 2022-04-15 DIAGNOSIS — H8110 Benign paroxysmal vertigo, unspecified ear: Secondary | ICD-10-CM | POA: Diagnosis present

## 2022-04-15 DIAGNOSIS — K3184 Gastroparesis: Secondary | ICD-10-CM | POA: Diagnosis present

## 2022-04-15 DIAGNOSIS — I5023 Acute on chronic systolic (congestive) heart failure: Secondary | ICD-10-CM | POA: Diagnosis present

## 2022-04-15 DIAGNOSIS — E7800 Pure hypercholesterolemia, unspecified: Secondary | ICD-10-CM | POA: Diagnosis present

## 2022-04-15 DIAGNOSIS — Z955 Presence of coronary angioplasty implant and graft: Secondary | ICD-10-CM

## 2022-04-15 DIAGNOSIS — D72829 Elevated white blood cell count, unspecified: Secondary | ICD-10-CM | POA: Diagnosis present

## 2022-04-15 DIAGNOSIS — N1831 Chronic kidney disease, stage 3a: Secondary | ICD-10-CM | POA: Diagnosis present

## 2022-04-15 DIAGNOSIS — E1122 Type 2 diabetes mellitus with diabetic chronic kidney disease: Secondary | ICD-10-CM | POA: Diagnosis present

## 2022-04-15 DIAGNOSIS — R262 Difficulty in walking, not elsewhere classified: Secondary | ICD-10-CM | POA: Diagnosis present

## 2022-04-15 DIAGNOSIS — Z20822 Contact with and (suspected) exposure to covid-19: Secondary | ICD-10-CM | POA: Diagnosis present

## 2022-04-15 DIAGNOSIS — G4733 Obstructive sleep apnea (adult) (pediatric): Secondary | ICD-10-CM | POA: Diagnosis present

## 2022-04-15 DIAGNOSIS — Z79899 Other long term (current) drug therapy: Secondary | ICD-10-CM

## 2022-04-15 DIAGNOSIS — Z7901 Long term (current) use of anticoagulants: Secondary | ICD-10-CM

## 2022-04-15 DIAGNOSIS — Z8249 Family history of ischemic heart disease and other diseases of the circulatory system: Secondary | ICD-10-CM

## 2022-04-15 DIAGNOSIS — N179 Acute kidney failure, unspecified: Secondary | ICD-10-CM | POA: Diagnosis present

## 2022-04-15 DIAGNOSIS — Z8711 Personal history of peptic ulcer disease: Secondary | ICD-10-CM

## 2022-04-15 DIAGNOSIS — T8189XA Other complications of procedures, not elsewhere classified, initial encounter: Secondary | ICD-10-CM | POA: Diagnosis present

## 2022-04-15 LAB — BASIC METABOLIC PANEL
Anion gap: 8 (ref 5–15)
BUN: 71 mg/dL — ABNORMAL HIGH (ref 8–23)
CO2: 25 mmol/L (ref 22–32)
Calcium: 8.7 mg/dL — ABNORMAL LOW (ref 8.9–10.3)
Chloride: 102 mmol/L (ref 98–111)
Creatinine, Ser: 1.78 mg/dL — ABNORMAL HIGH (ref 0.61–1.24)
GFR, Estimated: 40 mL/min — ABNORMAL LOW (ref >60)
Glucose, Bld: 251 mg/dL — ABNORMAL HIGH (ref 70–99)
Potassium: 5.7 mmol/L — ABNORMAL HIGH (ref 3.5–5.1)
Sodium: 135 mmol/L (ref 135–145)

## 2022-04-15 LAB — CBC
HCT: 36.8 % — ABNORMAL LOW (ref 39.0–52.0)
Hemoglobin: 11.9 g/dL — ABNORMAL LOW (ref 13.0–17.0)
MCH: 30.1 pg (ref 26.0–34.0)
MCHC: 32.3 g/dL (ref 30.0–36.0)
MCV: 93.2 fL (ref 80.0–100.0)
Platelets: 301 10*3/uL (ref 150–400)
RBC: 3.95 MIL/uL — ABNORMAL LOW (ref 4.22–5.81)
RDW: 15.8 % — ABNORMAL HIGH (ref 11.5–15.5)
WBC: 12.4 10*3/uL — ABNORMAL HIGH (ref 4.0–10.5)
nRBC: 0 % (ref 0.0–0.2)

## 2022-04-15 LAB — POC OCCULT BLOOD, ED: Fecal Occult Bld: NEGATIVE

## 2022-04-15 LAB — BRAIN NATRIURETIC PEPTIDE: B Natriuretic Peptide: 147.9 pg/mL — ABNORMAL HIGH (ref 0.0–100.0)

## 2022-04-15 LAB — TROPONIN I (HIGH SENSITIVITY): Troponin I (High Sensitivity): 42 ng/L — ABNORMAL HIGH (ref <18)

## 2022-04-15 LAB — D-DIMER, QUANTITATIVE: D-Dimer, Quant: <0.27 ug/mL-FEU (ref 0.00–0.50)

## 2022-04-15 MED ORDER — SODIUM CHLORIDE 0.9 % IV BOLUS
500.0000 mL | Freq: Once | INTRAVENOUS | Status: AC
  Administered 2022-04-15: 500 mL via INTRAVENOUS

## 2022-04-15 MED ORDER — ACETAMINOPHEN 500 MG PO TABS
1000.0000 mg | ORAL_TABLET | ORAL | Status: AC
  Administered 2022-04-15: 1000 mg via ORAL
  Filled 2022-04-15: qty 2

## 2022-04-15 MED ORDER — TORSEMIDE 10 MG PO TABS
5.0000 mg | ORAL_TABLET | Freq: Every day | ORAL | 1 refills | Status: AC
Start: 2022-04-15 — End: ?

## 2022-04-15 MED ORDER — SODIUM ZIRCONIUM CYCLOSILICATE 10 G PO PACK
10.0000 g | PACK | ORAL | Status: AC
  Administered 2022-04-15: 10 g via ORAL
  Filled 2022-04-15: qty 1

## 2022-04-15 NOTE — ED Provider Notes (Signed)
[facility] EMERGENCY DEPARTMENT Provider Note   CSN: [id number] Arrival date & time: 04/15/22  1842     History {Add pertinent medical, surgical, social history, OB history to HPI:1} Chief Complaint  Patient presents with   Chest Pain   Cough    Randy Liu is a 71 y.o. male.  71 year old male with a history of heart failure with an EF of 40 to 45%, CAD sp PCI, PE on Eliquis, CKD, hypertension, OSA on BiPAP who presents to the emergency department with shortness of breath.  For the past 1 to 2 weeks the patient and his wife report that he has been having symptoms of a viral infection.  His wife states that he was on prednisone as well as antibiotics and had been getting somewhat better until this afternoon.  She reports that at 2 PM he stated that he was having substernal chest discomfort.  He describes it as a pressure that was associated with taking deep breaths as well as coughing.  Says that he has had a cough during this time that has been productive.  Says that he also felt very short of breath when he felt the shortness of breath come on at 2 PM.  Says that he has been nauseous and did have some episodes of dry heaving.  No diaphoresis.  No radiation of the pain.  He is not sure if it is exertional because he has not walked but did try nitroglycerin which he says improved the pain mildly.  Says that the pain went away and then has recurred several times.  Says that 1 of these times it was radiating down his left arm so they called 911.  He was given 4 baby aspirin at that time and brought to the hospital.   Chest Pain Associated symptoms: cough   Cough Associated symptoms: chest pain        Home Medications Prior to Admission medications   Medication Sig Start Date End Date Taking? Authorizing Provider  acetaminophen (TYLENOL) 500 MG tablet Take 1,000 mg by mouth every 8 (eight) hours as needed for moderate pain.    [provider]   Alpha-Lipoic Acid 300 MG TABS Take 600 mg by mouth daily.    [provider]  benzonatate (TESSALON) 200 MG capsule Take 200 mg by mouth every 8 (eight) hours as needed for cough. 11/18/20   [provider]  bisacodyl (DULCOLAX) 10 MG suppository Place 1 suppository (10 mg total) rectally daily as needed for up to 10 doses for moderate constipation. 05/06/21   Lanae Boast, MD  Cholecalciferol 25 MCG (1000 UT) capsule Take 4,000 Units by mouth daily.    [provider]  clopidogrel (PLAVIX) 75 MG tablet TAKE ONE (1) TABLET BY MOUTH EVERY DAY 03/13/22   Tolia, Sunit, DO  colchicine-probenecid 0.5-500 MG tablet Take 1 tablet by mouth daily. 11/21/20   [provider]  cyanocobalamin (,VITAMIN B-12,) 1000 MCG/ML injection Inject 1,000 mcg into the skin every 30 (thirty) days. 09/23/20   [provider]  ELIQUIS 5 MG TABS tablet Take 1 tablet (5 mg total) by mouth 2 (two) times daily. Resume on 05/11/21 Patient taking differently: Take 5 mg by mouth 2 (two) times daily. 05/11/21   Lanae Boast, MD  ENTRESTO 24-26 MG TAKE ONE TABLET BY MOUTH TWICE DAILY 04/13/22   Tolia, Sunit, DO  Evolocumab (REPATHA SURECLICK) 140 MG/ML SOAJ Inject 1 mL into the skin every 14 (fourteen) days. 08/27/21   Jacinto Halim,  Ulice Dash, MD  fenofibrate 160 MG tablet Take 160 mg by mouth daily. 12/04/20   [provider]  fluticasone (FLONASE) 50 MCG/ACT nasal spray Place 1 spray into both nostrils as needed. 07/08/21   [provider]  latanoprost (XALATAN) 0.005 % ophthalmic solution Place 1 drop into the left eye at bedtime. 10/29/20   [provider]  loratadine (CLARITIN) 10 MG tablet Take 1 tablet by mouth daily. 01/20/22   [provider]  magnesium oxide (MAG-OX) 400 MG tablet Take 1 tablet (400 mg total) by mouth 3 (three) times daily. 03/18/22   Tolia, Sunit, DO  meclizine (ANTIVERT) 25 MG tablet Take 25 mg by mouth 3 (three) times daily as needed for nausea.     [provider]  Melatonin 5 MG CHEW Chew 1 tablet by mouth daily. 01/20/22   [provider]  metoCLOPramide (REGLAN) 5 MG tablet Take 2.5 mg by mouth at bedtime. 10/09/20   [provider]  metoprolol succinate (TOPROL-XL) 25 MG 24 hr tablet TAKE 1/2 TABLET BY MOUTH DAILY 03/26/22   Tolia, Sunit, DO  montelukast (SINGULAIR) 10 MG tablet Take 10 mg by mouth at bedtime. 09/28/20   [provider]  nitroGLYCERIN (NITROSTAT) 0.4 MG SL tablet Place 0.4 mg under the tongue every 5 (five) minutes as needed for chest pain.    [provider]  NOVOLOG MIX 70/30 FLEXPEN (70-30) 100 UNIT/ML FlexPen Inject 30 Units into the skin 2 (two) times daily with a meal. Patient taking differently: Inject 40-85 Units into the skin 2 (two) times daily with a meal. Sliding scale 05/09/21   Antonieta Pert, MD  ondansetron (ZOFRAN-ODT) 4 MG disintegrating tablet Take 1 tablet (4 mg total) by mouth every 8 (eight) hours as needed for nausea or vomiting. 09/05/21   Sharen Hones, MD  pantoprazole (PROTONIX) 40 MG tablet Take 1 tablet (40 mg total) by mouth 2 (two) times daily. 09/05/21   Sharen Hones, MD  polyethylene glycol (MIRALAX / GLYCOLAX) 17 g packet Take 17 g by mouth 2 (two) times daily. Patient taking differently: Take 17 g by mouth as needed. 11/27/21   Pokhrel, Corrie Mckusick, MD  Polyethylene Glycol 400 (BLINK TEARS) 0.25 % SOLN Place 1 drop into both eyes daily as needed (dry eyes).    [provider]  PROAIR RESPICLICK 123XX123 (90 Base) MCG/ACT AEPB Inhale 2 puffs into the lungs every 6 (six) hours as needed (sob/wheezing). 04/03/21   [provider]  Probiotic Product (ALIGN) 4 MG CAPS Take 4 mg by mouth daily.    [provider]  senna-docusate (SENOKOT-S) 8.6-50 MG tablet Take 1 tablet by mouth daily.    [provider]  sodium chloride (MURO 128) 5 % ophthalmic ointment Place 1 application. into both eyes as needed.    [provider]   sucralfate (CARAFATE) 1 GM/10ML suspension Take 10 mLs (1 g total) by mouth 2 (two) times daily. 09/05/21   Sharen Hones, MD  torsemide (DEMADEX) 10 MG tablet Take 0.5 tablets (5 mg total) by mouth daily. 04/15/22   Tolia, Sunit, DO  VASCEPA 1 g capsule TAKE ONE TABLET BY MOUTH TWICE DAILY 04/07/22   Tolia, Sunit, DO      Allergies    Metoclopramide, Nsaids, Amoxicillin-pot clavulanate, Statins, Allopurinol, Empagliflozin, and Tiotropium    Review of Systems   Review of Systems  Respiratory:  Positive for cough.   Cardiovascular:  Positive for chest pain.    Physical Exam Updated Vital Signs BP Marland Kitchen)  105/58   Pulse 88   Temp 98.1 F (36.7 C) (Oral)   Resp 20   Ht 6\' 1"  (1.854 m)   Wt 123.8 kg   SpO2 96%   BMI 36.02 kg/m  Physical Exam  ED Results / Procedures / Treatments   Labs (all labs ordered are listed, but only abnormal results are displayed) Labs Reviewed  CBC - Abnormal; Notable for the following components:      Result Value   WBC 12.4 (*)    RBC 3.95 (*)    Hemoglobin 11.9 (*)    HCT 36.8 (*)    RDW 15.8 (*)    All other components within normal limits  BASIC METABOLIC PANEL  D-DIMER, QUANTITATIVE  TROPONIN I (HIGH SENSITIVITY)  TROPONIN I (HIGH SENSITIVITY)    EKG None  Radiology DG Chest 2 View  Result Date: 04/15/2022 CLINICAL DATA:  Chest pain EXAM: CHEST - 2 VIEW COMPARISON:  Radiographs 11/24/2021 FINDINGS: Left basilar atelectasis similar to prior. No pleural effusion, or pneumothorax. Normal cardiomediastinal silhouette. No acute osseous abnormality. Aortic atherosclerotic calcification. IMPRESSION: Scar versus atelectasis in the left base, similar to prior. Electronically Signed   By: 11/26/2021 M.D.   On: 04/15/2022 20:13    Procedures Procedures  {Document cardiac monitor, telemetry assessment procedure when appropriate:1}  Medications Ordered in ED Medications - No data to display  ED Course/ Medical Decision Making/ A&P Clinical  Course as of 04/15/22 2039  Wed Apr 15, 2022  2032 DG Chest 2 View IMPRESSION: Scar versus atelectasis in the left base, similar to prior.   [RP]    Clinical Course User Index [RP] Apr 17, 2022, MD                           Medical Decision Making Amount and/or Complexity of Data Reviewed Labs: ordered. Radiology: ordered. Decision-making details documented in ED Course.  Risk OTC drugs. Prescription drug management. Decision regarding hospitalization.   ***  {Document critical care time when appropriate:1} {Document review of labs and clinical decision tools ie heart score, Chads2Vasc2 etc:1}  {Document your independent review of radiology images, and any outside records:1} {Document your discussion with family members, caretakers, and with consultants:1} {Document social determinants of health affecting pt's care:1} {Document your decision making why or why not admission, treatments were needed:1} Final Clinical Impression(s) / ED Diagnoses Final diagnoses:  None    Rx / DC Orders ED Discharge Orders     None

## 2022-04-15 NOTE — ED Notes (Signed)
Pt states he has the urge to void, but is unable to do so. Bladder scanner estimates 316 mL.

## 2022-04-15 NOTE — ED Triage Notes (Addendum)
Pt BIB [facility] from home. Pt presents with 10 day hx of cough followed by CP that started today. Pain is worse when coughing. Pt took 2 NTG without relief.

## 2022-04-16 ENCOUNTER — Observation Stay (HOSPITAL_COMMUNITY): Payer: Medicare Other

## 2022-04-16 DIAGNOSIS — S21202A Unspecified open wound of left back wall of thorax without penetration into thoracic cavity, initial encounter: Secondary | ICD-10-CM

## 2022-04-16 DIAGNOSIS — Z86711 Personal history of pulmonary embolism: Secondary | ICD-10-CM | POA: Diagnosis not present

## 2022-04-16 DIAGNOSIS — I214 Non-ST elevation (NSTEMI) myocardial infarction: Secondary | ICD-10-CM

## 2022-04-16 DIAGNOSIS — I2510 Atherosclerotic heart disease of native coronary artery without angina pectoris: Secondary | ICD-10-CM | POA: Diagnosis present

## 2022-04-16 DIAGNOSIS — S20409A Unspecified superficial injuries of unspecified back wall of thorax, initial encounter: Secondary | ICD-10-CM | POA: Diagnosis present

## 2022-04-16 DIAGNOSIS — E1122 Type 2 diabetes mellitus with diabetic chronic kidney disease: Secondary | ICD-10-CM | POA: Diagnosis present

## 2022-04-16 DIAGNOSIS — E1169 Type 2 diabetes mellitus with other specified complication: Secondary | ICD-10-CM | POA: Diagnosis present

## 2022-04-16 DIAGNOSIS — R262 Difficulty in walking, not elsewhere classified: Secondary | ICD-10-CM | POA: Diagnosis not present

## 2022-04-16 DIAGNOSIS — Z993 Dependence on wheelchair: Secondary | ICD-10-CM | POA: Diagnosis not present

## 2022-04-16 DIAGNOSIS — Z86718 Personal history of other venous thrombosis and embolism: Secondary | ICD-10-CM | POA: Diagnosis not present

## 2022-04-16 DIAGNOSIS — J45909 Unspecified asthma, uncomplicated: Secondary | ICD-10-CM | POA: Diagnosis present

## 2022-04-16 DIAGNOSIS — N179 Acute kidney failure, unspecified: Secondary | ICD-10-CM | POA: Diagnosis present

## 2022-04-16 DIAGNOSIS — I5023 Acute on chronic systolic (congestive) heart failure: Secondary | ICD-10-CM | POA: Diagnosis not present

## 2022-04-16 DIAGNOSIS — D72829 Elevated white blood cell count, unspecified: Secondary | ICD-10-CM | POA: Diagnosis present

## 2022-04-16 DIAGNOSIS — Z20822 Contact with and (suspected) exposure to covid-19: Secondary | ICD-10-CM | POA: Diagnosis present

## 2022-04-16 DIAGNOSIS — E7800 Pure hypercholesterolemia, unspecified: Secondary | ICD-10-CM | POA: Diagnosis present

## 2022-04-16 DIAGNOSIS — K3184 Gastroparesis: Secondary | ICD-10-CM | POA: Diagnosis present

## 2022-04-16 DIAGNOSIS — E875 Hyperkalemia: Secondary | ICD-10-CM | POA: Diagnosis present

## 2022-04-16 DIAGNOSIS — I10 Essential (primary) hypertension: Secondary | ICD-10-CM

## 2022-04-16 DIAGNOSIS — N1831 Chronic kidney disease, stage 3a: Secondary | ICD-10-CM

## 2022-04-16 DIAGNOSIS — E785 Hyperlipidemia, unspecified: Secondary | ICD-10-CM

## 2022-04-16 DIAGNOSIS — I130 Hypertensive heart and chronic kidney disease with heart failure and stage 1 through stage 4 chronic kidney disease, or unspecified chronic kidney disease: Secondary | ICD-10-CM | POA: Diagnosis present

## 2022-04-16 DIAGNOSIS — I252 Old myocardial infarction: Secondary | ICD-10-CM | POA: Diagnosis not present

## 2022-04-16 DIAGNOSIS — R079 Chest pain, unspecified: Secondary | ICD-10-CM | POA: Diagnosis present

## 2022-04-16 DIAGNOSIS — E1143 Type 2 diabetes mellitus with diabetic autonomic (poly)neuropathy: Secondary | ICD-10-CM | POA: Diagnosis present

## 2022-04-16 DIAGNOSIS — Z955 Presence of coronary angioplasty implant and graft: Secondary | ICD-10-CM | POA: Diagnosis not present

## 2022-04-16 DIAGNOSIS — I5033 Acute on chronic diastolic (congestive) heart failure: Secondary | ICD-10-CM | POA: Diagnosis present

## 2022-04-16 DIAGNOSIS — T8189XA Other complications of procedures, not elsewhere classified, initial encounter: Secondary | ICD-10-CM | POA: Diagnosis present

## 2022-04-16 DIAGNOSIS — H8110 Benign paroxysmal vertigo, unspecified ear: Secondary | ICD-10-CM | POA: Diagnosis present

## 2022-04-16 DIAGNOSIS — I255 Ischemic cardiomyopathy: Secondary | ICD-10-CM | POA: Diagnosis present

## 2022-04-16 DIAGNOSIS — G4733 Obstructive sleep apnea (adult) (pediatric): Secondary | ICD-10-CM | POA: Diagnosis present

## 2022-04-16 LAB — CBG MONITORING, ED
Glucose-Capillary: 201 mg/dL — ABNORMAL HIGH (ref 70–99)
Glucose-Capillary: 143 mg/dL — ABNORMAL HIGH (ref 70–99)
Glucose-Capillary: 148 mg/dL — ABNORMAL HIGH (ref 70–99)
Glucose-Capillary: 154 mg/dL — ABNORMAL HIGH (ref 70–99)

## 2022-04-16 LAB — APTT
aPTT: 60 seconds — ABNORMAL HIGH (ref 24–36)
aPTT: 69 seconds — ABNORMAL HIGH (ref 24–36)

## 2022-04-16 LAB — RESPIRATORY PANEL BY PCR

## 2022-04-16 LAB — SARS CORONAVIRUS 2 BY RT PCR: SARS Coronavirus 2 by RT PCR: NEGATIVE

## 2022-04-16 LAB — BASIC METABOLIC PANEL
Anion gap: 7 (ref 5–15)
BUN: 64 mg/dL — ABNORMAL HIGH (ref 8–23)
CO2: 26 mmol/L (ref 22–32)
Calcium: 8.7 mg/dL — ABNORMAL LOW (ref 8.9–10.3)
Chloride: 103 mmol/L (ref 98–111)
Creatinine, Ser: 1.73 mg/dL — ABNORMAL HIGH (ref 0.61–1.24)
GFR, Estimated: 41 mL/min — ABNORMAL LOW (ref >60)
Glucose, Bld: 196 mg/dL — ABNORMAL HIGH (ref 70–99)
Potassium: 5.2 mmol/L — ABNORMAL HIGH (ref 3.5–5.1)
Sodium: 136 mmol/L (ref 135–145)

## 2022-04-16 LAB — TROPONIN I (HIGH SENSITIVITY)
Troponin I (High Sensitivity): 89 ng/L — ABNORMAL HIGH (ref <18)
Troponin I (High Sensitivity): 163 ng/L (ref <18)
Troponin I (High Sensitivity): 160 ng/L (ref <18)
Troponin I (High Sensitivity): 151 ng/L (ref <18)
Troponin I (High Sensitivity): 76 ng/L — ABNORMAL HIGH (ref <18)

## 2022-04-16 LAB — GLUCOSE, CAPILLARY
Glucose-Capillary: 130 mg/dL — ABNORMAL HIGH (ref 70–99)
Glucose-Capillary: 146 mg/dL — ABNORMAL HIGH (ref 70–99)

## 2022-04-16 LAB — HEMOGLOBIN A1C
Hgb A1c MFr Bld: 7.4 % — ABNORMAL HIGH (ref 4.8–5.6)
Mean Plasma Glucose: 165.68 mg/dL

## 2022-04-16 LAB — LIPASE, BLOOD: Lipase: 25 U/L (ref 11–51)

## 2022-04-16 LAB — TSH: TSH: 2.959 u[IU]/mL (ref 0.350–4.500)

## 2022-04-16 LAB — PROCALCITONIN: Procalcitonin: <0.1 ng/mL

## 2022-04-16 LAB — HEPARIN LEVEL (UNFRACTIONATED): Heparin Unfractionated: >1.1 IU/mL — ABNORMAL HIGH (ref 0.30–0.70)

## 2022-04-16 MED ORDER — PANTOPRAZOLE SODIUM 40 MG PO TBEC
40.0000 mg | DELAYED_RELEASE_TABLET | Freq: Every day | ORAL | Status: DC
  Administered 2022-04-16 – 2022-04-19 (×4): 40 mg via ORAL
  Filled 2022-04-16 (×4): qty 1

## 2022-04-16 MED ORDER — SODIUM ZIRCONIUM CYCLOSILICATE 10 G PO PACK
10.0000 g | PACK | Freq: Once | ORAL | Status: AC
  Administered 2022-04-16: 10 g via ORAL
  Filled 2022-04-16: qty 1

## 2022-04-16 MED ORDER — MECLIZINE HCL 25 MG PO TABS
25.0000 mg | ORAL_TABLET | Freq: Three times a day (TID) | ORAL | Status: DC
Start: 2022-04-16 — End: 2022-04-19
  Administered 2022-04-16 – 2022-04-19 (×8): 25 mg via ORAL
  Filled 2022-04-16 (×8): qty 1

## 2022-04-16 MED ORDER — INSULIN ASPART 100 UNIT/ML IJ SOLN
0.0000 [IU] | INTRAMUSCULAR | Status: DC
  Administered 2022-04-16: 1 [IU] via SUBCUTANEOUS
  Administered 2022-04-16 (×2): 3 [IU] via SUBCUTANEOUS
  Administered 2022-04-16 (×2): 1 [IU] via SUBCUTANEOUS
  Administered 2022-04-17: 2 [IU] via SUBCUTANEOUS
  Administered 2022-04-17: 1 [IU] via SUBCUTANEOUS
  Administered 2022-04-17 (×2): 2 [IU] via SUBCUTANEOUS
  Administered 2022-04-18: 3 [IU] via SUBCUTANEOUS
  Administered 2022-04-18: 1 [IU] via SUBCUTANEOUS
  Administered 2022-04-18 (×2): 2 [IU] via SUBCUTANEOUS
  Administered 2022-04-18: 3 [IU] via SUBCUTANEOUS

## 2022-04-16 MED ORDER — PERFLUTREN LIPID MICROSPHERE
1.0000 mL | INTRAVENOUS | Status: AC | PRN
  Administered 2022-04-16: 4 mL via INTRAVENOUS

## 2022-04-16 MED ORDER — BUMETANIDE 0.25 MG/ML IJ SOLN
0.5000 mg | Freq: Every day | INTRAMUSCULAR | Status: DC
  Administered 2022-04-16: 0.5 mg via INTRAVENOUS
  Filled 2022-04-16 (×2): qty 2

## 2022-04-16 MED ORDER — ACETAMINOPHEN 325 MG PO TABS
650.0000 mg | ORAL_TABLET | ORAL | Status: DC | PRN
  Administered 2022-04-16: 650 mg via ORAL
  Filled 2022-04-16: qty 2

## 2022-04-16 MED ORDER — FENOFIBRATE 160 MG PO TABS
160.0000 mg | ORAL_TABLET | Freq: Every day | ORAL | Status: DC
  Administered 2022-04-16 – 2022-04-19 (×4): 160 mg via ORAL
  Filled 2022-04-16 (×4): qty 1

## 2022-04-16 MED ORDER — CLOPIDOGREL BISULFATE 75 MG PO TABS
75.0000 mg | ORAL_TABLET | Freq: Every day | ORAL | Status: DC
  Administered 2022-04-16 – 2022-04-19 (×4): 75 mg via ORAL
  Filled 2022-04-16 (×4): qty 1

## 2022-04-16 MED ORDER — LATANOPROST 0.005 % OP SOLN
1.0000 [drp] | Freq: Every day | OPHTHALMIC | Status: DC
Start: 2022-04-16 — End: 2022-04-19
  Administered 2022-04-16 – 2022-04-18 (×3): 1 [drp] via OPHTHALMIC
  Filled 2022-04-16: qty 2.5

## 2022-04-16 MED ORDER — METOPROLOL TARTRATE 12.5 MG HALF TABLET
12.5000 mg | ORAL_TABLET | Freq: Two times a day (BID) | ORAL | Status: DC
  Administered 2022-04-16 – 2022-04-19 (×7): 12.5 mg via ORAL
  Filled 2022-04-16 (×7): qty 1

## 2022-04-16 MED ORDER — HEPARIN (PORCINE) 25000 UT/250ML-% IV SOLN
1400.0000 [IU]/h | INTRAVENOUS | Status: DC
  Administered 2022-04-16: 1200 [IU]/h via INTRAVENOUS
  Administered 2022-04-16: 1400 [IU]/h via INTRAVENOUS
  Filled 2022-04-16 (×2): qty 250

## 2022-04-16 MED ORDER — ONDANSETRON HCL 4 MG/2ML IJ SOLN
4.0000 mg | Freq: Four times a day (QID) | INTRAMUSCULAR | Status: DC | PRN
  Administered 2022-04-16 – 2022-04-19 (×4): 4 mg via INTRAVENOUS
  Filled 2022-04-16 (×3): qty 2

## 2022-04-16 MED ORDER — POLYETHYLENE GLYCOL 3350 17 G PO PACK: 17.0000 g | PACK | Freq: Two times a day (BID) | ORAL | Status: DC | PRN

## 2022-04-16 MED ORDER — HEPARIN BOLUS VIA INFUSION
1500.0000 [IU] | Freq: Once | INTRAVENOUS | Status: AC
  Administered 2022-04-16: 1500 [IU] via INTRAVENOUS
  Filled 2022-04-16: qty 1500

## 2022-04-16 MED ORDER — FUROSEMIDE 10 MG/ML IJ SOLN
40.0000 mg | Freq: Once | INTRAMUSCULAR | Status: AC
  Administered 2022-04-16: 40 mg via INTRAVENOUS
  Filled 2022-04-16: qty 4

## 2022-04-16 MED ORDER — NITROGLYCERIN 0.4 MG SL SUBL: 0.4000 mg | SUBLINGUAL_TABLET | SUBLINGUAL | Status: DC | PRN

## 2022-04-16 NOTE — Assessment & Plan Note (Signed)
1. Hold home 70/30 2. Putting on low dose SSI for the moment while NPO

## 2022-04-16 NOTE — ED Notes (Signed)
MD Julian Reil notified of troponin.

## 2022-04-16 NOTE — Assessment & Plan Note (Addendum)
HPI + rising trops = concern for mild NSTEMI. 1. ACS pathway 2. Hold eliquis and using heparin gtt instead for the moment 3. Continue plavix (2 stents in March) 1. Pt not having STEMI at the moment 2. CP resolved at the moment and asymptomatic at rest 3. Doubt he had a stent go down or anything 4. Trend trops 5. EDP spoke with Dr. Odis Hollingshead 6. Regarding alternative possible causes of symptoms and trop elevation: 1. Checking lipase but L arm pain he had doesn't really sound like pancreatitis, no h/o pancreatitis, does have very recent h/o CAD "in spades" so to speak 2. D.Dimer is very negative  < 0.27 and patient is on eliquis, hasnt missed a dose: PE seems very unlikely 3. Resolution of symptoms makes aortic dissection also seem unlikely.

## 2022-04-16 NOTE — Progress Notes (Signed)
ANTICOAGULATION CONSULT NOTE - Initial Consult  Pharmacy Consult for Heparin (Apixaban on hold) Indication: chest pain/ACS, history of VTE  Allergies  Allergen Reactions   Metoclopramide Itching    Loss of Balance; Urinary Incontinence   Nsaids     Other reaction(s): Other (See Comments) Renal Insufficiency Kidney issues    Amoxicillin-Pot Clavulanate Diarrhea and Nausea And Vomiting    Other reaction(s): Other (See Comments) Abdomen Pain Abdomen pain    Statins     Other reaction(s): Other (See Comments) Myalgias and Myositis myalgias    Allopurinol Rash   Empagliflozin     Other reaction(s): Other (See Comments) Fainting, weakness, lightheaded, falling   Tiotropium     Other reaction(s): Other (See Comments) Urinary retention    Patient Measurements: Height: 6\' 1"  (185.4 cm) Weight: 123.8 kg (273 lb) IBW/kg (Calculated) : 79.9  Vital Signs: Temp: 97.6 F (36.4 C) (08/03 0000) Temp Source: Oral (08/03 0000) BP: 94/64 (08/03 0130) Pulse Rate: 69 (08/03 0130)  Labs: Recent Labs    04/15/22 1954 04/15/22 2143  HGB 11.9*  --   HCT 36.8*  --   PLT 301  --   CREATININE 1.78*  --   TROPONINIHS 42* 89*    Estimated Creatinine Clearance: 51 mL/min (A) (by C-G formula based on SCr of 1.78 mg/dL (H)).   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 Last dose of apixaban was 8/2 at 1100, ok to start heparin now. Anticipate using aPTT to dose for now.   Goal of  Therapy:  Heparin level 0.3-0.7 units/ml aPTT 66-102 seconds Monitor platelets by anticoagulation protocol: Yes   Plan:  Start heparin drip at 1200 units/hr 1100 heparin level and aPTT Daily CBC, heparin level, and aPTT Monitor for bleeding F/U cardiology work-up  63, PharmD, BCPS Clinical Pharmacist Phone: [phone]

## 2022-04-16 NOTE — Progress Notes (Signed)
Orthopedic Tech Progress Note Patient Details:  Randy Liu May 20, 1951 [id number]   Bilateral unna boots applied today. Will need to be removed Monday 8/7 by nursing for new unna boots to be applied.  Ortho Devices Type of Ortho Device: Radio broadcast assistant Ortho Device/Splint Location: BLE Ortho Device/Splint Interventions: Ordered, Application   Post Interventions Patient Tolerated: Well Instructions Provided: Care of device  Keagan Anthis Carmine Savoy 04/16/2022, 4:50 PM

## 2022-04-16 NOTE — H&P (Signed)
History and Physical    Patient: Randy Liu [id number] DOB: [birth year] DOA: 04/15/2022 DOS: the patient was seen and examined on 04/16/2022 PCP: [name], MD  Patient coming from: Home  Chief Complaint:  Chief Complaint  Patient presents with   Chest Pain   Cough   HPI: Randy Liu is a 71 y.o. male with medical history significant of DM2, DVT/PE on eliquis, HTN, HFrEF, ICM, multiple NSTEMIs over past several months.  Bed / wheelchair bound at baseline.  CKD 3.  Most recently pt with NSTEMI in March.  Due to being poor candidate for CABG decision made to do high risk PCI and stent X2 in March.  Pt presents to ED today with c/o L sided CP, L arm pain, Nausea and vomiting.  Symptoms onset earlier today.  Symptoms resolved at this time.   Review of Systems: As mentioned in the history of present illness. All other systems reviewed and are negative. Past Medical History:  Diagnosis Date   Anal fissure    Asthma    Cardiomyopathy ([place])    CHF (congestive heart failure) ([place])    Coronary artery disease    Diabetes mellitus without complication ([place])    History of DVT (deep vein thrombosis)    History of ulcer disease    Hyperlipidemia    Hypertension    Peripheral neuropathy    Sleep apnea    Past Surgical History:  Procedure Laterality Date   BIOPSY  12/14/2020   Procedure: BIOPSY;  Surgeon: Jackquline Denmark, MD;  Location: [facility];  Service: Endoscopy;;   BIOPSY  09/05/2021   Procedure: BIOPSY;  Surgeon: Irving Copas., MD;  Location: [facility];  Service: Gastroenterology;;   CHOLECYSTECTOMY N/A 12/17/2020   Procedure: LAPAROSCOPIC CHOLECYSTECTOMY, LIVER BIOPSY, PRIMARY UMBILICAL HERNIA REPAIR;  Surgeon: Armandina Gemma, MD;  Location: [facility];  Service: General;  Laterality: N/A;   COLONOSCOPY WITH PROPOFOL N/A 05/08/2021   Procedure: COLONOSCOPY WITH PROPOFOL;  Surgeon: Carol Ada, MD;  Location: [facility];  Service: Endoscopy;  Laterality:  N/A;   CORONARY STENT INTERVENTION N/A 11/21/2021   Procedure: CORONARY STENT INTERVENTION;  Surgeon: Nigel Mormon, MD;  Location: [facility];  Service: Cardiovascular;  Laterality: N/A;   ENDOSCOPIC RETROGRADE CHOLANGIOPANCREATOGRAPHY (ERCP) WITH PROPOFOL N/A 12/14/2020   Procedure: ENDOSCOPIC RETROGRADE CHOLANGIOPANCREATOGRAPHY (ERCP) WITH PROPOFOL;  Surgeon: Jackquline Denmark, MD;  Location: [facility];  Service: Endoscopy;  Laterality: N/A;   ESOPHAGOGASTRODUODENOSCOPY N/A 09/05/2021   Procedure: ESOPHAGOGASTRODUODENOSCOPY (EGD);  Surgeon: Irving Copas., MD;  Location: [place];  Service: Gastroenterology;  Laterality: N/A;   HEMOSTASIS CLIP PLACEMENT  09/05/2021   Procedure: HEMOSTASIS CLIP PLACEMENT;  Surgeon: Irving Copas., MD;  Location: [facility];  Service: Gastroenterology;;   INTRAVASCULAR ULTRASOUND/IVUS N/A 11/21/2021   Procedure: Intravascular Ultrasound/IVUS;  Surgeon: Nigel Mormon, MD;  Location: [facility];  Service: Cardiovascular;  Laterality: N/A;   LEFT HEART CATH AND CORONARY ANGIOGRAPHY N/A 08/12/2021   Procedure: LEFT HEART CATH AND CORONARY ANGIOGRAPHY;  Surgeon: Adrian Prows, MD;  Location: [facility] [facility];  Service: Cardiovascular;  Laterality: N/A;   LEFT HEART CATH AND CORONARY ANGIOGRAPHY N/A 11/21/2021   Procedure: LEFT HEART CATH AND CORONARY ANGIOGRAPHY;  Surgeon: Nigel Mormon, MD;  Location: [place] [facility];  Service: Cardiovascular;  Laterality: N/A;   POLYPECTOMY  05/08/2021   Procedure: POLYPECTOMY;  Surgeon: Carol Ada, MD;  Location: [facility];  Service: Endoscopy;;   POLYPECTOMY  09/05/2021   Procedure: POLYPECTOMY;  Surgeon: Lemar Lofty., MD;  Location: [facility];  Service: Gastroenterology;;   REMOVAL OF STONES  12/14/2020   Procedure: REMOVAL OF STONES;  Surgeon: Lynann Bologna, MD;  Location: [facility];  Service: Endoscopy;;   SPHINCTEROTOMY  12/14/2020   Procedure:  Dennison Mascot;  Surgeon: Lynann Bologna, MD;  Location: [facility];  Service: Endoscopy;;   Social History:  reports that he has never smoked. He has never used smokeless tobacco. He reports that he does not currently use alcohol. He reports that he does not use drugs.  Allergies  Allergen Reactions   Metoclopramide Itching    Loss of Balance; Urinary Incontinence   Nsaids     Other reaction(s): Other (See Comments) Renal Insufficiency Kidney issues    Amoxicillin-Pot Clavulanate Diarrhea and Nausea And Vomiting    Other reaction(s): Other (See Comments) Abdomen Pain Abdomen pain    Statins     Other reaction(s): Other (See Comments) Myalgias and Myositis myalgias    Allopurinol Rash   Empagliflozin     Other reaction(s): Other (See Comments) Fainting, weakness, lightheaded, falling   Tiotropium     Other reaction(s): Other (See Comments) Urinary retention    Family History  Problem Relation Age of Onset   Heart failure Mother    Heart disease Mother    Cancer Father     Prior to Admission medications   Medication Sig Start Date End Date Taking? Authorizing Provider  acetaminophen (TYLENOL) 500 MG tablet Take 1,000 mg by mouth every 8 (eight) hours as needed for moderate pain.    [provider]  Alpha-Lipoic Acid 300 MG TABS Take 600 mg by mouth daily.    [provider]  benzonatate (TESSALON) 200 MG capsule Take 200 mg by mouth every 8 (eight) hours as needed for cough. 11/18/20   [provider]  bisacodyl (DULCOLAX) 10 MG suppository Place 1 suppository (10 mg total) rectally daily as needed for up to 10 doses for moderate constipation. 05/06/21   Lanae Boast, MD  Cholecalciferol 25 MCG (1000 UT) capsule Take 4,000 Units by mouth daily.    [provider]  clopidogrel (PLAVIX) 75 MG tablet TAKE ONE (1) TABLET BY MOUTH EVERY DAY 03/13/22   Tolia, Sunit, DO  colchicine-probenecid 0.5-500 MG tablet Take 1 tablet by mouth daily. 11/21/20    [provider]  cyanocobalamin (,VITAMIN B-12,) 1000 MCG/ML injection Inject 1,000 mcg into the skin every 30 (thirty) days. 09/23/20   [provider]  ELIQUIS 5 MG TABS tablet Take 1 tablet (5 mg total) by mouth 2 (two) times daily. Resume on 05/11/21 Patient taking differently: Take 5 mg by mouth 2 (two) times daily. 05/11/21   Lanae Boast, MD  ENTRESTO 24-26 MG TAKE ONE TABLET BY MOUTH TWICE DAILY 04/13/22   Tolia, Sunit, DO  Evolocumab (REPATHA SURECLICK) 140 MG/ML SOAJ Inject 1 mL into the skin every 14 (fourteen) days. 08/27/21   Yates Decamp, MD  fenofibrate 160 MG tablet Take 160 mg by mouth daily. 12/04/20   [provider]  fluticasone (FLONASE) 50 MCG/ACT nasal spray Place 1 spray into both nostrils as needed. 07/08/21   [provider]  latanoprost (XALATAN) 0.005 % ophthalmic solution Place 1 drop into the left eye at bedtime. 10/29/20   [provider]  loratadine (CLARITIN) 10 MG tablet Take 1 tablet by mouth daily. 01/20/22   [provider]  magnesium oxide (MAG-OX) 400 MG tablet Take 1 tablet (400 mg total) by mouth 3 (three) times  daily. 03/18/22   Terri Skains, Sunit, DO  meclizine (ANTIVERT) 25 MG tablet Take 25 mg by mouth 3 (three) times daily as needed for nausea.    [provider]  Melatonin 5 MG CHEW Chew 1 tablet by mouth daily. 01/20/22   [provider]  metoCLOPramide (REGLAN) 5 MG tablet Take 2.5 mg by mouth at bedtime. 10/09/20   [provider]  metoprolol succinate (TOPROL-XL) 25 MG 24 hr tablet TAKE 1/2 TABLET BY MOUTH DAILY 03/26/22   Tolia, Sunit, DO  montelukast (SINGULAIR) 10 MG tablet Take 10 mg by mouth at bedtime. 09/28/20   [provider]  nitroGLYCERIN (NITROSTAT) 0.4 MG SL tablet Place 0.4 mg under the tongue every 5 (five) minutes as needed for chest pain.    [provider]  NOVOLOG MIX 70/30 FLEXPEN (70-30) 100 UNIT/ML FlexPen Inject 30 Units into the skin 2 (two) times  daily with a meal. Patient taking differently: Inject 40-85 Units into the skin 2 (two) times daily with a meal. Sliding scale 05/09/21   Antonieta Pert, MD  ondansetron (ZOFRAN-ODT) 4 MG disintegrating tablet Take 1 tablet (4 mg total) by mouth every 8 (eight) hours as needed for nausea or vomiting. 09/05/21   Sharen Hones, MD  pantoprazole (PROTONIX) 40 MG tablet Take 1 tablet (40 mg total) by mouth 2 (two) times daily. 09/05/21   Sharen Hones, MD  polyethylene glycol (MIRALAX / GLYCOLAX) 17 g packet Take 17 g by mouth 2 (two) times daily. Patient taking differently: Take 17 g by mouth as needed. 11/27/21   Pokhrel, Corrie Mckusick, MD  Polyethylene Glycol 400 (BLINK TEARS) 0.25 % SOLN Place 1 drop into both eyes daily as needed (dry eyes).    [provider]  PROAIR RESPICLICK 123XX123 (90 Base) MCG/ACT AEPB Inhale 2 puffs into the lungs every 6 (six) hours as needed (sob/wheezing). 04/03/21   [provider]  Probiotic Product (ALIGN) 4 MG CAPS Take 4 mg by mouth daily.    [provider]  senna-docusate (SENOKOT-S) 8.6-50 MG tablet Take 1 tablet by mouth daily.    [provider]  sodium chloride (MURO 128) 5 % ophthalmic ointment Place 1 application. into both eyes as needed.    [provider]  sucralfate (CARAFATE) 1 GM/10ML suspension Take 10 mLs (1 g total) by mouth 2 (two) times daily. 09/05/21   Sharen Hones, MD  torsemide (DEMADEX) 10 MG tablet Take 0.5 tablets (5 mg total) by mouth daily. 04/15/22   Tolia, Sunit, DO  VASCEPA 1 g capsule TAKE ONE TABLET BY MOUTH TWICE DAILY 04/07/22   Rex Kras, DO    Physical Exam: Vitals:   04/16/22 0100 04/16/22 0110 04/16/22 0130 04/16/22 0200  BP: (!) 109/55 (!) 104/51 94/64 105/60  Pulse: 67 68 69 65  Resp: 18 16 18 14   Temp:      TempSrc:      SpO2: 98% 99% 99% 98%  Weight:      Height:       Constitutional: NAD, calm, comfortable Eyes: PERRL, lids and conjunctivae normal ENMT: Mucous membranes are moist.  Posterior pharynx clear of any exudate or lesions.Normal dentition.  Neck: normal, supple, no masses, no thyromegaly Respiratory: clear to auscultation bilaterally, no wheezing, no crackles. Normal respiratory effort. No accessory muscle use.  Cardiovascular: Regular rate and rhythm, no murmurs / rubs / gallops. No extremity edema. 2+ pedal pulses. No carotid bruits.  Abdomen: no tenderness, no masses palpated. No hepatosplenomegaly. Bowel sounds positive.  Musculoskeletal:  no clubbing / cyanosis. No joint deformity upper and lower extremities. Good ROM, no contractures. Normal muscle tone.  Skin: no rashes, lesions, ulcers. No induration Neurologic: CN 2-12 grossly intact. Sensation intact, DTR normal. Strength 5/5 in all 4.  Psychiatric: Normal judgment and insight. Alert and oriented x 3. Normal mood.   Data Reviewed:       Latest Ref Rng & Units 04/16/2022    2:52 AM 04/15/2022    7:54 PM 01/26/2022    7:03 PM  CMP  Glucose 70 - 99 mg/dL 300  923  300   BUN 8 - 23 mg/dL 64  71  34   Creatinine 0.61 - 1.24 mg/dL 7.62  2.63  3.35   Sodium 135 - 145 mmol/L 136  135  136   Potassium 3.5 - 5.1 mmol/L 5.2  5.7  5.4   Chloride 98 - 111 mmol/L 103  102  105   CO2 22 - 32 mmol/L 26  25  21    Calcium 8.9 - 10.3 mg/dL 8.7  8.7  8.4   Total Protein 6.5 - 8.1 g/dL   5.9   Total Bilirubin 0.3 - 1.2 mg/dL   1.6   Alkaline Phos 38 - 126 U/L   34   AST 15 - 41 U/L   30   ALT 0 - 44 U/L   18    Trops: 42, 89, 163, and 160  Assessment and Plan: * NSTEMI (non-ST elevated myocardial infarction) (HCC) HPI + rising trops = concern for mild NSTEMI. ACS pathway Hold eliquis and using heparin gtt instead for the moment Continue plavix (2 stents in March) Pt not having STEMI at the moment CP resolved at the moment and asymptomatic at rest Doubt he had a stent go down or anything Trend trops EDP spoke with Dr. April Regarding alternative possible causes of symptoms and trop elevation: Checking  lipase but L arm pain he had doesn't really sound like pancreatitis, no h/o pancreatitis, does have very recent h/o CAD "in spades" so to speak D.Dimer is very negative  < 0.27 and patient is on eliquis, hasnt missed a dose: PE seems very unlikely Resolution of symptoms makes aortic dissection also seem unlikely.  AKI (acute kidney injury) (HCC) Appears to have pre-renal azotemia / AKI likely in setting of mild hypotension. Hold home BP meds Hold entresto Hold torsemide Got 500cc bolus in ED Strict intake and output Got lokelma for K of 5.7 Repeat BMP in AM  Hyperkalemia See AKI above Got lokelma x1 dose Repeat BMP in AM Holding entresto  Acute on chronic HFrEF (heart failure with reduced ejection fraction) (HCC) Does appear to have mild fluid overload today (peripheral edema mostly).  However 1) no pulm edema 2) BNP at baseline 3) no SOB at rest at the moment, laying flat in bed Therefore: Holding torsemide for the moment and got 500cc bolus in ED due to hypotension and AKI Holding entresto in setting of AKI + hyperkalemia.  Ambulatory dysfunction Pt is bed bound at baseline.  Wound of back Ongoing since March.  Open wound from where he had cyst removed back in 2020.  Wound likely opened up due to being bed bound Not infected or anything. Will put in for wound care consult.  Essential hypertension BP running low in ED today. Holding home BP meds for the moment.  Dyslipidemia Looks like he is on repatha, med-rec pending.  Stage 3a chronic kidney disease (CKD) (HCC) Pt appears to have AKI  today, see above.  History of pulmonary embolism Hold eliquis, using heparin gtt instead due to NSTEMI See NSTEMI above, pt ruled out for PE at this point with negative D.Dimer.  Also on chronic eliquis with no missed doses.  Type 2 diabetes mellitus with hyperlipidemia ([place]) Hold home 70/30 Putting on low dose SSI for the moment while NPO      Advance Care Planning:   Code  Status: Full Code  Consults: Dr. Terri Skains called by EDP  Family Communication: Wife at bedside  Severity of Illness: The appropriate patient status for this patient is OBSERVATION. Observation status is judged to be reasonable and necessary in order to provide the required intensity of service to ensure the patient's safety. The patient's presenting symptoms, physical exam findings, and initial radiographic and laboratory data in the context of their medical condition is felt to place them at decreased risk for further clinical deterioration. Furthermore, it is anticipated that the patient will be medically stable for discharge from the hospital within 2 midnights of admission.   Author: Etta Quill., DO 04/16/2022 2:12 AM  For on call review [email or web address].

## 2022-04-16 NOTE — Consult Note (Signed)
WOC Nurse Consult Note: Reason for Consult: Consult requested for left posterior shoulder.  Pt is familiar to [facility] team from previous admission on 3/13.  Wife at bedside in the ED is well-informed regarding wound and topical treatment. 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 100% red and moist, mod amt pink drainage, .3X.3X.3cm Dressing procedure/placement/frequency: Continue present plan of care.  Topical treatment orders provided for bedside nurses to perform as follows: Pack left posterior shoulder wound Q day (Use packing strip Lawson # [phone] and swab to fill) Cover with foam dressing and change foam dressing Q 3 days or PRN soiling.) Please re-consult if further assistance is needed.  Thank-you,  [name] MSN, RN, CWOCN, [place], CNS [phone]

## 2022-04-16 NOTE — Assessment & Plan Note (Signed)
Appears to have pre-renal azotemia / AKI likely in setting of mild hypotension. 1. Hold home BP meds 2. Hold entresto 3. Hold torsemide 4. Got 500cc bolus in ED 5. Strict intake and output 6. Got lokelma for K of 5.7 7. Repeat BMP in AM

## 2022-04-16 NOTE — Assessment & Plan Note (Signed)
Ongoing since March.  Open wound from where he had cyst removed back in 2020.  Wound likely opened up due to being bed bound Not infected or anything. 1. Will put in for wound care consult.

## 2022-04-16 NOTE — Assessment & Plan Note (Signed)
Looks like he is on repatha, med-rec pending.

## 2022-04-16 NOTE — Progress Notes (Signed)
  Echocardiogram 2D Echocardiogram has been performed.  [name] 04/16/2022, 11:45 AM

## 2022-04-16 NOTE — Consult Note (Signed)
CARDIOLOGY CONSULT NOTE  Patient ID: Randy Liu MRN: [id number] DOB/AGE: September 22, 1950 71 y.o.  Admit date: 04/15/2022 Attending physician: Thurnell Lose, MD Primary Physician:  [name], MD Outpatient Cardiologist: Rex Kras, DO, [facility] Inpatient Cardiologist: Rex Kras, DO, [facility]  Reason of consultation: NSTEMI Referring physician: Dr. Margaretmary Eddy  Chief complaint: chest pain   HPI:  Randy Liu is a 71 y.o. Caucasian male who presents with a chief complaint of " chest pain." His past medical history and cardiovascular risk factors include:  chronically bedbound, type 2 diabetes, gastroparesis, hyperlipidemia, statin intolerance, CKD stage IIIa, history of DVT on Eliquis, severe peripheral neuropathy, nonhealing GI ulcers status post duodenal polyp clipping, CAD status post PCI, ischemic cardiomyopathy, chronic systolic and diastolic heart failure, sleep apnea on CPAP.  Patient is accompanied by his wife Randy Liu who is present at bedside.  Patient has been experiencing viral infection for the last couple weeks and was given oral prednisone and antibiotics x2 as outpatient.  Overall improving but continues to have nonproductive cough.  He was in his usual state of health until yesterday around 2 PM after having lunch he started having chest pain.  Initially thought secondary to dysphagia and his wife tried to nursing at home.  He continued to have nauseous like sensation, episodes of dry heaving, and another episode of precordial pain around 5 PM which relieved by nitroglycerin tablets and therefore he comes to the ED via EMS for further evaluation and management.  In March 2023 he presented with non-STEMI and was noted to have obstructive CAD on angiography.  Given the fact that he is chronically bedbound due to neuropathy patient was not an ideal candidate for CABG and underwent high risk PCI.  Since then has been managed medically on outpatient basis until now.  At  the time of evaluation he denies chest pain/anginal discomfort/shortness of breath.  Patient states that his symptoms are not as severe as they were back in March 2023 when he presented with NSTEMI and required coronary intervention.  He has been compliant with his oral anticoagulation and antiplatelet therapy.  Work-up in the ED illustrates elevated high sensitive troponins and BNP.  Both troponins and BNP are not as elevated compared to his prior hospitalizations.  He also has renal insufficiency, hyperkalemia, and leukocytosis.  ALLERGIES: Allergies  Allergen Reactions   Nsaids Other (See Comments)    Renal Insufficiency Kidney issues    Reglan [Metoclopramide] Itching    Loss of Balance Urinary Incontinence Can tolerate low dose at bedtime   Augmentin [Amoxicillin-Pot Clavulanate] Diarrhea and Nausea And Vomiting    Abdomen pain    Statins Other (See Comments)    Myalgias Myositis     Chocolate Itching    Can tolerate if taking Benadryl   Other Other (See Comments)    All nuts - told to avoid due to GI tract issues   Spiriva Respimat [Tiotropium Bromide Monohydrate] Other (See Comments)    Urinary retention   Strawberry (Diagnostic) Itching    Can tolerate if taking Benadryl   Jardiance [Empagliflozin] Other (See Comments)    Fainting Weakness  Lightheaded  Falling    Zyloprim [Allopurinol] Rash    PAST MEDICAL HISTORY: Past Medical History:  Diagnosis Date   Anal fissure    Asthma    Cardiomyopathy ([place])    CHF (congestive heart failure) (HCC)    Coronary artery disease    Diabetes mellitus without complication (HCC)    History of DVT (deep vein thrombosis)  History of ulcer disease    Hyperlipidemia    Hypertension    Peripheral neuropathy    Sleep apnea     PAST SURGICAL HISTORY: Past Surgical History:  Procedure Laterality Date   BIOPSY  12/14/2020   Procedure: BIOPSY;  Surgeon: Jackquline Denmark, MD;  Location: [facility];  Service: Endoscopy;;    BIOPSY  09/05/2021   Procedure: BIOPSY;  Surgeon: Irving Copas., MD;  Location: [place];  Service: Gastroenterology;;   CHOLECYSTECTOMY N/A 12/17/2020   Procedure: LAPAROSCOPIC CHOLECYSTECTOMY, LIVER BIOPSY, PRIMARY UMBILICAL HERNIA REPAIR;  Surgeon: Armandina Gemma, MD;  Location: [facility];  Service: General;  Laterality: N/A;   COLONOSCOPY WITH PROPOFOL N/A 05/08/2021   Procedure: COLONOSCOPY WITH PROPOFOL;  Surgeon: Carol Ada, MD;  [facility];  Service: Endoscopy;  Laterality: N/A;   CORONARY STENT INTERVENTION N/A 11/21/2021   Procedure: CORONARY STENT INTERVENTION;  Surgeon: Nigel Mormon, MD;  Location: [place] [facility];  Service: Cardiovascular;  Laterality: N/A;   ENDOSCOPIC RETROGRADE CHOLANGIOPANCREATOGRAPHY (ERCP) WITH PROPOFOL N/A 12/14/2020   Procedure: ENDOSCOPIC RETROGRADE CHOLANGIOPANCREATOGRAPHY (ERCP) WITH PROPOFOL;  Surgeon: Jackquline Denmark, MD;  [facility];  Service: Endoscopy;  Laterality: N/A;   ESOPHAGOGASTRODUODENOSCOPY N/A 09/05/2021   Procedure: ESOPHAGOGASTRODUODENOSCOPY (EGD);  Surgeon: Irving Copas., MD;  Location: [place];  Service: Gastroenterology;  Laterality: N/A;   HEMOSTASIS CLIP PLACEMENT  09/05/2021   Procedure: HEMOSTASIS CLIP PLACEMENT;  Surgeon: Irving Copas., MD;  Location: [place];  Service: Gastroenterology;;   INTRAVASCULAR ULTRASOUND/IVUS N/A 11/21/2021   Procedure: Intravascular Ultrasound/IVUS;  Surgeon: Nigel Mormon, MD;  Location: [place] [facility];  Service: Cardiovascular;  Laterality: N/A;   LEFT HEART CATH AND CORONARY ANGIOGRAPHY N/A 08/12/2021   Procedure: LEFT HEART CATH AND CORONARY ANGIOGRAPHY;  Surgeon: Adrian Prows, MD;  Location: [place] [facility];  Service: Cardiovascular;  Laterality: N/A;   LEFT HEART CATH AND CORONARY ANGIOGRAPHY N/A 11/21/2021   Procedure: LEFT HEART CATH AND CORONARY ANGIOGRAPHY;  Surgeon: Nigel Mormon, MD;  Location: [place] [facility];  Service: Cardiovascular;  Laterality: N/A;   POLYPECTOMY  05/08/2021   Procedure: POLYPECTOMY;  Surgeon: Carol Ada, MD;  [facility];  Service: Endoscopy;;   POLYPECTOMY  09/05/2021   Procedure: POLYPECTOMY;  Surgeon: Irving Copas., MD;  Location: [place];  Service: Gastroenterology;;   REMOVAL OF STONES  12/14/2020   Procedure: REMOVAL OF STONES;  Surgeon: Jackquline Denmark, MD;  [facility];  Service: Endoscopy;;   SPHINCTEROTOMY  12/14/2020   Procedure: Joan Mayans;  Surgeon: Jackquline Denmark, MD;  [facility];  Service: Endoscopy;;    FAMILY HISTORY: The patient's family history includes Cancer in his father; Heart disease in his mother; Heart failure in his mother.   SOCIAL HISTORY:  The patient  reports that he has never smoked. He has never used smokeless tobacco. He reports that he does not currently use alcohol. He reports that he does not use drugs.  MEDICATIONS: Current Outpatient Medications  Medication Instructions   acetaminophen (TYLENOL) 1,000 mg, Oral, Every 8 hours PRN   albuterol (VENTOLIN HFA) 108 (90 Base) MCG/ACT inhaler 2 puffs, Inhalation, Every 6 hours PRN   Albuterol Sulfate (PROAIR RESPICLICK) 025 (90 Base) MCG/ACT AEPB 2 puffs, Inhalation, Every 6 hours PRN   Align 4 mg, Oral, Daily   Alpha-Lipoic Acid 600 mg, Oral, Daily   benzonatate (TESSALON) 200 mg, Oral, Every 8 hours PRN   Cholecalciferol 4,000 Units, Oral, Daily   clopidogrel (PLAVIX)  75 MG tablet TAKE ONE (1) TABLET BY MOUTH EVERY DAY   colchicine-probenecid 0.5-500 MG tablet 1 tablet, Oral, Daily   cyanocobalamin (VITAMIN B12) 1,000 mcg, Subcutaneous, Every 30 days   Eliquis 5 mg, Oral, 2 times daily, Resume on 05/11/21   ENTRESTO 24-26 MG TAKE ONE TABLET BY MOUTH TWICE DAILY   Evolocumab (REPATHA SURECLICK) 035 MG/ML SOAJ 1 mL, Subcutaneous, Every 14 days   famotidine (PEPCID) 20 mg, Oral, Daily PRN   fenofibrate 160 mg, Oral, Daily   fluticasone  (FLONASE) 50 MCG/ACT nasal spray 1 spray, Each Nare, As needed   guaiFENesin-codeine 100-10 MG/5ML syrup 5 mLs, Oral, 3 times daily PRN   latanoprost (XALATAN) 0.005 % ophthalmic solution 1 drop, Left Eye, Daily at bedtime   loratadine (CLARITIN) 10 mg, Oral, Daily   magnesium oxide (MAG-OX) 400 mg, Oral, 3 times daily   meclizine (ANTIVERT) 25 mg, Oral, 3 times daily PRN   melatonin 5 mg, Oral, At bedtime PRN   metoCLOPramide (REGLAN) 2.5 mg, Oral, Daily at bedtime   metoprolol succinate (TOPROL-XL) 25 MG 24 hr tablet TAKE 1/2 TABLET BY MOUTH DAILY   montelukast (SINGULAIR) 10 mg, Oral, Daily at bedtime   nitroGLYCERIN (NITROSTAT) 0.4 mg, Sublingual, Every 5 min PRN   NOVOLOG MIX 70/30 FLEXPEN (70-30) 100 UNIT/ML FlexPen 30 Units, Subcutaneous, 2 times daily with meals   ondansetron (ZOFRAN-ODT) 4 mg, Oral, Every 8 hours PRN   pantoprazole (PROTONIX) 40 mg, Oral, 2 times daily   polyethylene glycol (MIRALAX / GLYCOLAX) 17 g, Oral, 2 times daily   sennosides-docusate sodium (SENOKOT-S) 8.6-50 MG tablet 1 tablet, Oral, Daily   sodium chloride (MURO 128) 5 % ophthalmic ointment 1 application , Both Eyes, At bedtime PRN   sucralfate (CARAFATE) 1 g, Oral, 2 times daily   torsemide (DEMADEX) 5 mg, Oral, Daily   Vascepa 1 g, Oral, 2 times daily    REVIEW OF SYSTEMS: Review of Systems  Constitutional: Positive for malaise/fatigue.  Cardiovascular:  Positive for chest pain (Prior to arrival, now asymptomatic) and leg swelling. Negative for claudication, dyspnea on exertion, irregular heartbeat, near-syncope, orthopnea, palpitations, paroxysmal nocturnal dyspnea and syncope.  Respiratory:  Positive for cough and shortness of breath.   Hematologic/Lymphatic: Negative for bleeding problem.  Musculoskeletal:  Positive for back pain and joint pain. Negative for muscle cramps and myalgias.  Neurological:  Negative for dizziness and light-headedness.  All other systems reviewed and are  negative.   PHYSICAL EXAM:    04/16/2022    8:47 AM 04/16/2022    7:00 AM 04/16/2022    6:45 AM  Vitals with BMI  Systolic 597 416 384  Diastolic 52 55 54  Pulse 71 67 118     Intake/Output Summary (Last 24 hours) at 04/16/2022 0858 Last data filed at 04/16/2022 0710 Gross per 24 hour  Intake --  Output 925 ml  Net -925 ml    Net IO Since Admission: -925 mL [04/16/22 0858]  CONSTITUTIONAL: Appears older than stated age, chronically bedbound, hemodynamically stable, no acute distress.   SKIN: Skin is warm and dry. No rash noted. No cyanosis. No pallor. No jaundice.   posterior wound HEAD: Normocephalic and atraumatic.  EYES: No scleral icterus MOUTH/THROAT: Moist oral membranes.  NECK: Difficult to evaluate JVP as he is supine and adipose tissue, no thyromegaly noted. No carotid bruits  CHEST Normal respiratory effort. No intercostal retractions  LUNGS: Good air entry bilaterally, rise and fall in chest cavity, rales noted bilaterally. CARDIOVASCULAR: Regular rate  and rhythm, positive W6-F6, soft systolic murmur, no gallops or rubs. ABDOMINAL: Obese, soft, nontender, nondistended, positive bowel sounds in all 4 quadrants, no apparent ascites.  EXTREMITIES: 2+ bilateral pitting edema, warm to touch, Unna boots present.  HEMATOLOGIC: No significant bruising NEUROLOGIC: Oriented to person, place, and time. Nonfocal. Normal muscle tone.  PSYCHIATRIC: Normal mood and affect. Normal behavior. Cooperative  RADIOLOGY: DG Abd Portable 1V  Result Date: 04/16/2022 CLINICAL DATA:  71 year old male with a history of ileus EXAM: PORTABLE ABDOMEN - 1 VIEW COMPARISON:  08/10/2021 FINDINGS: Paucity of gas within stomach and small bowel. No air-fluid levels. No evidence of distension. Small volume of colonic gas within right colon, transverse colon, left colon. Overlying EKG leads. No unexpected radiopaque foreign body or calcification. No significant stool burden. IMPRESSION: Nonspecific bowel gas  pattern without gaseous distension. Electronically Signed   By: Corrie Mckusick D.O.   On: 04/16/2022 08:30   DG Chest 2 View  Result Date: 04/15/2022 CLINICAL DATA:  Chest pain EXAM: CHEST - 2 VIEW COMPARISON:  Radiographs 11/24/2021 FINDINGS: Left basilar atelectasis similar to prior. No pleural effusion, or pneumothorax. Normal cardiomediastinal silhouette. No acute osseous abnormality. Aortic atherosclerotic calcification. IMPRESSION: Scar versus atelectasis in the left base, similar to prior. Electronically Signed   By: Placido Sou M.D.   On: 04/15/2022 20:13    LABORATORY DATA: Lab Results  Component Value Date   WBC 12.4 (H) 04/15/2022   HGB 11.9 (L) 04/15/2022   HCT 36.8 (L) 04/15/2022   MCV 93.2 04/15/2022   PLT 301 04/15/2022    Recent Labs  Lab 04/16/22 0252  NA 136  K 5.2*  CL 103  CO2 26  BUN 64*  CREATININE 1.73*  CALCIUM 8.7*  GLUCOSE 196*    Lipid Panel  Lab Results  Component Value Date   CHOL 151 11/21/2021   HDL 19 (L) 11/21/2021   LDLCALC 84 11/21/2021   TRIG 241 (H) 11/21/2021   CHOLHDL 7.9 11/21/2021    BNP (last 3 results) Recent Labs    11/21/21 1915 11/23/21 1003 04/15/22 1954  BNP 314.3* 880.6* 147.9*    HEMOGLOBIN A1C Lab Results  Component Value Date   HGBA1C 6.7 (H) 11/21/2021   MPG 145.59 11/21/2021    Cardiac Panel (last 3 results) Recent Labs    04/16/22 0204 04/16/22 0252 04/16/22 0659  TROPONINIHS 163* 160* 151*     TSH No results for input(s): "TSH" in the last 8760 hours.   CARDIAC DATABASE: EKG: 04/16/2022: Sinus rhythm, 96 bpm, poor wave progression, consider old lateral infarct.  04/16/2022: Sinus rhythm, 69 bpm, poor R wave progression, left axis deviation, left anterior fascicular block, consider old lateral infarct.   Echocardiogram: 08/10/2021: LVEF 55 to 60%, trivial MR, estimated RAP 8 mmHg. 11/21/2021: LVEF 20-25%, severely reduced, global hypokinesis, RV systolic function mildly reduced, chronic  pericardial fat pad.  11/25/2021: LVEF 40-45%.    Heart Catheterization: Coronary intervention 11/21/2021: LM: Normal LAD: Prox 100% occlusion         Diag 1 70% stenosis, followed by 100% occlusion Lcx: 99% stenosis in prox OM1 with minimal flow distally, which has progressed from previous angiogram in 07/2021        OM2 occluded RCA: Prox 50% stenosis (Present but under appreciated on previous angiogram in 07/2021)         Mid 75% stenosis (Present but under appreciated on previous angiogram in 07/2021)         This lesion either was more severe  and occlusive with wires and balloons or developed spasm or dissection to render it flow limiting during the procedure, thus requiring stenting         Distal RCA into RPDA with 95% stenosis         Ostial-prox RPLA 90% stenosis   Successful IVUS guided percutaneous coronary intervention      PTCA RPLA ostium with 2.0 X 8 mm balloon     PTA and stent placement 2.5 X 18 mm Onyx Frontier DES distal RCA/RPDA             Post dilatation with 2.5 X 8 mm Felton balloon at 18 atm     PTCA and stent placement 3.0 X 18 mm Onyx Frontier DES mid RCA             Post dilatation with 3.5 X 12 mm Johnson balloon at 22 atm   Intraprocedural hypotension and shock requiring IV norepinephrine up to 30 mics, now completely weaned off Intraprocedural STE still persist, likely reflect intraprocedural no flow to RCA and to LAD via collaterals, likely causing myocardial stunning. Chest pain down from 7/10 to 1-2/10   He will need close monitoring of his chest pain, EKG, hemodynamics, urine output   Complex procedure due to intra procedural chest pain and ST elevation, diagnostic challenges responsible for slow flow, intra procedural hypotension requiring norepinephrine  IMPRESSION & RECOMMENDATIONS: Randy Liu is a 71 y.o. Caucasian male past medical history and cardiac risk factors include: who is chronically bedbound, type 2 diabetes, gastroparesis, hyperlipidemia,  statin intolerance, CKD stage IIIa, history of DVT on Eliquis, severe peripheral neuropathy, nonhealing GI ulcers status post duodenal polyp clipping, CAD status post PCI, ischemic cardiomyopathy, chronic systolic and diastolic heart failure, sleep apnea on CPAP.   Impression:  Precordial pain.  Elevated troponins likely secondary to supply demand ischemia -in the setting of multivessel CAD, ischemic cardiomyopathy, chronic combined systolic and diastolic heart failure, recent viral infection, and reduced clearance secondary to renal insufficiency  Established coronary artery disease with prior coronary intervention and free of angina pectoris currently.  Ischemic cardiomyopathy  Benign essential hypertension.  Hypercholesterolemia with history of statin intolerance  Sleep apnea on CPAP  Renal insufficiency.  Leukocytosis.   Plan:  Given his presentation and complex cardiovascular history cardiology has been asked to evaluate the patient during the current hospitalization.  Patient presents to the hospital with precordial pain in the setting of recent viral infection and episode of dysphagia leading to nausea and dry heaving.  Patient is chronically bedbound due to peripheral neuropathy and unable to comment on exertional chest pain or shortness of breath.  EKG shows sinus rhythm without underlying ischemia or injury pattern.  High sensitive troponins peaked at 163 and currently downtrending.  Would like to continue IV heparin per ACS protocol until the surface echocardiogram is complete to further evaluate his LVEF and regional wall motion abnormalities.  He is currently chest pain-free.  Patient chronically runs a soft blood pressure with SBP around 110 mmHg.    Given the recent viral illness and the use of oral steroids he does have significant bilateral lower extremity swelling, and rales on physical examination.  BNP is elevated but less compared to prior visits.  Home GDMT has  been limited due to soft blood pressures.  At home he takes low-dose Entresto, torsemide and Toprol-XL 12.5 mg p.o. daily.  Recommend bilateral compression stockings.  Already given IV Lasix x1.  We will start Bumex 0.5 mg IV  push daily.  Strict I's and O's and daily weights.  COVID negative and respiratory pathogen panel negative as well.  Further recommendations to follow-up after the completion of the echocardiogram.  In the meantime monitor him on telemetry, continue diuresis, and follow kidney function and hemodynamics.   Total encounter time 67 minutes. *Total Encounter Time as defined by the Centers for Medicare and Medicaid Services includes, in addition to the face-to-face time of a patient visit (documented in the note above) non-face-to-face time: obtaining and reviewing outside history, ordering and reviewing medications, tests or procedures, care coordination (communications with ED nursing staff, attending physician Dr. Candiss Norse) and documentation in the medical record.  Patient's questions and concerns were addressed to his satisfaction. He voices understanding of the instructions provided during this encounter.   This note was created using a voice recognition software as a result there may be grammatical errors inadvertently enclosed that do not reflect the nature of this encounter. Every attempt is made to correct such errors.  Mechele Claude [facility]  Pager: [phone] Office: [phone] 04/16/2022, 8:58 AM

## 2022-04-16 NOTE — Progress Notes (Signed)
ANTICOAGULATION CONSULT NOTE - Initial Consult  Pharmacy Consult for Heparin Indication:  chest pain/ACS, history of VTE  Allergies  Allergen Reactions   Nsaids Other (See Comments)    Renal Insufficiency Kidney issues    Reglan [Metoclopramide] Itching    Loss of Balance Urinary Incontinence Can tolerate low dose at bedtime   Augmentin [Amoxicillin-Pot Clavulanate] Diarrhea and Nausea And Vomiting    Abdomen pain    Statins Other (See Comments)    Myalgias Myositis     Chocolate Itching    Can tolerate if taking Benadryl   Other Other (See Comments)    All nuts - told to avoid due to GI tract issues   Spiriva Respimat [Tiotropium Bromide Monohydrate] Other (See Comments)    Urinary retention   Strawberry (Diagnostic) Itching    Can tolerate if taking Benadryl   Jardiance [Empagliflozin] Other (See Comments)    Fainting Weakness  Lightheaded  Falling    Zyloprim [Allopurinol] Rash    Patient Measurements: Height: 6\' 1"  (185.4 cm) Weight: 123.8 kg (273 lb) IBW/kg (Calculated) : 79.9 Heparin Dosing Weight: 107.1 kg  Vital Signs: Temp: 97.6 F (36.4 C) (08/03 1718) Temp Source: Oral (08/03 1718) BP: 106/60 (08/03 1707) Pulse Rate: 68 (08/03 1707)  Labs: Recent Labs    04/15/22 1954 04/15/22 2143 04/16/22 0204 04/16/22 0252 04/16/22 0659 04/16/22 1100 04/16/22 1114 04/16/22 2032  HGB 11.9*  --   --   --   --   --   --   --   HCT 36.8*  --   --   --   --   --   --   --   PLT 301  --   --   --   --   --   --   --   APTT  --   --   --   --   --   --  60* 69*  HEPARINUNFRC  --   --   --   --   --  >1.10*  --   --   CREATININE 1.78*  --   --  1.73*  --   --   --   --   TROPONINIHS 42*   < > 163* 160* 151*  --   --   --    < > = values in this interval not displayed.    Estimated Creatinine Clearance: 52.4 mL/min (A) (by C-G formula based on SCr of 1.73 mg/dL (H)).   Medical History: Past Medical History:  Diagnosis Date   Anal fissure    Asthma     Cardiomyopathy (H[facility])    CHF (congestive heart failure) (HCC)    Coronary artery disease    Diabetes mellitus without complication (HCC)    History of DVT (deep vein thrombosis)    History of ulcer disease    Hyperlipidemia    Hypertension    Peripheral neuropathy    Sleep apnea      Assessment: 71 yom with a history of Hx VTE on apixaban, T2DM, HTN, HFrEF, ICM, NSTEMI, CKD. Patient is presenting with chest pain and cough. Last dose of apixaban 8/2 @ 1100. Heparin per pharmacy consult placed for  chest pain/ACS, history of VTE .   aPTT 69 is at low end of therapeutic on 1400 units/hr.  No issues with infusion or bleeding per RN.  Goal of Therapy:  Heparin level 0.3-0.7 units/ml aPTT 66-102 seconds Monitor platelets by anticoagulation protocol: Yes   Plan:  Increase heparin infusion slightly to 1450 units/hr to keep in goal F/u aPTT until correlates with heparin level  Monitor daily aPTT, heparin level, CBC Monitor for signs/symptoms of bleeding    Alphia Moh, PharmD, BCPS, [facility] Clinical Pharmacist  Please check AMION for all [facility] phone numbers After 10:00 PM, call Main Pharmacy [phone]

## 2022-04-16 NOTE — Assessment & Plan Note (Signed)
Pt appears to have AKI today, see above.

## 2022-04-16 NOTE — Progress Notes (Signed)
[facility] floor coverage: RN called:  N/V episode, NO CP though. Pt takes home antivert scheduled that he says usually can prevent this. 1) ordered home scheduled antivert 2) ordered repeat EKG stat 3) ordered more serial trops

## 2022-04-16 NOTE — Assessment & Plan Note (Signed)
Pt is bed bound at baseline  

## 2022-04-16 NOTE — Assessment & Plan Note (Signed)
BP running low in ED today. 1. Holding home BP meds for the moment.

## 2022-04-16 NOTE — Progress Notes (Signed)
ANTICOAGULATION CONSULT NOTE - Initial Consult  Pharmacy Consult for Heparin Indication:  chest pain/ACS, history of VTE  Allergies  Allergen Reactions   Nsaids Other (See Comments)    Renal Insufficiency Kidney issues    Reglan [Metoclopramide] Itching    Loss of Balance Urinary Incontinence Can tolerate low dose at bedtime   Augmentin [Amoxicillin-Pot Clavulanate] Diarrhea and Nausea And Vomiting    Abdomen pain    Statins Other (See Comments)    Myalgias Myositis     Chocolate Itching    Can tolerate if taking Benadryl   Other Other (See Comments)    All nuts - told to avoid due to GI tract issues   Spiriva Respimat [Tiotropium Bromide Monohydrate] Other (See Comments)    Urinary retention   Strawberry (Diagnostic) Itching    Can tolerate if taking Benadryl   Jardiance [Empagliflozin] Other (See Comments)    Fainting Weakness  Lightheaded  Falling    Zyloprim [Allopurinol] Rash    Patient Measurements: Height: 6\' 1"  (185.4 cm) Weight: 123.8 kg (273 lb) IBW/kg (Calculated) : 79.9 Heparin Dosing Weight: 107.1 kg  Vital Signs: Temp: 97.5 F (36.4 C) (08/03 0847) Temp Source: Oral (08/03 0847) BP: 112/52 (08/03 0847) Pulse Rate: 71 (08/03 0847)  Labs: Recent Labs    04/15/22 1954 04/15/22 2143 04/16/22 0204 04/16/22 0252 04/16/22 0659  HGB 11.9*  --   --   --   --   HCT 36.8*  --   --   --   --   PLT 301  --   --   --   --   CREATININE 1.78*  --   --  1.73*  --   TROPONINIHS 42*   < > 163* 160* 151*   < > = values in this interval not displayed.    Estimated Creatinine Clearance: 52.4 mL/min (A) (by C-G formula based on SCr of 1.73 mg/dL (H)).   Medical History: Past Medical History:  Diagnosis Date   Anal fissure    Asthma    Cardiomyopathy (HCC)    CHF (congestive heart failure) (HCC)    Coronary artery disease    Diabetes mellitus without complication (HCC)    History of DVT (deep vein thrombosis)    History of ulcer disease     Hyperlipidemia    Hypertension    Peripheral neuropathy    Sleep apnea     Medications:  (Not in a hospital admission)  Scheduled:   clopidogrel  75 mg Oral Daily   insulin aspart  0-9 Units Subcutaneous Q4H   Infusions:   heparin 1,200 Units/hr (04/16/22 0242)   PRN: acetaminophen, nitroGLYCERIN, ondansetron (ZOFRAN) IV  Assessment: 71 yom with a history of 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 Patient is presenting with chest pain and cough. Heparin per pharmacy consult placed for  chest pain/ACS, history of VTE .  Patient is on eliquis prior to arrival. Last dose 8/2 at 1100. Will require aPTT monitoring due to likely falsely high anti-Xa level secondary to DOAC use.  Patient started on 1200 units/hr IV heparin without an initial bolus. Resulting aPTT is 60 which is sub-therapeutic. HL is elevated secondary to DOAC usage.  No issues with bleeding or infusion per RN.  Hgb 11.9; plt 301 hsTrop 89>151  Goal of Therapy:  Heparin level 0.3-0.7 units/ml aPTT 66-102 seconds Monitor platelets by anticoagulation protocol: Yes   Plan:  Will bolus 1500 units IV heparin bolus Increase  heparin infusion to 1400 units/hr Check aPTT & anti-Xa level at 8 and daily while on heparin Continue to monitor via aPTT until levels are correlated Continue to monitor H&H and platelets  Delmar Landau, PharmD, BCPS 04/16/2022 10:06 AM ED Clinical Pharmacist -  [phone]

## 2022-04-16 NOTE — Assessment & Plan Note (Addendum)
Hold eliquis, using heparin gtt instead due to NSTEMI See NSTEMI above, pt ruled out for PE at this point with negative D.Dimer.  Also on chronic eliquis with no missed doses.

## 2022-04-16 NOTE — Assessment & Plan Note (Addendum)
Does appear to have mild fluid overload today (peripheral edema mostly).  However 1) no pulm edema 2) BNP at baseline 3) no SOB at rest at the moment, laying flat in bed Therefore: 1. Holding torsemide for the moment and got 500cc bolus in ED due to hypotension and AKI 2. Holding entresto in setting of AKI + hyperkalemia.

## 2022-04-16 NOTE — Assessment & Plan Note (Signed)
See AKI above Got lokelma x1 dose Repeat BMP in AM Holding entresto

## 2022-04-16 NOTE — Progress Notes (Signed)
PROGRESS NOTE                                                                                                                                                                                                             Patient Demographics:    Randy Liu, is a 71 y.o. male, DOB - [birth year], HWE:[id number]  Outpatient Primary MD for the patient is Felton Clinton, MD    LOS - 0  Admit date - 04/15/2022    Chief Complaint  Patient presents with   Chest Pain   Cough       Brief Narrative (HPI from H&P)   71 y.o. male with medical history significant of DM2, DVT/PE on eliquis, HTN, HFrEF, ICM, multiple NSTEMIs over past several months.  Bed / wheelchair bound at baseline.  CKD 3.   Most recently pt with NSTEMI in March.  Due to being poor candidate for CABG decision made to do high risk PCI and stent X2 in March.  Pt presents to ED today with c/o L sided CP, L arm pain, Nausea and vomiting.  Currently symptom-free.  Also had outpatient course of oral antibiotics for presumed bronchitis.   Subjective:    Randy Liu today has, No headache, No chest pain, No abdominal pain - No Nausea, No new weakness tingling or numbness, +ve cough and SOB   Assessment  & Plan :    Chest pain with NSTEMI present on admission.  Had extensive work-up in March of this year and was deemed poor candidate for CABG, underwent PCI with 2 stents placed in March of this year.   Has been seen by his cardiologist Dr. Odis Hollingshead, currently on heparin drip along with Plavix for secondary prevention, blood pressure is improved hence will start low-dose beta-blocker, echo pending further management of heart issues will be deferred to his cardiologist.  CKD 3A with baseline creatinine around 1.6-1.7 with hyperkalemia - in the setting of hypotension and frank fluid overload, hold Entresto, gentle diuresis, low-dose beta-blocker and monitor.  Lokelma for  hyperkalemia, hypotension likely being caused by Entresto. Appears to have pre-renal azotemia / AKI likely in setting of mild hypotension.  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  Repeat echo pending.  He is currently in fluid overload with 3+ lower extremity edema, gentle Lasix, low-dose beta-blocker.  Due to AKI hyperkalemia and severe hypotension avoiding ACE/ARB or Entresto  Ongoing cough for several days.  Finished outpatient antibiotics for bronchitis, question if this is due to CHF and fluid overload, COVID test negative, check viral panel.  Also trend procalcitonin   Ambulatory dysfunction  - Pt is bed bound at baseline.  Abortive care.  PT OT and monitor.  Wound on his upper back present on admission.  Wound care.    Essential hypertension - BP running low, diuresis and low-dose beta-blocker if blood pressure tolerates.  Dyslipidemia Looks like he is on repatha, outpatient.  History of pulmonary embolism - on Eliquis currently on heparin drip.   Type 2 diabetes mellitus with hyperlipidemia [facility]) - Pharmacy to confirm to confirm his 7030 dose.  For now sliding scale.    Lab Results  Component Value Date   HGBA1C 6.7 (H) 11/21/2021   CBG (last 3)  Recent Labs    04/16/22 0152 04/16/22 0431 04/16/22 0822  GLUCAP 201* 143* 148*            Condition - Extremely Guarded  Family Communication  :  wife bedside 04/16/22  Code Status :  Full  Consults  :  Cards  PUD Prophylaxis : PPI   Procedures  :     TTE -       Disposition Plan  :    Status is: Observation  DVT Prophylaxis  :  Hep gtt     Lab Results  Component Value Date   PLT 301 04/15/2022    Diet :  Diet Order             Diet NPO time specified Except for: Sips with Meds  Diet effective now                    Inpatient Medications  Scheduled Meds:  clopidogrel  75 mg Oral Daily   furosemide  40 mg Intravenous Once   insulin aspart  0-9  Units Subcutaneous Q4H   Continuous Infusions:  heparin 1,200 Units/hr (04/16/22 0242)   PRN Meds:.acetaminophen, nitroGLYCERIN, ondansetron (ZOFRAN) IV  Antibiotics  :    Anti-infectives (From admission, onward)    None        Time Spent in minutes  30   Susa Raring M.D on 04/16/2022 at 10:53 AM  To page go to [email or web address]   [facility] -  Office  [phone]  See all Orders from today for further details    Objective:   Vitals:   04/16/22 0700 04/16/22 0702 04/16/22 0847 04/16/22 1032  BP: (!) 108/55  (!) 112/52 (!) 119/57  Pulse: 67  71 75  Resp: 14  18 20   Temp:  (!) 97.4 F (36.3 C) (!) 97.5 F (36.4 C)   TempSrc:  Oral Oral   SpO2: 99%  98% 97%  Weight:      Height:        Wt Readings from Last 3 Encounters:  04/15/22 123.8 kg  02/06/22 118.4 kg  01/22/22 118.4 kg     Intake/Output Summary (Last 24 hours) at 04/16/2022 1053 Last data filed at 04/16/2022 0710 Gross per 24 hour  Intake --  Output 925 ml  Net -925 ml     Physical Exam  Awake Alert, No new F.N deficits, Normal affect .AT,PERRAL Supple Neck, No JVD,   Symmetrical Chest wall movement, Good air movement bilaterally, ++ rales RRR,No Gallops,Rubs or new Murmurs,  +ve B.Sounds, Abd Soft, No tenderness,  3+ leg edema   Data Review:    CBC Recent Labs  Lab 04/15/22 1954  WBC 12.4*  HGB 11.9*  HCT 36.8*  PLT 301  MCV 93.2  MCH 30.1  MCHC 32.3  RDW 15.8*    Electrolytes Recent Labs  Lab 04/15/22 1954 04/15/22 2143 04/16/22 0252  NA 135  --  136  K 5.7*  --  5.2*  CL 102  --  103  CO2 25  --  26  GLUCOSE 251*  --  196*  BUN 71*  --  64*  CREATININE 1.78*  --  1.73*  CALCIUM 8.7*  --  8.7*  DDIMER  --  <0.27  --   BNP 147.9*  --   --     ------------------------------------------------------------------------------------------------------------------ No results for input(s): "CHOL", "HDL", "LDLCALC", "TRIG", "CHOLHDL", "LDLDIRECT" in the last 72  hours.  Lab Results  Component Value Date   HGBA1C 6.7 (H) 11/21/2021   Radiology Reports DG Abd Portable 1V  Result Date: 04/16/2022 CLINICAL DATA:  71 year old male with a history of ileus EXAM: PORTABLE ABDOMEN - 1 VIEW COMPARISON:  08/10/2021 FINDINGS: Paucity of gas within stomach and small bowel. No air-fluid levels. No evidence of distension. Small volume of colonic gas within right colon, transverse colon, left colon. Overlying EKG leads. No unexpected radiopaque foreign body or calcification. No significant stool burden. IMPRESSION: Nonspecific bowel gas pattern without gaseous distension. Electronically Signed   By: Gilmer Mor D.O.   On: 04/16/2022 08:30   DG Chest 2 View  Result Date: 04/15/2022 CLINICAL DATA:  Chest pain EXAM: CHEST - 2 VIEW COMPARISON:  Radiographs 11/24/2021 FINDINGS: Left basilar atelectasis similar to prior. No pleural effusion, or pneumothorax. Normal cardiomediastinal silhouette. No acute osseous abnormality. Aortic atherosclerotic calcification. IMPRESSION: Scar versus atelectasis in the left base, similar to prior. Electronically Signed   By: Minerva Fester M.D.   On: 04/15/2022 20:13

## 2022-04-17 DIAGNOSIS — I214 Non-ST elevation (NSTEMI) myocardial infarction: Secondary | ICD-10-CM | POA: Diagnosis not present

## 2022-04-17 LAB — ECHOCARDIOGRAM COMPLETE
Area-P 1/2: 2.71 cm2
Calc EF: 42.2 %
Height: 73 in
S' Lateral: 2.5 cm
Single Plane A2C EF: 47.3 %
Single Plane A4C EF: 45.5 %
Weight: 4368 [oz_av]

## 2022-04-17 LAB — LDL CHOLESTEROL, DIRECT: Direct LDL: 59 mg/dL (ref 0–99)

## 2022-04-17 LAB — CBC WITH DIFFERENTIAL/PLATELET
Abs Immature Granulocytes: 0.11 10*3/uL — ABNORMAL HIGH (ref 0.00–0.07)
Basophils Absolute: 0 10*3/uL (ref 0.0–0.1)
Basophils Relative: 0 %
Eosinophils Absolute: 0.2 10*3/uL (ref 0.0–0.5)
Eosinophils Relative: 2 %
HCT: 37.2 % — ABNORMAL LOW (ref 39.0–52.0)
Hemoglobin: 12.3 g/dL — ABNORMAL LOW (ref 13.0–17.0)
Immature Granulocytes: 1 %
Lymphocytes Relative: 36 %
Lymphs Abs: 2.9 10*3/uL (ref 0.7–4.0)
MCH: 30.1 pg (ref 26.0–34.0)
MCHC: 33.1 g/dL (ref 30.0–36.0)
MCV: 91.2 fL (ref 80.0–100.0)
Monocytes Absolute: 0.7 10*3/uL (ref 0.1–1.0)
Monocytes Relative: 9 %
Neutro Abs: 4.2 10*3/uL (ref 1.7–7.7)
Neutrophils Relative %: 52 %
Platelets: 280 10*3/uL (ref 150–400)
RBC: 4.08 MIL/uL — ABNORMAL LOW (ref 4.22–5.81)
RDW: 15.4 % (ref 11.5–15.5)
WBC: 8.2 10*3/uL (ref 4.0–10.5)
nRBC: 0 % (ref 0.0–0.2)

## 2022-04-17 LAB — BASIC METABOLIC PANEL
Anion gap: 12 (ref 5–15)
BUN: 53 mg/dL — ABNORMAL HIGH (ref 8–23)
CO2: 21 mmol/L — ABNORMAL LOW (ref 22–32)
Calcium: 9.1 mg/dL (ref 8.9–10.3)
Chloride: 102 mmol/L (ref 98–111)
Creatinine, Ser: 1.69 mg/dL — ABNORMAL HIGH (ref 0.61–1.24)
GFR, Estimated: 42 mL/min — ABNORMAL LOW (ref >60)
Glucose, Bld: 119 mg/dL — ABNORMAL HIGH (ref 70–99)
Potassium: 4.8 mmol/L (ref 3.5–5.1)
Sodium: 135 mmol/L (ref 135–145)

## 2022-04-17 LAB — GLUCOSE, CAPILLARY
Glucose-Capillary: 112 mg/dL — ABNORMAL HIGH (ref 70–99)
Glucose-Capillary: 114 mg/dL — ABNORMAL HIGH (ref 70–99)
Glucose-Capillary: 117 mg/dL — ABNORMAL HIGH (ref 70–99)
Glucose-Capillary: 134 mg/dL — ABNORMAL HIGH (ref 70–99)
Glucose-Capillary: 135 mg/dL — ABNORMAL HIGH (ref 70–99)
Glucose-Capillary: 156 mg/dL — ABNORMAL HIGH (ref 70–99)
Glucose-Capillary: 175 mg/dL — ABNORMAL HIGH (ref 70–99)
Glucose-Capillary: 199 mg/dL — ABNORMAL HIGH (ref 70–99)

## 2022-04-17 LAB — LIPID PANEL
Cholesterol: 106 mg/dL (ref 0–200)
HDL: 26 mg/dL — ABNORMAL LOW (ref >40)
LDL Cholesterol: 52 mg/dL (ref 0–99)
Total CHOL/HDL Ratio: 4.1 RATIO
Triglycerides: 140 mg/dL (ref <150)
VLDL: 28 mg/dL (ref 0–40)

## 2022-04-17 LAB — PROCALCITONIN: Procalcitonin: <0.1 ng/mL

## 2022-04-17 LAB — BRAIN NATRIURETIC PEPTIDE: B Natriuretic Peptide: 128.1 pg/mL — ABNORMAL HIGH (ref 0.0–100.0)

## 2022-04-17 LAB — APTT: aPTT: 103 seconds — ABNORMAL HIGH (ref 24–36)

## 2022-04-17 LAB — LIPOPROTEIN A (LPA): Lipoprotein (a): 24.1 nmol/L (ref <75.0)

## 2022-04-17 LAB — TROPONIN I (HIGH SENSITIVITY): Troponin I (High Sensitivity): 66 ng/L — ABNORMAL HIGH (ref <18)

## 2022-04-17 LAB — HEPARIN LEVEL (UNFRACTIONATED): Heparin Unfractionated: >1.1 IU/mL — ABNORMAL HIGH (ref 0.30–0.70)

## 2022-04-17 LAB — MAGNESIUM: Magnesium: 2 mg/dL (ref 1.7–2.4)

## 2022-04-17 MED ORDER — APIXABAN 5 MG PO TABS
5.0000 mg | ORAL_TABLET | Freq: Two times a day (BID) | ORAL | Status: DC
  Administered 2022-04-17 – 2022-04-19 (×5): 5 mg via ORAL
  Filled 2022-04-17 (×5): qty 1

## 2022-04-17 MED ORDER — FUROSEMIDE 10 MG/ML IJ SOLN
40.0000 mg | Freq: Two times a day (BID) | INTRAMUSCULAR | Status: AC
Start: 2022-04-17 — End: 2022-04-17
  Administered 2022-04-17 (×2): 40 mg via INTRAVENOUS
  Filled 2022-04-17 (×2): qty 4

## 2022-04-17 NOTE — Progress Notes (Signed)
PROGRESS NOTE                                                                                                                                                                                                             Patient Demographics:    Randy Liu, is a 71 y.o. male, DOB - 08-Jun-1951, [id number]  Outpatient Primary MD for the patient is [name], MD    LOS - 1  Admit date - 04/15/2022    Chief Complaint  Patient presents with   Chest Pain   Cough       Brief Narrative (HPI from H&P)   71 y.o. male with medical history significant of DM2, DVT/PE on eliquis, HTN, HFrEF, ICM, multiple NSTEMIs over past several months.  Bed / wheelchair bound at baseline.  CKD 3.   Most recently pt with NSTEMI in March.  Due to being poor candidate for CABG decision made to do high risk PCI and stent X2 in March.  Pt presents to ED today with c/o L sided CP, L arm pain, Nausea and vomiting.  Currently symptom-free.  Also had outpatient course of oral antibiotics for presumed bronchitis.   Subjective:    Randy Liu today has, No headache, No chest pain, No abdominal pain - No Nausea, No new weakness tingling or numbness, +ve cough and SOB   Assessment  & Plan :    Chest pain with NSTEMI present on admission.  Had extensive work-up in March of this year and was deemed poor candidate for CABG, underwent PCI with 2 stents placed in March of this year.   Has been seen by his cardiologist Dr. Harl Favor required heparin drip for 24 hours now on Plavix only, chest pain-free, continue low-dose beta-blocker, gets Repatha outpatient infusions along with fenofibrate which will be continued, troponin trend is stable, case discussed with cardiologist Dr. Aram Candela on 04/17/2022, echo shows improved EF and no new wall motion abnormalities.  Continue diuresis for CHF.  Continue to monitor still has significant fluid overload.  CKD 3A with  baseline creatinine around 1.6-1.7 with hyperkalemia - in the setting of hypotension and frank fluid overload, hold Entresto, gentle diuresis, low-dose beta-blocker and monitor.  Lokelma for hyperkalemia, hypotension likely being caused by Entresto. Appears to have pre-renal azotemia / AKI likely in setting of mild hypotension.  Acute on  chronic diastolic CHF EF now improved to 50%.  Repeat noted and discussed with cardiology.  He is currently in fluid overload with 2+ lower extremity edema, continue Lasix and beta-blocker, due to hyperkalemia and persistent hypotension on a chronic basis I do not think he will tolerate Entresto discussed with cardiology.  Ongoing cough for several days.  Finished outpatient antibiotics for bronchitis, this chronic cough could have been due to fluid overload and CHF, negative viral panel, negative COVID test, procalcitonin stable.  Afebrile.  Continue to diurese and monitor  BPPV.  Chronic history, has mild nystagmus with neck movement, meclizine.  If gets worse will try Valium.  Ambulatory dysfunction  - Pt is bed bound at baseline.  Abortive care.  PT OT and monitor.  Wound on his upper back present on admission.  Wound care.    Essential hypertension - BP runs low, currently on diuretics plus beta-blocker.  Monitor and adjust.  Dyslipidemia Looks like he is on repatha, outpatient.  History of pulmonary embolism - on Eliquis .   Type 2 diabetes mellitus with hyperlipidemia (HCC) - currently on sliding scale.   Lab Results  Component Value Date   HGBA1C 7.4 (H) 04/16/2022   CBG (last 3)  Recent Labs    04/17/22 0422 04/17/22 0504 04/17/22 0747  GLUCAP 112* 117* 114*         Condition - Extremely Guarded  Family Communication  :  wife bedside 04/16/22  Code Status :  Full  Consults  :  Cards  PUD Prophylaxis : PPI   Procedures  :     TTE -    1. Left ventricular ejection fraction, by estimation, is 50 to 55%. The left ventricle has  low normal function. The left ventricle demonstrates  regional wall motion abnormalities (see scoring diagram/findings for description). There is mild left ventricular hypertrophy. Left ventricular diastolic parameters are consistent with Grade I diastolic dysfunction (impaired relaxation). Elevated left atrial pressure.  2. Right ventricular systolic function is normal. The right ventricular size is normal.  3. The mitral valve is grossly normal. No evidence of mitral valve regurgitation. No evidence of mitral stenosis.  4. The aortic valve is grossly normal. Aortic valve regurgitation is not visualized. No aortic stenosis is present.       Disposition Plan  :    Status is: Observation  DVT Prophylaxis  :  Hep gtt     Lab Results  Component Value Date   PLT 280 04/17/2022    Diet :  Diet Order             DIET SOFT Room service appropriate? Yes; Fluid consistency: Thin  Diet effective now                    Inpatient Medications  Scheduled Meds:  clopidogrel  75 mg Oral Daily   fenofibrate  160 mg Oral Daily   furosemide  40 mg Intravenous Q12H   insulin aspart  0-9 Units Subcutaneous Q4H   latanoprost  1 drop Left Eye QHS   meclizine  25 mg Oral TID   metoprolol tartrate  12.5 mg Oral BID   pantoprazole  40 mg Oral Daily   Continuous Infusions:   PRN Meds:.acetaminophen, nitroGLYCERIN, ondansetron (ZOFRAN) IV, polyethylene glycol  Antibiotics  :    Anti-infectives (From admission, onward)    None        Time Spent in minutes  30   Susa Raring M.D on 04/17/2022 at  9:12 AM  To page go to [email or web address]   [facility] -  Office  [phone]  See all Orders from today for further details    Objective:   Vitals:   04/16/22 1936 04/17/22 0407 04/17/22 0408 04/17/22 0424  BP: (!) 114/57  103/64   Pulse:   68   Resp:   18   Temp:   98.1 F (36.7 C)   TempSrc:   Oral   SpO2:  95% 93%   Weight:    131 kg  Height:        Wt Readings  from Last 3 Encounters:  04/17/22 131 kg  02/06/22 118.4 kg  01/22/22 118.4 kg     Intake/Output Summary (Last 24 hours) at 04/17/2022 0912 Last data filed at 04/17/2022 0429 Gross per 24 hour  Intake 428.33 ml  Output 1250 ml  Net -821.67 ml     Physical Exam  Awake Alert, No new F.N deficits, Normal affect [place].AT,PERRAL Supple Neck, No JVD,   Symmetrical Chest wall movement, Good air movement bilaterally, CTAB RRR,No Gallops, Rubs or new Murmurs,  +ve B.Sounds, Abd Soft, No tenderness,   2+ leg edema   Data Review:    CBC Recent Labs  Lab 04/15/22 1954 04/17/22 0340  WBC 12.4* 8.2  HGB 11.9* 12.3*  HCT 36.8* 37.2*  PLT 301 280  MCV 93.2 91.2  MCH 30.1 30.1  MCHC 32.3 33.1  RDW 15.8* 15.4  LYMPHSABS  --  2.9  MONOABS  --  0.7  EOSABS  --  0.2  BASOSABS  --  0.0    Electrolytes Recent Labs  Lab 04/15/22 1954 04/15/22 2143 04/16/22 0252 04/16/22 1114 04/16/22 2032 04/17/22 0340 04/17/22 0536  NA 135  --  136  --   --  135  --   K 5.7*  --  5.2*  --   --  4.8  --   CL 102  --  103  --   --  102  --   CO2 25  --  26  --   --  21*  --   GLUCOSE 251*  --  196*  --   --  119*  --   BUN 71*  --  64*  --   --  53*  --   CREATININE 1.78*  --  1.73*  --   --  1.69*  --   CALCIUM 8.7*  --  8.7*  --   --  9.1  --   MG  --   --   --   --   --  2.0  --   DDIMER  --  <0.27  --   --   --   --   --   PROCALCITON  --   --   --  <0.10  --   --  <0.10  TSH  --   --   --   --  2.959  --   --   HGBA1C  --   --   --   --  7.4*  --   --   BNP 147.9*  --   --   --   --  128.1*  --     ------------------------------------------------------------------------------------------------------------------ Recent Labs    04/17/22 0340 04/17/22 0536  CHOL  --  106  HDL  --  26*  LDLCALC  --  52  TRIG  --  140  CHOLHDL  --  4.1  LDLDIRECT 59  --  Lab Results  Component Value Date   HGBA1C 7.4 (H) 04/16/2022   Radiology Reports ECHOCARDIOGRAM COMPLETE  Result  Date: 04/17/2022    ECHOCARDIOGRAM REPORT   Patient Name:   Randy Liu Date of Exam: 04/16/2022 Medical Rec #:  [id number]          Height:       73.0 in Accession #:    [id number]         Weight:       273.0 lb Date of Birth:  04-28-1951          BSA:          2.456 m Patient Age:    71 years           BP:           112/55 mmHg Patient Gender: M                  HR:           67 bpm. Exam Location:  Inpatient Procedure: 2D Echo, Cardiac Doppler, Color Doppler and Intracardiac            Opacification Agent Indications:     Chest Pain R07.9  History:         Patient has prior history of Echocardiogram examinations, most                  recent 11/25/2021. CHF and Cardiomyopathy, CAD; Risk                  Factors:Diabetes, Dyslipidemia, Sleep Apnea and Hypertension.  Sonographer:     Leta Jungling RDCS Referring Phys:  Tessa Lerner Diagnosing Phys: Tessa Lerner DO  Sonographer Comments: Patient is morbidly obese and Technically difficult study due to poor echo windows. IMPRESSIONS  1. Left ventricular ejection fraction, by estimation, is 50 to 55%. The left ventricle has low normal function. The left ventricle demonstrates regional wall motion abnormalities (see scoring diagram/findings for description). There is mild left ventricular hypertrophy. Left ventricular diastolic parameters are consistent with Grade I diastolic dysfunction (impaired relaxation). Elevated left atrial pressure.  2. Right ventricular systolic function is normal. The right ventricular size is normal.  3. The mitral valve is grossly normal. No evidence of mitral valve regurgitation. No evidence of mitral stenosis.  4. The aortic valve is grossly normal. Aortic valve regurgitation is not visualized. No aortic stenosis is present. Comparison(s): Compared to 11/25/2021 LVEF improved from 40-45% to 50-55%. FINDINGS  Left Ventricle: Left ventricular ejection fraction, by estimation, is 50 to 55%. The left ventricle has low normal function. The  left ventricle demonstrates regional wall motion abnormalities. Definity contrast agent was given IV to delineate the left ventricular endocardial borders. The left ventricular internal cavity size was normal in size. There is mild left ventricular hypertrophy. Left ventricular diastolic parameters are consistent with Grade I diastolic dysfunction (impaired relaxation). Elevated left atrial pressure.  LV Wall Scoring: The entire apex is hypokinetic. Right Ventricle: The right ventricular size is normal. No increase in right ventricular wall thickness. Right ventricular systolic function is normal. Left Atrium: Left atrial size was normal in size. Right Atrium: Right atrial size was normal in size. Pericardium: There is no evidence of pericardial effusion. Presence of epicardial fat layer. Mitral Valve: The mitral valve is grossly normal. No evidence of mitral valve regurgitation. No evidence of mitral valve stenosis. Tricuspid Valve: The tricuspid valve is grossly normal. Tricuspid valve regurgitation is trivial. No evidence of  tricuspid stenosis. Aortic Valve: The aortic valve is grossly normal. Aortic valve regurgitation is not visualized. No aortic stenosis is present. Pulmonic Valve: The pulmonic valve was grossly normal. Pulmonic valve regurgitation is not visualized. Aorta: The aortic root and ascending aorta are structurally normal, with no evidence of dilitation. IAS/Shunts: The interatrial septum was not well visualized.  LEFT VENTRICLE PLAX 2D LVIDd:         4.60 cm      Diastology LVIDs:         2.50 cm      LV e' medial:    3.57 cm/s LV PW:         1.10 cm      LV E/e' medial:  20.4 LV IVS:        1.10 cm      LV e' lateral:   4.05 cm/s LVOT diam:     1.90 cm      LV E/e' lateral: 18.0 LV SV:         61 LV SV Index:   25 LVOT Area:     2.84 cm  LV Volumes (MOD) LV vol d, MOD A2C: 101.0 ml LV vol d, MOD A4C: 102.0 ml LV vol s, MOD A2C: 53.2 ml LV vol s, MOD A4C: 55.6 ml LV SV MOD A2C:     47.8 ml LV SV  MOD A4C:     102.0 ml LV SV MOD BP:      45.0 ml RIGHT VENTRICLE RV S prime:     13.10 cm/s TAPSE (M-mode): 1.1 cm LEFT ATRIUM           Index        RIGHT ATRIUM          Index LA diam:      2.70 cm 1.10 cm/m   RA Area:     8.76 cm LA Vol (A2C): 26.7 ml 10.87 ml/m  RA Volume:   13.90 ml 5.66 ml/m LA Vol (A4C): 33.5 ml 13.64 ml/m  AORTIC VALVE LVOT Vmax:   99.30 cm/s LVOT Vmean:  66.400 cm/s LVOT VTI:    0.215 m  AORTA Ao Asc diam: 3.40 cm MITRAL VALVE MV Area (PHT): 2.71 cm     SHUNTS MV Decel Time: 280 msec     Systemic VTI:  0.22 m MV E velocity: 72.80 cm/s   Systemic Diam: 1.90 cm MV A velocity: 107.00 cm/s MV E/A ratio:  0.68 Sunit Tolia DO Electronically signed by Rex Kras DO Signature Date/Time: 04/17/2022/8:08:16 AM    Final    DG Abd Portable 1V  Result Date: 04/16/2022 CLINICAL DATA:  71 year old male with a history of ileus EXAM: PORTABLE ABDOMEN - 1 VIEW COMPARISON:  08/10/2021 FINDINGS: Paucity of gas within stomach and small bowel. No air-fluid levels. No evidence of distension. Small volume of colonic gas within right colon, transverse colon, left colon. Overlying EKG leads. No unexpected radiopaque foreign body or calcification. No significant stool burden. IMPRESSION: Nonspecific bowel gas pattern without gaseous distension. Electronically Signed   By: Corrie Mckusick D.O.   On: 04/16/2022 08:30   DG Chest 2 View  Result Date: 04/15/2022 CLINICAL DATA:  Chest pain EXAM: CHEST - 2 VIEW COMPARISON:  Radiographs 11/24/2021 FINDINGS: Left basilar atelectasis similar to prior. No pleural effusion, or pneumothorax. Normal cardiomediastinal silhouette. No acute osseous abnormality. Aortic atherosclerotic calcification. IMPRESSION: Scar versus atelectasis in the left base, similar to prior. Electronically Signed   By: Carroll Kinds.D.  On: 04/15/2022 20:13

## 2022-04-17 NOTE — TOC Initial Note (Signed)
Transition of Care [facility]) - Initial/Assessment Note    Patient Details  Name: Randy Liu MRN: [id number] Date of Birth: 01/13/1951  Transition of Care [facility]) CM/SW Contact:    Gala Lewandowsky, RN Phone Number: 04/17/2022, 3:04 PM  Clinical Narrative: Patient presented  for chest pain. PTA patient was from home with spouse (Caregiver) and plan will be to return home. Patient has DME Hospital Bed and [facility] Chair at home. Spouse has asked for an Building surveyor to be delivered to the home along with the [address] [employer] therapeutic Mattress. Case Manager will place an order for [employer]. Case manager called Zach with [employer] to follow up with the spouse. Patient was previously active with [facility] and wants to use them again. Case Manager did reach out to [employer] and is awaiting phone call. Patient will benefit from [facility] RN/PT/OT orders  for services.  Patient will need PTAR transport home once stable to transition.    Expected Discharge Plan: Home w Home Health Services Barriers to Discharge: Continued Medical Work up   Patient Goals and CMS Choice Patient states their goals for this hospitalization and ongoing recovery are:: to return home with home health      Expected Discharge Plan and Services Expected Discharge Plan: Home w Home Health Services In-house Referral: NA Discharge Planning Services: CM Consult Post Acute Care Choice: Home Health Living arrangements for the past 2 months: Single Family Home                 DME Arranged:  (hoyer lift wife asked for new mattress.) DME Agency: [employer] Date DME Agency Contacted: 04/17/22 Time DME Agency Contacted: 1500 Representative spoke with at DME Agency: Zack HH Arranged: RN, Disease Management, PT, OT HH Agency: Other - See comment Cindie Laroche) Date HH Agency Contacted: 04/17/22   Representative spoke with at [facility] Agency: Marylene Land  Prior Living Arrangements/Services Living arrangements for the  past 2 months: Single Family Home Lives with:: Spouse Patient language and need for interpreter reviewed:: Yes Do you feel safe going back to the place where you live?: Yes      Need for Family Participation in Patient Care: Yes (Comment) Care giver support system in place?: Yes (comment) Current home services: DME (hospital bed with mattress; cpap, and barton chair.) Criminal Activity/Legal Involvement Pertinent to Current Situation/Hospitalization: No - Comment as needed  Permission Sought/Granted Permission sought to share information with : Case Manager, Magazine features editor, Family Supports       Permission granted to share info w AGENCY: Archivist, [employer]        Emotional Assessment Appearance:: Appears stated age Attitude/Demeanor/Rapport: Engaged Affect (typically observed): Appropriate   Alcohol / Substance Use: Not Applicable Psych Involvement: No (comment)  Admission diagnosis:  Hyperkalemia [E87.5] NSTEMI (non-ST elevated myocardial infarction) (HCC) [I21.4] AKI (acute kidney injury) (HCC) [N17.9] Chest pain, unspecified type [R07.9] Patient Active Problem List   Diagnosis Date Noted   Hyperkalemia 04/16/2022   Constipation 11/26/2021   Ambulatory dysfunction 11/24/2021   Wound of back 11/23/2021   Dyslipidemia 11/21/2021   Essential hypertension 11/21/2021   Acute on chronic HFrEF (heart failure with reduced ejection fraction) (HCC)    Chronic heart failure with preserved ejection fraction (HCC)    Intractable nausea and vomiting    Atherosclerotic heart disease    Long-term insulin use (HCC)    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)    Class 2 severe obesity due  to excess calories with serious comorbidity and body mass index (BMI) of 35.0 to 35.9 in adult [facility])    Bedbound    Generalized abdominal pain    Stage 3a chronic kidney disease (CKD) (HCC) 08/31/2021   Atherosclerosis of native coronary artery of native  heart without angina pectoris    Coronary artery disease involving native coronary artery of native heart with unstable angina pectoris (HCC)    AKI (acute kidney injury) (HCC)    Pure hypercholesterolemia    NSTEMI (non-ST elevated myocardial infarction) (HCC) 08/10/2021   Elevated troponin    Ileus (HCC) 08/09/2021   Non-ST elevation (NSTEMI) myocardial infarction [facility])    Type 2 diabetes mellitus with hyperlipidemia (HCC)    Hypertriglyceridemia    Intractable abdominal pain    Nausea and vomiting    History of pulmonary embolism    Statin intolerance    Hematochezia 05/07/2021   Small bowel obstruction (HCC) 05/01/2021   SBO (small bowel obstruction) (HCC) 05/01/2021   Yeast infection 12/15/2020   Elevated LFTs 12/15/2020   Acute hypoxemic respiratory failure (HCC) 12/13/2020   Hyponatremia 12/13/2020   Asthma 12/13/2020   Diabetes (HCC) 12/13/2020   Acute cholangitis 12/12/2020   PCP:  Felton Clinton, MD Pharmacy:   [facility] - [place], [place] - [address] [place] [address] Phone: [phone] Fax: [phone]   Readmission Risk Interventions    11/25/2021    3:43 PM 09/02/2021    3:35 PM 08/13/2021    9:53 AM  Readmission Risk Prevention Plan  Transportation Screening Complete Complete Complete  PCP or Specialist Appt within 3-5 Days   Complete  HRI or Home Care Consult   Complete  Social Work Consult for Recovery Care Planning/Counseling   Complete  Palliative Care Screening   Not Applicable  Medication Review Oceanographer)  Complete Complete  HRI or Home Care Consult Complete Complete   SW Recovery Care/Counseling Consult Complete Complete   Palliative Care Screening  Not Applicable   Skilled Nursing Facility Complete Not Applicable

## 2022-04-17 NOTE — Progress Notes (Signed)
PT Cancellation Note  Patient Details Name: Randy Liu MRN: [id number] DOB: Dec 14, 1950   Cancelled Treatment:    Reason Eval/Treat Not Completed: Other (comment); spoke with pt and wife at the bedside following OT evaluation.  Discussed options for treatment assuming OT assessment correct pt has horizontal canal cupulolithiasis.  Discussed possible reasons for exacerbation as wife reports pt has a history of vertigo.  Patient not willing to attempt treatment at this time (which would require transfer via lift to mat table for rolling over procedure with 2-3 person assistance.  Discussed options for outpatient follow up as well.  Will attempt to see another day as pt too symptomatic with transfer up to room last night and after OT assessment.    Elray Mcgregor 04/17/2022, 2:18 PM Sheran Lawless, PT Acute Rehabilitation Services Office:[phone] 04/17/2022

## 2022-04-17 NOTE — Progress Notes (Addendum)
Progress Note  Patient Name: Kimothy Kishimoto Date of Encounter: 04/17/2022  Attending physician: Thurnell Lose, MD Primary care provider: [name], MD Primary Cardiologist: Rex Kras, DO, [facility]  Subjective: Callin Ashe is a 71 y.o. male who was seen and examined at bedside  Wife is present at bedside. Denies anginal discomfort or shortness of breath. Has intermittent nausea. Case discussed and reviewed with his nurse.  Objective: Vital Signs in the last 24 hours: Temp:  [97.6 F (36.4 C)-98.4 F (36.9 C)] 97.6 F (36.4 C) (08/04 1610) Pulse Rate:  [65-74] 65 (08/04 1145) Resp:  [16-20] 20 (08/04 1145) BP: (100-114)/(57-64) 100/57 (08/04 1145) SpO2:  [93 %-99 %] 95 % (08/04 1145) Weight:  [131 kg] 131 kg (08/04 0424)  Intake/Output:  Intake/Output Summary (Last 24 hours) at 04/17/2022 1641 Last data filed at 04/17/2022 1610 Gross per 24 hour  Intake 428.33 ml  Output 3250 ml  Net -2821.67 ml    Net IO Since Admission: -3,746.67 mL [04/17/22 1641]  Weights:  Filed Weights   04/15/22 1853 04/17/22 0424  Weight: 123.8 kg 131 kg    Telemetry: Personally reviewed.  Sinus with rare ventricular runs   Physical examination: PHYSICAL EXAM:    04/17/2022   11:45 AM 04/17/2022    4:24 AM 04/17/2022    4:08 AM  Vitals with BMI  Weight  288 lbs 13 oz   BMI  02.23   Systolic 361  224  Diastolic 57  64  Pulse 65  68    CONSTITUTIONAL: Appears older than stated age, chronically bedbound, hemodynamically stable, no acute distress.   SKIN: Skin is warm and dry. No rash noted. No cyanosis. No pallor. No jaundice.   posterior wound HEAD: Normocephalic and atraumatic.  EYES: No scleral icterus MOUTH/THROAT: Moist oral membranes.  NECK: Difficult to evaluate JVP as he is supine and adipose tissue, no thyromegaly noted. No carotid bruits  CHEST Normal respiratory effort. No intercostal retractions  LUNGS: Good air entry bilaterally, rise and fall in chest  cavity, rales noted bilaterally. CARDIOVASCULAR: Regular rate and rhythm, positive S9-P5, soft systolic murmur, no gallops or rubs. ABDOMINAL: Obese, soft, nontender, nondistended, positive bowel sounds in all 4 quadrants, no apparent ascites.  EXTREMITIES: 2+ bilateral pitting edema, warm to touch, una wraps present HEMATOLOGIC: No significant bruising NEUROLOGIC: Oriented to person, place, and time. Nonfocal. Normal muscle tone.  PSYCHIATRIC: Normal mood and affect. Normal behavior. Cooperative  Lab Results: Hematology Recent Labs  Lab 04/15/22 1954 04/17/22 0340  WBC 12.4* 8.2  RBC 3.95* 4.08*  HGB 11.9* 12.3*  HCT 36.8* 37.2*  MCV 93.2 91.2  MCH 30.1 30.1  MCHC 32.3 33.1  RDW 15.8* 15.4  PLT 301 280    Chemistry Recent Labs  Lab 04/15/22 1954 04/16/22 0252 04/17/22 0340  NA 135 136 135  K 5.7* 5.2* 4.8  CL 102 103 102  CO2 25 26 21*  GLUCOSE 251* 196* 119*  BUN 71* 64* 53*  CREATININE 1.78* 1.73* 1.69*  CALCIUM 8.7* 8.7* 9.1  GFRNONAA 40* 41* 42*  ANIONGAP 8 7 12      Cardiac Enzymes: Cardiac Panel (last 3 results) Recent Labs    04/16/22 0659 04/16/22 2215 04/17/22 0340  TROPONINIHS 151* 76* 66*    BNP (last 3 results) Recent Labs    11/23/21 1003 04/15/22 1954 04/17/22 0340  BNP 880.6* 147.9* 128.1*    ProBNP (last 3 results) No results for input(s): "PROBNP" in the last 8760 hours.   DDimer  Recent Labs  Lab 04/15/22 2143  DDIMER <0.27     Hemoglobin A1c:  Lab Results  Component Value Date   HGBA1C 7.4 (H) 04/16/2022   MPG 165.68 04/16/2022    TSH  Recent Labs    04/16/22 2032  TSH 2.959    Lipid Panel     Component Value Date/Time   CHOL 106 04/17/2022 0536   TRIG 140 04/17/2022 0536   HDL 26 (L) 04/17/2022 0536   CHOLHDL 4.1 04/17/2022 0536   VLDL 28 04/17/2022 0536   LDLCALC 52 04/17/2022 0536   LDLDIRECT 59 04/17/2022 0340    Imaging: ECHOCARDIOGRAM COMPLETE  Result Date: 04/17/2022    ECHOCARDIOGRAM REPORT    Patient Name:   Tymier Lindholm Date of Exam: 04/16/2022 Medical Rec #:  [id number]          Height:       73.0 in Accession #:    [id number]         Weight:       273.0 lb Date of Birth:  Jan 04, 1951          BSA:          2.456 m Patient Age:    24 years           BP:           112/55 mmHg Patient Gender: M                  HR:           67 bpm. Exam Location:  Inpatient Procedure: 2D Echo, Cardiac Doppler, Color Doppler and Intracardiac            Opacification Agent Indications:     Chest Pain R07.9  History:         Patient has prior history of Echocardiogram examinations, most                  recent 11/25/2021. CHF and Cardiomyopathy, CAD; Risk                  Factors:Diabetes, Dyslipidemia, Sleep Apnea and Hypertension.  Sonographer:     Darlina Sicilian RDCS Referring Phys:  Rex Kras Diagnosing Phys: Rex Kras DO  Sonographer Comments: Patient is morbidly obese and Technically difficult study due to poor echo windows. IMPRESSIONS  1. Left ventricular ejection fraction, by estimation, is 50 to 55%. The left ventricle has low normal function. The left ventricle demonstrates regional wall motion abnormalities (see scoring diagram/findings for description). There is mild left ventricular hypertrophy. Left ventricular diastolic parameters are consistent with Grade I diastolic dysfunction (impaired relaxation). Elevated left atrial pressure.  2. Right ventricular systolic function is normal. The right ventricular size is normal.  3. The mitral valve is grossly normal. No evidence of mitral valve regurgitation. No evidence of mitral stenosis.  4. The aortic valve is grossly normal. Aortic valve regurgitation is not visualized. No aortic stenosis is present. Comparison(s): Compared to 11/25/2021 LVEF improved from 40-45% to 50-55%. FINDINGS  Left Ventricle: Left ventricular ejection fraction, by estimation, is 50 to 55%. The left ventricle has low normal function. The left ventricle demonstrates regional wall  motion abnormalities. Definity contrast agent was given IV to delineate the left ventricular endocardial borders. The left ventricular internal cavity size was normal in size. There is mild left ventricular hypertrophy. Left ventricular diastolic parameters are consistent with Grade I diastolic dysfunction (impaired relaxation). Elevated left atrial pressure.  LV Wall Scoring: The entire  apex is hypokinetic. Right Ventricle: The right ventricular size is normal. No increase in right ventricular wall thickness. Right ventricular systolic function is normal. Left Atrium: Left atrial size was normal in size. Right Atrium: Right atrial size was normal in size. Pericardium: There is no evidence of pericardial effusion. Presence of epicardial fat layer. Mitral Valve: The mitral valve is grossly normal. No evidence of mitral valve regurgitation. No evidence of mitral valve stenosis. Tricuspid Valve: The tricuspid valve is grossly normal. Tricuspid valve regurgitation is trivial. No evidence of tricuspid stenosis. Aortic Valve: The aortic valve is grossly normal. Aortic valve regurgitation is not visualized. No aortic stenosis is present. Pulmonic Valve: The pulmonic valve was grossly normal. Pulmonic valve regurgitation is not visualized. Aorta: The aortic root and ascending aorta are structurally normal, with no evidence of dilitation. IAS/Shunts: The interatrial septum was not well visualized.  LEFT VENTRICLE PLAX 2D LVIDd:         4.60 cm      Diastology LVIDs:         2.50 cm      LV e' medial:    3.57 cm/s LV PW:         1.10 cm      LV E/e' medial:  20.4 LV IVS:        1.10 cm      LV e' lateral:   4.05 cm/s LVOT diam:     1.90 cm      LV E/e' lateral: 18.0 LV SV:         61 LV SV Index:   25 LVOT Area:     2.84 cm  LV Volumes (MOD) LV vol d, MOD A2C: 101.0 ml LV vol d, MOD A4C: 102.0 ml LV vol s, MOD A2C: 53.2 ml LV vol s, MOD A4C: 55.6 ml LV SV MOD A2C:     47.8 ml LV SV MOD A4C:     102.0 ml LV SV MOD BP:       45.0 ml RIGHT VENTRICLE RV S prime:     13.10 cm/s TAPSE (M-mode): 1.1 cm LEFT ATRIUM           Index        RIGHT ATRIUM          Index LA diam:      2.70 cm 1.10 cm/m   RA Area:     8.76 cm LA Vol (A2C): 26.7 ml 10.87 ml/m  RA Volume:   13.90 ml 5.66 ml/m LA Vol (A4C): 33.5 ml 13.64 ml/m  AORTIC VALVE LVOT Vmax:   99.30 cm/s LVOT Vmean:  66.400 cm/s LVOT VTI:    0.215 m  AORTA Ao Asc diam: 3.40 cm MITRAL VALVE MV Area (PHT): 2.71 cm     SHUNTS MV Decel Time: 280 msec     Systemic VTI:  0.22 m MV E velocity: 72.80 cm/s   Systemic Diam: 1.90 cm MV A velocity: 107.00 cm/s MV E/A ratio:  0.68 Tyon Cerasoli DO Electronically signed by Rex Kras DO Signature Date/Time: 04/17/2022/8:08:16 AM    Final    DG Abd Portable 1V  Result Date: 04/16/2022 CLINICAL DATA:  71 year old male with a history of ileus EXAM: PORTABLE ABDOMEN - 1 VIEW COMPARISON:  08/10/2021 FINDINGS: Paucity of gas within stomach and small bowel. No air-fluid levels. No evidence of distension. Small volume of colonic gas within right colon, transverse colon, left colon. Overlying EKG leads. No unexpected radiopaque foreign body or calcification. No significant stool  burden. IMPRESSION: Nonspecific bowel gas pattern without gaseous distension. Electronically Signed   By: Corrie Mckusick D.O.   On: 04/16/2022 08:30   DG Chest 2 View  Result Date: 04/15/2022 CLINICAL DATA:  Chest pain EXAM: CHEST - 2 VIEW COMPARISON:  Radiographs 11/24/2021 FINDINGS: Left basilar atelectasis similar to prior. No pleural effusion, or pneumothorax. Normal cardiomediastinal silhouette. No acute osseous abnormality. Aortic atherosclerotic calcification. IMPRESSION: Scar versus atelectasis in the left base, similar to prior. Electronically Signed   By: Placido Sou M.D.   On: 04/15/2022 20:13    CARDIAC DATABASE: EKG: 04/16/2022: Sinus rhythm, 96 bpm, poor wave progression, consider old lateral infarct.   04/16/2022: Sinus rhythm, 69 bpm, poor R wave progression,  left axis deviation, left anterior fascicular block, consider old lateral infarct.   Echocardiogram: 08/10/2021: LVEF 55 to 60%, trivial MR, estimated RAP 8 mmHg. 11/21/2021: LVEF 20-25%, severely reduced, global hypokinesis, RV systolic function mildly reduced, chronic pericardial fat pad.  11/25/2021: LVEF 40-45%.  04/16/2022: LVEF 50-55%, low normal ejection fraction, regional wall motion abnormalities (The entire apex is hypokinetic.), mild LVH, grade 1 diastolic impairment, no significant valvular heart disease.   Heart Catheterization: Coronary intervention 11/21/2021: LM: Normal LAD: Prox 100% occlusion         Diag 1 70% stenosis, followed by 100% occlusion Lcx: 99% stenosis in prox OM1 with minimal flow distally, which has progressed from previous angiogram in 07/2021        OM2 occluded RCA: Prox 50% stenosis (Present but under appreciated on previous angiogram in 07/2021)         Mid 75% stenosis (Present but under appreciated on previous angiogram in 07/2021)         This lesion either was more severe and occlusive with wires and balloons or developed spasm or dissection to render it flow limiting during the procedure, thus requiring stenting         Distal RCA into RPDA with 95% stenosis         Ostial-prox RPLA 90% stenosis   Successful IVUS guided percutaneous coronary intervention      PTCA RPLA ostium with 2.0 X 8 mm balloon     PTA and stent placement 2.5 X 18 mm Onyx Frontier DES distal RCA/RPDA             Post dilatation with 2.5 X 8 mm [place]man balloon at 18 atm     PTCA and stent placement 3.0 X 18 mm Onyx Frontier DES mid RCA             Post dilatation with 3.5 X 12 mm [place] balloon at 22 atm   Intraprocedural hypotension and shock requiring IV norepinephrine up to 30 mics, now completely weaned off Intraprocedural STE still persist, likely reflect intraprocedural no flow to RCA and to LAD via collaterals, likely causing myocardial stunning. Chest pain down from 7/10 to  1-2/10   He will need close monitoring of his chest pain, EKG, hemodynamics, urine output   Complex procedure due to intra procedural chest pain and ST elevation, diagnostic challenges responsible for slow flow, intra procedural hypotension requiring norepinephrine  Scheduled Meds:  apixaban  5 mg Oral BID   clopidogrel  75 mg Oral Daily   fenofibrate  160 mg Oral Daily   furosemide  40 mg Intravenous BID   insulin aspart  0-9 Units Subcutaneous Q4H   latanoprost  1 drop Left Eye QHS   meclizine  25 mg Oral TID   metoprolol  tartrate  12.5 mg Oral BID   pantoprazole  40 mg Oral Daily    Continuous Infusions:   PRN Meds: acetaminophen, nitroGLYCERIN, ondansetron (ZOFRAN) IV, polyethylene glycol   IMPRESSION & RECOMMENDATIONS: Samie Reasons is a 71 y.o. Caucasian male whose past medical history and cardiac risk factors include:  chronically bedbound, type 2 diabetes, gastroparesis, hyperlipidemia, statin intolerance, CKD stage IIIa, history of DVT on Eliquis, severe peripheral neuropathy, nonhealing GI ulcers status post duodenal polyp clipping, CAD status post PCI, recovered ischemic cardiomyopathy, chronic HFpEF, sleep apnea on CPAP.  Impression: Precordial pain-resolved.  Elevated troponin is likely secondary to supply demand ischemia in the setting of multivessel CAD, history of cardiomyopathy which is now recovered, HFpEF, recent viral infection, and acute renal insufficiency contributing to reduced clearance  Acute HFpEF  Established coronary artery disease without angina pectoris  Benign essential hypertension  Hypercholesterolemia with history of statin intolerance  Sleep apnea on CPAP  Plan: Precordial pain/elevated troponins: His symptoms on arrival were not consistent with angina pectoris.  Given his history of ischemic cardiomyopathy and multivessel CAD he was started on IV heparin until the troponins were trended and repeat echocardiogram was  performed. Repeat echocardiogram illustrates improvement in LVEF to 50-55% with hypokinetic apex and no significant valvular heart disease.  The echocardiographic findings have improved significantly when compared to March 2023.  Patient does not have anginal discomfort since arrival to the ED and based on the results of the echocardiogram we will discontinue IV heparin drip.  Acute heart failure with preserved EF, stage C, NYHA class II/III: Appears to be significantly volume overload on physical examination likely due to HFpEF exacerbation, dependent edema, reduced oncotic pressures. Bilateral lower lobe compression is present. Currently on parenteral diuretics. Net IO Since Admission: -3,746.67 mL [04/17/22 1653] At home patient tends to have soft blood pressures -Home medications include Entresto 24/26 mg p.o. twice daily, Toprol-XL 12.5 mg p.o. daily, torsemide 5 mg p.o. daily. Continue telemetry. Reemphasized importance of low-salt diet.  Established coronary artery disease without angina pectoris: Denies angina pectoris. LVEF has improved when compared to prior studies. LDL currently at goal less than 55 mg/dL. Triglyceride levels have improved significantly from 313 mg/dL to 140 mg/dL Continue Repatha and Vascepa. Currently on low-dose beta-blocker therapy. No plans for additional cardiovascular testing at this time.  Hypercholesterolemia with history of statin intolerance: Currently on Repatha. Lipid profile reviewed. No medication changes warranted at this time.  Sleep apnea on CPAP: Reemphasized the importance of compliance.  Patient's questions and concerns were addressed to his satisfaction. He voices understanding of the instructions provided during this encounter.   This note was created using a voice recognition software as a result there may be grammatical errors inadvertently enclosed that do not reflect the nature of this encounter. Every attempt is made to correct  such errors.  Total time spent: 35 minutes  Mechele Claude [facility]  Pager: [phone] Office: [phone] 04/17/2022, 4:41 PM

## 2022-04-17 NOTE — Progress Notes (Signed)
ANTICOAGULATION CONSULT NOTE - Initial Consult  Pharmacy Consult for Heparin Indication:  chest pain/ACS, history of VTE  Allergies  Allergen Reactions   Nsaids Other (See Comments)    Renal Insufficiency Kidney issues    Reglan [Metoclopramide] Itching    Loss of Balance Urinary Incontinence Can tolerate low dose at bedtime   Augmentin [Amoxicillin-Pot Clavulanate] Diarrhea and Nausea And Vomiting    Abdomen pain    Statins Other (See Comments)    Myalgias Myositis     Chocolate Itching    Can tolerate if taking Benadryl   Other Other (See Comments)    All nuts - told to avoid due to GI tract issues   Spiriva Respimat [Tiotropium Bromide Monohydrate] Other (See Comments)    Urinary retention   Strawberry (Diagnostic) Itching    Can tolerate if taking Benadryl   Jardiance [Empagliflozin] Other (See Comments)    Fainting Weakness  Lightheaded  Falling    Zyloprim [Allopurinol] Rash    Patient Measurements: Height: 6\' 1"  (185.4 cm) Weight: 131 kg (288 lb 12.8 oz) IBW/kg (Calculated) : 79.9 Heparin Dosing Weight: 107.1 kg  Vital Signs: Temp: 98.1 F (36.7 C) (08/04 0408) Temp Source: Oral (08/04 0408) BP: 103/64 (08/04 0408) Pulse Rate: 68 (08/04 0408)  Labs: Recent Labs    04/15/22 1954 04/15/22 2143 04/16/22 0252 04/16/22 0659 04/16/22 1100 04/16/22 1114 04/16/22 2032 04/16/22 2215 04/17/22 0340  HGB 11.9*  --   --   --   --   --   --   --  12.3*  HCT 36.8*  --   --   --   --   --   --   --  37.2*  PLT 301  --   --   --   --   --   --   --  280  APTT  --   --   --   --   --  60* 69*  --  103*  HEPARINUNFRC  --   --   --   --  >1.10*  --   --   --  >1.10*  CREATININE 1.78*  --  1.73*  --   --   --   --   --  1.69*  TROPONINIHS 42*   < > 160* 151*  --   --   --  76* 66*   < > = values in this interval not displayed.     Estimated Creatinine Clearance: 55.2 mL/min (A) (by C-G formula based on SCr of 1.69 mg/dL (H)).   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 Patient is presenting with chest pain and cough. Last dose of apixaban 8/2 @ 1100. Heparin per pharmacy consult placed for  chest pain/ACS, history of VTE .   aPTT resulted slightly supratherapeutic at 103 after very slight rate increase to 1450 units/hr overnight. Level drawn appropriately from opposite arm per RN. Heparin level elevated, as expected due  to influence of DOAC. CBC stable. No issues with infusion or bleeding per RN.  Goal of Therapy:  Heparin level 0.3-0.7 units/ml aPTT 66-102 seconds Monitor platelets by anticoagulation protocol: Yes   Plan:  Decrease heparin infusion slightly to 1400 units/hr Check 8hr heparin level Monitor daily aPTT/heparin level until correlating, CBC Monitor for signs/symptoms of bleeding Resume apixaban as appropriate   Leia Alf, PharmD, BCPS Please check AMION for all [facility] contact numbers Clinical Pharmacist 04/17/2022 5:48 AM

## 2022-04-17 NOTE — Evaluation (Signed)
Occupational Therapy Evaluation Patient Details Name: Randy Liu MRN: [id number] DOB: [birth year] Today's Date: 04/17/2022   History of Present Illness 71 y.o. male presents to ED today with c/o L sided CP, L arm pain, Nausea and vomiting.  Acute on chronic HFrEF with medical history significant of DM2, DVT/PE on eliquis, HTN, HFrEF, ICM, multiple NSTEMIs over past several months.  Bed / wheelchair bound at baseline.   Clinical Impression   Pt currently at baseline functionally with spouse assisting at bed level with all ADL tasks at home.  Biggest limitation currently that has worsened is his dizziness.  With brief testing pt exhibited positive signs of BPPV horizontal canal cupulothiasis but recommend further testing as pt became nauseas and dizziness with the roll test and was unable to complete any further testing.  Because of functional limitations and decreased ability to tolerate/complete sitting up or rolling, may be difficult to further evaluate or provide treatment which usually involves positional maneuvers to correct.  Will defer vestibular treatment options to PT.  No further OT needs at this time.        Recommendations for follow up therapy are one component of a multi-disciplinary discharge planning process, led by the attending physician.  Recommendations may be updated based on patient status, additional functional criteria and insurance authorization.   Follow Up Recommendations  No OT follow up    Assistance Recommended at Discharge Frequent or constant Supervision/Assistance  Patient can return home with the following A lot of help with bathing/dressing/bathroom;Direct supervision/assist for medications management;Assistance with cooking/housework    Functional Status Assessment  Patient has not had a recent decline in their functional status  Equipment Recommendations  None recommended by OT       Precautions / Restrictions Precautions Precautions:  Fall Precaution Comments: Bed bound for the past year Restrictions Weight Bearing Restrictions: No      Mobility Bed Mobility Overal bed mobility: Needs Assistance Bed Mobility: Rolling Rolling: Total assist              Transfers                          Balance Overall balance assessment:  (pt bed bound and with increased dizziness with positional changes.  Did not attempt EOB after one episode of rolling.)                                         ADL either performed or assessed with clinical judgement   ADL Overall ADL's : At baseline                                       General ADL Comments: Pt's spouse provides total assist for all selfcare tasks bed level.  She has him roll to the right side to assist with peri care and for toileting tasks as this is easier with use of the hospital bed.  He also has a trapeze on his bed that he can use to help with lifting up as well.  Pt with increased dizziness reported with positional changes.  Attempted rolling to the left side with total assist and pt reporting increased dizziness.  Noted horizontal nystagmus with this movement and pt reporting increased dizziness.  Transitioned back to midline and waited for  symptoms to resolve.  Therapist then completed horizontal canal testing with pt demonstrating horizontal apogeotropic nystagmus with head turn in both directions with the left side being more intense.  Nystagmus continued for greater than 30 seconds with pt turning back to midline secondary to being unable to tolerate symptoms as well as nausea and vomiting.  Unable to attempt further canal testing secondary to nausea as well as pt demonstrating severe limitations in flexibility and tolerance for anterior posterior testing.  Discussed with pt and spouse current findings and possibility of pt demonstrating BPPV of horizontal canal cupulothiasis.  Would need to test other canals to rule out  further.  Head shaking nystagmus was negative as well as VOR testing and head thrust test.   Unfortunately, pt would not tolerate or be able to achieve positions needed to treat this condition.  Will benefit from further vestibular eval.  No further OT needs at this time as pt is baseline functioning at bed level with spouses assist.     Vision Baseline Vision/History: 0 No visual deficits Ability to See in Adequate Light: 0 Adequate Patient Visual Report: No change from baseline Vision Assessment?: No apparent visual deficits     Perception Perception Perception: Within Functional Limits   Praxis Praxis Praxis: Intact    Pertinent Vitals/Pain Pain Assessment Pain Assessment: Faces Pain Score: 0-No pain     Hand Dominance (P) Right   Extremity/Trunk Assessment Upper Extremity Assessment Upper Extremity Assessment: Overall WFL for tasks assessed   Lower Extremity Assessment Lower Extremity Assessment: Defer to PT evaluation   Cervical / Trunk Assessment Cervical / Trunk Assessment:  (bed bound currently, limitations with cervical rotation and extension likely)   Communication     Cognition Arousal/Alertness: Awake/alert Behavior During Therapy: WFL for tasks assessed/performed Overall Cognitive Status: Within Functional Limits for tasks assessed                                                  Home Living Family/patient expects to be discharged to:: (P) Private residence Living Arrangements: (P) Spouse/significant other Available Help at Discharge: (P) Family;Available 24 hours/day Type of Home: (P) House Home Access: (P) Ramped entrance     Home Layout: (P) Two level;Able to live on main level with bedroom/bathroom     Bathroom Shower/Tub: (P) Walk-in shower   Bathroom Toilet: (P) Handicapped height Bathroom Accessibility: (P) Yes   Home Equipment: (P) Hospital bed;Wheelchair - manual;Hand held Programmer, systems (2  wheels);BSC/3in1;Other (comment)   Additional Comments: (P) Barton chair, trapeze bar, PTAR for transport      Prior Functioning/Environment Prior Level of Function : (P) Needs assist       Physical Assist : (P) Mobility (physical)     Mobility Comments: (P) mod assist for rolling and total assist for transfers from bed to barton chair. no longer using lift or WC ADLs Comments: (P) toilets, bathes and eats in bed. Bed pan and urinal with wife assist for all transfers and pericare. Pt self feeds                           AM-PAC OT "6 Clicks" Daily Activity     Outcome Measure Help from another person eating meals?: A Little Help from another person taking care of personal grooming?: A Little Help from another  person toileting, which includes using toliet, bedpan, or urinal?: Total Help from another person bathing (including washing, rinsing, drying)?: Total Help from another person to put on and taking off regular upper body clothing?: A Lot Help from another person to put on and taking off regular lower body clothing?: Total 6 Click Score: 11   End of Session Nurse Communication: Other (comment) (dizziness)  Activity Tolerance: Other (comment) (Limited secondary to dizziness and nausea) Patient left: in bed;with call bell/phone within reach;with family/visitor present                   Time: 0800-0900 OT Time Calculation (min): 60 min Charges:  OT Evaluation $OT Eval Moderate Complexity: 1 Mod OT Treatments $Self Care/Home Management : 38-52 mins Jalyn Dutta OTR/L 04/17/2022, 9:32 AM

## 2022-04-17 NOTE — Care Management (Signed)
    Durable Medical Equipment  (From admission, onward)           Start     Ordered   04/17/22 1514  For home use only DME Other see comment  Once       Comments: Electric Hoyer lift for transfers.  Question:  Length of Need  Answer:  Lifetime   04/17/22 1514

## 2022-04-17 NOTE — Progress Notes (Signed)
ANTICOAGULATION CONSULT NOTE   Pharmacy Consult for Heparin>>apixaban Indication:  chest pain/ACS, history of VTE  Allergies  Allergen Reactions   Nsaids Other (See Comments)    Renal Insufficiency Kidney issues    Reglan [Metoclopramide] Itching    Loss of Balance Urinary Incontinence Can tolerate low dose at bedtime   Augmentin [Amoxicillin-Pot Clavulanate] Diarrhea and Nausea And Vomiting    Abdomen pain    Statins Other (See Comments)    Myalgias Myositis     Chocolate Itching    Can tolerate if taking Benadryl   Other Other (See Comments)    All nuts - told to avoid due to GI tract issues   Spiriva Respimat [Tiotropium Bromide Monohydrate] Other (See Comments)    Urinary retention   Strawberry (Diagnostic) Itching    Can tolerate if taking Benadryl   Jardiance [Empagliflozin] Other (See Comments)    Fainting Weakness  Lightheaded  Falling    Zyloprim [Allopurinol] Rash    Patient Measurements: Height: 6\' 1"  (185.4 cm) Weight: 131 kg (288 lb 12.8 oz) IBW/kg (Calculated) : 79.9 Heparin Dosing Weight: 107.1 kg  Vital Signs: Temp: 98.1 F (36.7 C) (08/04 0408) Temp Source: Oral (08/04 0408) BP: 103/64 (08/04 0408) Pulse Rate: 68 (08/04 0408)  Labs: Recent Labs    04/15/22 1954 04/15/22 2143 04/16/22 0252 04/16/22 0659 04/16/22 1100 04/16/22 1114 04/16/22 2032 04/16/22 2215 04/17/22 0340  HGB 11.9*  --   --   --   --   --   --   --  12.3*  HCT 36.8*  --   --   --   --   --   --   --  37.2*  PLT 301  --   --   --   --   --   --   --  280  APTT  --   --   --   --   --  60* 69*  --  103*  HEPARINUNFRC  --   --   --   --  >1.10*  --   --   --  >1.10*  CREATININE 1.78*  --  1.73*  --   --   --   --   --  1.69*  TROPONINIHS 42*   < > 160* 151*  --   --   --  76* 66*   < > = values in this interval not displayed.     Estimated Creatinine Clearance: 55.2 mL/min (A) (by C-G formula based on SCr of 1.69 mg/dL (H)).   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 Patient is presenting with chest pain and cough. Last dose of apixaban 8/2 @ 1100. Heparin per pharmacy consult placed for  chest pain/ACS, history of VTE .   No ischemic evaluation planned. Will transition patient back to apixaban. CBC stable overnight.   Goal of Therapy:  Heparin level 0.3-0.7 units/ml aPTT 66-102 seconds Monitor platelets by anticoagulation protocol:  Yes   Plan:  Stop heparin this morning Resume apixaban 5mg  bid  PharmD., BCPS Clinical Pharmacist 04/17/2022 9:23 AM

## 2022-04-18 DIAGNOSIS — I214 Non-ST elevation (NSTEMI) myocardial infarction: Secondary | ICD-10-CM | POA: Diagnosis not present

## 2022-04-18 LAB — BASIC METABOLIC PANEL
Anion gap: 11 (ref 5–15)
BUN: 49 mg/dL — ABNORMAL HIGH (ref 8–23)
CO2: 29 mmol/L (ref 22–32)
Calcium: 9.5 mg/dL (ref 8.9–10.3)
Chloride: 93 mmol/L — ABNORMAL LOW (ref 98–111)
Creatinine, Ser: 1.91 mg/dL — ABNORMAL HIGH (ref 0.61–1.24)
GFR, Estimated: 37 mL/min — ABNORMAL LOW (ref >60)
Glucose, Bld: 172 mg/dL — ABNORMAL HIGH (ref 70–99)
Potassium: 4.3 mmol/L (ref 3.5–5.1)
Sodium: 133 mmol/L — ABNORMAL LOW (ref 135–145)

## 2022-04-18 LAB — CBC WITH DIFFERENTIAL/PLATELET
Abs Immature Granulocytes: 0.05 10*3/uL (ref 0.00–0.07)
Basophils Absolute: 0 10*3/uL (ref 0.0–0.1)
Basophils Relative: 0 %
Eosinophils Absolute: 0.1 10*3/uL (ref 0.0–0.5)
Eosinophils Relative: 2 %
HCT: 35.4 % — ABNORMAL LOW (ref 39.0–52.0)
Hemoglobin: 11.9 g/dL — ABNORMAL LOW (ref 13.0–17.0)
Immature Granulocytes: 1 %
Lymphocytes Relative: 33 %
Lymphs Abs: 2.6 10*3/uL (ref 0.7–4.0)
MCH: 30.3 pg (ref 26.0–34.0)
MCHC: 33.6 g/dL (ref 30.0–36.0)
MCV: 90.1 fL (ref 80.0–100.0)
Monocytes Absolute: 0.7 10*3/uL (ref 0.1–1.0)
Monocytes Relative: 9 %
Neutro Abs: 4.4 10*3/uL (ref 1.7–7.7)
Neutrophils Relative %: 55 %
Platelets: 261 10*3/uL (ref 150–400)
RBC: 3.93 MIL/uL — ABNORMAL LOW (ref 4.22–5.81)
RDW: 15.2 % (ref 11.5–15.5)
WBC: 7.9 10*3/uL (ref 4.0–10.5)
nRBC: 0 % (ref 0.0–0.2)

## 2022-04-18 LAB — GLUCOSE, CAPILLARY
Glucose-Capillary: 194 mg/dL — ABNORMAL HIGH (ref 70–99)
Glucose-Capillary: 162 mg/dL — ABNORMAL HIGH (ref 70–99)
Glucose-Capillary: 129 mg/dL — ABNORMAL HIGH (ref 70–99)
Glucose-Capillary: 226 mg/dL — ABNORMAL HIGH (ref 70–99)
Glucose-Capillary: 222 mg/dL — ABNORMAL HIGH (ref 70–99)
Glucose-Capillary: 243 mg/dL — ABNORMAL HIGH (ref 70–99)
Glucose-Capillary: 244 mg/dL — ABNORMAL HIGH (ref 70–99)

## 2022-04-18 LAB — MAGNESIUM: Magnesium: 2 mg/dL (ref 1.7–2.4)

## 2022-04-18 LAB — PROCALCITONIN: Procalcitonin: <0.1 ng/mL

## 2022-04-18 LAB — BRAIN NATRIURETIC PEPTIDE: B Natriuretic Peptide: 155 pg/mL — ABNORMAL HIGH (ref 0.0–100.0)

## 2022-04-18 MED ORDER — INSULIN ASPART 100 UNIT/ML IJ SOLN
0.0000 [IU] | Freq: Three times a day (TID) | INTRAMUSCULAR | Status: DC
  Administered 2022-04-19: 8 [IU] via SUBCUTANEOUS

## 2022-04-18 MED ORDER — INSULIN ASPART 100 UNIT/ML IJ SOLN
0.0000 [IU] | Freq: Every day | INTRAMUSCULAR | Status: DC
  Administered 2022-04-18: 2 [IU] via SUBCUTANEOUS

## 2022-04-18 NOTE — Progress Notes (Signed)
PROGRESS NOTE                                                                                                                                                                                                             Patient Demographics:    Randy Liu, is a 71 y.o. male, DOB - 27-Nov-1950, YIF:[id number]  Outpatient Primary MD for the patient is Felton Clinton, MD    LOS - 2  Admit date - 04/15/2022    Chief Complaint  Patient presents with   Chest Pain   Cough       Brief Narrative (HPI from H&P)   71 y.o. male with medical history significant of DM2, DVT/PE on eliquis, HTN, HFrEF, ICM, multiple NSTEMIs over past several months.  Bed / wheelchair bound at baseline.  CKD 3.   Most recently pt with NSTEMI in March.  Due to being poor candidate for CABG decision made to do high risk PCI and stent X2 in March.  Pt presents to ED today with c/o L sided CP, L arm pain, Nausea and vomiting.  Currently symptom-free.  Also had outpatient course of oral antibiotics for presumed bronchitis.   Subjective:    Patient in bed, appears comfortable, denies any headache, no fever, no chest pain or pressure, no shortness of breath , no abdominal pain. No new focal weakness.    Assessment  & Plan :    Chest pain with NSTEMI present on admission.  Had extensive work-up in March of this year and was deemed poor candidate for CABG, underwent PCI with 2 stents placed in March of this year.   Has been seen by his cardiologist Dr. Brynda Peon required heparin drip for 24 hours now on Plavix only, chest pain-free, continue low-dose beta-blocker, gets Repatha outpatient infusions along with fenofibrate which will be continued, troponin trend is stable, case discussed with cardiologist Dr. Doylene Canard on 04/17/2022, echo shows improved EF and no new wall motion abnormalities.    CKD 3A with baseline creatinine around 1.78 with hyperkalemia - in the  setting of hypotension and frank fluid overload, hold Entresto, Lokelma for hyperkalemia, hypotension has improved, renal function is plateauing around 1.9.  Hold further diuretics today and monitor.  Acute on chronic diastolic CHF EF now improved to 50%.  Repeat noted and discussed with cardiology.  He is currently  in fluid overload with 2+ lower extremity edema, continue beta-blocker, due to hyperkalemia and persistent hypotension on a chronic basis I do not think he will tolerate Entresto discussed with cardiology.  Currently see above for diuretic use.  Ongoing cough for several days.  Finished outpatient antibiotics for bronchitis, this chronic cough could have been due to fluid overload and CHF, negative viral panel, negative COVID test, procalcitonin stable.  Afebrile.  Continue to diurese and monitor  BPPV. Acute on chronic much improved after meclizine.  Ambulatory dysfunction  - Pt is bed bound at baseline.  Abortive care.  PT OT and monitor.  Wound on his upper back present on admission.  Wound care.    Essential hypertension - BP runs low, currently on diuretics plus beta-blocker.  Monitor and adjust.  Dyslipidemia Looks like he is on repatha, outpatient.  History of pulmonary embolism - on Eliquis .   Type 2 diabetes mellitus with hyperlipidemia (HCC) - currently on sliding scale.   Lab Results  Component Value Date   HGBA1C 7.4 (H) 04/16/2022   CBG (last 3)  Recent Labs    04/17/22 2007 04/18/22 0105 04/18/22 0418  GLUCAP 175* 194* 162*         Condition - Extremely Guarded  Family Communication  :  wife bedside 04/16/22  Code Status :  Full  Consults  :  Cards  PUD Prophylaxis : PPI   Procedures  :     TTE -    1. Left ventricular ejection fraction, by estimation, is 50 to 55%. The left ventricle has low normal function. The left ventricle demonstrates  regional wall motion abnormalities (see scoring diagram/findings for description). There is mild left  ventricular hypertrophy. Left ventricular diastolic parameters are consistent with Grade I diastolic dysfunction (impaired relaxation). Elevated left atrial pressure.  2. Right ventricular systolic function is normal. The right ventricular size is normal.  3. The mitral valve is grossly normal. No evidence of mitral valve regurgitation. No evidence of mitral stenosis.  4. The aortic valve is grossly normal. Aortic valve regurgitation is not visualized. No aortic stenosis is present.       Disposition Plan  :    Status is: Observation  DVT Prophylaxis  :  Hep gtt     Lab Results  Component Value Date   PLT 261 04/18/2022    Diet :  Diet Order             DIET SOFT Room service appropriate? Yes; Fluid consistency: Thin  Diet effective now                    Inpatient Medications  Scheduled Meds:  apixaban  5 mg Oral BID   clopidogrel  75 mg Oral Daily   fenofibrate  160 mg Oral Daily   insulin aspart  0-9 Units Subcutaneous Q4H   latanoprost  1 drop Left Eye QHS   meclizine  25 mg Oral TID   metoprolol tartrate  12.5 mg Oral BID   pantoprazole  40 mg Oral Daily   Continuous Infusions:   PRN Meds:.acetaminophen, nitroGLYCERIN, ondansetron (ZOFRAN) IV, polyethylene glycol  Antibiotics  :    Anti-infectives (From admission, onward)    None        Time Spent in minutes  30   Susa Raring M.D on 04/18/2022 at 8:54 AM  To page go to [email or web address]   [facility] -  Office  [phone]  See all Orders from today for  further details    Objective:   Vitals:   04/17/22 2000 04/17/22 2101 04/18/22 0000 04/18/22 0421  BP:  (!) 100/58  (!) 103/56  Pulse: 72 83  (!) 52  Resp: 17 20 18 11   Temp:  98 F (36.7 C)  97.8 F (36.6 C)  TempSrc:  Oral  Oral  SpO2: 96% 97% 99% 97%  Weight:    132.9 kg  Height:        Wt Readings from Last 3 Encounters:  04/18/22 132.9 kg  02/06/22 118.4 kg  01/22/22 118.4 kg     Intake/Output Summary (Last 24  hours) at 04/18/2022 0854 Last data filed at 04/18/2022 0423 Gross per 24 hour  Intake 120 ml  Output 3800 ml  Net -3680 ml     Physical Exam  Awake Alert, No new F.N deficits, Normal affect [place].AT,PERRAL Supple Neck, No JVD,   Symmetrical Chest wall movement, Good air movement bilaterally, CTAB RRR,No Gallops, Rubs or new Murmurs,  +ve B.Sounds, Abd Soft, No tenderness,   1+ leg edema   Data Review:    CBC Recent Labs  Lab 04/15/22 1954 04/17/22 0340 04/18/22 0208  WBC 12.4* 8.2 7.9  HGB 11.9* 12.3* 11.9*  HCT 36.8* 37.2* 35.4*  PLT 301 280 261  MCV 93.2 91.2 90.1  MCH 30.1 30.1 30.3  MCHC 32.3 33.1 33.6  RDW 15.8* 15.4 15.2  LYMPHSABS  --  2.9 2.6  MONOABS  --  0.7 0.7  EOSABS  --  0.2 0.1  BASOSABS  --  0.0 0.0    Electrolytes Recent Labs  Lab 04/15/22 1954 04/15/22 2143 04/16/22 0252 04/16/22 1114 04/16/22 2032 04/17/22 0340 04/17/22 0536 04/18/22 0208  NA 135  --  136  --   --  135  --  133*  K 5.7*  --  5.2*  --   --  4.8  --  4.3  CL 102  --  103  --   --  102  --  93*  CO2 25  --  26  --   --  21*  --  29  GLUCOSE 251*  --  196*  --   --  119*  --  172*  BUN 71*  --  64*  --   --  53*  --  49*  CREATININE 1.78*  --  1.73*  --   --  1.69*  --  1.91*  CALCIUM 8.7*  --  8.7*  --   --  9.1  --  9.5  MG  --   --   --   --   --  2.0  --  2.0  DDIMER  --  <0.27  --   --   --   --   --   --   PROCALCITON  --   --   --  <0.10  --   --  <0.10 <0.10  TSH  --   --   --   --  2.959  --   --   --   HGBA1C  --   --   --   --  7.4*  --   --   --   BNP 147.9*  --   --   --   --  128.1*  --  155.0*    ------------------------------------------------------------------------------------------------------------------ Recent Labs    04/17/22 0340 04/17/22 0536  CHOL  --  106  HDL  --  26*  LDLCALC  --  27  TRIG  --  140  CHOLHDL  --  4.1  LDLDIRECT 59  --     Lab Results  Component Value Date   HGBA1C 7.4 (H) 04/16/2022   Radiology  Reports ECHOCARDIOGRAM COMPLETE  Result Date: 04/17/2022    ECHOCARDIOGRAM REPORT   Patient Name:   Samil Samora Date of Exam: 04/16/2022 Medical Rec #:  [id number]          Height:       73.0 in Accession #:    [id number]         Weight:       273.0 lb Date of Birth:  1951/02/26          BSA:          2.456 m Patient Age:    21 years           BP:           112/55 mmHg Patient Gender: M                  HR:           67 bpm. Exam Location:  Inpatient Procedure: 2D Echo, Cardiac Doppler, Color Doppler and Intracardiac            Opacification Agent Indications:     Chest Pain R07.9  History:         Patient has prior history of Echocardiogram examinations, most                  recent 11/25/2021. CHF and Cardiomyopathy, CAD; Risk                  Factors:Diabetes, Dyslipidemia, Sleep Apnea and Hypertension.  Sonographer:     Darlina Sicilian RDCS Referring Phys:  Rex Kras Diagnosing Phys: Rex Kras DO  Sonographer Comments: Patient is morbidly obese and Technically difficult study due to poor echo windows. IMPRESSIONS  1. Left ventricular ejection fraction, by estimation, is 50 to 55%. The left ventricle has low normal function. The left ventricle demonstrates regional wall motion abnormalities (see scoring diagram/findings for description). There is mild left ventricular hypertrophy. Left ventricular diastolic parameters are consistent with Grade I diastolic dysfunction (impaired relaxation). Elevated left atrial pressure.  2. Right ventricular systolic function is normal. The right ventricular size is normal.  3. The mitral valve is grossly normal. No evidence of mitral valve regurgitation. No evidence of mitral stenosis.  4. The aortic valve is grossly normal. Aortic valve regurgitation is not visualized. No aortic stenosis is present. Comparison(s): Compared to 11/25/2021 LVEF improved from 40-45% to 50-55%. FINDINGS  Left Ventricle: Left ventricular ejection fraction, by estimation, is 50 to 55%. The  left ventricle has low normal function. The left ventricle demonstrates regional wall motion abnormalities. Definity contrast agent was given IV to delineate the left ventricular endocardial borders. The left ventricular internal cavity size was normal in size. There is mild left ventricular hypertrophy. Left ventricular diastolic parameters are consistent with Grade I diastolic dysfunction (impaired relaxation). Elevated left atrial pressure.  LV Wall Scoring: The entire apex is hypokinetic. Right Ventricle: The right ventricular size is normal. No increase in right ventricular wall thickness. Right ventricular systolic function is normal. Left Atrium: Left atrial size was normal in size. Right Atrium: Right atrial size was normal in size. Pericardium: There is no evidence of pericardial effusion. Presence of epicardial fat layer. Mitral Valve: The mitral valve is grossly normal. No evidence of mitral valve regurgitation.  No evidence of mitral valve stenosis. Tricuspid Valve: The tricuspid valve is grossly normal. Tricuspid valve regurgitation is trivial. No evidence of tricuspid stenosis. Aortic Valve: The aortic valve is grossly normal. Aortic valve regurgitation is not visualized. No aortic stenosis is present. Pulmonic Valve: The pulmonic valve was grossly normal. Pulmonic valve regurgitation is not visualized. Aorta: The aortic root and ascending aorta are structurally normal, with no evidence of dilitation. IAS/Shunts: The interatrial septum was not well visualized.  LEFT VENTRICLE PLAX 2D LVIDd:         4.60 cm      Diastology LVIDs:         2.50 cm      LV e' medial:    3.57 cm/s LV PW:         1.10 cm      LV E/e' medial:  20.4 LV IVS:        1.10 cm      LV e' lateral:   4.05 cm/s LVOT diam:     1.90 cm      LV E/e' lateral: 18.0 LV SV:         61 LV SV Index:   25 LVOT Area:     2.84 cm  LV Volumes (MOD) LV vol d, MOD A2C: 101.0 ml LV vol d, MOD A4C: 102.0 ml LV vol s, MOD A2C: 53.2 ml LV vol s, MOD  A4C: 55.6 ml LV SV MOD A2C:     47.8 ml LV SV MOD A4C:     102.0 ml LV SV MOD BP:      45.0 ml RIGHT VENTRICLE RV S prime:     13.10 cm/s TAPSE (M-mode): 1.1 cm LEFT ATRIUM           Index        RIGHT ATRIUM          Index LA diam:      2.70 cm 1.10 cm/m   RA Area:     8.76 cm LA Vol (A2C): 26.7 ml 10.87 ml/m  RA Volume:   13.90 ml 5.66 ml/m LA Vol (A4C): 33.5 ml 13.64 ml/m  AORTIC VALVE LVOT Vmax:   99.30 cm/s LVOT Vmean:  66.400 cm/s LVOT VTI:    0.215 m  AORTA Ao Asc diam: 3.40 cm MITRAL VALVE MV Area (PHT): 2.71 cm     SHUNTS MV Decel Time: 280 msec     Systemic VTI:  0.22 m MV E velocity: 72.80 cm/s   Systemic Diam: 1.90 cm MV A velocity: 107.00 cm/s MV E/A ratio:  0.68 Sunit Tolia DO Electronically signed by Rex Kras DO Signature Date/Time: 04/17/2022/8:08:16 AM    Final    DG Abd Portable 1V  Result Date: 04/16/2022 CLINICAL DATA:  70 year old male with a history of ileus EXAM: PORTABLE ABDOMEN - 1 VIEW COMPARISON:  08/10/2021 FINDINGS: Paucity of gas within stomach and small bowel. No air-fluid levels. No evidence of distension. Small volume of colonic gas within right colon, transverse colon, left colon. Overlying EKG leads. No unexpected radiopaque foreign body or calcification. No significant stool burden. IMPRESSION: Nonspecific bowel gas pattern without gaseous distension. Electronically Signed   By: Corrie Mckusick D.O.   On: 04/16/2022 08:30   DG Chest 2 View  Result Date: 04/15/2022 CLINICAL DATA:  Chest pain EXAM: CHEST - 2 VIEW COMPARISON:  Radiographs 11/24/2021 FINDINGS: Left basilar atelectasis similar to prior. No pleural effusion, or pneumothorax. Normal cardiomediastinal silhouette. No acute osseous abnormality. Aortic atherosclerotic calcification.  IMPRESSION: Scar versus atelectasis in the left base, similar to prior. Electronically Signed   By: Placido Sou M.D.   On: 04/15/2022 20:13

## 2022-04-18 NOTE — Progress Notes (Signed)
Progress Note  Patient Name: Randy Liu Date of Encounter: 04/18/2022  Attending physician: Thurnell Lose, MD Primary care provider: [name], MD Primary Cardiologist: Rex Kras, DO, [facility]  Subjective: Randy Liu is a 71 y.o. male who was seen and examined at bedside  Wife is present at bedside. Resting in bed comfortably using CPAP. No chest pain or heart failure symptoms. Case discussed and reviewed with his nurse.  Objective: Vital Signs in the last 24 hours: Temp:  [97.4 F (36.3 C)-98 F (36.7 C)] 97.4 F (36.3 C) (08/05 1250) Pulse Rate:  [52-83] 66 (08/05 0943) Resp:  [11-20] 15 (08/05 0937) BP: (100-118)/(50-58) 100/50 (08/05 1250) SpO2:  [96 %-99 %] 96 % (08/05 0937) Weight:  [132.9 kg] 132.9 kg (08/05 0421)  Intake/Output:  Intake/Output Summary (Last 24 hours) at 04/18/2022 1332 Last data filed at 04/18/2022 1100 Gross per 24 hour  Intake 360 ml  Output 3800 ml  Net -3440 ml    Net IO Since Admission: -5,186.67 mL [04/18/22 1332]  Weights:  Filed Weights   04/15/22 1853 04/17/22 0424 04/18/22 0421  Weight: 123.8 kg 131 kg 132.9 kg    Telemetry: Personally reviewed.  Sinus with rare PVCs.   Physical examination: PHYSICAL EXAM:    04/18/2022   12:50 PM 04/18/2022    9:43 AM 04/18/2022    9:37 AM  Vitals with BMI  Systolic 683 729 021  Diastolic 50 58 58  Pulse  66 60    CONSTITUTIONAL: Appears older than stated age, chronically bedbound, hemodynamically stable, no acute distress.   SKIN: Skin is warm and dry. No rash noted. No cyanosis. No pallor. No jaundice.   posterior wound HEAD: Normocephalic and atraumatic.  EYES: No scleral icterus MOUTH/THROAT: Moist oral membranes.  NECK: Difficult to evaluate JVP as he is supine and adipose tissue, no thyromegaly noted. No carotid bruits  CHEST Normal respiratory effort. No intercostal retractions  LUNGS: Good air entry noted laterally, rise and fall in chest cavity, no  rales/rhonchi/wheezing.   CARDIOVASCULAR: Regular rate and rhythm, positive J1-B5, soft systolic murmur, no gallops or rubs. ABDOMINAL: Obese, soft, nontender, nondistended, positive bowel sounds in all 4 quadrants, no apparent ascites.  EXTREMITIES: 2+ bilateral pitting edema, warm to touch, una wraps present HEMATOLOGIC: No significant bruising NEUROLOGIC: Oriented to person, place, and time. Nonfocal. Normal muscle tone.  PSYCHIATRIC: Normal mood and affect. Normal behavior. Cooperative  Lab Results: Hematology Recent Labs  Lab 04/15/22 1954 04/17/22 0340 04/18/22 0208  WBC 12.4* 8.2 7.9  RBC 3.95* 4.08* 3.93*  HGB 11.9* 12.3* 11.9*  HCT 36.8* 37.2* 35.4*  MCV 93.2 91.2 90.1  MCH 30.1 30.1 30.3  MCHC 32.3 33.1 33.6  RDW 15.8* 15.4 15.2  PLT 301 280 261    Chemistry Recent Labs  Lab 04/16/22 0252 04/17/22 0340 04/18/22 0208  NA 136 135 133*  K 5.2* 4.8 4.3  CL 103 102 93*  CO2 26 21* 29  GLUCOSE 196* 119* 172*  BUN 64* 53* 49*  CREATININE 1.73* 1.69* 1.91*  CALCIUM 8.7* 9.1 9.5  GFRNONAA 41* 42* 37*  ANIONGAP 7 12 11      Cardiac Enzymes: Cardiac Panel (last 3 results) Recent Labs    04/16/22 0659 04/16/22 2215 04/17/22 0340  TROPONINIHS 151* 76* 66*    BNP (last 3 results) Recent Labs    04/15/22 1954 04/17/22 0340 04/18/22 0208  BNP 147.9* 128.1* 155.0*    ProBNP (last 3 results) No results for input(s): "PROBNP" in the last  8760 hours.   DDimer  Recent Labs  Lab 04/15/22 2143  DDIMER <0.27     Hemoglobin A1c:  Lab Results  Component Value Date   HGBA1C 7.4 (H) 04/16/2022   MPG 165.68 04/16/2022    TSH  Recent Labs    04/16/22 2032  TSH 2.959    Lipid Panel     Component Value Date/Time   CHOL 106 04/17/2022 0536   TRIG 140 04/17/2022 0536   HDL 26 (L) 04/17/2022 0536   CHOLHDL 4.1 04/17/2022 0536   VLDL 28 04/17/2022 0536   LDLCALC 52 04/17/2022 0536   LDLDIRECT 59 04/17/2022 0340    Imaging: No results  found.  CARDIAC DATABASE: EKG: 04/16/2022: Sinus rhythm, 96 bpm, poor wave progression, consider old lateral infarct.   04/16/2022: Sinus rhythm, 69 bpm, poor R wave progression, left axis deviation, left anterior fascicular block, consider old lateral infarct.   Echocardiogram: 08/10/2021: LVEF 55 to 60%, trivial MR, estimated RAP 8 mmHg. 11/21/2021: LVEF 20-25%, severely reduced, global hypokinesis, RV systolic function mildly reduced, chronic pericardial fat pad.  11/25/2021: LVEF 40-45%.  04/16/2022: LVEF 50-55%, low normal ejection fraction, regional wall motion abnormalities (The entire apex is hypokinetic.), mild LVH, grade 1 diastolic impairment, no significant valvular heart disease.   Heart Catheterization: Coronary intervention 11/21/2021: LM: Normal LAD: Prox 100% occlusion         Diag 1 70% stenosis, followed by 100% occlusion Lcx: 99% stenosis in prox OM1 with minimal flow distally, which has progressed from previous angiogram in 07/2021        OM2 occluded RCA: Prox 50% stenosis (Present but under appreciated on previous angiogram in 07/2021)         Mid 75% stenosis (Present but under appreciated on previous angiogram in 07/2021)         This lesion either was more severe and occlusive with wires and balloons or developed spasm or dissection to render it flow limiting during the procedure, thus requiring stenting         Distal RCA into RPDA with 95% stenosis         Ostial-prox RPLA 90% stenosis   Successful IVUS guided percutaneous coronary intervention      PTCA RPLA ostium with 2.0 X 8 mm balloon     PTA and stent placement 2.5 X 18 mm Onyx Frontier DES distal RCA/RPDA             Post dilatation with 2.5 X 8 mm [place] balloon at 18 atm     PTCA and stent placement 3.0 X 18 mm Onyx Frontier DES mid RCA             Post dilatation with 3.5 X 12 mm [place] balloon at 22 atm   Intraprocedural hypotension and shock requiring IV norepinephrine up to 30 mics, now completely weaned  off Intraprocedural STE still persist, likely reflect intraprocedural no flow to RCA and to LAD via collaterals, likely causing myocardial stunning. Chest pain down from 7/10 to 1-2/10   He will need close monitoring of his chest pain, EKG, hemodynamics, urine output   Complex procedure due to intra procedural chest pain and ST elevation, diagnostic challenges responsible for slow flow, intra procedural hypotension requiring norepinephrine  Scheduled Meds:  apixaban  5 mg Oral BID   clopidogrel  75 mg Oral Daily   fenofibrate  160 mg Oral Daily   insulin aspart  0-9 Units Subcutaneous Q4H   latanoprost  1 drop Left Eye  QHS   meclizine  25 mg Oral TID   metoprolol tartrate  12.5 mg Oral BID   pantoprazole  40 mg Oral Daily    Continuous Infusions:   PRN Meds: acetaminophen, nitroGLYCERIN, ondansetron (ZOFRAN) IV, polyethylene glycol   IMPRESSION & RECOMMENDATIONS: Randy Liu is a 71 y.o. Caucasian male whose past medical history and cardiac risk factors include:  chronically bedbound, type 2 diabetes, gastroparesis, hyperlipidemia, statin intolerance, CKD stage IIIa, history of DVT on Eliquis, severe peripheral neuropathy, nonhealing GI ulcers status post duodenal polyp clipping, CAD status post PCI, recovered ischemic cardiomyopathy, chronic HFpEF, sleep apnea on CPAP.  Impression: Acute HFpEF, stage C, NYHA class II  Coronary artery disease with prior PCI without angina pectoris  Elevated troponin is likely secondary to supply demand ischemia in the setting of multivessel CAD, history of cardiomyopathy which is now recovered, HFpEF, recent viral infection, and acute renal insufficiency contributing to reduced clearance  Acute kidney injury likely secondary to diuresis/intravascular depletion  Benign essential hypertension  Hypercholesterolemia with history of statin intolerance  Sleep apnea on CPAP  Ambulatory dysfunction (bed bound)   Plan: Acute heart failure  with preserved EF, stage C, NYHA class II/III: On physical examination noted to be volume overloaded at the time of arrival/admission.   Started on parenteral diuretics with good urine output along with Unna compression stockings. Net IO Since Admission: -5,186.67 mL [04/18/22 1332] Given the acute kidney injury agree with holding parenteral diuretics/Entresto for now. At the time of discharge depending on renal function would like to send him home on torsemide 5 mg p.o. daily if possible. Continue telemetry. Reemphasized importance of low-salt diet.  Established coronary artery disease with prior PCI without angina pectoris: Denies angina pectoris. LVEF has improved when compared to prior studies. LDL currently at goal less than 55 mg/dL. Triglyceride levels have improved significantly from 313 mg/dL to 140 mg/dL Continue Repatha and Vascepa and fenofibrate. Currently on low-dose beta-blocker therapy. Was placed on short course of IV Heparin while being worked up; however, after the echo results IV Heparin gtt d/c'd.  No plans for additional cardiovascular testing at this time.  Hypercholesterolemia with history of statin intolerance: Currently on Repatha. Lipid profile reviewed. No medication changes warranted at this time.  Sleep apnea on CPAP: Reemphasized the importance of compliance.  We will continue to follow the patient peripherally during the current hospitalization.  Please reach out if any questions or concerns arise.  My recommendations conveyed to attending physician Dr. Candiss Norse as well.  Patient's questions and concerns were addressed to his satisfaction. He voices understanding of the instructions provided during this encounter.   This note was created using a voice recognition software as a result there may be grammatical errors inadvertently enclosed that do not reflect the nature of this encounter. Every attempt is made to correct such errors.  Total time spent: 25  minutes  Mechele Claude [facility]  Pager: [phone] Office: [phone] 04/18/2022, 1:32 PM

## 2022-04-19 DIAGNOSIS — I214 Non-ST elevation (NSTEMI) myocardial infarction: Secondary | ICD-10-CM | POA: Diagnosis not present

## 2022-04-19 LAB — BASIC METABOLIC PANEL
Anion gap: 10 (ref 5–15)
BUN: 49 mg/dL — ABNORMAL HIGH (ref 8–23)
CO2: 28 mmol/L (ref 22–32)
Calcium: 9 mg/dL (ref 8.9–10.3)
Chloride: 94 mmol/L — ABNORMAL LOW (ref 98–111)
Creatinine, Ser: 1.89 mg/dL — ABNORMAL HIGH (ref 0.61–1.24)
GFR, Estimated: 37 mL/min — ABNORMAL LOW (ref >60)
Glucose, Bld: 251 mg/dL — ABNORMAL HIGH (ref 70–99)
Potassium: 4.2 mmol/L (ref 3.5–5.1)
Sodium: 132 mmol/L — ABNORMAL LOW (ref 135–145)

## 2022-04-19 LAB — MAGNESIUM: Magnesium: 2 mg/dL (ref 1.7–2.4)

## 2022-04-19 LAB — GLUCOSE, CAPILLARY: Glucose-Capillary: 269 mg/dL — ABNORMAL HIGH (ref 70–99)

## 2022-04-19 LAB — BRAIN NATRIURETIC PEPTIDE: B Natriuretic Peptide: 142.6 pg/mL — ABNORMAL HIGH (ref 0.0–100.0)

## 2022-04-19 MED ORDER — GUAIFENESIN-DM 100-10 MG/5ML PO SYRP
15.0000 mL | ORAL_SOLUTION | ORAL | Status: DC | PRN
  Administered 2022-04-19: 15 mL via ORAL
  Filled 2022-04-19: qty 15

## 2022-04-19 NOTE — Discharge Summary (Signed)
Randy Liu [id number] DOB: 1951/08/11 DOA: 04/15/2022  PCP: [name], MD  Admit date: 04/15/2022  Discharge date: 04/19/2022  Admitted From: Home   Disposition:  Home   Recommendations for Outpatient Follow-up:   Follow up with PCP in 1-2 weeks  PCP Please obtain BMP/CBC, 2 view CXR in 1week,  (see Discharge instructions)   PCP Please follow up on the following pending results: Monitor BMP, magnesium, fluid status and diuretic dose closely.   Home Health: PT, RN if he qualifies   Equipment/Devices: None  Consultations: Cards Discharge Condition: Stable    CODE STATUS: Full    Diet Recommendation: Heart Healthy low carbohydrate diet with 1500 cc fluid restriction per day.  Chief Complaint  Patient presents with   Chest Pain   Cough     Brief history of present illness from the day of admission and additional interim summary    71 y.o. male with medical history significant of DM2, DVT/PE on eliquis, HTN, HFrEF, ICM, multiple NSTEMIs over past several months.  Bed / wheelchair bound at baseline.  CKD 3.   Most recently pt with NSTEMI in March.  Due to being poor candidate for CABG decision made to do high risk PCI and stent X2 in March.  Pt presents to ED today with c/o L sided CP, L arm pain, Nausea and vomiting.  Currently symptom-free.  Also had outpatient course of oral antibiotics for presumed bronchitis.                                                                 Hospital Course    Chest pain with NSTEMI present on admission.  Had extensive work-up in March of this year and was deemed poor candidate for CABG, underwent PCI with 2 stents placed in March of this year.   Has been seen by his cardiologist Dr. Terri Skains required heparin drip for 24 hours now on Plavix only, chest pain-free,  continue low-dose beta-blocker, gets Repatha outpatient infusions along with fenofibrate which will be continued, troponin trend is stable, case discussed with cardiologist Dr. Terri Skains on 04/17/2022, echo shows improved EF and no new wall motion abnormalities.  Per Dr. Margaretann Loveless stable for home discharge on home medications except for [facility].   CKD 3A with baseline creatinine around 1.78 with hyperkalemia - in the setting of hypotension and frank fluid overload, hold Entresto, Lokelma for hyperkalemia, hypotension has improved, renal function plateaued and now gradually taking down, resume home dose diuretic upon discharge, continue to hold Entresto as blood pressure is still low and renal function still not back to baseline.   Acute on chronic diastolic CHF EF now improved to 50%.  Repeat noted and discussed with cardiology.  He is currently in fluid overload with 2+ lower extremity edema, continue beta-blocker, due to hyperkalemia  and persistent hypotension on a chronic basis I do not think he will tolerate Entresto discussed with cardiology.  Currently see above for diuretic use.   Ongoing cough for several days.  Finished outpatient antibiotics for bronchitis, this chronic cough could have been due to fluid overload and CHF, negative viral panel, negative COVID test, procalcitonin stable.  Afebrile.  Continue to diurese and monitor, cough I think was likely due to fluid overload.   BPPV. Acute on chronic much improved after meclizine.  Currently symptom-free.   Ambulatory dysfunction  - Pt is bed bound at baseline.  Supportive care, home PT if he qualifies.   Wound on his upper back present on admission.  Wound care.  Ordered RN at home if he qualifies.   Essential hypertension - BP runs low, currently on diuretics plus beta-blocker.  Monitor and adjust.   Dyslipidemia Looks like he is on repatha, outpatient.   History of pulmonary embolism - on Eliquis .   Type 2 diabetes mellitus with  hyperlipidemia (HCC) - continue home regimen.   Discharge diagnosis     Principal Problem:   NSTEMI (non-ST elevated myocardial infarction) (HCC) Active Problems:   AKI (acute kidney injury) (HCC)   Acute on chronic HFrEF (heart failure with reduced ejection fraction) (HCC)   Hyperkalemia   Type 2 diabetes mellitus with hyperlipidemia (HCC)   History of pulmonary embolism   Stage 3a chronic kidney disease (CKD) (HCC)   Dyslipidemia   Essential hypertension   Wound of back   Ambulatory dysfunction    Discharge instructions    Discharge Instructions     Discharge instructions   Complete by: As directed    Follow with Primary MD Felton Clinton, MD in 2-3 days   Get CBC, CMP, magnesium-  checked next visit within 2-3 days by Primary MD   Activity: As tolerated with Full fall precautions use walker/cane & assistance as needed  Disposition Home   Diet: Heart Healthy low carbohydrate diet with strict 1.5 L fluid restriction, check CBGs q. ACH S..  Check your Weight same time everyday, if you gain over 2 pounds, or you develop in leg swelling, experience more shortness of breath or chest pain, call your Primary MD immediately. Follow Cardiac Low Salt Diet and 1.5 lit/day fluid restriction.  Special Instructions: If you have smoked or chewed Tobacco  in the last 2 yrs please stop smoking, stop any regular Alcohol  and or any Recreational drug use.  On your next visit with your primary care physician please Get Medicines reviewed and adjusted.  Please request your Prim.MD to go over all Hospital Tests and Procedure/Radiological results at the follow up, please get all Hospital records sent to your Prim MD by signing hospital release before you go home.  If you experience worsening of your admission symptoms, develop shortness of breath, life threatening emergency, suicidal or homicidal thoughts you must seek medical attention immediately by calling 911 or calling your MD  immediately  if symptoms less severe.  You Must read complete instructions/literature along with all the possible adverse reactions/side effects for all the Medicines you take and that have been prescribed to you. Take any new Medicines after you have completely understood and accpet all the possible adverse reactions/side effects.   Discharge wound care:   Complete by: As directed    Pack left posterior shoulder wound Q day (Use packing strip Lawson # 229 and swab to fill) Cover with foam dressing and change foam dressing Q 3  days or PRN soiling.)   Increase activity slowly   Complete by: As directed        Discharge Medications   Allergies as of 04/19/2022       Reactions   Nsaids Other (See Comments)   Renal Insufficiency Kidney issues   Reglan [metoclopramide] Itching   Loss of Balance Urinary Incontinence Can tolerate low dose at bedtime   Augmentin [amoxicillin-pot Clavulanate] Diarrhea, Nausea And Vomiting   Abdomen pain   Statins Other (See Comments)   Myalgias Myositis   Chocolate Itching   Can tolerate if taking Benadryl   Other Other (See Comments)   All nuts - told to avoid due to GI tract issues   Spiriva Respimat [tiotropium Bromide Monohydrate] Other (See Comments)   Urinary retention   Strawberry (diagnostic) Itching   Can tolerate if taking Benadryl   Jardiance [empagliflozin] Other (See Comments)   Fainting Weakness  Lightheaded  Falling    Zyloprim [allopurinol] Rash        Medication List     STOP taking these medications    Entresto 24-26 MG Generic drug: sacubitril-valsartan       TAKE these medications    acetaminophen 500 MG tablet Commonly known as: TYLENOL Take 1,000 mg by mouth every 8 (eight) hours as needed for moderate pain.   Align 4 MG Caps Take 4 mg by mouth daily.   Alpha-Lipoic Acid 300 MG Tabs Take 600 mg by mouth daily.   benzonatate 200 MG capsule Commonly known as: TESSALON Take 200 mg by mouth every 8  (eight) hours as needed for cough.   Cholecalciferol 25 MCG (1000 UT) capsule Take 4,000 Units by mouth daily.   clopidogrel 75 MG tablet Commonly known as: PLAVIX TAKE ONE (1) TABLET BY MOUTH EVERY DAY What changed: how much to take   colchicine-probenecid 0.5-500 MG tablet Take 1 tablet by mouth daily.   cyanocobalamin 1000 MCG/ML injection Commonly known as: VITAMIN B12 Inject 1,000 mcg into the skin every 30 (thirty) days.   Eliquis 5 MG Tabs tablet Generic drug: apixaban Take 1 tablet (5 mg total) by mouth 2 (two) times daily. Resume on 05/11/21 What changed: additional instructions   fenofibrate 160 MG tablet Take 160 mg by mouth daily.   fluticasone 50 MCG/ACT nasal spray Commonly known as: FLONASE Place 1 spray into both nostrils as needed.   guaiFENesin-codeine 100-10 MG/5ML syrup Take 5 mLs by mouth 3 (three) times daily as needed for cough.   insulin aspart protamine- aspart (70-30) 100 UNIT/ML injection Commonly known as: NOVOLOG MIX 70/30 Inject 40-85 Units into the skin 2 (two) times daily. Sliding scale:  100-140 : 40 units  141-180 : 45 units  181-220 : 50 units  221-260 : 55 units  261-300 : 60 units  301-340 : 65 units  341-380 : 70 units  381-420 : 75 units  421-460 : 80 units  461-500 : 85 units - recheck in 1 hour and notify MD   latanoprost 0.005 % ophthalmic solution Commonly known as: XALATAN Place 1 drop into the left eye at bedtime.   loratadine 10 MG tablet Commonly known as: CLARITIN Take 10 mg by mouth daily.   magnesium oxide 400 MG tablet Commonly known as: MAG-OX Take 1 tablet (400 mg total) by mouth 3 (three) times daily.   meclizine 25 MG tablet Commonly known as: ANTIVERT Take 25 mg by mouth 3 (three) times daily as needed for nausea.   melatonin 5 MG Tabs  Take 5 mg by mouth at bedtime as needed (sleep).   metoCLOPramide 5 MG tablet Commonly known as: REGLAN Take 2.5 mg by mouth at bedtime.   metoprolol succinate  25 MG 24 hr tablet Commonly known as: TOPROL-XL TAKE 1/2 TABLET BY MOUTH DAILY   montelukast 10 MG tablet Commonly known as: SINGULAIR Take 10 mg by mouth at bedtime.   nitroGLYCERIN 0.4 MG SL tablet Commonly known as: NITROSTAT Place 0.4 mg under the tongue every 5 (five) minutes as needed for chest pain.   ondansetron 4 MG disintegrating tablet Commonly known as: ZOFRAN-ODT Take 1 tablet (4 mg total) by mouth every 8 (eight) hours as needed for nausea or vomiting.   pantoprazole 40 MG tablet Commonly known as: PROTONIX Take 1 tablet (40 mg total) by mouth 2 (two) times daily.   Pepcid Complete 10-800-165 MG chewable tablet Generic drug: famotidine-calcium carbonate-magnesium hydroxide Chew 1 tablet by mouth daily as needed (heartburn).   polyethylene glycol 17 g packet Commonly known as: MIRALAX / GLYCOLAX Take 17 g by mouth 2 (two) times daily. What changed:  when to take this reasons to take this   ProAir RespiClick [id number] (90 Base) MCG/ACT Aepb Generic drug: Albuterol Sulfate Inhale 2 puffs into the lungs every 6 (six) hours as needed (wheezing, shortness of breath).   albuterol 108 (90 Base) MCG/ACT inhaler Commonly known as: VENTOLIN HFA Inhale 2 puffs into the lungs every 6 (six) hours as needed for wheezing or shortness of breath.   Repatha SureClick [id number] MG/ML Soaj Generic drug: Evolocumab Inject 1 mL into the skin every 14 (fourteen) days.   sennosides-docusate sodium 8.6-50 MG tablet Commonly known as: SENOKOT-S Take 1 tablet by mouth daily.   sodium chloride 5 % ophthalmic ointment Commonly known as: MURO 0000000 Place 1 application  into both eyes at bedtime as needed for eye irritation.   sucralfate 1 GM/10ML suspension Commonly known as: CARAFATE Take 10 mLs (1 g total) by mouth 2 (two) times daily.   torsemide 10 MG tablet Commonly known as: DEMADEX Take 0.5 tablets (5 mg total) by mouth daily.   Vascepa 1 g capsule Generic drug: icosapent  Ethyl TAKE ONE TABLET BY MOUTH TWICE DAILY               Durable Medical Equipment  (From admission, onward)           Start     Ordered   04/17/22 1514  For home use only DME Other see comment  Once       Comments: Electric Hoyer lift for transfers.  Question:  Length of Need  Answer:  Lifetime   04/17/22 1514              Discharge Care Instructions  (From admission, onward)           Start     Ordered   04/19/22 0000  Discharge wound care:       Comments: Pack left posterior shoulder wound Q day (Use packing strip Lawson # 229 and swab to fill) Cover with foam dressing and change foam dressing Q 3 days or PRN soiling.)   04/19/22 0932             Follow-up Information     [facility] Follow up.   Why: Clinical research associate and Drive 5 Zone therapeutic mattress- [employer] will discuss with spouse on delivery. Contact information: [place] [address] [phone]  [facility] Follow up.   Specialty: Home Health Services Why: Also known as Archivist- Designer, jewellery, Physical Therapy, Occupational Therapy-office to call the patient for visit times. Contact information: [address] [place] [address] [phone]         Felton Clinton, MD. Schedule an appointment as soon as possible for a visit in 3 day(s).   Specialty: Internal Medicine Contact information: [address] [place] [address] [phone]         Tessa Lerner, DO. Schedule an appointment as soon as possible for a visit in 2 day(s).   Specialties: Cardiology, Vascular Surgery Contact information: [address] [place] [place] [place] [address] [phone]                 Major procedures and Radiology Reports - PLEASE review detailed and final reports thoroughly  -       ECHOCARDIOGRAM COMPLETE  Result Date: 04/17/2022    ECHOCARDIOGRAM REPORT   Patient Name:    Randy Liu Date of Exam: 04/16/2022 Medical Rec #:  [id number]          Height:       73.0 in Accession #:    [id number]         Weight:       273.0 lb Date of Birth:  09/08/1951          BSA:          2.456 m Patient Age:    71 years           BP:           112/55 mmHg Patient Gender: M                  HR:           67 bpm. Exam Location:  Inpatient Procedure: 2D Echo, Cardiac Doppler, Color Doppler and Intracardiac            Opacification Agent Indications:     Chest Pain R07.9  History:         Patient has prior history of Echocardiogram examinations, most                  recent 11/25/2021. CHF and Cardiomyopathy, CAD; Risk                  Factors:Diabetes, Dyslipidemia, Sleep Apnea and Hypertension.  Sonographer:     Leta Jungling RDCS Referring Phys:  Tessa Lerner Diagnosing Phys: Tessa Lerner DO  Sonographer Comments: Patient is morbidly obese and Technically difficult study due to poor echo windows. IMPRESSIONS  1. Left ventricular ejection fraction, by estimation, is 50 to 55%. The left ventricle has low normal function. The left ventricle demonstrates regional wall motion abnormalities (see scoring diagram/findings for description). There is mild left ventricular hypertrophy. Left ventricular diastolic parameters are consistent with Grade I diastolic dysfunction (impaired relaxation). Elevated left atrial pressure.  2. Right ventricular systolic function is normal. The right ventricular size is normal.  3. The mitral valve is grossly normal. No evidence of mitral valve regurgitation. No evidence of mitral stenosis.  4. The aortic valve is grossly normal. Aortic valve regurgitation is not visualized. No aortic stenosis is present. Comparison(s): Compared to 11/25/2021 LVEF improved from 40-45% to 50-55%. FINDINGS  Left Ventricle: Left ventricular ejection fraction, by estimation, is 50 to 55%. The left ventricle has low normal function. The left ventricle demonstrates regional wall  motion  abnormalities. Definity contrast agent was given IV to delineate the left ventricular endocardial borders. The left ventricular internal cavity size was normal in size. There is mild left ventricular hypertrophy. Left ventricular diastolic parameters are consistent with Grade I diastolic dysfunction (impaired relaxation). Elevated left atrial pressure.  LV Wall Scoring: The entire apex is hypokinetic. Right Ventricle: The right ventricular size is normal. No increase in right ventricular wall thickness. Right ventricular systolic function is normal. Left Atrium: Left atrial size was normal in size. Right Atrium: Right atrial size was normal in size. Pericardium: There is no evidence of pericardial effusion. Presence of epicardial fat layer. Mitral Valve: The mitral valve is grossly normal. No evidence of mitral valve regurgitation. No evidence of mitral valve stenosis. Tricuspid Valve: The tricuspid valve is grossly normal. Tricuspid valve regurgitation is trivial. No evidence of tricuspid stenosis. Aortic Valve: The aortic valve is grossly normal. Aortic valve regurgitation is not visualized. No aortic stenosis is present. Pulmonic Valve: The pulmonic valve was grossly normal. Pulmonic valve regurgitation is not visualized. Aorta: The aortic root and ascending aorta are structurally normal, with no evidence of dilitation. IAS/Shunts: The interatrial septum was not well visualized.  LEFT VENTRICLE PLAX 2D LVIDd:         4.60 cm      Diastology LVIDs:         2.50 cm      LV e' medial:    3.57 cm/s LV PW:         1.10 cm      LV E/e' medial:  20.4 LV IVS:        1.10 cm      LV e' lateral:   4.05 cm/s LVOT diam:     1.90 cm      LV E/e' lateral: 18.0 LV SV:         61 LV SV Index:   25 LVOT Area:     2.84 cm  LV Volumes (MOD) LV vol d, MOD A2C: 101.0 ml LV vol d, MOD A4C: 102.0 ml LV vol s, MOD A2C: 53.2 ml LV vol s, MOD A4C: 55.6 ml LV SV MOD A2C:     47.8 ml LV SV MOD A4C:     102.0 ml LV SV MOD BP:      45.0 ml  RIGHT VENTRICLE RV S prime:     13.10 cm/s TAPSE (M-mode): 1.1 cm LEFT ATRIUM           Index        RIGHT ATRIUM          Index LA diam:      2.70 cm 1.10 cm/m   RA Area:     8.76 cm LA Vol (A2C): 26.7 ml 10.87 ml/m  RA Volume:   13.90 ml 5.66 ml/m LA Vol (A4C): 33.5 ml 13.64 ml/m  AORTIC VALVE LVOT Vmax:   99.30 cm/s LVOT Vmean:  66.400 cm/s LVOT VTI:    0.215 m  AORTA Ao Asc diam: 3.40 cm MITRAL VALVE MV Area (PHT): 2.71 cm     SHUNTS MV Decel Time: 280 msec     Systemic VTI:  0.22 m MV E velocity: 72.80 cm/s   Systemic Diam: 1.90 cm MV A velocity: 107.00 cm/s MV E/A ratio:  0.68 Sunit Tolia DO Electronically signed by Rex Kras DO Signature Date/Time: 04/17/2022/8:08:16 AM    Final    DG Abd Portable 1V  Result Date: 04/16/2022 CLINICAL DATA:  71 year old male  with a history of ileus EXAM: PORTABLE ABDOMEN - 1 VIEW COMPARISON:  08/10/2021 FINDINGS: Paucity of gas within stomach and small bowel. No air-fluid levels. No evidence of distension. Small volume of colonic gas within right colon, transverse colon, left colon. Overlying EKG leads. No unexpected radiopaque foreign body or calcification. No significant stool burden. IMPRESSION: Nonspecific bowel gas pattern without gaseous distension. Electronically Signed   By: Corrie Mckusick D.O.   On: 04/16/2022 08:30   DG Chest 2 View  Result Date: 04/15/2022 CLINICAL DATA:  Chest pain EXAM: CHEST - 2 VIEW COMPARISON:  Radiographs 11/24/2021 FINDINGS: Left basilar atelectasis similar to prior. No pleural effusion, or pneumothorax. Normal cardiomediastinal silhouette. No acute osseous abnormality. Aortic atherosclerotic calcification. IMPRESSION: Scar versus atelectasis in the left base, similar to prior. Electronically Signed   By: Placido Sou M.D.   On: 04/15/2022 20:13    Micro Results     Recent Results (from the past 240 hour(s))  SARS Coronavirus 2 by RT PCR (hospital order, performed in [facility] hospital lab) *cepheid single result test*  Anterior Nasal Swab     Status: None   Collection Time: 04/16/22  7:36 AM   Specimen: Anterior Nasal Swab  Result Value Ref Range Status   SARS Coronavirus 2 by RT PCR NEGATIVE NEGATIVE Final    Comment: (NOTE) SARS-CoV-2 target nucleic acids are NOT DETECTED.  The SARS-CoV-2 RNA is generally detectable in upper and lower respiratory specimens during the acute phase of infection. The lowest concentration of SARS-CoV-2 viral copies this assay can detect is 250 copies / mL. A negative result does not preclude SARS-CoV-2 infection and should not be used as the sole basis for treatment or other patient management decisions.  A negative result may occur with improper specimen collection / handling, submission of specimen other than nasopharyngeal swab, presence of viral mutation(s) within the areas targeted by this assay, and inadequate number of viral copies (<250 copies / mL). A negative result must be combined with clinical observations, patient history, and epidemiological information.  Fact Sheet for Patients:   https://www.patel.info/  Fact Sheet for Healthcare Providers: https://hall.com/  This test is not yet approved or  cleared by the [place] FDA and has been authorized for detection and/or diagnosis of SARS-CoV-2 by FDA under an Emergency Use Authorization (EUA).  This EUA will remain in effect (meaning this test can be used) for the duration of the COVID-19 declaration under Section 564(b)(1) of the Act, 21 U.S.C. section 360bbb-3(b)(1), unless the authorization is terminated or revoked sooner.  Performed at [facility], [place] [address] [place], [place] [address]   Respiratory (~20 pathogens) panel by PCR     Status: None   Collection Time: 04/16/22 10:53 AM   Specimen: Nasopharyngeal Swab; Respiratory  Result Value Ref Range Status   Adenovirus NOT DETECTED NOT DETECTED Final   Coronavirus 229E NOT DETECTED NOT  DETECTED Final    Comment: (NOTE) The Coronavirus on the Respiratory Panel, DOES NOT test for the novel  Coronavirus (2019 nCoV)    Coronavirus HKU1 NOT DETECTED NOT DETECTED Final   Coronavirus NL63 NOT DETECTED NOT DETECTED Final   Coronavirus OC43 NOT DETECTED NOT DETECTED Final   Metapneumovirus NOT DETECTED NOT DETECTED Final   Rhinovirus / Enterovirus NOT DETECTED NOT DETECTED Final   Influenza A NOT DETECTED NOT DETECTED Final   Influenza B NOT DETECTED NOT DETECTED Final   Parainfluenza Virus 1 NOT DETECTED NOT DETECTED Final   Parainfluenza Virus  2 NOT DETECTED NOT DETECTED Final   Parainfluenza Virus 3 NOT DETECTED NOT DETECTED Final   Parainfluenza Virus 4 NOT DETECTED NOT DETECTED Final   Respiratory Syncytial Virus NOT DETECTED NOT DETECTED Final   Bordetella pertussis NOT DETECTED NOT DETECTED Final   Bordetella Parapertussis NOT DETECTED NOT DETECTED Final   Chlamydophila pneumoniae NOT DETECTED NOT DETECTED Final   Mycoplasma pneumoniae NOT DETECTED NOT DETECTED Final    Comment: Performed at [facility], [place] [address] [place], [place] [address]    Today   Subjective    Kingsly Scurry today has no headache,no chest abdominal pain,no new weakness tingling or numbness, feels much better wants to go home today.   Objective   Blood pressure (!) 114/50, pulse 68, temperature 97.9 F (36.6 C), temperature source Oral, resp. rate 18, height 6\' 1"  (1.854 m), weight 127 kg, SpO2 95 %.   Intake/Output Summary (Last 24 hours) at 04/19/2022 0932 Last data filed at 04/19/2022 0547 Gross per 24 hour  Intake 480 ml  Output 1600 ml  Net -1120 ml    Exam  Awake Alert, No new F.N deficits,    [place].AT,PERRAL Supple Neck,   Symmetrical Chest wall movement, Good air movement bilaterally, CTAB RRR,No Gallops,   +ve B.Sounds, Abd Soft, Non tender,  1+ edema, UNNA boots   Data Review   Recent Labs  Lab 04/15/22 1954 04/17/22 0340 04/18/22 0208  WBC 12.4*  8.2 7.9  HGB 11.9* 12.3* 11.9*  HCT 36.8* 37.2* 35.4*  PLT 301 280 261  MCV 93.2 91.2 90.1  MCH 30.1 30.1 30.3  MCHC 32.3 33.1 33.6  RDW 15.8* 15.4 15.2  LYMPHSABS  --  2.9 2.6  MONOABS  --  0.7 0.7  EOSABS  --  0.2 0.1  BASOSABS  --  0.0 0.0    Recent Labs  Lab 04/15/22 1954 04/15/22 2143 04/16/22 0252 04/16/22 1114 04/16/22 2032 04/17/22 0340 04/17/22 0536 04/18/22 0208 04/19/22 0327  NA 135  --  136  --   --  135  --  133* 132*  K 5.7*  --  5.2*  --   --  4.8  --  4.3 4.2  CL 102  --  103  --   --  102  --  93* 94*  CO2 25  --  26  --   --  21*  --  29 28  GLUCOSE 251*  --  196*  --   --  119*  --  172* 251*  BUN 71*  --  64*  --   --  53*  --  49* 49*  CREATININE 1.78*  --  1.73*  --   --  1.69*  --  1.91* 1.89*  CALCIUM 8.7*  --  8.7*  --   --  9.1  --  9.5 9.0  MG  --   --   --   --   --  2.0  --  2.0 2.0  DDIMER  --  <0.27  --   --   --   --   --   --   --   PROCALCITON  --   --   --  <0.10  --   --  <0.10 <0.10  --   TSH  --   --   --   --  2.959  --   --   --   --   HGBA1C  --   --   --   --  7.4*  --   --   --   --   BNP 147.9*  --   --   --   --  128.1*  --  155.0* 142.6*    Total Time in preparing paper work, data evaluation and todays exam - 9 minutes  Lala Lund M.D on 04/19/2022 at 9:32 AM  [facility] Hospitalists

## 2022-04-19 NOTE — Discharge Instructions (Signed)
Follow with Primary MD Felton Clinton, MD in 2-3 days   Get CBC, CMP, magnesium-  checked next visit within 2-3 days by Primary MD   Activity: As tolerated with Full fall precautions use walker/cane & assistance as needed  Disposition Home   Diet: Heart Healthy low carbohydrate diet with strict 1.5 L fluid restriction, check CBGs q. ACH S..  Check your Weight same time everyday, if you gain over 2 pounds, or you develop in leg swelling, experience more shortness of breath or chest pain, call your Primary MD immediately. Follow Cardiac Low Salt Diet and 1.5 lit/day fluid restriction.  Special Instructions: If you have smoked or chewed Tobacco  in the last 2 yrs please stop smoking, stop any regular Alcohol  and or any Recreational drug use.  On your next visit with your primary care physician please Get Medicines reviewed and adjusted.  Please request your Prim.MD to go over all Hospital Tests and Procedure/Radiological results at the follow up, please get all Hospital records sent to your Prim MD by signing hospital release before you go home.  If you experience worsening of your admission symptoms, develop shortness of breath, life threatening emergency, suicidal or homicidal thoughts you must seek medical attention immediately by calling 911 or calling your MD immediately  if symptoms less severe.  You Must read complete instructions/literature along with all the possible adverse reactions/side effects for all the Medicines you take and that have been prescribed to you. Take any new Medicines after you have completely understood and accpet all the possible adverse reactions/side effects.

## 2022-04-19 NOTE — TOC Transition Note (Signed)
Transition of Care (TOC) - CM/SW Discharge Note Donn Pierini RN, BSN Transitions of Care Unit 4E- RN Case Manager See Treatment Team for direct phone #  Weekend cross coverage [phone]  Patient Details  Name: Randy Liu MRN: [id number] Date of Birth: 04/07/1951  Transition of Care [name] CM/SW Contact:  Darrold Span, RN Phone Number: 04/19/2022, 10:22 AM   Clinical Narrative:    Pt stable for transition home today, HH has been resumed w/ [facility] and new orders placed. CM has notified Marylene Land w/ [facility] for resumption of care for [name].  CM made a TC to wife to check on DME- per wife she has spoken with Elease Hashimoto at [employer] and is to call on discharge to set up delivery of ordered DME- wife states she has just arrived to hospital and will make call to [employer] when she reaches room.  Wife states she is ready for transport at anytime.   Address confirmed w/ wife for EMS transport. Bedside RN also reports pt is ready for transport.   [facility] called for transport- ETA per [facility] is within then hour. Paperwork placed on chart for transport.    Final next level of care: Home w Home Health Services Barriers to Discharge: Barriers Resolved   Patient Goals and CMS Choice Patient states their goals for this hospitalization and ongoing recovery are:: to return home with home health      Discharge Placement               Home w/ [facility]        Discharge Plan and Services In-house Referral: NA Discharge Planning Services: CM Consult Post Acute Care Choice: Home Health          DME Arranged:  (hoyer lift wife asked for new mattress.) DME Agency: [facility] Date DME Agency Contacted: 04/17/22 Time DME Agency Contacted: 1500 Representative spoke with at DME Agency: Zack HH Arranged: RN, Disease Management, PT, OT HH Agency: Other - See comment Cindie Laroche) Date HH Agency Contacted: 04/17/22   Representative spoke with at [name] Agency: Marylene Land  Social Determinants of Health (SDOH)  Interventions     Readmission Risk Interventions    04/19/2022   10:21 AM 11/25/2021    3:43 PM 09/02/2021    3:35 PM  Readmission Risk Prevention Plan  Transportation Screening Complete Complete Complete  PCP or Specialist Appt within 3-5 Days Complete    HRI or Home Care Consult Complete    Social Work Consult for Recovery Care Planning/Counseling Complete    Palliative Care Screening Not Applicable    Medication Review Oceanographer) Complete  Complete  HRI or Home Care Consult  Complete Complete  SW Recovery Care/Counseling Consult  Complete Complete  Palliative Care Screening   Not Applicable  Skilled Nursing Facility  Complete Not Applicable

## 2022-04-20 ENCOUNTER — Telehealth: Payer: Self-pay | Admitting: Cardiology

## 2022-04-20 NOTE — Telephone Encounter (Signed)
Monday, telephone visit at the end of the day.   Dr. Terri Skains

## 2022-04-20 NOTE — Telephone Encounter (Signed)
[name] states hospital discharge paperwork mentions patient needs to follow up in a few days. Patient will see PCP on Wednesday 8/9, and she will draw the labs. [name] says all labs can be drawn and results should be back minus Pro BNP. She wants to know when they should schedule the telehealth visit with you, since this result may not come back as soon as the others. She mentioned a visit on Friday. There is no room in your schedule, so please tell me where to schedule and we will do so.

## 2022-04-23 ENCOUNTER — Other Ambulatory Visit: Payer: Self-pay | Admitting: Cardiology

## 2022-04-27 ENCOUNTER — Ambulatory Visit: Payer: Medicare Other | Admitting: Cardiology

## 2022-04-27 ENCOUNTER — Encounter: Payer: Self-pay | Admitting: Cardiology

## 2022-04-27 VITALS — BP 112/62 | HR 74 | Temp 97.8°F | Resp 16 | Ht 73.0 in | Wt 279.0 lb

## 2022-04-27 DIAGNOSIS — I252 Old myocardial infarction: Secondary | ICD-10-CM

## 2022-04-27 DIAGNOSIS — Z86718 Personal history of other venous thrombosis and embolism: Secondary | ICD-10-CM

## 2022-04-27 DIAGNOSIS — I2510 Atherosclerotic heart disease of native coronary artery without angina pectoris: Secondary | ICD-10-CM

## 2022-04-27 DIAGNOSIS — R262 Difficulty in walking, not elsewhere classified: Secondary | ICD-10-CM

## 2022-04-27 DIAGNOSIS — I5032 Chronic diastolic (congestive) heart failure: Secondary | ICD-10-CM

## 2022-04-27 DIAGNOSIS — E1169 Type 2 diabetes mellitus with other specified complication: Secondary | ICD-10-CM

## 2022-04-27 DIAGNOSIS — I10 Essential (primary) hypertension: Secondary | ICD-10-CM

## 2022-04-27 DIAGNOSIS — E7800 Pure hypercholesterolemia, unspecified: Secondary | ICD-10-CM

## 2022-04-27 DIAGNOSIS — Z789 Other specified health status: Secondary | ICD-10-CM

## 2022-04-27 NOTE — Progress Notes (Signed)
Telephone visit note  Subjective:   Randy Liu, male    DOB: 1951/07/25, 71 y.o.   MRN: [id number]  I connected with the patient on 04/27/22 by a telephone call and verified that I am speaking with the correct person using two identifiers.     Virtual video visit could not be accomplished due to technical difficulties/lack of video enabled phone/computer. I discussed the limitations of evaluation and management by telemedicine and the availability of in person appointments. The patient and wife expressed understanding and agreed to proceed.   This visit type was conducted as the patient is predominantly bedbound and medical transportation to and from the medical office building is expensive for the patient and his family.  This format is felt to be most appropriate for this patient at this time.  All issues noted in this document were discussed and addressed.  No physical exam was performed. The patient has consented to conduct a Tele health visit and understands insurance will be billed.   Chief complaint:  CAD and heart failure follow-up  71 y.o. Caucasian male who is chronically bedbound, type 2 diabetes, gastroparesis, hyperlipidemia, statin intolerance, CKD stage IIIa, history of DVT on Eliquis, severe peripheral neuropathy, nonhealing GI ulcers status post duodenal polyp clipping, CAD status post PCI, chronic HFpEF, recovered cardiomyopathy, sleep apnea on CPAP.   In March 2023 he presented to [name] ED with ACS and underwent angiography and was noted to have multivessel CAD.  Due to the fact that he is predominantly bedbound he was noted to be a poor surgical candidate and underwent high risk PCI.  Post coronary intervention he was noted to have reduced LVEF which could be secondary to myocardial stunning versus low-flow phenomena during angiography.  He was discharged home once he was hemodynamically stable.  Due to soft blood pressures and chronic kidney disease have tried  to keep him euvolemic as possible with up titration of GDMT as his hemodynamics and laboratory values allow.  Unfortunately, patient came into the hospital August 2023 for chest pain.  His precordial discomfort was nonanginal presentation not typical of ACS.  He was noted to be volume overloaded and therefore underwent parenteral diuresis.  Echocardiogram during the last hospitalization noted improvement in LVEF.   He also had labs with his home visiting PCP on 04/22/2022.  Renal function coming back to baseline and NT proBNP is well controlled.  Clinically patient states that he is doing well.  Lipids have been removed and he is now having Ace wraps all the way up to his thighs bilaterally.  Past Medical History:  Diagnosis Date   Anal fissure    Asthma    Cardiomyopathy ([place])    CHF (congestive heart failure) (HCC)    Coronary artery disease    Diabetes mellitus without complication ([place])    History of DVT (deep vein thrombosis)    History of ulcer disease    Hyperlipidemia    Hypertension    Peripheral neuropathy    Sleep apnea     Past Surgical History:  Procedure Laterality Date   BIOPSY  12/14/2020   Procedure: BIOPSY;  Surgeon: Jackquline Denmark, MD;  Location: [facility];  Service: Endoscopy;;   BIOPSY  09/05/2021   Procedure: BIOPSY;  Surgeon: Irving Copas., MD;  Location: [facility];  Service: Gastroenterology;;   CHOLECYSTECTOMY N/A 12/17/2020   Procedure: LAPAROSCOPIC CHOLECYSTECTOMY, LIVER BIOPSY, PRIMARY UMBILICAL HERNIA REPAIR;  Surgeon: Armandina Gemma, MD;  Location: [facility];  Service: General;  Laterality: N/A;   COLONOSCOPY WITH PROPOFOL N/A 05/08/2021   Procedure: COLONOSCOPY WITH PROPOFOL;  Surgeon: Carol Ada, MD;  Location: [facility];  Service: Endoscopy;  Laterality: N/A;   CORONARY STENT INTERVENTION N/A 11/21/2021   Procedure: CORONARY STENT INTERVENTION;  Surgeon: Nigel Mormon, MD;  Location: [place] [facility];  Service: Cardiovascular;   Laterality: N/A;   ENDOSCOPIC RETROGRADE CHOLANGIOPANCREATOGRAPHY (ERCP) WITH PROPOFOL N/A 12/14/2020   Procedure: ENDOSCOPIC RETROGRADE CHOLANGIOPANCREATOGRAPHY (ERCP) WITH PROPOFOL;  Surgeon: Jackquline Denmark, MD;  Location: [facility];  Service: Endoscopy;  Laterality: N/A;   ESOPHAGOGASTRODUODENOSCOPY N/A 09/05/2021   Procedure: ESOPHAGOGASTRODUODENOSCOPY (EGD);  Surgeon: Irving Copas., MD;  Location: [facility];  Service: Gastroenterology;  Laterality: N/A;   HEMOSTASIS CLIP PLACEMENT  09/05/2021   Procedure: HEMOSTASIS CLIP PLACEMENT;  Surgeon: Irving Copas., MD;  Location: [facility];  Service: Gastroenterology;;   INTRAVASCULAR ULTRASOUND/IVUS N/A 11/21/2021   Procedure: Intravascular Ultrasound/IVUS;  Surgeon: Nigel Mormon, MD;  Location: [place] [facility];  Service: Cardiovascular;  Laterality: N/A;   LEFT HEART CATH AND CORONARY ANGIOGRAPHY N/A 08/12/2021   Procedure: LEFT HEART CATH AND CORONARY ANGIOGRAPHY;  Surgeon: Adrian Prows, MD;  Location: [place] [facility];  Service: Cardiovascular;  Laterality: N/A;   LEFT HEART CATH AND CORONARY ANGIOGRAPHY N/A 11/21/2021   Procedure: LEFT HEART CATH AND CORONARY ANGIOGRAPHY;  Surgeon: Nigel Mormon, MD;  Location: [place] [facility];  Service: Cardiovascular;  Laterality: N/A;   POLYPECTOMY  05/08/2021   Procedure: POLYPECTOMY;  Surgeon: Carol Ada, MD;  [facility];  Service: Endoscopy;;   POLYPECTOMY  09/05/2021   Procedure: POLYPECTOMY;  Surgeon: Irving Copas., MD;  Location: [facility] END[facility];  Service: Gastroenterology;;   REMOVAL OF STONES  12/14/2020   Procedure: REMOVAL OF STONES;  Surgeon: Jackquline Denmark, MD;  [facility]: [facility];  Service: Endoscopy;;   SPHINCTEROTOMY  12/14/2020   Procedure: Joan Mayans;  Surgeon: Jackquline Denmark, MD;  [facility]: [facility];  Service: Endoscopy;;    Social History   Socioeconomic History   Marital status: Married    Spouse name: Not on  file   Number of children: 0   Years of education: Not on file   Highest education level: Not on file  Occupational History   Not on file  Tobacco Use   Smoking status: Never   Smokeless tobacco: Never  Vaping Use   Vaping Use: Never used  Substance and Sexual Activity   Alcohol use: Not Currently   Drug use: Never   Sexual activity: Not on file  Other Topics Concern   Not on file  Social History Narrative   Not on file   Social Determinants of Health   Financial Resource Strain: Not on file  Food Insecurity: Not on file  Transportation Needs: Not on file  Physical Activity: Not on file  Stress: Not on file  Social Connections: Not on file  Intimate Partner Violence: Not on file    Family History  Problem Relation Age of Onset   Heart failure Mother    Heart disease Mother    Cancer Father     Current Outpatient Medications on File Prior to Visit  Medication Sig Dispense Refill   acetaminophen (TYLENOL) 500 MG tablet Take 1,000 mg by mouth every 8 (eight) hours as needed for moderate pain.     albuterol (VENTOLIN HFA) 108 (90 Base) MCG/ACT inhaler Inhale 2 puffs into the lungs every 6 (six) hours as needed for wheezing or shortness of breath.  Albuterol Sulfate (PROAIR RESPICLICK) 458 (90 Base) MCG/ACT AEPB Inhale 2 puffs into the lungs every 6 (six) hours as needed (wheezing, shortness of breath).     Alpha-Lipoic Acid 300 MG TABS Take 600 mg by mouth daily.     benzonatate (TESSALON) 200 MG capsule Take 200 mg by mouth every 8 (eight) hours as needed for cough.     Cholecalciferol 25 MCG (1000 UT) capsule Take 4,000 Units by mouth daily.     clopidogrel (PLAVIX) 75 MG tablet TAKE ONE (1) TABLET BY MOUTH EVERY DAY 30 tablet 0   colchicine-probenecid 0.5-500 MG tablet Take 1 tablet by mouth daily.     cyanocobalamin (,VITAMIN B-12,) 1000 MCG/ML injection Inject 1,000 mcg into the skin every 30 (thirty) days.     ELIQUIS 5 MG TABS tablet Take 1 tablet (5 mg total)  by mouth 2 (two) times daily. Resume on 05/11/21 (Patient taking differently: Take 5 mg by mouth 2 (two) times daily.) 60 tablet    Evolocumab (REPATHA SURECLICK) 099 MG/ML SOAJ Inject 1 mL into the skin every 14 (fourteen) days. 2 mL 3   famotidine-calcium carbonate-magnesium hydroxide (PEPCID COMPLETE) 10-800-165 MG chewable tablet Chew 1 tablet by mouth daily as needed (heartburn).     fenofibrate 160 MG tablet Take 160 mg by mouth daily.     fluticasone (FLONASE) 50 MCG/ACT nasal spray Place 1 spray into both nostrils as needed.     guaiFENesin-codeine 100-10 MG/5ML syrup Take 5 mLs by mouth 3 (three) times daily as needed for cough.     insulin aspart protamine- aspart (NOVOLOG MIX 70/30) (70-30) 100 UNIT/ML injection Inject 40-85 Units into the skin 2 (two) times daily. Sliding scale:  100-140 : 40 units  141-180 : 45 units  181-220 : 50 units  221-260 : 55 units  261-300 : 60 units  301-340 : 65 units  341-380 : 70 units  381-420 : 75 units  421-460 : 80 units  461-500 : 85 units - recheck in 1 hour and notify MD     latanoprost (XALATAN) 0.005 % ophthalmic solution Place 1 drop into the left eye at bedtime.     loratadine (CLARITIN) 10 MG tablet Take 10 mg by mouth daily.     magnesium oxide (MAG-OX) 400 MG tablet Take 1 tablet (400 mg total) by mouth 3 (three) times daily. 270 tablet 3   meclizine (ANTIVERT) 25 MG tablet Take 25 mg by mouth 3 (three) times daily as needed for nausea.     melatonin 5 MG TABS Take 5 mg by mouth at bedtime as needed (sleep).     metoCLOPramide (REGLAN) 5 MG tablet Take 2.5 mg by mouth at bedtime.     metoprolol succinate (TOPROL-XL) 25 MG 24 hr tablet TAKE 1/2 TABLET BY MOUTH DAILY (Patient taking differently: Take 12.5 mg by mouth daily.) 15 tablet 0   montelukast (SINGULAIR) 10 MG tablet Take 10 mg by mouth at bedtime.     nitroGLYCERIN (NITROSTAT) 0.4 MG SL tablet Place 0.4 mg under the tongue every 5 (five) minutes as needed for chest pain.      ondansetron (ZOFRAN-ODT) 4 MG disintegrating tablet Take 1 tablet (4 mg total) by mouth every 8 (eight) hours as needed for nausea or vomiting. 20 tablet 0   pantoprazole (PROTONIX) 40 MG tablet Take 1 tablet (40 mg total) by mouth 2 (two) times daily. 60 tablet 0   polyethylene glycol (MIRALAX / GLYCOLAX) 17 g packet Take 17 g by mouth  2 (two) times daily. (Patient taking differently: Take 17 g by mouth 2 (two) times daily as needed for mild constipation.) 14 each 0   Probiotic Product (ALIGN) 4 MG CAPS Take 4 mg by mouth daily.     sennosides-docusate sodium (SENOKOT-S) 8.6-50 MG tablet Take 1 tablet by mouth daily.     sodium chloride (MURO 128) 5 % ophthalmic ointment Place 1 application  into both eyes at bedtime as needed for eye irritation.     sucralfate (CARAFATE) 1 GM/10ML suspension Take 10 mLs (1 g total) by mouth 2 (two) times daily. 420 mL 0   torsemide (DEMADEX) 10 MG tablet Take 0.5 tablets (5 mg total) by mouth daily. 45 tablet 1   VASCEPA 1 g capsule TAKE ONE TABLET BY MOUTH TWICE DAILY 180 capsule 0   No current facility-administered medications on file prior to visit.    CARDIAC DATABASE:  Echocardiogram: 08/10/2021: LVEF 55 to 60%, trivial MR, estimated RAP 8 mmHg. 11/21/2021: LVEF 20-25%, severely reduced, global hypokinesis, RV systolic function mildly reduced, chronic pericardial fat pad.  11/25/2021: LVEF 40-45%. 04/16/2022: LVEF 50-55%, low normal ejection fraction, regional wall motion abnormalities (The entire apex is hypokinetic.), mild LVH, grade 1 diastolic impairment, no significant valvular heart disease.  Heart Catheterization: Coronary intervention 11/21/2021: LM: Normal LAD: Prox 100% occlusion         Diag 1 70% stenosis, followed by 100% occlusion Lcx: 99% stenosis in prox OM1 with minimal flow distally, which has progressed from previous angiogram in 07/2021        OM2 occluded RCA: Prox 50% stenosis (Present but under appreciated on previous angiogram  in 07/2021)         Mid 75% stenosis (Present but under appreciated on previous angiogram in 07/2021)         This lesion either was more severe and occlusive with wires and balloons or developed spasm or dissection to render it flow limiting during the procedure, thus requiring stenting         Distal RCA into RPDA with 95% stenosis         Ostial-prox RPLA 90% stenosis   Successful IVUS guided percutaneous coronary intervention      PTCA RPLA ostium with 2.0 X 8 mm balloon     PTA and stent placement 2.5 X 18 mm Onyx Frontier DES distal RCA/RPDA             Post dilatation with 2.5 X 8 mm [place] balloon at 18 atm     PTCA and stent placement 3.0 X 18 mm Onyx Frontier DES mid RCA             Post dilatation with 3.5 X 12 mm Justin balloon at 22 atm   Intraprocedural hypotension and shock requiring IV norepinephrine up to 30 mics, now completely weaned off Intraprocedural STE still persist, likely reflect intraprocedural no flow to RCA and to LAD via collaterals, likely causing myocardial stunning. Chest pain down from 7/10 to 1-2/10   He will need close monitoring of his chest pain, EKG, hemodynamics, urine output   Complex procedure due to intra procedural chest pain and ST elevation, diagnostic challenges responsible for slow flow, intra procedural hypotension requiring norepinephrine  External Labs: Collected: 12/02/2021 [facility] available in Care Everywhere. BNP 747. Sodium 136, potassium 5.1, chloride 102, bicarb 25 BUN 19, creatinine 1.3.  Albumin 3.1. AST 11, ALT 7, alkaline phosphatase 31  External Labs: Collected: 12/16/2021 Sodium  136, potassium 4.6, chloride 102, bicarb 23, BUN 25, creatinine 1.41 mg/dL. eGFR 53 mL/min per 1.73 m NT proBNP 4924 (no prior baseline).  Magnesium level 1.9  External Labs: Collected: 12/23/2021: Sodium 134, potassium 4.9, chloride 100, bicarb 23, BUN 33, creatinine 1.5, EGFR 49 B natruretic peptide  251  External Labs: Collected: 01/20/2022 available in Care Everywhere. A1c 7.2 Sodium 136, potassium 5.1, chloride 102, bicarb 25, BUN 35, creatinine 1.5, EGFR 49 Magnesium 2. NT proBNP 2246  External Labs: Collected: 02/04/2022 available in Care Everywhere. NT proBNP 1591. Sodium 139, potassium 4.5, chloride 104, bicarb 26, BUN 32, creatinine 1.54, EGFR 48  External Labs: Collected: 03/03/2022 provided by the patient's wife over the phone. Sodium 142, potassium 4.8, chloride 104, bicarb 23, BUN 33, creatinine 1.53, EGFR 48. AST 12, ALT 15, alkaline phosphatase 41 Total cholesterol 140, triglycerides 129, HDL 24, direct LDL 92,  NT proBNP 1646  External Labs: Collected: 04/22/2022 NT proBNP 1571. Magnesium 2.1 WBC 6.9, hemoglobin 11.3, hematocrit 33.8, platelets 230 BUN 25, creatinine 1.64 AST 15, ALT 20, alkaline phosphatase 35. eGFR 44. Sodium 138, potassium 4.4, chloride 101, bicarb 26   Review of Systems  Cardiovascular:  Negative for chest pain, dyspnea on exertion, leg swelling, orthopnea, palpitations, paroxysmal nocturnal dyspnea and syncope.  Respiratory:  Negative for cough, shortness of breath and wheezing.   Gastrointestinal:  Negative for nausea.     Vitals:   04/27/22 1605  BP: 112/62  Pulse: 74  Resp: 16  Temp: 97.8 F (36.6 C)  SpO2: 96%   (Measured by the patient using a home BP monitor)  Physical Exam: Not performed, as this is a telephone visit  Randy Liu is a 71 y.o. male whose past medical history and cardiac risk factors include: who is chronically bedbound, type 2 diabetes, gastroparesis, hyperlipidemia, statin intolerance, CKD stage IIIa, history of DVT on Eliquis, severe peripheral neuropathy, nonhealing GI ulcers status post duodenal polyp clipping, CAD status post PCI, chronic HFpEF, recovered cardiomyopathy, sleep apnea on CPAP.     ICD-10-CM   1. Chronic heart failure with preserved ejection fraction (HFpEF) (HCC)  I50.32     2.  Hx of non-ST elevation myocardial infarction (NSTEMI)  I25.2     3. Coronary artery disease involving native coronary artery of native heart without angina pectoris  I25.10     4. Essential hypertension  I10     5. Hypercholesteremia  E78.00     6. Statin intolerance  Z78.9     7. Type 2 diabetes mellitus with hyperlipidemia (HCC)  E11.69    E78.5     8. History of deep venous thrombosis  Z86.718     9. Ambulatory dysfunction  R26.2       Recommendations:  Since patient is predominantly bedbound and transportation to and from the office is quite expensive as noted above the shared decision was to follow-up via telehealth visit.  The visiting physician aids in  physical examination findings along with home health care nurse.  Since last office visit in June 2023 he was hospitalized for chest pain which appeared to be noncardiac.  He had an echocardiogram which noted improvement in LVEF compared to prior studies from March 2023.  Due to volume overload he was treated for HFpEF with parenteral diuretics and later discharged home.  Due to acute kidney injury Delene Loll was held at the time of discharge.  Since discharge patient has been doing well as per the patient's wife and visiting physician.  He  had labs on 04/22/2022 which notes relatively stable and NT proBNP and serum creatinine levels are improving compared to discharge.  Unna boots have been discontinued and currently is having Ace bandages wrapped all day up to the thighs to help mobilize the fluids.  Also recommended providing protein supplementation with meals to help improve oncotic pressure which will help reduce third spacing.  Currently on torsemide 5 mg p.o. daily.  Discharge summary reviewed, spoke to visiting provider Dr. Tommi Rumps last week regards to recommendations, reviewed outside labs independently which are noted above for further reference.  We will continue with the office visit which is tentatively scheduled in  September 2023.  Labs prior to then to check NT proBNP, BMP, and magnesium level.  No orders of the defined types were placed in this encounter.  --Continue cardiac medications as reconciled in final medication list. --Return in about 6 weeks (around 06/05/2022) for Follow up, heart failure management., CAD. Or sooner if needed. --Continue follow-up with your primary care physician regarding the management of your other chronic comorbid conditions.  Patient's questions and concerns were addressed to his satisfaction. He voices understanding of the instructions provided during this encounter.   This note was created using a voice recognition software as a result there may be grammatical errors inadvertently enclosed that do not reflect the nature of this encounter. Every attempt is made to correct such errors.  Total telephone encounter time: 30 minutes  Mechele Claude [facility]  Pager: [phone] Office: [phone]

## 2022-04-29 ENCOUNTER — Other Ambulatory Visit: Payer: Self-pay

## 2022-04-29 MED ORDER — REPATHA SURECLICK 140 MG/ML ~~LOC~~ SOAJ: 1.0000 mL | SUBCUTANEOUS | 3 refills | Status: AC

## 2022-05-07 ENCOUNTER — Other Ambulatory Visit: Payer: Self-pay | Admitting: Cardiology

## 2022-05-21 ENCOUNTER — Other Ambulatory Visit: Payer: Self-pay | Admitting: Cardiology

## 2022-05-25 IMAGING — US US ABDOMEN LIMITED RUQ/ASCITES
1 series · 14 of 25 positions shown · non-contrast
Comparison: CT from the previous day.

CLINICAL DATA: Elevated LFTs

EXAM:
ULTRASOUND ABDOMEN LIMITED RIGHT UPPER QUADRANT

[Series 1: us abdomen limited ruq/ascites · 14 of 61 slices shown]
[im 1/61]
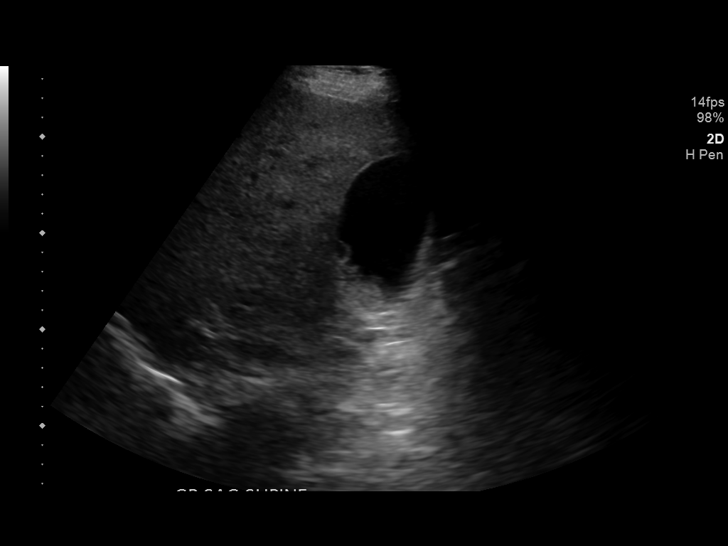
[im 6/61]
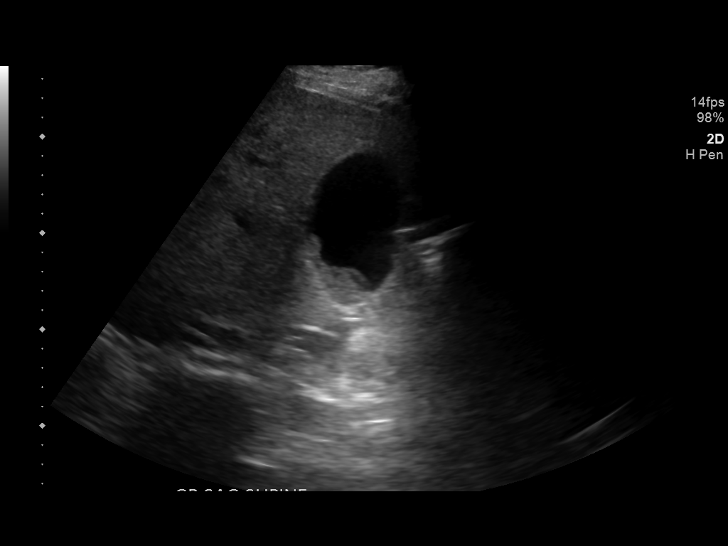
[im 11/61]
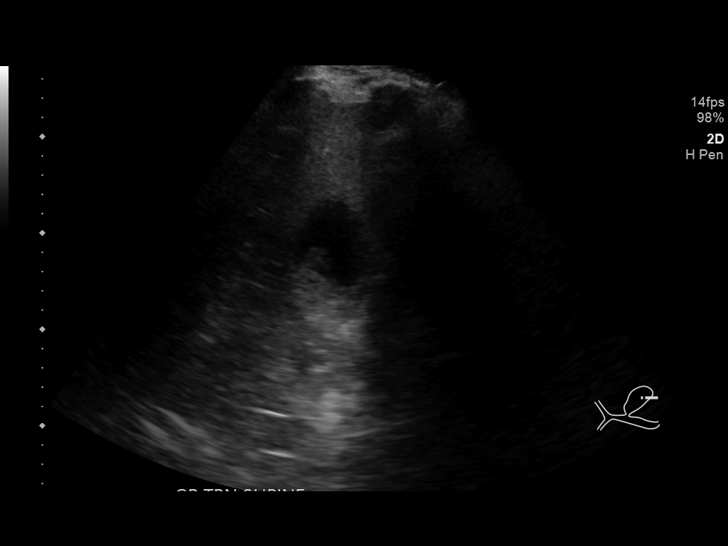
[im 16/61]
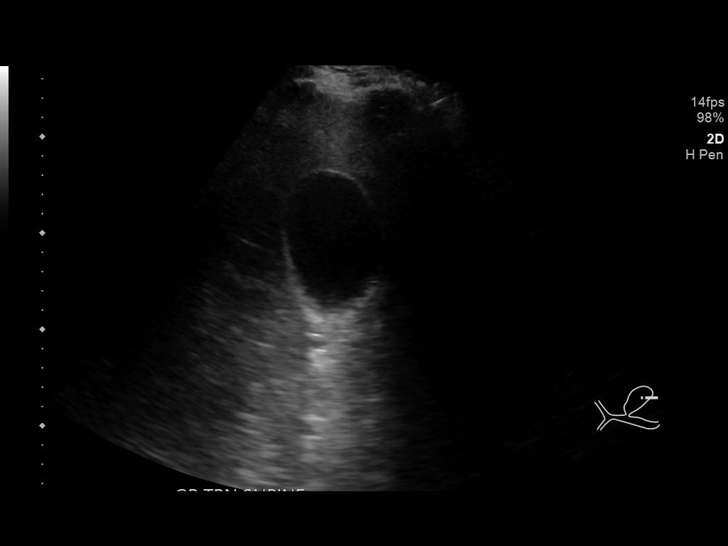
[im 21/61]
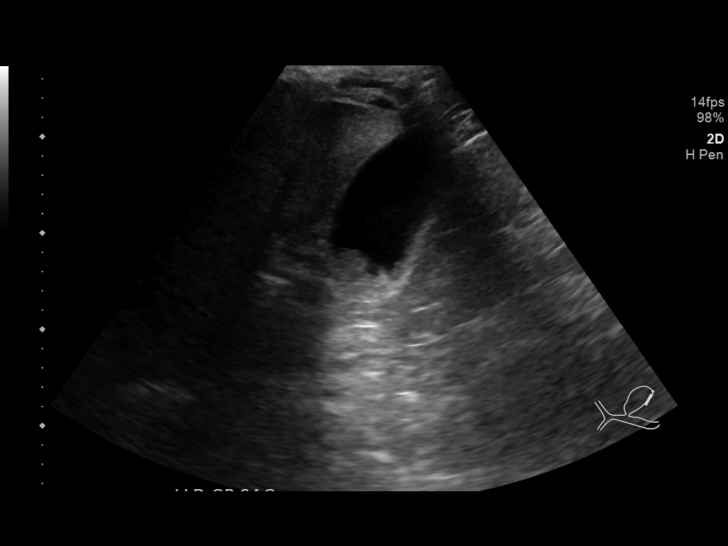
[im 23/61]
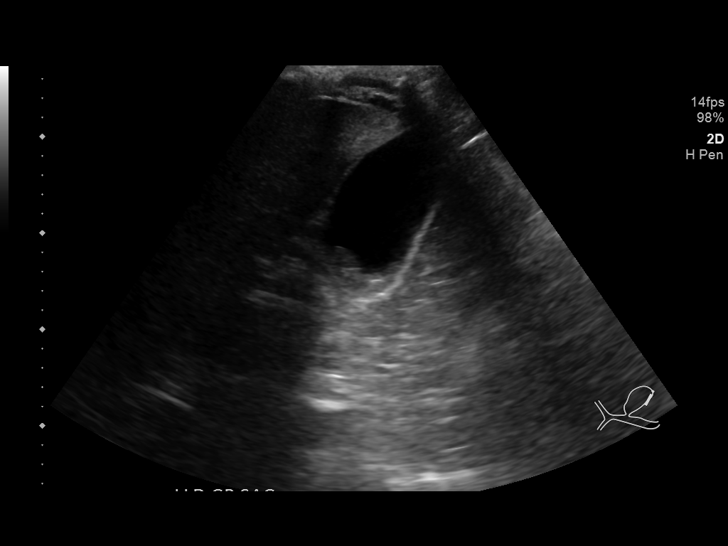
[im 28/61]
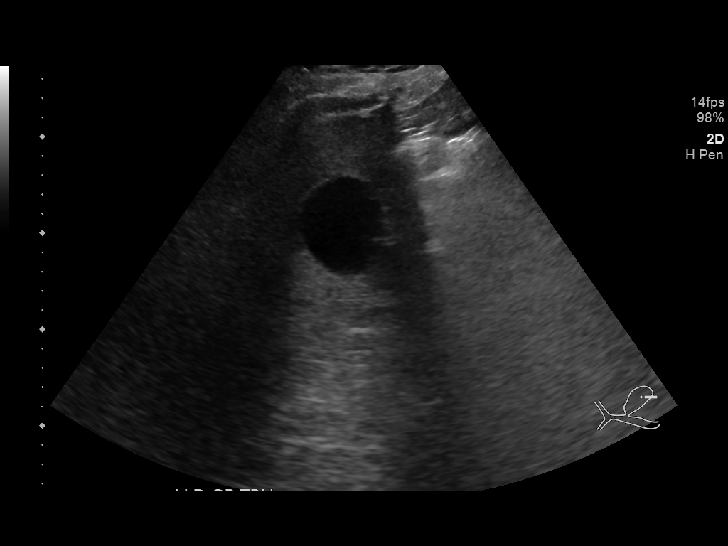
[im 33/61]
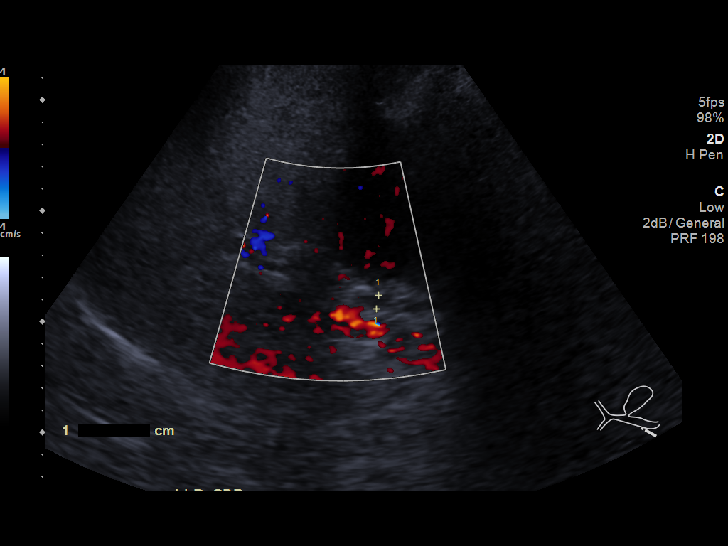
[im 38/61]
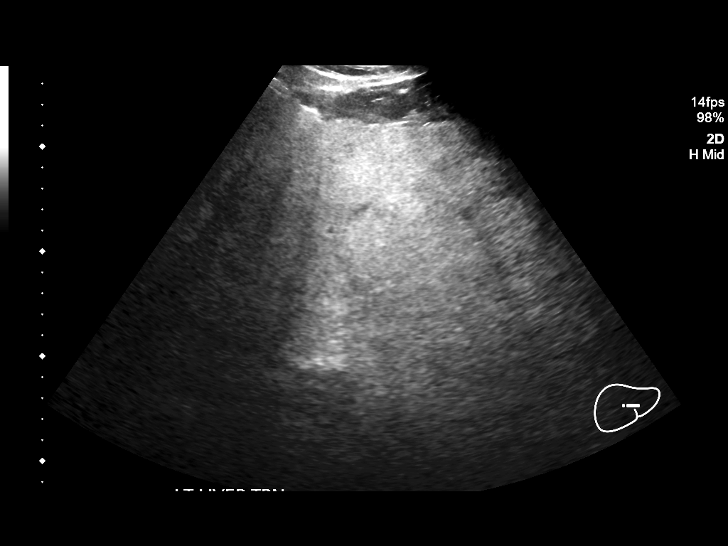
[im 41/61]
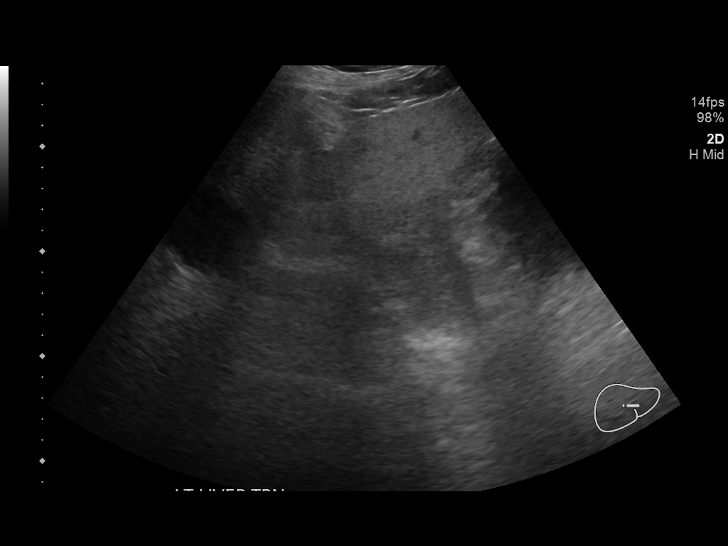
[im 46/61]
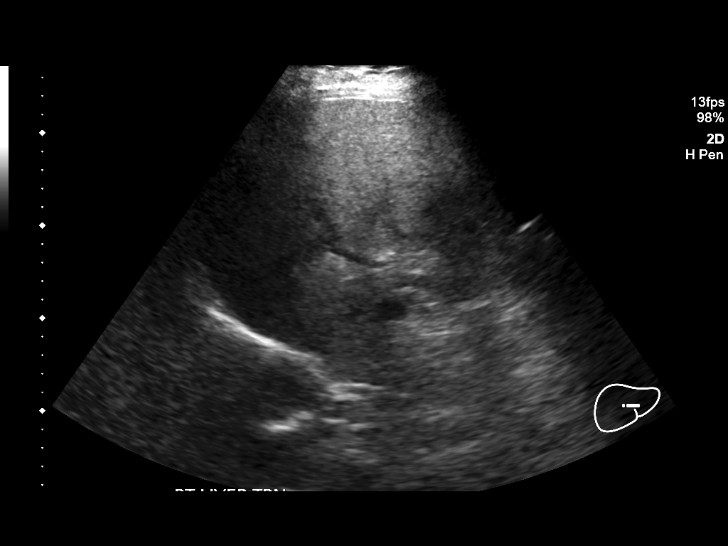
[im 51/61]
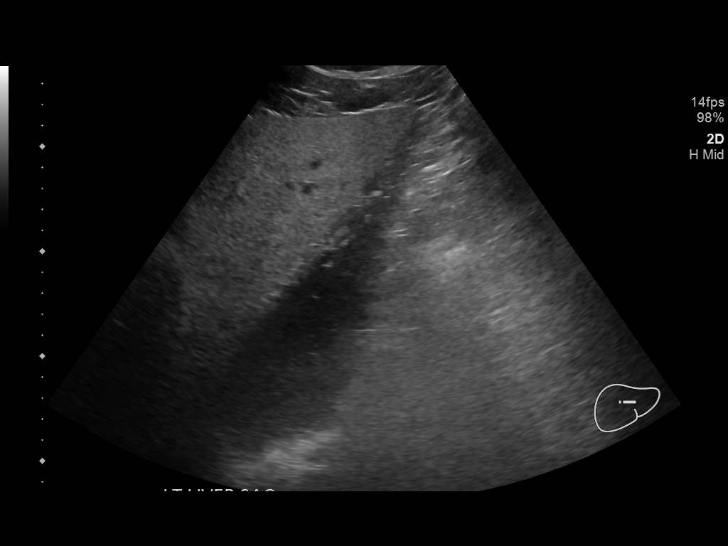
[im 56/61]
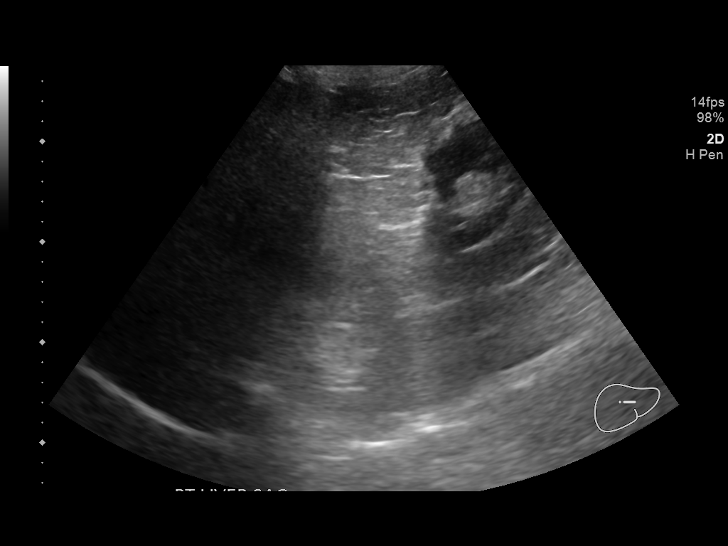
[im 61/61]
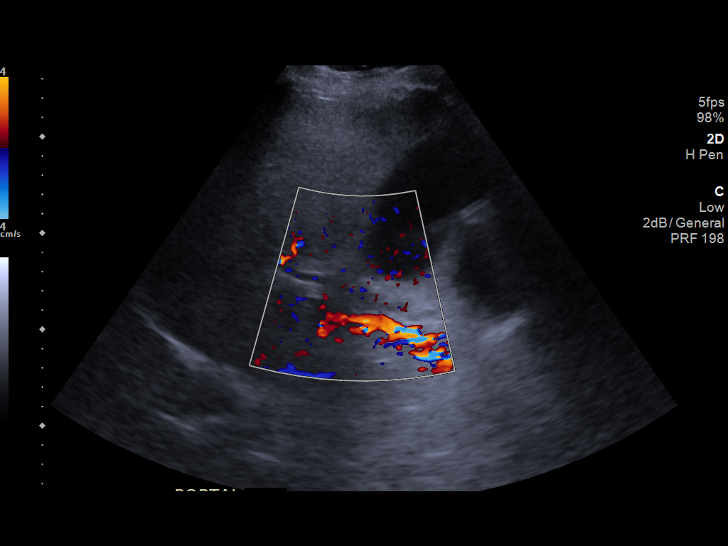

[14 of 25 positions shown; findings below may reference images not displayed]

FINDINGS: Gallbladder:

Gallbladder is well distended with sludge within. No cholelithiasis
is noted. No wall thickening or pericholecystic fluid is noted.
Negative sonographic Murphy's sign is elicited.

Common bile duct:

Diameter: 6.5 mm. This is within normal limits for the patient's
age.

Liver:

Mild increased echogenicity is noted consistent with fatty
infiltration. No focal mass is noted. Portal vein is patent on color
Doppler imaging with normal direction of blood flow towards the
liver.

Other: None.
IMPRESSION: Gallbladder sludge without complicating factors.

Fatty infiltration of the liver.

## 2022-06-05 ENCOUNTER — Encounter: Payer: Self-pay | Admitting: Cardiology

## 2022-06-05 ENCOUNTER — Ambulatory Visit: Payer: Medicare Other | Admitting: Cardiology

## 2022-06-05 VITALS — BP 118/65 | HR 65 | Resp 16 | Ht 73.0 in

## 2022-06-05 DIAGNOSIS — Z789 Other specified health status: Secondary | ICD-10-CM

## 2022-06-05 DIAGNOSIS — I5032 Chronic diastolic (congestive) heart failure: Secondary | ICD-10-CM

## 2022-06-05 DIAGNOSIS — E1169 Type 2 diabetes mellitus with other specified complication: Secondary | ICD-10-CM

## 2022-06-05 DIAGNOSIS — I2510 Atherosclerotic heart disease of native coronary artery without angina pectoris: Secondary | ICD-10-CM

## 2022-06-05 DIAGNOSIS — R262 Difficulty in walking, not elsewhere classified: Secondary | ICD-10-CM

## 2022-06-05 DIAGNOSIS — I252 Old myocardial infarction: Secondary | ICD-10-CM

## 2022-06-05 DIAGNOSIS — E7800 Pure hypercholesterolemia, unspecified: Secondary | ICD-10-CM

## 2022-06-05 DIAGNOSIS — I10 Essential (primary) hypertension: Secondary | ICD-10-CM

## 2022-06-05 DIAGNOSIS — Z86718 Personal history of other venous thrombosis and embolism: Secondary | ICD-10-CM

## 2022-06-05 MED ORDER — ENTRESTO 24-26 MG PO TABS: 1.0000 | ORAL_TABLET | Freq: Two times a day (BID) | ORAL | 0 refills | Status: AC

## 2022-06-05 NOTE — Progress Notes (Signed)
Telephone visit note  Subjective:   Randy Liu, male    DOB: 06-06-1951, 71 y.o.   MRN: [id number]  I connected with the patient on 06/05/22 by a telephone call and verified that I am speaking with the correct person using two identifiers.     Virtual video visit could not be accomplished due to technical difficulties/lack of video enabled phone/computer. I discussed the limitations of evaluation and management by telemedicine and the availability of in person appointments. The patient and wife expressed understanding and agreed to proceed.   This visit type was conducted as the patient is predominantly bedbound and medical transportation to and from the medical office building is expensive for the patient and his family.  This format is felt to be most appropriate for this patient at this time.  All issues noted in this document were discussed and addressed.  No physical exam was performed. The patient has consented to conduct a Tele health visit and understands insurance will be billed.   Chief complaint:  CAD and heart failure follow-up  71 y.o. Caucasian male who is chronically bedbound (due to neuropathy), type 2 diabetes, gastroparesis, hyperlipidemia, statin intolerance, CKD stage III, history of DVT on Eliquis, severe peripheral neuropathy, nonhealing GI ulcers status post duodenal polyp clipping, CAD status post PCI, chronic HFpEF, recovered cardiomyopathy, sleep apnea on CPAP.   In March 2023 he presented to [name], ED with ACS and underwent angiography and was noted to have multivessel CAD.  Due to the fact that he is predominantly bedbound he was noted to be a poor surgical candidate and underwent high risk PCI.  Post coronary intervention he was noted to have reduced LVEF which could be secondary to myocardial stunning versus low-flow phenomena during angiography.  He was discharged home once he was hemodynamically stable.  Thereafter he was uptitrated on GDMT to  maximally tolerated doses; however, limited due to soft blood pressures and renal function.  He was admitted to the hospital in August 2023 for chest pain but the discomfort was not an anginal equivalent.  He was volume overloaded and therefore underwent parenteral diuresis and the echo performed during that hospitalization noted improvement in LVEF.  Last telephone encounter was approximately 1 month ago.  Since then he is doing well.  Because he is bedbound unable to check his weight at home.  No anginal discomfort.  However, a week ago he did have stomach and chest pain after vomiting.  The discomfort was associated with heartburn and therefore his Reglan was increased by his PCP.  His home SBP ranges between 103-130 mmHg per wife and DBP ranges between 60-72 mmHg.  Outside labs from 06/01/2022 independently reviewed.  [name] places Ace bandage wraps on both legs for couple days a week which helps mobilize the fluid and keep the patient comfortable.  Past Medical History:  Diagnosis Date   Anal fissure    Asthma    Cardiomyopathy ([place])    CHF (congestive heart failure) ([place])    Coronary artery disease    Diabetes mellitus without complication ([place])    History of DVT (deep vein thrombosis)    History of ulcer disease    Hyperlipidemia    Hypertension    Peripheral neuropathy    Sleep apnea     Past Surgical History:  Procedure Laterality Date   BIOPSY  12/14/2020   Procedure: BIOPSY;  Surgeon: Jackquline Denmark, MD;  Location: [facility];  Service: Endoscopy;;   BIOPSY  09/05/2021  Procedure: BIOPSY;  Surgeon: Irving Copas., MD;  Location: [facility];  Service: Gastroenterology;;   CHOLECYSTECTOMY N/A 12/17/2020   Procedure: LAPAROSCOPIC CHOLECYSTECTOMY, LIVER BIOPSY, PRIMARY UMBILICAL HERNIA REPAIR;  Surgeon: Armandina Gemma, MD;  Location: [facility];  Service: General;  Laterality: N/A;   COLONOSCOPY WITH PROPOFOL N/A 05/08/2021   Procedure: COLONOSCOPY WITH PROPOFOL;   Surgeon: Carol Ada, MD;  Location: [facility];  Service: Endoscopy;  Laterality: N/A;   CORONARY STENT INTERVENTION N/A 11/21/2021   Procedure: CORONARY STENT INTERVENTION;  Surgeon: Nigel Mormon, MD;  Location: [place] [facility];  Service: Cardiovascular;  Laterality: N/A;   ENDOSCOPIC RETROGRADE CHOLANGIOPANCREATOGRAPHY (ERCP) WITH PROPOFOL N/A 12/14/2020   Procedure: ENDOSCOPIC RETROGRADE CHOLANGIOPANCREATOGRAPHY (ERCP) WITH PROPOFOL;  Surgeon: Jackquline Denmark, MD;  Location: [facility];  Service: Endoscopy;  Laterality: N/A;   ESOPHAGOGASTRODUODENOSCOPY N/A 09/05/2021   Procedure: ESOPHAGOGASTRODUODENOSCOPY (EGD);  Surgeon: Irving Copas., MD;  Location: [facility];  Service: Gastroenterology;  Laterality: N/A;   HEMOSTASIS CLIP PLACEMENT  09/05/2021   Procedure: HEMOSTASIS CLIP PLACEMENT;  Surgeon: Irving Copas., MD;  Location: [place];  Service: Gastroenterology;;   INTRAVASCULAR ULTRASOUND/IVUS N/A 11/21/2021   Procedure: Intravascular Ultrasound/IVUS;  Surgeon: Nigel Mormon, MD;  Location: [place] [facility];  Service: Cardiovascular;  Laterality: N/A;   LEFT HEART CATH AND CORONARY ANGIOGRAPHY N/A 08/12/2021   Procedure: LEFT HEART CATH AND CORONARY ANGIOGRAPHY;  Surgeon: Adrian Prows, MD;  Location: [place] [facility];  Service: Cardiovascular;  Laterality: N/A;   LEFT HEART CATH AND CORONARY ANGIOGRAPHY N/A 11/21/2021   Procedure: LEFT HEART CATH AND CORONARY ANGIOGRAPHY;  Surgeon: Nigel Mormon, MD;  Location: [place] [facility];  Service: Cardiovascular;  Laterality: N/A;   POLYPECTOMY  05/08/2021   Procedure: POLYPECTOMY;  Surgeon: Carol Ada, MD;  [facility];  Service: Endoscopy;;   POLYPECTOMY  09/05/2021   Procedure: POLYPECTOMY;  Surgeon: Irving Copas., MD;  Location: [facility];  Service: Gastroenterology;;   REMOVAL OF STONES  12/14/2020   Procedure: REMOVAL OF STONES;  Surgeon: Jackquline Denmark, MD;   [facility];  Service: Endoscopy;;   SPHINCTEROTOMY  12/14/2020   Procedure: Joan Mayans;  Surgeon: Jackquline Denmark, MD;  Location: [facility];  Service: Endoscopy;;    Social History   Socioeconomic History   Marital status: Married    Spouse name: Not on file   Number of children: 0   Years of education: Not on file   Highest education level: Not on file  Occupational History   Not on file  Tobacco Use   Smoking status: Never   Smokeless tobacco: Never  Vaping Use   Vaping Use: Never used  Substance and Sexual Activity   Alcohol use: Not Currently   Drug use: Never   Sexual activity: Not on file  Other Topics Concern   Not on file  Social History Narrative   Not on file   Social Determinants of Health   Financial Resource Strain: Not on file  Food Insecurity: Not on file  Transportation Needs: Not on file  Physical Activity: Not on file  Stress: Not on file  Social Connections: Not on file  Intimate Partner Violence: Not on file    Family History  Problem Relation Age of Onset   Heart failure Mother    Heart disease Mother    Cancer Father     Current Outpatient Medications on File Prior to Visit  Medication Sig Dispense Refill   acetaminophen (TYLENOL) 500 MG tablet  Take 1,000 mg by mouth every 8 (eight) hours as needed for moderate pain.     albuterol (VENTOLIN HFA) 108 (90 Base) MCG/ACT inhaler Inhale 2 puffs into the lungs every 6 (six) hours as needed for wheezing or shortness of breath.     Albuterol Sulfate (PROAIR RESPICLICK) 960 (90 Base) MCG/ACT AEPB Inhale 2 puffs into the lungs every 6 (six) hours as needed (wheezing, shortness of breath).     Alpha-Lipoic Acid 300 MG TABS Take 600 mg by mouth daily.     benzonatate (TESSALON) 200 MG capsule Take 200 mg by mouth every 8 (eight) hours as needed for cough.     Cholecalciferol 25 MCG (1000 UT) capsule Take 4,000 Units by mouth daily.     clopidogrel (PLAVIX) 75 MG tablet TAKE ONE (1)  TABLET BY MOUTH EVERY DAY 30 tablet 0   colchicine-probenecid 0.5-500 MG tablet Take 1 tablet by mouth daily.     cyanocobalamin (,VITAMIN B-12,) 1000 MCG/ML injection Inject 1,000 mcg into the skin every 30 (thirty) days.     ELIQUIS 5 MG TABS tablet Take 1 tablet (5 mg total) by mouth 2 (two) times daily. Resume on 05/11/21 (Patient taking differently: Take 5 mg by mouth 2 (two) times daily.) 60 tablet    Evolocumab (REPATHA SURECLICK) 454 MG/ML SOAJ Inject 1 mL into the skin every 14 (fourteen) days. 2 mL 3   famotidine-calcium carbonate-magnesium hydroxide (PEPCID COMPLETE) 10-800-165 MG chewable tablet Chew 1 tablet by mouth daily as needed (heartburn).     fenofibrate 160 MG tablet Take 160 mg by mouth daily.     fluticasone (FLONASE) 50 MCG/ACT nasal spray Place 1 spray into both nostrils as needed.     guaiFENesin-codeine 100-10 MG/5ML syrup Take 5 mLs by mouth 3 (three) times daily as needed for cough.     insulin aspart protamine- aspart (NOVOLOG MIX 70/30) (70-30) 100 UNIT/ML injection Inject 40-85 Units into the skin 2 (two) times daily. Sliding scale:  100-140 : 40 units  141-180 : 45 units  181-220 : 50 units  221-260 : 55 units  261-300 : 60 units  301-340 : 65 units  341-380 : 70 units  381-420 : 75 units  421-460 : 80 units  461-500 : 85 units - recheck in 1 hour and notify MD     latanoprost (XALATAN) 0.005 % ophthalmic solution Place 1 drop into the left eye at bedtime.     loratadine (CLARITIN) 10 MG tablet Take 10 mg by mouth daily.     magnesium oxide (MAG-OX) 400 MG tablet Take 1 tablet (400 mg total) by mouth 3 (three) times daily. 270 tablet 3   meclizine (ANTIVERT) 25 MG tablet Take 25 mg by mouth 3 (three) times daily as needed for nausea.     melatonin 5 MG TABS Take 5 mg by mouth at bedtime as needed (sleep).     metoCLOPramide (REGLAN) 5 MG tablet Take 5 mg by mouth at bedtime.     metoprolol succinate (TOPROL-XL) 25 MG 24 hr tablet Take 0.5 tablets (12.5 mg  total) by mouth daily. 45 tablet 3   montelukast (SINGULAIR) 10 MG tablet Take 10 mg by mouth at bedtime.     nitroGLYCERIN (NITROSTAT) 0.4 MG SL tablet Place 0.4 mg under the tongue every 5 (five) minutes as needed for chest pain.     ondansetron (ZOFRAN-ODT) 4 MG disintegrating tablet Take 1 tablet (4 mg total) by mouth every 8 (eight) hours as needed for nausea  or vomiting. 20 tablet 0   pantoprazole (PROTONIX) 40 MG tablet Take 1 tablet (40 mg total) by mouth 2 (two) times daily. 60 tablet 0   polyethylene glycol (MIRALAX / GLYCOLAX) 17 g packet Take 17 g by mouth 2 (two) times daily. (Patient taking differently: Take 17 g by mouth 2 (two) times daily as needed for mild constipation.) 14 each 0   Probiotic Product (ALIGN) 4 MG CAPS Take 4 mg by mouth daily.     sennosides-docusate sodium (SENOKOT-S) 8.6-50 MG tablet Take 1 tablet by mouth daily.     sodium chloride (MURO 128) 5 % ophthalmic ointment Place 1 application  into both eyes at bedtime as needed for eye irritation.     sucralfate (CARAFATE) 1 GM/10ML suspension Take 10 mLs (1 g total) by mouth 2 (two) times daily. 420 mL 0   torsemide (DEMADEX) 10 MG tablet Take 0.5 tablets (5 mg total) by mouth daily. 45 tablet 1   VASCEPA 1 g capsule TAKE ONE TABLET BY MOUTH TWICE DAILY 180 capsule 0   No current facility-administered medications on file prior to visit.    CARDIAC DATABASE:  Echocardiogram: 08/10/2021: LVEF 55 to 60%, trivial MR, estimated RAP 8 mmHg. 11/21/2021: LVEF 20-25%, severely reduced, global hypokinesis, RV systolic function mildly reduced, chronic pericardial fat pad.  11/25/2021: LVEF 40-45%. 04/16/2022: LVEF 50-55%, low normal ejection fraction, regional wall motion abnormalities (The entire apex is hypokinetic.), mild LVH, grade 1 diastolic impairment, no significant valvular heart disease.  Heart Catheterization: Coronary intervention 11/21/2021: LM: Normal LAD: Prox 100% occlusion         Diag 1 70% stenosis,  followed by 100% occlusion Lcx: 99% stenosis in prox OM1 with minimal flow distally, which has progressed from previous angiogram in 07/2021        OM2 occluded RCA: Prox 50% stenosis (Present but under appreciated on previous angiogram in 07/2021)         Mid 75% stenosis (Present but under appreciated on previous angiogram in 07/2021)         This lesion either was more severe and occlusive with wires and balloons or developed spasm or dissection to render it flow limiting during the procedure, thus requiring stenting         Distal RCA into RPDA with 95% stenosis         Ostial-prox RPLA 90% stenosis   Successful IVUS guided percutaneous coronary intervention      PTCA RPLA ostium with 2.0 X 8 mm balloon     PTA and stent placement 2.5 X 18 mm Onyx Frontier DES distal RCA/RPDA             Post dilatation with 2.5 X 8 mm Melvin balloon at 18 atm     PTCA and stent placement 3.0 X 18 mm Onyx Frontier DES mid RCA             Post dilatation with 3.5 X 12 mm [place] balloon at 22 atm   Intraprocedural hypotension and shock requiring IV norepinephrine up to 30 mics, now completely weaned off Intraprocedural STE still persist, likely reflect intraprocedural no flow to RCA and to LAD via collaterals, likely causing myocardial stunning. Chest pain down from 7/10 to 1-2/10   He will need close monitoring of his chest pain, EKG, hemodynamics, urine output   Complex procedure due to intra procedural chest pain and ST elevation, diagnostic challenges responsible for slow flow, intra procedural hypotension requiring norepinephrine  External Labs: Collected:  12/02/2021 [facility] available in Care Everywhere. BNP 747. Sodium 136, potassium 5.1, chloride 102, bicarb 25 BUN 19, creatinine 1.3.  Albumin 3.1. AST 11, ALT 7, alkaline phosphatase 31  External Labs: Collected: 12/16/2021 Sodium 136, potassium 4.6, chloride 102, bicarb 23, BUN 25, creatinine 1.41 mg/dL. eGFR  53 mL/min per 1.73 m NT proBNP 4924 (no prior baseline).  Magnesium level 1.9  External Labs: Collected: 12/23/2021: Sodium 134, potassium 4.9, chloride 100, bicarb 23, BUN 33, creatinine 1.5, EGFR 49 B natruretic peptide 251  External Labs: Collected: 01/20/2022 available in Care Everywhere. A1c 7.2 Sodium 136, potassium 5.1, chloride 102, bicarb 25, BUN 35, creatinine 1.5, EGFR 49 Magnesium 2. NT proBNP 2246  External Labs: Collected: 02/04/2022 available in Care Everywhere. NT proBNP 1591. Sodium 139, potassium 4.5, chloride 104, bicarb 26, BUN 32, creatinine 1.54, EGFR 48  External Labs: Collected: 03/03/2022 provided by the patient's wife over the phone. Sodium 142, potassium 4.8, chloride 104, bicarb 23, BUN 33, creatinine 1.53, EGFR 48. AST 12, ALT 15, alkaline phosphatase 41 Total cholesterol 140, triglycerides 129, HDL 24, direct LDL 92,  NT proBNP 1646  External Labs: Collected: 04/22/2022 NT proBNP 1571. Magnesium 2.1 WBC 6.9, hemoglobin 11.3, hematocrit 33.8, platelets 230 BUN 25, creatinine 1.64 AST 15, ALT 20, alkaline phosphatase 35. eGFR 44. Sodium 138, potassium 4.4, chloride 101, bicarb 26   External Labs: Collected: 06/01/2022 [facility]. Hemoglobin 11.9 g/dL, hematocrit 35.8%. NT proBNP 1971. A1c 6.9 Sodium 138, potassium 4.4, chloride 102, bicarb 27, BUN 28, creatinine 1.6 (baseline creatinine 1.5) AST 22, ALT 26, alkaline phosphatase 39. eGFR 45  Lab Results  Component Value Date   CHOL 106 04/17/2022   HDL 26 (L) 04/17/2022   LDLCALC 52 04/17/2022   LDLDIRECT 59 04/17/2022   TRIG 140 04/17/2022   CHOLHDL 4.1 04/17/2022     Review of Systems  Cardiovascular:  Negative for chest pain, dyspnea on exertion, leg swelling, orthopnea, palpitations, paroxysmal nocturnal dyspnea and syncope.  Respiratory:  Negative for cough, shortness of breath and wheezing.   Gastrointestinal:  Negative for nausea.     Vitals:   06/05/22  0919  BP: 118/65  Pulse: 65  Resp: 16  SpO2: 94%   (Measured by the patient using a home BP monitor)  Physical Exam: Not performed, as this is a telephone visit  Randy Liu is a 71 y.o. male whose past medical history and cardiac risk factors include: who is chronically bedbound, type 2 diabetes, gastroparesis, hyperlipidemia, statin intolerance, CKD stage IIIa, history of DVT on Eliquis, severe peripheral neuropathy, nonhealing GI ulcers status post duodenal polyp clipping, CAD status post PCI, chronic HFpEF, recovered cardiomyopathy, sleep apnea on CPAP.     ICD-10-CM   1. Chronic heart failure with preserved ejection fraction (HFpEF) (HCC)  I50.32 sacubitril-valsartan (ENTRESTO) 24-26 MG    Pro b natriuretic peptide (BNP)    Basic metabolic panel    Magnesium    2. Coronary artery disease involving native coronary artery of native heart without angina pectoris  I25.10     3. Hx of non-ST elevation myocardial infarction (NSTEMI)  I25.2     4. Essential hypertension  I10     5. Hypercholesteremia  E78.00     6. Statin intolerance  Z78.9     7. Type 2 diabetes mellitus with hyperlipidemia (HCC)  E11.69    E78.5     8. History of deep venous thrombosis  Z86.718  9. Ambulatory dysfunction  R26.2       Recommendations:  Chronic heart failure with preserved ejection fraction (HFpEF) (HCC) Stage C. Unable to quantify NYHA class as he is predominantly bedbound. Unable to check weights at home for the same reasons as mentioned above. Home blood pressures are within acceptable range. Outside labs from 06/01/2022 illustrates stable renal function and NT proBNP slightly greater than baseline. Patient is diuresing well on the current dose of torsemide. Shared decision was to restart Entresto 24/26 mg p.o. twice daily. Recommend rechecking a BMP, NT proBNP, and magnesium in 2 weeks.  Patient has a visiting primary care provider who will check these labs.  Coronary  artery disease involving native coronary artery of native heart without angina pectoris / Hx of non-ST elevation myocardial infarction (NSTEMI) Denies angina pectoris. Still has episodes of heartburn likely secondary to gastroparesis and abdominal pain. Continue Plavix given his recent coronary intervention. Reemphasized importance of improving his modifiable cardiovascular risk factors as tolerated.  Essential hypertension Home blood pressures are well controlled. Continue current medical therapy.  Hypercholesteremia / Statin intolerance Currently on Repatha, Vascepa, fenofibrate. Last lipid profile from 04/2022 independently reviewed and noted above.  Type 2 diabetes mellitus with hyperlipidemia ([place]) Reemphasized importance of glycemic control  History of deep venous thrombosis Currently on Eliquis. Currently managed by primary care provider.  Orders Placed This Encounter  Procedures   Pro b natriuretic peptide (BNP)   Basic metabolic panel   Magnesium    --Continue cardiac medications as reconciled in final medication list. --Return in about 3 months (around 09/04/2022) for Follow up, heart failure management.. Or sooner if needed. --Continue follow-up with your primary care physician regarding the management of your other chronic comorbid conditions.  Patient's questions and concerns were addressed to his satisfaction. He voices understanding of the instructions provided during this encounter.   This note was created using a voice recognition software as a result there may be grammatical errors inadvertently enclosed that do not reflect the nature of this encounter. Every attempt is made to correct such errors.  Total telephone encounter time: 32 minutes  Mechele Claude [facility]  Pager: [phone] Office: [phone]

## 2022-06-24 ENCOUNTER — Other Ambulatory Visit: Payer: Self-pay | Admitting: Cardiology

## 2022-06-24 ENCOUNTER — Telehealth: Payer: Self-pay

## 2022-06-24 NOTE — Telephone Encounter (Signed)
Ask to make sure he is not on potassium supplements or diet rich in potassium food.  Hold Entresto for now and repeat BMP in one week.  Due to his hx of bowel obstruction he is not an ideal candidate for [facility] or [place].  His baseline Cr is around 1.6 so 1.7 after starting entersto is acceptable.   Dr. Terri Skains

## 2022-06-24 NOTE — Telephone Encounter (Signed)
Called and spoke to patient's wife she stated Randy Liu isn't taking no potassium supplements and rarely consumes any food that contains potassium he can sometimes have cereal with a banana but that has been for a couple days he has had that. I made her aware of holding off on Entresto and recheck labs in one week she voiced understanding. I spoke to Randy Liu as well she stated she can go out again to obtain those labs either Monday or Tuesday.

## 2022-06-24 NOTE — Telephone Encounter (Signed)
Thank you :)

## 2022-07-03 ENCOUNTER — Telehealth: Payer: Self-pay

## 2022-07-03 ENCOUNTER — Other Ambulatory Visit: Payer: Self-pay

## 2022-07-03 DIAGNOSIS — I5032 Chronic diastolic (congestive) heart failure: Secondary | ICD-10-CM

## 2022-07-03 NOTE — Telephone Encounter (Signed)
Most recent labs from [place] reviewed. Potassium levels are within normal limits now. His renal function is currently 1.8 mg/dL, baseline is around 1.6 mg/dL. To avoid acute kidney injury we will hold off on Entresto for now. Monitor weights and physical findings of fluid overload.  Reyonna Haack [place], DO, [facility]

## 2022-07-07 NOTE — Telephone Encounter (Signed)
I spoke to patient's wife and made her aware she voiced understanding

## 2022-07-23 ENCOUNTER — Other Ambulatory Visit: Payer: Self-pay | Admitting: Cardiology

## 2022-08-01 ENCOUNTER — Emergency Department (HOSPITAL_COMMUNITY): Payer: Medicare Other

## 2022-08-01 ENCOUNTER — Inpatient Hospital Stay (HOSPITAL_COMMUNITY)
Admission: EM | Admit: 2022-08-01 | Discharge: 2022-09-14 | DRG: 388 | Disposition: E | Payer: Medicare Other | Attending: Emergency Medicine | Admitting: Emergency Medicine

## 2022-08-01 DIAGNOSIS — I255 Ischemic cardiomyopathy: Secondary | ICD-10-CM | POA: Diagnosis present

## 2022-08-01 DIAGNOSIS — K56609 Unspecified intestinal obstruction, unspecified as to partial versus complete obstruction: Secondary | ICD-10-CM

## 2022-08-01 DIAGNOSIS — Z6841 Body Mass Index (BMI) 40.0 and over, adult: Secondary | ICD-10-CM

## 2022-08-01 DIAGNOSIS — Z955 Presence of coronary angioplasty implant and graft: Secondary | ICD-10-CM

## 2022-08-01 DIAGNOSIS — E781 Pure hyperglyceridemia: Secondary | ICD-10-CM | POA: Diagnosis present

## 2022-08-01 DIAGNOSIS — I462 Cardiac arrest due to underlying cardiac condition: Secondary | ICD-10-CM | POA: Diagnosis not present

## 2022-08-01 DIAGNOSIS — R7989 Other specified abnormal findings of blood chemistry: Secondary | ICD-10-CM | POA: Diagnosis present

## 2022-08-01 DIAGNOSIS — I10 Essential (primary) hypertension: Secondary | ICD-10-CM | POA: Diagnosis present

## 2022-08-01 DIAGNOSIS — Z66 Do not resuscitate: Secondary | ICD-10-CM | POA: Diagnosis not present

## 2022-08-01 DIAGNOSIS — J9601 Acute respiratory failure with hypoxia: Secondary | ICD-10-CM | POA: Diagnosis present

## 2022-08-01 DIAGNOSIS — I2510 Atherosclerotic heart disease of native coronary artery without angina pectoris: Secondary | ICD-10-CM | POA: Diagnosis present

## 2022-08-01 DIAGNOSIS — Z515 Encounter for palliative care: Secondary | ICD-10-CM

## 2022-08-01 DIAGNOSIS — G2401 Drug induced subacute dyskinesia: Secondary | ICD-10-CM | POA: Diagnosis present

## 2022-08-01 DIAGNOSIS — E1165 Type 2 diabetes mellitus with hyperglycemia: Secondary | ICD-10-CM | POA: Diagnosis present

## 2022-08-01 DIAGNOSIS — E7800 Pure hypercholesterolemia, unspecified: Secondary | ICD-10-CM | POA: Diagnosis present

## 2022-08-01 DIAGNOSIS — E860 Dehydration: Secondary | ICD-10-CM | POA: Diagnosis present

## 2022-08-01 DIAGNOSIS — I214 Non-ST elevation (NSTEMI) myocardial infarction: Secondary | ICD-10-CM | POA: Diagnosis present

## 2022-08-01 DIAGNOSIS — Z6839 Body mass index (BMI) 39.0-39.9, adult: Secondary | ICD-10-CM

## 2022-08-01 DIAGNOSIS — I252 Old myocardial infarction: Secondary | ICD-10-CM

## 2022-08-01 DIAGNOSIS — I82409 Acute embolism and thrombosis of unspecified deep veins of unspecified lower extremity: Secondary | ICD-10-CM | POA: Diagnosis present

## 2022-08-01 DIAGNOSIS — E876 Hypokalemia: Secondary | ICD-10-CM | POA: Diagnosis not present

## 2022-08-01 DIAGNOSIS — Z809 Family history of malignant neoplasm, unspecified: Secondary | ICD-10-CM

## 2022-08-01 DIAGNOSIS — E785 Hyperlipidemia, unspecified: Secondary | ICD-10-CM | POA: Diagnosis present

## 2022-08-01 DIAGNOSIS — R627 Adult failure to thrive: Secondary | ICD-10-CM | POA: Diagnosis present

## 2022-08-01 DIAGNOSIS — G629 Polyneuropathy, unspecified: Secondary | ICD-10-CM

## 2022-08-01 DIAGNOSIS — E1169 Type 2 diabetes mellitus with other specified complication: Secondary | ICD-10-CM | POA: Diagnosis present

## 2022-08-01 DIAGNOSIS — R5381 Other malaise: Secondary | ICD-10-CM | POA: Diagnosis present

## 2022-08-01 DIAGNOSIS — I21A1 Myocardial infarction type 2: Secondary | ICD-10-CM | POA: Diagnosis not present

## 2022-08-01 DIAGNOSIS — E1142 Type 2 diabetes mellitus with diabetic polyneuropathy: Secondary | ICD-10-CM | POA: Diagnosis present

## 2022-08-01 DIAGNOSIS — Z993 Dependence on wheelchair: Secondary | ICD-10-CM

## 2022-08-01 DIAGNOSIS — E1143 Type 2 diabetes mellitus with diabetic autonomic (poly)neuropathy: Secondary | ICD-10-CM | POA: Diagnosis present

## 2022-08-01 DIAGNOSIS — I5042 Chronic combined systolic (congestive) and diastolic (congestive) heart failure: Secondary | ICD-10-CM | POA: Diagnosis present

## 2022-08-01 DIAGNOSIS — K56600 Partial intestinal obstruction, unspecified as to cause: Principal | ICD-10-CM | POA: Diagnosis present

## 2022-08-01 DIAGNOSIS — K567 Ileus, unspecified: Secondary | ICD-10-CM | POA: Diagnosis present

## 2022-08-01 DIAGNOSIS — G4733 Obstructive sleep apnea (adult) (pediatric): Secondary | ICD-10-CM | POA: Diagnosis present

## 2022-08-01 DIAGNOSIS — K317 Polyp of stomach and duodenum: Secondary | ICD-10-CM | POA: Diagnosis present

## 2022-08-01 DIAGNOSIS — Z888 Allergy status to other drugs, medicaments and biological substances status: Secondary | ICD-10-CM

## 2022-08-01 DIAGNOSIS — Z79899 Other long term (current) drug therapy: Secondary | ICD-10-CM

## 2022-08-01 DIAGNOSIS — K3184 Gastroparesis: Secondary | ICD-10-CM | POA: Diagnosis present

## 2022-08-01 DIAGNOSIS — Z7401 Bed confinement status: Secondary | ICD-10-CM

## 2022-08-01 DIAGNOSIS — N1831 Chronic kidney disease, stage 3a: Secondary | ICD-10-CM | POA: Diagnosis present

## 2022-08-01 DIAGNOSIS — Z91018 Allergy to other foods: Secondary | ICD-10-CM

## 2022-08-01 DIAGNOSIS — Z8249 Family history of ischemic heart disease and other diseases of the circulatory system: Secondary | ICD-10-CM

## 2022-08-01 DIAGNOSIS — I130 Hypertensive heart and chronic kidney disease with heart failure and stage 1 through stage 4 chronic kidney disease, or unspecified chronic kidney disease: Secondary | ICD-10-CM | POA: Diagnosis present

## 2022-08-01 DIAGNOSIS — R112 Nausea with vomiting, unspecified: Secondary | ICD-10-CM | POA: Diagnosis present

## 2022-08-01 DIAGNOSIS — E1122 Type 2 diabetes mellitus with diabetic chronic kidney disease: Secondary | ICD-10-CM | POA: Diagnosis present

## 2022-08-01 DIAGNOSIS — Z86718 Personal history of other venous thrombosis and embolism: Secondary | ICD-10-CM

## 2022-08-01 DIAGNOSIS — E46 Unspecified protein-calorie malnutrition: Secondary | ICD-10-CM | POA: Diagnosis present

## 2022-08-01 DIAGNOSIS — Z8711 Personal history of peptic ulcer disease: Secondary | ICD-10-CM

## 2022-08-01 DIAGNOSIS — Z880 Allergy status to penicillin: Secondary | ICD-10-CM

## 2022-08-01 DIAGNOSIS — K30 Functional dyspepsia: Secondary | ICD-10-CM | POA: Diagnosis present

## 2022-08-01 DIAGNOSIS — R579 Shock, unspecified: Secondary | ICD-10-CM | POA: Diagnosis not present

## 2022-08-01 DIAGNOSIS — Z794 Long term (current) use of insulin: Secondary | ICD-10-CM

## 2022-08-01 DIAGNOSIS — K5989 Other specified functional intestinal disorders: Secondary | ICD-10-CM | POA: Diagnosis present

## 2022-08-01 DIAGNOSIS — Z886 Allergy status to analgesic agent status: Secondary | ICD-10-CM

## 2022-08-01 DIAGNOSIS — Z7901 Long term (current) use of anticoagulants: Secondary | ICD-10-CM

## 2022-08-01 DIAGNOSIS — N179 Acute kidney failure, unspecified: Secondary | ICD-10-CM | POA: Diagnosis present

## 2022-08-01 DIAGNOSIS — I469 Cardiac arrest, cause unspecified: Secondary | ICD-10-CM | POA: Diagnosis not present

## 2022-08-01 DIAGNOSIS — Z7902 Long term (current) use of antithrombotics/antiplatelets: Secondary | ICD-10-CM

## 2022-08-01 DIAGNOSIS — I4901 Ventricular fibrillation: Secondary | ICD-10-CM | POA: Diagnosis not present

## 2022-08-01 DIAGNOSIS — Z86711 Personal history of pulmonary embolism: Secondary | ICD-10-CM

## 2022-08-01 DIAGNOSIS — J45909 Unspecified asthma, uncomplicated: Secondary | ICD-10-CM | POA: Diagnosis present

## 2022-08-01 DIAGNOSIS — E119 Type 2 diabetes mellitus without complications: Secondary | ICD-10-CM

## 2022-08-01 NOTE — ED Provider Notes (Incomplete)
[facility] EMERGENCY DEPARTMENT Provider Note   CSN: [id number] Arrival date & time: 07/30/2022  2301     History {Add pertinent medical, surgical, social history, OB history to HPI:1} Chief Complaint  Patient presents with   Emesis   Chest Pain    Randy Liu is a 71 y.o. male.  Patient presents from home by ambulance.  Patient reports that he had onset of mild mid abdominal discomfort at 6:30 PM.  Patient then had acute episode of nausea and vomiting and since then has had pain in the central chest.  He took nitro at home, chest pain has resolved.  Given Zofran by EMS, still having mild nausea.       Home Medications Prior to Admission medications   Medication Sig Start Date End Date Taking? Authorizing Provider  acetaminophen (TYLENOL) 500 MG tablet Take 1,000 mg by mouth every 8 (eight) hours as needed for moderate pain.    [provider]  albuterol (VENTOLIN HFA) 108 (90 Base) MCG/ACT inhaler Inhale 2 puffs into the lungs every 6 (six) hours as needed for wheezing or shortness of breath.    [provider]  Albuterol Sulfate (PROAIR RESPICLICK) 657 (90 Base) MCG/ACT AEPB Inhale 2 puffs into the lungs every 6 (six) hours as needed (wheezing, shortness of breath).    [provider]  Alpha-Lipoic Acid 300 MG TABS Take 600 mg by mouth daily.    [provider]  benzonatate (TESSALON) 200 MG capsule Take 200 mg by mouth every 8 (eight) hours as needed for cough. 11/18/20   [provider]  Cholecalciferol 25 MCG (1000 UT) capsule Take 4,000 Units by mouth daily.    [provider]  clopidogrel (PLAVIX) 75 MG tablet TAKE ONE (1) TABLET BY MOUTH EVERY DAY 07/23/22   Tolia, Sunit, DO  colchicine-probenecid 0.5-500 MG tablet Take 1 tablet by mouth daily. 11/21/20   [provider]  cyanocobalamin (,VITAMIN B-12,) 1000 MCG/ML injection Inject 1,000 mcg into the skin every 30 (thirty) days. 09/23/20    [provider]  ELIQUIS 5 MG TABS tablet Take 1 tablet (5 mg total) by mouth 2 (two) times daily. Resume on 05/11/21 Patient taking differently: Take 5 mg by mouth 2 (two) times daily. 05/11/21   Antonieta Pert, MD  Evolocumab (REPATHA SURECLICK) 846 MG/ML SOAJ Inject 1 mL into the skin every 14 (fourteen) days. 04/29/22   Terri Skains, Sunit, DO  famotidine-calcium carbonate-magnesium hydroxide (PEPCID COMPLETE) 10-800-165 MG chewable tablet Chew 1 tablet by mouth daily as needed (heartburn).    [provider]  fenofibrate 160 MG tablet Take 160 mg by mouth daily. 12/04/20   [provider]  fluticasone (FLONASE) 50 MCG/ACT nasal spray Place 1 spray into both nostrils as needed. 07/08/21   [provider]  guaiFENesin-codeine 100-10 MG/5ML syrup Take 5 mLs by mouth 3 (three) times daily as needed for cough. 04/07/22   [provider]  insulin aspart protamine- aspart (NOVOLOG MIX 70/30) (70-30) 100 UNIT/ML injection Inject 40-85 Units into the skin 2 (two) times daily. Sliding scale:  100-140 : 40 units  141-180 : 45 units  181-220 : 50 units  221-260 : 55 units  261-300 : 60 units  301-340 : 65 units  341-380 : 70 units  381-420 : 75 units  421-460 : 80 units  461-500 : 85 units - recheck in 1 hour and notify MD    [provider]  latanoprost (XALATAN) 0.005 % ophthalmic solution Place  1 drop into the left eye at bedtime. 10/29/20   [provider]  loratadine (CLARITIN) 10 MG tablet Take 10 mg by mouth daily. 01/20/22   [provider]  magnesium oxide (MAG-OX) 400 MG tablet Take 1 tablet (400 mg total) by mouth 3 (three) times daily. 03/18/22   Tolia, Sunit, DO  meclizine (ANTIVERT) 25 MG tablet Take 25 mg by mouth 3 (three) times daily as needed for nausea.    [provider]  melatonin 5 MG TABS Take 5 mg by mouth at bedtime as needed (sleep).    [provider]  metoCLOPramide (REGLAN) 5 MG tablet Take 5 mg by  mouth at bedtime. 10/09/20   [provider]  metoprolol succinate (TOPROL-XL) 25 MG 24 hr tablet Take 0.5 tablets (12.5 mg total) by mouth daily. 05/07/22   Tolia, Sunit, DO  montelukast (SINGULAIR) 10 MG tablet Take 10 mg by mouth at bedtime. 09/28/20   [provider]  nitroGLYCERIN (NITROSTAT) 0.4 MG SL tablet Place 0.4 mg under the tongue every 5 (five) minutes as needed for chest pain.    [provider]  ondansetron (ZOFRAN-ODT) 4 MG disintegrating tablet Take 1 tablet (4 mg total) by mouth every 8 (eight) hours as needed for nausea or vomiting. 09/05/21   Sharen Hones, MD  pantoprazole (PROTONIX) 40 MG tablet Take 1 tablet (40 mg total) by mouth 2 (two) times daily. 09/05/21   Sharen Hones, MD  polyethylene glycol (MIRALAX / GLYCOLAX) 17 g packet Take 17 g by mouth 2 (two) times daily. Patient taking differently: Take 17 g by mouth 2 (two) times daily as needed for mild constipation. 11/27/21   Pokhrel, Corrie Mckusick, MD  Probiotic Product (ALIGN) 4 MG CAPS Take 4 mg by mouth daily.    [provider]  sennosides-docusate sodium (SENOKOT-S) 8.6-50 MG tablet Take 1 tablet by mouth daily.    [provider]  sodium chloride (MURO 128) 5 % ophthalmic ointment Place 1 application  into both eyes at bedtime as needed for eye irritation.    [provider]  sucralfate (CARAFATE) 1 GM/10ML suspension Take 10 mLs (1 g total) by mouth 2 (two) times daily. 09/05/21   Sharen Hones, MD  torsemide (DEMADEX) 10 MG tablet Take 0.5 tablets (5 mg total) by mouth daily. 04/15/22   Tolia, Sunit, DO  VASCEPA 1 g capsule TAKE ONE TABLET BY MOUTH TWICE DAILY 04/07/22   Tolia, Sunit, DO      Allergies    Nsaids, Reglan [metoclopramide], Augmentin [amoxicillin-pot clavulanate], Statins, Chocolate, Other, Spiriva respimat [tiotropium bromide monohydrate], Strawberry (diagnostic), Jardiance [empagliflozin], and Zyloprim [allopurinol]    Review of Systems   Review of  Systems  Physical Exam Updated Vital Signs BP (!) 136/90   Pulse (!) 117   Resp 16   SpO2 95%  Physical Exam Vitals and nursing note reviewed.  Constitutional:      General: He is not in acute distress.    Appearance: He is well-developed.  HENT:     Head: Normocephalic and atraumatic.     Mouth/Throat:     Mouth: Mucous membranes are moist.  Eyes:     General: Vision grossly intact. Gaze aligned appropriately.     Extraocular Movements: Extraocular movements intact.     Conjunctiva/sclera: Conjunctivae normal.  Cardiovascular:     Rate and Rhythm: Normal rate and regular rhythm.     Pulses: Normal pulses.     Heart sounds: Normal heart sounds, S1 normal and S2  normal. No murmur heard.    No friction rub. No gallop.  Pulmonary:     Effort: Pulmonary effort is normal. No respiratory distress.     Breath sounds: Normal breath sounds.  Abdominal:     Palpations: Abdomen is soft.     Tenderness: There is abdominal tenderness in the epigastric area. There is no guarding or rebound.     Hernia: No hernia is present.  Musculoskeletal:        General: No swelling.     Cervical back: Full passive range of motion without pain, normal range of motion and neck supple. No pain with movement, spinous process tenderness or muscular tenderness. Normal range of motion.     Right lower leg: No edema.     Left lower leg: No edema.  Skin:    General: Skin is warm and dry.     Capillary Refill: Capillary refill takes less than 2 seconds.     Findings: No ecchymosis, erythema, lesion or wound.  Neurological:     Mental Status: He is alert and oriented to person, place, and time.     GCS: GCS eye subscore is 4. GCS verbal subscore is 5. GCS motor subscore is 6.     Cranial Nerves: Cranial nerves 2-12 are intact.     Sensory: Sensation is intact.     Motor: Motor function is intact. No weakness or abnormal muscle tone.     Coordination: Coordination is intact.  Psychiatric:        Mood and  Affect: Mood normal.        Speech: Speech normal.        Behavior: Behavior normal.     ED Results / Procedures / Treatments   Labs (all labs ordered are listed, but only abnormal results are displayed) Labs Reviewed  CBC WITH DIFFERENTIAL/PLATELET  COMPREHENSIVE METABOLIC PANEL  LIPASE, BLOOD  LACTIC ACID, PLASMA  URINALYSIS, ROUTINE W REFLEX MICROSCOPIC  TROPONIN I (HIGH SENSITIVITY)    EKG None  Radiology No results found.  Procedures Procedures  {Document cardiac monitor, telemetry assessment procedure when appropriate:1}  Medications Ordered in ED Medications - No data to display  ED Course/ Medical Decision Making/ A&P                           Medical Decision Making Amount and/or Complexity of Data Reviewed Labs: ordered. Radiology: ordered.   ***  {Document critical care time when appropriate:1} {Document review of labs and clinical decision tools ie heart score, Chads2Vasc2 etc:1}  {Document your independent review of radiology images, and any outside records:1} {Document your discussion with family members, caretakers, and with consultants:1} {Document social determinants of health affecting pt's care:1} {Document your decision making why or why not admission, treatments were needed:1} Final Clinical Impression(s) / ED Diagnoses Final diagnoses:  None    Rx / DC Orders ED Discharge Orders     None

## 2022-08-01 NOTE — ED Triage Notes (Signed)
Patient BIB by EMS from home for vomiting since 1830, chest pain without radiation. Zofran 31m IV, 20gLAC. VSS.

## 2022-08-02 ENCOUNTER — Other Ambulatory Visit: Payer: Self-pay

## 2022-08-02 ENCOUNTER — Inpatient Hospital Stay (HOSPITAL_COMMUNITY): Payer: Medicare Other

## 2022-08-02 ENCOUNTER — Encounter (HOSPITAL_COMMUNITY): Payer: Self-pay | Admitting: Family Medicine

## 2022-08-02 ENCOUNTER — Emergency Department (HOSPITAL_COMMUNITY): Payer: Medicare Other

## 2022-08-02 DIAGNOSIS — I462 Cardiac arrest due to underlying cardiac condition: Secondary | ICD-10-CM | POA: Diagnosis not present

## 2022-08-02 DIAGNOSIS — Z6841 Body Mass Index (BMI) 40.0 and over, adult: Secondary | ICD-10-CM | POA: Diagnosis not present

## 2022-08-02 DIAGNOSIS — I10 Essential (primary) hypertension: Secondary | ICD-10-CM | POA: Diagnosis not present

## 2022-08-02 DIAGNOSIS — N1831 Chronic kidney disease, stage 3a: Secondary | ICD-10-CM | POA: Diagnosis present

## 2022-08-02 DIAGNOSIS — E1142 Type 2 diabetes mellitus with diabetic polyneuropathy: Secondary | ICD-10-CM | POA: Diagnosis present

## 2022-08-02 DIAGNOSIS — R579 Shock, unspecified: Secondary | ICD-10-CM | POA: Diagnosis not present

## 2022-08-02 DIAGNOSIS — K56609 Unspecified intestinal obstruction, unspecified as to partial versus complete obstruction: Secondary | ICD-10-CM

## 2022-08-02 DIAGNOSIS — E1169 Type 2 diabetes mellitus with other specified complication: Secondary | ICD-10-CM

## 2022-08-02 DIAGNOSIS — R112 Nausea with vomiting, unspecified: Secondary | ICD-10-CM | POA: Diagnosis not present

## 2022-08-02 DIAGNOSIS — J45909 Unspecified asthma, uncomplicated: Secondary | ICD-10-CM | POA: Diagnosis present

## 2022-08-02 DIAGNOSIS — I252 Old myocardial infarction: Secondary | ICD-10-CM | POA: Diagnosis not present

## 2022-08-02 DIAGNOSIS — K3184 Gastroparesis: Secondary | ICD-10-CM | POA: Diagnosis not present

## 2022-08-02 DIAGNOSIS — E1165 Type 2 diabetes mellitus with hyperglycemia: Secondary | ICD-10-CM | POA: Diagnosis present

## 2022-08-02 DIAGNOSIS — Z7189 Other specified counseling: Secondary | ICD-10-CM | POA: Diagnosis not present

## 2022-08-02 DIAGNOSIS — K5989 Other specified functional intestinal disorders: Secondary | ICD-10-CM | POA: Diagnosis not present

## 2022-08-02 DIAGNOSIS — E7800 Pure hypercholesterolemia, unspecified: Secondary | ICD-10-CM | POA: Diagnosis present

## 2022-08-02 DIAGNOSIS — I5042 Chronic combined systolic (congestive) and diastolic (congestive) heart failure: Secondary | ICD-10-CM | POA: Diagnosis present

## 2022-08-02 DIAGNOSIS — K567 Ileus, unspecified: Secondary | ICD-10-CM | POA: Diagnosis present

## 2022-08-02 DIAGNOSIS — E1122 Type 2 diabetes mellitus with diabetic chronic kidney disease: Secondary | ICD-10-CM | POA: Diagnosis present

## 2022-08-02 DIAGNOSIS — I130 Hypertensive heart and chronic kidney disease with heart failure and stage 1 through stage 4 chronic kidney disease, or unspecified chronic kidney disease: Secondary | ICD-10-CM | POA: Diagnosis present

## 2022-08-02 DIAGNOSIS — Z794 Long term (current) use of insulin: Secondary | ICD-10-CM | POA: Diagnosis not present

## 2022-08-02 DIAGNOSIS — J9601 Acute respiratory failure with hypoxia: Secondary | ICD-10-CM | POA: Diagnosis not present

## 2022-08-02 DIAGNOSIS — R5381 Other malaise: Secondary | ICD-10-CM | POA: Diagnosis not present

## 2022-08-02 DIAGNOSIS — Z66 Do not resuscitate: Secondary | ICD-10-CM | POA: Diagnosis not present

## 2022-08-02 DIAGNOSIS — I469 Cardiac arrest, cause unspecified: Secondary | ICD-10-CM | POA: Diagnosis not present

## 2022-08-02 DIAGNOSIS — Z515 Encounter for palliative care: Secondary | ICD-10-CM | POA: Diagnosis not present

## 2022-08-02 DIAGNOSIS — E46 Unspecified protein-calorie malnutrition: Secondary | ICD-10-CM | POA: Diagnosis present

## 2022-08-02 DIAGNOSIS — I214 Non-ST elevation (NSTEMI) myocardial infarction: Secondary | ICD-10-CM

## 2022-08-02 DIAGNOSIS — K56600 Partial intestinal obstruction, unspecified as to cause: Secondary | ICD-10-CM | POA: Diagnosis present

## 2022-08-02 DIAGNOSIS — I4901 Ventricular fibrillation: Secondary | ICD-10-CM | POA: Diagnosis not present

## 2022-08-02 DIAGNOSIS — I2510 Atherosclerotic heart disease of native coronary artery without angina pectoris: Secondary | ICD-10-CM | POA: Diagnosis not present

## 2022-08-02 DIAGNOSIS — G629 Polyneuropathy, unspecified: Secondary | ICD-10-CM | POA: Diagnosis not present

## 2022-08-02 DIAGNOSIS — I21A1 Myocardial infarction type 2: Secondary | ICD-10-CM | POA: Diagnosis not present

## 2022-08-02 DIAGNOSIS — E1143 Type 2 diabetes mellitus with diabetic autonomic (poly)neuropathy: Secondary | ICD-10-CM | POA: Diagnosis present

## 2022-08-02 DIAGNOSIS — E785 Hyperlipidemia, unspecified: Secondary | ICD-10-CM

## 2022-08-02 LAB — CBC WITH DIFFERENTIAL/PLATELET
Abs Immature Granulocytes: 0.02 10*3/uL (ref 0.00–0.07)
Abs Immature Granulocytes: 0.04 10*3/uL (ref 0.00–0.07)
Basophils Absolute: 0 10*3/uL (ref 0.0–0.1)
Basophils Absolute: 0 10*3/uL (ref 0.0–0.1)
Basophils Relative: 0 %
Basophils Relative: 1 %
Eosinophils Absolute: 0 10*3/uL (ref 0.0–0.5)
Eosinophils Absolute: 0 10*3/uL (ref 0.0–0.5)
Eosinophils Relative: 0 %
Eosinophils Relative: 0 %
HCT: 44.6 % (ref 39.0–52.0)
HCT: 45.8 % (ref 39.0–52.0)
Hemoglobin: 14 g/dL (ref 13.0–17.0)
Hemoglobin: 14.5 g/dL (ref 13.0–17.0)
Immature Granulocytes: 0 %
Immature Granulocytes: 0 %
Lymphocytes Relative: 26 %
Lymphocytes Relative: 9 %
Lymphs Abs: 1 10*3/uL (ref 0.7–4.0)
Lymphs Abs: 1.4 10*3/uL (ref 0.7–4.0)
MCH: 29.7 pg (ref 26.0–34.0)
MCH: 29.8 pg (ref 26.0–34.0)
MCHC: 31.4 g/dL (ref 30.0–36.0)
MCHC: 31.7 g/dL (ref 30.0–36.0)
MCV: 94.2 fL (ref 80.0–100.0)
MCV: 94.7 fL (ref 80.0–100.0)
Monocytes Absolute: 0.6 10*3/uL (ref 0.1–1.0)
Monocytes Absolute: 0.7 10*3/uL (ref 0.1–1.0)
Monocytes Relative: 11 %
Monocytes Relative: 6 %
Neutro Abs: 3.3 10*3/uL (ref 1.7–7.7)
Neutro Abs: 9.4 10*3/uL — ABNORMAL HIGH (ref 1.7–7.7)
Neutrophils Relative %: 62 %
Neutrophils Relative %: 85 %
Platelets: 261 10*3/uL (ref 150–400)
Platelets: 270 10*3/uL (ref 150–400)
RBC: 4.71 MIL/uL (ref 4.22–5.81)
RBC: 4.86 MIL/uL (ref 4.22–5.81)
RDW: 15.9 % — ABNORMAL HIGH (ref 11.5–15.5)
RDW: 15.9 % — ABNORMAL HIGH (ref 11.5–15.5)
WBC: 11.2 10*3/uL — ABNORMAL HIGH (ref 4.0–10.5)
WBC: 5.4 10*3/uL (ref 4.0–10.5)
nRBC: 0 % (ref 0.0–0.2)
nRBC: 0 % (ref 0.0–0.2)

## 2022-08-02 LAB — MAGNESIUM: Magnesium: 2.4 mg/dL (ref 1.7–2.4)

## 2022-08-02 LAB — LIPID PANEL
Cholesterol: 151 mg/dL (ref 0–200)
HDL: 26 mg/dL — ABNORMAL LOW (ref >40)
LDL Cholesterol: 83 mg/dL (ref 0–99)
Total CHOL/HDL Ratio: 5.8 RATIO
Triglycerides: 209 mg/dL — ABNORMAL HIGH (ref <150)
VLDL: 42 mg/dL — ABNORMAL HIGH (ref 0–40)

## 2022-08-02 LAB — COMPREHENSIVE METABOLIC PANEL
ALT: 23 U/L (ref 0–44)
ALT: 23 U/L (ref 0–44)
AST: 23 U/L (ref 15–41)
AST: 24 U/L (ref 15–41)
Albumin: 3.5 g/dL (ref 3.5–5.0)
Albumin: 3.3 g/dL — ABNORMAL LOW (ref 3.5–5.0)
Alkaline Phosphatase: 39 U/L (ref 38–126)
Alkaline Phosphatase: 40 U/L (ref 38–126)
Anion gap: 17 — ABNORMAL HIGH (ref 5–15)
Anion gap: 16 — ABNORMAL HIGH (ref 5–15)
BUN: 33 mg/dL — ABNORMAL HIGH (ref 8–23)
BUN: 34 mg/dL — ABNORMAL HIGH (ref 8–23)
CO2: 21 mmol/L — ABNORMAL LOW (ref 22–32)
CO2: 25 mmol/L (ref 22–32)
Calcium: 10 mg/dL (ref 8.9–10.3)
Calcium: 9.9 mg/dL (ref 8.9–10.3)
Chloride: 101 mmol/L (ref 98–111)
Chloride: 97 mmol/L — ABNORMAL LOW (ref 98–111)
Creatinine, Ser: 2.08 mg/dL — ABNORMAL HIGH (ref 0.61–1.24)
Creatinine, Ser: 2.09 mg/dL — ABNORMAL HIGH (ref 0.61–1.24)
GFR, Estimated: 33 mL/min — ABNORMAL LOW (ref >60)
GFR, Estimated: 33 mL/min — ABNORMAL LOW (ref >60)
Glucose, Bld: 265 mg/dL — ABNORMAL HIGH (ref 70–99)
Glucose, Bld: 255 mg/dL — ABNORMAL HIGH (ref 70–99)
Potassium: 4.5 mmol/L (ref 3.5–5.1)
Potassium: 4.7 mmol/L (ref 3.5–5.1)
Sodium: 139 mmol/L (ref 135–145)
Sodium: 138 mmol/L (ref 135–145)
Total Bilirubin: 0.7 mg/dL (ref 0.3–1.2)
Total Bilirubin: 0.7 mg/dL (ref 0.3–1.2)
Total Protein: 7.1 g/dL (ref 6.5–8.1)
Total Protein: 6.8 g/dL (ref 6.5–8.1)

## 2022-08-02 LAB — GLUCOSE, CAPILLARY
Glucose-Capillary: 259 mg/dL — ABNORMAL HIGH (ref 70–99)
Glucose-Capillary: 204 mg/dL — ABNORMAL HIGH (ref 70–99)

## 2022-08-02 LAB — LIPASE, BLOOD: Lipase: 24 U/L (ref 11–51)

## 2022-08-02 LAB — APTT: aPTT: 75 seconds — ABNORMAL HIGH (ref 24–36)

## 2022-08-02 LAB — LACTIC ACID, PLASMA: Lactic Acid, Venous: 1.6 mmol/L (ref 0.5–1.9)

## 2022-08-02 LAB — TROPONIN I (HIGH SENSITIVITY)
Troponin I (High Sensitivity): 30 ng/L — ABNORMAL HIGH (ref <18)
Troponin I (High Sensitivity): 200 ng/L (ref <18)

## 2022-08-02 LAB — CBG MONITORING, ED: Glucose-Capillary: 267 mg/dL — ABNORMAL HIGH (ref 70–99)

## 2022-08-02 LAB — HEPARIN LEVEL (UNFRACTIONATED): Heparin Unfractionated: >1.1 IU/mL — ABNORMAL HIGH (ref 0.30–0.70)

## 2022-08-02 MED ORDER — ACETAMINOPHEN 325 MG PO TABS
650.0000 mg | ORAL_TABLET | ORAL | Status: DC | PRN
  Administered 2022-08-09 – 2022-08-12 (×5): 650 mg via ORAL
  Filled 2022-08-02 (×5): qty 2

## 2022-08-02 MED ORDER — INSULIN GLARGINE-YFGN 100 UNIT/ML ~~LOC~~ SOLN
25.0000 [IU] | Freq: Every day | SUBCUTANEOUS | Status: DC
  Administered 2022-08-02 – 2022-08-13 (×12): 25 [IU] via SUBCUTANEOUS
  Filled 2022-08-02 (×13): qty 0.25

## 2022-08-02 MED ORDER — INSULIN ASPART PROT & ASPART (70-30 MIX) 100 UNIT/ML ~~LOC~~ SUSP
20.0000 [IU] | Freq: Two times a day (BID) | SUBCUTANEOUS | Status: DC
  Filled 2022-08-02: qty 10

## 2022-08-02 MED ORDER — PANTOPRAZOLE SODIUM 40 MG IV SOLR
40.0000 mg | Freq: Two times a day (BID) | INTRAVENOUS | Status: DC
  Administered 2022-08-02 – 2022-08-09 (×15): 40 mg via INTRAVENOUS
  Filled 2022-08-02 (×15): qty 10

## 2022-08-02 MED ORDER — ASPIRIN 81 MG PO CHEW: 324.0000 mg | CHEWABLE_TABLET | ORAL | Status: AC

## 2022-08-02 MED ORDER — NITROGLYCERIN 0.4 MG SL SUBL: 0.4000 mg | SUBLINGUAL_TABLET | SUBLINGUAL | Status: DC | PRN

## 2022-08-02 MED ORDER — ASPIRIN 300 MG RE SUPP
300.0000 mg | RECTAL | Status: AC
  Administered 2022-08-02: 300 mg via RECTAL
  Filled 2022-08-02: qty 1

## 2022-08-02 MED ORDER — CLOPIDOGREL BISULFATE 75 MG PO TABS
75.0000 mg | ORAL_TABLET | Freq: Every day | ORAL | Status: DC
  Filled 2022-08-02: qty 1

## 2022-08-02 MED ORDER — METOPROLOL TARTRATE 5 MG/5ML IV SOLN
5.0000 mg | Freq: Three times a day (TID) | INTRAVENOUS | Status: DC
  Administered 2022-08-02 – 2022-08-14 (×35): 5 mg via INTRAVENOUS
  Filled 2022-08-02 (×36): qty 5

## 2022-08-02 MED ORDER — HEPARIN (PORCINE) 25000 UT/250ML-% IV SOLN
1350.0000 [IU]/h | INTRAVENOUS | Status: DC
  Administered 2022-08-02: 1400 [IU]/h via INTRAVENOUS
  Administered 2022-08-03: 1350 [IU]/h via INTRAVENOUS
  Administered 2022-08-03: 1400 [IU]/h via INTRAVENOUS
  Filled 2022-08-02 (×3): qty 250

## 2022-08-02 MED ORDER — SODIUM CHLORIDE 0.9 % IV SOLN: INTRAVENOUS | Status: DC

## 2022-08-02 MED ORDER — METOPROLOL SUCCINATE ER 25 MG PO TB24: 12.5000 mg | ORAL_TABLET | Freq: Every day | ORAL | Status: DC

## 2022-08-02 MED ORDER — ONDANSETRON HCL 4 MG/2ML IJ SOLN
4.0000 mg | Freq: Four times a day (QID) | INTRAMUSCULAR | Status: DC | PRN
  Administered 2022-08-02 – 2022-08-14 (×9): 4 mg via INTRAVENOUS
  Filled 2022-08-02 (×9): qty 2

## 2022-08-02 MED ORDER — DIATRIZOATE MEGLUMINE & SODIUM 66-10 % PO SOLN
90.0000 mL | Freq: Once | ORAL | Status: AC
  Administered 2022-08-02: 90 mL via NASOGASTRIC
  Filled 2022-08-02: qty 90

## 2022-08-02 NOTE — Progress Notes (Signed)
Refused cpap.

## 2022-08-02 NOTE — Progress Notes (Signed)
ANTICOAGULATION CONSULT NOTE - Initial Consult  Pharmacy Consult for Heparin (Apixaban on hold) Indication: chest pain/ACS, history of VTE  Allergies  Allergen Reactions   Nsaids Other (See Comments)    Renal Insufficiency Kidney issues    Reglan [Metoclopramide] Itching    Loss of Balance Urinary Incontinence Can tolerate low dose at bedtime   Augmentin [Amoxicillin-Pot Clavulanate] Diarrhea and Nausea And Vomiting    Abdomen pain    Statins Other (See Comments)    Myalgias Myositis     Chocolate Itching    Can tolerate if taking Benadryl   Other Other (See Comments)    All nuts - told to avoid due to GI tract issues   Spiriva Respimat [Tiotropium Bromide Monohydrate] Other (See Comments)    Urinary retention   Strawberry (Diagnostic) Itching    Can tolerate if taking Benadryl   Jardiance [Empagliflozin] Other (See Comments)    Fainting Weakness  Lightheaded  Falling    Zyloprim [Allopurinol] Rash      Vital Signs: Temp: 98.1 F (36.7 C) (11/19 0030) Temp Source: Oral (11/19 0030) BP: 127/76 (11/19 0300) Pulse Rate: 100 (11/19 0300)  Labs: Recent Labs    08/02/22 0004 08/02/22 0156  HGB 14.5  --   HCT 45.8  --   PLT 270  --   CREATININE 2.08*  --   TROPONINIHS 30* 200*    CrCl cannot be calculated (Unknown ideal weight.).   Medical History: Past Medical History:  Diagnosis Date   Anal fissure    Asthma    Cardiomyopathy ([place])    CHF (congestive heart failure) ([place])    Coronary artery disease    Diabetes mellitus without complication ([place])    History of DVT (deep vein thrombosis)    History of ulcer disease    Hyperlipidemia    Hypertension    Peripheral neuropathy    Sleep apnea     Assessment: 71 y/o M on apixaban PTA for history of VTE, in the ED with vomiting and chest pain, troponin is mildly elevated, starting heparin and holding apixaban for now, anticipate using aPTT to dose for now, last dose of apixaban was 11/18 at 2130, will  start heparin 12 hours from that dose.   Goal of Therapy:  Heparin level 0.3-0.7 units/ml aPTT 66-102 seconds Monitor platelets by anticoagulation protocol: Yes   Plan:  Start heparin drip at 1400 units/hr at 0930 today 1700 aPTT and heparin level Daily CBC, heparin level, and aPTT Monitor for bleeding  Narda Bonds, PharmD, BCPS Clinical Pharmacist Phone: [phone]

## 2022-08-02 NOTE — Progress Notes (Signed)
Patient without EKG abnormality, NSTEMI secondary to small vessel disease and demand ischemia. Hold Plavix for now. Heparin for 48 hours and if no further chest pain, discontinue Heparin and if no surgery, restart Plavix.   Not a candidate for any cardiac evaluation, patient profoundly ill with multiple medical issues and essentially bed bound and would not recommend any further invasive cardiac work up.   Eliquis on hold, h/o DVT and PE (details not completely known to me). Would hold Plavix (H?O stenting of RCA in March 2023. Severe diffuse CAD. Not a surgical candidate for CABG.  With regards to SBO, high risk from surgical standpoint for exploratory lap if needed, but to proceed if no options.  D/W primary team.  Lavone Nian

## 2022-08-02 NOTE — Progress Notes (Signed)
ANTICOAGULATION CONSULT NOTE - Initial Consult  Pharmacy Consult for Heparin (Apixaban on hold) Indication: chest pain/ACS, history of VTE  Allergies  Allergen Reactions   Nsaids Other (See Comments)    Renal Insufficiency Kidney issues    Augmentin [Amoxicillin-Pot Clavulanate] Diarrhea and Nausea And Vomiting    Abdomen pain    Statins Other (See Comments)    Myalgias Myositis     Chocolate Itching    Can tolerate if taking Benadryl   Other Other (See Comments)    All nuts - told to avoid due to GI tract issues   Spiriva Respimat [Tiotropium Bromide Monohydrate] Other (See Comments)    Urinary retention   Strawberry (Diagnostic) Itching    Can tolerate if taking Benadryl   Jardiance [Empagliflozin] Other (See Comments)    Fainting Weakness  Lightheaded  Falling    Zyloprim [Allopurinol] Rash      Vital Signs: Temp: 97.5 F (36.4 C) (11/19 1543) Temp Source: Oral (11/19 1543) BP: 128/73 (11/19 1543) Pulse Rate: 86 (11/19 1543)  Labs: Recent Labs    08/02/22 0004 08/02/22 0156 08/02/22 0630 08/02/22 1739  HGB 14.5  --  14.0  --   HCT 45.8  --  44.6  --   PLT 270  --  261  --   APTT  --   --   --  75*  HEPARINUNFRC  --   --   --  >1.10*  CREATININE 2.08*  --  2.09*  --   TROPONINIHS 30* 200*  --   --      Estimated Creatinine Clearance: 43.9 mL/min (A) (by C-G formula based on SCr of 2.09 mg/dL (H)).   Medical History: Past Medical History:  Diagnosis Date   Anal fissure    Asthma    Cardiomyopathy ([place])    CHF (congestive heart failure) ([place])    Coronary artery disease    Diabetes mellitus without complication ([place])    History of DVT (deep vein thrombosis)    History of ulcer disease    Hyperlipidemia    Hypertension    Peripheral neuropathy    Sleep apnea     Assessment: 71 y/o M on apixaban PTA for history of VTE, in the ED with vomiting and chest pain, troponin is mildly elevated, starting heparin and holding apixaban for now,  anticipate using aPTT to dose for now, last dose of apixaban was 11/18 at 2130, heparin dose was started 12 hours from the last apixaban dose.   Per cardiology, holding plavix for now with plan for heparin for 48 hours, if no further chest pain, then discontinue heparin and if no surgery, then can restart plavix. Heparin level is still elevated and aPTT is 75 and within range. CBC is within normal limits. No s/sx of bleeding or issues with the line reported. Nurse noted the IV heparin line has normal saline running in with it. She tried getting another separate IV just for heparin but the IV nurse refused. We will continue to monitor his status and adjust as necessary.  Goal of Therapy:  Heparin level 0.3-0.7 units/ml aPTT 66-102 seconds Monitor platelets by anticoagulation protocol: Yes   Plan:  Continue heparin drip at 1400 units/hr Follow up aPTT and heparin level in ~8hr Daily CBC, heparin level, and aPTT Monitor for bleeding  Varney Daily, PharmD PGY2 Pharmacy Resident  Please check AMION for all [facility] pharmacy phone numbers After 10:00 PM call main pharmacy [phone]

## 2022-08-02 NOTE — ED Notes (Signed)
Dr. Betsey Holiday notified of pt's critical troponin of 200

## 2022-08-02 NOTE — Consult Note (Signed)
Randy Liu 07/02/1951  [id number].    Requesting MD: Dr. 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  He was given an imodium which helped, but then caused constipation.  He was given miralax and had a large BM on Wednesday.  His last BM was on Thursday, but last passed flatus here in the ED a few minutes ago.  On Friday he ate a grilled chicken sandwich from [place].  Around 0300am he developed nausea and vomiting.  This settled but later Saturday he began having more N/V and back between his shoulder blades going to his neck.  He was brought into the ED and underwent a work up which revealed some dilated areas of small bowel but no direct transition, c/w enteritis vs psbo.  We have been asked to see.  ROS: ROS: please see HPI  Family History  Problem Relation Age of Onset   Heart failure Mother    Heart disease Mother    Cancer Father     Past Medical History:  Diagnosis Date   Anal fissure    Asthma    Cardiomyopathy ([place])    CHF (congestive heart failure) ([place])    Coronary artery disease    Diabetes mellitus without complication ([place])    History of DVT (deep vein thrombosis)    History of ulcer disease    Hyperlipidemia    Hypertension    Peripheral neuropathy    Sleep apnea     Past Surgical History:  Procedure Laterality Date   BIOPSY  12/14/2020   Procedure: BIOPSY;  Surgeon: Jackquline Denmark, MD;  [facility];  Service: Endoscopy;;   BIOPSY  09/05/2021   Procedure: BIOPSY;  Surgeon: Irving Copas., MD;  Location: [facility];  Service: Gastroenterology;;   CHOLECYSTECTOMY N/A 12/17/2020   Procedure: LAPAROSCOPIC CHOLECYSTECTOMY, LIVER BIOPSY, PRIMARY UMBILICAL HERNIA REPAIR;  Surgeon: Armandina Gemma, MD;   [facility];  Service: General;  Laterality: N/A;   COLONOSCOPY WITH PROPOFOL N/A 05/08/2021   Procedure: COLONOSCOPY WITH PROPOFOL;  Surgeon: Carol Ada, MD;  [facility];  Service: Endoscopy;  Laterality: N/A;   CORONARY STENT INTERVENTION N/A 11/21/2021   Procedure: CORONARY STENT INTERVENTION;  Surgeon: Nigel Mormon, MD;  Location: [facility];  Service: Cardiovascular;  Laterality: N/A;   ENDOSCOPIC RETROGRADE CHOLANGIOPANCREATOGRAPHY (ERCP) WITH PROPOFOL N/A 12/14/2020   Procedure: ENDOSCOPIC RETROGRADE CHOLANGIOPANCREATOGRAPHY (ERCP) WITH PROPOFOL;  Surgeon: Jackquline Denmark, MD;  [facility];  Service: Endoscopy;  Laterality: N/A;   ESOPHAGOGASTRODUODENOSCOPY N/A 09/05/2021   Procedure: ESOPHAGOGASTRODUODENOSCOPY (EGD);  Surgeon: Irving Copas., MD;  Location: [facility];  Service: Gastroenterology;  Laterality: N/A;   HEMOSTASIS CLIP PLACEMENT  09/05/2021   Procedure: HEMOSTASIS CLIP PLACEMENT;  Surgeon: Irving Copas., MD;  Location: [facility];  Service: Gastroenterology;;   INTRAVASCULAR ULTRASOUND/IVUS N/A 11/21/2021   Procedure: Intravascular Ultrasound/IVUS;  Surgeon: Nigel Mormon, MD;  Location: [facility];  Service: Cardiovascular;  Laterality: N/A;   LEFT HEART CATH AND CORONARY ANGIOGRAPHY N/A 08/12/2021   Procedure: LEFT HEART CATH AND CORONARY ANGIOGRAPHY;  Surgeon: Adrian Prows, MD;  Location: [facility];  Service: Cardiovascular;  Laterality: N/A;   LEFT HEART CATH AND CORONARY ANGIOGRAPHY N/A 11/21/2021   Procedure:  LEFT HEART CATH AND CORONARY ANGIOGRAPHY;  Surgeon: Nigel Mormon, MD;  Location: [facility];  Service: Cardiovascular;  Laterality: N/A;   POLYPECTOMY  05/08/2021   Procedure: POLYPECTOMY;  Surgeon: Carol Ada, MD;  [facility];  Service: Endoscopy;;   POLYPECTOMY  09/05/2021   Procedure: POLYPECTOMY;  Surgeon: Irving Copas., MD;  Location: [facility];  Service: Gastroenterology;;   REMOVAL OF STONES  12/14/2020   Procedure: REMOVAL OF STONES;  Surgeon: Jackquline Denmark, MD;  [facility];  Service: Endoscopy;;   SPHINCTEROTOMY  12/14/2020   Procedure: Joan Mayans;  Surgeon: Jackquline Denmark, MD;  [facility];  Service: Endoscopy;;    Social History:  reports that he has never smoked. He has never used smokeless tobacco. He reports that he does not currently use alcohol. He reports that he does not use drugs.  Allergies:  Allergies  Allergen Reactions   Nsaids Other (See Comments)    Renal Insufficiency Kidney issues    Augmentin [Amoxicillin-Pot Clavulanate] Diarrhea and Nausea And Vomiting    Abdomen pain    Statins Other (See Comments)    Myalgias Myositis     Chocolate Itching    Can tolerate if taking Benadryl   Other Other (See Comments)    All nuts - told to avoid due to GI tract issues   Spiriva Respimat [Tiotropium Bromide Monohydrate] Other (See Comments)    Urinary retention   Strawberry (Diagnostic) Itching    Can tolerate if taking Benadryl   Jardiance [Empagliflozin] Other (See Comments)    Fainting Weakness  Lightheaded  Falling    Zyloprim [Allopurinol] Rash    (Not in a hospital admission)    Physical Exam: Blood pressure 118/77, pulse 93, temperature 97.6 F (36.4 C), temperature source Oral, resp. rate 20, SpO2 95 %. General: pleasant, obese and somewhat chronically ill-appearing white male who is laying in bed in NAD HEENT: head is normocephalic, atraumatic.  Sclera are noninjected.  PERRL and very contracted.  Ears and nose without any masses or lesions.  Mouth is pink and dry Heart: regular, rate, and rhythm.  Normal s1,s2. No obvious murmurs, gallops, or rubs noted.  Palpable radial and pedal pulses bilaterally Lungs: CTAB, no wheezes, rhonchi, or rales noted.  Respiratory effort nonlabored Abd: soft, NT, ND, obese, +BS, no masses, hernias, or organomegaly.  Scar at  umbilicus noted from previous lap chole Psych: A&Ox3 with an appropriate affect.   Results for orders placed or performed during the hospital encounter of 08/09/2022 (from the past 48 hour(s))  CBC with Differential/Platelet     Status: Abnormal   Collection Time: 08/02/22 12:04 AM  Result Value Ref Range   WBC 11.2 (H) 4.0 - 10.5 K/uL   RBC 4.86 4.22 - 5.81 MIL/uL   Hemoglobin 14.5 13.0 - 17.0 g/dL   HCT 45.8 39.0 - 52.0 %   MCV 94.2 80.0 - 100.0 fL   MCH 29.8 26.0 - 34.0 pg   MCHC 31.7 30.0 - 36.0 g/dL   RDW 15.9 (H) 11.5 - 15.5 %   Platelets 270 150 - 400 K/uL   nRBC 0.0 0.0 - 0.2 %   Neutrophils Relative % 85 %   Neutro Abs 9.4 (H) 1.7 - 7.7 K/uL   Lymphocytes Relative 9 %   Lymphs Abs 1.0 0.7 - 4.0 K/uL   Monocytes Relative 6 %   Monocytes Absolute 0.7 0.1 - 1.0 K/uL   Eosinophils Relative 0 %   Eosinophils Absolute 0.0  0.0 - 0.5 K/uL   Basophils Relative 0 %   Basophils Absolute 0.0 0.0 - 0.1 K/uL   Immature Granulocytes 0 %   Abs Immature Granulocytes 0.04 0.00 - 0.07 K/uL    Comment: Performed at [facility], [place] [address], [place], [place] [address]  Comprehensive metabolic panel     Status: Abnormal   Collection Time: 08/02/22 12:04 AM  Result Value Ref Range   Sodium 139 135 - 145 mmol/L   Potassium 4.5 3.5 - 5.1 mmol/L   Chloride 101 98 - 111 mmol/L   CO2 21 (L) 22 - 32 mmol/L   Glucose, Bld 265 (H) 70 - 99 mg/dL    Comment: Glucose reference range applies only to samples taken after fasting for at least 8 hours.   BUN 33 (H) 8 - 23 mg/dL   Creatinine, Ser 2.08 (H) 0.61 - 1.24 mg/dL   Calcium 10.0 8.9 - 10.3 mg/dL   Total Protein 7.1 6.5 - 8.1 g/dL   Albumin 3.5 3.5 - 5.0 g/dL   AST 23 15 - 41 U/L   ALT 23 0 - 44 U/L   Alkaline Phosphatase 39 38 - 126 U/L   Total Bilirubin 0.7 0.3 - 1.2 mg/dL   GFR, Estimated 33 (L) >60 mL/min    Comment: (NOTE) Calculated using the CKD-EPI Creatinine Equation (2021)    Anion gap 17 (H) 5 - 15    Comment:  Performed at [facility] [address] [place], [place] [address]  Lipase, blood     Status: None   Collection Time: 08/02/22 12:04 AM  Result Value Ref Range   Lipase 24 11 - 51 U/L    Comment: Performed at [facility] [address], [place], [place] [address]  Lactic acid, plasma     Status: None   Collection Time: 08/02/22 12:04 AM  Result Value Ref Range   Lactic Acid, Venous 1.6 0.5 - 1.9 mmol/L    Comment: Performed at [facility] [address] [place], [place] [address]  Troponin I (High Sensitivity)     Status: Abnormal   Collection Time: 08/02/22 12:04 AM  Result Value Ref Range   Troponin I (High Sensitivity) 30 (H) <18 ng/L    Comment: (NOTE) Elevated high sensitivity troponin I (hsTnI) values and significant  changes across serial measurements may suggest ACS but many other  chronic and acute conditions are known to elevate hsTnI results.  Refer to the "Links" section for chest pain algorithms and additional  guidance. Performed at [facility], [place] [address], [place], [place] [address]   Troponin I (High Sensitivity)     Status: Abnormal   Collection Time: 08/02/22  1:56 AM  Result Value Ref Range   Troponin I (High Sensitivity) 200 (HH) <18 ng/L    Comment: CRITICAL RESULT CALLED TO, READ BACK BY AND VERIFIED WITH B. BENTON, RN, [phone] 08/02/22, A. RAMSEY (NOTE) Elevated high sensitivity troponin I (hsTnI) values and significant  changes across serial measurements may suggest ACS but many other  chronic and acute conditions are known to elevate hsTnI results.  Refer to the "Links" section for chest pain algorithms and additional  guidance. Performed at [facility], [place] [address] [place],  [address]   Comprehensive metabolic panel     Status: Abnormal   Collection Time: 08/02/22  6:30 AM  Result Value Ref Range   Sodium 138 135 - 145 mmol/L  Potassium 4.7 3.5 - 5.1 mmol/L   Chloride 97 (L) 98 - 111 mmol/L    CO2 25 22 - 32 mmol/L   Glucose, Bld 255 (H) 70 - 99 mg/dL    Comment: Glucose reference range applies only to samples taken after fasting for at least 8 hours.   BUN 34 (H) 8 - 23 mg/dL   Creatinine, Ser 2.09 (H) 0.61 - 1.24 mg/dL   Calcium 9.9 8.9 - 10.3 mg/dL   Total Protein 6.8 6.5 - 8.1 g/dL   Albumin 3.3 (L) 3.5 - 5.0 g/dL   AST 24 15 - 41 U/L   ALT 23 0 - 44 U/L   Alkaline Phosphatase 40 38 - 126 U/L   Total Bilirubin 0.7 0.3 - 1.2 mg/dL   GFR, Estimated 33 (L) >60 mL/min    Comment: (NOTE) Calculated using the CKD-EPI Creatinine Equation (2021)    Anion gap 16 (H) 5 - 15    Comment: Performed at [facility], [place] [address] [place], [place] [address]  Magnesium     Status: None   Collection Time: 08/02/22  6:30 AM  Result Value Ref Range   Magnesium 2.4 1.7 - 2.4 mg/dL    Comment: Performed at [facility] [address], [place], [place] [address]  CBC with Differential/Platelet     Status: Abnormal   Collection Time: 08/02/22  6:30 AM  Result Value Ref Range   WBC 5.4 4.0 - 10.5 K/uL   RBC 4.71 4.22 - 5.81 MIL/uL   Hemoglobin 14.0 13.0 - 17.0 g/dL   HCT 44.6 39.0 - 52.0 %   MCV 94.7 80.0 - 100.0 fL   MCH 29.7 26.0 - 34.0 pg   MCHC 31.4 30.0 - 36.0 g/dL   RDW 15.9 (H) 11.5 - 15.5 %   Platelets 261 150 - 400 K/uL   nRBC 0.0 0.0 - 0.2 %   Neutrophils Relative % 62 %   Neutro Abs 3.3 1.7 - 7.7 K/uL   Lymphocytes Relative 26 %   Lymphs Abs 1.4 0.7 - 4.0 K/uL   Monocytes Relative 11 %   Monocytes Absolute 0.6 0.1 - 1.0 K/uL   Eosinophils Relative 0 %   Eosinophils Absolute 0.0 0.0 - 0.5 K/uL   Basophils Relative 1 %   Basophils Absolute 0.0 0.0 - 0.1 K/uL   Immature Granulocytes 0 %   Abs Immature Granulocytes 0.02 0.00 - 0.07 K/uL    Comment: Performed at [facility] [address], [place], [place] [address]  Lipid panel     Status: Abnormal   Collection Time: 08/02/22  6:30 AM  Result Value Ref Range   Cholesterol 151 0 - 200 mg/dL    Triglycerides 209 (H) <150 mg/dL   HDL 26 (L) >40 mg/dL   Total CHOL/HDL Ratio 5.8 RATIO   VLDL 42 (H) 0 - 40 mg/dL   LDL Cholesterol 83 0 - 99 mg/dL    Comment:        Total Cholesterol/HDL:CHD Risk Coronary Heart Disease Risk Table                     Men   Women  1/2 Average Risk   3.4   3.3  Average Risk       5.0   4.4  2 X Average Risk   9.6   7.1  3 X Average Risk  23.4   11.0  Use the calculated Patient Ratio above and the CHD Risk Table to determine the patient's CHD Risk.        ATP III CLASSIFICATION (LDL):  <100     mg/dL   Optimal  100-129  mg/dL   Near or Above                    Optimal  130-159  mg/dL   Borderline  160-189  mg/dL   High  >190     mg/dL   Very High Performed at [facility] [address] [place], [place] [address]   CBG monitoring, ED     Status: Abnormal   Collection Time: 08/02/22 10:23 AM  Result Value Ref Range   Glucose-Capillary 267 (H) 70 - 99 mg/dL    Comment: Glucose reference range applies only to samples taken after fasting for at least 8 hours.   DG Abd Portable 1 View  Result Date: 08/02/2022 CLINICAL DATA:  71 year old male status post nasogastric tube placement. EXAM: PORTABLE ABDOMEN - 1 VIEW COMPARISON:  04/16/2022. FINDINGS: Enteric tube in position with tip in the proximal stomach. Visualized bowel gas pattern is unremarkable. Surgical clips project over the right upper quadrant of the abdomen, likely from prior cholecystectomy. Inferior abdomen and pelvis are incompletely imaged. IMPRESSION: 1. Tip of feeding tube is coiled in the proximal stomach. Electronically Signed   By: Vinnie Langton M.D.   On: 08/02/2022 08:07   CT ABDOMEN PELVIS WO CONTRAST  Result Date: 08/02/2022 CLINICAL DATA:  Nonlocalized acute abdomen pain. EXAM: CT ABDOMEN AND PELVIS WITHOUT CONTRAST TECHNIQUE: Multidetector CT imaging of the abdomen and pelvis was performed following the standard protocol without IV contrast. RADIATION DOSE  REDUCTION: This exam was performed according to the departmental dose-optimization program which includes automated exposure control, adjustment of the mA and/or kV according to patient size and/or use of iterative reconstruction technique. COMPARISON:  Abdomen single view 04/16/2022, noncontrast CT 01/26/2022, CT with IV contrast 11/21/2021, MRI abdomen 12/13/2020. FINDINGS: Lower chest: Lung bases again demonstrate scattered linear scarring or atelectasis. The cardiac size is normal. Small chronic pericardial effusion collecting to the left posteriorly and anteriorly again measure up to 1.3 cm. There are coronary artery calcifications. Mild chronic elevation right hemidiaphragm. Hepatobiliary: 18 cm length, mildly steatotic, otherwise unremarkable without contrast. Surgically absent gallbladder, as before. No biliary dilatation. Pancreas: Diffusely atrophic. Again noted is a 12 x 12 mm cystic lesion of -14 Hounsfield units in the uncinate process. The MRI demonstrated a 7 mm cystic lesion of the anterior body of the pancreas which is not seen on these images. The pancreas is otherwise unremarkable. Spleen: No focal abnormality without contrast.  No splenomegaly. Adrenals/Urinary Tract: There is no adrenal mass. Similar bilateral renal cysts. Largest is 9 cm off the inferior pole of the left kidney. No new abnormality. Perinephric fat stranding is unchanged. There is no urinary stone or obstruction. There is no bladder thickening. Stomach/Bowel: There are mild thickened folds in the stomach. The duodenum and jejunum are normal caliber. There are mildly dilated ileal segments in the anterior mid to lower abdomen up to 3.3 cm. These are interspersed with collapsed segments consistent with a partial small bowel obstruction, but a transitional segment could not be found. Etiology could be enteritis, occult adhesions or occult internal hernia. The appendix is normal. The large bowel unremarkable apart from scattered  uncomplicated diverticula, mild fecal stasis. Vascular/Lymphatic: Aortic atherosclerosis. No enlarged abdominal or pelvic lymph nodes. Reproductive:  No prostatomegaly. Other: There are small inguinal fat hernias, left larger. There is no incarcerated hernia. Multiple pelvic phleboliths. There is no free fluid, free hemorrhage or free air. Musculoskeletal: Osteopenia with mild degenerative changes of the thoracolumbar spine. Spurring SI joints. No concerning regional bone lesion. IMPRESSION: 1. Mildly dilated ileal segments in the anterior mid to lower abdomen, interspersed with collapsed segments, consistent with a partial small bowel obstruction, but a transitional segment could not be found. Etiology could be enteritis, occult adhesions or occult internal hernia. 2. Mildly thickened gastric folds consistent with gastritis. 3. Constipation and diverticulosis. 4. 12 x 12 mm cystic lesion in the uncinate process of the pancreas. The 7 mm cystic lesion of the anterior body of the pancreas on MRI is not seen on these images. Follow-up as indicated. Next follow-up study suggested in 1 year. 5. Aortic and coronary artery atherosclerosis. 6. Small chronic pericardial effusion. 7. Small inguinal fat hernias. 8. Osteopenia and degenerative change. Aortic Atherosclerosis (ICD10-I70.0). Electronically Signed   By: Telford Nab M.D.   On: 08/02/2022 03:04   DG Chest Port 1 View  Result Date: 07/27/2022 CLINICAL DATA:  Chest pain EXAM: PORTABLE CHEST 1 VIEW COMPARISON:  None Available. FINDINGS: The heart size and mediastinal contours are within normal limits. Both lungs are clear. The visualized skeletal structures are unremarkable. IMPRESSION: No active disease. Electronically Signed   By: Ulyses Jarred M.D.   On: 07/18/2022 23:25      Assessment/Plan Nausea, vomiting, enteritis vs psbo on top of chronic dysmotility issues The patient has been seen, examined, labs, vitals, I/Os, and imaging personally reviewed.   His CT scan does not show a transition point and he is already passing flatus.  We have a low suspicion for a small bowel obstruction, but could have a psbo vs enteritis.  He does have chronic bowel dysmotility that he takes reglan for each night.   He has had several admissions for ileus secondary to this as well in the past several years.  NGT has been placed and the SBO protocol is being performed to rule out complete obstruction.  This plan was discussed with he and his wife who is at bedside.  We will follow along.  FEN - NPO x ice, NGT, IVFs VTE - heparin for demand ischemia and NSTEMI, plavix/eliquis on hold ID - none currently needed  CAD NSTEMI - heparin x 48 hours CHF HTN HLD DM Bedbound Morbid obesity Chronic bowel dysmotility Peripheral neuropathy OSA  I reviewed Consultant cardiology notes, hospitalist notes, last 24 h vitals and pain scores, last 48 h intake and output, last 24 h labs and trends, and last 24 h imaging results.  Henreitta Cea, [facility] Surgery 08/02/2022, 11:31 AM Please see Amion for pager number during day hours 7:00am-4:30pm or 7:00am -11:30am on weekends

## 2022-08-02 NOTE — Progress Notes (Signed)
PROGRESS NOTE    Randy Liu  SWN:[id number] DOB: [birth year] DOA: 08/12/2022 PCP: [name], MD   Brief Narrative:  HPI: This is a 71 year old male with past medical history of DM2, DVT/PE on eliquis, HTN, HFrEF, ICM, multiple NSTEMIs over past several months.  11/17 he developed had an episode of nausea and vomiting.  Around 6:30 PM 11/18 he again started throwing up.  He took his Zofran, meclizine and his nighttime meds and everything came back up.  He had emesis all night.  He went to the pain between his shoulder blade, neck and shoulders.  He became diaphoretic.  911 was called.  He never had chest pains.   In the ER CT abdomen pelvis revealed small bowel obstruction.  Lab work revealed an initial troponin of 30, second troponin of 300.  EKG without acute ischemic changes.  Patient remains chest pain-free.   Cardiology contacted by ER physician via phone recommend ACS protocol.  They will see him in the a.m.   History provided by patient and wife who is present bedside.  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  No nausea.  Has good bowel sounds.  Continue NG tube with LIS.  General surgery consulted.  Management per them.  CAD with previous history of PCI/recovered ischemic cardiomyopathy/chronic heart failure with preserved ejection fraction NSTEMI: No chest pain or shortness of  breath.  Cardiology consulted, patient on heparin.  He is not hypoxic.  Lungs clear to auscultation.  Management per cardiology.  History of GI ulcers s/p duodenal polyp clipping: Continue IV Protonix.  Hemoglobin stable.  Essential hypertension: Controlled.  Continue current medications.  Dyslipidemia/statin intolerance: Fenofibrate on hold.  Stage IIIa CKD: Stable at baseline.  Sleep apnea on CPAP at night at home.  DVT prophylaxis: SCDs Start: 08/02/22 0454   Code Status: Full Code  Family Communication:  None present at bedside.  Plan of care discussed with patient in length and he/she verbalized understanding and agreed with it.  Status is: Inpatient Remains inpatient appropriate because: On heparin drip with NSTEMI, further evaluation pending by general surgery and cardiology.   Estimated body mass index is 36.81 kg/m as calculated from the following:   Height as of 06/05/22: 6' 1"  (1.854 m).   Weight as of 04/27/22: 126.6 kg.    Nutritional Assessment: There is no height or weight on file to calculate BMI.. Seen by dietician.  I agree with the assessment and plan as outlined below: Nutrition Status:        . Skin Assessment: I have examined the patient's skin and I agree with the wound assessment as performed by the wound care RN as outlined below:    Consultants:  Cardiology General surgery  Procedures:  As above  Antimicrobials:  Anti-infectives (From admission, onward)    None         Subjective: Patient seen and examined,  he states that he is feeling better than yesterday.  No chest pain, abdominal pain, shortness of breath, palpitation or any other complaint.  Positive flatus.  Objective: Vitals:   08/02/22 0600 08/02/22 0615 08/02/22 0626 08/02/22 0630  BP: 131/75 139/82 139/82 138/83  Pulse: 94 96 97 96  Resp: 19 18 17 19   Temp:   97.6 F (36.4 C)   TempSrc:   Oral   SpO2: 93% 94% 94% 93%    Intake/Output Summary (Last 24 hours) at  08/02/2022 4709 Last data filed at 08/02/2022 0745 Gross per 24 hour  Intake --  Output 300 ml  Net -300 ml   There were no vitals filed for this visit.  Examination:  General exam: Appears calm and comfortable, obese Respiratory system: Clear to auscultation. Respiratory effort normal. Cardiovascular system: S1 & S2 heard, RRR. No JVD, murmurs, rubs, gallops or clicks. No pedal edema. Gastrointestinal system: Abdomen is nondistended, soft and nontender. No organomegaly or masses felt. Normal bowel sounds heard. Central nervous system: Alert and oriented. No focal neurological deficits. Extremities: Symmetric 5 x 5 power. Skin: No rashes, lesions or ulcers Psychiatry: Judgement and insight appear normal. Mood & affect appropriate.    Data Reviewed: I have personally reviewed following labs and imaging studies  CBC: Recent Labs  Lab 08/02/22 0004 08/02/22 0630  WBC 11.2* 5.4  NEUTROABS 9.4* 3.3  HGB 14.5 14.0  HCT 45.8 44.6  MCV 94.2 94.7  PLT 270 628   Basic Metabolic Panel: Recent Labs  Lab 08/02/22 0004 08/02/22 0630  NA 139 138  K 4.5 4.7  CL 101 97*  CO2 21* 25  GLUCOSE 265* 255*  BUN 33* 34*  CREATININE 2.08* 2.09*  CALCIUM 10.0 9.9  MG  --  2.4   GFR: CrCl cannot be calculated (Unknown ideal weight.). Liver Function Tests: Recent Labs  Lab 08/02/22 0004 08/02/22 0630  AST 23 24  ALT 23 23  ALKPHOS 39 40  BILITOT 0.7 0.7  PROT 7.1 6.8  ALBUMIN 3.5 3.3*   Recent Labs  Lab 08/02/22 0004  LIPASE 24   No results for input(s): "AMMONIA" in the last 168 hours. Coagulation Profile: No results for input(s): "INR", "PROTIME" in the last 168 hours. Cardiac Enzymes: No results for input(s): "CKTOTAL", "CKMB", "CKMBINDEX", "TROPONINI" in the last 168 hours. BNP (last 3 results) No results for input(s): "PROBNP" in the last 8760 hours. HbA1C: No results for input(s): "HGBA1C" in the last 72 hours. CBG: No results for input(s): "GLUCAP" in the  last 168 hours. Lipid Profile: Recent Labs    08/02/22 0630  CHOL 151  HDL 26*  LDLCALC 83  TRIG 209*  CHOLHDL 5.8   Thyroid Function Tests: No results for input(s): "TSH", "T4TOTAL", "FREET4", "T3FREE", "THYROIDAB" in the last 72 hours. Anemia Panel: No results for input(s): "VITAMINB12", "FOLATE", "FERRITIN", "TIBC", "IRON", "RETICCTPCT" in the last 72 hours. Sepsis Labs: Recent Labs  Lab 08/02/22 0004  LATICACIDVEN 1.6    No results found for this or any previous visit (from the past 240 hour(s)).   Radiology Studies: CT ABDOMEN PELVIS WO CONTRAST  Result Date: 08/02/2022 CLINICAL DATA:  Nonlocalized acute abdomen pain. EXAM: CT ABDOMEN AND PELVIS WITHOUT CONTRAST TECHNIQUE: Multidetector CT imaging of the abdomen and pelvis was performed following the standard protocol without IV contrast. RADIATION DOSE REDUCTION: This exam was performed according to the departmental dose-optimization program which includes automated exposure control, adjustment of the mA and/or kV according to patient size and/or use  of iterative reconstruction technique. COMPARISON:  Abdomen single view 04/16/2022, noncontrast CT 01/26/2022, CT with IV contrast 11/21/2021, MRI abdomen 12/13/2020. FINDINGS: Lower chest: Lung bases again demonstrate scattered linear scarring or atelectasis. The cardiac size is normal. Small chronic pericardial effusion collecting to the left posteriorly and anteriorly again measure up to 1.3 cm. There are coronary artery calcifications. Mild chronic elevation right hemidiaphragm. Hepatobiliary: 18 cm length, mildly steatotic, otherwise unremarkable without contrast. Surgically absent gallbladder, as before. No biliary dilatation. Pancreas: Diffusely atrophic. Again noted is a 12 x 12 mm cystic lesion of -14 Hounsfield units in the uncinate process. The MRI demonstrated a 7 mm cystic lesion of the anterior body of the pancreas which is not seen on these images. The pancreas is  otherwise unremarkable. Spleen: No focal abnormality without contrast.  No splenomegaly. Adrenals/Urinary Tract: There is no adrenal mass. Similar bilateral renal cysts. Largest is 9 cm off the inferior pole of the left kidney. No new abnormality. Perinephric fat stranding is unchanged. There is no urinary stone or obstruction. There is no bladder thickening. Stomach/Bowel: There are mild thickened folds in the stomach. The duodenum and jejunum are normal caliber. There are mildly dilated ileal segments in the anterior mid to lower abdomen up to 3.3 cm. These are interspersed with collapsed segments consistent with a partial small bowel obstruction, but a transitional segment could not be found. Etiology could be enteritis, occult adhesions or occult internal hernia. The appendix is normal. The large bowel unremarkable apart from scattered uncomplicated diverticula, mild fecal stasis. Vascular/Lymphatic: Aortic atherosclerosis. No enlarged abdominal or pelvic lymph nodes. Reproductive: No prostatomegaly. Other: There are small inguinal fat hernias, left larger. There is no incarcerated hernia. Multiple pelvic phleboliths. There is no free fluid, free hemorrhage or free air. Musculoskeletal: Osteopenia with mild degenerative changes of the thoracolumbar spine. Spurring SI joints. No concerning regional bone lesion. IMPRESSION: 1. Mildly dilated ileal segments in the anterior mid to lower abdomen, interspersed with collapsed segments, consistent with a partial small bowel obstruction, but a transitional segment could not be found. Etiology could be enteritis, occult adhesions or occult internal hernia. 2. Mildly thickened gastric folds consistent with gastritis. 3. Constipation and diverticulosis. 4. 12 x 12 mm cystic lesion in the uncinate process of the pancreas. The 7 mm cystic lesion of the anterior body of the pancreas on MRI is not seen on these images. Follow-up as indicated. Next follow-up study suggested in 1  year. 5. Aortic and coronary artery atherosclerosis. 6. Small chronic pericardial effusion. 7. Small inguinal fat hernias. 8. Osteopenia and degenerative change. Aortic Atherosclerosis (ICD10-I70.0). Electronically Signed   By: Telford Nab M.D.   On: 08/02/2022 03:04   DG Chest Port 1 View  Result Date: 07/20/2022 CLINICAL DATA:  Chest pain EXAM: PORTABLE CHEST 1 VIEW COMPARISON:  None Available. FINDINGS: The heart size and mediastinal contours are within normal limits. Both lungs are clear. The visualized skeletal structures are unremarkable. IMPRESSION: No active disease. Electronically Signed   By: Ulyses Jarred M.D.   On: 08/10/2022 23:25    Scheduled Meds:  clopidogrel  75 mg Oral Daily   diatrizoate meglumine-sodium  90 mL Per NG tube Once   insulin aspart protamine- aspart  20 Units Subcutaneous BID WC   metoprolol tartrate  5 mg Intravenous Q8H   pantoprazole (PROTONIX) IV  40 mg Intravenous Q12H   Continuous Infusions:  sodium chloride 75 mL/hr at 08/02/22 0627   heparin       LOS: 0  days   Darliss Cheney, MD [facility]  08/02/2022, 8:07 AM   *Please note that this is a verbal dictation therefore any spelling or grammatical errors are due to the "[facility]" system interpretation.  Please page via [place] and do not message via secure chat for urgent patient care matters. Secure chat can be used for non urgent patient care matters.  How to contact the [facility] Attending or Consulting provider [place] or covering provider during after hours [place], for this patient?  Check the care team in [facility] and look for a) attending/consulting [facility] provider listed and b) the [facility] team listed. Page or secure chat [phone]. Log into [email or web address] and use [place]'s universal password to access. If you do not have the password, please contact the hospital operator. Locate the [facility] provider you are looking for under [facility] and page to a number that you can be directly  reached. If you still have difficulty reaching the provider, please page the [facility] (Director on Call) for the Hospitalists listed on amion for assistance.

## 2022-08-02 NOTE — H&P (Addendum)
PCP:   [name], MD   Chief Complaint:  Nausea and vomiting  HPI: This is a 71 year old male with past medical history of DM2, DVT/PE on eliquis, HTN, HFrEF, ICM, multiple NSTEMIs over past several months.  11/17 he developed had an episode of nausea and vomiting.  Around 6:30 PM 11/18 he again started throwing up.  He took his Zofran, meclizine and his nighttime meds and everything came back up.  He had emesis all night.  He went to the pain between his shoulder blade, neck and shoulders.  He became diaphoretic.  911 was called.  He never had chest pains.  In the ER CT abdomen pelvis revealed small bowel obstruction.  Lab work revealed an initial troponin of 30, second troponin of 300.  EKG without acute ischemic changes.  Patient remains chest pain-free.  Cardiology contacted by ER physician via phone recommend ACS protocol.  They will see him in the a.m.  History provided by patient and wife who is present bedside.  Review of Systems:  The patient denies anorexia, fever, weight loss,, vision loss, decreased hearing, hoarseness, syncope, dyspnea on exertion, peripheral edema, balance deficits, hemoptysis, abdominal pain, melena, hematochezia, severe indigestion/heartburn, hematuria, incontinence, genital sores, muscle weakness, suspicious skin lesions, transient blindness, difficulty walking, depression, unusual weight change, abnormal bleeding, enlarged lymph nodes, angioedema, and breast masses. Positive: Nausea, vomiting, diaphoresis  Past Medical History: Past Medical History:  Diagnosis Date   Anal fissure    Asthma    Cardiomyopathy ([place])    CHF (congestive heart failure) ([place])    Coronary artery disease    Diabetes mellitus without complication ([place])    History of DVT (deep vein thrombosis)    History of ulcer disease    Hyperlipidemia    Hypertension    Peripheral neuropathy    Sleep apnea    Past Surgical History:  Procedure Laterality Date   BIOPSY  12/14/2020    Procedure: BIOPSY;  Surgeon: Jackquline Denmark, MD;  Location: [facility];  Service: Endoscopy;;   BIOPSY  09/05/2021   Procedure: BIOPSY;  Surgeon: Irving Copas., MD;  Location: [place];  Service: Gastroenterology;;   CHOLECYSTECTOMY N/A 12/17/2020   Procedure: LAPAROSCOPIC CHOLECYSTECTOMY, LIVER BIOPSY, PRIMARY UMBILICAL HERNIA REPAIR;  Surgeon: Armandina Gemma, MD;  Location: [facility];  Service: General;  Laterality: N/A;   COLONOSCOPY WITH PROPOFOL N/A 05/08/2021   Procedure: COLONOSCOPY WITH PROPOFOL;  Surgeon: Carol Ada, MD;  Location: [facility];  Service: Endoscopy;  Laterality: N/A;   CORONARY STENT INTERVENTION N/A 11/21/2021   Procedure: CORONARY STENT INTERVENTION;  Surgeon: Nigel Mormon, MD;  Location: [place] [facility];  Service: Cardiovascular;  Laterality: N/A;   ENDOSCOPIC RETROGRADE CHOLANGIOPANCREATOGRAPHY (ERCP) WITH PROPOFOL N/A 12/14/2020   Procedure: ENDOSCOPIC RETROGRADE CHOLANGIOPANCREATOGRAPHY (ERCP) WITH PROPOFOL;  Surgeon: Jackquline Denmark, MD;  [facility];  Service: Endoscopy;  Laterality: N/A;   ESOPHAGOGASTRODUODENOSCOPY N/A 09/05/2021   Procedure: ESOPHAGOGASTRODUODENOSCOPY (EGD);  Surgeon: Irving Copas., MD;  Location: [place];  Service: Gastroenterology;  Laterality: N/A;   HEMOSTASIS CLIP PLACEMENT  09/05/2021   Procedure: HEMOSTASIS CLIP PLACEMENT;  Surgeon: Irving Copas., MD;  Location: [place];  Service: Gastroenterology;;   INTRAVASCULAR ULTRASOUND/IVUS N/A 11/21/2021   Procedure: Intravascular Ultrasound/IVUS;  Surgeon: Nigel Mormon, MD;  Location: [place] [facility];  Service: Cardiovascular;  Laterality: N/A;   LEFT HEART CATH AND CORONARY ANGIOGRAPHY N/A 08/12/2021   Procedure: LEFT HEART CATH AND CORONARY ANGIOGRAPHY;  Surgeon: Adrian Prows, MD;  [facility]  INV[facility];  Service: Cardiovascular;  Laterality: N/A;   LEFT HEART CATH AND CORONARY ANGIOGRAPHY N/A 11/21/2021   Procedure: LEFT  HEART CATH AND CORONARY ANGIOGRAPHY;  Surgeon: Nigel Mormon, MD;  Location: [place] [facility];  Service: Cardiovascular;  Laterality: N/A;   POLYPECTOMY  05/08/2021   Procedure: POLYPECTOMY;  Surgeon: Carol Ada, MD;  [facility];  Service: Endoscopy;;   POLYPECTOMY  09/05/2021   Procedure: POLYPECTOMY;  Surgeon: Irving Copas., MD;  Location: [place];  Service: Gastroenterology;;   REMOVAL OF STONES  12/14/2020   Procedure: REMOVAL OF STONES;  Surgeon: Jackquline Denmark, MD;  [facility];  Service: Endoscopy;;   SPHINCTEROTOMY  12/14/2020   Procedure: Joan Mayans;  Surgeon: Jackquline Denmark, MD;  [facility];  Service: Endoscopy;;    Medications: Prior to Admission medications   Medication Sig Start Date End Date Taking? Authorizing Provider  albuterol (VENTOLIN HFA) 108 (90 Base) MCG/ACT inhaler Inhale 2 puffs into the lungs every 6 (six) hours as needed for wheezing or shortness of breath.   Yes [provider]  Alpha-Lipoic Acid 300 MG TABS Take 600 mg by mouth daily.   Yes [provider]  benzonatate (TESSALON) 200 MG capsule Take 200 mg by mouth every 8 (eight) hours as needed for cough. 11/18/20  Yes [provider]  Cholecalciferol 25 MCG (1000 UT) capsule Take 4,000 Units by mouth daily.   Yes [provider]  clopidogrel (PLAVIX) 75 MG tablet TAKE ONE (1) TABLET BY MOUTH EVERY DAY 07/23/22  Yes Tolia, Sunit, DO  colchicine-probenecid 0.5-500 MG tablet Take 1 tablet by mouth daily. 11/21/20  Yes [provider]  cyanocobalamin (,VITAMIN B-12,) 1000 MCG/ML injection Inject 1,000 mcg into the skin every 30 (thirty) days. 09/23/20  Yes [provider]  ELIQUIS 5 MG TABS tablet Take 1 tablet (5 mg total) by mouth 2 (two) times daily. Resume on 05/11/21 Patient taking differently: Take 5 mg by mouth 2 (two) times daily. 05/11/21  Yes Kc, Maren Beach, MD  Evolocumab (REPATHA SURECLICK) 253 MG/ML  SOAJ Inject 1 mL into the skin every 14 (fourteen) days. 04/29/22  Yes Tolia, Sunit, DO  famotidine-calcium carbonate-magnesium hydroxide (PEPCID COMPLETE) 10-800-165 MG chewable tablet Chew 1 tablet by mouth daily as needed (heartburn).   Yes [provider]  fenofibrate 160 MG tablet Take 160 mg by mouth daily. 12/04/20  Yes [provider]  fluticasone (FLONASE) 50 MCG/ACT nasal spray Place 1 spray into both nostrils daily as needed for allergies. 07/08/21  Yes [provider]  guaiFENesin-codeine 100-10 MG/5ML syrup Take 5 mLs by mouth 3 (three) times daily as needed for cough. 04/07/22  Yes [provider]  insulin aspart protamine- aspart (NOVOLOG MIX 70/30) (70-30) 100 UNIT/ML injection Inject 40-85 Units into the skin 2 (two) times daily. Sliding scale:  100-140 : 40 units  141-180 : 45 units  181-220 : 50 units  221-260 : 55 units  261-300 : 60 units  301-340 : 65 units  341-380 : 70 units  381-420 : 75 units  421-460 : 80 units  461-500 : 85 units - recheck in 1 hour and notify MD   Yes [provider]  latanoprost (XALATAN) 0.005 % ophthalmic solution Place 1 drop into the left eye at bedtime. 10/29/20  Yes [provider]  loratadine (CLARITIN) 10 MG tablet Take 10 mg by mouth daily. 01/20/22  Yes [provider]  magnesium oxide (MAG-OX) 400 MG tablet Take 1  tablet (400 mg total) by mouth 3 (three) times daily. 03/18/22  Yes Tolia, Sunit, DO  meclizine (ANTIVERT) 25 MG tablet Take 25 mg by mouth 3 (three) times daily as needed for nausea.   Yes [provider]  melatonin 5 MG TABS Take 5 mg by mouth at bedtime.   Yes [provider]  metoCLOPramide (REGLAN) 5 MG tablet Take 5 mg by mouth at bedtime. 10/09/20  Yes [provider]  metoprolol succinate (TOPROL-XL) 25 MG 24 hr tablet Take 0.5 tablets (12.5 mg total) by mouth daily. 05/07/22  Yes Tolia, Sunit, DO  montelukast (SINGULAIR) 10 MG tablet Take  10 mg by mouth at bedtime. 09/28/20  Yes [provider]  nitroGLYCERIN (NITROSTAT) 0.4 MG SL tablet Place 0.4 mg under the tongue every 5 (five) minutes as needed for chest pain.   Yes [provider]  ondansetron (ZOFRAN-ODT) 4 MG disintegrating tablet Take 1 tablet (4 mg total) by mouth every 8 (eight) hours as needed for nausea or vomiting. 09/05/21  Yes Sharen Hones, MD  pantoprazole (PROTONIX) 40 MG tablet Take 1 tablet (40 mg total) by mouth 2 (two) times daily. 09/05/21  Yes Sharen Hones, MD  polyethylene glycol (MIRALAX / GLYCOLAX) 17 g packet Take 17 g by mouth 2 (two) times daily. Patient taking differently: Take 17 g by mouth 2 (two) times daily as needed for mild constipation. 11/27/21  Yes Pokhrel, Laxman, MD  Probiotic Product (ALIGN) 4 MG CAPS Take 4 mg by mouth daily.   Yes [provider]  senna (SENOKOT) 8.6 MG TABS tablet Take 1 tablet by mouth daily. 07/03/22  Yes [provider]  sucralfate (CARAFATE) 1 GM/10ML suspension Take 10 mLs (1 g total) by mouth 2 (two) times daily. 09/05/21  Yes Sharen Hones, MD  torsemide (DEMADEX) 10 MG tablet Take 0.5 tablets (5 mg total) by mouth daily. 04/15/22  Yes Tolia, Sunit, DO  VASCEPA 1 g capsule TAKE ONE TABLET BY MOUTH TWICE DAILY 04/07/22  Yes Tolia, Sunit, DO  sodium chloride (MURO 128) 5 % ophthalmic ointment Place 1 application  into both eyes at bedtime as needed for eye irritation.    [provider]    Allergies:   Allergies  Allergen Reactions   Nsaids Other (See Comments)    Renal Insufficiency Kidney issues    Reglan [Metoclopramide] Itching    Loss of Balance Urinary Incontinence Can tolerate low dose at bedtime   Augmentin [Amoxicillin-Pot Clavulanate] Diarrhea and Nausea And Vomiting    Abdomen pain    Statins Other (See Comments)    Myalgias Myositis     Chocolate Itching    Can tolerate if taking Benadryl   Other Other (See Comments)    All nuts - told to avoid  due to GI tract issues   Spiriva Respimat [Tiotropium Bromide Monohydrate] Other (See Comments)    Urinary retention   Strawberry (Diagnostic) Itching    Can tolerate if taking Benadryl   Jardiance [Empagliflozin] Other (See Comments)    Fainting Weakness  Lightheaded  Falling    Zyloprim [Allopurinol] Rash    Social History:  reports that he has never smoked. He has never used smokeless tobacco. He reports that he does not currently use alcohol. He reports that he does not use drugs.  Family History: Family History  Problem Relation Age of Onset   Heart failure Mother    Heart disease Mother    Cancer Father     Physical Exam: Vitals:  08/02/22 0145 08/02/22 0200 08/02/22 0245 08/02/22 0300  BP: 130/89 (!) 140/98  127/76  Pulse: 100 (!) 104 98 100  Resp: (!) 21 15 18 20   Temp:      TempSrc:      SpO2: 92% 93% 94% 93%    General:  Alert and oriented times three, morbidly obese, no acute distress Eyes: PERRLA, pink conjunctiva, no scleral icterus ENT: Dry oral mucosa, neck supple, no thyromegaly Lungs: clear to ascultation, no wheeze, no crackles, no use of accessory muscles Cardiovascular: regular rate and rhythm, no regurgitation, no gallops, no murmurs. No carotid bruits, no JVD Abdomen: soft, positive BS, area of mild tenderness right lower quadrant, non-distended, no organomegaly, not an acute abdomen GU: not examined Neuro: CN II - XII grossly intact, sensation intact Musculoskeletal: strength 5/5 all extremities, no clubbing, cyanosis or edema Skin: no rash, no subcutaneous crepitation, no decubitus.  Small circular area on chest, chronic nonhealing wound Psych: appropriate patient  Labs on Admission:  Recent Labs    08/02/22 0004  NA 139  K 4.5  CL 101  CO2 21*  GLUCOSE 265*  BUN 33*  CREATININE 2.08*  CALCIUM 10.0   Recent Labs    08/02/22 0004  AST 23  ALT 23  ALKPHOS 39  BILITOT 0.7  PROT 7.1  ALBUMIN 3.5   Recent Labs     08/02/22 0004  LIPASE 24   Recent Labs    08/02/22 0004  WBC 11.2*  NEUTROABS 9.4*  HGB 14.5  HCT 45.8  MCV 94.2  PLT 270      Radiological Exams on Admission: CT ABDOMEN PELVIS WO CONTRAST  Result Date: 08/02/2022 CLINICAL DATA:  Nonlocalized acute abdomen pain. EXAM: CT ABDOMEN AND PELVIS WITHOUT CONTRAST TECHNIQUE: Multidetector CT imaging of the abdomen and pelvis was performed following the standard protocol without IV contrast. RADIATION DOSE REDUCTION: This exam was performed according to the departmental dose-optimization program which includes automated exposure control, adjustment of the mA and/or kV according to patient size and/or use of iterative reconstruction technique. COMPARISON:  Abdomen single view 04/16/2022, noncontrast CT 01/26/2022, CT with IV contrast 11/21/2021, MRI abdomen 12/13/2020. FINDINGS: Lower chest: Lung bases again demonstrate scattered linear scarring or atelectasis. The cardiac size is normal. Small chronic pericardial effusion collecting to the left posteriorly and anteriorly again measure up to 1.3 cm. There are coronary artery calcifications. Mild chronic elevation right hemidiaphragm. Hepatobiliary: 18 cm length, mildly steatotic, otherwise unremarkable without contrast. Surgically absent gallbladder, as before. No biliary dilatation. Pancreas: Diffusely atrophic. Again noted is a 12 x 12 mm cystic lesion of -14 Hounsfield units in the uncinate process. The MRI demonstrated a 7 mm cystic lesion of the anterior body of the pancreas which is not seen on these images. The pancreas is otherwise unremarkable. Spleen: No focal abnormality without contrast.  No splenomegaly. Adrenals/Urinary Tract: There is no adrenal mass. Similar bilateral renal cysts. Largest is 9 cm off the inferior pole of the left kidney. No new abnormality. Perinephric fat stranding is unchanged. There is no urinary stone or obstruction. There is no bladder thickening. Stomach/Bowel:  There are mild thickened folds in the stomach. The duodenum and jejunum are normal caliber. There are mildly dilated ileal segments in the anterior mid to lower abdomen up to 3.3 cm. These are interspersed with collapsed segments consistent with a partial small bowel obstruction, but a transitional segment could not be found. Etiology could be enteritis, occult adhesions or occult internal hernia. The appendix is  normal. The large bowel unremarkable apart from scattered uncomplicated diverticula, mild fecal stasis. Vascular/Lymphatic: Aortic atherosclerosis. No enlarged abdominal or pelvic lymph nodes. Reproductive: No prostatomegaly. Other: There are small inguinal fat hernias, left larger. There is no incarcerated hernia. Multiple pelvic phleboliths. There is no free fluid, free hemorrhage or free air. Musculoskeletal: Osteopenia with mild degenerative changes of the thoracolumbar spine. Spurring SI joints. No concerning regional bone lesion. IMPRESSION: 1. Mildly dilated ileal segments in the anterior mid to lower abdomen, interspersed with collapsed segments, consistent with a partial small bowel obstruction, but a transitional segment could not be found. Etiology could be enteritis, occult adhesions or occult internal hernia. 2. Mildly thickened gastric folds consistent with gastritis. 3. Constipation and diverticulosis. 4. 12 x 12 mm cystic lesion in the uncinate process of the pancreas. The 7 mm cystic lesion of the anterior body of the pancreas on MRI is not seen on these images. Follow-up as indicated. Next follow-up study suggested in 1 year. 5. Aortic and coronary artery atherosclerosis. 6. Small chronic pericardial effusion. 7. Small inguinal fat hernias. 8. Osteopenia and degenerative change. Aortic Atherosclerosis (ICD10-I70.0). Electronically Signed   By: Telford Nab M.D.   On: 08/02/2022 03:04   DG Chest Port 1 View  Result Date: 07/28/2022 CLINICAL DATA:  Chest pain EXAM: PORTABLE CHEST 1  VIEW COMPARISON:  None Available. FINDINGS: The heart size and mediastinal contours are within normal limits. Both lungs are clear. The visualized skeletal structures are unremarkable. IMPRESSION: No active disease. Electronically Signed   By: Ulyses Jarred M.D.   On: 08/10/2022 23:25    Assessment/Plan Present on Admission:  SBO (small bowel obstruction) (HCC)  Nausea and vomiting -Admit to progressive care -ACS order set initiated -IV Zofran as needed -Currently patient pain-free however, morphine.   -NG tube to low intermittent wall suction -N.p.o., patient somewhat dehydrated, IV fluid hydration -Surgery consult for a.m. team  NSTEMI -IV heparin, rectal aspirin -IV labetalol with hold parameters -Cardiology consult placed, defer to cardiology if repeat 2D echo needed -Patient is statin intolerant. -Unable to do p.o. Plavix, fenofibrate this patient with SBO/NG tube in place.  Defer to GI/surgery/hospitalist if meds to be given NG tube clamped for specified time.  CAD status post PCI/ recovered ischemic cardiomyopathy/chronic HFpEF, -Per above -Patient maintained on Demadex 20 mg p.o. daily.  IV Lasix for a.m. team.  Patient with HFpEF  H/o GI ulcers status post duodenal polyp clipping -IV Protonix twice daily.  First dose now -No H/o GIB ,EGD last done 12/22. Two non-bleeding superficial gastric ulcers with a clean ulcer base.  -Patient maintained on Eliquis.  No GI bleeds.  Eliquis held on  IV heparin   Type 2 diabetes mellitus with hyperlipidemia (HCC)/gastroparesis -Sliding scale insulin -70/30 ordered, 25 units subcu.  twice daily.  Decrease dose with patient being n.p.o.   Essential hypertension -IV labetalol with hold parameters   Dyslipidemia/statin intolerance -Fenofibrate on hold   Stage 3a chronic kidney disease (CKD) (HCC) -Stable, at baseline  History of DVT  -Eliquis on hold, heparin drip on board  Sleep apnea on CPAP.  -Unable to do CPaP while patient  is NG tube in place   severe peripheral neuropathy,  Naureen Benton 08/02/2022, 3:59 AM

## 2022-08-02 NOTE — Inpatient Diabetes Management (Signed)
Inpatient Diabetes Program Recommendations  AACE/ADA: New Consensus Statement on Inpatient Glycemic Control (2015)  Target Ranges:  Prepandial:   less than 140 mg/dL      Peak postprandial:   less than 180 mg/dL (1-2 hours)      Critically ill patients:  140 - 180 mg/dL   Lab Results  Component Value Date   GLUCAP 267 (H) 08/02/2022   HGBA1C 7.4 (H) 04/16/2022    Review of Glycemic Control  Latest Reference Range & Units 04/18/22 16:50 04/18/22 20:16 04/18/22 22:47 04/19/22 09:01 08/02/22 10:23  Glucose-Capillary 70 - 99 mg/dL 222 (H) 243 (H) 244 (H) 269 (H) 267 (H)   Diabetes history: DM 2 Outpatient Diabetes medications: 70/30 sliding scale bid from 40-85 units Current orders for Inpatient glycemic control:  Semglee 25 units  A1c 7.4% on 8/3  Inpatient Diabetes Program Recommendations:    -  Add Novolog "very sensitive scale" 0-6 units tid + hs scale starting at 151 mg/dl.  Thanks,  Tama Headings RN, MSN, BC-ADM Inpatient Diabetes Coordinator Team Pager [phone] (8a-5p)

## 2022-08-03 ENCOUNTER — Inpatient Hospital Stay (HOSPITAL_COMMUNITY): Payer: Medicare Other

## 2022-08-03 DIAGNOSIS — I214 Non-ST elevation (NSTEMI) myocardial infarction: Secondary | ICD-10-CM | POA: Diagnosis not present

## 2022-08-03 LAB — BASIC METABOLIC PANEL
Anion gap: 15 (ref 5–15)
BUN: 38 mg/dL — ABNORMAL HIGH (ref 8–23)
CO2: 24 mmol/L (ref 22–32)
Calcium: 9.4 mg/dL (ref 8.9–10.3)
Chloride: 101 mmol/L (ref 98–111)
Creatinine, Ser: 1.99 mg/dL — ABNORMAL HIGH (ref 0.61–1.24)
GFR, Estimated: 35 mL/min — ABNORMAL LOW (ref >60)
Glucose, Bld: 202 mg/dL — ABNORMAL HIGH (ref 70–99)
Potassium: 4.6 mmol/L (ref 3.5–5.1)
Sodium: 140 mmol/L (ref 135–145)

## 2022-08-03 LAB — TROPONIN I (HIGH SENSITIVITY)
Troponin I (High Sensitivity): 702 ng/L (ref <18)
Troponin I (High Sensitivity): 540 ng/L

## 2022-08-03 LAB — CBC WITH DIFFERENTIAL/PLATELET
Abs Immature Granulocytes: 0.05 10*3/uL (ref 0.00–0.07)
Basophils Absolute: 0 10*3/uL (ref 0.0–0.1)
Basophils Relative: 1 %
Eosinophils Absolute: 0.1 10*3/uL (ref 0.0–0.5)
Eosinophils Relative: 1 %
HCT: 41.5 % (ref 39.0–52.0)
Hemoglobin: 13.5 g/dL (ref 13.0–17.0)
Immature Granulocytes: 1 %
Lymphocytes Relative: 33 %
Lymphs Abs: 2.4 10*3/uL (ref 0.7–4.0)
MCH: 31 pg (ref 26.0–34.0)
MCHC: 32.5 g/dL (ref 30.0–36.0)
MCV: 95.2 fL (ref 80.0–100.0)
Monocytes Absolute: 0.8 10*3/uL (ref 0.1–1.0)
Monocytes Relative: 12 %
Neutro Abs: 3.7 10*3/uL (ref 1.7–7.7)
Neutrophils Relative %: 52 %
Platelets: 245 10*3/uL (ref 150–400)
RBC: 4.36 MIL/uL (ref 4.22–5.81)
RDW: 16 % — ABNORMAL HIGH (ref 11.5–15.5)
WBC: 7.1 10*3/uL (ref 4.0–10.5)
nRBC: 0 % (ref 0.0–0.2)

## 2022-08-03 LAB — GLUCOSE, CAPILLARY
Glucose-Capillary: 126 mg/dL — ABNORMAL HIGH (ref 70–99)
Glucose-Capillary: 128 mg/dL — ABNORMAL HIGH (ref 70–99)
Glucose-Capillary: 159 mg/dL — ABNORMAL HIGH (ref 70–99)
Glucose-Capillary: 163 mg/dL — ABNORMAL HIGH (ref 70–99)
Glucose-Capillary: 184 mg/dL — ABNORMAL HIGH (ref 70–99)

## 2022-08-03 LAB — APTT: aPTT: 94 seconds — ABNORMAL HIGH (ref 24–36)

## 2022-08-03 LAB — MAGNESIUM: Magnesium: 2.4 mg/dL (ref 1.7–2.4)

## 2022-08-03 LAB — HEPARIN LEVEL (UNFRACTIONATED): Heparin Unfractionated: >1.1 IU/mL — ABNORMAL HIGH (ref 0.30–0.70)

## 2022-08-03 MED ORDER — LATANOPROST 0.005 % OP SOLN
1.0000 [drp] | Freq: Every day | OPHTHALMIC | Status: DC
  Administered 2022-08-03 – 2022-08-14 (×11): 1 [drp] via OPHTHALMIC
  Filled 2022-08-03: qty 2.5

## 2022-08-03 MED ORDER — DIATRIZOATE MEGLUMINE & SODIUM 66-10 % PO SOLN
90.0000 mL | Freq: Once | ORAL | Status: AC
  Administered 2022-08-03: 90 mL via NASOGASTRIC
  Filled 2022-08-03: qty 90

## 2022-08-03 MED ORDER — INSULIN ASPART 100 UNIT/ML IJ SOLN
0.0000 [IU] | INTRAMUSCULAR | Status: DC
  Administered 2022-08-03 (×2): 2 [IU] via SUBCUTANEOUS
  Administered 2022-08-03 – 2022-08-05 (×11): 1 [IU] via SUBCUTANEOUS
  Administered 2022-08-06: 2 [IU] via SUBCUTANEOUS
  Administered 2022-08-07 – 2022-08-08 (×2): 1 [IU] via SUBCUTANEOUS
  Administered 2022-08-08 (×2): 2 [IU] via SUBCUTANEOUS
  Administered 2022-08-09: 1 [IU] via SUBCUTANEOUS
  Administered 2022-08-09: 2 [IU] via SUBCUTANEOUS
  Administered 2022-08-09: 1 [IU] via SUBCUTANEOUS
  Administered 2022-08-09 (×2): 2 [IU] via SUBCUTANEOUS
  Administered 2022-08-09: 1 [IU] via SUBCUTANEOUS
  Administered 2022-08-09: 2 [IU] via SUBCUTANEOUS
  Administered 2022-08-10 (×3): 1 [IU] via SUBCUTANEOUS
  Administered 2022-08-11: 2 [IU] via SUBCUTANEOUS
  Administered 2022-08-11 – 2022-08-12 (×5): 1 [IU] via SUBCUTANEOUS
  Administered 2022-08-12: 2 [IU] via SUBCUTANEOUS
  Administered 2022-08-12: 1 [IU] via SUBCUTANEOUS
  Administered 2022-08-13: 3 [IU] via SUBCUTANEOUS
  Administered 2022-08-13: 2 [IU] via SUBCUTANEOUS
  Administered 2022-08-13: 5 [IU] via SUBCUTANEOUS
  Administered 2022-08-13 (×2): 2 [IU] via SUBCUTANEOUS
  Administered 2022-08-14: 1 [IU] via SUBCUTANEOUS
  Administered 2022-08-14 (×2): 2 [IU] via SUBCUTANEOUS

## 2022-08-03 MED ORDER — PHENOL 1.4 % MT LIQD
1.0000 | OROMUCOSAL | Status: DC | PRN
  Filled 2022-08-03 (×2): qty 177

## 2022-08-03 NOTE — Progress Notes (Signed)
Pt refuses CPAP 

## 2022-08-03 NOTE — Progress Notes (Signed)
ANTICOAGULATION CONSULT NOTE - Initial Consult  Pharmacy Consult for Heparin (Apixaban on hold) Indication: chest pain/ACS, history of VTE  Allergies  Allergen Reactions   Nsaids Other (See Comments)    Renal Insufficiency Kidney issues    Augmentin [Amoxicillin-Pot Clavulanate] Diarrhea and Nausea And Vomiting    Abdomen pain    Statins Other (See Comments)    Myalgias Myositis     Chocolate Itching    Can tolerate if taking Benadryl   Other Other (See Comments)    All nuts - told to avoid due to GI tract issues   Spiriva Respimat [Tiotropium Bromide Monohydrate] Other (See Comments)    Urinary retention   Strawberry (Diagnostic) Itching    Can tolerate if taking Benadryl   Jardiance [Empagliflozin] Other (See Comments)    Fainting Weakness  Lightheaded  Falling    Zyloprim [Allopurinol] Rash      Vital Signs: Temp: 97.6 F (36.4 C) (11/20 0429) Temp Source: Oral (11/20 0429) BP: 126/69 (11/20 0429) Pulse Rate: 76 (11/20 0429)  Labs: Recent Labs    08/02/22 0004 08/02/22 0156 08/02/22 0630 08/02/22 1739 08/03/22 0046 08/03/22 0627  HGB 14.5  --  14.0  --  13.5  --   HCT 45.8  --  44.6  --  41.5  --   PLT 270  --  261  --  245  --   APTT  --   --   --  75*  --  94*  HEPARINUNFRC  --   --   --  >1.10*  --  >1.10*  CREATININE 2.08*  --  2.09*  --  1.99*  --   TROPONINIHS 30* 200*  --   --   --   --     Estimated Creatinine Clearance: 46.2 mL/min (A) (by C-G formula based on SCr of 1.99 mg/dL (H)).   Medical History: Past Medical History:  Diagnosis Date   Anal fissure    Asthma    Cardiomyopathy ([place])    CHF (congestive heart failure) (HCC)    Coronary artery disease    Diabetes mellitus without complication ([place])    History of DVT (deep vein thrombosis)    History of ulcer disease    Hyperlipidemia    Hypertension    Peripheral neuropathy    Sleep apnea     Assessment: 71 y/o M on apixaban PTA for history incidental bilateral PE in  2020 admitted with NSTEMI and enteritis vs partial SBO. Last dose of apixaban was 11/18 at 2130. Patient is bedbound. Pharmacy consulted for heparin.   Per cardiology, holding plavix for now with plan for heparin for 48 hours. If no further chest pain, then discontinue heparin and if no surgery, then can restart plavix.   Only 1 PIV available, where NS is also running.  Nurse noted the IV heparin line has normal saline running in with it.  APTT 94 is at high end of therapeutic on 1400 units/hr. Heparin level is still high and not correlating with APTT. CBC stable.  Goal of Therapy:  Heparin level 0.3-0.7 units/ml aPTT 66-102 seconds Monitor platelets by anticoagulation protocol: Yes   Plan:  Decrease heparin drip slightly to 1350 units/hr to keep in goal  F/u aPTT until correlates with heparin level  Monitor daily aPTT, heparin level, CBC Monitor for signs/symptoms of bleeding    Benetta Spar, PharmD, BCPS, BCCP Clinical Pharmacist  Please check AMION for all [place] phone numbers After 10:00 PM, call [place]  [phone]   

## 2022-08-03 NOTE — Progress Notes (Addendum)
PROGRESS NOTE    Randy Liu  ZOX:[id number] DOB: 1950/12/22 DOA: 08/12/2022 PCP: [name], MD   Brief Narrative:  HPI: This is a 71 year old male with past medical history of DM2, DVT/PE on eliquis, HTN, HFrEF, ICM, multiple NSTEMIs over past several months.  11/17 he developed had an episode of nausea and vomiting.  Around 6:30 PM 11/18 he again started throwing up.  He took his Zofran, meclizine and his nighttime meds and everything came back up.  He had emesis all night.  He went to the pain between his shoulder blade, neck and shoulders.  He became diaphoretic.  911 was called.  He never had chest pains.   In the ER CT abdomen pelvis revealed small bowel obstruction.  Lab work revealed an initial troponin of 30, second troponin of 300.  EKG without acute ischemic changes.  Patient remains chest pain-free.   Cardiology contacted by ER physician via phone recommend ACS protocol.  They will see him in the a.m.   History provided by patient and wife who is present bedside.  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  Still passing flatus but no bowel movement.  No nausea.  NG tube to suction.  CAD with previous history of PCI/recovered ischemic cardiomyopathy/chronic heart failure with preserved ejection fraction /NSTEMI vs [place] ischemia: No chest  pain or shortness of breath.  Cardiology consulted, patient is asymptomatic and is on heparin, will discontinue heparin after 48 hours as cardiology has recommended.  Per cardiology, patient is not intervention candidate.  Wife at the bedside concerned about troponins, she is requesting to check more troponins, will do that.  History of GI ulcers s/p duodenal polyp clipping: Continue IV Protonix.  Hemoglobin stable.  Essential hypertension: Controlled.  Continue current medications.  Type 2 diabetes mellitus: Blood sugar controlled.  Continue Semglee 25 units.  Add SSI.  Dyslipidemia/statin intolerance: Fenofibrate on hold.  Stage IIIa CKD: Stable at baseline.  Sleep apnea on CPAP at night at home.  DVT prophylaxis: SCDs Start: 08/02/22 0454   Code Status: Full Code  Family Communication: Wife present at bedside.  Plan of care discussed with patient in length and he/she verbalized understanding and agreed with it.  Status is: Inpatient Remains inpatient appropriate because: Still with SBO.   Estimated body mass index is 36.88 kg/m as calculated from the following:   Height as of this encounter: 6' 1"  (1.854 m).   Weight as of this encounter: 126.8 kg.    Nutritional Assessment: Body mass index is 36.88 kg/m.Marland Kitchen Seen by dietician.  I agree with the assessment and plan as outlined below: Nutrition Status:        . Skin Assessment: I have examined the patient's skin and I agree with the wound assessment as performed by the wound care RN as outlined below:    Consultants:  Cardiology General surgery  Procedures:  As above  Antimicrobials:  Anti-infectives (From admission, onward)    None         Subjective:  Seen and examined.  Feels better.  Passing flatus.  No bowel movement and no nausea. Objective: Vitals:   08/03/22 0015 08/03/22 0429 08/03/22 0729 08/03/22 1056  BP: 138/69 126/69 123/69 (!) 142/76  Pulse: 74 76 68 78  Resp: 16 15 18 19   Temp: 97.6 F  (36.4 C) 97.6 F (36.4 C) 98.1 F (36.7 C) 98 F (36.7 C)  TempSrc: Oral Oral Oral Oral  SpO2: 95% 93% 96% 95%  Weight: 126.8 kg     Height:        Intake/Output Summary (Last 24 hours) at 08/03/2022 1110 Last data filed at 08/03/2022 1057 Gross per 24 hour  Intake 751.12 ml  Output 425 ml  Net 326.12 ml    Filed Weights   08/02/22 1543 08/03/22 0015  Weight: 126.9 kg 126.8 kg    Examination:  General exam: Appears calm and comfortable  Respiratory system: Clear to auscultation. Respiratory effort normal. Cardiovascular system: S1 & S2 heard, RRR. No JVD, murmurs, rubs, gallops or clicks. No pedal edema. Gastrointestinal system: Abdomen is nondistended, soft and nontender. No organomegaly or masses felt.  Diminished bowel sounds heard. Central nervous system: Alert and oriented. No focal neurological deficits. Extremities: Symmetric 5 x 5 power. Skin: No rashes, lesions or ulcers.  Psychiatry: Judgement and insight appear normal. Mood & affect appropriate.   Data Reviewed: I have personally reviewed following labs and imaging studies  CBC: Recent Labs  Lab 08/02/22 0004 08/02/22 0630 08/03/22 0046  WBC 11.2* 5.4 7.1  NEUTROABS 9.4* 3.3 3.7  HGB 14.5 14.0 13.5  HCT 45.8 44.6 41.5  MCV 94.2 94.7 95.2  PLT 270 261 962    Basic Metabolic Panel: Recent Labs  Lab 08/02/22 0004 08/02/22 0630 08/03/22 0046  NA 139 138 140  K 4.5 4.7 4.6  CL 101 97* 101  CO2 21* 25 24  GLUCOSE 265* 255* 202*  BUN 33* 34* 38*  CREATININE 2.08* 2.09* 1.99*  CALCIUM 10.0 9.9 9.4  MG  --  2.4 2.4    GFR: Estimated Creatinine Clearance: 46.2 mL/min (A) (by C-G formula based on SCr of 1.99 mg/dL (H)). Liver Function Tests: Recent Labs  Lab 08/02/22 0004 08/02/22 0630  AST 23 24  ALT 23 23  ALKPHOS 39 40  BILITOT 0.7 0.7  PROT 7.1 6.8  ALBUMIN 3.5 3.3*    Recent Labs  Lab 08/02/22 0004  LIPASE 24    No results for input(s): "AMMONIA" in the last 168  hours. Coagulation Profile: No results for input(s): "INR", "PROTIME" in the last 168 hours. Cardiac Enzymes: No results for input(s): "CKTOTAL", "CKMB", "CKMBINDEX", "TROPONINI" in the last 168 hours. BNP (last 3 results) No results for input(s): "PROBNP" in the last 8760 hours. HbA1C: No results for input(s): "HGBA1C" in the last 72 hours. CBG: Recent Labs  Lab 08/02/22 1023 08/02/22 1602 08/02/22 2108 08/03/22 0619 08/03/22 1054  GLUCAP 267* 259* 204* 163* 184*   Lipid Profile: Recent Labs    08/02/22 0630  CHOL 151  HDL 26*  LDLCALC 83  TRIG 209*  CHOLHDL 5.8    Thyroid Function Tests: No results for input(s): "TSH", "T4TOTAL", "FREET4", "T3FREE", "THYROIDAB" in the last 72 hours. Anemia Panel: No results for input(s): "VITAMINB12", "FOLATE", "FERRITIN", "TIBC", "IRON", "RETICCTPCT" in the last 72 hours. Sepsis Labs: Recent Labs  Lab 08/02/22 0004  LATICACIDVEN 1.6     No results found for this or any previous visit (from the past 240 hour(s)).   Radiology Studies: DG Abd Portable 1V-Small Bowel Obstruction Protocol-initial, 8 hr delay  Result Date: 08/02/2022 CLINICAL DATA:  Small bowel obstruction, 8 hour postcontrast image EXAM: PORTABLE ABDOMEN - 1 VIEW COMPARISON:  08/02/2022 at 0745 hours FINDINGS: Enteric tube terminates in the gastric cardia. Trace contrast in the gastric cardia. However, there is no contrast in the remainder of the stomach or small bowel. Dilated loops of small bowel in the central abdomen, compatible small bowel obstruction. Cholecystectomy clips. IMPRESSION: Trace contrast in the gastric cardia. No contrast in the remainder of the stomach or small bowel. Correlate with timing of contrast administration and whether the patient is still on suction. Dilated loops of small bowel in the central abdomen, compatible with small bowel obstruction. Electronically Signed   By: Julian Hy M.D.   On: 08/02/2022 20:13   DG Abd Portable 1  View  Result Date: 08/02/2022 CLINICAL DATA:  71 year old male status post nasogastric tube placement. EXAM: PORTABLE ABDOMEN - 1 VIEW COMPARISON:  04/16/2022. FINDINGS: Enteric tube in position with tip in the proximal stomach. Visualized bowel gas pattern is unremarkable. Surgical clips project over the right upper quadrant of the abdomen, likely from prior cholecystectomy. Inferior abdomen and pelvis are incompletely imaged. IMPRESSION: 1. Tip of feeding tube is coiled in the proximal stomach. Electronically Signed   By: Vinnie Langton M.D.   On: 08/02/2022 08:07   CT ABDOMEN PELVIS WO CONTRAST  Result Date: 08/02/2022 CLINICAL DATA:  Nonlocalized acute abdomen pain. EXAM: CT ABDOMEN AND PELVIS WITHOUT CONTRAST TECHNIQUE: Multidetector CT imaging of the abdomen and pelvis was performed following the standard protocol without IV contrast. RADIATION DOSE REDUCTION: This exam was performed according to the departmental dose-optimization program which includes automated exposure control, adjustment of the mA and/or kV according to patient size and/or use of iterative reconstruction technique. COMPARISON:  Abdomen single view 04/16/2022, noncontrast CT 01/26/2022, CT with IV contrast 11/21/2021, MRI abdomen 12/13/2020. FINDINGS: Lower chest: Lung bases again demonstrate scattered linear scarring or atelectasis. The cardiac size is normal. Small chronic pericardial effusion collecting to the left posteriorly and anteriorly again measure up to 1.3 cm. There are coronary artery calcifications. Mild chronic elevation right hemidiaphragm. Hepatobiliary: 18 cm length, mildly steatotic, otherwise unremarkable without contrast. Surgically absent gallbladder, as before. No biliary dilatation. Pancreas: Diffusely atrophic. Again noted is a 12 x 12 mm cystic lesion of -14 Hounsfield units in the uncinate process. The MRI demonstrated a 7 mm cystic lesion of the anterior body of the pancreas which is not seen on these  images. The pancreas is otherwise unremarkable. Spleen: No focal abnormality without contrast.  No splenomegaly. Adrenals/Urinary Tract: There is no adrenal mass. Similar bilateral renal cysts. Largest is 9 cm off the inferior pole of the left kidney. No new abnormality. Perinephric fat stranding is unchanged. There is no urinary stone or obstruction. There is no bladder thickening. Stomach/Bowel: There are mild thickened folds in the stomach. The duodenum and jejunum are normal caliber. There are mildly dilated ileal segments in the anterior mid to lower abdomen up to 3.3 cm. These are interspersed with collapsed segments consistent with a partial small bowel obstruction, but a transitional segment could not be found. Etiology could be enteritis, occult adhesions or occult internal hernia. The appendix is normal. The large bowel unremarkable apart from scattered uncomplicated diverticula, mild fecal stasis. Vascular/Lymphatic: Aortic atherosclerosis. No  enlarged abdominal or pelvic lymph nodes. Reproductive: No prostatomegaly. Other: There are small inguinal fat hernias, left larger. There is no incarcerated hernia. Multiple pelvic phleboliths. There is no free fluid, free hemorrhage or free air. Musculoskeletal: Osteopenia with mild degenerative changes of the thoracolumbar spine. Spurring SI joints. No concerning regional bone lesion. IMPRESSION: 1. Mildly dilated ileal segments in the anterior mid to lower abdomen, interspersed with collapsed segments, consistent with a partial small bowel obstruction, but a transitional segment could not be found. Etiology could be enteritis, occult adhesions or occult internal hernia. 2. Mildly thickened gastric folds consistent with gastritis. 3. Constipation and diverticulosis. 4. 12 x 12 mm cystic lesion in the uncinate process of the pancreas. The 7 mm cystic lesion of the anterior body of the pancreas on MRI is not seen on these images. Follow-up as indicated. Next  follow-up study suggested in 1 year. 5. Aortic and coronary artery atherosclerosis. 6. Small chronic pericardial effusion. 7. Small inguinal fat hernias. 8. Osteopenia and degenerative change. Aortic Atherosclerosis (ICD10-I70.0). Electronically Signed   By: Telford Nab M.D.   On: 08/02/2022 03:04   DG Chest Port 1 View  Result Date: 07/26/2022 CLINICAL DATA:  Chest pain EXAM: PORTABLE CHEST 1 VIEW COMPARISON:  None Available. FINDINGS: The heart size and mediastinal contours are within normal limits. Both lungs are clear. The visualized skeletal structures are unremarkable. IMPRESSION: No active disease. Electronically Signed   By: Ulyses Jarred M.D.   On: 07/23/2022 23:25    Scheduled Meds:  insulin aspart  0-9 Units Subcutaneous Q4H   insulin glargine-yfgn  25 Units Subcutaneous Daily   metoprolol tartrate  5 mg Intravenous Q8H   pantoprazole (PROTONIX) IV  40 mg Intravenous Q12H   Continuous Infusions:  sodium chloride 75 mL/hr at 08/03/22 0834   heparin 1,350 Units/hr (08/03/22 0728)     LOS: 1 day   Darliss Cheney, MD [facility]  08/03/2022, 11:10 AM   *Please note that this is a verbal dictation therefore any spelling or grammatical errors are due to the "[place] [employer]" system interpretation.  Please page via [place] and do not message via secure chat for urgent patient care matters. Secure chat can be used for non urgent patient care matters.  How to contact the [facility] Attending or Consulting provider [place] or covering provider during after hours [place], for this patient?  Check the care team in [facility] and look for a) attending/consulting [facility] provider listed and b) the [facility] team listed. Page or secure chat [phone]. Log into [email or web address] and use [place]'s universal password to access. If you do not have the password, please contact the hospital operator. Locate the [facility] provider you are looking for under [facility] and page to a number that you can be directly  reached. If you still have difficulty reaching the provider, please page the [facility] (Director on Call) for the Hospitalists listed on amion for assistance.

## 2022-08-03 NOTE — Progress Notes (Signed)
[facility] Progress Note     Subjective: CC-  Wife at bedside. Patient having minimal central abdominal discomfort, improved since admission. Passed flatus yesterday, none so far today. No BM. NG with 550cc output. 8hr xray showed trace contrast in stomach, persistently dilated loops of small bowel in the central abdomen.  Objective: Vital signs in last 24 hours: Temp:  [97.5 F (36.4 C)-98.1 F (36.7 C)] 98 F (36.7 C) (11/20 1056) Pulse Rate:  [68-92] 78 (11/20 1056) Resp:  [14-20] 19 (11/20 1056) BP: (115-144)/(69-115) 142/76 (11/20 1056) SpO2:  [93 %-96 %] 95 % (11/20 1056) Weight:  [126.8 kg-126.9 kg] 126.8 kg (11/20 0015) Last BM Date : 07/29/22  Intake/Output from previous day: 11/19 0701 - 11/20 0700 In: 751.1 [I.V.:691.1; NG/GT:60] Out: 650 [Urine:100; Emesis/NG output:550] Intake/Output this shift: Total I/O In: -  Out: 75 [Urine:75]  PE: Gen:  Alert, NAD, pleasant Abd: obese, soft, minimal central abdominal TTP without rebound or guarding  Lab Results:  Recent Labs    08/02/22 0630 08/03/22 0046  WBC 5.4 7.1  HGB 14.0 13.5  HCT 44.6 41.5  PLT 261 245   BMET Recent Labs    08/02/22 0630 08/03/22 0046  NA 138 140  K 4.7 4.6  CL 97* 101  CO2 25 24  GLUCOSE 255* 202*  BUN 34* 38*  CREATININE 2.09* 1.99*  CALCIUM 9.9 9.4   PT/INR No results for input(s): "LABPROT", "INR" in the last 72 hours. CMP     Component Value Date/Time   NA 140 08/03/2022 0046   K 4.6 08/03/2022 0046   CL 101 08/03/2022 0046   CO2 24 08/03/2022 0046   GLUCOSE 202 (H) 08/03/2022 0046   BUN 38 (H) 08/03/2022 0046   CREATININE 1.99 (H) 08/03/2022 0046   CALCIUM 9.4 08/03/2022 0046   PROT 6.8 08/02/2022 0630   ALBUMIN 3.3 (L) 08/02/2022 0630   AST 24 08/02/2022 0630   ALT 23 08/02/2022 0630   ALKPHOS 40 08/02/2022 0630   BILITOT 0.7 08/02/2022 0630   GFRNONAA 35 (L) 08/03/2022 0046   Lipase     Component Value Date/Time   LIPASE 24 08/02/2022  0004       Studies/Results: DG Abd Portable 1V-Small Bowel Obstruction Protocol-initial, 8 hr delay  Result Date: 08/02/2022 CLINICAL DATA:  Small bowel obstruction, 8 hour postcontrast image EXAM: PORTABLE ABDOMEN - 1 VIEW COMPARISON:  08/02/2022 at 0745 hours FINDINGS: Enteric tube terminates in the gastric cardia. Trace contrast in the gastric cardia. However, there is no contrast in the remainder of the stomach or small bowel. Dilated loops of small bowel in the central abdomen, compatible small bowel obstruction. Cholecystectomy clips. IMPRESSION: Trace contrast in the gastric cardia. No contrast in the remainder of the stomach or small bowel. Correlate with timing of contrast administration and whether the patient is still on suction. Dilated loops of small bowel in the central abdomen, compatible with small bowel obstruction. Electronically Signed   By: Julian Hy M.D.   On: 08/02/2022 20:13   DG Abd Portable 1 View  Result Date: 08/02/2022 CLINICAL DATA:  71 year old male status post nasogastric tube placement. EXAM: PORTABLE ABDOMEN - 1 VIEW COMPARISON:  04/16/2022. FINDINGS: Enteric tube in position with tip in the proximal stomach. Visualized bowel gas pattern is unremarkable. Surgical clips project over the right upper quadrant of the abdomen, likely from prior cholecystectomy. Inferior abdomen and pelvis are incompletely imaged. IMPRESSION: 1. Tip of feeding tube is coiled in the proximal stomach.  Electronically Signed   By: Vinnie Langton M.D.   On: 08/02/2022 08:07   CT ABDOMEN PELVIS WO CONTRAST  Result Date: 08/02/2022 CLINICAL DATA:  Nonlocalized acute abdomen pain. EXAM: CT ABDOMEN AND PELVIS WITHOUT CONTRAST TECHNIQUE: Multidetector CT imaging of the abdomen and pelvis was performed following the standard protocol without IV contrast. RADIATION DOSE REDUCTION: This exam was performed according to the departmental dose-optimization program which includes automated  exposure control, adjustment of the mA and/or kV according to patient size and/or use of iterative reconstruction technique. COMPARISON:  Abdomen single view 04/16/2022, noncontrast CT 01/26/2022, CT with IV contrast 11/21/2021, MRI abdomen 12/13/2020. FINDINGS: Lower chest: Lung bases again demonstrate scattered linear scarring or atelectasis. The cardiac size is normal. Small chronic pericardial effusion collecting to the left posteriorly and anteriorly again measure up to 1.3 cm. There are coronary artery calcifications. Mild chronic elevation right hemidiaphragm. Hepatobiliary: 18 cm length, mildly steatotic, otherwise unremarkable without contrast. Surgically absent gallbladder, as before. No biliary dilatation. Pancreas: Diffusely atrophic. Again noted is a 12 x 12 mm cystic lesion of -14 Hounsfield units in the uncinate process. The MRI demonstrated a 7 mm cystic lesion of the anterior body of the pancreas which is not seen on these images. The pancreas is otherwise unremarkable. Spleen: No focal abnormality without contrast.  No splenomegaly. Adrenals/Urinary Tract: There is no adrenal mass. Similar bilateral renal cysts. Largest is 9 cm off the inferior pole of the left kidney. No new abnormality. Perinephric fat stranding is unchanged. There is no urinary stone or obstruction. There is no bladder thickening. Stomach/Bowel: There are mild thickened folds in the stomach. The duodenum and jejunum are normal caliber. There are mildly dilated ileal segments in the anterior mid to lower abdomen up to 3.3 cm. These are interspersed with collapsed segments consistent with a partial small bowel obstruction, but a transitional segment could not be found. Etiology could be enteritis, occult adhesions or occult internal hernia. The appendix is normal. The large bowel unremarkable apart from scattered uncomplicated diverticula, mild fecal stasis. Vascular/Lymphatic: Aortic atherosclerosis. No enlarged abdominal or  pelvic lymph nodes. Reproductive: No prostatomegaly. Other: There are small inguinal fat hernias, left larger. There is no incarcerated hernia. Multiple pelvic phleboliths. There is no free fluid, free hemorrhage or free air. Musculoskeletal: Osteopenia with mild degenerative changes of the thoracolumbar spine. Spurring SI joints. No concerning regional bone lesion. IMPRESSION: 1. Mildly dilated ileal segments in the anterior mid to lower abdomen, interspersed with collapsed segments, consistent with a partial small bowel obstruction, but a transitional segment could not be found. Etiology could be enteritis, occult adhesions or occult internal hernia. 2. Mildly thickened gastric folds consistent with gastritis. 3. Constipation and diverticulosis. 4. 12 x 12 mm cystic lesion in the uncinate process of the pancreas. The 7 mm cystic lesion of the anterior body of the pancreas on MRI is not seen on these images. Follow-up as indicated. Next follow-up study suggested in 1 year. 5. Aortic and coronary artery atherosclerosis. 6. Small chronic pericardial effusion. 7. Small inguinal fat hernias. 8. Osteopenia and degenerative change. Aortic Atherosclerosis (ICD10-I70.0). Electronically Signed   By: Telford Nab M.D.   On: 08/02/2022 03:04   DG Chest Port 1 View  Result Date: 07/28/2022 CLINICAL DATA:  Chest pain EXAM: PORTABLE CHEST 1 VIEW COMPARISON:  None Available. FINDINGS: The heart size and mediastinal contours are within normal limits. Both lungs are clear. The visualized skeletal structures are unremarkable. IMPRESSION: No active disease. Electronically Signed  By: Ulyses Jarred M.D.   On: 08/13/2022 23:25    Anti-infectives: Anti-infectives (From admission, onward)    None        Assessment/Plan Nausea, vomiting, enteritis vs psbo on top of chronic dysmotility issues   - 8hr xray showed trace contrast in stomach, persistently dilated loops of small bowel in the central abdomen - Abdominal  pain improved, passed flatus yesterday but none today and no BM yet. Will repeat film this morning. If similar will repeat gastrograffin, if improved will attempt NG clamping trial.   FEN - NPO x ice, NGT, IVFs VTE - heparin gtt for demand ischemia and NSTEMI, plavix/eliquis on hold ID - none currently needed   CAD NSTEMI - heparin x 48 hours CHF HTN HLD DM Bedbound Morbid obesity Chronic bowel dysmotility Peripheral neuropathy OSA   I reviewed Consultant cardiology notes, hospitalist notes, last 24 h vitals and pain scores, last 48 h intake and output, last 24 h labs and trends, and last 24 h imaging results.    LOS: 1 day    Wellington Hampshire, [facility] 08/03/2022, 11:36 AM Please see Amion for pager number during day hours 7:00am-4:30pm

## 2022-08-04 ENCOUNTER — Inpatient Hospital Stay (HOSPITAL_COMMUNITY): Payer: Medicare Other

## 2022-08-04 DIAGNOSIS — I214 Non-ST elevation (NSTEMI) myocardial infarction: Secondary | ICD-10-CM | POA: Diagnosis not present

## 2022-08-04 LAB — GLUCOSE, CAPILLARY
Glucose-Capillary: 126 mg/dL — ABNORMAL HIGH (ref 70–99)
Glucose-Capillary: 134 mg/dL — ABNORMAL HIGH (ref 70–99)
Glucose-Capillary: 139 mg/dL — ABNORMAL HIGH (ref 70–99)
Glucose-Capillary: 128 mg/dL — ABNORMAL HIGH (ref 70–99)
Glucose-Capillary: 147 mg/dL — ABNORMAL HIGH (ref 70–99)

## 2022-08-04 LAB — BASIC METABOLIC PANEL
Anion gap: 11 (ref 5–15)
BUN: 35 mg/dL — ABNORMAL HIGH (ref 8–23)
CO2: 29 mmol/L (ref 22–32)
Calcium: 9.5 mg/dL (ref 8.9–10.3)
Chloride: 103 mmol/L (ref 98–111)
Creatinine, Ser: 1.87 mg/dL — ABNORMAL HIGH (ref 0.61–1.24)
GFR, Estimated: 37 mL/min — ABNORMAL LOW (ref >60)
Glucose, Bld: 133 mg/dL — ABNORMAL HIGH (ref 70–99)
Potassium: 4.2 mmol/L (ref 3.5–5.1)
Sodium: 143 mmol/L (ref 135–145)

## 2022-08-04 LAB — APTT: aPTT: 78 seconds — ABNORMAL HIGH (ref 24–36)

## 2022-08-04 LAB — HEPARIN LEVEL (UNFRACTIONATED): Heparin Unfractionated: >1.1 IU/mL — ABNORMAL HIGH (ref 0.30–0.70)

## 2022-08-04 MED ORDER — BISACODYL 10 MG RE SUPP
10.0000 mg | Freq: Once | RECTAL | Status: AC
  Administered 2022-08-04: 10 mg via RECTAL
  Filled 2022-08-04: qty 1

## 2022-08-04 MED ORDER — BISACODYL 10 MG RE SUPP
10.0000 mg | Freq: Once | RECTAL | Status: DC
  Filled 2022-08-04: qty 1

## 2022-08-04 MED ORDER — PERFLUTREN LIPID MICROSPHERE
1.0000 mL | INTRAVENOUS | Status: AC | PRN
  Administered 2022-08-04: 2 mL via INTRAVENOUS

## 2022-08-04 NOTE — Progress Notes (Signed)
[place] for Heparin (Apixaban on hold) Indication: chest pain/ACS, history of VTE   Labs: Recent Labs    08/02/22 0004 08/02/22 0156 08/02/22 0630 08/02/22 1739 08/03/22 0046 08/03/22 0627 08/03/22 1210 08/04/22 0039  HGB 14.5  --  14.0  --  13.5  --   --   --   HCT 45.8  --  44.6  --  41.5  --   --   --   PLT 270  --  261  --  245  --   --   --   APTT  --   --   --  75*  --  94*  --  78*  HEPARINUNFRC  --   --   --  >1.10*  --  >1.10*  --  >1.10*  CREATININE 2.08*  --  2.09*  --  1.99*  --   --  1.87*  TROPONINIHS 30* 200*  --   --   --  702* 540*  --      Estimated Creatinine Clearance: 48.9 mL/min (A) (by C-G formula based on SCr of 1.87 mg/dL (H)).  Assessment: 71 y/o M on apixaban PTA for history incidental bilateral PE in 2020 admitted with NSTEMI and enteritis vs partial SBO. Last dose of apixaban was 11/18 at 2130. Patient is bedbound. Pharmacy consulted for heparin.   Only 1 PIV available, where NS is also running.  Nurse noted the IV heparin line has normal saline running in with it. Appt 78 seconds, therapeutic. Heparin level is still high and not correlating with APTT. CBC stable.  Goal of Therapy:  Heparin level 0.3-0.7 units/ml aPTT 66-102 seconds Monitor platelets by anticoagulation protocol: Yes   Plan:  Continue heparin drip at 1350 units/hr to keep in goal  F/u aPTT until correlates with heparin level  Monitor daily aPTT, heparin level, CBC Monitor for signs/symptoms of bleeding   Thank you Anette Guarneri, PharmD

## 2022-08-04 NOTE — Progress Notes (Signed)
Orthopedic Tech Progress Note Patient Details:  Randy Liu Mar 23, 1951 [id number]  Ortho Devices Type of Ortho Device: Haematologist Ortho Device/Splint Location: BLE Ortho Device/Splint Interventions: Ordered, Application   Post Interventions Patient Tolerated: Well  Chip Boer 08/04/2022, 6:23 PM

## 2022-08-04 NOTE — Progress Notes (Signed)
PROGRESS NOTE    Randy Liu  ZOX:[id number] DOB: 01-10-1951 DOA: 07/24/2022 PCP: [name], MD   Brief Narrative:  HPI: This is a 71 year old male with past medical history of DM2, DVT/PE on eliquis, HTN, HFrEF, ICM, multiple NSTEMIs over past several months.  11/17 he developed had an episode of nausea and vomiting.  Around 6:30 PM 11/18 he again started throwing up.  He took his Zofran, meclizine and his nighttime meds and everything came back up.  He had emesis all night.  He went to the pain between his shoulder blade, neck and shoulders.  He became diaphoretic.  911 was called.  He never had chest pains.   In the ER CT abdomen pelvis revealed small bowel obstruction.  Lab work revealed an initial troponin of 30, second troponin of 300.  EKG without acute ischemic changes.  Patient remains chest pain-free.   Cardiology contacted by ER physician via phone recommend ACS protocol.  They will see him in the a.m.   History provided by patient and wife who is present bedside.  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  NG tube to suction.  Passing flatus but no bowel movement.  General surgery on board and managing.  CAD with previous history of PCI/recovered ischemic cardiomyopathy/chronic heart failure with  preserved ejection fraction /NSTEMI vs Demand ischemia: No chest pain or shortness of breath.  Cardiology consulted, patient is asymptomatic and is on heparin which I will discontinue today since it has been 48 hours, as cardiology recommended.  Cardiac enzymes however went up to 700 yesterday.  Cardiology had reviewed him and basically mentioned that patient is not candidate for any intervention and signed off.  Will check transthoracic echo.  If wall motion abnormality, will reconsult cardiology.  History of GI ulcers s/p duodenal polyp clipping: Continue IV Protonix.  Hemoglobin stable.  Essential hypertension: Controlled.  Continue current medications.  Type 2 diabetes mellitus: Blood sugar controlled.  Continue Semglee 25 units.  Add SSI.  Dyslipidemia/statin intolerance: Fenofibrate on hold.  Stage IIIa CKD: Stable at baseline.  Sleep apnea on CPAP at night at home.  DVT prophylaxis: SCDs Start: 08/02/22 0454   Code Status: Full Code  Family Communication: Wife present at bedside.  Plan of care discussed with patient in length and he/she verbalized understanding and agreed with it.  Status is: Inpatient Remains inpatient appropriate because: Still with SBO.   Estimated body mass index is 36.59 kg/m as calculated from the following:   Height as of this encounter: 6' 1"  (1.854 m).   Weight as of this encounter: 125.8 kg.    Nutritional Assessment: Body mass index is 36.59 kg/m.Marland Kitchen Seen by dietician.  I agree with the assessment and plan as outlined below: Nutrition Status:        . Skin Assessment: I have  examined the patient's skin and I agree with the wound assessment as performed by the wound care RN as outlined below:    Consultants:  Cardiology General surgery  Procedures:  As above  Antimicrobials:  Anti-infectives (From admission, onward)    None         Subjective:  Patient seen and examined.  No new complaint.  Passing flatus but no bowel  movement. Objective: Vitals:   08/03/22 1543 08/03/22 1926 08/03/22 2335 08/04/22 0358  BP: 124/69 133/68 133/67 (!) 128/91  Pulse: 66 68 70 74  Resp: 17 16 15 15   Temp: 98.1 F (36.7 C) 97.7 F (36.5 C) 97.8 F (36.6 C) 98 F (36.7 C)  TempSrc: Oral  Oral Oral  SpO2: 94% 97% 95% 94%  Weight:    125.8 kg  Height:        Intake/Output Summary (Last 24 hours) at 08/04/2022 1026 Last data filed at 08/04/2022 0300 Gross per 24 hour  Intake 700 ml  Output 575 ml  Net 125 ml    Filed Weights   08/02/22 1543 08/03/22 0015 08/04/22 0358  Weight: 126.9 kg 126.8 kg 125.8 kg    Examination:  General exam: Appears calm and comfortable, obese Respiratory system: Clear to auscultation. Respiratory effort normal. Cardiovascular system: S1 & S2 heard, RRR. No JVD, murmurs, rubs, gallops or clicks. No pedal edema. Gastrointestinal system: Abdomen is nondistended, soft and nontender. No organomegaly or masses felt.  Diminished bowel sounds. Central nervous system: Alert and oriented. No focal neurological deficits. Extremities: Symmetric 5 x 5 power. Skin: No rashes, lesions or ulcers.  Psychiatry: Judgement and insight appear normal. Mood & affect appropriate.    Data Reviewed: I have personally reviewed following labs and imaging studies  CBC: Recent Labs  Lab 08/02/22 0004 08/02/22 0630 08/03/22 0046  WBC 11.2* 5.4 7.1  NEUTROABS 9.4* 3.3 3.7  HGB 14.5 14.0 13.5  HCT 45.8 44.6 41.5  MCV 94.2 94.7 95.2  PLT 270 261 295    Basic Metabolic Panel: Recent Labs  Lab 08/02/22 0004 08/02/22 0630 08/03/22 0046 08/04/22 0039  NA 139 138 140 143  K 4.5 4.7 4.6 4.2  CL 101 97* 101 103  CO2 21* 25 24 29   GLUCOSE 265* 255* 202* 133*  BUN 33* 34* 38* 35*  CREATININE 2.08* 2.09* 1.99* 1.87*  CALCIUM 10.0 9.9 9.4 9.5  MG  --  2.4 2.4  --     GFR: Estimated Creatinine Clearance: 48.9 mL/min (A) (by C-G formula based on SCr of 1.87 mg/dL (H)). Liver Function  Tests: Recent Labs  Lab 08/02/22 0004 08/02/22 0630  AST 23 24  ALT 23 23  ALKPHOS 39 40  BILITOT 0.7 0.7  PROT 7.1 6.8  ALBUMIN 3.5 3.3*    Recent Labs  Lab 08/02/22 0004  LIPASE 24    No results for input(s): "AMMONIA" in the last 168 hours. Coagulation Profile: No results for input(s): "INR", "PROTIME" in the last 168 hours. Cardiac Enzymes: No results for input(s): "CKTOTAL", "CKMB", "CKMBINDEX", "TROPONINI" in the last 168 hours. BNP (last 3 results) No results for input(s): "PROBNP" in the last 8760 hours. HbA1C: No results for input(s): "HGBA1C" in the last 72 hours. CBG: Recent Labs  Lab 08/03/22 1541 08/03/22 2014 08/03/22 2336 08/04/22 0354 08/04/22 0719  GLUCAP 159* 128* 126* 126* 134*    Lipid Profile: Recent Labs    08/02/22 0630  CHOL 151  HDL 26*  LDLCALC 83  TRIG 209*  CHOLHDL  5.8    Thyroid Function Tests: No results for input(s): "TSH", "T4TOTAL", "FREET4", "T3FREE", "THYROIDAB" in the last 72 hours. Anemia Panel: No results for input(s): "VITAMINB12", "FOLATE", "FERRITIN", "TIBC", "IRON", "RETICCTPCT" in the last 72 hours. Sepsis Labs: Recent Labs  Lab 08/02/22 0004  LATICACIDVEN 1.6     No results found for this or any previous visit (from the past 240 hour(s)).   Radiology Studies: DG Abd Portable 1V-Small Bowel Obstruction Protocol-initial, 8 hr delay  Result Date: 08/04/2022 CLINICAL DATA:  8 hour small-bowel delayed film. EXAM: PORTABLE ABDOMEN - 1 VIEW COMPARISON:  Abdominal x-ray 08/03/2022. CT abdomen and pelvis 08/02/2022. FINDINGS: Oral contrast is seen throughout dilated small bowel loops measuring up to 5 cm. There is a small amount of contrast within the transverse colon. Colon is nondilated. No suspicious calcifications. IMPRESSION: 1. Findings compatible with partial small bowel obstruction. Small bowel loops remain dilated, but contrast is seen to the level of the transverse colon. Electronically Signed   By: Ronney Asters M.D.   On: 08/04/2022 01:24   DG Abd Portable 1V  Result Date: 08/03/2022 CLINICAL DATA:  Small bowel obstruction. EXAM: PORTABLE ABDOMEN - 1 VIEW COMPARISON:  August 02, 2022. FINDINGS: Mildly dilated small bowel loops are noted concerning for ileus or distal small bowel obstruction. No colonic dilatation is noted. IMPRESSION: Mildly dilated small bowel loops are noted concerning for ileus or distal small bowel obstruction. Electronically Signed   By: Marijo Conception M.D.   On: 08/03/2022 12:23   DG Abd Portable 1V-Small Bowel Obstruction Protocol-initial, 8 hr delay  Result Date: 08/02/2022 CLINICAL DATA:  Small bowel obstruction, 8 hour postcontrast image EXAM: PORTABLE ABDOMEN - 1 VIEW COMPARISON:  08/02/2022 at 0745 hours FINDINGS: Enteric tube terminates in the gastric cardia. Trace contrast in the gastric cardia. However, there is no contrast in the remainder of the stomach or small bowel. Dilated loops of small bowel in the central abdomen, compatible small bowel obstruction. Cholecystectomy clips. IMPRESSION: Trace contrast in the gastric cardia. No contrast in the remainder of the stomach or small bowel. Correlate with timing of contrast administration and whether the patient is still on suction. Dilated loops of small bowel in the central abdomen, compatible with small bowel obstruction. Electronically Signed   By: Julian Hy M.D.   On: 08/02/2022 20:13    Scheduled Meds:  insulin aspart  0-9 Units Subcutaneous Q4H   insulin glargine-yfgn  25 Units Subcutaneous Daily   latanoprost  1 drop Left Eye QHS   metoprolol tartrate  5 mg Intravenous Q8H   pantoprazole (PROTONIX) IV  40 mg Intravenous Q12H   Continuous Infusions:  sodium chloride 75 mL/hr at 08/03/22 2150     LOS: 2 days   Darliss Cheney, MD [facility]  08/04/2022, 10:26 AM   *Please note that this is a verbal dictation therefore any spelling or grammatical errors are due to the "[place]  [employer]" system interpretation.  Please page via [place] and do not message via secure chat for urgent patient care matters. Secure chat can be used for non urgent patient care matters.  How to contact the [facility] Attending or Consulting provider [place] or covering provider during after hours [place], for this patient?  Check the care team in [facility] and look for a) attending/consulting [facility] provider listed and b) the [facility] team listed. Page or secure chat [phone]. Log into [email or web address] and use [place]'s universal password to access. If you do not  have the password, please contact the hospital operator. Locate the [facility] provider you are looking for under [facility] and page to a number that you can be directly reached. If you still have difficulty reaching the provider, please page the [facility] (Director on Call) for the Hospitalists listed on amion for assistance.

## 2022-08-04 NOTE — Progress Notes (Signed)
Pt refuses CPAP 

## 2022-08-04 NOTE — Progress Notes (Signed)
[facility] Progress Note     Subjective: CC-  Wife at bedside. Patient having minimal central abdominal discomfort. No worsened distention. No flatus or BM. NGT output lighter in color today. Repeat protocol ordered yesterday and 8h delay showed contrast in transverse colon.   Objective: Vital signs in last 24 hours: Temp:  [97.7 F (36.5 C)-98.3 F (36.8 C)] 98.3 F (36.8 C) (11/21 1042) Pulse Rate:  [66-74] 69 (11/21 1042) Resp:  [15-20] 18 (11/21 1042) BP: (106-133)/(60-91) 126/64 (11/21 1042) SpO2:  [94 %-97 %] 96 % (11/21 1042) Weight:  [125.8 kg] 125.8 kg (11/21 0358) Last BM Date : 07/29/22  Intake/Output from previous day: 11/20 0701 - 11/21 0700 In: 700 [I.V.:700] Out: 575 [Urine:575] Intake/Output this shift: Total I/O In: -  Out: 350 [Urine:350]  PE: Gen:  Alert, NAD, pleasant Abd: obese, soft, minimal central abdominal TTP without rebound or guarding, NGT with thin drainage that seems less bilious   Lab Results:  Recent Labs    08/02/22 0630 08/03/22 0046  WBC 5.4 7.1  HGB 14.0 13.5  HCT 44.6 41.5  PLT 261 245    BMET Recent Labs    08/03/22 0046 08/04/22 0039  NA 140 143  K 4.6 4.2  CL 101 103  CO2 24 29  GLUCOSE 202* 133*  BUN 38* 35*  CREATININE 1.99* 1.87*  CALCIUM 9.4 9.5    PT/INR No results for input(s): "LABPROT", "INR" in the last 72 hours. CMP     Component Value Date/Time   NA 143 08/04/2022 0039   K 4.2 08/04/2022 0039   CL 103 08/04/2022 0039   CO2 29 08/04/2022 0039   GLUCOSE 133 (H) 08/04/2022 0039   BUN 35 (H) 08/04/2022 0039   CREATININE 1.87 (H) 08/04/2022 0039   CALCIUM 9.5 08/04/2022 0039   PROT 6.8 08/02/2022 0630   ALBUMIN 3.3 (L) 08/02/2022 0630   AST 24 08/02/2022 0630   ALT 23 08/02/2022 0630   ALKPHOS 40 08/02/2022 0630   BILITOT 0.7 08/02/2022 0630   GFRNONAA 37 (L) 08/04/2022 0039   Lipase     Component Value Date/Time   LIPASE 24 08/02/2022 0004       Studies/Results: DG  Abd Portable 1V-Small Bowel Obstruction Protocol-initial, 8 hr delay  Result Date: 08/04/2022 CLINICAL DATA:  8 hour small-bowel delayed film. EXAM: PORTABLE ABDOMEN - 1 VIEW COMPARISON:  Abdominal x-ray 08/03/2022. CT abdomen and pelvis 08/02/2022. FINDINGS: Oral contrast is seen throughout dilated small bowel loops measuring up to 5 cm. There is a small amount of contrast within the transverse colon. Colon is nondilated. No suspicious calcifications. IMPRESSION: 1. Findings compatible with partial small bowel obstruction. Small bowel loops remain dilated, but contrast is seen to the level of the transverse colon. Electronically Signed   By: Ronney Asters M.D.   On: 08/04/2022 01:24   DG Abd Portable 1V  Result Date: 08/03/2022 CLINICAL DATA:  Small bowel obstruction. EXAM: PORTABLE ABDOMEN - 1 VIEW COMPARISON:  August 02, 2022. FINDINGS: Mildly dilated small bowel loops are noted concerning for ileus or distal small bowel obstruction. No colonic dilatation is noted. IMPRESSION: Mildly dilated small bowel loops are noted concerning for ileus or distal small bowel obstruction. Electronically Signed   By: Marijo Conception M.D.   On: 08/03/2022 12:23   DG Abd Portable 1V-Small Bowel Obstruction Protocol-initial, 8 hr delay  Result Date: 08/02/2022 CLINICAL DATA:  Small bowel obstruction, 8 hour postcontrast image EXAM: PORTABLE ABDOMEN - 1 VIEW  COMPARISON:  08/02/2022 at 0745 hours FINDINGS: Enteric tube terminates in the gastric cardia. Trace contrast in the gastric cardia. However, there is no contrast in the remainder of the stomach or small bowel. Dilated loops of small bowel in the central abdomen, compatible small bowel obstruction. Cholecystectomy clips. IMPRESSION: Trace contrast in the gastric cardia. No contrast in the remainder of the stomach or small bowel. Correlate with timing of contrast administration and whether the patient is still on suction. Dilated loops of small bowel in the central  abdomen, compatible with small bowel obstruction. Electronically Signed   By: Julian Hy M.D.   On: 08/02/2022 20:13    Anti-infectives: Anti-infectives (From admission, onward)    None        Assessment/Plan Nausea, vomiting, enteritis vs psbo on top of chronic dysmotility issues   - SBO protocol repeated yesterday, 8h delay with contrast in transverse colon but still some dilated loops of small bowel  - no bowel function clinically yet - try suppository today - keep K >4.0 and Mg >2.0 to optimize bowel function  - repeat film at 24 hrs, if passing flatus later today or significant improvement in film maybe consider clamping NGT and Clears - no indication for emergent surgical intervention   FEN - NPO x ice, NGT, IVFs VTE - heparin gtt for demand ischemia and NSTEMI, plavix/eliquis on hold ID - none currently needed   CAD NSTEMI - heparin x 48 hours CHF HTN HLD DM Bedbound Morbid obesity Chronic bowel dysmotility Peripheral neuropathy OSA   I reviewed Consultant cardiology notes, hospitalist notes, last 24 h vitals and pain scores, last 48 h intake and output, last 24 h labs and trends, and last 24 h imaging results.    LOS: 2 days    Norm Parcel, [facility] 08/04/2022, 11:29 AM Please see Amion for pager number during day hours 7:00am-4:30pm

## 2022-08-04 NOTE — TOC Progression Note (Signed)
Transition of Care [facility]) - Progression Note    Patient Details  Name: Randy Liu MRN: [id number] Date of Birth: 1951-04-21  Transition of [facility]) CM/SW Contact  Zenon Mayo, RN Phone Number: 08/04/2022, 10:17 AM  Clinical Narrative:    NSTEMI , SBO , NG tube to suction conts on hep drip, echo pending, npo, bed bound, form home with wife. TOC following.        Expected Discharge Plan and Services                                                 Social Determinants of Health (SDOH) Interventions    Readmission Risk Interventions    04/19/2022   10:21 AM 11/25/2021    3:43 PM 09/02/2021    3:35 PM  Readmission Risk Prevention Plan  Transportation Screening Complete Complete Complete  PCP or Specialist Appt within 3-5 Days Complete    HRI or [place] Complete    Social Work Consult for [place] Planning/Counseling Complete    Palliative Care Screening Not Applicable    Medication Review [employer]) Complete  Complete  HRI or Home Care Consult  Complete Complete  SW Recovery Care/Counseling Consult  Complete Complete  Palliative Care Screening   Not [place]  Complete Not Applicable

## 2022-08-04 NOTE — Progress Notes (Signed)
  Echocardiogram 2D Echocardiogram has been performed.  [name] 08/04/2022, 10:44 AM

## 2022-08-04 NOTE — Progress Notes (Signed)
Patient experienced nausea and emesis. Assessed patient to find NG tube partially dislodged with anchor tape still attached to tubing. Reinserted and reinforced to previous placement. Physician notified. Intermittent suction paused at this time.

## 2022-08-04 NOTE — Progress Notes (Signed)
Patient had an extra large bowel movement this evening post the suppository. Soft in consistency and brown in color.

## 2022-08-05 ENCOUNTER — Inpatient Hospital Stay (HOSPITAL_COMMUNITY): Payer: Medicare Other

## 2022-08-05 DIAGNOSIS — I214 Non-ST elevation (NSTEMI) myocardial infarction: Secondary | ICD-10-CM | POA: Diagnosis not present

## 2022-08-05 LAB — GLUCOSE, CAPILLARY
Glucose-Capillary: 114 mg/dL — ABNORMAL HIGH (ref 70–99)
Glucose-Capillary: 115 mg/dL — ABNORMAL HIGH (ref 70–99)
Glucose-Capillary: 120 mg/dL — ABNORMAL HIGH (ref 70–99)
Glucose-Capillary: 132 mg/dL — ABNORMAL HIGH (ref 70–99)
Glucose-Capillary: 133 mg/dL — ABNORMAL HIGH (ref 70–99)
Glucose-Capillary: 137 mg/dL — ABNORMAL HIGH (ref 70–99)
Glucose-Capillary: 137 mg/dL — ABNORMAL HIGH (ref 70–99)
Glucose-Capillary: 147 mg/dL — ABNORMAL HIGH (ref 70–99)

## 2022-08-05 LAB — APTT: aPTT: 29 seconds (ref 24–36)

## 2022-08-05 LAB — BASIC METABOLIC PANEL
Anion gap: 17 — ABNORMAL HIGH (ref 5–15)
BUN: 28 mg/dL — ABNORMAL HIGH (ref 8–23)
CO2: 23 mmol/L (ref 22–32)
Calcium: 9.3 mg/dL (ref 8.9–10.3)
Chloride: 105 mmol/L (ref 98–111)
Creatinine, Ser: 1.48 mg/dL — ABNORMAL HIGH (ref 0.61–1.24)
GFR, Estimated: 50 mL/min — ABNORMAL LOW (ref >60)
Glucose, Bld: 129 mg/dL — ABNORMAL HIGH (ref 70–99)
Potassium: 4 mmol/L (ref 3.5–5.1)
Sodium: 145 mmol/L (ref 135–145)

## 2022-08-05 NOTE — Progress Notes (Signed)
Patient declines CPAP. No unit in room at this time.

## 2022-08-05 NOTE — Progress Notes (Signed)
[facility] Progress Note     Subjective: CC-  Wife at bedside. Patient having minimal central abdominal discomfort. No worsened distention. Had a BM yesterday.   Objective: Vital signs in last 24 hours: Temp:  [97.5 F (36.4 C)-97.9 F (36.6 C)] 97.9 F (36.6 C) (11/22 0758) Pulse Rate:  [60-81] 69 (11/22 0839) Resp:  [13-19] 15 (11/22 0839) BP: (116-151)/(55-72) 116/55 (11/22 0839) SpO2:  [93 %-97 %] 95 % (11/22 0839) Weight:  [129.2 kg] 129.2 kg (11/22 0407) Last BM Date : 08/04/22  Intake/Output from previous day: 11/21 0701 - 11/22 0700 In: 90 [NG/GT:90] Out: 2950 [Urine:800; Emesis/NG output:2150] Intake/Output this shift: Total I/O In: 54 [P.O.:20; NG/GT:60] Out: 575 [Urine:200; Emesis/NG output:375]  PE: Gen:  Alert, NAD, pleasant Abd: obese, soft, minimal central abdominal TTP without rebound or guarding, NGT with thin drainage  Lab Results:  Recent Labs    08/03/22 0046  WBC 7.1  HGB 13.5  HCT 41.5  PLT 245    BMET Recent Labs    08/04/22 0039 08/05/22 0111  NA 143 145  K 4.2 4.0  CL 103 105  CO2 29 23  GLUCOSE 133* 129*  BUN 35* 28*  CREATININE 1.87* 1.48*  CALCIUM 9.5 9.3    PT/INR No results for input(s): "LABPROT", "INR" in the last 72 hours. CMP     Component Value Date/Time   NA 145 08/05/2022 0111   K 4.0 08/05/2022 0111   CL 105 08/05/2022 0111   CO2 23 08/05/2022 0111   GLUCOSE 129 (H) 08/05/2022 0111   BUN 28 (H) 08/05/2022 0111   CREATININE 1.48 (H) 08/05/2022 0111   CALCIUM 9.3 08/05/2022 0111   PROT 6.8 08/02/2022 0630   ALBUMIN 3.3 (L) 08/02/2022 0630   AST 24 08/02/2022 0630   ALT 23 08/02/2022 0630   ALKPHOS 40 08/02/2022 0630   BILITOT 0.7 08/02/2022 0630   GFRNONAA 50 (L) 08/05/2022 0111   Lipase     Component Value Date/Time   LIPASE 24 08/02/2022 0004       Studies/Results: DG Abd Portable 1V  Result Date: 08/05/2022 CLINICAL DATA:  NG tube EXAM: PORTABLE ABDOMEN - 1 VIEW  COMPARISON:  Abdominal x-ray 08/04/2022 FINDINGS: Nasogastric tube tip is in the mid/proximal stomach, unchanged from prior. No dilated bowel loops. There surgical clips in the right abdomen. IMPRESSION: Nasogastric tube tip is in the mid/proximal stomach. Electronically Signed   By: Ronney Asters M.D.   On: 08/05/2022 00:44   DG Abd Portable 1V  Result Date: 08/04/2022 CLINICAL DATA:  [id number] SBO (small bowel obstruction) ([place]) [id number] EXAM: PORTABLE ABDOMEN - 1 VIEW COMPARISON:  X-ray abdomen 08/04/2022 FINDINGS: Partially visualized tube overlies left upper quadrant. Right upper quadrant surgical clips. PO contrast reaches the rectum. Persistent gaseous dilatation of several loops of small bowel. No radio-opaque calculi or other significant radiographic abnormality are seen. IMPRESSION: 1. PO contrast reaches the rectum. 2. Persistent gaseous dilatation of several loops of small bowel. Electronically Signed   By: Iven Finn M.D.   On: 08/04/2022 15:58   DG Abd Portable 1V-Small Bowel Obstruction Protocol-initial, 8 hr delay  Result Date: 08/04/2022 CLINICAL DATA:  8 hour small-bowel delayed film. EXAM: PORTABLE ABDOMEN - 1 VIEW COMPARISON:  Abdominal x-ray 08/03/2022. CT abdomen and pelvis 08/02/2022. FINDINGS: Oral contrast is seen throughout dilated small bowel loops measuring up to 5 cm. There is a small amount of contrast within the transverse colon. Colon is nondilated. No suspicious calcifications. IMPRESSION: 1. Findings  compatible with partial small bowel obstruction. Small bowel loops remain dilated, but contrast is seen to the level of the transverse colon. Electronically Signed   By: Ronney Asters M.D.   On: 08/04/2022 01:24    Anti-infectives: Anti-infectives (From admission, onward)    None        Assessment/Plan Nausea, vomiting, enteritis vs psbo on top of chronic dysmotility issues   - SBO protocol repeated 11/20 - film this AM with passage of contrast and no bowel  dilation - large BM yesterday - keep K >4.0 and Mg >2.0 to optimize bowel function  - clamp NGT and allow CLD, if tolerating for 6 hrs then ok to remove NGT later today  - no indication for emergent surgical intervention   FEN - clamping trial and CLD, IVFs VTE - heparin gtt for demand ischemia and NSTEMI, plavix/eliquis on hold ID - none currently needed   CAD NSTEMI - heparin x 48 hours CHF HTN HLD DM Bedbound Morbid obesity Chronic bowel dysmotility Peripheral neuropathy OSA   I reviewed Consultant cardiology notes, hospitalist notes, last 24 h vitals and pain scores, last 48 h intake and output, last 24 h labs and trends, and last 24 h imaging results.    LOS: 3 days    Norm Parcel, [facility] 08/05/2022, 12:22 PM Please see Amion for pager number during day hours 7:00am-4:30pm

## 2022-08-05 NOTE — Plan of Care (Signed)
  Problem: Education: Goal: Understanding of cardiac disease, CV risk reduction, and recovery process will improve Outcome: Progressing   Problem: Education: Goal: Individualized Educational Video(s) Outcome: Progressing

## 2022-08-05 NOTE — Care Management Important Message (Signed)
Important Message  Patient Details  Name: Randy Liu MRN: [id number] Date of Birth: [birth year]   Medicare Important Message Given:  Yes     [name] 08/05/2022, 1:59 PM

## 2022-08-05 NOTE — Progress Notes (Signed)
PROGRESS NOTE    Randy Liu  YSA:[id number] DOB: [birth year] DOA: 07/31/2022 PCP: [name], MD   Brief Narrative:  HPI: This is a 71 year old male with past medical history of DM2, DVT/PE on eliquis, HTN, HFrEF, ICM, multiple NSTEMIs over past several months.  11/17 he developed had an episode of nausea and vomiting.  Around 6:30 PM 11/18 he again started throwing up.  He took his Zofran, meclizine and his nighttime meds and everything came back up.  He had emesis all night.  He went to the pain between his shoulder blade, neck and shoulders.  He became diaphoretic.  911 was called.  He never had chest pains.   In the ER CT abdomen pelvis revealed small bowel obstruction.  Lab work revealed an initial troponin of 30, second troponin of 300.  EKG without acute ischemic changes.  Patient remains chest pain-free.   Cardiology contacted by ER physician via phone recommend ACS protocol.  They will see him in the a.m.   History provided by patient and wife who is present bedside.  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  He tells me he had a large bowel movement yesterday.  He still has NG tube.  General surgery managing.  CAD with previous history of PCI/recovered ischemic cardiomyopathy/chronic heart failure with  preserved ejection fraction /NSTEMI vs [place] ischemia: No chest pain or shortness of breath.  Cardiology consulted, patient is asymptomatic and is on heparin which I will discontinue today since it has been 48 hours, as cardiology recommended.  Cardiac enzymes however went up to 700 yesterday.  Cardiology had reviewed him and basically mentioned that patient is not candidate for any intervention and signed off.  Echo ruled out wall motion abnormality.  History of GI ulcers s/p duodenal polyp clipping: Continue IV Protonix.  Hemoglobin stable.  Essential hypertension: Controlled.  Continue current medications.  Type 2 diabetes mellitus: Blood sugar controlled.  Continue Semglee 25 units.  Add SSI.  Dyslipidemia/statin intolerance: Fenofibrate on hold.  Stage IIIa CKD: Stable at baseline.  Sleep apnea on CPAP at night at home.  DVT prophylaxis: SCDs Start: 08/02/22 0454   Code Status: Full Code  Family Communication: Wife present at bedside.  Plan of care discussed with patient in length and he/she verbalized understanding and agreed with it.  Status is: Inpatient Remains inpatient appropriate because: Still with SBO.   Estimated body mass index is 37.58 kg/m as calculated from the following:   Height as of this encounter: 6' 1"  (1.854 m).   Weight as of this encounter: 129.2 kg.    Nutritional Assessment: Body mass index is 37.58 kg/m.Marland Kitchen Seen by dietician.  I agree with the assessment and plan as outlined below: Nutrition Status:        . Skin Assessment: I have examined the patient's skin  and I agree with the wound assessment as performed by the wound care RN as outlined below:    Consultants:  Cardiology General surgery  Procedures:  As above  Antimicrobials:  Anti-infectives (From admission, onward)    None         Subjective:  Patient seen and examined.  Wife at the bedside.  Patient has no complaints.  He had large-volume wound  yesterday.  Objective: Vitals:   08/05/22 0000 08/05/22 0407 08/05/22 0758 08/05/22 0839  BP: (!) 147/68 (!) 151/68 138/66 (!) 116/55  Pulse: 81 73 60 69  Resp: 13 19 13 15   Temp:  97.6 F (36.4 C) 97.9 F (36.6 C)   TempSrc:  Oral Oral   SpO2: 93% 96% 94% 95%  Weight:  129.2 kg    Height:        Intake/Output Summary (Last 24 hours) at 08/05/2022 1051 Last data filed at 08/05/2022 0848 Gross per 24 hour  Intake 140 ml  Output 2050 ml  Net -1910 ml    Filed Weights   08/03/22 0015 08/04/22 0358 08/05/22 0407  Weight: 126.8 kg 125.8 kg 129.2 kg    Examination:  General exam: Appears calm and comfortable, obese Respiratory system: Clear to auscultation. Respiratory effort normal. Cardiovascular system: S1 & S2 heard, RRR. No JVD, murmurs, rubs, gallops or clicks.  +2 pitting edema bilateral lower extremity, chronic. Gastrointestinal system: Abdomen is nondistended, soft and nontender. No organomegaly or masses felt.  Diminished bowel sounds. Central nervous system: Alert and oriented. No focal neurological deficits. Extremities: Symmetric 5 x 5 power. Skin: No rashes, lesions or ulcers.  Psychiatry: Judgement and insight appear normal. Mood & affect appropriate.    Data Reviewed: I have personally reviewed following labs and imaging studies  CBC: Recent Labs  Lab 08/02/22 0004 08/02/22 0630 08/03/22 0046  WBC 11.2* 5.4 7.1  NEUTROABS 9.4* 3.3 3.7  HGB 14.5 14.0 13.5  HCT 45.8 44.6 41.5  MCV 94.2 94.7 95.2  PLT 270 261 756    Basic Metabolic Panel: Recent Labs  Lab 08/02/22 0004 08/02/22 0630 08/03/22 0046 08/04/22 0039 08/05/22 0111  NA 139 138 140 143 145  K 4.5 4.7 4.6 4.2 4.0  CL 101 97* 101 103 105  CO2 21* 25 24 29 23   GLUCOSE 265* 255* 202* 133* 129*  BUN 33* 34* 38* 35* 28*  CREATININE 2.08* 2.09* 1.99* 1.87* 1.48*  CALCIUM 10.0 9.9 9.4 9.5 9.3  MG  --  2.4 2.4  --   --     GFR: Estimated Creatinine Clearance: 62.6 mL/min (A) (by C-G  formula based on SCr of 1.48 mg/dL (H)). Liver Function Tests: Recent Labs  Lab 08/02/22 0004 08/02/22 0630  AST 23 24  ALT 23 23  ALKPHOS 39 40  BILITOT 0.7 0.7  PROT 7.1 6.8  ALBUMIN 3.5 3.3*    Recent Labs  Lab 08/02/22 0004  LIPASE 24    No results for input(s): "AMMONIA" in the last 168 hours. Coagulation Profile: No results for input(s): "INR", "PROTIME" in the last 168 hours. Cardiac Enzymes: No results for input(s): "CKTOTAL", "CKMB", "CKMBINDEX", "TROPONINI" in the last 168 hours. BNP (last 3 results) No results for input(s): "PROBNP" in the last 8760 hours. HbA1C: No results for input(s): "HGBA1C" in the last 72 hours. CBG: Recent Labs  Lab 08/04/22 1615 08/04/22 2001 08/04/22 2355 08/05/22 0405 08/05/22 0812  GLUCAP 128* 147* 120* 114* 133*    Lipid Profile: No results for input(s): "CHOL", "  HDL", "LDLCALC", "TRIG", "CHOLHDL", "LDLDIRECT" in the last 72 hours.  Thyroid Function Tests: No results for input(s): "TSH", "T4TOTAL", "FREET4", "T3FREE", "THYROIDAB" in the last 72 hours. Anemia Panel: No results for input(s): "VITAMINB12", "FOLATE", "FERRITIN", "TIBC", "IRON", "RETICCTPCT" in the last 72 hours. Sepsis Labs: Recent Labs  Lab 08/02/22 0004  LATICACIDVEN 1.6     No results found for this or any previous visit (from the past 240 hour(s)).   Radiology Studies: DG Abd Portable 1V  Result Date: 08/05/2022 CLINICAL DATA:  NG tube EXAM: PORTABLE ABDOMEN - 1 VIEW COMPARISON:  Abdominal x-ray 08/04/2022 FINDINGS: Nasogastric tube tip is in the mid/proximal stomach, unchanged from prior. No dilated bowel loops. There surgical clips in the right abdomen. IMPRESSION: Nasogastric tube tip is in the mid/proximal stomach. Electronically Signed   By: Ronney Asters M.D.   On: 08/05/2022 00:44   DG Abd Portable 1V  Result Date: 08/04/2022 CLINICAL DATA:  [id number] SBO (small bowel obstruction) ([place]) [id number] EXAM: PORTABLE ABDOMEN - 1 VIEW COMPARISON:   X-ray abdomen 08/04/2022 FINDINGS: Partially visualized tube overlies left upper quadrant. Right upper quadrant surgical clips. PO contrast reaches the rectum. Persistent gaseous dilatation of several loops of small bowel. No radio-opaque calculi or other significant radiographic abnormality are seen. IMPRESSION: 1. PO contrast reaches the rectum. 2. Persistent gaseous dilatation of several loops of small bowel. Electronically Signed   By: Iven Finn M.D.   On: 08/04/2022 15:58   DG Abd Portable 1V-Small Bowel Obstruction Protocol-initial, 8 hr delay  Result Date: 08/04/2022 CLINICAL DATA:  8 hour small-bowel delayed film. EXAM: PORTABLE ABDOMEN - 1 VIEW COMPARISON:  Abdominal x-ray 08/03/2022. CT abdomen and pelvis 08/02/2022. FINDINGS: Oral contrast is seen throughout dilated small bowel loops measuring up to 5 cm. There is a small amount of contrast within the transverse colon. Colon is nondilated. No suspicious calcifications. IMPRESSION: 1. Findings compatible with partial small bowel obstruction. Small bowel loops remain dilated, but contrast is seen to the level of the transverse colon. Electronically Signed   By: Ronney Asters M.D.   On: 08/04/2022 01:24   DG Abd Portable 1V  Result Date: 08/03/2022 CLINICAL DATA:  Small bowel obstruction. EXAM: PORTABLE ABDOMEN - 1 VIEW COMPARISON:  August 02, 2022. FINDINGS: Mildly dilated small bowel loops are noted concerning for ileus or distal small bowel obstruction. No colonic dilatation is noted. IMPRESSION: Mildly dilated small bowel loops are noted concerning for ileus or distal small bowel obstruction. Electronically Signed   By: Marijo Conception M.D.   On: 08/03/2022 12:23    Scheduled Meds:  insulin aspart  0-9 Units Subcutaneous Q4H   insulin glargine-yfgn  25 Units Subcutaneous Daily   latanoprost  1 drop Left Eye QHS   metoprolol tartrate  5 mg Intravenous Q8H   pantoprazole (PROTONIX) IV  40 mg Intravenous Q12H   Continuous  Infusions:  sodium chloride 75 mL/hr at 08/04/22 1410     LOS: 3 days   Darliss Cheney, MD [facility]  08/05/2022, 10:51 AM   *Please note that this is a verbal dictation therefore any spelling or grammatical errors are due to the "[place] [employer]" system interpretation.  Please page via [place] and do not message via secure chat for urgent patient care matters. Secure chat can be used for non urgent patient care matters.  How to contact the [facility] Attending or Consulting provider [place] or covering provider during after hours [place], for this patient?  Check the care  team in [facility] and look for a) attending/consulting [place] provider listed and b) the [facility] team listed. Page or secure chat [phone]. Log into [email or web address] and use [place]'s universal password to access. If you do not have the password, please contact the hospital operator. Locate the [facility] provider you are looking for under [facility] and page to a number that you can be directly reached. If you still have difficulty reaching the provider, please page the [facility] (Director on Call) for the Hospitalists listed on amion for assistance.

## 2022-08-06 ENCOUNTER — Inpatient Hospital Stay (HOSPITAL_COMMUNITY): Payer: Medicare Other

## 2022-08-06 DIAGNOSIS — I214 Non-ST elevation (NSTEMI) myocardial infarction: Secondary | ICD-10-CM | POA: Diagnosis not present

## 2022-08-06 LAB — GLUCOSE, CAPILLARY
Glucose-Capillary: 104 mg/dL — ABNORMAL HIGH (ref 70–99)
Glucose-Capillary: 107 mg/dL — ABNORMAL HIGH (ref 70–99)
Glucose-Capillary: 110 mg/dL — ABNORMAL HIGH (ref 70–99)
Glucose-Capillary: 115 mg/dL — ABNORMAL HIGH (ref 70–99)
Glucose-Capillary: 124 mg/dL — ABNORMAL HIGH (ref 70–99)
Glucose-Capillary: 167 mg/dL — ABNORMAL HIGH (ref 70–99)

## 2022-08-06 MED ORDER — BISACODYL 10 MG RE SUPP
10.0000 mg | Freq: Once | RECTAL | Status: AC
  Administered 2022-08-06: 10 mg via RECTAL
  Filled 2022-08-06: qty 1

## 2022-08-06 MED ORDER — METOCLOPRAMIDE HCL 5 MG PO TABS: 5.0000 mg | ORAL_TABLET | Freq: Every day | ORAL | Status: DC

## 2022-08-06 MED ORDER — METOCLOPRAMIDE HCL 5 MG PO TABS: 5.0000 mg | ORAL_TABLET | Freq: Three times a day (TID) | ORAL | Status: DC

## 2022-08-06 MED ORDER — METOCLOPRAMIDE HCL 5 MG/ML IJ SOLN
5.0000 mg | Freq: Three times a day (TID) | INTRAMUSCULAR | Status: DC
  Administered 2022-08-06 – 2022-08-10 (×13): 5 mg via INTRAVENOUS
  Filled 2022-08-06 (×14): qty 2

## 2022-08-06 NOTE — Progress Notes (Signed)
Progress Note     Subjective: Pt has only taken in ~40 oz clears and reports some nausea this AM. No emesis. No worsened abdominal pain.  Addendum: at 30 notified by [facility] and RN that patient vomited green bilious emesis    Objective: Vital signs in last 24 hours: Temp:  [97.4 F (36.3 C)-98 F (36.7 C)] 97.9 F (36.6 C) (11/23 0409) Pulse Rate:  [70-90] 70 (11/23 0409) Resp:  [17-20] 17 (11/23 0409) BP: (134-155)/(58-83) 137/60 (11/23 0409) SpO2:  [91 %-100 %] 100 % (11/23 0409) Weight:  [132 kg] 132 kg (11/23 0409) Last BM Date : 08/04/22  Intake/Output from previous day: 11/22 0701 - 11/23 0700 In: 1700 [P.O.:740; I.V.:900; NG/GT:60] Out: 900 [Urine:500; Emesis/NG output:400] Intake/Output this shift: No intake/output data recorded.  PE: Gen:  Alert, NAD, pleasant Abd: obese, soft, minimal central abdominal TTP without rebound or guarding   Lab Results:  No results for input(s): "WBC", "HGB", "HCT", "PLT" in the last 72 hours. BMET Recent Labs    08/04/22 0039 08/05/22 0111  NA 143 145  K 4.2 4.0  CL 103 105  CO2 29 23  GLUCOSE 133* 129*  BUN 35* 28*  CREATININE 1.87* 1.48*  CALCIUM 9.5 9.3   PT/INR No results for input(s): "LABPROT", "INR" in the last 72 hours. CMP     Component Value Date/Time   NA 145 08/05/2022 0111   K 4.0 08/05/2022 0111   CL 105 08/05/2022 0111   CO2 23 08/05/2022 0111   GLUCOSE 129 (H) 08/05/2022 0111   BUN 28 (H) 08/05/2022 0111   CREATININE 1.48 (H) 08/05/2022 0111   CALCIUM 9.3 08/05/2022 0111   PROT 6.8 08/02/2022 0630   ALBUMIN 3.3 (L) 08/02/2022 0630   AST 24 08/02/2022 0630   ALT 23 08/02/2022 0630   ALKPHOS 40 08/02/2022 0630   BILITOT 0.7 08/02/2022 0630   GFRNONAA 50 (L) 08/05/2022 0111   Lipase     Component Value Date/Time   LIPASE 24 08/02/2022 0004       Studies/Results: DG Abd Portable 1V  Result Date: 08/05/2022 CLINICAL DATA:  NG tube EXAM: PORTABLE ABDOMEN - 1 VIEW COMPARISON:   Abdominal x-ray 08/04/2022 FINDINGS: Nasogastric tube tip is in the mid/proximal stomach, unchanged from prior. No dilated bowel loops. There surgical clips in the right abdomen. IMPRESSION: Nasogastric tube tip is in the mid/proximal stomach. Electronically Signed   By: Ronney Asters M.D.   On: 08/05/2022 00:44   DG Abd Portable 1V  Result Date: 08/04/2022 CLINICAL DATA:  [id number] SBO (small bowel obstruction) ([place]) [id number] EXAM: PORTABLE ABDOMEN - 1 VIEW COMPARISON:  X-ray abdomen 08/04/2022 FINDINGS: Partially visualized tube overlies left upper quadrant. Right upper quadrant surgical clips. PO contrast reaches the rectum. Persistent gaseous dilatation of several loops of small bowel. No radio-opaque calculi or other significant radiographic abnormality are seen. IMPRESSION: 1. PO contrast reaches the rectum. 2. Persistent gaseous dilatation of several loops of small bowel. Electronically Signed   By: Iven Finn M.D.   On: 08/04/2022 15:58    Anti-infectives: Anti-infectives (From admission, onward)    None        Assessment/Plan  Nausea, vomiting, enteritis vs psbo on top of chronic dysmotility issues   - SBO protocol repeated 11/20 - vomited CLD this AM - repeat KUB and go back to NPO - keep K >4.0 and Mg >2.0 to optimize bowel function  - pt on reglan at home for gastroparesis - start IV here and  increase to TID - no indication for emergent surgical intervention    FEN - NPO, IVFs VTE - heparin gtt for demand ischemia and NSTEMI, plavix/eliquis on hold ID - none currently needed   CAD NSTEMI - heparin x 48 hours CHF HTN HLD DM Bedbound Morbid obesity Chronic bowel dysmotility Peripheral neuropathy OSA   LOS: 4 days     Norm Parcel, [facility] Surgery 08/06/2022, 9:36 AM Please see Amion for pager number during day hours 7:00am-4:30pm

## 2022-08-06 NOTE — Progress Notes (Signed)
PROGRESS NOTE    Randy Liu  POE:[id number] DOB: 09-30-1950 DOA: 07/29/2022 PCP: [name], MD   Brief Narrative:  HPI: This is a 71 year old male with past medical history of DM2, DVT/PE on eliquis, HTN, HFrEF, ICM, multiple NSTEMIs over past several months.  11/17 he developed had an episode of nausea and vomiting.  Around 6:30 PM 11/18 he again started throwing up.  He took his Zofran, meclizine and his nighttime meds and everything came back up.  He had emesis all night.  He went to the pain between his shoulder blade, neck and shoulders.  He became diaphoretic.  911 was called.  He never had chest pains.   In the ER CT abdomen pelvis revealed small bowel obstruction.  Lab work revealed an initial troponin of 30, second troponin of 300.  EKG without acute ischemic changes.  Patient remains chest pain-free.   Cardiology contacted by ER physician via phone recommend ACS protocol.  They will see him in the a.m.   History provided by patient and wife who is present bedside.  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  NGT was clamped and eventually removed on 08/05/2022 and he was started on clear liquid diet, he was doing fine until this morning when he had large amount of bilious vomiting, I notified general surgery, he  is n.p.o. again, abdominal x-ray is ordered.  CAD with previous history of PCI/recovered ischemic cardiomyopathy/chronic heart failure with preserved ejection fraction /NSTEMI vs Demand ischemia: No chest pain or shortness of breath.  Cardiology consulted, patient is asymptomatic and is on heparin which I will discontinue today since it has been 48 hours, as cardiology recommended.  Cardiac enzymes however went up to 700 yesterday.  Cardiology had reviewed him and basically mentioned that patient is not candidate for any intervention and signed off.  Echo ruled out wall motion abnormality.  History of GI ulcers s/p duodenal polyp clipping: Continue IV Protonix.  Hemoglobin stable.  Essential hypertension: Controlled.  Continue current medications.  Type 2 diabetes mellitus: Blood sugar controlled.  Continue Semglee 25 units.  Add SSI.  Dyslipidemia/statin intolerance: Fenofibrate on hold.  Stage IIIa CKD: Stable at baseline.  Sleep apnea on CPAP at night at home.  DVT prophylaxis: SCDs Start: 08/02/22 0454   Code Status: Full Code  Family Communication: Wife present at bedside.  Plan of care discussed with patient in length and he/she verbalized understanding and agreed with it.  Status is: Inpatient Remains inpatient appropriate because: Still with SBO.   Estimated body mass index is 38.39 kg/m as calculated from the following:   Height as of this encounter: 6' 1"  (1.854 m).   Weight as of this encounter: 132 kg.    Nutritional Assessment: Body mass index is 38.39 kg/m.Marland Kitchen Seen by dietician.  I agree with the assessment and  plan as outlined below: Nutrition Status:        . Skin Assessment: I have examined the patient's skin and I agree with the wound assessment as performed by the wound care RN as outlined below:    Consultants:  Cardiology General surgery  Procedures:  As above  Antimicrobials:  Anti-infectives (From admission, onward)    None          Subjective:  Patient seen and examined.  He was vomiting when I entered the room.  He had also mentioned that he is not passing flatus since yesterday but did not have any increased abdominal pain.  Objective: Vitals:   08/05/22 1856 08/05/22 1942 08/06/22 0409 08/06/22 1053  BP: (!) 155/58 134/83 137/60 (!) 148/68  Pulse: 76 90 70 75  Resp: 17 18 17 18   Temp: 98 F (36.7 C) (!) 97.4 F (36.3 C) 97.9 F (36.6 C) 98 F (36.7 C)  TempSrc: Oral Oral Oral Oral  SpO2: 96% 91% 100% 93%  Weight:   132 kg   Height:        Intake/Output Summary (Last 24 hours) at 08/06/2022 1057 Last data filed at 08/06/2022 9678 Gross per 24 hour  Intake 1650 ml  Output 750 ml  Net 900 ml    Filed Weights   08/04/22 0358 08/05/22 0407 08/06/22 0409  Weight: 125.8 kg 129.2 kg 132 kg    Examination:  General exam: Actively vomiting, obese. Respiratory system: Clear to auscultation. Respiratory effort normal. Cardiovascular system: S1 & S2 heard, RRR. No JVD, murmurs, rubs, gallops or clicks. No pedal edema. Gastrointestinal system: Abdomen is nondistended, soft and nontender. No organomegaly or masses felt. Normal bowel sounds heard. Central nervous system: Alert and oriented. No focal neurological deficits. Extremities: Symmetric 5 x 5 power. Skin: No rashes, lesions or ulcers.   Data Reviewed: I have personally reviewed following labs and imaging studies  CBC: Recent Labs  Lab 08/02/22 0004 08/02/22 0630 08/03/22 0046  WBC 11.2* 5.4 7.1  NEUTROABS 9.4* 3.3 3.7  HGB 14.5 14.0 13.5  HCT 45.8 44.6 41.5  MCV 94.2 94.7 95.2  PLT 270 261 938    Basic Metabolic Panel: Recent Labs  Lab 08/02/22 0004 08/02/22 0630 08/03/22 0046 08/04/22 0039 08/05/22 0111  NA 139 138 140 143 145  K 4.5 4.7 4.6 4.2 4.0  CL 101 97* 101 103 105  CO2 21* 25 24 29 23   GLUCOSE 265* 255* 202* 133* 129*  BUN 33* 34* 38* 35* 28*  CREATININE 2.08* 2.09* 1.99* 1.87* 1.48*  CALCIUM 10.0 9.9 9.4 9.5  9.3  MG  --  2.4 2.4  --   --     GFR: Estimated Creatinine Clearance: 63.3 mL/min (A) (by C-G formula based on SCr of 1.48 mg/dL (H)). Liver Function Tests: Recent Labs  Lab 08/02/22 0004 08/02/22 0630  AST 23 24  ALT 23 23  ALKPHOS 39 40  BILITOT 0.7 0.7  PROT 7.1 6.8  ALBUMIN 3.5 3.3*    Recent Labs  Lab 08/02/22 0004  LIPASE 24    No results for input(s): "AMMONIA" in the last 168 hours. Coagulation Profile: No results for input(s): "INR", "PROTIME" in the last 168 hours. Cardiac Enzymes: No results for input(s): "CKTOTAL", "CKMB", "CKMBINDEX", "TROPONINI" in the last 168 hours. BNP (last 3 results) No results for input(s): "PROBNP" in the last 8760 hours. HbA1C: No results for input(s): "HGBA1C" in the last 72 hours. CBG: Recent Labs  Lab 08/05/22 1952 08/05/22 2322  08/06/22 0411 08/06/22 0730 08/06/22 1052  GLUCAP 147* 132* 104* 115* 167*    Lipid Profile: No results for input(s): "CHOL", "HDL", "LDLCALC", "TRIG", "CHOLHDL", "LDLDIRECT" in the last 72 hours.  Thyroid Function Tests: No results for input(s): "TSH", "T4TOTAL", "FREET4", "T3FREE", "THYROIDAB" in the last 72 hours. Anemia Panel: No results for input(s): "VITAMINB12", "FOLATE", "FERRITIN", "TIBC", "IRON", "RETICCTPCT" in the last 72 hours. Sepsis Labs: Recent Labs  Lab 08/02/22 0004  LATICACIDVEN 1.6     No results found for this or any previous visit (from the past 240 hour(s)).   Radiology Studies: DG Abd Portable 1V  Result Date: 08/05/2022 CLINICAL DATA:  NG tube EXAM: PORTABLE ABDOMEN - 1 VIEW COMPARISON:  Abdominal x-ray 08/04/2022 FINDINGS: Nasogastric tube tip is in the mid/proximal stomach, unchanged from prior. No dilated bowel loops. There surgical clips in the right abdomen. IMPRESSION: Nasogastric tube tip is in the mid/proximal stomach. Electronically Signed   By: Ronney Asters M.D.   On: 08/05/2022 00:44   DG Abd Portable 1V  Result Date: 08/04/2022 CLINICAL DATA:   [id number] SBO (small bowel obstruction) ([place]) [id number] EXAM: PORTABLE ABDOMEN - 1 VIEW COMPARISON:  X-ray abdomen 08/04/2022 FINDINGS: Partially visualized tube overlies left upper quadrant. Right upper quadrant surgical clips. PO contrast reaches the rectum. Persistent gaseous dilatation of several loops of small bowel. No radio-opaque calculi or other significant radiographic abnormality are seen. IMPRESSION: 1. PO contrast reaches the rectum. 2. Persistent gaseous dilatation of several loops of small bowel. Electronically Signed   By: Iven Finn M.D.   On: 08/04/2022 15:58    Scheduled Meds:  bisacodyl  10 mg Rectal Once   insulin aspart  0-9 Units Subcutaneous Q4H   insulin glargine-yfgn  25 Units Subcutaneous Daily   latanoprost  1 drop Left Eye QHS   metoCLOPramide (REGLAN) injection  5 mg Intravenous Q8H   metoprolol tartrate  5 mg Intravenous Q8H   pantoprazole (PROTONIX) IV  40 mg Intravenous Q12H   Continuous Infusions:  sodium chloride 75 mL/hr at 08/06/22 0100     LOS: 4 days   Darliss Cheney, MD [facility]  08/06/2022, 10:57 AM   *Please note that this is a verbal dictation therefore any spelling or grammatical errors are due to the "[employer]" system interpretation.  Please page via [place] and do not message via secure chat for urgent patient care matters. Secure chat can be used for non urgent patient care matters.  How to contact the  [facility] Attending or Consulting provider [place] or covering provider during after hours [place], for this patient?  Check the care team in [facility] and look for a) attending/consulting [facility] provider listed and b) the [facility] team listed. Page or secure chat [phone]. Log into [email or web address] and use [place]'s universal password to access. If you do not have the password, please contact the hospital operator. Locate the [facility] provider you are looking for under [facility] and page to a number that you can be directly reached. If you  still have difficulty reaching the provider, please page the [facility] (Director on Call) for the Hospitalists listed on amion for assistance.

## 2022-08-06 NOTE — Progress Notes (Signed)
Refused cpap.

## 2022-08-07 DIAGNOSIS — I214 Non-ST elevation (NSTEMI) myocardial infarction: Secondary | ICD-10-CM | POA: Diagnosis not present

## 2022-08-07 DIAGNOSIS — E876 Hypokalemia: Secondary | ICD-10-CM | POA: Diagnosis present

## 2022-08-07 LAB — GLUCOSE, CAPILLARY
Glucose-Capillary: 115 mg/dL — ABNORMAL HIGH (ref 70–99)
Glucose-Capillary: 116 mg/dL — ABNORMAL HIGH (ref 70–99)
Glucose-Capillary: 121 mg/dL — ABNORMAL HIGH (ref 70–99)
Glucose-Capillary: 151 mg/dL — ABNORMAL HIGH (ref 70–99)
Glucose-Capillary: 142 mg/dL — ABNORMAL HIGH (ref 70–99)

## 2022-08-07 LAB — CBC WITH DIFFERENTIAL/PLATELET
Abs Immature Granulocytes: 0.05 10*3/uL (ref 0.00–0.07)
Basophils Absolute: 0 10*3/uL (ref 0.0–0.1)
Basophils Relative: 0 %
Eosinophils Absolute: 0.2 10*3/uL (ref 0.0–0.5)
Eosinophils Relative: 3 %
HCT: 36.6 % — ABNORMAL LOW (ref 39.0–52.0)
Hemoglobin: 11.3 g/dL — ABNORMAL LOW (ref 13.0–17.0)
Immature Granulocytes: 1 %
Lymphocytes Relative: 26 %
Lymphs Abs: 1.4 10*3/uL (ref 0.7–4.0)
MCH: 29.2 pg (ref 26.0–34.0)
MCHC: 30.9 g/dL (ref 30.0–36.0)
MCV: 94.6 fL (ref 80.0–100.0)
Monocytes Absolute: 0.5 10*3/uL (ref 0.1–1.0)
Monocytes Relative: 9 %
Neutro Abs: 3.4 10*3/uL (ref 1.7–7.7)
Neutrophils Relative %: 61 %
Platelets: 207 10*3/uL (ref 150–400)
RBC: 3.87 MIL/uL — ABNORMAL LOW (ref 4.22–5.81)
RDW: 15.4 % (ref 11.5–15.5)
WBC: 5.5 10*3/uL (ref 4.0–10.5)
nRBC: 0 % (ref 0.0–0.2)

## 2022-08-07 LAB — BASIC METABOLIC PANEL
Anion gap: 11 (ref 5–15)
BUN: 14 mg/dL (ref 8–23)
CO2: 22 mmol/L (ref 22–32)
Calcium: 8.8 mg/dL — ABNORMAL LOW (ref 8.9–10.3)
Chloride: 108 mmol/L (ref 98–111)
Creatinine, Ser: 1.19 mg/dL (ref 0.61–1.24)
GFR, Estimated: >60 mL/min (ref >60)
Glucose, Bld: 109 mg/dL — ABNORMAL HIGH (ref 70–99)
Potassium: 3.4 mmol/L — ABNORMAL LOW (ref 3.5–5.1)
Sodium: 141 mmol/L (ref 135–145)

## 2022-08-07 LAB — MAGNESIUM: Magnesium: 1.6 mg/dL — ABNORMAL LOW (ref 1.7–2.4)

## 2022-08-07 MED ORDER — POTASSIUM CHLORIDE 10 MEQ/100ML IV SOLN
10.0000 meq | INTRAVENOUS | Status: AC
  Administered 2022-08-07 (×4): 10 meq via INTRAVENOUS
  Filled 2022-08-07 (×4): qty 100

## 2022-08-07 MED ORDER — MAGNESIUM SULFATE 2 GM/50ML IV SOLN
2.0000 g | Freq: Once | INTRAVENOUS | Status: AC
  Administered 2022-08-07: 2 g via INTRAVENOUS
  Filled 2022-08-07: qty 50

## 2022-08-07 NOTE — Plan of Care (Signed)
Problem: Education: Goal: Understanding of cardiac disease, CV risk reduction, and recovery process will improve 08/07/2022 0044 by Driscilla Grammes, Ardeen Fillers, RN Outcome: Progressing 08/07/2022 0044 by Tillie Fantasia, RN Outcome: Progressing Goal: Individualized Educational Video(s) 08/07/2022 0044 by Tillie Fantasia, RN Outcome: Progressing 08/07/2022 0044 by Tillie Fantasia, RN Outcome: Progressing   Problem: Activity: Goal: Ability to tolerate increased activity will improve 08/07/2022 0044 by Driscilla Grammes, Ardeen Fillers, RN Outcome: Progressing 08/07/2022 0044 by Driscilla Grammes, Ardeen Fillers, RN Outcome: Progressing   Problem: Cardiac: Goal: Ability to achieve and maintain adequate cardiovascular perfusion will improve 08/07/2022 0044 by Tillie Fantasia, RN Outcome: Progressing 08/07/2022 0044 by Tillie Fantasia, RN Outcome: Progressing   Problem: Health Behavior/Discharge Planning: Goal: Ability to safely manage health-related needs after discharge will improve 08/07/2022 0044 by Tillie Fantasia, RN Outcome: Progressing 08/07/2022 0044 by Tillie Fantasia, RN Outcome: Progressing   Problem: Education: Goal: Knowledge of General Education information will improve Description: Including pain rating scale, medication(s)/side effects and non-pharmacologic comfort measures 08/07/2022 0044 by Tillie Fantasia, RN Outcome: Progressing 08/07/2022 0044 by Tillie Fantasia, RN Outcome: Progressing   Problem: Health Behavior/Discharge Planning: Goal: Ability to manage health-related needs will improve 08/07/2022 0044 by Tillie Fantasia, RN Outcome: Progressing 08/07/2022 0044 by Tillie Fantasia, RN Outcome: Progressing   Problem: Clinical Measurements: Goal: Ability to maintain clinical measurements within normal limits will improve 08/07/2022 0044 by  Tillie Fantasia, RN Outcome: Progressing 08/07/2022 0044 by Tillie Fantasia, RN Outcome: Progressing Goal: Will remain free from infection 08/07/2022 0044 by Tillie Fantasia, RN Outcome: Progressing 08/07/2022 0044 by Tillie Fantasia, RN Outcome: Progressing Goal: Diagnostic test results will improve 08/07/2022 0044 by Tillie Fantasia, RN Outcome: Progressing 08/07/2022 0044 by Tillie Fantasia, RN Outcome: Progressing Goal: Respiratory complications will improve 08/07/2022 0044 by Tillie Fantasia, RN Outcome: Progressing 08/07/2022 0044 by Tillie Fantasia, RN Outcome: Progressing Goal: Cardiovascular complication will be avoided 08/07/2022 0044 by Tillie Fantasia, RN Outcome: Progressing 08/07/2022 0044 by Tillie Fantasia, RN Outcome: Progressing   Problem: Activity: Goal: Risk for activity intolerance will decrease 08/07/2022 0044 by Driscilla Grammes, Ardeen Fillers, RN Outcome: Progressing 08/07/2022 0044 by Driscilla Grammes, Ardeen Fillers, RN Outcome: Progressing   Problem: Nutrition: Goal: Adequate nutrition will be maintained 08/07/2022 0044 by Tillie Fantasia, RN Outcome: Progressing 08/07/2022 0044 by Tillie Fantasia, RN Outcome: Progressing   Problem: Coping: Goal: Level of anxiety will decrease Outcome: Progressing   Problem: Elimination: Goal: Will not experience complications related to bowel motility Outcome: Progressing Goal: Will not experience complications related to urinary retention Outcome: Progressing   Problem: Pain Managment: Goal: General experience of comfort will improve Outcome: Progressing   Problem: Safety: Goal: Ability to remain free from injury will improve Outcome: Progressing   Problem: Skin Integrity: Goal: Risk for impaired skin integrity will decrease Outcome: Progressing   Problem: Education: Goal:  Ability to describe self-care measures that may prevent or decrease complications (Diabetes Survival Skills Education) will improve Outcome: Progressing Goal: Individualized Educational Video(s) Outcome: Progressing   Problem: Coping: Goal: Ability to adjust to condition or change in health will improve Outcome: Progressing   Problem: Fluid Volume: Goal: Ability to maintain a balanced intake and output will improve Outcome: Progressing   Problem: Health Behavior/Discharge Planning: Goal: Ability to identify  and utilize available resources and services will improve Outcome: Progressing Goal: Ability to manage health-related needs will improve Outcome: Progressing   Problem: Metabolic: Goal: Ability to maintain appropriate glucose levels will improve Outcome: Progressing   Problem: Nutritional: Goal: Maintenance of adequate nutrition will improve Outcome: Progressing Goal: Progress toward achieving an optimal weight will improve Outcome: Progressing   Problem: Skin Integrity: Goal: Risk for impaired skin integrity will decrease Outcome: Progressing   Problem: Tissue Perfusion: Goal: Adequacy of tissue perfusion will improve Outcome: Progressing

## 2022-08-07 NOTE — Progress Notes (Signed)
Refused cpap.

## 2022-08-07 NOTE — Progress Notes (Signed)
Progress Note     Subjective: Pt vomited yesterday but no further emesis. Denies nausea this AM. Reports flatus and another BM overnight. Wife at bedside.   Objective: Vital signs in last 24 hours: Temp:  [97.9 F (36.6 C)-98 F (36.7 C)] 97.9 F (36.6 C) (11/24 0902) Pulse Rate:  [63-75] 72 (11/24 0902) Resp:  [15-20] 16 (11/24 0902) BP: (121-148)/(52-70) 141/70 (11/24 0902) SpO2:  [93 %-95 %] 95 % (11/24 0902) Weight:  [134.4 kg] 134.4 kg (11/24 0408) Last BM Date : 08/04/22  Intake/Output from previous day: 11/23 0701 - 11/24 0700 In: 300 [P.O.:300] Out: -  Intake/Output this shift: No intake/output data recorded.  PE: Gen:  Alert, NAD, pleasant Abd: obese, soft, minimal central abdominal TTP without rebound or guarding   Lab Results:  Recent Labs    08/07/22 0040  WBC 5.5  HGB 11.3*  HCT 36.6*  PLT 207   BMET Recent Labs    08/05/22 0111 08/07/22 0040  NA 145 141  K 4.0 3.4*  CL 105 108  CO2 23 22  GLUCOSE 129* 109*  BUN 28* 14  CREATININE 1.48* 1.19  CALCIUM 9.3 8.8*    PT/INR No results for input(s): "LABPROT", "INR" in the last 72 hours. CMP     Component Value Date/Time   NA 141 08/07/2022 0040   K 3.4 (L) 08/07/2022 0040   CL 108 08/07/2022 0040   CO2 22 08/07/2022 0040   GLUCOSE 109 (H) 08/07/2022 0040   BUN 14 08/07/2022 0040   CREATININE 1.19 08/07/2022 0040   CALCIUM 8.8 (L) 08/07/2022 0040   PROT 6.8 08/02/2022 0630   ALBUMIN 3.3 (L) 08/02/2022 0630   AST 24 08/02/2022 0630   ALT 23 08/02/2022 0630   ALKPHOS 40 08/02/2022 0630   BILITOT 0.7 08/02/2022 0630   GFRNONAA >60 08/07/2022 0040   Lipase     Component Value Date/Time   LIPASE 24 08/02/2022 0004       Studies/Results: DG Abd Portable 1V  Result Date: 08/06/2022 CLINICAL DATA:  Abdominal pain and vomiting. EXAM: PORTABLE ABDOMEN - 1 VIEW COMPARISON:  08/05/2022 FINDINGS: Enteric tube has been removed from the stomach. A few mildly distended gas-filled  loops of small bowel are again noted. Oral contrast within the colon again identified. IMPRESSION: Enteric tube removed from the stomach. Otherwise unchanged appearance of the abdomen with a few mildly dilated gas-filled loops of small bowel again noted. Electronically Signed   By: Margarette Canada M.D.   On: 08/06/2022 11:22    Anti-infectives: Anti-infectives (From admission, onward)    None        Assessment/Plan  Nausea, vomiting, enteritis vs psbo on top of chronic dysmotility issues   - SBO protocol repeated 11/20 - KUB yesterday with few mildly dilated gas-filled loops of small bowel again noted. - clinically having more bowel function - ok to try some clears again today on increased reglan. If n/v then needs NGT replaced and consider repeat CT  - keep K >4.0 and Mg >2.0 to optimize bowel function  - pt on reglan at home for gastroparesis - increased to TID yesterday  - no indication for emergent surgical intervention    FEN - CLD, IVFs VTE - heparin gtt for demand ischemia and NSTEMI, plavix/eliquis on hold ID - none currently needed   CAD NSTEMI - heparin x 48 hours CHF HTN HLD DM Bedbound Morbid obesity Chronic bowel dysmotility Peripheral neuropathy OSA   LOS: 5 days  Norm Parcel, [facility] 08/07/2022, 10:38 AM Please see Amion for pager number during day hours 7:00am-4:30pm

## 2022-08-07 NOTE — Progress Notes (Signed)
PROGRESS NOTE    Randy Liu  WPV:[id number] DOB: Aug 05, 1951 DOA: 07/28/2022 PCP: [name], MD   Brief Narrative:  HPI: This is a 71 year old male with past medical history of DM2, DVT/PE on eliquis, HTN, HFrEF, ICM, multiple NSTEMIs over past several months.  11/17 he developed had an episode of nausea and vomiting.  Around 6:30 PM 11/18 he again started throwing up.  He took his Zofran, meclizine and his nighttime meds and everything came back up.  He had emesis all night.  He went to the pain between his shoulder blade, neck and shoulders.  He became diaphoretic.  911 was called.  He never had chest pains.   In the ER CT abdomen pelvis revealed small bowel obstruction.  Lab work revealed an initial troponin of 30, second troponin of 300.  EKG without acute ischemic changes.  Patient remains chest pain-free.   Cardiology contacted by ER physician via phone recommend ACS protocol.  They will see him in the a.m.   History provided by patient and wife who is present bedside.  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  NGT was clamped and eventually removed on 08/05/2022 and he was started on clear liquid diet, he was doing fine until morning of 08/06/2022 when he had large amount of bilious vomiting and started complaining  of abdominal pain and not passing flatus, repeat abdominal x-ray was done which was unremarkable but he was backed up with n.p.o., he is feeling much better today, no nausea, passing flatus, he has been started on clears now, surgery following and managing.    CAD with previous history of PCI/recovered ischemic cardiomyopathy/chronic heart failure with preserved ejection fraction /NSTEMI vs Demand ischemia: No chest pain or shortness of breath.  Cardiology consulted, patient is asymptomatic and is on heparin which I will discontinue today since it has been 48 hours, as cardiology recommended.  Cardiac enzymes however went up to 700 yesterday.  Cardiology had reviewed him and basically mentioned that patient is not candidate for any intervention and signed off.  Echo ruled out wall motion abnormality.  History of GI ulcers s/p duodenal polyp clipping: Continue IV Protonix.  Hemoglobin stable.  Essential hypertension: Controlled.  Continue current medications.  Type 2 diabetes mellitus: Blood sugar controlled.  Continue Semglee 25 units.  Add SSI.  Dyslipidemia/statin intolerance: Fenofibrate on hold.  Stage IIIa CKD: Stable at baseline.  Sleep apnea on CPAP at night at home.  Hypokalemia/hypomagnesemia: Will replace.  DVT prophylaxis: SCDs Start: 08/02/22 0454   Code Status: Full Code  Family Communication: Wife present at bedside.  Plan of care discussed with patient in length and he/she verbalized understanding and agreed with it.  Status is: Inpatient Remains inpatient appropriate because: Still with SBO.   Estimated body mass index is 39.09 kg/m as calculated from the following:  Height as of this encounter: 6' 1"  (1.854 m).   Weight as of this encounter: 134.4 kg.    Nutritional Assessment: Body mass index is 39.09 kg/m.Marland Kitchen Seen by dietician.  I agree with the assessment and plan as outlined below: Nutrition Status:        . Skin Assessment: I have examined the patient's  skin and I agree with the wound assessment as performed by the wound care RN as outlined below:    Consultants:  Cardiology General surgery  Procedures:  As above  Antimicrobials:  Anti-infectives (From admission, onward)    None         Subjective:  Seen and examined.  Feels better today.  Passing flatus, no nausea or vomiting.  Wife at the bedside.  Objective: Vitals:   08/06/22 1940 08/06/22 2045 08/07/22 0408 08/07/22 0416  BP: (!) 140/52 (!) 136/55  (!) 128/54  Pulse: 71 72  67  Resp: 16 20  15   Temp: 98 F (36.7 C)     TempSrc: Oral     SpO2: 95% 94%  93%  Weight:   134.4 kg   Height:        Intake/Output Summary (Last 24 hours) at 08/07/2022 0815 Last data filed at 08/07/2022 0500 Gross per 24 hour  Intake 300 ml  Output --  Net 300 ml    Filed Weights   08/05/22 0407 08/06/22 0409 08/07/22 0408  Weight: 129.2 kg 132 kg 134.4 kg    Examination:  General exam: Appears calm and comfortable, obese Respiratory system: Clear to auscultation. Respiratory effort normal. Cardiovascular system: S1 & S2 heard, RRR. No JVD, murmurs, rubs, gallops or clicks. No pedal edema. Gastrointestinal system: Abdomen is nondistended, soft and nontender. No organomegaly or masses felt.  Diminished bowel sounds Central nervous system: Alert and oriented. No focal neurological deficits. Extremities: Symmetric 5 x 5 power. Skin: No rashes, lesions or ulcers.  Psychiatry: Judgement and insight appear normal. Mood & affect appropriate.   Data Reviewed: I have personally reviewed following labs and imaging studies  CBC: Recent Labs  Lab 08/02/22 0004 08/02/22 0630 08/03/22 0046 08/07/22 0040  WBC 11.2* 5.4 7.1 5.5  NEUTROABS 9.4* 3.3 3.7 3.4  HGB 14.5 14.0 13.5 11.3*  HCT 45.8 44.6 41.5 36.6*  MCV 94.2 94.7 95.2 94.6  PLT 270 261 245 433    Basic Metabolic Panel: Recent Labs  Lab 08/02/22 0630 08/03/22 0046 08/04/22 0039 08/05/22 0111 08/07/22 0040  NA  138 140 143 145 141  K 4.7 4.6 4.2 4.0 3.4*  CL 97* 101 103 105 108  CO2 25 24 29 23 22   GLUCOSE 255* 202* 133* 129* 109*  BUN 34* 38* 35* 28* 14  CREATININE 2.09* 1.99* 1.87* 1.48* 1.19  CALCIUM 9.9 9.4 9.5 9.3 8.8*  MG 2.4 2.4  --   --  1.6*    GFR: Estimated Creatinine Clearance: 79.5 mL/min (by C-G formula based on SCr of 1.19 mg/dL). Liver Function Tests: Recent Labs  Lab 08/02/22 0004 08/02/22 0630  AST 23 24  ALT 23 23  ALKPHOS 39 40  BILITOT 0.7 0.7  PROT 7.1 6.8  ALBUMIN 3.5 3.3*    Recent Labs  Lab 08/02/22 0004  LIPASE 24    No results for input(s): "AMMONIA" in the last 168 hours. Coagulation Profile: No results for input(s): "INR", "PROTIME" in the last 168 hours. Cardiac Enzymes: No results for input(s): "CKTOTAL", "CKMB", "CKMBINDEX", "TROPONINI" in the last 168 hours. BNP (last 3  results) No results for input(s): "PROBNP" in the last 8760 hours. HbA1C: No results for input(s): "HGBA1C" in the last 72 hours. CBG: Recent Labs  Lab 08/06/22 1052 08/06/22 1630 08/06/22 2012 08/06/22 2358 08/07/22 0416  GLUCAP 167* 124* 110* 107* 115*    Lipid Profile: No results for input(s): "CHOL", "HDL", "LDLCALC", "TRIG", "CHOLHDL", "LDLDIRECT" in the last 72 hours.  Thyroid Function Tests: No results for input(s): "TSH", "T4TOTAL", "FREET4", "T3FREE", "THYROIDAB" in the last 72 hours. Anemia Panel: No results for input(s): "VITAMINB12", "FOLATE", "FERRITIN", "TIBC", "IRON", "RETICCTPCT" in the last 72 hours. Sepsis Labs: Recent Labs  Lab 08/02/22 0004  LATICACIDVEN 1.6     No results found for this or any previous visit (from the past 240 hour(s)).   Radiology Studies: DG Abd Portable 1V  Result Date: 08/06/2022 CLINICAL DATA:  Abdominal pain and vomiting. EXAM: PORTABLE ABDOMEN - 1 VIEW COMPARISON:  08/05/2022 FINDINGS: Enteric tube has been removed from the stomach. A few mildly distended gas-filled loops of small bowel are again noted. Oral  contrast within the colon again identified. IMPRESSION: Enteric tube removed from the stomach. Otherwise unchanged appearance of the abdomen with a few mildly dilated gas-filled loops of small bowel again noted. Electronically Signed   By: Margarette Canada M.D.   On: 08/06/2022 11:22    Scheduled Meds:  insulin aspart  0-9 Units Subcutaneous Q4H   insulin glargine-yfgn  25 Units Subcutaneous Daily   latanoprost  1 drop Left Eye QHS   metoCLOPramide (REGLAN) injection  5 mg Intravenous Q8H   metoprolol tartrate  5 mg Intravenous Q8H   pantoprazole (PROTONIX) IV  40 mg Intravenous Q12H   Continuous Infusions:  sodium chloride 75 mL/hr at 08/06/22 5035   magnesium sulfate bolus IVPB     potassium chloride       LOS: 5 days   Darliss Cheney, MD [facility]  08/07/2022, 8:15 AM   *Please note that this is a verbal dictation therefore any spelling or grammatical errors are due to the "[place]" system interpretation.  Please page via [place] and do not message via secure chat for urgent patient care matters. Secure chat can be used for non urgent patient care matters.  How to contact the [facility] Attending or Consulting provider [place] or covering provider during after hours [place], for this patient?  Check the care team in [facility] and look for a) attending/consulting TRH provider listed and b) the [facility] team listed. Page or secure chat [phone]-[phone]. Log into [email or web address] and use [place]'s universal password to access. If you do not have the password, please contact the hospital operator. Locate the [facility] provider you are looking for under [facility] and page to a number that you can be directly reached. If you still have difficulty reaching the provider, please page the [facility] (Director on Call) for the Hospitalists listed on amion for assistance.

## 2022-08-07 NOTE — Plan of Care (Signed)
°  Problem: Nutrition: °Goal: Adequate nutrition will be maintained °Outcome: Progressing °  °Problem: Coping: °Goal: Level of anxiety will decrease °Outcome: Progressing °  °Problem: Safety: °Goal: Ability to remain free from injury will improve °Outcome: Progressing °  °

## 2022-08-07 NOTE — Progress Notes (Signed)
Orthopedic Tech Progress Note Patient Details:  Randy Liu [birth year] [id number]  Ortho Devices Type of Ortho Device: Haematologist Ortho Device/Splint Location: BLE Ortho Device/Splint Interventions: Ordered, Application, Adjustment   Post Interventions Patient Tolerated: Well Instructions Provided: Care of device  [facility] 08/07/2022, 10:43 AM

## 2022-08-08 DIAGNOSIS — I214 Non-ST elevation (NSTEMI) myocardial infarction: Secondary | ICD-10-CM | POA: Diagnosis not present

## 2022-08-08 LAB — MAGNESIUM: Magnesium: 1.5 mg/dL — ABNORMAL LOW (ref 1.7–2.4)

## 2022-08-08 LAB — GLUCOSE, CAPILLARY
Glucose-Capillary: 110 mg/dL — ABNORMAL HIGH (ref 70–99)
Glucose-Capillary: 114 mg/dL — ABNORMAL HIGH (ref 70–99)
Glucose-Capillary: 119 mg/dL — ABNORMAL HIGH (ref 70–99)
Glucose-Capillary: 146 mg/dL — ABNORMAL HIGH (ref 70–99)
Glucose-Capillary: 151 mg/dL — ABNORMAL HIGH (ref 70–99)
Glucose-Capillary: 171 mg/dL — ABNORMAL HIGH (ref 70–99)
Glucose-Capillary: 172 mg/dL — ABNORMAL HIGH (ref 70–99)

## 2022-08-08 LAB — BASIC METABOLIC PANEL
Anion gap: 10 (ref 5–15)
BUN: 9 mg/dL (ref 8–23)
CO2: 22 mmol/L (ref 22–32)
Calcium: 8.6 mg/dL — ABNORMAL LOW (ref 8.9–10.3)
Chloride: 105 mmol/L (ref 98–111)
Creatinine, Ser: 1 mg/dL (ref 0.61–1.24)
GFR, Estimated: >60 mL/min (ref >60)
Glucose, Bld: 123 mg/dL — ABNORMAL HIGH (ref 70–99)
Potassium: 3.5 mmol/L (ref 3.5–5.1)
Sodium: 137 mmol/L (ref 135–145)

## 2022-08-08 MED ORDER — ENOXAPARIN SODIUM 40 MG/0.4ML IJ SOSY
40.0000 mg | PREFILLED_SYRINGE | INTRAMUSCULAR | Status: DC
  Administered 2022-08-08 – 2022-08-11 (×4): 40 mg via SUBCUTANEOUS
  Filled 2022-08-08 (×4): qty 0.4

## 2022-08-08 MED ORDER — MAGNESIUM SULFATE 2 GM/50ML IV SOLN
2.0000 g | Freq: Once | INTRAVENOUS | Status: AC
  Administered 2022-08-08: 2 g via INTRAVENOUS
  Filled 2022-08-08: qty 50

## 2022-08-08 MED ORDER — SENNA 8.6 MG PO TABS
1.0000 | ORAL_TABLET | Freq: Every day | ORAL | Status: DC
  Administered 2022-08-08 – 2022-08-13 (×6): 8.6 mg via ORAL
  Filled 2022-08-08 (×6): qty 1

## 2022-08-08 MED ORDER — POTASSIUM CHLORIDE CRYS ER 20 MEQ PO TBCR
40.0000 meq | EXTENDED_RELEASE_TABLET | Freq: Once | ORAL | Status: AC
  Administered 2022-08-08: 40 meq via ORAL
  Filled 2022-08-08: qty 2

## 2022-08-08 NOTE — Progress Notes (Signed)
PROGRESS NOTE    Randy Liu  OBS:[id number] DOB: [birth year] DOA: 07/15/2022 PCP: [name], MD   Brief Narrative:  HPI: This is a 71 year old male with past medical history of DM2, DVT/PE on eliquis, HTN, HFrEF, ICM, multiple NSTEMIs over past several months.  11/17 he developed had an episode of nausea and vomiting.  Around 6:30 PM 11/18 he again started throwing up.  He took his Zofran, meclizine and his nighttime meds and everything came back up.  He had emesis all night.  He went to the pain between his shoulder blade, neck and shoulders.  He became diaphoretic.  911 was called.  He never had chest pains.   In the ER CT abdomen pelvis revealed small bowel obstruction.  Lab work revealed an initial troponin of 30, second troponin of 300.  EKG without acute ischemic changes.  Patient remains chest pain-free.   Cardiology contacted by ER physician via phone recommend ACS protocol.  They will see him in the a.m.   History provided by patient and wife who is present bedside.  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  NGT was clamped and eventually removed on 08/05/2022 and he was started on clear liquid diet, he was doing fine until morning of 08/06/2022 when he had large amount of bilious  vomiting and started complaining of abdominal pain and not passing flatus, repeat abdominal x-ray was done which was unremarkable.  Patient is improving, no nausea, passing flatus, no bowel movement in last 24 hours, I think his diet can be advanced to at least full liquid diet or baby soft diet but will defer to general surgery since they are managing.  CAD with previous history of PCI/recovered ischemic cardiomyopathy/chronic heart failure with preserved ejection fraction /NSTEMI vs Demand ischemia: No chest pain or shortness of breath.  Cardiology consulted, patient is asymptomatic and is on heparin which I will discontinue today since it has been 48 hours, as cardiology recommended.  Cardiac enzymes however went up to 700 yesterday.  Cardiology had reviewed him and basically mentioned that patient is not candidate for any intervention and signed off.  Echo ruled out wall motion abnormality.  History of GI ulcers s/p duodenal polyp clipping: Continue IV Protonix.  Hemoglobin stable.  Essential hypertension: Controlled.  Continue current medications.  Type 2 diabetes mellitus: Blood sugar controlled.  Continue Semglee 25 units and SSI.  Dyslipidemia/statin intolerance: Fenofibrate on hold.  Stage IIIa CKD: Stable at baseline.  Sleep apnea on CPAP at night at home.  Hypokalemia/hypomagnesemia: Potassium borderline low, magnesium is still low.  Will replace both of them.  DVT prophylaxis: SCDs Start: 08/02/22 0454   Code Status: Full Code  Family Communication: Wife present at bedside.  Plan of care discussed with patient in length and he/she verbalized understanding and  agreed with it.  Status is: Inpatient Remains inpatient appropriate because: Still with SBO.   Estimated body mass index is 39.59 kg/m as calculated from the following:   Height as of this encounter: 6' 1"  (1.854 m).   Weight as of this encounter: 136.1 kg.    Nutritional Assessment: Body mass index is 39.59  kg/m.Marland Kitchen Seen by dietician.  I agree with the assessment and plan as outlined below: Nutrition Status:        . Skin Assessment: I have examined the patient's skin and I agree with the wound assessment as performed by the wound care RN as outlined below:    Consultants:  Cardiology General surgery  Procedures:  As above  Antimicrobials:  Anti-infectives (From admission, onward)    None         Subjective:  Patient seen and examined, he has no complaints.  He is feeling better.  Objective: Vitals:   08/07/22 2238 08/08/22 0402 08/08/22 0759 08/08/22 0938  BP: 137/60 (!) 142/61 (!) 131/96   Pulse: 71 71  65  Resp: 16  14 16   Temp:  (!) 97.5 F (36.4 C) 98.1 F (36.7 C)   TempSrc:  Oral Oral   SpO2: 97% 96%  97%  Weight:  (!) 136.1 kg    Height:        Intake/Output Summary (Last 24 hours) at 08/08/2022 1017 Last data filed at 08/08/2022 0946 Gross per 24 hour  Intake 1770.06 ml  Output 1100 ml  Net 670.06 ml    Filed Weights   08/06/22 0409 08/07/22 0408 08/08/22 0402  Weight: 132 kg 134.4 kg (!) 136.1 kg    Examination:  General exam: Appears calm and comfortable, obese Respiratory system: Clear to auscultation. Respiratory effort normal. Cardiovascular system: S1 & S2 heard, RRR. No JVD, murmurs, rubs, gallops or clicks. No pedal edema. Gastrointestinal system: Abdomen is nondistended, soft and nontender. No organomegaly or masses felt.  Diminished bowel sounds. Central nervous system: Alert and oriented. No focal neurological deficits. Psychiatry: Judgement and insight appear normal. Mood & affect appropriate.    Data Reviewed: I have personally reviewed following labs and imaging studies  CBC: Recent Labs  Lab 08/02/22 0004 08/02/22 0630 08/03/22 0046 08/07/22 0040  WBC 11.2* 5.4 7.1 5.5  NEUTROABS 9.4* 3.3 3.7 3.4  HGB 14.5 14.0 13.5 11.3*  HCT 45.8 44.6 41.5 36.6*  MCV 94.2 94.7 95.2 94.6  PLT 270 261 245 287    Basic Metabolic  Panel: Recent Labs  Lab 08/02/22 0630 08/03/22 0046 08/04/22 0039 08/05/22 0111 08/07/22 0040 08/08/22 0819  NA 138 140 143 145 141 137  K 4.7 4.6 4.2 4.0 3.4* 3.5  CL 97* 101 103 105 108 105  CO2 25 24 29 23 22 22   GLUCOSE 255* 202* 133* 129* 109* 123*  BUN 34* 38* 35* 28* 14 9  CREATININE 2.09* 1.99* 1.87* 1.48* 1.19 1.00  CALCIUM 9.9 9.4 9.5 9.3 8.8* 8.6*  MG 2.4 2.4  --   --  1.6* 1.5*    GFR: Estimated Creatinine Clearance: 95.3 mL/min (by C-G formula based on SCr of 1 mg/dL). Liver Function Tests: Recent Labs  Lab 08/02/22 0004 08/02/22 0630  AST 23 24  ALT 23 23  ALKPHOS 39 40  BILITOT 0.7 0.7  PROT 7.1 6.8  ALBUMIN 3.5 3.3*    Recent Labs  Lab 08/02/22 0004  LIPASE 24    No results for input(s): "AMMONIA" in the last 168 hours. Coagulation Profile:  No results for input(s): "INR", "PROTIME" in the last 168 hours. Cardiac Enzymes: No results for input(s): "CKTOTAL", "CKMB", "CKMBINDEX", "TROPONINI" in the last 168 hours. BNP (last 3 results) No results for input(s): "PROBNP" in the last 8760 hours. HbA1C: No results for input(s): "HGBA1C" in the last 72 hours. CBG: Recent Labs  Lab 08/07/22 1638 08/07/22 2113 08/08/22 0003 08/08/22 0407 08/08/22 0757  GLUCAP 151* 142* 114* 110* 119*    Lipid Profile: No results for input(s): "CHOL", "HDL", "LDLCALC", "TRIG", "CHOLHDL", "LDLDIRECT" in the last 72 hours.  Thyroid Function Tests: No results for input(s): "TSH", "T4TOTAL", "FREET4", "T3FREE", "THYROIDAB" in the last 72 hours. Anemia Panel: No results for input(s): "VITAMINB12", "FOLATE", "FERRITIN", "TIBC", "IRON", "RETICCTPCT" in the last 72 hours. Sepsis Labs: Recent Labs  Lab 08/02/22 0004  LATICACIDVEN 1.6     No results found for this or any previous visit (from the past 240 hour(s)).   Radiology Studies: DG Abd Portable 1V  Result Date: 08/06/2022 CLINICAL DATA:  Abdominal pain and vomiting. EXAM: PORTABLE ABDOMEN - 1 VIEW  COMPARISON:  08/05/2022 FINDINGS: Enteric tube has been removed from the stomach. A few mildly distended gas-filled loops of small bowel are again noted. Oral contrast within the colon again identified. IMPRESSION: Enteric tube removed from the stomach. Otherwise unchanged appearance of the abdomen with a few mildly dilated gas-filled loops of small bowel again noted. Electronically Signed   By: Margarette Canada M.D.   On: 08/06/2022 11:22    Scheduled Meds:  insulin aspart  0-9 Units Subcutaneous Q4H   insulin glargine-yfgn  25 Units Subcutaneous Daily   latanoprost  1 drop Left Eye QHS   metoCLOPramide (REGLAN) injection  5 mg Intravenous Q8H   metoprolol tartrate  5 mg Intravenous Q8H   pantoprazole (PROTONIX) IV  40 mg Intravenous Q12H   Continuous Infusions:  sodium chloride 75 mL/hr at 08/07/22 1800     LOS: 6 days   Darliss Cheney, MD [facility]  08/08/2022, 10:17 AM   *Please note that this is a verbal dictation therefore any spelling or grammatical errors are due to the "[place] [employer]" system interpretation.  Please page via [place] and do not message via secure chat for urgent patient care matters. Secure chat can be used for non urgent patient care matters.  How to contact the [facility] Attending or Consulting provider [place] or covering provider during after hours [place], for this patient?  Check the care team in [facility] and look for a) attending/consulting TRH provider listed and b) the [facility] team listed. Page or secure chat [phone]. Log into [email or web address] and use [place] universal password to access. If you do not have the password, please contact the hospital operator. Locate the [facility] provider you are looking for under [facility] and page to a number that you can be directly reached. If you still have difficulty reaching the provider, please page the [facility] (Director on Call) for the Hospitalists listed on amion for assistance.

## 2022-08-08 NOTE — Progress Notes (Signed)
   Progress Note     Subjective: no further emesis or nausea.  Reports min flatus.  Wife at bedside. Pt HoH  Objective: Vital signs in last 24 hours: Temp:  [97.5 F (36.4 C)-98.1 F (36.7 C)] 98.1 F (36.7 C) (11/25 0759) Pulse Rate:  [65-71] 65 (11/25 0938) Resp:  [14-22] 16 (11/25 0938) BP: (131-142)/(53-96) 131/96 (11/25 0759) SpO2:  [96 %-97 %] 97 % (11/25 0938) Weight:  [136.1 kg] 136.1 kg (11/25 0402) Last BM Date : 08/04/22  Intake/Output from previous day: 11/24 0701 - 11/25 0700 In: 1770.1 [P.O.:120; I.V.:1225; IV Piggyback:425.1] Out: 600 [Urine:600] Intake/Output this shift: Total I/O In: -  Out: 500 [Urine:500]  PE: Gen:  Alert, NAD, pleasant Abd: obese, soft, no TTP, rebound or guarding   Lab Results:  Recent Labs    08/07/22 0040  WBC 5.5  HGB 11.3*  HCT 36.6*  PLT 207    BMET Recent Labs    08/07/22 0040 08/08/22 0819  NA 141 137  K 3.4* 3.5  CL 108 105  CO2 22 22  GLUCOSE 109* 123*  BUN 14 9  CREATININE 1.19 1.00  CALCIUM 8.8* 8.6*    PT/INR No results for input(s): "LABPROT", "INR" in the last 72 hours. CMP     Component Value Date/Time   NA 137 08/08/2022 0819   K 3.5 08/08/2022 0819   CL 105 08/08/2022 0819   CO2 22 08/08/2022 0819   GLUCOSE 123 (H) 08/08/2022 0819   BUN 9 08/08/2022 0819   CREATININE 1.00 08/08/2022 0819   CALCIUM 8.6 (L) 08/08/2022 0819   PROT 6.8 08/02/2022 0630   ALBUMIN 3.3 (L) 08/02/2022 0630   AST 24 08/02/2022 0630   ALT 23 08/02/2022 0630   ALKPHOS 40 08/02/2022 0630   BILITOT 0.7 08/02/2022 0630   GFRNONAA >60 08/08/2022 0819   Lipase     Component Value Date/Time   LIPASE 24 08/02/2022 0004       Studies/Results: DG Abd Portable 1V  Result Date: 08/06/2022 CLINICAL DATA:  Abdominal pain and vomiting. EXAM: PORTABLE ABDOMEN - 1 VIEW COMPARISON:  08/05/2022 FINDINGS: Enteric tube has been removed from the stomach. A few mildly distended gas-filled loops of small bowel are again  noted. Oral contrast within the colon again identified. IMPRESSION: Enteric tube removed from the stomach. Otherwise unchanged appearance of the abdomen with a few mildly dilated gas-filled loops of small bowel again noted. Electronically Signed   By: Margarette Canada M.D.   On: 08/06/2022 11:22    Anti-infectives: Anti-infectives (From admission, onward)    None        Assessment/Plan  Nausea, vomiting, enteritis vs psbo on top of chronic dysmotility issues   - SBO protocol repeated 11/20 - tolerating clears - Cont reglan.  - keep K >4.0 and Mg >2.0 to optimize bowel function  - pt on reglan at home for gastroparesis - increased to TID  - restart home med- senokot - advance to fulls - no indication for emergent surgical intervention    FEN - FLD, IVFs VTE - heparin gtt off, plavix/eliquis on hold, Lovenox SQ added ID - none currently needed   CAD NSTEMI - heparin x 48 hours CHF HTN HLD DM Bedbound Morbid obesity Chronic bowel dysmotility Peripheral neuropathy OSA   LOS: 6 days     Rosario Adie, MD [facility] 08/08/2022, 10:25 AM Please see Amion for pager number during day hours 7:00am-4:30pm

## 2022-08-08 NOTE — Progress Notes (Signed)
Refuses cpap

## 2022-08-09 DIAGNOSIS — I214 Non-ST elevation (NSTEMI) myocardial infarction: Secondary | ICD-10-CM | POA: Diagnosis not present

## 2022-08-09 LAB — GLUCOSE, CAPILLARY
Glucose-Capillary: 124 mg/dL — ABNORMAL HIGH (ref 70–99)
Glucose-Capillary: 132 mg/dL — ABNORMAL HIGH (ref 70–99)
Glucose-Capillary: 142 mg/dL — ABNORMAL HIGH (ref 70–99)
Glucose-Capillary: 164 mg/dL — ABNORMAL HIGH (ref 70–99)
Glucose-Capillary: 165 mg/dL — ABNORMAL HIGH (ref 70–99)
Glucose-Capillary: 174 mg/dL — ABNORMAL HIGH (ref 70–99)
Glucose-Capillary: 189 mg/dL — ABNORMAL HIGH (ref 70–99)

## 2022-08-09 LAB — MAGNESIUM: Magnesium: 1.7 mg/dL (ref 1.7–2.4)

## 2022-08-09 MED ORDER — PANTOPRAZOLE SODIUM 40 MG PO TBEC
40.0000 mg | DELAYED_RELEASE_TABLET | Freq: Two times a day (BID) | ORAL | Status: DC
  Administered 2022-08-09 – 2022-08-13 (×9): 40 mg via ORAL
  Filled 2022-08-09 (×9): qty 1

## 2022-08-09 MED ORDER — GUAIFENESIN-DM 100-10 MG/5ML PO SYRP
5.0000 mL | ORAL_SOLUTION | ORAL | Status: DC | PRN
  Administered 2022-08-09 – 2022-08-12 (×11): 5 mL via ORAL
  Filled 2022-08-09 (×12): qty 5

## 2022-08-09 MED ORDER — ALUM & MAG HYDROXIDE-SIMETH 200-200-20 MG/5ML PO SUSP
30.0000 mL | ORAL | Status: DC | PRN
  Administered 2022-08-09: 30 mL via ORAL
  Filled 2022-08-09 (×2): qty 30

## 2022-08-09 MED ORDER — BISACODYL 10 MG RE SUPP
10.0000 mg | Freq: Once | RECTAL | Status: AC
  Administered 2022-08-09: 10 mg via RECTAL
  Filled 2022-08-09: qty 1

## 2022-08-09 NOTE — Progress Notes (Signed)
PHARMACIST - PHYSICIAN COMMUNICATION  DR:   Doristine Bosworth  CONCERNING: IV to Oral Route Change Policy  RECOMMENDATION: This patient is receiving protonix by the intravenous route.  Based on criteria approved by the Pharmacy and Therapeutics Committee, the intravenous medication(s) is/are being converted to the equivalent oral dose form(s).   DESCRIPTION: These criteria include: The patient is eating (either orally or via tube) and/or has been taking other orally administered medications for a least 24 hours The patient has no evidence of active gastrointestinal bleeding or impaired GI absorption (gastrectomy, short bowel, patient on TNA or NPO).  If you have questions about this conversion, please contact the Pharmacy Department  []   [phone] )  [facility] []   [phone] )  [facility] [x]   [phone] )  [name] []   [phone] )  [facility] []   [phone] )  [facility]

## 2022-08-09 NOTE — Progress Notes (Signed)
Refuses cpap

## 2022-08-09 NOTE — Progress Notes (Signed)
   Progress Note     Subjective: Tolerating FLD without N/V/abdominal pain. Denies bloating. Still passing flatus but no BM.  Objective: Vital signs in last 24 hours: Temp:  [97.8 F (36.6 C)-98.1 F (36.7 C)] 97.8 F (36.6 C) (11/26 0413) Pulse Rate:  [62-83] 83 (11/26 0448) Resp:  [14-18] 16 (11/26 0413) BP: (120-148)/(61-96) 141/61 (11/26 0448) SpO2:  [94 %-99 %] 94 % (11/26 0448) Weight:  [137.8 kg] 137.8 kg (11/26 0413) Last BM Date : 08/07/22  Intake/Output from previous day: 11/25 0701 - 11/26 0700 In: -  Out: 1500 [Urine:1500] Intake/Output this shift: No intake/output data recorded.  PE: Gen:  Alert, NAD, pleasant Abd: obese, soft, no TTP, rebound or guarding   Lab Results:  Recent Labs    08/07/22 0040  WBC 5.5  HGB 11.3*  HCT 36.6*  PLT 207    BMET Recent Labs    08/07/22 0040 08/08/22 0819  NA 141 137  K 3.4* 3.5  CL 108 105  CO2 22 22  GLUCOSE 109* 123*  BUN 14 9  CREATININE 1.19 1.00  CALCIUM 8.8* 8.6*    PT/INR No results for input(s): "LABPROT", "INR" in the last 72 hours. CMP     Component Value Date/Time   NA 137 08/08/2022 0819   K 3.5 08/08/2022 0819   CL 105 08/08/2022 0819   CO2 22 08/08/2022 0819   GLUCOSE 123 (H) 08/08/2022 0819   BUN 9 08/08/2022 0819   CREATININE 1.00 08/08/2022 0819   CALCIUM 8.6 (L) 08/08/2022 0819   PROT 6.8 08/02/2022 0630   ALBUMIN 3.3 (L) 08/02/2022 0630   AST 24 08/02/2022 0630   ALT 23 08/02/2022 0630   ALKPHOS 40 08/02/2022 0630   BILITOT 0.7 08/02/2022 0630   GFRNONAA >60 08/08/2022 0819   Lipase     Component Value Date/Time   LIPASE 24 08/02/2022 0004       Studies/Results: No results found.  Anti-infectives: Anti-infectives (From admission, onward)    None        Assessment/Plan  Nausea, vomiting, enteritis vs psbo on top of chronic dysmotility issues   - SBO protocol repeated 11/20 - tolerating FLD - Cont reglan - pt on reglan at home for gastroparesis -  increased to TID - keep K >4.0 and Mg >2.0 to optimize bowel function  - restart home med- senokot - no indication for emergent surgical intervention  - add suppository today. Continue FLD this am. May advance to soft today pending bowel function   FEN - FLD, IVFs VTE - heparin gtt off, plavix/eliquis on hold, Lovenox SQ added ID - none currently needed   CAD NSTEMI - heparin x 48 hours CHF HTN HLD DM Bedbound Morbid obesity Chronic bowel dysmotility Peripheral neuropathy OSA   LOS: 7 days     Winferd Humphrey, [facility] 08/09/2022, 7:45 AM Please see Amion for pager number during day hours 7:00am-4:30pm

## 2022-08-09 NOTE — Plan of Care (Signed)
  Problem: Education: Goal: Individualized Educational Video(s) Outcome: Progressing   Problem: Activity: Goal: Ability to tolerate increased activity will improve Outcome: Progressing

## 2022-08-09 NOTE — Progress Notes (Signed)
PROGRESS NOTE    Randy Liu  IRC:[id number] DOB: 1951/01/08 DOA: 08/10/2022 PCP: [name], MD   Brief Narrative:  HPI: This is a 71 year old male with past medical history of DM2, DVT/PE on eliquis, HTN, HFrEF, ICM, multiple NSTEMIs over past several months.  11/17 he developed had an episode of nausea and vomiting.  Around 6:30 PM 11/18 he again started throwing up.  He took his Zofran, meclizine and his nighttime meds and everything came back up.  He had emesis all night.  He went to the pain between his shoulder blade, neck and shoulders.  He became diaphoretic.  911 was called.  He never had chest pains.   In the ER CT abdomen pelvis revealed small bowel obstruction.  Lab work revealed an initial troponin of 30, second troponin of 300.  EKG without acute ischemic changes.  Patient remains chest pain-free.   Cardiology contacted by ER physician via phone recommend ACS protocol.  They will see him in the a.m.   History provided by patient and wife who is present bedside.  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  NGT was clamped and eventually removed on 08/05/2022 and he was started on clear liquid diet, he was doing fine until morning of 08/06/2022 when he had large amount of bilious  vomiting and started complaining of abdominal pain and not passing flatus, repeat abdominal x-ray was done which was unremarkable.  Patient is improving, no nausea, passing flatus, but still no bowel movements, seen by surgery, suppository ordered, they want to continue him on full liquid diet until he has a BM.  CAD with previous history of PCI/recovered ischemic cardiomyopathy/chronic heart failure with preserved ejection fraction /NSTEMI vs Demand ischemia: No chest pain or shortness of breath.  Cardiology consulted, patient is asymptomatic and is on heparin which I will discontinue today since it has been 48 hours, as cardiology recommended.  Cardiac enzymes however went up to 700 yesterday.  Cardiology had reviewed him and basically mentioned that patient is not candidate for any intervention and signed off.  Echo ruled out wall motion abnormality.  History of GI ulcers s/p duodenal polyp clipping: Continue IV Protonix.  Hemoglobin stable.  Essential hypertension: Controlled.  Continue current medications.  Type 2 diabetes mellitus: Blood sugar controlled.  Continue Semglee 25 units and SSI.  Dyslipidemia/statin intolerance: Fenofibrate on hold.  Stage IIIa CKD: Stable at baseline.  Sleep apnea on CPAP at night at home.  Hypokalemia/hypomagnesemia: Potassium borderline low, magnesium is still low.  Will replace both of them.  DVT prophylaxis: enoxaparin (LOVENOX) injection 40 mg Start: 08/08/22 1115 SCDs Start: 08/02/22 0454   Code Status: Full Code  Family Communication: Wife present at bedside.  Plan of care discussed with patient in length and he/she verbalized understanding and agreed with  it.  Status is: Inpatient Remains inpatient appropriate because: Still with SBO.   Estimated body mass index is 40.08 kg/m as calculated from the following:   Height as of this encounter: 6' 1"  (1.854 m).   Weight as of this encounter: 137.8 kg.    Nutritional Assessment: Body mass index  is 40.08 kg/m.Marland Kitchen Seen by dietician.  I agree with the assessment and plan as outlined below: Nutrition Status:        . Skin Assessment: I have examined the patient's skin and I agree with the wound assessment as performed by the wound care RN as outlined below:    Consultants:  Cardiology General surgery  Procedures:  As above  Antimicrobials:  Anti-infectives (From admission, onward)    None         Subjective:  Seen and examined.  Eating breakfast.  Has no complaints.  Passing flatus, no nausea.  No abdominal pain.  Objective: Vitals:   08/09/22 0413 08/09/22 0448 08/09/22 0848 08/09/22 0852  BP: 139/63 (!) 141/61 (!) 114/48 119/65  Pulse: 78 83 69 74  Resp: 16  18 18   Temp: 97.8 F (36.6 C)  97.9 F (36.6 C)   TempSrc:   Oral   SpO2: 97% 94% 95% 95%  Weight: (!) 137.8 kg     Height:        Intake/Output Summary (Last 24 hours) at 08/09/2022 1103 Last data filed at 08/09/2022 1015 Gross per 24 hour  Intake 290 ml  Output 1000 ml  Net -710 ml    Filed Weights   08/07/22 0408 08/08/22 0402 08/09/22 0413  Weight: 134.4 kg (!) 136.1 kg (!) 137.8 kg    Examination:  General exam: Appears calm and comfortable, obese Respiratory system: Clear to auscultation. Respiratory effort normal. Cardiovascular system: S1 & S2 heard, RRR. No JVD, murmurs, rubs, gallops or clicks. No pedal edema. Gastrointestinal system: Abdomen is nondistended, soft and nontender. No organomegaly or masses felt.  Diminished bowel sounds. Central nervous system: Alert and oriented. No focal neurological deficits. Psychiatry: Judgement and insight appear normal. Mood & affect appropriate.    Data Reviewed: I have personally reviewed following labs and imaging studies  CBC: Recent Labs  Lab 08/03/22 0046 08/07/22 0040  WBC 7.1 5.5  NEUTROABS 3.7 3.4  HGB 13.5 11.3*  HCT 41.5 36.6*  MCV 95.2 94.6  PLT 245 606    Basic Metabolic Panel: Recent Labs  Lab 08/03/22 0046  08/04/22 0039 08/05/22 0111 08/07/22 0040 08/08/22 0819 08/09/22 0103  NA 140 143 145 141 137  --   K 4.6 4.2 4.0 3.4* 3.5  --   CL 101 103 105 108 105  --   CO2 24 29 23 22 22   --   GLUCOSE 202* 133* 129* 109* 123*  --   BUN 38* 35* 28* 14 9  --   CREATININE 1.99* 1.87* 1.48* 1.19 1.00  --   CALCIUM 9.4 9.5 9.3 8.8* 8.6*  --   MG 2.4  --   --  1.6* 1.5* 1.7    GFR: Estimated Creatinine Clearance: 95.9 mL/min (by C-G formula based on SCr of 1 mg/dL). Liver Function Tests: No results for input(s): "AST", "ALT", "ALKPHOS", "BILITOT", "PROT", "ALBUMIN" in the last 168 hours.  No results for input(s): "LIPASE", "AMYLASE" in the last 168 hours.  No results for input(s): "AMMONIA" in the last 168 hours. Coagulation Profile: No results for input(s): "INR", "PROTIME" in the last 168 hours. Cardiac Enzymes: No results  for input(s): "CKTOTAL", "CKMB", "CKMBINDEX", "TROPONINI" in the last 168 hours. BNP (last 3 results) No results for input(s): "PROBNP" in the last 8760 hours. HbA1C: No results for input(s): "HGBA1C" in the last 72 hours. CBG: Recent Labs  Lab 08/08/22 2055 08/08/22 2108 08/09/22 0006 08/09/22 0410 08/09/22 0744  GLUCAP 171* 146* 142* 124* 132*    Lipid Profile: No results for input(s): "CHOL", "HDL", "LDLCALC", "TRIG", "CHOLHDL", "LDLDIRECT" in the last 72 hours.  Thyroid Function Tests: No results for input(s): "TSH", "T4TOTAL", "FREET4", "T3FREE", "THYROIDAB" in the last 72 hours. Anemia Panel: No results for input(s): "VITAMINB12", "FOLATE", "FERRITIN", "TIBC", "IRON", "RETICCTPCT" in the last 72 hours. Sepsis Labs: No results for input(s): "PROCALCITON", "LATICACIDVEN" in the last 168 hours.   No results found for this or any previous visit (from the past 240 hour(s)).   Radiology Studies: No results found.  Scheduled Meds:  enoxaparin (LOVENOX) injection  40 mg Subcutaneous Q24H   insulin aspart  0-9 Units Subcutaneous Q4H   insulin  glargine-yfgn  25 Units Subcutaneous Daily   latanoprost  1 drop Left Eye QHS   metoCLOPramide (REGLAN) injection  5 mg Intravenous Q8H   metoprolol tartrate  5 mg Intravenous Q8H   pantoprazole (PROTONIX) IV  40 mg Intravenous Q12H   senna  1 tablet Oral Daily   Continuous Infusions:  sodium chloride 75 mL/hr at 08/08/22 1448     LOS: 7 days   Darliss Cheney, MD [facility]  08/09/2022, 11:03 AM   *Please note that this is a verbal dictation therefore any spelling or grammatical errors are due to the "[place] [employer]" system interpretation.  Please page via [place] and do not message via secure chat for urgent patient care matters. Secure chat can be used for non urgent patient care matters.  How to contact the [facility] Attending or Consulting provider [place] or covering provider during after hours [place], for this patient?  Check the care team in [facility] and look for a) attending/consulting TRH provider listed and b) the [facility] team listed. Page or secure chat [phone]. Log into [email or web address] and use [place]'s universal password to access. If you do not have the password, please contact the hospital operator. Locate the [facility] provider you are looking for under [facility] and page to a number that you can be directly reached. If you still have difficulty reaching the provider, please page the [facility] (Director on Call) for the Hospitalists listed on amion for assistance.

## 2022-08-10 DIAGNOSIS — R112 Nausea with vomiting, unspecified: Secondary | ICD-10-CM | POA: Diagnosis not present

## 2022-08-10 DIAGNOSIS — I214 Non-ST elevation (NSTEMI) myocardial infarction: Secondary | ICD-10-CM | POA: Diagnosis not present

## 2022-08-10 DIAGNOSIS — K56609 Unspecified intestinal obstruction, unspecified as to partial versus complete obstruction: Secondary | ICD-10-CM | POA: Diagnosis not present

## 2022-08-10 LAB — BASIC METABOLIC PANEL
Anion gap: 8 (ref 5–15)
BUN: 5 mg/dL — ABNORMAL LOW (ref 8–23)
CO2: 22 mmol/L (ref 22–32)
Calcium: 8.6 mg/dL — ABNORMAL LOW (ref 8.9–10.3)
Chloride: 107 mmol/L (ref 98–111)
Creatinine, Ser: 1.07 mg/dL (ref 0.61–1.24)
GFR, Estimated: >60 mL/min (ref >60)
Glucose, Bld: 172 mg/dL — ABNORMAL HIGH (ref 70–99)
Potassium: 3.4 mmol/L — ABNORMAL LOW (ref 3.5–5.1)
Sodium: 137 mmol/L (ref 135–145)

## 2022-08-10 LAB — GLUCOSE, CAPILLARY
Glucose-Capillary: 138 mg/dL — ABNORMAL HIGH (ref 70–99)
Glucose-Capillary: 134 mg/dL — ABNORMAL HIGH (ref 70–99)
Glucose-Capillary: 141 mg/dL — ABNORMAL HIGH (ref 70–99)
Glucose-Capillary: 124 mg/dL — ABNORMAL HIGH (ref 70–99)
Glucose-Capillary: 116 mg/dL — ABNORMAL HIGH (ref 70–99)
Glucose-Capillary: 109 mg/dL — ABNORMAL HIGH (ref 70–99)

## 2022-08-10 LAB — MAGNESIUM: Magnesium: 1.4 mg/dL — ABNORMAL LOW (ref 1.7–2.4)

## 2022-08-10 MED ORDER — METOCLOPRAMIDE HCL 5 MG/ML IJ SOLN
10.0000 mg | Freq: Three times a day (TID) | INTRAMUSCULAR | Status: DC
  Administered 2022-08-10 – 2022-08-12 (×5): 10 mg via INTRAVENOUS
  Filled 2022-08-10 (×4): qty 2

## 2022-08-10 MED ORDER — SACCHAROMYCES BOULARDII 250 MG PO CAPS
250.0000 mg | ORAL_CAPSULE | Freq: Two times a day (BID) | ORAL | Status: DC
  Administered 2022-08-10 – 2022-08-13 (×8): 250 mg via ORAL
  Filled 2022-08-10 (×8): qty 1

## 2022-08-10 MED ORDER — MAGNESIUM SULFATE 4 GM/100ML IV SOLN
4.0000 g | Freq: Once | INTRAVENOUS | Status: AC
  Administered 2022-08-10: 4 g via INTRAVENOUS
  Filled 2022-08-10: qty 100

## 2022-08-10 MED ORDER — POTASSIUM CHLORIDE CRYS ER 20 MEQ PO TBCR
40.0000 meq | EXTENDED_RELEASE_TABLET | Freq: Once | ORAL | Status: AC
  Administered 2022-08-10: 40 meq via ORAL
  Filled 2022-08-10: qty 2

## 2022-08-10 NOTE — Progress Notes (Signed)
PROGRESS NOTE    Randy Liu  ZOX:[id number] DOB: 12/08/1950 DOA: 07/31/2022 PCP: [name], MD   Brief Narrative:  HPI: This is a 71 year old male with past medical history of DM2, DVT/PE on eliquis, HTN, HFrEF, ICM, multiple NSTEMIs over past several months.  11/17 he developed had an episode of nausea and vomiting.  Around 6:30 PM 11/18 he again started throwing up.  He took his Zofran, meclizine and his nighttime meds and everything came back up.  He had emesis all night.  He went to the pain between his shoulder blade, neck and shoulders.  He became diaphoretic.  911 was called.  He never had chest pains.   In the ER CT abdomen pelvis revealed small bowel obstruction.  Lab work revealed an initial troponin of 30, second troponin of 300.  EKG without acute ischemic changes.  Patient remains chest pain-free.   Cardiology contacted by ER physician via phone recommend ACS protocol.  They will see him in the a.m.   History provided by patient and wife who is present bedside.  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  NGT was clamped and eventually removed on 08/05/2022 and he was started on clear liquid diet, he was doing fine until morning of 08/06/2022 when he had large amount of bilious  vomiting and started complaining of abdominal pain and not passing flatus, repeat abdominal x-ray was done which was unremarkable.  Patient received suppository yesterday, had a bowel movement as well but then after eating pudding last night, he had vomiting.  He has some nausea today but passing flatus.  General surgery has seen him and they believe that his symptoms are not likely secondary SBO but most likely secondary to chronic dysmotility and gastroparesis and recommend GI consultation.  She has been consulted.  He remains on Reglan scheduled and as needed Zofran.  CAD with previous history of PCI/recovered ischemic cardiomyopathy/chronic heart failure with preserved ejection fraction /NSTEMI vs Demand ischemia: No chest pain or shortness of breath.  Cardiology consulted, patient is asymptomatic and is on heparin which I will discontinue today since it has been 48 hours, as cardiology recommended.  Cardiac enzymes however went up to 700 yesterday.  Cardiology had reviewed him and basically mentioned that patient is not candidate for any intervention and signed off.  Echo ruled out wall motion abnormality.  History of GI ulcers s/p duodenal polyp clipping: Continue IV Protonix.  Hemoglobin stable.  Essential hypertension: Controlled.  Continue current medications.  Type 2 diabetes mellitus: Blood sugar controlled.  Continue Semglee 25 units and SSI.  Dyslipidemia/statin intolerance: Fenofibrate on hold.  Stage IIIa CKD: Stable at baseline.  Sleep apnea on CPAP at night at home.  Hypokalemia/hypomagnesemia: Low again we will replace both of them.  DVT prophylaxis: enoxaparin (LOVENOX) injection  40 mg Start: 08/08/22 1115 SCDs Start: 08/02/22 0454   Code Status: Full Code  Family Communication: Wife present at bedside.  Plan of care discussed with patient in length and he/she verbalized understanding and agreed with it.  Status is: Inpatient Remains inpatient appropriate because: Still  with SBO/gastric dysmotility/symptomatic.   Estimated body mass index is 39.53 kg/m as calculated from the following:   Height as of this encounter: 6' 1"  (1.854 m).   Weight as of this encounter: 135.9 kg.    Nutritional Assessment: Body mass index is 39.53 kg/m.Marland Kitchen Seen by dietician.  I agree with the assessment and plan as outlined below: Nutrition Status:        . Skin Assessment: I have examined the patient's skin and I agree with the wound assessment as performed by the wound care RN as outlined below:    Consultants:  Cardiology General surgery  Procedures:  As above  Antimicrobials:  Anti-infectives (From admission, onward)    None         Subjective:  Patient seen and examined.  Had vomiting last night after eating pudding, nauseous this morning.  But passing flatus.  Had a bowel movement yesterday as well.  Objective: Vitals:   08/09/22 1621 08/09/22 1951 08/10/22 0358 08/10/22 0820  BP: (!) 151/77 (!) 160/86 (!) 124/59 129/64  Pulse: 73 95 80 77  Resp: 20 20 18 20   Temp: (!) 97.5 F (36.4 C) 97.9 F (36.6 C) 97.7 F (36.5 C)   TempSrc: Oral Oral Oral   SpO2: 94% 93% 95% 94%  Weight:   135.9 kg   Height:        Intake/Output Summary (Last 24 hours) at 08/10/2022 1021 Last data filed at 08/09/2022 2351 Gross per 24 hour  Intake 340 ml  Output 1001 ml  Net -661 ml    Filed Weights   08/08/22 0402 08/09/22 0413 08/10/22 0358  Weight: (!) 136.1 kg (!) 137.8 kg 135.9 kg    Examination:  General exam: Appears calm and comfortable, obese Respiratory system: Clear to auscultation. Respiratory effort normal. Cardiovascular system: S1 & S2 heard, RRR. No JVD, murmurs, rubs, gallops or clicks. No pedal edema. Gastrointestinal system: Abdomen is nondistended, soft and nontender. No organomegaly or masses felt. Normal bowel sounds heard. Central nervous system: Alert and oriented. No focal neurological deficits. Skin: No rashes, lesions or  ulcers.  Psychiatry: Judgement and insight appear normal. Mood & affect appropriate.    Data Reviewed: I have personally reviewed following labs and imaging studies  CBC: Recent Labs  Lab 08/07/22 0040  WBC 5.5  NEUTROABS 3.4  HGB 11.3*  HCT 36.6*  MCV 94.6  PLT 378    Basic Metabolic Panel: Recent Labs  Lab 08/04/22 0039 08/05/22 0111 08/07/22 0040 08/08/22 0819 08/09/22 0103 08/10/22 0051  NA 143 145 141 137  --  137  K 4.2 4.0 3.4* 3.5  --  3.4*  CL 103 105 108 105  --  107  CO2 29 23 22 22   --  22  GLUCOSE 133* 129* 109* 123*  --  172*  BUN 35* 28* 14 9  --  5*  CREATININE 1.87* 1.48* 1.19 1.00  --  1.07  CALCIUM 9.5 9.3 8.8* 8.6*  --  8.6*  MG  --   --  1.6* 1.5* 1.7 1.4*    GFR: Estimated Creatinine Clearance: 89 mL/min (by C-G formula based on SCr of 1.07 mg/dL). Liver Function Tests: No results for input(s): "AST", "  ALT", "ALKPHOS", "BILITOT", "PROT", "ALBUMIN" in the last 168 hours.  No results for input(s): "LIPASE", "AMYLASE" in the last 168 hours.  No results for input(s): "AMMONIA" in the last 168 hours. Coagulation Profile: No results for input(s): "INR", "PROTIME" in the last 168 hours. Cardiac Enzymes: No results for input(s): "CKTOTAL", "CKMB", "CKMBINDEX", "TROPONINI" in the last 168 hours. BNP (last 3 results) No results for input(s): "PROBNP" in the last 8760 hours. HbA1C: No results for input(s): "HGBA1C" in the last 72 hours. CBG: Recent Labs  Lab 08/09/22 1618 08/09/22 1951 08/09/22 2347 08/10/22 0355 08/10/22 0728  GLUCAP 165* 189* 174* 138* 134*    Lipid Profile: No results for input(s): "CHOL", "HDL", "LDLCALC", "TRIG", "CHOLHDL", "LDLDIRECT" in the last 72 hours.  Thyroid Function Tests: No results for input(s): "TSH", "T4TOTAL", "FREET4", "T3FREE", "THYROIDAB" in the last 72 hours. Anemia Panel: No results for input(s): "VITAMINB12", "FOLATE", "FERRITIN", "TIBC", "IRON", "RETICCTPCT" in the last 72 hours. Sepsis  Labs: No results for input(s): "PROCALCITON", "LATICACIDVEN" in the last 168 hours.   No results found for this or any previous visit (from the past 240 hour(s)).   Radiology Studies: No results found.  Scheduled Meds:  enoxaparin (LOVENOX) injection  40 mg Subcutaneous Q24H   insulin aspart  0-9 Units Subcutaneous Q4H   insulin glargine-yfgn  25 Units Subcutaneous Daily   latanoprost  1 drop Left Eye QHS   metoCLOPramide (REGLAN) injection  5 mg Intravenous Q8H   metoprolol tartrate  5 mg Intravenous Q8H   pantoprazole  40 mg Oral BID   saccharomyces boulardii  250 mg Oral BID   senna  1 tablet Oral Daily   Continuous Infusions:  sodium chloride 75 mL/hr at 08/09/22 1738   magnesium sulfate bolus IVPB 4 g (08/10/22 0926)     LOS: 8 days   Darliss Cheney, MD [facility]  08/10/2022, 10:21 AM   *Please note that this is a verbal dictation therefore any spelling or grammatical errors are due to the "[place] One" system interpretation.  Please page via [place] and do not message via secure chat for urgent patient care matters. Secure chat can be used for non urgent patient care matters.  How to contact the [facility] Attending or Consulting provider [place] or covering provider during after hours [place], for this patient?  Check the care team in [facility] and look for a) attending/consulting TRH provider listed and b) the [facility] team listed. Page or secure chat [phone]. Log into [email or web address] and use [place]'s universal password to access. If you do not have the password, please contact the hospital operator. Locate the [facility] provider you are looking for under [facility] and page to a number that you can be directly reached. If you still have difficulty reaching the provider, please page the [facility] (Director on Call) for the Hospitalists listed on amion for assistance.

## 2022-08-10 NOTE — Plan of Care (Signed)
  Problem: Education: Goal: Individualized Educational Video(s) Outcome: Progressing   Problem: Activity: Goal: Ability to tolerate increased activity will improve Outcome: Progressing   Problem: Cardiac: Goal: Ability to achieve and maintain adequate cardiovascular perfusion will improve Outcome: Progressing

## 2022-08-10 NOTE — Consult Note (Addendum)
[facility] Consult: 11:55 AM 08/10/2022  LOS: 8 days    Referring Provider: Dr Ralene Ok, surgeon.    Primary Care Physician:  [name], MD Primary Gastroenterologist:  Althia Forts.  As an inpatient he has been seen by several GI groups in town, [place], [place] and Dr. Lyndel Safe.  As outpatient has seen Dr. Carlton Adam at [facility] 12/2019.    Reason for Consultation:  intermittent NV in pt w gastroparesis and hx PUD   HPI: Randy Liu is a 71 y.o. male.  PMH  IDDM.  HF pEF, diastolic.  Marland Kitchen  Hyperlipidemia.  Hypertriglyceridemia.  GERD.  DVT/PE.  Chronic Eliquis and Plavix.  COVID-19 infection early 2021.  Obesity. Bed/wheelchair bound due to peripheral neuropathy. GI past hx gastroparesis, cholangitis.  Umbilical hernia repair. Cholecystectomy.    Ileus versus PSBO in 08/2021, suspicion of intestinal dysmotility.. 12/13/2020 abdominal ultrasound: Fatty liver.  Gallbladder sludge.  6.5 mm CBD. 12/13/2020 MRI/MRCP: gallbladder and CBD sludge.  CBD 6 to 7 mm.  Normal intrahepatic ducts.  Heterogeneous liver enhancement.  Cystic lesions of the pancreas, largest 12 mm in size, query small sidebranch IPMN's, suggest MR RI/MRCP in 2 years 12/14/2020 ERCP (Dr Lyndel Safe as courtesy for [facility]): Performed sphincterotomy and balloon extraction of CBD stone with observation of a sending cholangitis and periampullary diverticula. Cystic duct patent, cholelithiasis. 12/17/2020 laparoscopic cholecystectomy and liver biopsy.  Dr Harlow Asa.  Path: Liver with mildly active steatohepatitis hepatitis, grade 1/3 stage I-II/IV of portal and centrilobular fibrosis w fibrous septa.   05/08/2021 colonoscopy.  For hematochezia.  Dr Benson Norway. 15 mm polyp removed at ascending colon, 2 subcentimeter polyps removed from the  descending and ascending colon.  Path: Tubular adenomas, benign colonic mucosa, no high-grade dysplasia. 08/2021 EGD.  Dr. Rush Landmark, 4 unexplained chest pain, nausea/vomiting, indigestion, gastroparesis.  Study revealed tortuous esophagus but normal esophageal mucosa, regular Z-line.  Clean-based, nonbleeding gastric ulcers x 2, gastritis with nodular erosions.  Pylorus normal.  Solitary duodenal polyp was resected, retrieved and site endoclipped, duodenal diverticulum at major papilla.  After this 1 month of Carafate was added to bid PPI.  Esophageal pathology showed unremarkable squamous mucosa, no increased eosinophils.   Gastric pathology showed hyperemia, no H. pylori.  Duodenal polyp showed mild Brunner's gland hyperplasia but no dysplasia or adenomatous change.  Duodenal biopsies with intact villous architecture.  Home meds include monthly B12 injections, Protonix 40 mg bid, Pepcid complete prn, MiraLAX prn, Zofran prn, Align, Sucralfate bid  At baseline has periodic vomiting with and without nausea.  Often it sounds more like regurgitation.  He retches a lot with dry heaves and then eventually foamy clear material is produced.  Rarely sees partially digested food.  These are chronic issues and plan by PCP was referral to GI at [facility] in [place].  However his symptoms got better and since it is such a production to get him transferred to an outpatient facility for office visit, he and his wife decided not to make the GI appointment.  Starting on the weekend last, so a week ago,  the nausea and vomiting have been persistent.  Came to the ED. 08/02/2022 CTAP wo contrast: Mild hepatic steatosis.  Ducts normal.  Surgically absent gallbladder. Mild dilatation and segments of ileum interspersed with collapsed segments in the anterior mid to lower abdomen.  No transition point or overt obstruction.  Thickened gastric folds C/W gastritis.  Constipation.  Diverticulosis.  Pancreas diffusely atrophic  w 12 x 12 mm cystic lesion at uncinate process of pancreas and 7 mm cystic lesion in anterior body of pancreas seen on MRI are not present on this CT.  Small fat-containing inguinal hernias.  Non-GI issues of small pericardial effusion, aortic and coronary atherosclerosis, osteopenia and degenerative spine changes. Multiple follow-up portable abdomen films demonstrating persistent and stable dilatation and several loops of small bowel.   Potassium a bit low at 3.4.  Renal function okay. Troponin I 200..702..540.  BNP 155..142. INR 1.3.   A1c 7.4  TSH normal.   Hgb 11.3, MCV 94.  An NG tube was in place from arrival on Sunday through Tuesday.  Maximum output was 2.1 L.  Was tolerating clear liquids after NG tube removed so diet advanced to full liquids yesterday.  Within a few hours of oral intake, vomiting up mostly foamy clear phlegm, rarely vomiting up partially digested food.  No blood in the emesis.  No abdominal pain.  At baseline bowel habits are every 2 to 3 days.  His wife uses MiraLAX as needed with good effect.  Stools are brown, no blood, no melena.  Patient has also had increased dry cough and after he has been coughing a lot, he experiences abdominal muscular soreness but no visceral abdominal pain. 1 stool recorded yesterday. Current diet is full liquid.    Family history pertinent for nonspecific stomach complaints but no perforated ulcer, bleeding ulcers, gastric or intestinal cancers.` Lives at home with his wife in [place].  No alcohol, no cigarettes.  Past Medical History:  Diagnosis Date   Anal fissure    Asthma    Cardiomyopathy ([place])    CHF (congestive heart failure) ([place])    Coronary artery disease    Diabetes mellitus without complication ([place])    History of DVT (deep vein thrombosis)    History of ulcer disease    Hyperlipidemia    Hypertension    Peripheral neuropathy    Sleep apnea     Past Surgical History:  Procedure Laterality Date   BIOPSY   12/14/2020   Procedure: BIOPSY;  Surgeon: Jackquline Denmark, MD;  Location: [facility];  Service: Endoscopy;;   BIOPSY  09/05/2021   Procedure: BIOPSY;  Surgeon: Irving Copas., MD;  Location: [facility];  Service: Gastroenterology;;   CHOLECYSTECTOMY N/A 12/17/2020   Procedure: LAPAROSCOPIC CHOLECYSTECTOMY, LIVER BIOPSY, PRIMARY UMBILICAL HERNIA REPAIR;  Surgeon: Armandina Gemma, MD;  Location: [facility];  Service: General;  Laterality: N/A;   COLONOSCOPY WITH PROPOFOL N/A 05/08/2021   Procedure: COLONOSCOPY WITH PROPOFOL;  Surgeon: Carol Ada, MD;  Location: [facility];  Service: Endoscopy;  Laterality: N/A;   CORONARY STENT INTERVENTION N/A 11/21/2021   Procedure: CORONARY STENT INTERVENTION;  Surgeon: Nigel Mormon, MD;  Location: [facility];  Service: Cardiovascular;  Laterality: N/A;   ENDOSCOPIC RETROGRADE CHOLANGIOPANCREATOGRAPHY (ERCP) WITH PROPOFOL N/A 12/14/2020   Procedure: ENDOSCOPIC RETROGRADE CHOLANGIOPANCREATOGRAPHY (ERCP) WITH PROPOFOL;  Surgeon: Jackquline Denmark, MD;  [facility]: [facility];  Service: Endoscopy;  Laterality: N/A;   ESOPHAGOGASTRODUODENOSCOPY N/A 09/05/2021   Procedure: ESOPHAGOGASTRODUODENOSCOPY (EGD);  Surgeon: Irving Copas., MD;  [facility]: [facility];  Service: Gastroenterology;  Laterality: N/A;   HEMOSTASIS CLIP PLACEMENT  09/05/2021   Procedure: HEMOSTASIS CLIP PLACEMENT;  Surgeon: Irving Copas., MD;  Location: [facility];  Service: Gastroenterology;;   INTRAVASCULAR ULTRASOUND/IVUS N/A 11/21/2021   Procedure: Intravascular Ultrasound/IVUS;  Surgeon: Nigel Mormon, MD;  Location: [facility];  Service: Cardiovascular;  Laterality: N/A;   LEFT HEART CATH AND CORONARY ANGIOGRAPHY N/A 08/12/2021   Procedure: LEFT HEART CATH AND CORONARY ANGIOGRAPHY;  Surgeon: Adrian Prows, MD;  Location: [facility];  Service: Cardiovascular;  Laterality: N/A;   LEFT HEART CATH AND CORONARY ANGIOGRAPHY N/A 11/21/2021    Procedure: LEFT HEART CATH AND CORONARY ANGIOGRAPHY;  Surgeon: Nigel Mormon, MD;  Location: [facility];  Service: Cardiovascular;  Laterality: N/A;   POLYPECTOMY  05/08/2021   Procedure: POLYPECTOMY;  Surgeon: Carol Ada, MD;  [facility];  Service: Endoscopy;;   POLYPECTOMY  09/05/2021   Procedure: POLYPECTOMY;  Surgeon: Irving Copas., MD;  Location: [facility];  Service: Gastroenterology;;   REMOVAL OF STONES  12/14/2020   Procedure: REMOVAL OF STONES;  Surgeon: Jackquline Denmark, MD;  [facility];  Service: Endoscopy;;   SPHINCTEROTOMY  12/14/2020   Procedure: Joan Mayans;  Surgeon: Jackquline Denmark, MD;  [facility];  Service: Endoscopy;;    Prior to Admission medications   Medication Sig Start Date End Date Taking? Authorizing Provider  albuterol (VENTOLIN HFA) 108 (90 Base) MCG/ACT inhaler Inhale 2 puffs into the lungs every 6 (six) hours as needed for wheezing or shortness of breath.   Yes [provider]  Alpha-Lipoic Acid 300 MG TABS Take 600 mg by mouth daily.   Yes [provider]  benzonatate (TESSALON) 200 MG capsule Take 200 mg by mouth every 8 (eight) hours as needed for cough. 11/18/20  Yes [provider]  Cholecalciferol 25 MCG (1000 UT) capsule Take 4,000 Units by mouth daily.   Yes [provider]  clopidogrel (PLAVIX) 75 MG tablet TAKE ONE (1) TABLET BY MOUTH EVERY DAY 07/23/22  Yes Tolia, Sunit, DO  colchicine-probenecid 0.5-500 MG tablet Take 1 tablet by mouth daily. 11/21/20  Yes [provider]  cyanocobalamin (,VITAMIN B-12,) 1000 MCG/ML injection Inject 1,000 mcg into the skin every 30 (thirty) days. 09/23/20  Yes [provider]  ELIQUIS 5 MG TABS tablet Take 1 tablet (5 mg total) by mouth 2 (two) times daily. Resume on 05/11/21 Patient taking differently: Take 5 mg by mouth 2 (two) times daily. 05/11/21  Yes Kc, Maren Beach, MD  Evolocumab (REPATHA SURECLICK) 025 MG/ML  SOAJ Inject 1 mL into the skin every 14 (fourteen) days. 04/29/22  Yes Tolia, Sunit, DO  famotidine-calcium carbonate-magnesium hydroxide (PEPCID COMPLETE) 10-800-165 MG chewable tablet Chew 1 tablet by mouth daily as needed (heartburn).   Yes [provider]  fenofibrate 160 MG tablet Take 160 mg by mouth daily. 12/04/20  Yes [provider]  fluticasone (FLONASE) 50 MCG/ACT nasal spray Place 1 spray into both nostrils daily as needed for allergies. 07/08/21  Yes [provider]  guaiFENesin-codeine 100-10 MG/5ML syrup Take 5 mLs by mouth 3 (three) times daily as needed for cough. 04/07/22  Yes [provider]  insulin aspart protamine- aspart (NOVOLOG MIX 70/30) (70-30) 100 UNIT/ML injection Inject 40-85 Units into the skin 2 (two) times daily. Sliding scale:  100-140 : 40 units  141-180 : 45 units  181-220 : 50 units  221-260 : 55 units  261-300 :  60 units  301-340 : 65 units  341-380 : 70 units  381-420 : 75 units  421-460 : 80 units  461-500 : 85 units - recheck in 1 hour and notify MD   Yes [provider]  latanoprost (XALATAN) 0.005 % ophthalmic solution Place 1 drop into the left eye at bedtime. 10/29/20  Yes [provider]  loratadine (CLARITIN) 10 MG tablet Take 10 mg by mouth daily. 01/20/22  Yes [provider]  magnesium oxide (MAG-OX) 400 MG tablet Take 1 tablet (400 mg total) by mouth 3 (three) times daily. 03/18/22  Yes Tolia, Sunit, DO  meclizine (ANTIVERT) 25 MG tablet Take 25 mg by mouth 3 (three) times daily as needed for nausea.   Yes [provider]  melatonin 5 MG TABS Take 5 mg by mouth at bedtime.   Yes [provider]  metoCLOPramide (REGLAN) 5 MG tablet Take 5 mg by mouth at bedtime. 10/09/20  Yes [provider]  metoprolol succinate (TOPROL-XL) 25 MG 24 hr tablet Take 0.5 tablets (12.5 mg total) by mouth daily. 05/07/22  Yes Tolia, Sunit, DO  montelukast (SINGULAIR) 10 MG tablet Take  10 mg by mouth at bedtime. 09/28/20  Yes [provider]  nitroGLYCERIN (NITROSTAT) 0.4 MG SL tablet Place 0.4 mg under the tongue every 5 (five) minutes as needed for chest pain.   Yes [provider]  ondansetron (ZOFRAN-ODT) 4 MG disintegrating tablet Take 1 tablet (4 mg total) by mouth every 8 (eight) hours as needed for nausea or vomiting. 09/05/21  Yes Sharen Hones, MD  pantoprazole (PROTONIX) 40 MG tablet Take 1 tablet (40 mg total) by mouth 2 (two) times daily. 09/05/21  Yes Sharen Hones, MD  polyethylene glycol (MIRALAX / GLYCOLAX) 17 g packet Take 17 g by mouth 2 (two) times daily. Patient taking differently: Take 17 g by mouth 2 (two) times daily as needed for mild constipation. 11/27/21  Yes Pokhrel, Laxman, MD  Probiotic Product (ALIGN) 4 MG CAPS Take 4 mg by mouth daily.   Yes [provider]  senna (SENOKOT) 8.6 MG TABS tablet Take 1 tablet by mouth daily. 07/03/22  Yes [provider]  sucralfate (CARAFATE) 1 GM/10ML suspension Take 10 mLs (1 g total) by mouth 2 (two) times daily. 09/05/21  Yes Sharen Hones, MD  torsemide (DEMADEX) 10 MG tablet Take 0.5 tablets (5 mg total) by mouth daily. 04/15/22  Yes Tolia, Sunit, DO  VASCEPA 1 g capsule TAKE ONE TABLET BY MOUTH TWICE DAILY 04/07/22  Yes Tolia, Sunit, DO  sodium chloride (MURO 128) 5 % ophthalmic ointment Place 1 application  into both eyes at bedtime as needed for eye irritation.    [provider]    Scheduled Meds:  enoxaparin (LOVENOX) injection  40 mg Subcutaneous Q24H   insulin aspart  0-9 Units Subcutaneous Q4H   insulin glargine-yfgn  25 Units Subcutaneous Daily   latanoprost  1 drop Left Eye QHS   metoCLOPramide (REGLAN) injection  5 mg Intravenous Q8H   metoprolol tartrate  5 mg Intravenous Q8H   pantoprazole  40 mg Oral BID   saccharomyces boulardii  250 mg Oral BID   senna  1 tablet Oral Daily   Infusions:  sodium chloride 75 mL/hr at 08/09/22 1738   PRN  Meds: acetaminophen, alum & mag hydroxide-simeth, guaiFENesin-dextromethorphan, nitroGLYCERIN, ondansetron (ZOFRAN) IV, phenol   Allergies as of 08/13/2022 - Review Complete 06/05/2022  Allergen Reaction Noted   Nsaids Other (See Comments) 01/22/2014  Reglan [metoclopramide] Itching 08/21/2016   Augmentin [amoxicillin-pot clavulanate] Diarrhea and Nausea And Vomiting 12/04/2014   Statins Other (See Comments) 04/19/2015   Chocolate Itching 04/16/2022   Other Other (See Comments) 04/16/2022   Spiriva respimat [tiotropium bromide monohydrate] Other (See Comments) 04/16/2022   Strawberry (diagnostic) Itching 04/16/2022   Jardiance [empagliflozin] Other (See Comments) 06/30/2018   Zyloprim [allopurinol] Rash 09/30/2015    Family History  Problem Relation Age of Onset   Heart failure Mother    Heart disease Mother    Cancer Father     Social History   Socioeconomic History   Marital status: Married    Spouse name: Not on file   Number of children: 0   Years of education: Not on file   Highest education level: Not on file  Occupational History   Not on file  Tobacco Use   Smoking status: Never   Smokeless tobacco: Never  Vaping Use   Vaping Use: Never used  Substance and Sexual Activity   Alcohol use: Not Currently   Drug use: Never   Sexual activity: Not on file  Other Topics Concern   Not on file  Social History Narrative   Not on file   Social Determinants of Health   Financial Resource Strain: Not on file  Food Insecurity: No Food Insecurity (08/02/2022)   Hunger Vital Sign    Worried About Running Out of Food in the Last Year: Never true    Ran Out of Food in the Last Year: Never true  Transportation Needs: No Transportation Needs (08/02/2022)   PRAPARE - Hydrologist (Medical): No    Lack of Transportation (Non-Medical): No  Physical Activity: Not on file  Stress: Not on file  Social Connections: Not on file  Intimate Partner  Violence: Not At Risk (08/02/2022)   Humiliation, Afraid, Rape, and Kick questionnaire    Fear of Current or Ex-Partner: No    Emotionally Abused: No    Physically Abused: No    Sexually Abused: No    REVIEW OF SYSTEMS: Constitutional: No weakness, no fatigue. ENT: Diminished hearing.  No nose bleeds Pulm: Dry cough.  No dyspnea. CV:  No palpitations.  Chronic LE edema.  No angina. GU:  No hematuria, no frequency.  No oliguria. GI: As per HPI. Heme: Denies unusual or excessive bleeding or bruising Transfusions: None Neuro:  No headaches, no seizures, no syncope Derm:  No itching, no rash or sores.  Endocrine:  No sweats or chills.  No polyuria or dysuria Immunization: Reviewed. Travel:  None beyond local counties in last few months.    PHYSICAL EXAM: Vital signs in last 24 hours: Vitals:   08/10/22 0358 08/10/22 0820  BP: (!) 124/59 129/64  Pulse: 80 77  Resp: 18 20  Temp: 97.7 F (36.5 C)   SpO2: 95% 94%   Wt Readings from Last 3 Encounters:  08/10/22 135.9 kg  04/27/22 126.6 kg  04/19/22 127 kg    General: Patient is obese, resting comfortably on bed.  Does not look ill but he is pale. Head: No facial asymmetry or swelling.  No signs of head trauma. Eyes: Conjunctiva pink.  No scleral icterus.  EOMI Ears: Hard of hearing Nose: No congestion or discharge Mouth: Oral mucosa is pink, moist, clear.  Tongue midline. Neck: No JVD, no masses, no thyromegaly Lungs: Clear bilaterally without labored breathing or cough. Heart: RRR.  No MRG.  S1, S2 present Abdomen: Soft, obese.  Not  distended or protuberant.  No HSM, masses, bruits, hernias.  No tenderness.  Bowel sounds normal active..   Rectal: Deferred Musc/Skeltl: No joint redness, swelling, gross deformity. Extremities: No CCE. Neurologic: Oriented x 3.  Good historian.  Moves all 4 limbs, strength not tested.  No tremor. Skin: Pale.  No significant bruising.  No AVMs or rash. Nodes: No cervical adenopathy Psych:  Cooperative, calm, pleasant.  Fluid speech.  Intake/Output from previous day: 11/26 0701 - 11/27 0700 In: 46 [P.O.:630] Out: 1001 [Urine:1000; Stool:1] Intake/Output this shift: Total I/O In: 50 [P.O.:50] Out: -   LAB RESULTS: No results for input(s): "WBC", "HGB", "HCT", "PLT" in the last 72 hours. BMET Lab Results  Component Value Date   NA 137 08/10/2022   NA 137 08/08/2022   NA 141 08/07/2022   K 3.4 (L) 08/10/2022   K 3.5 08/08/2022   K 3.4 (L) 08/07/2022   CL 107 08/10/2022   CL 105 08/08/2022   CL 108 08/07/2022   CO2 22 08/10/2022   CO2 22 08/08/2022   CO2 22 08/07/2022   GLUCOSE 172 (H) 08/10/2022   GLUCOSE 123 (H) 08/08/2022   GLUCOSE 109 (H) 08/07/2022   BUN 5 (L) 08/10/2022   BUN 9 08/08/2022   BUN 14 08/07/2022   CREATININE 1.07 08/10/2022   CREATININE 1.00 08/08/2022   CREATININE 1.19 08/07/2022   CALCIUM 8.6 (L) 08/10/2022   CALCIUM 8.6 (L) 08/08/2022   CALCIUM 8.8 (L) 08/07/2022   LFT No results for input(s): "PROT", "ALBUMIN", "AST", "ALT", "ALKPHOS", "BILITOT", "BILIDIR", "IBILI" in the last 72 hours. PT/INR Lab Results  Component Value Date   INR 1.3 (H) 08/31/2021   INR 1.2 08/09/2021   INR 1.4 (H) 12/13/2020   Hepatitis Panel No results for input(s): "HEPBSAG", "HCVAB", "HEPAIGM", "HEPBIGM" in the last 72 hours. C-Diff No components found for: "CDIFF" Lipase     Component Value Date/Time   LIPASE 24 08/02/2022 0004    Drugs of Abuse  No results found for: "LABOPIA", "COCAINSCRNUR", "LABBENZ", "AMPHETMU", "THCU", "LABBARB"   RADIOLOGY STUDIES: No results found.    IMPRESSION:   Acute on chronic nausea, vomiting, p.o. intolerance.  Patient has known, longstanding gastroparesis and fairly frequent bouts of self-limited nausea, vomiting.  He is on chronic PPI bid and ? Carafate? (After discussing this with him I am not sure he takes this) and has history of gastric ulcers, gastritis at EGD 1 year ago.  Other contributor to his  nausea/vomiting is persistent distention in loops of his small bowel without overt obstruction.  Suffers from chronic constipation.  Had colonoscopy with removal of TA polyp in August 2022.  Chronic Eliquis and Plavix.  On hold for several days.  Receiving Lovenox injection daily.Marland Kitchen   PLAN:       Per Dr Candis Schatz.  Not clear patient requires EGD at this point but certainly a consideration.   Azucena Freed  08/10/2022, 11:55 AM Phone [phone]  --------------------------------------------------------------------------  I have taken a history, reviewed the chart and examined the patient. I performed a substantive portion of this encounter, including complete performance of at least one of the key components, in conjunction with the APP. I agree with the APP's note, impression and recommendations  71 year old male with complicated history to include CAD, HFpEF, hx of DVT/PE, long standing DM with previous diagnosis of gastroparesis, now bed bound because of severe peripheral neuropathy, admitted with refractory nausea and vomiting with pain radiating to the back.  Initial presentation  was concerning for possible NSTEMI due to elevated troponins, but ACS since been ruled out.   His initial CT showed diffuse dilated loops of small bowel concerning for SBO vs ileus.  General surgery initially consulted but feel that mechanical obstruction unlikely at this point.  He initially had NG tube output, but this has since been removed and he was tolerating liquids.  He has persistent but improved small bowel dilation and has shown progression of contrast into the colon.  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   I had a long discussion with the patient and his wife today about my suspicion for an underlying motility disorder, likely related to neuropathy.   He would benefit from an evaluation from a tertiary facility  with motility specialists. A repeat gastric emptying study should be considered and then consideration for compassionate therapies such as gastric pacemaker or domperidone.  I don't think an EGD would be beneficial, and certainly would not provide any insight into his dilated small bowel.  Ileus/gastroparesis - For now, I would recommend titrating up his Reglan to 10 mg IV TID scheduled. - Continue CLD diet. - Consider repeat GES as inpatient (would need to off all prokinetic medications);  - Outpatient referral to tertiary facility/motility center  GI will continue to follow   [place]. Candis Schatz, MD [facility]

## 2022-08-10 NOTE — Progress Notes (Signed)
   Progress Note     Subjective: Had some emesis yesterday after some of his pudding.  No further emesis.  Still with some nausea.    Objective: Vital signs in last 24 hours: Temp:  [97.5 F (36.4 C)-97.9 F (36.6 C)] 97.7 F (36.5 C) (11/27 0358) Pulse Rate:  [73-95] 77 (11/27 0820) Resp:  [18-20] 20 (11/27 0820) BP: (124-160)/(59-86) 129/64 (11/27 0820) SpO2:  [93 %-95 %] 94 % (11/27 0820) Weight:  [135.9 kg] 135.9 kg (11/27 0358) Last BM Date : 08/09/22  Intake/Output from previous day: 11/26 0701 - 11/27 0700 In: 630 [P.O.:630] Out: 1001 [Urine:1000; Stool:1] Intake/Output this shift: No intake/output data recorded.  PE: Gen:  Alert, NAD, pleasant Abd: obese, soft, no TTP, rebound or guarding   Lab Results:  No results for input(s): "WBC", "HGB", "HCT", "PLT" in the last 72 hours. BMET Recent Labs    08/08/22 0819 08/10/22 0051  NA 137 137  K 3.5 3.4*  CL 105 107  CO2 22 22  GLUCOSE 123* 172*  BUN 9 5*  CREATININE 1.00 1.07  CALCIUM 8.6* 8.6*   PT/INR No results for input(s): "LABPROT", "INR" in the last 72 hours. CMP     Component Value Date/Time   NA 137 08/10/2022 0051   K 3.4 (L) 08/10/2022 0051   CL 107 08/10/2022 0051   CO2 22 08/10/2022 0051   GLUCOSE 172 (H) 08/10/2022 0051   BUN 5 (L) 08/10/2022 0051   CREATININE 1.07 08/10/2022 0051   CALCIUM 8.6 (L) 08/10/2022 0051   PROT 6.8 08/02/2022 0630   ALBUMIN 3.3 (L) 08/02/2022 0630   AST 24 08/02/2022 0630   ALT 23 08/02/2022 0630   ALKPHOS 40 08/02/2022 0630   BILITOT 0.7 08/02/2022 0630   GFRNONAA >60 08/10/2022 0051   Lipase     Component Value Date/Time   LIPASE 24 08/02/2022 0004       Studies/Results: No results found.  Anti-infectives: Anti-infectives (From admission, onward)    None        Assessment/Plan  Nausea, vomiting,  chronic dysmotility issues   - SBO protocol repeated 11/20 - tolerating FLD, but did vomit yesterday.  No further emesis, but not  terribly hungry today.  Has gastroparesis and some chronic dysmotility issues at baseline.  Wife at bedside states when he does this at home he doesn't eat much the following day and then will try to eat again after this. - Cont reglan - pt on reglan at home for gastroparesis - increased to TID - keep K >4.0 and Mg >2.0 to optimize bowel function  - restart home med- senokot nad probiotic - no indication for emergent surgical intervention  - I do not think the patient has a surgical problem related to a bowel obstruction.  I think he has chronic issues with dysmotility given his bedbound state and other medical issues that contribute to this problem.  We recommend a gastroenterology consult for further assistance and management of this problem. -d/w primary service   FEN - FLD, IVFs VTE - heparin gtt off, plavix/eliquis on hold, Lovenox SQ added ID - none currently needed   CAD NSTEMI - heparin x 48 hours CHF HTN HLD DM Bedbound Morbid obesity Chronic bowel dysmotility Peripheral neuropathy OSA   LOS: 8 days     Henreitta Cea, [facility] 08/10/2022, 9:59 AM Please see Amion for pager number during day hours 7:00am-4:30pm

## 2022-08-11 DIAGNOSIS — K3184 Gastroparesis: Secondary | ICD-10-CM | POA: Diagnosis present

## 2022-08-11 DIAGNOSIS — R112 Nausea with vomiting, unspecified: Secondary | ICD-10-CM | POA: Diagnosis not present

## 2022-08-11 DIAGNOSIS — I214 Non-ST elevation (NSTEMI) myocardial infarction: Secondary | ICD-10-CM | POA: Diagnosis not present

## 2022-08-11 DIAGNOSIS — K5989 Other specified functional intestinal disorders: Secondary | ICD-10-CM | POA: Diagnosis not present

## 2022-08-11 LAB — MAGNESIUM: Magnesium: 1.7 mg/dL (ref 1.7–2.4)

## 2022-08-11 LAB — BASIC METABOLIC PANEL
Anion gap: 9 (ref 5–15)
BUN: <5 mg/dL — ABNORMAL LOW (ref 8–23)
CO2: 22 mmol/L (ref 22–32)
Calcium: 8.6 mg/dL — ABNORMAL LOW (ref 8.9–10.3)
Chloride: 109 mmol/L (ref 98–111)
Creatinine, Ser: 1.08 mg/dL (ref 0.61–1.24)
GFR, Estimated: >60 mL/min (ref >60)
Glucose, Bld: 123 mg/dL — ABNORMAL HIGH (ref 70–99)
Potassium: 4 mmol/L (ref 3.5–5.1)
Sodium: 140 mmol/L (ref 135–145)

## 2022-08-11 LAB — GLUCOSE, CAPILLARY
Glucose-Capillary: 121 mg/dL — ABNORMAL HIGH (ref 70–99)
Glucose-Capillary: 112 mg/dL — ABNORMAL HIGH (ref 70–99)
Glucose-Capillary: 133 mg/dL — ABNORMAL HIGH (ref 70–99)
Glucose-Capillary: 158 mg/dL — ABNORMAL HIGH (ref 70–99)
Glucose-Capillary: 131 mg/dL — ABNORMAL HIGH (ref 70–99)

## 2022-08-11 MED ORDER — ENOXAPARIN SODIUM 30 MG/0.3ML IJ SOSY
30.0000 mg | PREFILLED_SYRINGE | Freq: Once | INTRAMUSCULAR | Status: AC
  Administered 2022-08-11: 30 mg via SUBCUTANEOUS
  Filled 2022-08-11: qty 0.3

## 2022-08-11 MED ORDER — ENOXAPARIN SODIUM 80 MG/0.8ML IJ SOSY
70.0000 mg | PREFILLED_SYRINGE | INTRAMUSCULAR | Status: DC
  Administered 2022-08-12: 70 mg via SUBCUTANEOUS
  Filled 2022-08-11: qty 0.8

## 2022-08-11 NOTE — Progress Notes (Signed)
PROGRESS NOTE    Randy Liu  EUM:[id number] DOB: [birth year] DOA: 08/09/2022 PCP: [name], MD   Brief Narrative:  HPI: This is a 71 year old male with past medical history of DM2, DVT/PE on eliquis, HTN, HFrEF, ICM, multiple NSTEMIs over past several months.  11/17 he developed had an episode of nausea and vomiting.  Around 6:30 PM 11/18 he again started throwing up.  He took his Zofran, meclizine and his nighttime meds and everything came back up.  He had emesis all night.  He went to the pain between his shoulder blade, neck and shoulders.  He became diaphoretic.  911 was called.  He never had chest pains.   In the ER CT abdomen pelvis revealed small bowel obstruction.  Lab work revealed an initial troponin of 30, second troponin of 300.  EKG without acute ischemic changes.  Patient remains chest pain-free.   Cardiology contacted by ER physician via phone recommend ACS protocol.  They will see him in the a.m.   History provided by patient and wife who is present bedside.  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  NGT was clamped and eventually removed on 08/05/2022 and he was started on clear liquid diet, he was doing fine until morning of 08/06/2022 when he had large amount of bilious  vomiting and started complaining of abdominal pain and not passing flatus, repeat abdominal x-ray was done which was unremarkable.  Patient received suppository,  had a bowel movement as well but then after eating pudding the night before, he had vomiting.  He continued to have some intermittent nausea and per general surgery, his issue is mostly dysmotility and gastroparesis instead of SBO so they recommended GI consultation, GI had seen him, they have increased his Reglan, they are planning to do possible motility studies but no EGD at the moment.  Appreciate GI and general surgery help.  Currently on full liquid diet, defer to GI for advancing the diet.  CAD with previous history of PCI/recovered ischemic cardiomyopathy/chronic heart failure with preserved ejection fraction /NSTEMI vs [place] ischemia: No chest pain or shortness of breath.  Cardiology consulted, patient is asymptomatic and is on heparin which I will discontinue today since it has been 48 hours, as cardiology recommended.  Cardiac enzymes however went up to 700 yesterday.  Cardiology had reviewed him and basically mentioned that patient is not candidate for any intervention and signed off.  Echo ruled out wall motion abnormality.  History of GI ulcers s/p duodenal polyp clipping: Continue IV Protonix.  Hemoglobin stable.  Essential hypertension: Controlled.  Continue current medications.  Type 2 diabetes mellitus: Blood sugar controlled.  Continue Semglee 25 units and SSI.  Dyslipidemia/statin intolerance: Fenofibrate on hold.  Stage IIIa CKD: Stable at baseline.  Sleep apnea on CPAP at night at  home.  Hypokalemia/hypomagnesemia: Labs pending this morning.  DVT prophylaxis: enoxaparin (LOVENOX) injection 40 mg Start: 08/08/22 1115 SCDs Start: 08/02/22 0454   Code Status: Full Code  Family Communication: Wife present at bedside.  Plan of care discussed with patient in length and he/she verbalized understanding and agreed with  it.  Status is: Inpatient Remains inpatient appropriate because: Still with SBO/gastric dysmotility/symptomatic.   Estimated body mass index is 39.21 kg/m as calculated from the following:   Height as of this encounter: 6' 1"  (1.854 m).   Weight as of this encounter: 134.8 kg.    Nutritional Assessment: Body mass index is 39.21 kg/m.Marland Kitchen Seen by dietician.  I agree with the assessment and plan as outlined below: Nutrition Status:        . Skin Assessment: I have examined the patient's skin and I agree with the wound assessment as performed by the wound care RN as outlined below:    Consultants:  Cardiology General surgery  Procedures:  As above  Antimicrobials:  Anti-infectives (From admission, onward)    None         Subjective:  Seen and examined.  No nausea but states that he is not passing flatus again and has not had any bowel movement last 24 hours.  No abdominal pain or any other complaint.  Objective: Vitals:   08/10/22 1936 08/10/22 2312 08/10/22 2319 08/11/22 0420  BP: 137/89 127/69 (!) 132/59 (!) 137/58  Pulse: 84  89 81  Resp: 16  20 18   Temp: (!) 97.5 F (36.4 C)   98 F (36.7 C)  TempSrc: Oral     SpO2: 97%  96% 95%  Weight:    134.8 kg  Height:        Intake/Output Summary (Last 24 hours) at 08/11/2022 1025 Last data filed at 08/11/2022 0500 Gross per 24 hour  Intake 240 ml  Output 1100 ml  Net -860 ml    Filed Weights   08/09/22 0413 08/10/22 0358 08/11/22 0420  Weight: (!) 137.8 kg 135.9 kg 134.8 kg    Examination:  General exam: Appears calm and comfortable, obese Respiratory system: Clear to auscultation. Respiratory effort normal. Cardiovascular system: S1 & S2 heard, RRR. No JVD, murmurs, rubs, gallops or clicks. No pedal edema. Gastrointestinal system: Abdomen is nondistended, soft and nontender. No organomegaly or masses felt. Normal bowel sounds heard. Central nervous system: Alert and oriented. No focal neurological  deficits.  However has chronic lower extremity weakness for which he is bedbound. Extremities: Symmetric 5 x 5 power. Skin: No rashes, lesions or ulcers.  Psychiatry: Judgement and insight appear normal. Mood & affect appropriate.   Data Reviewed: I have personally reviewed following labs and imaging studies  CBC: Recent Labs  Lab 08/07/22 0040  WBC 5.5  NEUTROABS 3.4  HGB 11.3*  HCT 36.6*  MCV 94.6  PLT 779    Basic Metabolic Panel: Recent Labs  Lab 08/05/22 0111 08/07/22 0040 08/08/22 0819 08/09/22 0103 08/10/22 0051  NA 145 141 137  --  137  K 4.0 3.4* 3.5  --  3.4*  CL 105 108 105  --  107  CO2 23 22 22   --  22  GLUCOSE 129* 109* 123*  --  172*  BUN 28* 14 9  --  5*  CREATININE 1.48* 1.19 1.00  --  1.07  CALCIUM 9.3 8.8* 8.6*  --  8.6*  MG  --  1.6* 1.5* 1.7 1.4*    GFR: Estimated Creatinine Clearance: 88.6  mL/min (by C-G formula based on SCr of 1.07 mg/dL). Liver Function Tests: No results for input(s): "AST", "ALT", "ALKPHOS", "BILITOT", "PROT", "ALBUMIN" in the last 168 hours.  No results for input(s): "LIPASE", "AMYLASE" in the last 168 hours.  No results for input(s): "AMMONIA" in the last 168 hours. Coagulation Profile: No results for input(s): "INR", "PROTIME" in the last 168 hours. Cardiac Enzymes: No results for input(s): "CKTOTAL", "CKMB", "CKMBINDEX", "TROPONINI" in the last 168 hours. BNP (last 3 results) No results for input(s): "PROBNP" in the last 8760 hours. HbA1C: No results for input(s): "HGBA1C" in the last 72 hours. CBG: Recent Labs  Lab 08/10/22 1549 08/10/22 2029 08/10/22 2316 08/11/22 0418 08/11/22 0744  GLUCAP 124* 116* 109* 121* 112*    Lipid Profile: No results for input(s): "CHOL", "HDL", "LDLCALC", "TRIG", "CHOLHDL", "LDLDIRECT" in the last 72 hours.  Thyroid Function Tests: No results for input(s): "TSH", "T4TOTAL", "FREET4", "T3FREE", "THYROIDAB" in the last 72 hours. Anemia Panel: No results for input(s):  "VITAMINB12", "FOLATE", "FERRITIN", "TIBC", "IRON", "RETICCTPCT" in the last 72 hours. Sepsis Labs: No results for input(s): "PROCALCITON", "LATICACIDVEN" in the last 168 hours.   No results found for this or any previous visit (from the past 240 hour(s)).   Radiology Studies: No results found.  Scheduled Meds:  enoxaparin (LOVENOX) injection  40 mg Subcutaneous Q24H   insulin aspart  0-9 Units Subcutaneous Q4H   insulin glargine-yfgn  25 Units Subcutaneous Daily   latanoprost  1 drop Left Eye QHS   metoCLOPramide (REGLAN) injection  10 mg Intravenous Q8H   metoprolol tartrate  5 mg Intravenous Q8H   pantoprazole  40 mg Oral BID   saccharomyces boulardii  250 mg Oral BID   senna  1 tablet Oral Daily   Continuous Infusions:  sodium chloride 75 mL/hr at 08/11/22 0746     LOS: 9 days   Darliss Cheney, MD [facility]  08/11/2022, 10:25 AM   *Please note that this is a verbal dictation therefore any spelling or grammatical errors are due to the "[place] [employer]" system interpretation.  Please page via [place] and do not message via secure chat for urgent patient care matters. Secure chat can be used for non urgent patient care matters.  How to contact the [facility] Attending or Consulting provider [place] or covering provider during after hours [place], for this patient?  Check the care team in [facility] and look for a) attending/consulting TR[facility] provider listed and b) the [facility] team listed. Page or secure chat [phone]. Log into [email or web address] and use [place]'s universal password to access. If you do not have the password, please contact the hospital operator. Locate the [facility] provider you are looking for under [facility] and page to a number that you can be directly reached. If you still have difficulty reaching the provider, please page the [facility] (Director on Call) for the Hospitalists listed on amion for assistance.

## 2022-08-11 NOTE — Progress Notes (Signed)
pt stated having nausea and has been having small amounts of emesis. but it appears foamy and white.  per pts wife , began having this symptoms after eating pudding. Zofran iv given.    Pt stated he was having chest pain and pointed to mid upper section of the abdomen,  pt stated pain level of 6/10 pain scale. EKG was completed . Dr. Doristine Bosworth and Dr.Cunningham aware.   Scheduled Reglan given. Pt stated feeling better. But somewhat nauseated. Younker within reach. Call bell within reach. Wife at bedside.

## 2022-08-11 NOTE — Progress Notes (Signed)
MEDICATION RELATED CONSULT NOTE - INITIAL   Pharmacy may adjust Lovenox Indication: VTE prophylaxis   Patient Measurements: Height: 6' 1"  (185.4 cm) Weight: 134.8 kg (297 lb 2.9 oz) IBW/kg (Calculated) : 79.9 BMI = 39.2  Vital Signs: Temp: 98 F (36.7 C) (11/28 0420) BP: 137/58 (11/28 0420) Pulse Rate: 81 (11/28 0420) Intake/Output from previous day: 11/27 0701 - 11/28 0700 In: 290 [P.O.:290] Out: 1100 [Urine:1100] Intake/Output from this shift: No intake/output data recorded.  Labs: Recent Labs    08/09/22 0103 08/10/22 0051 08/11/22 0843  CREATININE  --  1.07 1.08  MG 1.7 1.4* 1.7   Estimated Creatinine Clearance: 87.8 mL/min (by C-G formula based on SCr of 1.08 mg/dL).   Microbiology: No results found for this or any previous visit (from the past 720 hour(s)).  Medical History: Past Medical History:  Diagnosis Date   Anal fissure    Asthma    Cardiomyopathy ([place])    CHF (congestive heart failure) ([place])    Coronary artery disease    Diabetes mellitus without complication ([place])    History of DVT (deep vein thrombosis)    History of ulcer disease    Hyperlipidemia    Hypertension    Peripheral neuropathy    Sleep apnea      Assessment: Currently receiving Lovenox 59m sq every 24hr for VTE prophylaxis.  Ordered as pharmacy may adjust.   His weight is 134.8 kg,  BMI = 39.2 Hgb 13.5>11.3, pltc  207k stable/wnl. No bleeding reported.  I discussed this with Dr. PDoristine Bosworth who approved okay to increase lovenox dose.  Goal of Therapy:  VTE prevention  Plan:  Give lovenox 30 mg sq x1 now to = total 70 mg today (0.545mkg) including 40 mg dose already given this morning,  Then change lovenox dose to 70 mg sq Q24hr for VTE ppx.  CBC in AM and 12December 27, 2023Pharmacy will sign off.  Will follow peripherally.   Thank you for allowing pharmacy to be part of this patients care team.  RuNicole CellaRPAkhiokharmacist  08/11/2022,11:37 AM

## 2022-08-11 NOTE — Progress Notes (Signed)
[facility] ROUNDING NOTE   Subjective: Patient had emesis last night and today after eating pudding.  He has been able to tolerate clear liquids without any nausea/vomiting.  He had a bowel movement this afternoon and reports feeling better. Wife was noticing some involuntary shaking movements of his right arm earlier which were transient.   Objective: Vital signs in last 24 hours: Temp:  [97.5 F (36.4 C)-98 F (36.7 C)] 97.5 F (36.4 C) (11/28 2000) Pulse Rate:  [81-99] 88 (11/28 2000) Resp:  [18-23] 23 (11/28 2000) BP: (127-165)/(58-90) 128/60 (11/28 2000) SpO2:  [93 %-96 %] 95 % (11/28 2000) Weight:  [134.8 kg] 134.8 kg (11/28 0420) Last BM Date : 08/09/22 General: Chronically ill appearing Caucasian male, NAD, hard of hearing Lungs:  CTA b/l, no w/r/r Heart:  RRR, no m/r/g Abdomen:  Soft, NT, ND, bowel sounds normoactive, not high pitched or tinkling.  Intake/Output from previous day: 11/27 0701 - 11/28 0700 In: 290 [P.O.:290] Out: 1100 [Urine:1100] Intake/Output this shift: No intake/output data recorded.   Lab Results: No results for input(s): "WBC", "HGB", "PLT", "MCV" in the last 72 hours. BMET Recent Labs    08/10/22 0051 08/11/22 0843  NA 137 140  K 3.4* 4.0  CL 107 109  CO2 22 22  GLUCOSE 172* 123*  BUN 5* <5*  CREATININE 1.07 1.08  CALCIUM 8.6* 8.6*   LFT No results for input(s): "PROT", "ALBUMIN", "AST", "ALT", "ALKPHOS", "BILITOT", "BILIDIR", "IBILI" in the last 72 hours. PT/INR No results for input(s): "INR" in the last 72 hours.    Imaging/Other results: No results found.    Assessment and Plan:  71 year old male with complicated history to include CAD, HFpEF, hx of DVT/PE, long standing DM with previous diagnosis of gastroparesis, now bed bound because of severe peripheral neuropathy, admitted with refractory nausea and vomiting with pain radiating to the back.  Initial presentation was concerning for possible NSTEMI due  to elevated troponins, but ACS since been ruled out.   His initial CT showed diffuse dilated loops of small bowel concerning for SBO vs ileus.  General surgery initially consulted but feel that mechanical obstruction unlikely at this point.  He initially had NG tube output, but this has since been removed and he was tolerating liquids.   He has persistent but improved small bowel dilation and has shown progression of contrast into the colon.   Overall, his symptoms and imaging are suggestive of an underlying dysmotility, likely involving the stomach and small bowel, likely related to neuropathy.   He would benefit from an evaluation from a tertiary facility with motility specialists. A repeat gastric emptying study should be considered and then consideration for compassionate therapies such as gastric pacemaker or domperidone.   He is tolerating a CLD well, but still has had problems with pudding alone.  I think pursuing other dietary options such as yogurt, apple sauce or other full liquids would be a good next step.   Ileus/gastroparesis - Continue Reglan 10 mg IV TID - Continue full liquid diet; avoid pudding as, this has been a trigger for vomiting;patient to try other full liquid options this evening - Consider repeat GES as inpatient (would need to off all prokinetic medications);  - Outpatient referral to tertiary facility/motility center once discharged    Randy November, MD  08/11/2022, 8:49 PM [facility]

## 2022-08-11 NOTE — Plan of Care (Signed)
  Problem: Activity: Goal: Ability to tolerate increased activity will improve Outcome: Progressing   Problem: Cardiac: Goal: Ability to achieve and maintain adequate cardiovascular perfusion will improve Outcome: Progressing   Problem: Health Behavior/Discharge Planning: Goal: Ability to safely manage health-related needs after discharge will improve Outcome: Progressing   Problem: Education: Goal: Knowledge of General Education information will improve Description: Including pain rating scale, medication(s)/side effects and non-pharmacologic comfort measures Outcome: Progressing

## 2022-08-11 NOTE — TOC Progression Note (Signed)
Transition of Care [facility]) - Progression Note    Patient Details  Name: Saliou Barnier MRN: [id number] Date of Birth: 1951-01-10  Transition of [facility]) CM/SW Contact  Zenon Mayo, RN Phone Number: 08/11/2022, 6:25 PM  Clinical Narrative:    conts on full liq diet, had emesis yesterday, bed bound, ivf at 75, una boots. TOC following.        Expected Discharge Plan and Services                                                 Social Determinants of Health (SDOH) Interventions    Readmission Risk Interventions    04/19/2022   10:21 AM 11/25/2021    3:43 PM 09/02/2021    3:35 PM  Readmission Risk Prevention Plan  Transportation Screening Complete Complete Complete  PCP or Specialist Appt within 3-5 Days Complete    HRI or [place] Complete    Social Work Consult for [place] Planning/Counseling Complete    Palliative Care Screening Not Applicable    Medication Review Press photographer) Complete  Complete  HRI or Home Care Consult  Complete Complete  SW Recovery Care/Counseling Consult  Complete Complete  Palliative Care Screening   Not [place]  Complete Not Applicable

## 2022-08-11 NOTE — Progress Notes (Signed)
Orthopedic Tech Progress Note Patient Details:  Randy Liu 1951-01-08 [id number]  Patient did have a small red spot on the lateral side of left ankle   Ortho Devices Type of Ortho Device: Ace wrap, Unna boot Ortho Device/Splint Location: BLE Ortho Device/Splint Interventions: Ordered, Application, Adjustment   Post Interventions Patient Tolerated: Well Instructions Provided: Care of device  Janit Pagan 08/11/2022, 7:08 PM

## 2022-08-11 NOTE — Progress Notes (Signed)
Greatly appreciate GI evaluation and recommendations yesterday.  Agree with their assessment.  Given patient does not have evidence of an obstruction we will defer further management of GI dysmotility to GI here and further work up as an outpatient.  No plans for surgical intervention.  We are available as needed.  Henreitta Cea 8:13 AM 08/11/2022

## 2022-08-11 NOTE — Progress Notes (Signed)
Patient is now on full liquid diet and tolerating that well, takes all PO tablets without any problems. Patient has NaCl 0.9% infusing at 71m/hr, has history of CHF. Can we reevaluate fluids infusing?

## 2022-08-12 ENCOUNTER — Inpatient Hospital Stay (HOSPITAL_COMMUNITY): Payer: Medicare Other

## 2022-08-12 DIAGNOSIS — K5989 Other specified functional intestinal disorders: Secondary | ICD-10-CM | POA: Diagnosis not present

## 2022-08-12 DIAGNOSIS — R112 Nausea with vomiting, unspecified: Secondary | ICD-10-CM | POA: Diagnosis not present

## 2022-08-12 DIAGNOSIS — I214 Non-ST elevation (NSTEMI) myocardial infarction: Secondary | ICD-10-CM | POA: Diagnosis not present

## 2022-08-12 LAB — CBC
HCT: 35.4 % — ABNORMAL LOW (ref 39.0–52.0)
Hemoglobin: 11.4 g/dL — ABNORMAL LOW (ref 13.0–17.0)
MCH: 29.9 pg (ref 26.0–34.0)
MCHC: 32.2 g/dL (ref 30.0–36.0)
MCV: 92.9 fL (ref 80.0–100.0)
Platelets: 238 10*3/uL (ref 150–400)
RBC: 3.81 MIL/uL — ABNORMAL LOW (ref 4.22–5.81)
RDW: 15.7 % — ABNORMAL HIGH (ref 11.5–15.5)
WBC: 6.4 10*3/uL (ref 4.0–10.5)
nRBC: 0 % (ref 0.0–0.2)

## 2022-08-12 LAB — GLUCOSE, CAPILLARY
Glucose-Capillary: 103 mg/dL — ABNORMAL HIGH (ref 70–99)
Glucose-Capillary: 118 mg/dL — ABNORMAL HIGH (ref 70–99)
Glucose-Capillary: 131 mg/dL — ABNORMAL HIGH (ref 70–99)
Glucose-Capillary: 133 mg/dL — ABNORMAL HIGH (ref 70–99)
Glucose-Capillary: 144 mg/dL — ABNORMAL HIGH (ref 70–99)
Glucose-Capillary: 185 mg/dL — ABNORMAL HIGH (ref 70–99)

## 2022-08-12 MED ORDER — MORPHINE SULFATE (PF) 2 MG/ML IV SOLN
INTRAVENOUS | Status: AC
  Filled 2022-08-12: qty 1

## 2022-08-12 MED ORDER — MORPHINE SULFATE (PF) 2 MG/ML IV SOLN
1.0000 mg | Freq: Once | INTRAVENOUS | Status: AC
  Administered 2022-08-12: 1 mg via INTRAVENOUS

## 2022-08-12 MED ORDER — ADULT MULTIVITAMIN W/MINERALS CH
1.0000 | ORAL_TABLET | Freq: Every day | ORAL | Status: DC
  Administered 2022-08-12 – 2022-08-13 (×2): 1 via ORAL
  Filled 2022-08-12 (×2): qty 1

## 2022-08-12 MED ORDER — TRAZODONE HCL 50 MG PO TABS
25.0000 mg | ORAL_TABLET | Freq: Every evening | ORAL | Status: DC | PRN
  Administered 2022-08-12 – 2022-08-14 (×3): 25 mg via ORAL
  Filled 2022-08-12 (×3): qty 1

## 2022-08-12 MED ORDER — ENOXAPARIN SODIUM 150 MG/ML IJ SOSY
140.0000 mg | PREFILLED_SYRINGE | Freq: Two times a day (BID) | INTRAMUSCULAR | Status: DC
  Administered 2022-08-12 – 2022-08-14 (×4): 140 mg via SUBCUTANEOUS
  Filled 2022-08-12 (×4): qty 0.94

## 2022-08-12 MED ORDER — METOCLOPRAMIDE HCL 5 MG/ML IJ SOLN
10.0000 mg | Freq: Three times a day (TID) | INTRAMUSCULAR | Status: DC
  Administered 2022-08-12 – 2022-08-14 (×6): 10 mg via INTRAVENOUS
  Filled 2022-08-12 (×6): qty 2

## 2022-08-12 MED ORDER — FUROSEMIDE 10 MG/ML IJ SOLN
40.0000 mg | Freq: Two times a day (BID) | INTRAMUSCULAR | Status: DC
  Administered 2022-08-12 – 2022-08-14 (×5): 40 mg via INTRAVENOUS
  Filled 2022-08-12 (×5): qty 4

## 2022-08-12 MED ORDER — MAGNESIUM SULFATE 2 GM/50ML IV SOLN
2.0000 g | Freq: Once | INTRAVENOUS | Status: AC
  Administered 2022-08-12: 2 g via INTRAVENOUS
  Filled 2022-08-12: qty 50

## 2022-08-12 MED ORDER — BOOST / RESOURCE BREEZE PO LIQD CUSTOM
1.0000 | Freq: Three times a day (TID) | ORAL | Status: DC
  Administered 2022-08-12: 1 via ORAL

## 2022-08-12 NOTE — Progress Notes (Signed)
CCMD notified RN that patient had 4 runs of [place]. RN rounded on patient. Dietician and wife at bedside. Patient shows no signs of distress lying in bed. Patient verbalize RN that patient was coughing a few minutes prior. Plan of care continues.

## 2022-08-12 NOTE — Progress Notes (Addendum)
Patient complained of being [facility] again this morning, provider paged and orders received for stat xray and lasix.

## 2022-08-12 NOTE — Significant Event (Signed)
Rapid Response Event Note   Reason for Call :  Vomiting bile and constant cough  Initial Focused Assessment:  Patient is alert and oriented.  He is coughing almost constantly.   Lung sounds faint crackles in bases Mild upper airway expiratory wheeze Heart tones regular Abdomen distended, hypoactive bowel sounds Bladder scan 62 cc, voiding via male purwick  He vomited about 50-100cc bile Thin clear secretions  BP 198/167  HR 115-140  RR 20-28  O2 sat 96% on 2L [place]  Interventions:  31m Morphine IV Portable KUB NG tube placed, about 50cc bile output.   Xray for NG tube placement   BP 154/81  HR 123  RR 18  O2 sat 98% on 2L [place]  Plan of Care:  Rn to call if assistance needed.   Event Summary:   MD Notified: JBroadus JohnCall Time: [id number] Time: 1708 End Time: 1815  LRaliegh Ip RN

## 2022-08-12 NOTE — Significant Event (Signed)
Rapid Response Event Note   Reason for Call :  Yellow mews, "tremor"  Initial Focused Assessment:  Patient is alert and oriented. He is coughing frequently.  Clear thin secretions.  Lung sounds decreased bases.  Heart tones regular. He complains of a head ache. 2+ edema: generalized  "Tremor" stops when arm is touched or repositioned.   Interventions:  Tylenol for HA Lopressor for HR Has already received 40 mg Lasix Encouraged "Chair Position" in bed.   Plan of Care:  Yellow MEWS: VS q 2 hour x1, then q 4 hours until remains "green" for 12 hours. RN to call if patient has respiratory distress or increased O2 needs.    Event Summary:   MD Notified: Broadus John Call Time: [place] Time: 1210 End Time: [place]  Raliegh Ip, RN

## 2022-08-12 NOTE — Progress Notes (Addendum)
   08/12/22 1655  Vitals  Temp 98.1 F (36.7 C)  Temp Source Oral  BP (!) 198/167  MAP (mmHg) 174  BP Location Left Arm  BP Method Automatic  Patient Position (if appropriate) Lying  Pulse Rate (!) 115  Pulse Rate Source Monitor  ECG Heart Rate (!) 117  Resp (!) 28  MEWS COLOR  MEWS Score Color Red  Oxygen Therapy  SpO2 96 %  O2 Device Nasal Cannula  O2 Flow Rate (L/min) 2 L/min  MEWS Score  MEWS Temp 0  MEWS Systolic 0  MEWS Pulse 2  MEWS RR 2  MEWS LOC 0  MEWS Score 4   Patient had two episodes of emesis with bile. Recent episode was projectile emesis. Patient tachypnea, tachycardiac, elevated SBP in relation to persistent coughing. RN gave prn cough syrup and scheduled reglan. MD paged and notified. Rapid Designer, multimedia paged and notified.    1730 Patient had another round of emesis with bile. Verbal order to place NG tube from [facility] MD. STAT Abdomen scan ordered for placement.

## 2022-08-12 NOTE — Progress Notes (Addendum)
PROGRESS NOTE    Randy Liu  VOJ:[id number] DOB: 17-Sep-1950 DOA: 08/13/2022 PCP: [name], MD  Brief Narrative:  HPI:71/M w DM2, DVT/PE on eliquis, HTN, DVT/PE, HFrEF, ICM, CAD/multiple NSTEMIs over past several months, significantly debilitated, has not walked in 3 years, essentially bedbound for close to 2 years now.   -11/17 he developed persistent nausea and vomiting. In the ER CT Abd w/ concern for SBO,  Lab troponin of 30, second troponin of 300.  EKG without acute ischemic changes. Seen by cards, conservative management -For SBO initially treated with NG decompression, followed by surgery, clinically improved and then worsened again with nausea and vomiting without recurrence of SBO at this time, now felt to have gastroparesis/dysmotility LAD contributing to recurrent vomiting, gastroenterology following, on high-dose Reglan -Overnight 11/28 with cough and shortness of breath -11/29, started on IV Lasix, fluids discontinued, palliative care consulted  Assessment & Plan:   Recurrent nausea and vomiting -Initially treated for SBO, CT with dilated small bowel loops, with NG decompression, bowel rest, which was subsequently advanced, having BMs -Then had recurrence of nausea and vomiting this time without obstruction or ileus, surgery recommended GI eval for gastroparesis/dysmotility rather than SBO -Gastroenterology following, Reglan dose increased, currently on liquids -Has been on IV fluids for 10 days, will discontinue this today, is significantly volume overloaded with third spacing  Acute on chronic chronic systolic CHF  CAD with prior PCI,  Demand ischemia:  -No chest pain or shortness of breath.  Cardiology consulted, patient is asymptomatic and was on heparin , stopped after 48 hours,   -Last echo 8/23 with EF 99%, grade 1 diastolic dysfunction -Seen by cards 11/19, per cards notes has severe diffuse CAD not a candidate for CABG, no further workup recommended at  this time -Now he is significantly volume overloaded after being on IV fluids for 10 days, discontinue IV fluids today, start IV Lasix 40 Mg twice daily, check albumin level in a.m. -metoprolol-continue this IV  GOALS: Today is my first time seeing this patient and wife, I feel he is significantly debilitated, completely bedbound for close to 2 years and has not walked in over 3 years, with multiple medical problems, severe three-vessel CAD, CHF and ongoing failure to thrive, third spacing -Discussed palliative care with patient's wife today, reevaluating CODE STATUS, she is open to palliative care meeting  History of GI ulcers s/p duodenal polyp clipping: - Continue IV Protonix.  Hemoglobin stable.  H/o DVT/PE -On Eliquis at baseline, currently on DVT prophylaxis dose of Lovenox, will increase to full dose Lovenox until GI symptoms improve  Essential hypertension:  Continue current medications.  Type 2 diabetes mellitus: Blood sugar controlled.  Continue Semglee 25 units and SSI.  Dyslipidemia/statin intolerance: Fenofibrate on hold.  Stage IIIa CKD: Stable at baseline.  Sleep apnea on CPAP at night at home.  Hypokalemia/hypomagnesemia: Repleted  DVT prophylaxis:  lovenox-increase to full dose   Code Status: Full Code  Family Communication: Wife at bedside Dispo: Home when improved     Consultants:  Cardiology General surgery  Procedures:  As above  Antimicrobials:  Anti-infectives (From admission, onward)    None         Subjective:  Overnight with cough and shortness of breath  Objective: Vitals:   08/11/22 1402 08/11/22 2000 08/12/22 0403 08/12/22 0535  BP: (!) 163/74 128/60 (!) 123/54 (!) 118/56  Pulse: 85 88 84   Resp:  (!) 23 20 (!) 24  Temp:  (!) 97.5 F (36.4 C)  98.8 F (37.1 C)   TempSrc:  Oral Oral   SpO2: 93% 95% 98%   Weight:   (!) 136.2 kg   Height:        Intake/Output Summary (Last 24 hours) at 08/12/2022 0547 Last data filed at  08/12/2022 0407 Gross per 24 hour  Intake 5324.36 ml  Output 600 ml  Net 4724.36 ml   Filed Weights   08/10/22 0358 08/11/22 0420 08/12/22 0403  Weight: 135.9 kg 134.8 kg (!) 136.2 kg    Examination:  General exam: Obese chronically ill male appears older than stated age,'s awake alert oriented to self and place, mild cognitive deficits CVS: S1-S2, regular rhythm Lungs: Decreased breath sounds at the bases, few Rales Abdomen: Soft, obese, nontender, abdominal wall edema noted Extremities: 3+ edema extending to upper thighs Skin: No rashes, lesions or ulcers.  Psychiatry: Judgement and insight appear normal. Mood & affect appropriate.   Data Reviewed: I have personally reviewed following labs and imaging studies  CBC: Recent Labs  Lab 08/07/22 0040  WBC 5.5  NEUTROABS 3.4  HGB 11.3*  HCT 36.6*  MCV 94.6  PLT 485   Basic Metabolic Panel: Recent Labs  Lab 08/07/22 0040 08/08/22 0819 08/09/22 0103 08/10/22 0051 08/11/22 0843  NA 141 137  --  137 140  K 3.4* 3.5  --  3.4* 4.0  CL 108 105  --  107 109  CO2 22 22  --  22 22  GLUCOSE 109* 123*  --  172* 123*  BUN 14 9  --  5* <5*  CREATININE 1.19 1.00  --  1.07 1.08  CALCIUM 8.8* 8.6*  --  8.6* 8.6*  MG 1.6* 1.5* 1.7 1.4* 1.7   GFR: Estimated Creatinine Clearance: 88.2 mL/min (by C-G formula based on SCr of 1.08 mg/dL). Liver Function Tests: No results for input(s): "AST", "ALT", "ALKPHOS", "BILITOT", "PROT", "ALBUMIN" in the last 168 hours.  No results for input(s): "LIPASE", "AMYLASE" in the last 168 hours.  No results for input(s): "AMMONIA" in the last 168 hours. Coagulation Profile: No results for input(s): "INR", "PROTIME" in the last 168 hours. Cardiac Enzymes: No results for input(s): "CKTOTAL", "CKMB", "CKMBINDEX", "TROPONINI" in the last 168 hours. BNP (last 3 results) No results for input(s): "PROBNP" in the last 8760 hours. HbA1C: No results for input(s): "HGBA1C" in the last 72  hours. CBG: Recent Labs  Lab 08/11/22 1112 08/11/22 1642 08/11/22 2000 08/11/22 2359 08/12/22 0400  GLUCAP 133* 158* 131* 103* 118*   Lipid Profile: No results for input(s): "CHOL", "HDL", "LDLCALC", "TRIG", "CHOLHDL", "LDLDIRECT" in the last 72 hours.  Thyroid Function Tests: No results for input(s): "TSH", "T4TOTAL", "FREET4", "T3FREE", "THYROIDAB" in the last 72 hours. Anemia Panel: No results for input(s): "VITAMINB12", "FOLATE", "FERRITIN", "TIBC", "IRON", "RETICCTPCT" in the last 72 hours. Sepsis Labs: No results for input(s): "PROCALCITON", "LATICACIDVEN" in the last 168 hours.   No results found for this or any previous visit (from the past 240 hour(s)).   Radiology Studies: No results found.  Scheduled Meds:  enoxaparin (LOVENOX) injection  70 mg Subcutaneous Q24H   insulin aspart  0-9 Units Subcutaneous Q4H   insulin glargine-yfgn  25 Units Subcutaneous Daily   latanoprost  1 drop Left Eye QHS   metoCLOPramide (REGLAN) injection  10 mg Intravenous Q8H   metoprolol tartrate  5 mg Intravenous Q8H   pantoprazole  40 mg Oral BID   saccharomyces boulardii  250 mg Oral BID   senna  1 tablet Oral  Daily   Continuous Infusions:  sodium chloride 75 mL/hr at 08/12/22 0324     LOS: 10 days   Domenic Polite, MD [facility]  08/12/2022, 5:47 AM

## 2022-08-12 NOTE — Progress Notes (Signed)
Patient requesting something to help him sleep. Provider notified.

## 2022-08-12 NOTE — Progress Notes (Addendum)
Daily Rounding Note  08/12/2022, 1:06 PM  LOS: 10 days   SUBJECTIVE:   Chief complaint:     Headache this afternoon.  Coughing productive of clear, thin secretions. Rapid response team was called to evaluate tremor in the upper extremities.  This ceased when Rapid Response RN touched and repositioned the limb. Also  has new cough and resp distress, attending attributing to volume overload.   Passed small amount soft, liquid brown stool  w flatus yesterday.  No stool or significant flatus today.    OBJECTIVE:         Vital signs in last 24 hours:    Temp:  [97.5 F (36.4 C)-98.8 F (37.1 C)] 97.5 F (36.4 C) (11/29 1219) Pulse Rate:  [82-108] 95 (11/29 1300) Resp:  [18-24] 23 (11/29 1219) BP: (118-165)/(54-93) 139/67 (11/29 1219) SpO2:  [93 %-98 %] 94 % (11/29 1300) Weight:  [136.2 kg] 136.2 kg (11/29 0403) Last BM Date : 08/11/22 Filed Weights   08/10/22 0358 08/11/22 0420 08/12/22 0403  Weight: 135.9 kg 134.8 kg (!) 136.2 kg   General:.  Pale.  Looks chronically unwell.  Frequent paroxysmal coughing Heart: RRR.  NSR with rate in the high 90s on telemetry monitor Chest: Active paroxysmal coughing. Abdomen: Obese, soft, moderate distention.  Not tender.  Bowel sounds active. Extremities: Lower extremity edema with Ace wraps on lower legs. Neuro/Psych: Alert.  Oriented.  Cooperative.  Difficulty speaking because of his cough.  Initially there was no tremor in his arms.  Briefly, during the encounter, he developed a side-to-side involuntary movement in his right arm.  Before that, performing finger-to nose to palm of my hand repetitive movements, there was no tremor  Intake/Output from previous day: 11/28 0701 - 11/29 0700 In: 5571.6 [P.O.:240; I.V.:5331.6] Out: 1350 [Urine:1350]  Intake/Output this shift: Total I/O In: 10 [P.O.:10] Out: 1150 [Urine:1150]  Lab Results: Recent Labs    08/12/22 0617  WBC 6.4   HGB 11.4*  HCT 35.4*  PLT 238   BMET Recent Labs    08/10/22 0051 08/11/22 0843  NA 137 140  K 3.4* 4.0  CL 107 109  CO2 22 22  GLUCOSE 172* 123*  BUN 5* <5*  CREATININE 1.07 1.08  CALCIUM 8.6* 8.6*   LFT No results for input(s): "PROT", "ALBUMIN", "AST", "ALT", "ALKPHOS", "BILITOT", "BILIDIR", "IBILI" in the last 72 hours. PT/INR No results for input(s): "LABPROT", "INR" in the last 72 hours. Hepatitis Panel No results for input(s): "HEPBSAG", "HCVAB", "HEPAIGM", "HEPBIGM" in the last 72 hours.  Studies/Results: DG CHEST PORT 1 VIEW  Result Date: 08/12/2022 CLINICAL DATA:  Hypoxia and cough. EXAM: PORTABLE CHEST 1 VIEW COMPARISON:  07/25/2022 FINDINGS: The cardiac silhouette, mediastinal and hilar contours are prominent but unchanged. Very low lung volumes with vascular crowding and atelectasis. No definite infiltrates or effusions. IMPRESSION: Very low lung volumes with vascular crowding and atelectasis. Electronically Signed   By: Marijo Sanes M.D.   On: 08/12/2022 08:53    Scheduled Meds:  enoxaparin (LOVENOX) injection  140 mg Subcutaneous Q12H   feeding supplement  1 Container Oral TID BM   furosemide  40 mg Intravenous BID   insulin aspart  0-9 Units Subcutaneous Q4H   insulin glargine-yfgn  25 Units Subcutaneous Daily   latanoprost  1 drop Left Eye QHS   metoCLOPramide (REGLAN) injection  10 mg Intravenous TID AC   metoprolol tartrate  5 mg Intravenous Q8H   multivitamin with minerals  1 tablet Oral Daily   pantoprazole  40 mg Oral BID   saccharomyces boulardii  250 mg Oral BID   senna  1 tablet Oral Daily   Continuous Infusions:  magnesium sulfate bolus IVPB 2 g (08/12/22 1221)   PRN Meds:.acetaminophen, alum & mag hydroxide-simeth, guaiFENesin-dextromethorphan, nitroGLYCERIN, ondansetron (ZOFRAN) IV, phenol, traZODone   ASSESMENT:   Ileus and gastroparesis.  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   Receiving Reglan 10 mg IV TID, up from 5 mg IV tid (no reglan at home).  Full liquid diet. Latest abdomen films were 7 d ago.    UE tremor.  This could possibly be due to new addition of Reglan.   New cough, resp distress.   CXR w vascular crowding and atx. Excellent sats w 2 K [place] oxygen.       Hx of PE, DVT.  Eliquis at baseline.  Currently covered with Lovenox.   PLAN     ? Decrease dose of reglan?, will d/w Dr Candis Schatz.     Repeat portable KUB?   May be safe to advance him to a soft diet but with his cough and respiratory issues, probably best to leave full liquids in place for the time being    Azucena Freed  08/12/2022, 1:06 PM Phone [phone]  ----------------------------------------------------------------------------------  I have taken a history, reviewed the chart and examined the patient. I performed a substantive portion of this encounter, including complete performance of at least one of the key components, in conjunction with the APP. I agree with the APP's note, impression and recommendations  Expand All Collapse All  [facility] ROUNDING NOTE     Subjective: Patient had emesis last night and today after eating pudding.  He has been able to tolerate clear liquids without any nausea/vomiting.  He had a bowel movement this afternoon and reports feeling better. Wife was noticing some involuntary shaking movements of his right arm earlier which were transient.     Objective: Vital signs in last 24 hours: Temp:  [97.5 F (36.4 C)-98 F (36.7 C)] 97.5 F (36.4 C) (11/28 2000) Pulse Rate:  [81-99] 88 (11/28 2000) Resp:  [18-23] 23 (11/28 2000) BP: (127-165)/(58-90) 128/60 (11/28 2000) SpO2:  [93 %-96 %] 95 % (11/28 2000) Weight:  [134.8 kg] 134.8 kg (11/28 0420) Last BM Date : 08/09/22 General: Chronically ill appearing Caucasian male, NAD, hard of hearing Lungs:  CTA b/l, no w/r/r Heart:  RRR, no m/r/g Abdomen:  Soft, NT, ND,  bowel sounds normoactive, not high pitched or tinkling.   Intake/Output from previous day: 11/27 0701 - 11/28 0700 In: 290 [P.O.:290] Out: 1100 [Urine:1100] Intake/Output this shift: No intake/output data recorded.     Lab Results: Recent Labs (last 2 labs)  No results for input(s): "WBC", "HGB", "PLT", "MCV" in the last 72 hours.   BMET Recent Labs (last 2 labs)      Recent Labs    08/10/22 0051 08/11/22 0843  NA 137 140  K 3.4* 4.0  CL 107 109  CO2 22 22  GLUCOSE 172* 123*  BUN 5* <5*  CREATININE 1.07 1.08  CALCIUM 8.6* 8.6*      LFT Recent Labs (last 2 labs)  No results for input(s): "PROT", "ALBUMIN", "AST", "ALT", "ALKPHOS", "BILITOT", "BILIDIR", "IBILI" in the last 72 hours.   PT/INR Recent Labs (last 2 labs)  No results for input(s): "INR" in the last 72 hours.  Imaging/Other results: Imaging Results (Last 48 hours)  No results found.         Assessment and Plan:   71 year old male with complicated history to include CAD, HFpEF, hx of DVT/PE, long standing DM with previous diagnosis of gastroparesis, now bed bound because of severe peripheral neuropathy, admitted with refractory nausea and vomiting with pain radiating to the back.  Initial presentation was concerning for possible NSTEMI due to elevated troponins, but ACS since been ruled out.   His initial CT showed diffuse dilated loops of small bowel concerning for SBO vs ileus.  General surgery initially consulted but feel that mechanical obstruction unlikely at this point.  He initially had NG tube output, but this has since been removed and he was tolerating liquids.   Overall, his symptoms and imaging are suggestive of an underlying dysmotility, likely involving the stomach and small bowel, likely related to neuropathy.    A repeat gastric emptying study should be considered and then consideration for compassionate therapies such as gastric pacemaker or domperidone.   He is tolerating a  CLD well, but still has had problems with pudding alone.  I think pursuing other dietary options such as yogurt, apple sauce or other full liquids would be a good next step.  He became volume overloaded yesterday as manifested by cough and shortness of breath.  He is being diuresed with improvement in the symptoms.  Because of his severe cough, his p.o. intake has been minimal. No plans to change medications at this point.  Recommend he continue to try different full liquids to see if he better tolerates them.  If tolerating other full liquids, then would recommend advancing to a soft diet.  Nutrition suggested core track feeding.  Because of his malnutrition, certainly would be reasonable.  However the patient does not desire a feeding tube.  Additionally, because of his debilitated state and recent decline, he is meeting with palliative care.  His wife has noticed tremor-like movements of his arms bilaterally.  This would be a very atypical manifestation of tardive dyskinesia.  I recommended that she record the next episode with her phone.  I do not recommend we stop the Reglan at this time based on the low likelihood that these movements are related to TD.   Ileus/gastroparesis - Continue Reglan 10 mg IV TID for now - Continue full liquid diet; avoid pudding as, this has been a trigger for vomiting;patient to try other full liquid options this evening - Consider repeat GES as inpatient (would need to off all prokinetic medications);  - Outpatient referral to tertiary facility/motility center once discharged if patient shows significant improvement -Agree with palliative care consult.    Mar Walmer E. Candis Schatz, MD [facility]

## 2022-08-12 NOTE — Plan of Care (Signed)
  Problem: Activity: Goal: Ability to tolerate increased activity will improve Outcome: Progressing   Problem: Cardiac: Goal: Ability to achieve and maintain adequate cardiovascular perfusion will improve Outcome: Progressing   Problem: Health Behavior/Discharge Planning: Goal: Ability to safely manage health-related needs after discharge will improve Outcome: Progressing   Problem: Clinical Measurements: Goal: Ability to maintain clinical measurements within normal limits will improve Outcome: Progressing   Problem: Clinical Measurements: Goal: Respiratory complications will improve Outcome: Progressing

## 2022-08-12 NOTE — Progress Notes (Signed)
Anticoagulation CONSULT NOTE   Pharmacy may adjust Lovenox Indication: VTE prophylaxis > back to treatment dosing for hx DVT/PE on Eliquis PTA   Patient Measurements: Height: 6' 1"  (185.4 cm) Weight: (!) 136.2 kg (300 lb 4.3 oz) IBW/kg (Calculated) : 79.9 BMI = 39.2  Vital Signs: Temp: 97.5 F (36.4 C) (11/29 1219) Temp Source: Oral (11/29 1219) BP: 139/67 (11/29 1219) Pulse Rate: 95 (11/29 1300) Intake/Output from previous day: 11/28 0701 - 11/29 0700 In: 5571.6 [P.O.:240; I.V.:5331.6] Out: 1350 [Urine:1350] Intake/Output from this shift: Total I/O In: 10 [P.O.:10] Out: 1150 [Urine:1150]  Labs: Recent Labs    08/10/22 0051 08/11/22 0843 08/12/22 0617  WBC  --   --  6.4  HGB  --   --  11.4*  HCT  --   --  35.4*  PLT  --   --  238  CREATININE 1.07 1.08  --   MG 1.4* 1.7  --     Estimated Creatinine Clearance: 88.2 mL/min (by C-G formula based on SCr of 1.08 mg/dL).   Microbiology: No results found for this or any previous visit (from the past 720 hour(s)).  Medical History: Past Medical History:  Diagnosis Date   Anal fissure    Asthma    Cardiomyopathy ([place])    CHF (congestive heart failure) ([place])    Coronary artery disease    Diabetes mellitus without complication ([place])    History of DVT (deep vein thrombosis)    History of ulcer disease    Hyperlipidemia    Hypertension    Peripheral neuropathy    Sleep apnea      Assessment: Prior to admission pt on Eliquis for hx DVT/PE, initially changed to heparin upon admission while ACS ruled out.  Then changed to Lovenox for DVT prophylaxis.  Discussed with Dr. Broadus John, okay to resume full/treatment dose today while pt is unable to take Eliquis.  Received 70 mg of Lovenox this AM at ~ 11.   Plan:  Increase Lovenox to 140 mg q 12 hrs (1 mg/kg). CBC q 72 hrs while on Lovenox.  [place] Crane, Vena Austria, BCPS, BCCP Clinical Pharmacist  08/12/2022 1:20 PM   [facility] pharmacy phone numbers are listed on  [email or web address]

## 2022-08-12 NOTE — Progress Notes (Signed)
   08/12/22 1219  Vitals  Temp (!) 97.5 F (36.4 C)  Temp Source Oral  BP 139/67  MAP (mmHg) 84  BP Location Left Arm  BP Method Automatic  Patient Position (if appropriate) Lying  Pulse Rate (!) 108  Pulse Rate Source Monitor  ECG Heart Rate (!) 108  Resp (!) 23  MEWS COLOR  MEWS Score Color Yellow  Oxygen Therapy  SpO2 95 %  O2 Device Nasal Cannula  O2 Flow Rate (L/min) 2 L/min  Pain Assessment  Pain Scale 0-10  Pain Score 0  MEWS Score  MEWS Temp 0  MEWS Systolic 0  MEWS Pulse 1  MEWS RR 1  MEWS LOC 0  MEWS Score 2     NT notified RN that patient BUE started "tremoring". RN rounded on patient. Wife noted that patient "shook both of his arms twice". As RN listen to wife, patient tremored his RUE for 1-3 seconds. RN performed BUE neurovascular check and no deficits noted. MD paged and notified. Passenger transport manager paged and notified.

## 2022-08-12 NOTE — Progress Notes (Signed)
Initial Nutrition Assessment  DOCUMENTATION CODES:   Not applicable  INTERVENTION:  If aligns with GOC, would recommend Cortrak placement for supplemental nutrition given inadequate PO intake for 10 days Boost Breeze po TID, each supplement provides 250 kcal and 9 grams of protein MVI with minerals daily  NUTRITION DIAGNOSIS:   Inadequate oral intake related to vomiting, poor appetite as evidenced by per patient/family report.  GOAL:   Patient will meet greater than or equal to 90% of their needs  MONITOR:   PO intake, Supplement acceptance, Diet advancement, Labs, Weight trends, I & O's  REASON FOR ASSESSMENT:   LOS    ASSESSMENT:   Pt admitted with n/v. Found to have NSTEMI. PMH significant for T2DM, DVT/PE, HTN, HFrEF, ICM, multiple NSTEMI's within the last several months. Food allergies: chocolate and strawberry   Upon admission, pt noted to have pSBO. Pt intermittently experiencing nausea throughout admission. Per Surgery, no evidence of current SBO, pt with dysmotility and gastroparesis. GI increased reglan and plans for motility studies.   Spoke with pt and his wife at bedside. His wife provided most history as he is HOH. She states that up until 11/18 he was eating at his baseline. He had dinner 11/17 and began to feel sick. 11/18 he ate brunch and this is when he began to experience nausea and vomiting. This was the last meal he has been able to tolerate. She states that despite being on a full liquid diet, he continues to experience emesis and does not feel like eating. He tried pudding yesterday but suspects this was too thick and it came back up. She mentions GI recommended apple sauce and thinner textured menu items.   She endorses his long time history of gastroparesis and GERD and that she tries to avoid feeding him foods such as fried and spicy foods, as well as other foods that may be difficult to digest.   Provided a Boost breeze for pt to try during visit,  however let on bedside table as pt experiencing a coughing fit with clear secretions/emesis.    Briefly obtained pt's thoughts on Cortrak tube feeding for supplemental nutrition until his PO intake begins to improve. Initially he was agreeable but after re-entering with supplement his wife mentioned he was not on board with this as he misunderstood d/t HOH. Explained the purpose of Cortrak tube feeding and addressed all concerns and questions at this time.   Pt has remained on FLD x4 days without ability to advance to more a more solid diet. Reached out to MD and GI with recommendation for alternate nutrition access. Plans to wait until PMT assessment.   Pt does not currently meet criteria for malnutrition, however he is at high risk given 10 days of inadequate PO intake.   Pt's weight has increased throughout admission likely r/t volume status.  Admit weight: 126.9 kg Current weight 136.2 kg  Edema: mild BLE, moderate pitting BLE  Pt is bedbound, would suspect muscle depletions which are likely masked by edema.   Medications: SSI 0-9 q4h, semglee 25 units daily, reglan. Protonix, florastor, senna  Labs: BUN <5, CBG's 103-158 x24 hours  UOP: 1350L x24 hours I/O's: +1114m since admit  NUTRITION - FOCUSED PHYSICAL EXAM:  Flowsheet Row Most Recent Value  Orbital Region No depletion  Upper Arm Region No depletion  Thoracic and Lumbar Region No depletion  Buccal Region No depletion  Temple Region No depletion  Clavicle Bone Region No depletion  Clavicle and Acromion Bone Region  No depletion  Scapular Bone Region No depletion  Dorsal Hand No depletion  Patellar Region No depletion  Anterior Thigh Region No depletion  Posterior Calf Region No depletion  Edema (RD Assessment) Moderate  Hair Reviewed  Eyes Reviewed  Mouth Reviewed  Skin Reviewed  Nails Reviewed      Diet Order:   Diet Order             Diet full liquid Room service appropriate? Yes; Fluid consistency: Thin   Diet effective now                   EDUCATION NEEDS:   Education needs have been addressed  Skin:  Skin Assessment: Reviewed RN Assessment (wound to L upper, mid chest)  Last BM:  11/28  Height:   Ht Readings from Last 1 Encounters:  08/02/22 6' 1"  (1.854 m)    Weight:   Wt Readings from Last 1 Encounters:  08/12/22 (!) 136.2 kg    Ideal Body Weight:  83.6 kg  BMI:  Body mass index is 39.62 kg/m.  Estimated Nutritional Needs:   Kcal:  2000-2200  Protein:  110-125g  Fluid:  >/=2L  Clayborne Dana, RDN, LDN Clinical Nutrition

## 2022-08-12 NOTE — Progress Notes (Signed)
Patient complained of SHOB and wanted O2 placed, O2 placed via nasal cannula @ 2L. Sats WNL. Also complained of chest pain from coughing and requested Tylenol-given. PRN sleep medication also given.

## 2022-08-13 DIAGNOSIS — K56609 Unspecified intestinal obstruction, unspecified as to partial versus complete obstruction: Secondary | ICD-10-CM | POA: Diagnosis not present

## 2022-08-13 DIAGNOSIS — R5381 Other malaise: Secondary | ICD-10-CM | POA: Diagnosis not present

## 2022-08-13 DIAGNOSIS — R112 Nausea with vomiting, unspecified: Secondary | ICD-10-CM | POA: Diagnosis not present

## 2022-08-13 DIAGNOSIS — Z7189 Other specified counseling: Secondary | ICD-10-CM

## 2022-08-13 DIAGNOSIS — I214 Non-ST elevation (NSTEMI) myocardial infarction: Secondary | ICD-10-CM | POA: Diagnosis not present

## 2022-08-13 DIAGNOSIS — Z515 Encounter for palliative care: Secondary | ICD-10-CM | POA: Diagnosis not present

## 2022-08-13 DIAGNOSIS — K5989 Other specified functional intestinal disorders: Secondary | ICD-10-CM | POA: Diagnosis not present

## 2022-08-13 DIAGNOSIS — G629 Polyneuropathy, unspecified: Secondary | ICD-10-CM | POA: Diagnosis not present

## 2022-08-13 DIAGNOSIS — K3184 Gastroparesis: Secondary | ICD-10-CM | POA: Diagnosis not present

## 2022-08-13 LAB — COMPREHENSIVE METABOLIC PANEL
ALT: 19 U/L (ref 0–44)
AST: 42 U/L — ABNORMAL HIGH (ref 15–41)
Albumin: 2.3 g/dL — ABNORMAL LOW (ref 3.5–5.0)
Alkaline Phosphatase: 43 U/L (ref 38–126)
Anion gap: 12 (ref 5–15)
BUN: 6 mg/dL — ABNORMAL LOW (ref 8–23)
CO2: 19 mmol/L — ABNORMAL LOW (ref 22–32)
Calcium: 9 mg/dL (ref 8.9–10.3)
Chloride: 107 mmol/L (ref 98–111)
Creatinine, Ser: 1.19 mg/dL (ref 0.61–1.24)
GFR, Estimated: >60 mL/min (ref >60)
Glucose, Bld: 192 mg/dL — ABNORMAL HIGH (ref 70–99)
Potassium: 4.2 mmol/L (ref 3.5–5.1)
Sodium: 138 mmol/L (ref 135–145)
Total Bilirubin: 0.5 mg/dL (ref 0.3–1.2)
Total Protein: 5.8 g/dL — ABNORMAL LOW (ref 6.5–8.1)

## 2022-08-13 LAB — GLUCOSE, CAPILLARY
Glucose-Capillary: 153 mg/dL — ABNORMAL HIGH (ref 70–99)
Glucose-Capillary: 170 mg/dL — ABNORMAL HIGH (ref 70–99)
Glucose-Capillary: 173 mg/dL — ABNORMAL HIGH (ref 70–99)
Glucose-Capillary: 174 mg/dL — ABNORMAL HIGH (ref 70–99)
Glucose-Capillary: 186 mg/dL — ABNORMAL HIGH (ref 70–99)
Glucose-Capillary: 203 mg/dL — ABNORMAL HIGH (ref 70–99)

## 2022-08-13 LAB — ECHOCARDIOGRAM COMPLETE
AR max vel: 2.81 cm2
AV Area VTI: 3.01 cm2
AV Area mean vel: 2.39 cm2
AV Mean grad: 2 mmHg
AV Peak grad: 4.2 mmHg
Ao pk vel: 1.03 m/s
Area-P 1/2: 2.9 cm2
Calc EF: 46.8 %
Height: 73 in
S' Lateral: 4 cm
Single Plane A2C EF: 48.2 %
Single Plane A4C EF: 46.8 %
Weight: 4437.42 oz

## 2022-08-13 LAB — MAGNESIUM: Magnesium: 1.6 mg/dL — ABNORMAL LOW (ref 1.7–2.4)

## 2022-08-13 MED ORDER — GUAIFENESIN-DM 100-10 MG/5ML PO SYRP
5.0000 mL | ORAL_SOLUTION | ORAL | Status: DC | PRN
  Administered 2022-08-13: 5 mL via ORAL

## 2022-08-13 MED ORDER — MAGNESIUM SULFATE 2 GM/50ML IV SOLN
2.0000 g | Freq: Once | INTRAVENOUS | Status: AC
  Administered 2022-08-13: 2 g via INTRAVENOUS
  Filled 2022-08-13: qty 50

## 2022-08-13 MED ORDER — BENZONATATE 100 MG PO CAPS
200.0000 mg | ORAL_CAPSULE | Freq: Three times a day (TID) | ORAL | Status: DC
  Administered 2022-08-13 (×2): 200 mg via ORAL
  Filled 2022-08-13 (×2): qty 2

## 2022-08-13 MED ORDER — GUAIFENESIN-DM 100-10 MG/5ML PO SYRP
5.0000 mL | ORAL_SOLUTION | Freq: Three times a day (TID) | ORAL | Status: DC
  Administered 2022-08-13 – 2022-08-14 (×2): 5 mL via ORAL
  Filled 2022-08-13 (×3): qty 5

## 2022-08-13 NOTE — Progress Notes (Signed)
   08/13/22 2137  Assess: MEWS Score  Resp (!) 24  Assess: MEWS Score  MEWS Temp 0  MEWS Systolic 0  MEWS Pulse 2  MEWS RR 1  MEWS LOC 0  MEWS Score 3  MEWS Score Color Yellow  Assess: if the MEWS score is Yellow or Red  Were vital signs taken at a resting state? Yes  Focused Assessment No change from prior assessment  Does the patient meet 2 or more of the SIRS criteria? No  Does the patient have a confirmed or suspected source of infection? No  MEWS guidelines implemented *See Row Information* No, other (Comment) (pt is breathing better and still resting)  Document  Patient Outcome Stabilized after interventions  Progress note created (see row info) Yes  Assess: SIRS CRITERIA  SIRS Temperature  0  SIRS Pulse 1  SIRS Respirations  1  SIRS WBC 1  SIRS Score Sum  3

## 2022-08-13 NOTE — Progress Notes (Signed)
   08/13/22 1940  Assess: MEWS Score  BP 129/70  Assess: MEWS Score  MEWS Temp 0  MEWS Systolic 0  MEWS Pulse 2  MEWS RR 2  MEWS LOC 0  MEWS Score 4  MEWS Score Color Red  Assess: if the MEWS score is Yellow or Red  Were vital signs taken at a resting state? Yes  Focused Assessment No change from prior assessment  Does the patient meet 2 or more of the SIRS criteria? No  Does the patient have a confirmed or suspected source of infection? No  MEWS guidelines implemented *See Row Information* No, previously red, continue vital signs every 4 hours  Document  Patient Outcome Stabilized after interventions  Progress note created (see row info) Yes

## 2022-08-13 NOTE — Progress Notes (Addendum)
Progress Note  Primary GI:  As an inpatient he has been seen by several GI groups in town, [place], [place] and Dr. Lyndel Safe.  As outpatient has seen Dr. Carlton Adam at [facility] 12/2019.    Subjective  Chief Complaint: Intermittent nausea and vomiting with gastroparesis and history of peptic ulcer disease  Wife at bedside provides majority of the history.  Patient had coughing yesterday evening with subsequent vomiting of bile.  NG tube placed yesterday evening.  Currently on 2 L [place] and coughing. Patient states hands feel slightly swollen.  Wife states feels like he is slightly fluid overloaded, had Lasix yesterday. Patient states he is thirsty requesting fluids.  Has been able to tolerate ice chips and some medications through NG tube.   Objective   Vital signs in last 24 hours: Temp:  [97.5 F (36.4 C)-98.3 F (36.8 C)] 98.3 F (36.8 C) (11/30 0700) Pulse Rate:  [95-125] 98 (11/30 0700) Resp:  [16-28] 19 (11/30 0700) BP: (109-198)/(64-167) 118/64 (11/30 0700) SpO2:  [94 %-99 %] 96 % (11/30 0700) Weight:  [168 kg] 135 kg (11/30 0400) Last BM Date : 08/11/22 Last BM recorded by nurses in past 5 days Stool Type: Type 6 (Mushy consistency with ragged edges) (08/09/2022  1:28 PM)  General:   Morbidly obese chronically ill-appearing male Heart:  Regular rate and rhythm; no murmurs Pulm: 2 L nasal cannula, coughing, has some decreased breath sounds rhonchi right side predominantly Abdomen:  Soft, Obese AB, decreased bowel sounds. No tenderness . Without guarding and Without rebound, No organomegaly appreciated.  NG tube in place with 200 cc bilious fluid. Extremities:  with bilateral lower extremity edema, wrapped with compression wrap  Neurologic:  Alert and  oriented x4;  No focal deficits.  Psych:  Cooperative. Normal mood and affect.  Intake/Output from previous day: 11/29 0701 - 11/30 0700 In: 60 [P.O.:10; IV Piggyback:50] Out: 3729 [Urine:2950; Emesis/NG  output:100] Intake/Output this shift: No intake/output data recorded.  Studies/Results: DG Abd Portable 1V  Result Date: 08/12/2022 CLINICAL DATA:  NG tube placement EXAM: PORTABLE ABDOMEN - 1 VIEW COMPARISON:  Same-day abdominal radiograph FINDINGS: Limited radiograph of the lower chest and upper abdomen was obtained for the purposes of enteric tube localization. Enteric tube is seen coursing below the diaphragm with distal tip and side port terminating within the expected location of the proximal stomach. IMPRESSION: Enteric tube tip and side port project within the expected location of the proximal stomach. Electronically Signed   By: Davina Poke D.O.   On: 08/12/2022 17:58   DG Abd 1 View  Result Date: 08/12/2022 CLINICAL DATA:  Vomiting. EXAM: ABDOMEN - 1 VIEW COMPARISON:  08/06/2022 FINDINGS: A small amount of enteric contrast remains within the rectum and distal sigmoid colon. There is persistent gaseous distention of several loops of upstream small bowel with index loop within the left mid abdomen measuring 4.2 cm in diameter. No significant colonic gaseous distention. No pneumoperitoneum, pneumatosis or portal venous gas. Post cholecystectomy. No acute osseous abnormalities. Degenerative change of the bilateral hips suspected though incompletely evaluated. IMPRESSION: 1. Findings most suggestive of ileus with mild gaseous distention of several loops of small bowel but without evidence of enteric obstruction. 2. Small amount enteric contrast remains within the rectum distal sigmoid colon. Electronically Signed   By: Sandi Mariscal M.D.   On: 08/12/2022 17:47   DG CHEST PORT 1 VIEW  Result Date: 08/12/2022 CLINICAL DATA:  Hypoxia and cough. EXAM: PORTABLE CHEST 1 VIEW COMPARISON:  07/17/2022 FINDINGS: The cardiac silhouette, mediastinal and hilar contours are prominent but unchanged. Very low lung volumes with vascular crowding and atelectasis. No definite infiltrates or effusions.  IMPRESSION: Very low lung volumes with vascular crowding and atelectasis. Electronically Signed   By: Marijo Sanes M.D.   On: 08/12/2022 08:53    Lab Results: Recent Labs    08/12/22 0617  WBC 6.4  HGB 11.4*  HCT 35.4*  PLT 238   BMET Recent Labs    08/11/22 0843 08/13/22 0153  NA 140 138  K 4.0 4.2  CL 109 107  CO2 22 19*  GLUCOSE 123* 192*  BUN <5* 6*  CREATININE 1.08 1.19  CALCIUM 8.6* 9.0   LFT Recent Labs    08/13/22 0153  PROT 5.8*  ALBUMIN 2.3*  AST 42*  ALT 19  ALKPHOS 43  BILITOT 0.5   PT/INR No results for input(s): "LABPROT", "INR" in the last 72 hours.   Scheduled Meds:  enoxaparin (LOVENOX) injection  140 mg Subcutaneous Q12H   furosemide  40 mg Intravenous BID   insulin aspart  0-9 Units Subcutaneous Q4H   insulin glargine-yfgn  25 Units Subcutaneous Daily   latanoprost  1 drop Left Eye QHS   metoCLOPramide (REGLAN) injection  10 mg Intravenous TID AC   metoprolol tartrate  5 mg Intravenous Q8H   multivitamin with minerals  1 tablet Oral Daily   pantoprazole  40 mg Oral BID   saccharomyces boulardii  250 mg Oral BID   senna  1 tablet Oral Daily   Continuous Infusions:    Impression/Plan:   71 year old male with complicated history including coronary disease, heart failure preserved EF, history of DVT PE, longstanding diabetes, previous diagnosis of gastroparesis, bedbound due to severe peripheral neuropathy presented with nausea vomiting.  Back initially concerning for non-STEMI however has been ruled out.  Ileus/motility issues with gastroparesis complicated by immobility and bedbound status.  -TEE showed diffuse dilated loops small bowel concerning SBO versus ileus. -Surgery saw patient unlikely mechanical obstruction. -NG tube was removed until last night when patient had coughing with bilious vomiting. 200 cc bilious output, requesting fluids.  Believes this is more dysmotility rather than true ileus.     -Can do trial today  clamping the tube with fluids.  Has been taking meds per tube.  If patient has worsening nausea vomiting, unclamp tube. -Continue to maintain magnesium above 2 and potassium at 4-4.5.  -Minimize narcotics and anticholinergic medications -Continue Reglan 10 mg 3 times daily -Continue Senokot 8.6 p.o., can add on Dulcolax suppository which patient responded well to previously. -When patient is able to tolerate p.o. again consider adding Motegrity - get daily portable KUB daily -Maintain adequate hydration with IVF maintenance -Roll patient side-to-side Q2H - pending palliative care consult and hospital course, consider tertiary facility with motility specialist.   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 I performed a substantive portion of this encounter, including complete performance of at least one of the key components, in conjunction with the APP. I agree with the APP's note, impression and recommendations  Patient's coughing became severe yesterday evening and he developed worsening nausea and vomiting with bilious fluid, prompting NG tube  placement.  Repeat plain film again showed dilated small bowel loops consistent with ileus.  A small amount of fluid returned from the NG tube.  His cough is improved with ongoing diuresis, but is still very bothersome.  He denies abdominal pain and he reports passing flatus today.  Patient does not have a bowel obstruction, but does appear to have significant dysmotility affecting his ability to eat.  His worsening symptoms yesterday may have been more post-tussive in nature rather than GI (symptoms did not worsen after eating, but with protracted coughing)  Recommend clamping of NG tube and adding ice chips; if tolerated, add clears back and then remove NG tube. Once tolerating clears again, a trial of Motegrity is reasonable to see if this may help his intestinal dysfunction. Continue Reglan at current dose for now.   Crystalyn Delia E. Candis Schatz, MD [facility]

## 2022-08-13 NOTE — Progress Notes (Addendum)
PROGRESS NOTE  Randy Liu LHT:[id number] DOB: March 08, 1951 DOA: 08/07/2022 PCP: [name], MD   LOS: 11 days   Brief Narrative / Interim history: 71 year old male with history of DM 2, DVT/PE on Eliquis, HTN, systolic CHF, ischemic cardiomyopathy with underlying CAD and multiple non-STEMI's over the past several months, significant debility being bedbound for the past 2 years comes into the hospital with nausea and vomiting.  There was concern for small bowel obstruction and surgery was consulted.  Clinically improved but then worsened again, felt not to have SBO but gastroparesis/dysmotility.  Felt to be fluid overloaded on 11/29 and started on IV Lasix.  GI following  Subjective / 24h Interval events: He is feeling well this morning.  Appreciates his hands are quite swollen.  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  General surgery consulted and followed.  This improved, his NG tube was discontinued, his diet was advanced and started having BMs.  Unfortunately had recurrent symptoms without obstruction or ileus, and gastroenterology was consulted.  He was placed on Reglan and NG tube was reinserted 11/29.   Continue conservative management -Currently on IV Reglan  Active problems Acute on chronic chronic systolic CHF -patient was on IV fluids for about 10 days, and she still is significantly fluid overloaded.  He is n.p.o. however needs a degree of diuresis, continue furosemide today.  2D echo was done on 11/21, read pending  CAD with prior PCI, Demand ischemia -No chest pain or shortness of breath.  Cardiology consulted, Dr. Einar Gip evaluated patient on 11/19, patient is asymptomatic and was on heparin , stopped after 48 hours.  He was on Eliquis but due to n.p.o. now he is on Lovenox.  He is not a candidate for cardiac evaluation given profound illness, essentially bedbound.  Continue scheduled IV metoprolol for now.  When no longer n.p.o. has to go back on Lasix   Goals of care-patient appears to be significantly debilitated, completely bedbound for close to 2 years and has not walked in over 3 years, with multiple medical problems, severe three-vessel CAD, CHF and ongoing failure to thrive, third spacing.  Palliative care consulted   History of GI ulcers s/p duodenal polyp clipping- Continue IV Protonix.  Hemoglobin stable.   H/o DVT/PE -On Eliquis at baseline, currently on DVT prophylaxis dose of Lovenox, will increase to full dose Lovenox until GI symptoms improve   Essential hypertension -Continue current medications.  Dyslipidemia/statin intolerance-Fenofibrate on hold.   Stage IIIa CKD-Stable at baseline.   Sleep apnea -on CPAP at night at home.   Hypokalemia/hypomagnesemia-Repleted  Type 2 diabetes mellitus-Blood sugar controlled.  Continue Semglee 25 units and SSI.   CBG (last 3)  Recent Labs    08/13/22 0022 08/13/22  0402 08/13/22 0721  GLUCAP 174* 186* 203*   Scheduled Meds:  enoxaparin (LOVENOX) injection  140 mg Subcutaneous Q12H   furosemide  40 mg Intravenous BID   insulin aspart  0-9 Units Subcutaneous Q4H   insulin glargine-yfgn  25 Units Subcutaneous Daily    latanoprost  1 drop Left Eye QHS   metoCLOPramide (REGLAN) injection  10 mg Intravenous TID AC   metoprolol tartrate  5 mg Intravenous Q8H   multivitamin with minerals  1 tablet Oral Daily   pantoprazole  40 mg Oral BID   saccharomyces boulardii  250 mg Oral BID   senna  1 tablet Oral Daily   Continuous Infusions: PRN Meds:.acetaminophen, alum & mag hydroxide-simeth, guaiFENesin-dextromethorphan, nitroGLYCERIN, ondansetron (ZOFRAN) IV, phenol, traZODone  Current Outpatient Medications  Medication Instructions   albuterol (VENTOLIN HFA) 108 (90 Base) MCG/ACT inhaler 2 puffs, Inhalation, Every 6 hours PRN   Align 4 mg, Oral, Daily   Alpha-Lipoic Acid 600 mg, Oral, Daily   benzonatate (TESSALON) 200 mg, Oral, Every 8 hours PRN   Cholecalciferol 4,000 Units, Oral, Daily   clopidogrel (PLAVIX) 75 MG tablet TAKE ONE (1) TABLET BY MOUTH EVERY DAY   colchicine-probenecid 0.5-500 MG tablet 1 tablet, Oral, Daily   cyanocobalamin (VITAMIN B12) 1,000 mcg, Subcutaneous, Every 30 days   Eliquis 5 mg, Oral, 2 times daily, Resume on 05/11/21   Evolocumab (REPATHA SURECLICK) 035 MG/ML SOAJ 1 mL, Subcutaneous, Every 14 days   famotidine-calcium carbonate-magnesium hydroxide (PEPCID COMPLETE) 10-800-165 MG chewable tablet 1 tablet, Oral, Daily PRN   fenofibrate 160 mg, Oral, Daily   fluticasone (FLONASE) 50 MCG/ACT nasal spray 1 spray, Each Nare, Daily PRN   guaiFENesin-codeine 100-10 MG/5ML syrup 5 mLs, Oral, 3 times daily PRN   insulin aspart protamine- aspart (NOVOLOG MIX 70/30) (70-30) 100 UNIT/ML injection 40-85 Units, Subcutaneous, 2 times daily, Sliding scale: <BR>100-140 : 40 units <BR>141-180 : 45 units <BR>181-220 : 50 units <BR>221-260 : 55 units <BR>261-300 : 60 units <BR>301-340 : 65 units <BR>341-380 : 70 units <BR>381-420 : 75 units <BR>421-460 : 80 units <BR>461-500 : 85 units - recheck in 1 hour and notify MD<BR>   latanoprost (XALATAN) 0.005 % ophthalmic solution 1 drop, Left Eye, Daily at  bedtime   loratadine (CLARITIN) 10 mg, Oral, Daily   magnesium oxide (MAG-OX) 400 mg, Oral, 3 times daily   meclizine (ANTIVERT) 25 mg, Oral, 3 times daily PRN   melatonin 5 mg, Oral, Daily at bedtime   metoCLOPramide (REGLAN) 5 mg, Oral, Daily at bedtime   metoprolol succinate (TOPROL-XL) 12.5 mg, Oral, Daily   montelukast (SINGULAIR) 10 mg, Oral, Daily at bedtime   nitroGLYCERIN (NITROSTAT) 0.4 mg, Sublingual, Every 5 min PRN   ondansetron (ZOFRAN-ODT) 4 mg, Oral, Every 8 hours PRN   pantoprazole (PROTONIX) 40 mg, Oral, 2 times daily   polyethylene glycol (MIRALAX / GLYCOLAX) 17 g, Oral, 2 times daily   senna (SENOKOT) 8.6 MG TABS tablet 1 tablet, Oral, Daily   sodium chloride (MURO 128) 5 % ophthalmic ointment 1 application , Both Eyes, At bedtime PRN   sucralfate (CARAFATE) 1 g, Oral, 2 times daily   torsemide (DEMADEX) 5 mg, Oral, Daily   Vascepa 1 g, Oral, 2 times daily    Diet Orders (From admission, onward)     Start     Ordered   08/13/22 0732  Diet NPO time specified Except for: Ice Chips  Diet effective now       Comments: Limit ice chips to no  more than a cup per 24h, perhaps offer 1/5 of a cup at a time  Question:  Except for  Answer:  Ice Chips   08/13/22 0732            DVT prophylaxis: SCDs Start: 08/02/22 0454   Lab Results  Component Value Date   PLT 238 08/12/2022      Code Status: Full Code  Family Communication: Wife at bedside  Status is: Inpatient  Remains inpatient appropriate because: NPO, NG tube  Level of care: Telemetry Medical  Consultants:  Cardiology Surgery GI Palliative  Objective: Vitals:   08/12/22 1957 08/13/22 0000 08/13/22 0400 08/13/22 0700  BP: 135/76 109/64 125/66 118/64  Pulse: (!) 124 (!) 108 (!) 113 98  Resp: (!) 22 18 20 19   Temp: 97.8 F (36.6 C) 98 F (36.7 C) 97.6 F (36.4 C) 98.3 F (36.8 C)  TempSrc: Oral Oral Oral Oral  SpO2: 97% 96% 97% 96%  Weight:   135 kg   Height:        Intake/Output  Summary (Last 24 hours) at 08/13/2022 1028 Last data filed at 08/13/2022 0405 Gross per 24 hour  Intake 50 ml  Output 3050 ml  Net -3000 ml   Wt Readings from Last 3 Encounters:  08/13/22 135 kg  04/27/22 126.6 kg  04/19/22 127 kg    Examination:  Constitutional: NAD Eyes: no scleral icterus ENMT: Mucous membranes are moist.  Neck: normal, supple Respiratory: clear to auscultation bilaterally, no wheezing, no crackles.  Cardiovascular: Regular rate and rhythm, no murmurs / rubs / gallops.  1+ edema to legs and hands Abdomen: non distended, no tenderness. Bowel sounds positive.  Musculoskeletal: no clubbing / cyanosis.  Skin: no rashes Neurologic: non focal   Data Reviewed: I have independently reviewed following labs and imaging studies   CBC Recent Labs  Lab 08/07/22 0040 08/12/22 0617  WBC 5.5 6.4  HGB 11.3* 11.4*  HCT 36.6* 35.4*  PLT 207 238  MCV 94.6 92.9  MCH 29.2 29.9  MCHC 30.9 32.2  RDW 15.4 15.7*  LYMPHSABS 1.4  --   MONOABS 0.5  --   EOSABS 0.2  --   BASOSABS 0.0  --     Recent Labs  Lab 08/07/22 0040 08/08/22 0819 08/09/22 0103 08/10/22 0051 08/11/22 0843 08/13/22 0153  NA 141 137  --  137 140 138  K 3.4* 3.5  --  3.4* 4.0 4.2  CL 108 105  --  107 109 107  CO2 22 22  --  22 22 19*  GLUCOSE 109* 123*  --  172* 123* 192*  BUN 14 9  --  5* <5* 6*  CREATININE 1.19 1.00  --  1.07 1.08 1.19  CALCIUM 8.8* 8.6*  --  8.6* 8.6* 9.0  AST  --   --   --   --   --  42*  ALT  --   --   --   --   --  19  ALKPHOS  --   --   --   --   --  43  BILITOT  --   --   --   --   --  0.5  ALBUMIN  --   --   --   --   --  2.3*  MG 1.6* 1.5* 1.7 1.4* 1.7  --     ------------------------------------------------------------------------------------------------------------------ No results for input(s): "CHOL", "HDL", "LDLCALC", "TRIG", "CHOLHDL", "LDLDIRECT" in the last 72 hours.  Lab  Results  Component Value Date   HGBA1C 7.4 (H) 04/16/2022    ------------------------------------------------------------------------------------------------------------------ No results for input(s): "TSH", "T4TOTAL", "T3FREE", "THYROIDAB" in the last 72 hours.  Invalid input(s): "FREET3"  Cardiac Enzymes No results for input(s): "CKMB", "TROPONINI", "MYOGLOBIN" in the last 168 hours.  Invalid input(s): "CK" ------------------------------------------------------------------------------------------------------------------    Component Value Date/Time   BNP 142.6 (H) 04/19/2022 0327    CBG: Recent Labs  Lab 08/12/22 1607 08/12/22 1955 08/13/22 0022 08/13/22 0402 08/13/22 0721  GLUCAP 144* 185* 174* 186* 203*    No results found for this or any previous visit (from the past 240 hour(s)).   Radiology Studies: DG Abd Portable 1V  Result Date: 08/12/2022 CLINICAL DATA:  NG tube placement EXAM: PORTABLE ABDOMEN - 1 VIEW COMPARISON:  Same-day abdominal radiograph FINDINGS: Limited radiograph of the lower chest and upper abdomen was obtained for the purposes of enteric tube localization. Enteric tube is seen coursing below the diaphragm with distal tip and side port terminating within the expected location of the proximal stomach. IMPRESSION: Enteric tube tip and side port project within the expected location of the proximal stomach. Electronically Signed   By: Davina Poke D.O.   On: 08/12/2022 17:58   DG Abd 1 View  Result Date: 08/12/2022 CLINICAL DATA:  Vomiting. EXAM: ABDOMEN - 1 VIEW COMPARISON:  08/06/2022 FINDINGS: A small amount of enteric contrast remains within the rectum and distal sigmoid colon. There is persistent gaseous distention of several loops of upstream small bowel with index loop within the left mid abdomen measuring 4.2 cm in diameter. No significant colonic gaseous distention. No pneumoperitoneum, pneumatosis or portal venous gas. Post cholecystectomy. No acute osseous abnormalities. Degenerative change of the  bilateral hips suspected though incompletely evaluated. IMPRESSION: 1. Findings most suggestive of ileus with mild gaseous distention of several loops of small bowel but without evidence of enteric obstruction. 2. Small amount enteric contrast remains within the rectum distal sigmoid colon. Electronically Signed   By: Sandi Mariscal M.D.   On: 08/12/2022 17:47     Marzetta Board, MD, PhD [facility]  Between 7 am - 7 pm I am available, please contact me via Amion (for emergencies) or Securechat (non urgent messages)  Between 7 pm - 7 am I am not available, please contact night coverage MD/APP via Amion

## 2022-08-13 NOTE — Progress Notes (Signed)
   08/13/22 1922  Assess: MEWS Score  Temp 99.8 F (37.7 C)  BP (!) 128/108  MAP (mmHg) 112  Pulse Rate (!) 118  ECG Heart Rate (!) 122  Resp (!) 28  SpO2 91 %  O2 Device Room Air  Assess: MEWS Score  MEWS Temp 0  MEWS Systolic 0  MEWS Pulse 2  MEWS RR 2  MEWS LOC 0  MEWS Score 4  MEWS Score Color Red  Assess: if the MEWS score is Yellow or Red  Were vital signs taken at a resting state? Yes  Focused Assessment Change from prior assessment (see assessment flowsheet)  Does the patient meet 2 or more of the SIRS criteria? No  Does the patient have a confirmed or suspected source of infection? No  MEWS guidelines implemented *See Row Information* No, other (Comment) (will recheck the BP, pt is sleeping fine)  Document  Patient Outcome Stabilized after interventions  Progress note created (see row info) Yes  Assess: SIRS CRITERIA  SIRS Temperature  0  SIRS Pulse 1  SIRS Respirations  1  SIRS WBC 1  SIRS Score Sum  3

## 2022-08-13 NOTE — Consult Note (Addendum)
Consultation Note Date: 08/13/2022   Patient Name: Randy Liu  DOB: July 12, 1951  MRN: [id number]  Age / Sex: 71 y.o., male  PCP: [name], MD Referring Physician: Caren Griffins, MD  Reason for Consultation: Establishing goals of care  HPI/Patient Profile: 71 y.o. male  with past medical history of HFpEF, ICM, CAD, multiple NSTEMIs over past several months, DVT/PE on Eliquis, sleep apnea (uses CPAP), GI ulcers, CKD stage 3a, diabetes, peripheral neuropathy admitted on 07/22/2022 with persistent nausea/vomiting and pain through shoulder blade, neck, shoulders. Initially treated for SBO but recurrent nausea/vomiting without evidence of obstruction/ileus with potential gastroparesis/dysmotility. Concern for significant debility, bedbound ~2 yrs now and not ambulatory for ~3 yrs, severe CAD, CHF, and significant third spacing due to acute illness. Palliative requested to address goals of care due declined functional status/physical debility, failure to thrive, and multiple complex co-morbidities.   Clinical Assessment and Goals of Care: I completed thorough chart review prior to seeing patient. Noted input from hospitalist, GI, cardiology and reviewed diagnostics including labs and CXR/KUB. I met at Randy Liu's bedside along with his wife, Randy Liu. Jayvian is fatigued and exhausted from eventful couple days with limited rest due to cough related to extensive fluid overload. Naod is also hard of hearing and relies on Randy Liu heavily to help explain and relay information to him. He was present for my visit and conversation but did not really participate.   Randy Liu is well educated on her husband's health issues and follows closely with his medical team and chart. We reviewed his underlying co-morbidities and events of current hospitalization. We discussed palliative care and how we can help support them  inpatient and the benefits of transition to outpatient palliative care. They follow closely with [facility] geriatric NP and they have discussed the potential of palliative care previously. Randy Liu shares that they have celebrated 61 years of marriage in June and they have together had multiple experiences with difficult decisions and end of life with their family members so they are familiar with suffering and signs of end of life. Randy Liu shares that they are understanding of his overall poor prognosis but they are trying to maintain hope. I reassured her that the medical team will continue to do all they can to give Randy Liu as long a life as possible. We did discuss code status and resuscitation and although it does sound like Randy Liu would not desire these interventions they are not prepared to put in place a DNR as they associate this with giving up hope. They do seem open to DNR in the future if he declines further. They have had the difficult conversations over time as they both understand that his health is declining - he has been bedbound at home ~2-3 yrs now and sleeps often throughout the day. They want to focus on quality of life while also continuing measures to prolong life at this time.   Plan is time for outcomes. Randy Liu acknowledges the possibility that he could decline and not make it out of the hospital although  they are hopeful he will have some level of improvement to return home. Randy Liu continues with cough which disrupts him and he is not able to sleep. Discussed with Randy Liu and will schedule benzonatate and robitussin with robitussin does PRN in between doses to try and provide him some relief until he is able to further diurese. May consider small dose morphine 1 mg PRN if no relief.   All questions/concerns addressed. Emotional support provided. Will provide with Hard Choices booklet.   Primary Decision Maker PATIENT and wife    SUMMARY OF RECOMMENDATIONS   - Full code desired at  this time - open to consideration in the future - Time for outcomes - I will follow up again tomorrow  Code Status/Advance Care Planning: Full code   Symptom Management:  Scheduled benzonatate and robitussin DM TID. Robitussin DM continued q4h PRN cough. May consider PRN morphine 1 mg if no relief as requested by wife.   Prognosis:  Overall poor prognosis with multiple co-morbidities. High risk for acute decompensation.   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 The following aspects are pertinent.  Past Medical History:  Diagnosis Date   Anal fissure    Asthma    Cardiomyopathy ([employer])    CHF (congestive heart failure) (HCC)    Coronary artery disease    Diabetes mellitus without complication (HCC)    History of DVT (deep vein thrombosis)    History of ulcer disease    Hyperlipidemia    Hypertension    Peripheral neuropathy    Sleep apnea    Social History   Socioeconomic History   Marital status: Married    Spouse name: Not on file   Number of children: 0   Years of education: Not on file   Highest education level: Not on file  Occupational History   Not on file  Tobacco Use   Smoking status: Never   Smokeless tobacco: Never  Vaping Use   Vaping Use: Never used  Substance and Sexual Activity   Alcohol use: Not Currently   Drug use: Never   Sexual  activity: Not on file  Other Topics Concern   Not on file  Social History Narrative   Not on file   Social Determinants of Health   Financial Resource Strain: Not on file  Food Insecurity: No Food Insecurity (08/02/2022)   Hunger Vital Sign    Worried About Running Out of Food in the Last Year: Never true    Ran Out of Food in the Last Year: Never true  Transportation Needs: No Transportation Needs (08/02/2022)   PRAPARE - Hydrologist (Medical): No    Lack of Transportation (Non-Medical): No  Physical Activity: Not on file  Stress: Not on file  Social Connections: Not on file   Family History  Problem Relation Age of Onset   Heart failure Mother    Heart disease Mother  Cancer Father    Scheduled Meds:  enoxaparin (LOVENOX) injection  140 mg Subcutaneous Q12H   furosemide  40 mg Intravenous BID   insulin aspart  0-9 Units Subcutaneous Q4H   insulin glargine-yfgn  25 Units Subcutaneous Daily   latanoprost  1 drop Left Eye QHS   metoCLOPramide (REGLAN) injection  10 mg Intravenous TID AC   metoprolol tartrate  5 mg Intravenous Q8H   multivitamin with minerals  1 tablet Oral Daily   pantoprazole  40 mg Oral BID   saccharomyces boulardii  250 mg Oral BID   senna  1 tablet Oral Daily   Continuous Infusions: PRN Meds:.acetaminophen, alum & mag hydroxide-simeth, guaiFENesin-dextromethorphan, nitroGLYCERIN, ondansetron (ZOFRAN) IV, phenol, traZODone Allergies  Allergen Reactions   Nsaids Other (See Comments)    Renal Insufficiency Kidney issues    Augmentin [Amoxicillin-Pot Clavulanate] Diarrhea and Nausea And Vomiting    Abdomen pain    Statins Other (See Comments)    Myalgias Myositis     Chocolate Itching    Can tolerate if taking Benadryl   Other Other (See Comments)    All nuts - told to avoid due to GI tract issues   Spiriva Respimat [Tiotropium Bromide Monohydrate] Other (See Comments)    Urinary retention   Strawberry  (Diagnostic) Itching    Can tolerate if taking Benadryl   Jardiance [Empagliflozin] Other (See Comments)    Fainting Weakness  Lightheaded  Falling    Zyloprim [Allopurinol] Rash   Review of Systems  Constitutional:  Positive for activity change and fatigue.  Respiratory:  Positive for cough.   Gastrointestinal:  Positive for constipation.  Neurological:  Positive for weakness.    Physical Exam Vitals and nursing note reviewed.  Constitutional:      Appearance: He is ill-appearing.     Comments: Sleeping on/off during visit  Cardiovascular:     Rate and Rhythm: Normal rate.  Pulmonary:     Effort: No tachypnea, accessory muscle usage or respiratory distress.  Abdominal:     Comments: Obese; NGT to LIWS  Musculoskeletal:     Right lower leg: Edema present.     Left lower leg: Edema present.  Neurological:     Mental Status: He is alert and oriented to person, place, and time.     Comments: Hard of hearing - limits communication     Vital Signs: BP 118/64   Pulse 98   Temp 98.3 F (36.8 C) (Oral)   Resp 19   Ht _0  (1.854 m)   Wt 135 kg   SpO2 96%   BMI 39.27 kg/m  Pain Scale: 0-10   Pain Score: 0-No pain   SpO2: SpO2: 96 % O2 Device:SpO2: 96 % O2 Flow Rate: .O2 Flow Rate (L/min): 2 L/min  IO: Intake/output summary:  Intake/Output Summary (Last 24 hours) at 08/13/2022 1003 Last data filed at 08/13/2022 0405 Gross per 24 hour  Intake 50 ml  Output 3050 ml  Net -3000 ml    LBM: Last BM Date : 08/11/22 Baseline Weight: Weight: 126.9 kg Most recent weight: Weight: 135 kg     Palliative Assessment/Data:     Time In: 1015  Time Total: 85 min  Greater than 50%  of this time was spent counseling and coordinating care related to the above assessment and plan.  Signed by: Vinie Sill, NP Palliative Medicine Team Pager # [phone] (M-F 8a-5p) Team Phone # [phone] (Nights/Weekends)

## 2022-08-13 NOTE — Progress Notes (Signed)
   08/13/22 2200  Assess: MEWS Score  Pulse Rate (!) 103  Assess: MEWS Score  MEWS Temp 0  MEWS Systolic 0  MEWS Pulse 1  MEWS RR 1  MEWS LOC 0  MEWS Score 2  MEWS Score Color Yellow  Assess: if the MEWS score is Yellow or Red  Were vital signs taken at a resting state? Yes  Focused Assessment Change from prior assessment (see assessment flowsheet) (HR has improved due to meds given)  Document  Patient Outcome Stabilized after interventions  Progress note created (see row info) Yes  Assess: SIRS CRITERIA  SIRS Temperature  0  SIRS Pulse 1  SIRS Respirations  1  SIRS WBC 1  SIRS Score Sum  3

## 2022-08-14 ENCOUNTER — Inpatient Hospital Stay (HOSPITAL_COMMUNITY): Payer: Medicare Other | Admitting: Certified Registered"

## 2022-08-14 ENCOUNTER — Inpatient Hospital Stay (HOSPITAL_COMMUNITY): Payer: Medicare Other

## 2022-08-14 DIAGNOSIS — E1122 Type 2 diabetes mellitus with diabetic chronic kidney disease: Secondary | ICD-10-CM

## 2022-08-14 DIAGNOSIS — K5989 Other specified functional intestinal disorders: Secondary | ICD-10-CM

## 2022-08-14 DIAGNOSIS — G629 Polyneuropathy, unspecified: Secondary | ICD-10-CM

## 2022-08-14 DIAGNOSIS — I214 Non-ST elevation (NSTEMI) myocardial infarction: Secondary | ICD-10-CM | POA: Diagnosis not present

## 2022-08-14 DIAGNOSIS — K3184 Gastroparesis: Secondary | ICD-10-CM | POA: Diagnosis not present

## 2022-08-14 DIAGNOSIS — Z515 Encounter for palliative care: Secondary | ICD-10-CM | POA: Diagnosis not present

## 2022-08-14 DIAGNOSIS — I252 Old myocardial infarction: Secondary | ICD-10-CM | POA: Diagnosis not present

## 2022-08-14 DIAGNOSIS — N189 Chronic kidney disease, unspecified: Secondary | ICD-10-CM

## 2022-08-14 DIAGNOSIS — R112 Nausea with vomiting, unspecified: Secondary | ICD-10-CM | POA: Diagnosis not present

## 2022-08-14 DIAGNOSIS — I2510 Atherosclerotic heart disease of native coronary artery without angina pectoris: Secondary | ICD-10-CM | POA: Diagnosis not present

## 2022-08-14 DIAGNOSIS — I130 Hypertensive heart and chronic kidney disease with heart failure and stage 1 through stage 4 chronic kidney disease, or unspecified chronic kidney disease: Secondary | ICD-10-CM | POA: Diagnosis not present

## 2022-08-14 DIAGNOSIS — I5020 Unspecified systolic (congestive) heart failure: Secondary | ICD-10-CM

## 2022-08-14 DIAGNOSIS — Z7189 Other specified counseling: Secondary | ICD-10-CM | POA: Diagnosis not present

## 2022-08-14 DIAGNOSIS — I469 Cardiac arrest, cause unspecified: Secondary | ICD-10-CM | POA: Diagnosis not present

## 2022-08-14 LAB — COMPREHENSIVE METABOLIC PANEL
ALT: 24 U/L (ref 0–44)
ALT: 33 U/L (ref 0–44)
AST: 79 U/L — ABNORMAL HIGH (ref 15–41)
AST: 99 U/L — ABNORMAL HIGH (ref 15–41)
Albumin: 2.2 g/dL — ABNORMAL LOW (ref 3.5–5.0)
Albumin: 2.2 g/dL — ABNORMAL LOW (ref 3.5–5.0)
Alkaline Phosphatase: 39 U/L (ref 38–126)
Alkaline Phosphatase: 49 U/L (ref 38–126)
Anion gap: 10 (ref 5–15)
Anion gap: 18 — ABNORMAL HIGH (ref 5–15)
BUN: 16 mg/dL (ref 8–23)
BUN: 15 mg/dL (ref 8–23)
CO2: 20 mmol/L — ABNORMAL LOW (ref 22–32)
CO2: 18 mmol/L — ABNORMAL LOW (ref 22–32)
Calcium: 8.9 mg/dL (ref 8.9–10.3)
Calcium: 8.7 mg/dL — ABNORMAL LOW (ref 8.9–10.3)
Chloride: 107 mmol/L (ref 98–111)
Chloride: 102 mmol/L (ref 98–111)
Creatinine, Ser: 1.37 mg/dL — ABNORMAL HIGH (ref 0.61–1.24)
Creatinine, Ser: 1.5 mg/dL — ABNORMAL HIGH (ref 0.61–1.24)
GFR, Estimated: 54 mL/min — ABNORMAL LOW (ref >60)
GFR, Estimated: 49 mL/min — ABNORMAL LOW (ref >60)
Glucose, Bld: 166 mg/dL — ABNORMAL HIGH (ref 70–99)
Glucose, Bld: 209 mg/dL — ABNORMAL HIGH (ref 70–99)
Potassium: 4.4 mmol/L (ref 3.5–5.1)
Potassium: 4.4 mmol/L (ref 3.5–5.1)
Sodium: 137 mmol/L (ref 135–145)
Sodium: 138 mmol/L (ref 135–145)
Total Bilirubin: 0.7 mg/dL (ref 0.3–1.2)
Total Bilirubin: 0.9 mg/dL (ref 0.3–1.2)
Total Protein: 5.9 g/dL — ABNORMAL LOW (ref 6.5–8.1)
Total Protein: 5.6 g/dL — ABNORMAL LOW (ref 6.5–8.1)

## 2022-08-14 LAB — CBC
HCT: 37.8 % — ABNORMAL LOW (ref 39.0–52.0)
HCT: 43.9 % (ref 39.0–52.0)
Hemoglobin: 12.3 g/dL — ABNORMAL LOW (ref 13.0–17.0)
Hemoglobin: 13.5 g/dL (ref 13.0–17.0)
MCH: 29.6 pg (ref 26.0–34.0)
MCH: 29.9 pg (ref 26.0–34.0)
MCHC: 32.5 g/dL (ref 30.0–36.0)
MCHC: 30.8 g/dL (ref 30.0–36.0)
MCV: 91.1 fL (ref 80.0–100.0)
MCV: 97.1 fL (ref 80.0–100.0)
Platelets: 269 10*3/uL (ref 150–400)
Platelets: 262 10*3/uL (ref 150–400)
RBC: 4.15 MIL/uL — ABNORMAL LOW (ref 4.22–5.81)
RBC: 4.52 MIL/uL (ref 4.22–5.81)
RDW: 16.1 % — ABNORMAL HIGH (ref 11.5–15.5)
RDW: 16.5 % — ABNORMAL HIGH (ref 11.5–15.5)
WBC: 10 10*3/uL (ref 4.0–10.5)
WBC: 17.8 10*3/uL — ABNORMAL HIGH (ref 4.0–10.5)
nRBC: 0 % (ref 0.0–0.2)
nRBC: 0.3 % — ABNORMAL HIGH (ref 0.0–0.2)

## 2022-08-14 LAB — PHOSPHORUS: Phosphorus: 4.2 mg/dL (ref 2.5–4.6)

## 2022-08-14 LAB — GLUCOSE, CAPILLARY
Glucose-Capillary: 144 mg/dL — ABNORMAL HIGH (ref 70–99)
Glucose-Capillary: 158 mg/dL — ABNORMAL HIGH (ref 70–99)
Glucose-Capillary: 160 mg/dL — ABNORMAL HIGH (ref 70–99)

## 2022-08-14 LAB — MAGNESIUM: Magnesium: 2.1 mg/dL (ref 1.7–2.4)

## 2022-08-14 LAB — TROPONIN I (HIGH SENSITIVITY): Troponin I (High Sensitivity): 15609 ng/L (ref <18)

## 2022-08-14 MED ORDER — MORPHINE SULFATE (PF) 2 MG/ML IV SOLN: 2.0000 mg | INTRAVENOUS | Status: DC | PRN

## 2022-08-14 MED ORDER — POLYVINYL ALCOHOL 1.4 % OP SOLN: 1.0000 [drp] | Freq: Four times a day (QID) | OPHTHALMIC | Status: DC | PRN

## 2022-08-14 MED ORDER — BIOTENE DRY MOUTH MT LIQD: 15.0000 mL | OROMUCOSAL | Status: DC | PRN

## 2022-08-14 MED ORDER — LORAZEPAM 2 MG/ML IJ SOLN
1.0000 mg | INTRAMUSCULAR | Status: DC | PRN
  Administered 2022-08-14: 2 mg via INTRAVENOUS
  Filled 2022-08-14: qty 1

## 2022-08-14 MED ORDER — GLYCOPYRROLATE 0.2 MG/ML IJ SOLN
0.4000 mg | INTRAMUSCULAR | Status: DC
  Filled 2022-08-14: qty 2

## 2022-08-14 MED ORDER — MORPHINE 100MG IN NS 100ML (1MG/ML) PREMIX INFUSION
2.0000 mg/h | INTRAVENOUS | Status: DC
  Administered 2022-08-14: 2 mg/h via INTRAVENOUS
  Filled 2022-08-14: qty 100

## 2022-08-14 MED ORDER — MORPHINE BOLUS VIA INFUSION: 1.0000 mg | INTRAVENOUS | Status: DC | PRN

## 2022-08-14 MED ORDER — METOPROLOL TARTRATE 5 MG/5ML IV SOLN: 5.0000 mg | Freq: Three times a day (TID) | INTRAVENOUS | Status: DC

## 2022-08-14 DEATH — deceased

## 2022-08-20 ENCOUNTER — Telehealth: Payer: Medicare Other | Admitting: Cardiology

## 2022-09-14 NOTE — Procedures (Signed)
Extubation Procedure Note  Patient Details:   Name: Randy Liu DOB: 07-15-1951 MRN: [id number]   Airway Documentation:    Vent end date: 08/27/2022 Vent end time: 1228   Evaluation  O2 sats:  terminal extubation  Complications: No apparent complications Patient terminal extubation tolerate procedure well. Bilateral Breath Sounds: Rhonchi, Diminished   Yes  Pt was terminally extubated per Md order.   Ronaldo Miyamoto 2022/08/27, 12:29 PM

## 2022-09-14 NOTE — Progress Notes (Signed)
Pt admitted to unit, pt's wife at bedside, pt transitioning to comfort care.

## 2022-09-14 NOTE — Consult Note (Signed)
NAME:  Randy Liu, MRN:  [id number], DOB:  25-Jul-1951, LOS: 72 ADMISSION DATE:  07/25/2022, CONSULTATION DATE: 12/1 REFERRING MD: Dr. Cruzita Lederer, CHIEF COMPLAINT: Cardiac arrest  History of Present Illness:  72 year old male with past medical history as below, which is significant for diabetes, DVT/PE on Eliquis, hypertension, multivessel CAD, ischemic cardiomyopathy, and heart failure with reduced ejection fraction.  Patient has been bedbound for the last 2 years. Not a candidate for CABG.  Had PCI recently. Admitted with nausea/vomiting, felt to have SBO but turned out to be gastroparesis/dysmotility. Managed conservatively. Diuresis started for volume overload. 12/1 patient developed cardiac arrest VF 30 mins and emergently transferred to ICU in shock. PCCM consulted.   Pertinent  Medical History   has a past medical history of Anal fissure, Asthma, Cardiomyopathy ([place]), CHF (congestive heart failure) ([place]), Coronary artery disease, Diabetes mellitus without complication ([place]), History of DVT (deep vein thrombosis), History of ulcer disease, Hyperlipidemia, Hypertension, Peripheral neuropathy, and Sleep apnea.   Significant Hospital Events: Including procedures, antibiotic start and stop dates in addition to other pertinent events     Interim History / Subjective:    Objective   Blood pressure 113/63, pulse (!) 107, temperature 98.7 F (37.1 C), temperature source Oral, resp. rate (!) 25, height 6' 1"  (1.854 m), weight 133 kg, SpO2 93 %.    Vent Mode: PRVC FiO2 (%):  [100 %] 100 % Set Rate:  [24 bmp] 24 bmp Vt Set:  [640 mL] 640 mL PEEP:  [10 cmH20] 10 cmH20   Intake/Output Summary (Last 24 hours) at August 31, 2022 1032 Last data filed at 08/31/2022 0425 Gross per 24 hour  Intake 100 ml  Output 1600 ml  Net -1500 ml   Filed Weights   08/12/22 0403 08/13/22 0400 2022-08-31 0421  Weight: (!) 136.2 kg 135 kg 133 kg    Examination: General: Obese elderly male HENT: [place]/AT,  PERRL, no JVD Lungs: Coarse crackles throughout Cardiovascular: Tachy, regular.  Abdomen: Soft, NT, ND Extremities: ACE wraps to lower extremities Neuro: Coma GU: Foley  Resolved Hospital Problem list     Assessment & Plan:   Cardiac arrest Multivessel CAD ICM HFrEF Gastroparesis PE Failure to thrive CKD DM  Patient has been in poor health for some time, bedbound for 2 years.  Palliative care had already been involved during this hospitalization and had a good relationship with the patient's wife.  The patient's and his wife had been considering DNR status and comfort care in the days leading up to the cardiac arrest on 12/1.  Palliative care was present immediately postarrest and was able to discuss with family who do not wish to provide any further aggressive care and proceed with comfort measures.  Plan - Initiate morphine infusion - Compassionate extubation per PMT protocol.  - Unrestricted visitation - No additional lab draws - Hospitality cart to bedside.    Labs   CBC: Recent Labs  Lab 08/12/22 0617 August 31, 2022 0859  WBC 6.4 10.0  HGB 11.4* 12.3*  HCT 35.4* 37.8*  MCV 92.9 91.1  PLT 238 761    Basic Metabolic Panel: Recent Labs  Lab 08/08/22 0819 08/09/22 0103 08/10/22 0051 08/11/22 0843 08/13/22 0144 08/13/22 0153 31-Aug-2022 0859  NA 137  --  137 140  --  138 137  K 3.5  --  3.4* 4.0  --  4.2 4.4  CL 105  --  107 109  --  107 107  CO2 22  --  22 22  --  19*  20*  GLUCOSE 123*  --  172* 123*  --  192* 166*  BUN 9  --  5* <5*  --  6* 16  CREATININE 1.00  --  1.07 1.08  --  1.19 1.37*  CALCIUM 8.6*  --  8.6* 8.6*  --  9.0 8.9  MG 1.5* 1.7 1.4* 1.7 1.6*  --   --    GFR: Estimated Creatinine Clearance: 68.7 mL/min (A) (by C-G formula based on SCr of 1.37 mg/dL (H)). Recent Labs  Lab 08/12/22 0617 08/22/2022 0859  WBC 6.4 10.0    Liver Function Tests: Recent Labs  Lab 08/13/22 0153 2022/08/22 0859  AST 42* 79*  ALT 19 24  ALKPHOS 43 39   BILITOT 0.5 0.7  PROT 5.8* 5.9*  ALBUMIN 2.3* 2.2*   No results for input(s): "LIPASE", "AMYLASE" in the last 168 hours. No results for input(s): "AMMONIA" in the last 168 hours.  ABG No results found for: "PHART", "PCO2ART", "PO2ART", "HCO3", "TCO2", "ACIDBASEDEF", "O2SAT"   Coagulation Profile: No results for input(s): "INR", "PROTIME" in the last 168 hours.  Cardiac Enzymes: No results for input(s): "CKTOTAL", "CKMB", "CKMBINDEX", "TROPONINI" in the last 168 hours.  HbA1C: Hgb A1c MFr Bld  Date/Time Value Ref Range Status  04/16/2022 08:32 PM 7.4 (H) 4.8 - 5.6 % Final    Comment:    (NOTE) Pre diabetes:          5.7%-6.4%  Diabetes:              >6.4%  Glycemic control for   <7.0% adults with diabetes   11/21/2021 06:38 AM 6.7 (H) 4.8 - 5.6 % Final    Comment:    (NOTE) Pre diabetes:          5.7%-6.4%  Diabetes:              >6.4%  Glycemic control for   <7.0% adults with diabetes     CBG: Recent Labs  Lab 08/13/22 1620 08/13/22 1951 2022-08-22 0008 08/22/2022 0418 08/22/2022 0755  GLUCAP 173* 153* 158* 160* 144*    Review of Systems:   Patient is encephalopathic and/or intubated. Therefore history has been obtained from chart review.    Past Medical History:  He,  has a past medical history of Anal fissure, Asthma, Cardiomyopathy ([place]), CHF (congestive heart failure) ([place]), Coronary artery disease, Diabetes mellitus without complication ([place]), History of DVT (deep vein thrombosis), History of ulcer disease, Hyperlipidemia, Hypertension, Peripheral neuropathy, and Sleep apnea.   Surgical History:   Past Surgical History:  Procedure Laterality Date   BIOPSY  12/14/2020   Procedure: BIOPSY;  Surgeon: Jackquline Denmark, MD;  Location: [facility];  Service: Endoscopy;;   BIOPSY  09/05/2021   Procedure: BIOPSY;  Surgeon: Irving Copas., MD;  Location: [facility];  Service: Gastroenterology;;   CHOLECYSTECTOMY N/A 12/17/2020   Procedure: LAPAROSCOPIC  CHOLECYSTECTOMY, LIVER BIOPSY, PRIMARY UMBILICAL HERNIA REPAIR;  Surgeon: Armandina Gemma, MD;  Location: [facility];  Service: General;  Laterality: N/A;   COLONOSCOPY WITH PROPOFOL N/A 05/08/2021   Procedure: COLONOSCOPY WITH PROPOFOL;  Surgeon: Carol Ada, MD;  [facility];  Service: Endoscopy;  Laterality: N/A;   CORONARY STENT INTERVENTION N/A 11/21/2021   Procedure: CORONARY STENT INTERVENTION;  Surgeon: Nigel Mormon, MD;  Location: [facility];  Service: Cardiovascular;  Laterality: N/A;   ENDOSCOPIC RETROGRADE CHOLANGIOPANCREATOGRAPHY (ERCP) WITH PROPOFOL N/A 12/14/2020   Procedure: ENDOSCOPIC RETROGRADE CHOLANGIOPANCREATOGRAPHY (ERCP) WITH PROPOFOL;  Surgeon: Jackquline Denmark, MD;  Location:  [facility];  Service: Endoscopy;  Laterality: N/A;   ESOPHAGOGASTRODUODENOSCOPY N/A 09/05/2021   Procedure: ESOPHAGOGASTRODUODENOSCOPY (EGD);  Surgeon: Irving Copas., MD;  Location: [facility];  Service: Gastroenterology;  Laterality: N/A;   HEMOSTASIS CLIP PLACEMENT  09/05/2021   Procedure: HEMOSTASIS CLIP PLACEMENT;  Surgeon: Irving Copas., MD;  Location: [facility];  Service: Gastroenterology;;   INTRAVASCULAR ULTRASOUND/IVUS N/A 11/21/2021   Procedure: Intravascular Ultrasound/IVUS;  Surgeon: Nigel Mormon, MD;  Location: [facility];  Service: Cardiovascular;  Laterality: N/A;   LEFT HEART CATH AND CORONARY ANGIOGRAPHY N/A 08/12/2021   Procedure: LEFT HEART CATH AND CORONARY ANGIOGRAPHY;  Surgeon: Adrian Prows, MD;  Location: [facility];  Service: Cardiovascular;  Laterality: N/A;   LEFT HEART CATH AND CORONARY ANGIOGRAPHY N/A 11/21/2021   Procedure: LEFT HEART CATH AND CORONARY ANGIOGRAPHY;  Surgeon: Nigel Mormon, MD;  Location: [facility];  Service: Cardiovascular;  Laterality: N/A;   POLYPECTOMY  05/08/2021   Procedure: POLYPECTOMY;  Surgeon: Carol Ada, MD;  [facility];  Service: Endoscopy;;   POLYPECTOMY   09/05/2021   Procedure: POLYPECTOMY;  Surgeon: Irving Copas., MD;  Location: [facility];  Service: Gastroenterology;;   REMOVAL OF STONES  12/14/2020   Procedure: REMOVAL OF STONES;  Surgeon: Jackquline Denmark, MD;  [facility];  Service: Endoscopy;;   SPHINCTEROTOMY  12/14/2020   Procedure: Joan Mayans;  Surgeon: Jackquline Denmark, MD;  [facility];  Service: Endoscopy;;     Social History:   reports that he has never smoked. He has never used smokeless tobacco. He reports that he does not currently use alcohol. He reports that he does not use drugs.   Family History:  His family history includes Cancer in his father; Heart disease in his mother; Heart failure in his mother.   Allergies Allergies  Allergen Reactions   Nsaids Other (See Comments)    Renal Insufficiency Kidney issues    Augmentin [Amoxicillin-Pot Clavulanate] Diarrhea and Nausea And Vomiting    Abdomen pain    Statins Other (See Comments)    Myalgias Myositis     Chocolate Itching    Can tolerate if taking Benadryl   Other Other (See Comments)    All nuts - told to avoid due to GI tract issues   Spiriva Respimat [Tiotropium Bromide Monohydrate] Other (See Comments)    Urinary retention   Strawberry (Diagnostic) Itching    Can tolerate if taking Benadryl   Jardiance [Empagliflozin] Other (See Comments)    Fainting Weakness  Lightheaded  Falling    Zyloprim [Allopurinol] Rash     Home Medications  Prior to Admission medications   Medication Sig Start Date End Date Taking? Authorizing Provider  albuterol (VENTOLIN HFA) 108 (90 Base) MCG/ACT inhaler Inhale 2 puffs into the lungs every 6 (six) hours as needed for wheezing or shortness of breath.   Yes [provider]  Alpha-Lipoic Acid 300 MG TABS Take 600 mg by mouth daily.   Yes [provider]  benzonatate (TESSALON) 200 MG capsule Take 200 mg by mouth every 8 (eight) hours as needed for cough. 11/18/20  Yes  [provider]  Cholecalciferol 25 MCG (1000 UT) capsule Take 4,000 Units by mouth daily.   Yes [provider]  clopidogrel (PLAVIX) 75 MG tablet TAKE ONE (1) TABLET BY MOUTH EVERY DAY 07/23/22  Yes Tolia, Sunit, DO  colchicine-probenecid 0.5-500 MG tablet Take 1 tablet by mouth daily. 11/21/20  Yes [provider]  cyanocobalamin (,VITAMIN B-12,) 1000 MCG/ML injection Inject 1,000 mcg into the skin every 30 (thirty) days. 09/23/20  Yes [provider]  ELIQUIS 5 MG TABS tablet Take 1 tablet (5 mg total) by mouth 2 (two) times daily. Resume on 05/11/21 Patient taking differently: Take 5 mg by mouth 2 (two) times daily. 05/11/21  Yes Kc, Maren Beach, MD  Evolocumab (REPATHA SURECLICK) 240 MG/ML SOAJ Inject 1 mL into the skin every 14 (fourteen) days. 04/29/22  Yes Tolia, Sunit, DO  famotidine-calcium carbonate-magnesium hydroxide (PEPCID COMPLETE) 10-800-165 MG chewable tablet Chew 1 tablet by mouth daily as needed (heartburn).   Yes [provider]  fenofibrate 160 MG tablet Take 160 mg by mouth daily. 12/04/20  Yes [provider]  fluticasone (FLONASE) 50 MCG/ACT nasal spray Place 1 spray into both nostrils daily as needed for allergies. 07/08/21  Yes [provider]  guaiFENesin-codeine 100-10 MG/5ML syrup Take 5 mLs by mouth 3 (three) times daily as needed for cough. 04/07/22  Yes [provider]  insulin aspart protamine- aspart (NOVOLOG MIX 70/30) (70-30) 100 UNIT/ML injection Inject 40-85 Units into the skin 2 (two) times daily. Sliding scale:  100-140 : 40 units  141-180 : 45 units  181-220 : 50 units  221-260 : 55 units  261-300 : 60 units  301-340 : 65 units  341-380 : 70 units  381-420 : 75 units  421-460 : 80 units  461-500 : 85 units - recheck in 1 hour and notify MD   Yes [provider]  latanoprost (XALATAN) 0.005 % ophthalmic solution Place 1 drop into the left eye at bedtime. 10/29/20  Yes [provider]  loratadine (CLARITIN) 10 MG tablet Take 10 mg by mouth daily. 01/20/22  Yes [provider]  magnesium oxide (MAG-OX) 400 MG tablet Take 1 tablet (400 mg total) by mouth 3 (three) times daily. 03/18/22  Yes Tolia, Sunit, DO  meclizine (ANTIVERT) 25 MG tablet Take 25 mg by mouth 3 (three) times daily as needed for nausea.   Yes [provider]  melatonin 5 MG TABS Take 5 mg by mouth at bedtime.   Yes [provider]  metoCLOPramide (REGLAN) 5 MG tablet Take 5 mg by mouth at bedtime. 10/09/20  Yes [provider]  metoprolol succinate (TOPROL-XL) 25 MG 24 hr tablet Take 0.5 tablets (12.5 mg total) by mouth daily. 05/07/22  Yes Tolia, Sunit, DO  montelukast (SINGULAIR) 10 MG tablet Take 10 mg by mouth at bedtime. 09/28/20  Yes [provider]  nitroGLYCERIN (NITROSTAT) 0.4 MG SL tablet Place 0.4 mg under the tongue every 5 (five) minutes as needed for chest pain.   Yes [provider]  ondansetron (ZOFRAN-ODT) 4 MG disintegrating tablet Take 1 tablet (4 mg total) by mouth every 8 (eight) hours as needed for nausea or vomiting. 09/05/21  Yes Sharen Hones, MD  pantoprazole (PROTONIX) 40 MG tablet Take 1 tablet (40 mg total) by mouth 2 (two) times daily. 09/05/21  Yes Sharen Hones, MD  polyethylene glycol (MIRALAX / GLYCOLAX) 17 g packet Take 17 g by mouth 2 (two) times daily. Patient taking differently: Take 17 g by mouth 2 (two) times daily as needed for mild constipation. 11/27/21  Yes Pokhrel, L[place]man, MD  Probiotic Product (ALIGN) 4 MG CAPS Take 4 mg by mouth daily.   Yes [provider]  senna (SENOKOT) 8.6 MG TABS tablet Take 1 tablet by mouth daily. 07/03/22  Yes [provider]  sucralfate (CARAFATE) 1 GM/10ML  suspension Take 10 mLs (1 g total) by mouth 2 (two) times daily. 09/05/21  Yes Sharen Hones, MD  torsemide (DEMADEX) 10 MG tablet Take 0.5 tablets (5 mg total) by mouth daily. 04/15/22  Yes Tolia, Sunit, DO   VASCEPA 1 g capsule TAKE ONE TABLET BY MOUTH TWICE DAILY 04/07/22  Yes Tolia, Sunit, DO  sodium chloride (MURO 128) 5 % ophthalmic ointment Place 1 application  into both eyes at bedtime as needed for eye irritation.    [provider]     Critical care time: 30 minutes     Georgann Housekeeper, AGACNP-BC [place]  See Amion for personal pager PCCM on call pager [phone] until 7pm. Please call Elink 7p-7a. [phone]  09-11-2022 10:44 AM

## 2022-09-14 NOTE — Progress Notes (Signed)
   09-11-2022 0945  Clinical Encounter Type  Visited With Family;Health care provider  Visit Type Code;Critical Care;Patient actively dying;Spiritual support;Initial  Referral From Physician Collene Gobble)  Consult/Referral To Chaplain Melvenia Beam)  Recommendations CARDIAC ARREST  Spiritual Encounters  Spiritual Needs Prayer;Emotional;Grief support   Chaplain responded to Cardiac Arrest page for Mr. Randy Liu. Met patient's wife: Randy Liu in hallway. Chaplain provided meaningful presence and space that invited Randy Liu to express her emotion and share her anticipatory grief through reflection - telling stories about her husband and their fifty-years of marriage. Chaplain accompanied Randy Liu to 60M and remained with during the ending hours of his life. Chaplain provided hospitality that offered comfort and resiliency to Randy Liu as she cared for her husband.  At [facility] request Chaplain provided Prayers for reassurance, comfort, mercy, and grace. At [facility] and [facility] request, he was transitioned to comfort care and compassionately extubated. Randy Liu's sister and nephew were able to arrive and provided additional support during end of life. Chaplain stood with family through [name] death. Randy Liu was given Patient Placement Card.  Randy Liu requests Randy Liu's body be released to:  [facility] [place], [place] [address] Tel: [phone]

## 2022-09-14 NOTE — Progress Notes (Addendum)
PROGRESS NOTE  Randy Liu XOV:[id number] DOB: 1951/09/03 DOA: 08/04/2022 PCP: [name], MD   LOS: 12 days   Brief Narrative / Interim history: 72 year old male with history of DM 2, DVT/PE on Eliquis, HTN, systolic CHF, ischemic cardiomyopathy with underlying CAD and multiple non-STEMI's over the past several months, significant debility being bedbound for the past 2 years comes into the hospital with nausea and vomiting.  There was concern for small bowel obstruction and surgery was consulted.  Clinically improved but then worsened again, felt not to have SBO but gastroparesis/dysmotility.  Felt to be fluid overloaded on 11/29 and started on IV Lasix.  GI following  Subjective / 24h Interval events: He wakes up easily, appears a bit sleepy but denies any complaints.  Denies any shortness of breath, no abdominal pain, no nausea or vomiting.  Wife is at bedside and reports that he has been having intermittent elevated heart rates into the 140s.  She states that he is still coughing throughout the night.  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  Please refer to code flowsheet, but eventually achieved ROSC and has been moved to the ICU.  Discussed with PCCM,  Dr. Lamonte Sakai.  Also updated Dr. Einar Gip with cardiology.  Wife was updated, possibly cardiac in origin given V-fib.  Principal problem Recurrent nausea and vomiting -Initially treated conservative for small bowel obstruction with NG tube, NPO.  General surgery consulted and followed.  This improved, his NG tube was discontinued, his diet was advanced and started having BMs.  Unfortunately had recurrent symptoms without obstruction or ileus, and gastroenterology was consulted.  He was placed on Reglan and NG tube was reinserted 11/29.  He seems to have tolerated clamping of his NG tube since yesterday.  Hopefully GI will let him eat a little bit today, he has no nausea or vomiting this morning.  Continue conservative management -Continue IV Reglan  Active problems Acute on chronic chronic systolic CHF -patient was on IV fluids for about 10 days, and he still is significantly fluid overloaded.  He needs a degree of diuresis, continue furosemide today.  2D echo was done on 11/21, showing an EF of 45 to 00%, grade 2 diastolic dysfunction  CAD with prior PCI, Demand ischemia -No chest pain or shortness of breath.  Cardiology consulted, Dr. Einar Gip evaluated patient on 11/19, patient is asymptomatic and was on heparin , stopped after 48 hours.  He was on Eliquis but due to n.p.o. now he is on Lovenox.  He is not a candidate for cardiac evaluation given profound illness, essentially bedbound.  Continue scheduled IV metoprolol for now.  Intermittent bursts of sinus tach in the 140s but self-correcting into the 90s to 100, not associated with hypotension, he is asymptomatic, continue  to monitor.  Hopefully if he tolerates p.o. intake and GI recommends NG tube removal he could be transitioned back to his oral metoprolol   Goals of care-patient appears to be significantly debilitated, completely bedbound for close to 2 years and has not walked in over 3 years, with multiple medical problems, severe three-vessel CAD, CHF  and ongoing failure to thrive, third spacing.  Palliative care consulted, remains full code/full scope.   History of GI ulcers s/p duodenal polyp clipping- Continue IV Protonix.  Hemoglobin stable.   H/o DVT/PE -On Eliquis at baseline, currently on full dose Lovenox  Essential hypertension -Continue current medications.  Dyslipidemia/statin intolerance-Fenofibrate on hold.   Stage IIIa CKD-Stable at baseline.  Sleep apnea -on CPAP at night at home.   Hypokalemia/hypomagnesemia-Repleted  Type 2 diabetes mellitus-Blood sugar controlled.  Continue Semglee 25 units and SSI.   CBG (last 3)  Recent Labs    08/26/2022 0008 08-26-22 0418 08-26-22 0755  GLUCAP 158* 160* 144*    Scheduled Meds:  benzonatate  200 mg Oral TID   enoxaparin (LOVENOX) injection  140 mg Subcutaneous Q12H   furosemide  40 mg Intravenous BID   guaiFENesin-dextromethorphan  5 mL Oral Q8H   insulin aspart  0-9 Units Subcutaneous Q4H   insulin glargine-yfgn  25 Units Subcutaneous Daily   latanoprost  1 drop Left Eye QHS   metoCLOPramide (REGLAN) injection  10 mg Intravenous TID AC   metoprolol tartrate  5 mg Intravenous Q8H   multivitamin with minerals  1 tablet Oral Daily   pantoprazole  40 mg Oral BID   saccharomyces boulardii  250 mg Oral BID   senna  1 tablet Oral Daily   Continuous Infusions: PRN Meds:.acetaminophen, alum & mag hydroxide-simeth, guaiFENesin-dextromethorphan **AND** guaiFENesin-dextromethorphan, nitroGLYCERIN, ondansetron (ZOFRAN) IV, phenol, traZODone  Current Outpatient Medications  Medication Instructions   albuterol (VENTOLIN HFA) 108 (90 Base) MCG/ACT inhaler 2 puffs, Inhalation, Every 6 hours PRN   Align 4 mg, Oral, Daily   Alpha-Lipoic Acid 600 mg, Oral, Daily   benzonatate (TESSALON) 200 mg, Oral, Every 8 hours PRN   Cholecalciferol 4,000 Units, Oral, Daily   clopidogrel (PLAVIX) 75 MG tablet TAKE ONE (1) TABLET BY MOUTH EVERY DAY   colchicine-probenecid 0.5-500 MG tablet 1  tablet, Oral, Daily   cyanocobalamin (VITAMIN B12) 1,000 mcg, Subcutaneous, Every 30 days   Eliquis 5 mg, Oral, 2 times daily, Resume on 05/11/21   Evolocumab (REPATHA SURECLICK) 003 MG/ML SOAJ 1 mL, Subcutaneous, Every 14 days   famotidine-calcium carbonate-magnesium hydroxide (PEPCID COMPLETE) 10-800-165 MG chewable tablet 1 tablet, Oral, Daily PRN   fenofibrate 160 mg, Oral, Daily   fluticasone (FLONASE) 50 MCG/ACT nasal spray 1 spray, Each Nare, Daily PRN   guaiFENesin-codeine 100-10 MG/5ML syrup 5 mLs, Oral, 3 times daily PRN   insulin aspart protamine- aspart (NOVOLOG MIX 70/30) (70-30) 100 UNIT/ML injection 40-85 Units, Subcutaneous, 2 times daily, Sliding scale: <BR>100-140 : 40 units <BR>141-180 : 45 units <BR>181-220 : 50 units <BR>221-260 : 55 units <BR>261-300 : 60 units <BR>301-340 : 65 units <BR>341-380 : 70 units <BR>381-420 : 75 units <BR>421-460 : 80 units <BR>461-500 : 85 units - recheck in 1 hour and notify MD<BR>   latanoprost (XALATAN) 0.005 % ophthalmic solution 1 drop, Left Eye, Daily at bedtime   loratadine (CLARITIN) 10 mg, Oral, Daily   magnesium oxide (MAG-OX) 400 mg, Oral, 3 times daily   meclizine (ANTIVERT) 25 mg, Oral, 3 times daily PRN   melatonin 5 mg, Oral, Daily at bedtime  metoCLOPramide (REGLAN) 5 mg, Oral, Daily at bedtime   metoprolol succinate (TOPROL-XL) 12.5 mg, Oral, Daily   montelukast (SINGULAIR) 10 mg, Oral, Daily at bedtime   nitroGLYCERIN (NITROSTAT) 0.4 mg, Sublingual, Every 5 min PRN   ondansetron (ZOFRAN-ODT) 4 mg, Oral, Every 8 hours PRN   pantoprazole (PROTONIX) 40 mg, Oral, 2 times daily   polyethylene glycol (MIRALAX / GLYCOLAX) 17 g, Oral, 2 times daily   senna (SENOKOT) 8.6 MG TABS tablet 1 tablet, Oral, Daily   sodium chloride (MURO 128) 5 % ophthalmic ointment 1 application , Both Eyes, At bedtime PRN   sucralfate (CARAFATE) 1 g, Oral, 2 times daily   torsemide (DEMADEX) 5 mg, Oral, Daily   Vascepa 1 g, Oral, 2 times daily    Diet  Orders (From admission, onward)     Start     Ordered   08/13/22 0732  Diet NPO time specified Except for: Ice Chips  Diet effective now       Comments: Limit ice chips to no more than a cup per 24h, perhaps offer 1/5 of a cup at a time  Question:  Except for  Answer:  Ice Chips   08/13/22 0732           DVT prophylaxis: SCDs Start: 08/02/22 0454   Lab Results  Component Value Date   PLT 269 08/20/22      Code Status: Full Code  Family Communication: Wife at bedside  Status is: Inpatient  Remains inpatient appropriate because: NPO, NG tube  Level of care: Telemetry Medical  Consultants:  Cardiology Surgery GI Palliative  Objective: Vitals:   2022/08/20 0013 20-Aug-2022 0421 08/20/2022 0500 08/20/22 0800  BP: 123/60 (!) 116/58  113/63  Pulse: (!) 110 (!) 109  (!) 107  Resp: (!) 24 20  (!) 25  Temp: 98.5 F (36.9 C) (!) 101.7 F (38.7 C) 98.7 F (37.1 C)   TempSrc: Oral Axillary Oral   SpO2: 93% 93%  93%  Weight:  133 kg    Height:        Intake/Output Summary (Last 24 hours) at 08/20/2022 9242 Last data filed at 08/20/2022 0425 Gross per 24 hour  Intake 100 ml  Output 1600 ml  Net -1500 ml    Wt Readings from Last 3 Encounters:  August 20, 2022 133 kg  04/27/22 126.6 kg  04/19/22 127 kg    Examination:  Constitutional: NAD Eyes: lids and conjunctivae normal, no scleral icterus ENMT: mmm Neck: normal, supple Respiratory: Gurgling breath sounds, no wheezing Cardiovascular: Regular rate and rhythm, no murmurs / rubs / gallops. No LE edema. Abdomen: soft, no distention, no tenderness. Bowel sounds positive.  Skin: no rashes  Data Reviewed: I have independently reviewed following labs and imaging studies   CBC Recent Labs  Lab 08/12/22 0617 August 20, 2022 0859  WBC 6.4 10.0  HGB 11.4* 12.3*  HCT 35.4* 37.8*  PLT 238 269  MCV 92.9 91.1  MCH 29.9 29.6  MCHC 32.2 32.5  RDW 15.7* 16.1*     Recent Labs  Lab 08/08/22 0819 08/09/22 0103 08/10/22 0051  08/11/22 0843 08/13/22 0144 08/13/22 0153 08/20/22 0859  NA 137  --  137 140  --  138 137  K 3.5  --  3.4* 4.0  --  4.2 4.4  CL 105  --  107 109  --  107 107  CO2 22  --  22 22  --  19* 20*  GLUCOSE 123*  --  172* 123*  --  192* 166*  BUN 9  --  5* <5*  --  6* 16  CREATININE 1.00  --  1.07 1.08  --  1.19 1.37*  CALCIUM 8.6*  --  8.6* 8.6*  --  9.0 8.9  AST  --   --   --   --   --  42* 79*  ALT  --   --   --   --   --  19 24  ALKPHOS  --   --   --   --   --  43 39  BILITOT  --   --   --   --   --  0.5 0.7  ALBUMIN  --   --   --   --   --  2.3* 2.2*  MG 1.5* 1.7 1.4* 1.7 1.6*  --   --      ------------------------------------------------------------------------------------------------------------------ No results for input(s): "CHOL", "HDL", "LDLCALC", "TRIG", "CHOLHDL", "LDLDIRECT" in the last 72 hours.  Lab Results  Component Value Date   HGBA1C 7.4 (H) 04/16/2022   ------------------------------------------------------------------------------------------------------------------ No results for input(s): "TSH", "T4TOTAL", "T3FREE", "THYROIDAB" in the last 72 hours.  Invalid input(s): "FREET3"  Cardiac Enzymes No results for input(s): "CKMB", "TROPONINI", "MYOGLOBIN" in the last 168 hours.  Invalid input(s): "CK" ------------------------------------------------------------------------------------------------------------------    Component Value Date/Time   BNP 142.6 (H) 04/19/2022 0327    CBG: Recent Labs  Lab 08/13/22 1620 08/13/22 1951 2022-08-19 0008 2022/08/19 0418 Aug 19, 2022 0755  GLUCAP 173* 153* 158* 160* 144*     No results found for this or any previous visit (from the past 240 hour(s)).   Radiology Studies: No results found.   Marzetta Board, MD, PhD [facility]  Between 7 am - 7 pm I am available, please contact me via Amion (for emergencies) or Securechat (non urgent messages)  Between 7 pm - 7 am I am not available, please contact night  coverage MD/APP via Amion

## 2022-09-14 NOTE — Progress Notes (Signed)
Pt transition to comfort care, pt wife at bedside. Pt asystole on monitor, heart and lungs ausculted pt pronounced by Vinie Sill NP and myself at 1239. Pt belongings given to pt's wife.

## 2022-09-14 NOTE — Anesthesia Procedure Notes (Signed)
Procedure Name: Intubation Date/Time: Aug 19, 2022 10:04 AM  Performed by: Moshe Salisbury, CRNAPre-anesthesia Checklist: Patient identified, Emergency Drugs available, Suction available and Patient being monitored Patient Re-evaluated:Patient Re-evaluated prior to induction Oxygen Delivery Method: Circle System Utilized Preoxygenation: Pre-oxygenation with 100% oxygen Induction Type: IV induction Ventilation: Mask ventilation without difficulty Laryngoscope Size: Glidescope and 3 Tube type: Oral Tube size: 7.5 mm Number of attempts: 1 Airway Equipment and Method: Rigid stylet and Video-laryngoscopy Placement Confirmation: ETT inserted through vocal cords under direct vision, positive ETCO2, breath sounds checked- equal and bilateral and CO2 detector Secured at: 25 cm Tube secured with: Tape Dental Injury: Teeth and Oropharynx as per pre-operative assessment

## 2022-09-14 NOTE — Progress Notes (Addendum)
Palliative:  I spoke with wife. Plans for DNR and comfort care. No further testing/diagnostics desired. Allow time for family visitation. After visitation allowed [name] would like to ensure his comfort and proceed with extubation likely later today. I will follow and support through this process.   Update: I returned to bedside and discussed with [name] plans for extubation and morphine infusion. Randy Liu's sister is en-route to the hospital and we will start morphine and plan to extubate to full comfort care once she arrives to bedside.   I was present at bedside with family, RN, RT when extubated. Randy Liu was initially responsive and stated he was feeling sick on his stomach. We provided him with more medication to better ensure his comfort. He rested comfortably with family at bedside. Time of death 40. Emotional support provided to family at bedside. They are appreciative of the care he received. Appreciate chaplain support throughout this process.   Asystole on monitor, no heart/breath sounds, no respirations, no pulse at 1121.   60 min  Vinie Sill, NP Palliative Medicine Team Pager [phone] (Please see [email or web address] for schedule) Team Phone [phone]

## 2022-09-14 DEATH — deceased

## 2022-09-17 DIAGNOSIS — I82409 Acute embolism and thrombosis of unspecified deep veins of unspecified lower extremity: Secondary | ICD-10-CM | POA: Diagnosis present

## 2022-09-17 DIAGNOSIS — I4901 Ventricular fibrillation: Secondary | ICD-10-CM | POA: Diagnosis not present

## 2022-10-07 MED FILL — Medication: Qty: 1 | Status: AC

## 2022-10-12 IMAGING — DX DG ABD PORTABLE 1V
3 series · 3 of 3 positions shown · IV contrast (agent unspecified)
Comparison: Abdominal radiograph dated 05/01/2021 and CT dated
05/01/2021.

CLINICAL DATA: Small-bowel protocol.  8 hour post contrast film.

EXAM:
PORTABLE ABDOMEN - 1 VIEW

[abdomen kub (1 of 3)]
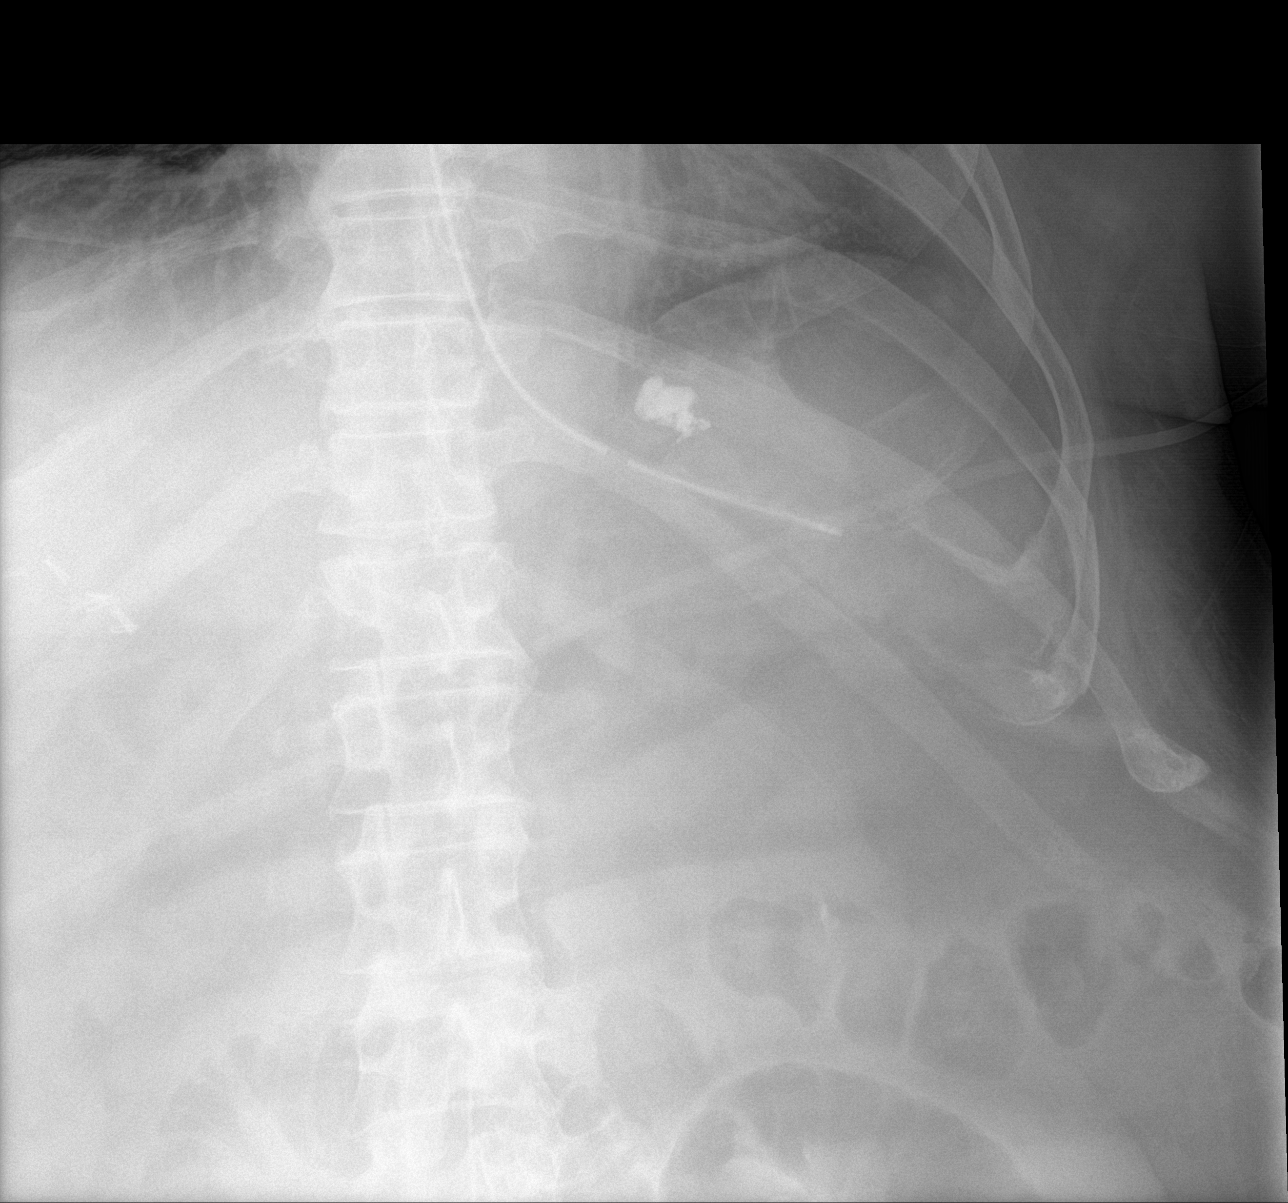

[abdomen kub (2 of 3)]
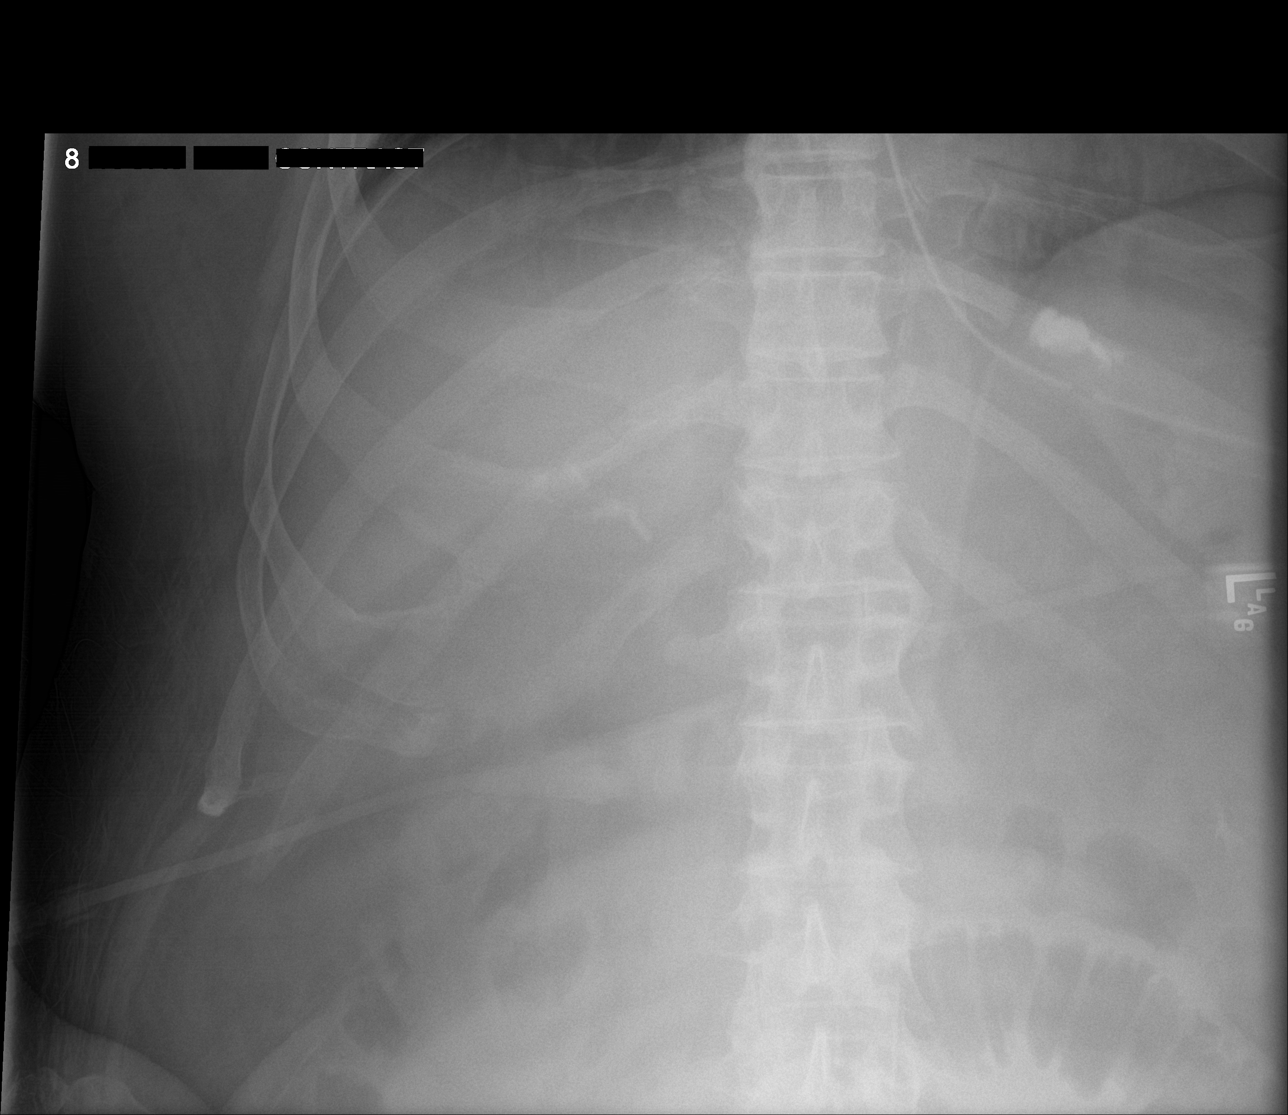

[abdomen kub (3 of 3)]
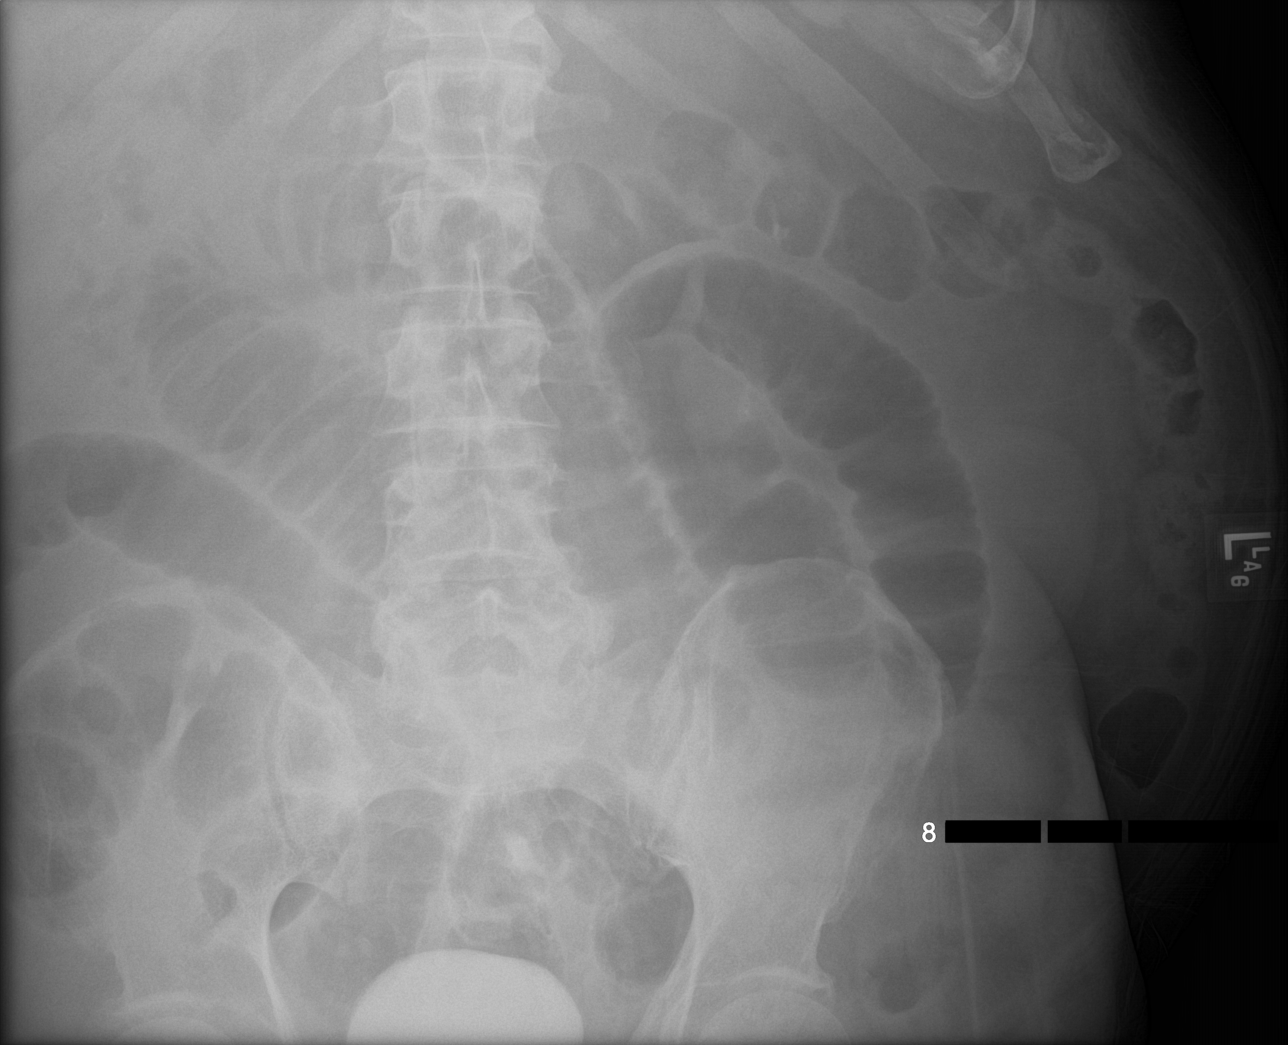

[3 of 3 positions shown; findings below may reference images not displayed]

FINDINGS: Enteric tube with tip in the left upper abdomen likely in the body
of the stomach. Persistent dilatation of air-filled loops of small
bowel in the lower abdomen measuring up to approximately 6 cm in
caliber. No free air identified. Right upper quadrant
cholecystectomy clips. Excreted contrast noted in the bladder. The
soft tissues are grossly unremarkable.
IMPRESSION: Persistent dilatation of air-filled loops of small bowel in the
lower abdomen.

## 2022-10-15 NOTE — Death Summary Note (Signed)
  DEATH SUMMARY   Patient Details  Name: Randy Liu MRN: [id number] DOB: February 24, 1951  Admission/Discharge Information   Admit Date:  Aug 25, 2022  Date of Death: Date of Death: Sep 07, 2022  Time of Death: Time of Death: 1238-01-09  Length of Stay: 18-Jan-2023  Referring Physician: [name], MD   Reason(s) for Hospitalization  Nausea, vomiting due to gastroparesis  Diagnoses  Preliminary cause of death:  Ventricular fibrillation arrest due to ischemic cardiomyopathy  Secondary Diagnoses (including complications and co-morbidities):  Principal Problem:   Cardiac arrest, cause unspecified ([place]) Active Problems:   Acute hypoxemic respiratory failure (HCC)   Diabetes ([place])   Elevated LFTs   Small bowel obstruction (HCC)   SBO (small bowel obstruction) ([place])   Type 2 diabetes mellitus with hyperlipidemia (HCC)   Hypertriglyceridemia   Nausea and vomiting   NSTEMI (non-ST elevated myocardial infarction) (HCC)   Elevated troponin   AKI (acute kidney injury) ([place])   Pure hypercholesterolemia   Stage 3a chronic kidney disease (CKD) (HCC)   Intractable nausea and vomiting   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 year old male with extensive past medical history that includes diabetes, hypertension, multivessel coronary artery disease with associated ischemic cardiomyopathy and HFrEF.  Also with history of DVT on anticoagulation, gastroparesis and GI dysmotility due to his diabetes.  He is chronically debilitated and has been bedbound for the 2 years prior to this admission.  He  underwent PCI recently but is not a candidate for CABG or other revascularization.  He was admitted for nausea and vomiting concerning for possible small bowel obstruction, ultimately ascribed to his gastroparesis and dysmotility.  He was managed conservatively.  There was some evidence for transient volume overload and he underwent diuresis.  This was associated with electrolyte abnormalities including hypokalemia, hypomagnesemia as well as some acute on chronic stage IIIa renal insufficiency.  Course also complicated by dilated troponin consistent with an acute non-ST elevation MI.  He unfortunately developed an acute VF cardiac arrest 09/07/22.  He was intubated emergently and underwent 30 minutes of CPR until he obtained ROSC on high-dose pressors.  He was moved urgently to the ICU.  Based on his overall debility and chronic medical issues discussions had already been initiated regarding the patient's goals for care and possible transition to comfort.  Given the acute events and his poor prognosis for meaningful recovery these discussions were continued and decision was made with family to pursue a palliative approach.  Family was able to visit and compassionate extubation was performed.  He expired on 07-Sep-2022.      Randy Liu 09/17/2022, 12:53 PM

## 2023-05-06 IMAGING — DX DG CHEST 1V PORT
1 series · 1 of 1 positions shown · non-contrast
Comparison: 11/23/2021

CLINICAL DATA: CHF.

EXAM:
PORTABLE CHEST 1 VIEW

[chest ap]
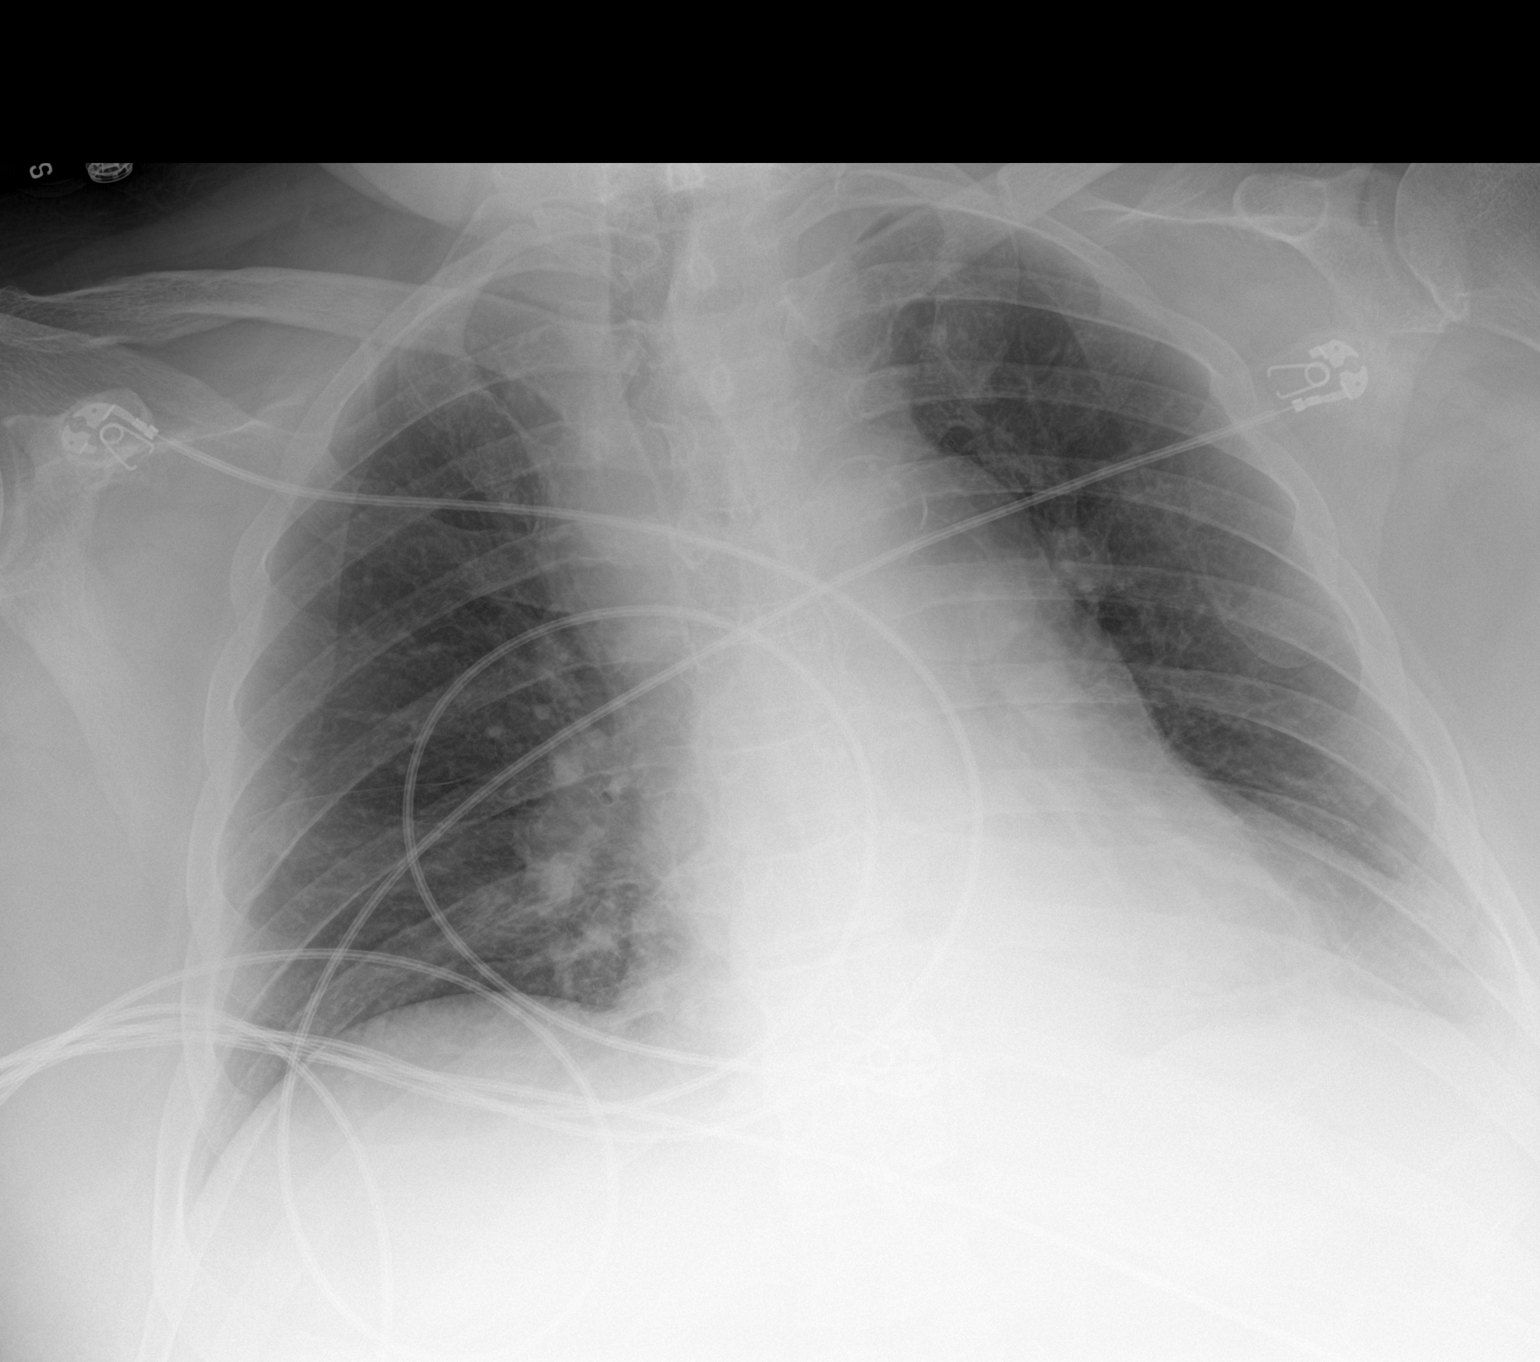

[1 of 1 positions shown; findings below may reference images not displayed]

FINDINGS: Stable cardiomediastinal contours. Low lung volumes. No pleural
effusion or edema. No airspace opacities identified. Scar versus
platelike atelectasis noted in the left base.
IMPRESSION: Scar versus platelike atelectasis in the left base.

## 2023-06-03 NOTE — Telephone Encounter (Signed)
This encounter was created in error - please disregard.
